# Patient Record
Sex: Female | Born: 1970
Health system: Southern US, Community
[De-identification: ages and names within clinical notes are randomized; demographics above are authoritative.]

## PROBLEM LIST (undated history)

## (undated) DIAGNOSIS — Z9889 Other specified postprocedural states: Secondary | ICD-10-CM

## (undated) DIAGNOSIS — R112 Nausea with vomiting, unspecified: Secondary | ICD-10-CM

## (undated) DIAGNOSIS — T4145XA Adverse effect of unspecified anesthetic, initial encounter: Secondary | ICD-10-CM

## (undated) DIAGNOSIS — M1611 Unilateral primary osteoarthritis, right hip: Secondary | ICD-10-CM

## (undated) DIAGNOSIS — F32A Depression, unspecified: Secondary | ICD-10-CM

## (undated) DIAGNOSIS — K219 Gastro-esophageal reflux disease without esophagitis: Secondary | ICD-10-CM

## (undated) DIAGNOSIS — E559 Vitamin D deficiency, unspecified: Secondary | ICD-10-CM

## (undated) DIAGNOSIS — G43909 Migraine, unspecified, not intractable, without status migrainosus: Secondary | ICD-10-CM

## (undated) DIAGNOSIS — J45909 Unspecified asthma, uncomplicated: Secondary | ICD-10-CM

## (undated) DIAGNOSIS — F329 Major depressive disorder, single episode, unspecified: Secondary | ICD-10-CM

## (undated) DIAGNOSIS — D509 Iron deficiency anemia, unspecified: Secondary | ICD-10-CM

## (undated) DIAGNOSIS — F419 Anxiety disorder, unspecified: Secondary | ICD-10-CM

## (undated) DIAGNOSIS — M1990 Unspecified osteoarthritis, unspecified site: Secondary | ICD-10-CM

## (undated) DIAGNOSIS — K635 Polyp of colon: Secondary | ICD-10-CM

## (undated) DIAGNOSIS — M51369 Other intervertebral disc degeneration, lumbar region without mention of lumbar back pain or lower extremity pain: Secondary | ICD-10-CM

## (undated) DIAGNOSIS — D649 Anemia, unspecified: Secondary | ICD-10-CM

## (undated) DIAGNOSIS — T8859XA Other complications of anesthesia, initial encounter: Secondary | ICD-10-CM

## (undated) HISTORY — DX: Depression, unspecified: F32.A

## (undated) HISTORY — PX: ABDOMINAL HYSTERECTOMY: SHX81

## (undated) HISTORY — DX: Major depressive disorder, single episode, unspecified: F32.9

## (undated) HISTORY — DX: Morbid (severe) obesity due to excess calories: E66.01

## (undated) HISTORY — DX: Unspecified asthma, uncomplicated: J45.909

## (undated) HISTORY — PX: BREAST CYST ASPIRATION: SHX578

## (undated) HISTORY — PX: LEEP: SHX91

## (undated) HISTORY — PX: ABLATION: SHX5711

## (undated) HISTORY — DX: Unspecified osteoarthritis, unspecified site: M19.90

## (undated) HISTORY — PX: TOTAL VAGINAL HYSTERECTOMY: SHX2548

---
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
## 2003-01-30 HISTORY — PX: LEEP: SHX91

## 2004-10-24 ENCOUNTER — Ambulatory Visit: Payer: Self-pay

## 2006-01-15 ENCOUNTER — Ambulatory Visit: Payer: Self-pay

## 2008-01-30 HISTORY — PX: SUPRACERVICAL ABDOMINAL HYSTERECTOMY: SHX5393

## 2008-09-01 ENCOUNTER — Ambulatory Visit: Payer: Self-pay

## 2008-09-09 ENCOUNTER — Ambulatory Visit: Payer: Self-pay

## 2008-09-12 ENCOUNTER — Emergency Department: Payer: Self-pay | Admitting: Internal Medicine

## 2011-08-07 ENCOUNTER — Emergency Department: Payer: Self-pay | Admitting: Internal Medicine

## 2011-08-07 LAB — CBC WITH DIFFERENTIAL/PLATELET
Basophil #: 0.1 10*3/uL (ref 0.0–0.1)
Basophil %: 0.6 %
Eosinophil #: 1 10*3/uL — ABNORMAL HIGH (ref 0.0–0.7)
Eosinophil %: 10.9 %
HCT: 37.4 % (ref 35.0–47.0)
HGB: 12.6 g/dL (ref 12.0–16.0)
Lymphocyte #: 2.6 10*3/uL (ref 1.0–3.6)
Lymphocyte %: 27.1 %
MCH: 28.9 pg (ref 26.0–34.0)
MCHC: 33.7 g/dL (ref 32.0–36.0)
MCV: 86 fL (ref 80–100)
Monocyte #: 0.6 x10 3/mm (ref 0.2–0.9)
Monocyte %: 6.1 %
Neutrophil #: 5.3 10*3/uL (ref 1.4–6.5)
Neutrophil %: 55.3 %
Platelet: 220 10*3/uL (ref 150–440)
RBC: 4.36 10*6/uL (ref 3.80–5.20)
RDW: 13.8 % (ref 11.5–14.5)
WBC: 9.6 10*3/uL (ref 3.6–11.0)

## 2011-08-07 LAB — BASIC METABOLIC PANEL
Anion Gap: 8 (ref 7–16)
BUN: 14 mg/dL (ref 7–18)
Calcium, Total: 9.4 mg/dL (ref 8.5–10.1)
Chloride: 106 mmol/L (ref 98–107)
Co2: 27 mmol/L (ref 21–32)
Creatinine: 0.63 mg/dL (ref 0.60–1.30)
EGFR (African American): >60
EGFR (Non-African Amer.): >60
Glucose: 88 mg/dL (ref 65–99)
Osmolality: 281 (ref 275–301)
Potassium: 3.7 mmol/L (ref 3.5–5.1)
Sodium: 141 mmol/L (ref 136–145)

## 2012-06-18 ENCOUNTER — Ambulatory Visit: Payer: Self-pay

## 2012-06-18 LAB — HM MAMMOGRAPHY: HM Mammogram: NORMAL

## 2013-11-17 ENCOUNTER — Ambulatory Visit (INDEPENDENT_AMBULATORY_CARE_PROVIDER_SITE_OTHER): Payer: BC Managed Care – PPO | Admitting: Podiatry

## 2013-11-17 ENCOUNTER — Encounter: Payer: Self-pay | Admitting: Podiatry

## 2013-11-17 ENCOUNTER — Ambulatory Visit (INDEPENDENT_AMBULATORY_CARE_PROVIDER_SITE_OTHER): Payer: BC Managed Care – PPO

## 2013-11-17 VITALS — BP 112/78 | HR 66 | Resp 16 | Ht 59.0 in | Wt 175.0 lb

## 2013-11-17 DIAGNOSIS — S93401A Sprain of unspecified ligament of right ankle, initial encounter: Secondary | ICD-10-CM

## 2013-11-17 DIAGNOSIS — R609 Edema, unspecified: Secondary | ICD-10-CM

## 2013-11-17 DIAGNOSIS — M779 Enthesopathy, unspecified: Secondary | ICD-10-CM

## 2013-11-17 MED ORDER — HYDROCODONE-ACETAMINOPHEN 10-325 MG PO TABS
1.0000 | ORAL_TABLET | Freq: Three times a day (TID) | ORAL | Status: DC | PRN
Start: 2013-11-17 — End: 2014-08-11

## 2013-11-17 MED ORDER — DICLOFENAC SODIUM 75 MG PO TBEC: 75.0000 mg | DELAYED_RELEASE_TABLET | Freq: Two times a day (BID) | ORAL | Status: DC

## 2013-11-17 NOTE — Progress Notes (Signed)
Subjective:     Patient ID: Shelby Henson, female   DOB: [birth year], 43 y.o.   MRN: [id number]  Foot Pain   patient presents stating I'm having a lot of pain in my right ankle after spraining my ankle 2 weeks ago. Stated that she turned it and that not only did her ankle her but she got bruising in her toes and those are still bothering her   Review of Systems  All other systems reviewed and are negative.      Objective:   Physical Exam  Nursing note and vitals reviewed. Constitutional: She is oriented to person, place, and time.  Cardiovascular: Intact distal pulses.   Musculoskeletal: Normal range of motion.  Neurological: She is oriented to person, place, and time.  Skin: Skin is warm.   neurovascular found to be intact with muscle strength adequate and range of motion of the subtalar and midtarsal joint within normal limits. Patient is not appear to have excessive inversion currently but has a lot of pain in the anterior talofibular ligament the calcaneofibular ligament and moderate discomfort in the forefoot right. Patient is not appear to have excessive edema at this time did have a negative Homans sign noted     Assessment:     Severe sprained ankle right with inflammatory changes and inability to bear proper weight with compensation noted    Plan:     H&P and x-rays reviewed. Today I applied a cam walker to completely immobilize and rest of the foot and ankle and advised on wearing this for the next 3 weeks and hopefully we can put her into a brace at that time. Also dispensed Ace wrap with instructions on usage and reappoint 3 weeks to reevaluate

## 2013-11-17 NOTE — Progress Notes (Signed)
   Subjective:    Patient ID: Shelby Henson, female    DOB: [birth year], 43 y.o.   MRN: [id number]  HPI Comments: [place] and twisted ankle two weeks ago and its no better   Foot Pain      Review of Systems  All other systems reviewed and are negative.      Objective:   Physical Exam        Assessment & Plan:

## 2013-12-11 ENCOUNTER — Ambulatory Visit (INDEPENDENT_AMBULATORY_CARE_PROVIDER_SITE_OTHER): Payer: BC Managed Care – PPO

## 2013-12-11 ENCOUNTER — Ambulatory Visit (INDEPENDENT_AMBULATORY_CARE_PROVIDER_SITE_OTHER): Payer: BC Managed Care – PPO | Admitting: Podiatry

## 2013-12-11 VITALS — BP 112/60 | HR 66 | Resp 16

## 2013-12-11 DIAGNOSIS — S93401D Sprain of unspecified ligament of right ankle, subsequent encounter: Secondary | ICD-10-CM

## 2013-12-11 MED ORDER — HYDROCODONE-ACETAMINOPHEN 10-325 MG PO TABS: 1.0000 | ORAL_TABLET | Freq: Three times a day (TID) | ORAL | Status: DC | PRN

## 2013-12-13 NOTE — Progress Notes (Signed)
Subjective:     Patient ID: Shelby Henson, female   DOB: August 27, 1970, 43 y.o.   MRN: [id number]  HPIpatient states my ankle is still burning some but it has improved and it's still feels moderately unstable. I'm much better in the boot   Review of Systems     Objective:   Physical Exam Neurovascular status intact muscle strength adequate with moderate inversion of the right ankle and continued discomfort in the anterior talofibular ligament and the calcaneofibular ligament when palpated    Assessment:     Ankle sprain right that is improving but is still quite tender especially with weightbearing    Plan:     I did do a final x-ray series the right ankle and reviewed with patient and today I did dispense a Tri-Lock ankle brace and instructed on utilizing the Tri-Lock at times and then the boot at other times

## 2014-01-08 ENCOUNTER — Ambulatory Visit (INDEPENDENT_AMBULATORY_CARE_PROVIDER_SITE_OTHER): Payer: BC Managed Care – PPO | Admitting: Podiatry

## 2014-01-08 ENCOUNTER — Encounter: Payer: Self-pay | Admitting: Podiatry

## 2014-01-08 DIAGNOSIS — S93401D Sprain of unspecified ligament of right ankle, subsequent encounter: Secondary | ICD-10-CM

## 2014-01-09 NOTE — Progress Notes (Signed)
Subjective:     Patient ID: Shelby Henson, female   DOB: March 23, 1970, 43 y.o.   MRN: [id number]  HPI patient states her ankle continues to improve with moderate discomfort when she's been on it for extended periods of time   Review of Systems     Objective:   Physical Exam Neurovascular status intact with improved range of motion of the right ankle and minimal edema noted and only mild discomfort upon palpation    Assessment:     Significant improvement from severe ankle sprain right    Plan:     Advised on continued brace usage and gave instructions on proprioception exercises to build up the strength around the ankle with gradual reduction in the brace over the next 2 months

## 2014-07-06 ENCOUNTER — Encounter: Payer: Self-pay | Admitting: Emergency Medicine

## 2014-07-06 ENCOUNTER — Emergency Department
Admission: EM | Admit: 2014-07-06 | Discharge: 2014-07-06 | Disposition: A | Discharge status: Home / Self Care | Payer: No Typology Code available for payment source | Attending: Emergency Medicine | Admitting: Emergency Medicine

## 2014-07-06 DIAGNOSIS — Z880 Allergy status to penicillin: Secondary | ICD-10-CM | POA: Diagnosis not present

## 2014-07-06 DIAGNOSIS — X58XXXA Exposure to other specified factors, initial encounter: Secondary | ICD-10-CM | POA: Diagnosis not present

## 2014-07-06 DIAGNOSIS — G8929 Other chronic pain: Secondary | ICD-10-CM | POA: Diagnosis not present

## 2014-07-06 DIAGNOSIS — S39012A Strain of muscle, fascia and tendon of lower back, initial encounter: Secondary | ICD-10-CM | POA: Diagnosis not present

## 2014-07-06 DIAGNOSIS — Z79899 Other long term (current) drug therapy: Secondary | ICD-10-CM | POA: Diagnosis not present

## 2014-07-06 DIAGNOSIS — Y998 Other external cause status: Secondary | ICD-10-CM | POA: Insufficient documentation

## 2014-07-06 DIAGNOSIS — Y9289 Other specified places as the place of occurrence of the external cause: Secondary | ICD-10-CM | POA: Insufficient documentation

## 2014-07-06 DIAGNOSIS — Y9389 Activity, other specified: Secondary | ICD-10-CM | POA: Insufficient documentation

## 2014-07-06 DIAGNOSIS — Z791 Long term (current) use of non-steroidal anti-inflammatories (NSAID): Secondary | ICD-10-CM | POA: Insufficient documentation

## 2014-07-06 DIAGNOSIS — M25551 Pain in right hip: Secondary | ICD-10-CM | POA: Insufficient documentation

## 2014-07-06 HISTORY — DX: Migraine, unspecified, not intractable, without status migrainosus: G43.909

## 2014-07-06 MED ORDER — KETOROLAC TROMETHAMINE 60 MG/2ML IM SOLN
INTRAMUSCULAR | Status: AC
  Administered 2014-07-06: 60 mg via INTRAMUSCULAR
  Filled 2014-07-06: qty 2

## 2014-07-06 MED ORDER — HYDROCODONE-ACETAMINOPHEN 5-325 MG PO TABS: 1.0000 | ORAL_TABLET | ORAL | Status: DC | PRN

## 2014-07-06 MED ORDER — PREDNISONE 10 MG PO TABS: 10.0000 mg | ORAL_TABLET | Freq: Two times a day (BID) | ORAL | Status: DC

## 2014-07-06 MED ORDER — DIAZEPAM 2 MG PO TABS: 2.0000 mg | ORAL_TABLET | Freq: Three times a day (TID) | ORAL | Status: DC | PRN

## 2014-07-06 MED ORDER — KETOROLAC TROMETHAMINE 60 MG/2ML IM SOLN
60.0000 mg | Freq: Once | INTRAMUSCULAR | Status: AC
  Administered 2014-07-06: 60 mg via INTRAMUSCULAR

## 2014-07-06 NOTE — ED Notes (Signed)
Pt presents to ED with right hip pain for approx 1 month and was seen by pcp and ortho. Pt states she was given steroid injection, pain medication, and told to return in 2 months. Pt states pain medication is making her nauseated and she is unable to sleep due to her pain. Pt states she is wanting something else done for her pain because she "cant wait" until her next appt to get relief.

## 2014-07-06 NOTE — ED Provider Notes (Signed)
[facility] Emergency Department Provider Note ____________________________________________  Time seen: 2159  I have reviewed the triage vital signs and the nursing notes.  HISTORY  Chief Complaint Hip Pain  HPI Shelby Henson is a 44 y.o. female who reports to the ED with complaints of chronic hip pain for months. She is been treated previously by her primary care provider with a right bursitis steroid injection. And then referred to Dr. Sharyne Richters for further evaluation. He diagnosed her with a pinched lumbar nerve and some hip joint arthropathy. He previously prescribed oxycodone for her and she has since completed that regimen. She claims she has had a difficult time getting rescheduled with his office here at [facility], noting that his primary practice location is in their [place] office. She denies any recent injury, fall, leg weakness, foot drop or give way. She describes increased pain to the right buttocks and thigh, that is not relieved with Tylenol and Motrin or her Flexeril.  Past Medical History  Diagnosis Date  . Migraine    There are no active problems to display for this patient.  Past Surgical History  Procedure Laterality Date  . Abdominal hysterectomy    . Cesarean section    . Cesarean section     Current Outpatient Rx  Name  Route  Sig  Dispense  Refill  . desvenlafaxine (PRISTIQ) 100 MG 24 hr tablet   Oral   Take 100 mg by mouth daily.         . diazepam (VALIUM) 2 MG tablet   Oral   Take 1 tablet (2 mg total) by mouth every 8 (eight) hours as needed for muscle spasms.   10 tablet   0   . diclofenac (VOLTAREN) 75 MG EC tablet   Oral   Take 1 tablet (75 mg total) by mouth 2 (two) times daily.   50 tablet   2   . HYDROcodone-acetaminophen (NORCO) 10-325 MG per tablet   Oral   Take 1 tablet by mouth every 8 (eight) hours as needed.   30 tablet   0   . HYDROcodone-acetaminophen (NORCO) 10-325 MG per tablet   Oral  Take 1 tablet by mouth every 8 (eight) hours as needed.   30 tablet   0   . HYDROcodone-acetaminophen (NORCO) 5-325 MG per tablet   Oral   Take 1 tablet by mouth every 4 (four) hours as needed for moderate pain.   6 tablet   0   . predniSONE (DELTASONE) 10 MG tablet   Oral   Take 1 tablet (10 mg total) by mouth 2 (two) times daily with a meal.   10 tablet   0    Allergies Codeine and Penicillins  No family history on file.  Social History History  Substance Use Topics  . Smoking status: Never Smoker   . Smokeless tobacco: Never Used  . Alcohol Use: No   Review of Systems  Constitutional: Negative for fever. Eyes: Negative for visual changes. ENT: Negative for sore throat. Cardiovascular: Negative for chest pain. Respiratory: Negative for shortness of breath. Gastrointestinal: Negative for abdominal pain, vomiting and diarrhea. Genitourinary: Negative for dysuria, incontinence Musculoskeletal: Positive for right low back pain and hip pain. Skin: Negative for rash. Neurological: Negative for headaches, focal weakness or numbness. ____________________________________________  PHYSICAL EXAM:  VITAL SIGNS: ED Triage Vitals  Enc Vitals Group     BP 07/06/14 2039 129/78 mmHg     Pulse Rate 07/06/14 2039 83  Resp 07/06/14 2039 18     Temp 07/06/14 2039 97.8 F (36.6 C)     Temp Source 07/06/14 2039 Oral     SpO2 07/06/14 2039 97 %     Weight 07/06/14 2039 165 lb (74.844 kg)     Height 07/06/14 2039 4\' 11"  (1.499 m)     Head Cir --      Peak Flow --      Pain Score 07/06/14 2039 8     Pain Loc --      Pain Edu? --      Excl. in [place]? --    Constitutional: Alert and oriented. Well appearing and in no distress. Eyes: Conjunctivae are normal. PERRL. Normal extraocular movements. ENT   Head: Normocephalic and atraumatic.   Nose: No congestion/rhinnorhea.   Mouth/Throat: Mucous membranes are moist.   Neck: No stridor. Cardiovascular: Normal rate,  regular rhythm.  Respiratory: Normal respiratory effort. No wheezes/rales/rhonchi. Gastrointestinal: Soft and nontender. No distention. Musculoskeletal: Nontender with normal range of motion in all extremities. No lower extremity tenderness nor edema. Full right hip ROM without catch, click,or lock Neurologic:  Normal speech and language. No gross focal neurologic deficits are appreciated. Normal DTRs LE bilaterally. Negative SLR and toe dorsiflexion bilaterally. Skin:  Skin is warm, dry and intact. No rash noted. Psychiatric: Mood and affect are normal. Patient exhibits appropriate insight and judgment. ____________________________________________  PROCEDURES Toradol 60 mg IM ____________________________________________  INITIAL IMPRESSION / ASSESSMENT AND PLAN / ED COURSE Chronic LBP with RLE referral. Prescription steroid, valium, and Norco for pain relief. Patient must follow-up with primary provider if unable to secure return visit with ortho specialist. ____________________________________________  FINAL CLINICAL IMPRESSION(S) / ED DIAGNOSES  Final diagnoses:  Hip pain, chronic, right  Strain, lumbosacral, initial encounter     Melvenia Needles, PA-C 07/06/14 2344  Delman Kitten, MD 07/06/14 2357

## 2014-07-06 NOTE — Discharge Instructions (Signed)
Chronic Back Pain  When back pain lasts longer than 3 months, it is called chronic back pain.People with chronic back pain often go through certain periods that are more intense (flare-ups).  CAUSES Chronic back pain can be caused by wear and tear (degeneration) on different structures in your back. These structures include:  The bones of your spine (vertebrae) and the joints surrounding your spinal cord and nerve roots (facets).  The strong, fibrous tissues that connect your vertebrae (ligaments). Degeneration of these structures may result in pressure on your nerves. This can lead to constant pain. HOME CARE INSTRUCTIONS  Avoid bending, heavy lifting, prolonged sitting, and activities which make the problem worse.  Take brief periods of rest throughout the day to reduce your pain. Lying down or standing usually is better than sitting while you are resting.  Take over-the-counter or prescription medicines only as directed by your caregiver. SEEK IMMEDIATE MEDICAL CARE IF:   You have weakness or numbness in one of your legs or feet.  You have trouble controlling your bladder or bowels.  You have nausea, vomiting, abdominal pain, shortness of breath, or fainting. Document Released: 02/23/2004 Document Revised: 04/09/2011 Document Reviewed: 12/30/2010 [facility] Patient Information 2015 [place], [place]. This information is not intended to replace advice given to you by your health care provider. Make sure you discuss any questions you have with your health care provider.  Hip Pain Your hip is the joint between your upper legs and your lower pelvis. The bones, cartilage, tendons, and muscles of your hip joint perform a lot of work each day supporting your body weight and allowing you to move around. Hip pain can range from a minor ache to severe pain in one or both of your hips. Pain may be felt on the inside of the hip joint near the groin, or the outside near the buttocks and upper thigh.  You may have swelling or stiffness as well.  HOME CARE INSTRUCTIONS   Take medicines only as directed by your health care provider.  Apply ice to the injured area:  Put ice in a plastic bag.  Place a towel between your skin and the bag.  Leave the ice on for 15-20 minutes at a time, 3-4 times a day.  Keep your leg raised (elevated) when possible to lessen swelling.  Avoid activities that cause pain.  Follow specific exercises as directed by your health care provider.  Sleep with a pillow between your legs on your most comfortable side.  Record how often you have hip pain, the location of the pain, and what it feels like. SEEK MEDICAL CARE IF:   You are unable to put weight on your leg.  Your hip is red or swollen or very tender to touch.  Your pain or swelling continues or worsens after 1 week.  You have increasing difficulty walking.  You have a fever. SEEK IMMEDIATE MEDICAL CARE IF:   You have fallen.  You have a sudden increase in pain and swelling in your hip. MAKE SURE YOU:   Understand these instructions.  Will watch your condition.  Will get help right away if you are not doing well or get worse. Document Released: 07/05/2009 Document Revised: 06/01/2013 Document Reviewed: 09/11/2012 [facility] Patient Information 2015 [place], [place]. This information is not intended to replace advice given to you by your health care provider. Make sure you discuss any questions you have with your health care provider.  Take the prescription meds as directed.  Follow-up with Dr.  Dimmig or Dr. Ancil Boozer for ongoing care.

## 2014-07-07 ENCOUNTER — Encounter: Payer: Self-pay | Admitting: Family Medicine

## 2014-07-09 ENCOUNTER — Other Ambulatory Visit: Payer: Self-pay | Admitting: Family Medicine

## 2014-07-09 NOTE — Telephone Encounter (Signed)
Patient needs refill

## 2014-08-08 ENCOUNTER — Encounter: Payer: Self-pay | Admitting: Family Medicine

## 2014-08-08 DIAGNOSIS — Q760 Spina bifida occulta: Secondary | ICD-10-CM | POA: Insufficient documentation

## 2014-08-08 DIAGNOSIS — F5104 Psychophysiologic insomnia: Secondary | ICD-10-CM | POA: Insufficient documentation

## 2014-08-08 DIAGNOSIS — Z90710 Acquired absence of both cervix and uterus: Secondary | ICD-10-CM | POA: Insufficient documentation

## 2014-08-08 DIAGNOSIS — M549 Dorsalgia, unspecified: Secondary | ICD-10-CM | POA: Insufficient documentation

## 2014-08-08 DIAGNOSIS — M1611 Unilateral primary osteoarthritis, right hip: Secondary | ICD-10-CM | POA: Insufficient documentation

## 2014-08-08 DIAGNOSIS — F39 Unspecified mood [affective] disorder: Secondary | ICD-10-CM | POA: Insufficient documentation

## 2014-08-08 DIAGNOSIS — K219 Gastro-esophageal reflux disease without esophagitis: Secondary | ICD-10-CM | POA: Insufficient documentation

## 2014-08-08 DIAGNOSIS — J4520 Mild intermittent asthma, uncomplicated: Secondary | ICD-10-CM | POA: Insufficient documentation

## 2014-08-08 DIAGNOSIS — K580 Irritable bowel syndrome with diarrhea: Secondary | ICD-10-CM | POA: Insufficient documentation

## 2014-08-08 DIAGNOSIS — M5136 Other intervertebral disc degeneration, lumbar region: Secondary | ICD-10-CM | POA: Insufficient documentation

## 2014-08-08 DIAGNOSIS — J4521 Mild intermittent asthma with (acute) exacerbation: Secondary | ICD-10-CM | POA: Insufficient documentation

## 2014-08-08 DIAGNOSIS — IMO0002 Reserved for concepts with insufficient information to code with codable children: Secondary | ICD-10-CM | POA: Insufficient documentation

## 2014-08-11 ENCOUNTER — Ambulatory Visit (INDEPENDENT_AMBULATORY_CARE_PROVIDER_SITE_OTHER): Payer: No Typology Code available for payment source | Admitting: Family Medicine

## 2014-08-11 ENCOUNTER — Encounter: Payer: Self-pay | Admitting: Family Medicine

## 2014-08-11 ENCOUNTER — Encounter (INDEPENDENT_AMBULATORY_CARE_PROVIDER_SITE_OTHER): Payer: Self-pay

## 2014-08-11 VITALS — BP 108/64 | HR 79 | Temp 98.8°F | Resp 16 | Ht 59.0 in | Wt 178.1 lb

## 2014-08-11 DIAGNOSIS — M1610 Unilateral primary osteoarthritis, unspecified hip: Secondary | ICD-10-CM | POA: Diagnosis not present

## 2014-08-11 DIAGNOSIS — J4520 Mild intermittent asthma, uncomplicated: Secondary | ICD-10-CM

## 2014-08-11 DIAGNOSIS — F329 Major depressive disorder, single episode, unspecified: Secondary | ICD-10-CM | POA: Diagnosis not present

## 2014-08-11 DIAGNOSIS — G4700 Insomnia, unspecified: Secondary | ICD-10-CM

## 2014-08-11 DIAGNOSIS — F32A Depression, unspecified: Secondary | ICD-10-CM

## 2014-08-11 DIAGNOSIS — K219 Gastro-esophageal reflux disease without esophagitis: Secondary | ICD-10-CM | POA: Diagnosis not present

## 2014-08-11 DIAGNOSIS — F5104 Psychophysiologic insomnia: Secondary | ICD-10-CM

## 2014-08-11 MED ORDER — VENLAFAXINE HCL ER 150 MG PO CP24: 150.0000 mg | ORAL_CAPSULE | Freq: Every day | ORAL | Status: DC

## 2014-08-11 NOTE — Progress Notes (Signed)
Name: Shelby Henson   MRN: [id number]    DOB: [birth year]   Date:08/11/2014       Progress Note  Subjective  Chief Complaint  Chief Complaint  Patient presents with  . Depression  . Insomnia    avg sleep 2-3hrs  . Hip Pain    right seeing Ortho    HPI  Depression: she has been taking Pritiq but was getting for free through [employer], but now she has insurance and needs to get a generic medication. She states she is in remission.  Denies mood swings, crying spells, or anhedonia  Insomnia: she is taking Trazodone prn only , only when she off from work, because she works 2nd shift and cares for grandson during the day.  Right Hip OA: she is seeing Dr. Joselyn Arrow at [facility], she is having daily pain, that affects her sleep.  Hydrocodone helps with the pain slightly.  Pain level at this time is 6/10.  She was supposed to have a steroid injection but insurance is denying coverage.    GERD: she is only taking medication prn, about 3 times weekly. Symptoms are usually epigastric pain, no nausea, symptoms triggered by poor food choices.  Asthma Mild Intermittent: she has not used rescue inhaler in about one year. Denies SOB , wheezing or cough   Patient Active Problem List   Diagnosis Date Noted  . Asthma, mild intermittent 08/08/2014  . Chronic insomnia 08/08/2014  . Back ache 08/08/2014  . DDD (degenerative disc disease), lumbar 08/08/2014  . Clinical depression 08/08/2014  . Gastro-esophageal reflux disease without esophagitis 08/08/2014  . H/O total hysterectomy 08/08/2014  . Irritable bowel syndrome with diarrhea 08/08/2014  . Degenerative arthritis of hip 08/08/2014  . Adult BMI 30+ 08/08/2014  . SBO (spina bifida occulta) 08/08/2014    Past Surgical History  Procedure Laterality Date  . Abdominal hysterectomy    . Cesarean section    . Cesarean section      Family History  Problem Relation Age of Onset  . Bipolar disorder Brother     History   Social History  .  Marital Status: Divorced    Spouse Name: N/A  . Number of Children: N/A  . Years of Education: N/A   Occupational History  . Not on file.   Social History Main Topics  . Smoking status: Never Smoker   . Smokeless tobacco: Never Used  . Alcohol Use: 0.0 oz/week    0 Standard drinks or equivalent per week     Comment: occasionally  . Drug Use: Not on file  . Sexual Activity: Not Currently   Other Topics Concern  . Not on file   Social History Narrative     Current outpatient prescriptions:  .  albuterol (PROVENTIL) (2.5 MG/3ML) 0.083% nebulizer solution, Inhale into the lungs., Disp: , Rfl:  .  HYDROcodone-acetaminophen (NORCO) 5-325 MG per tablet, Take 1 tablet by mouth every 4 (four) hours as needed for moderate pain., Disp: 6 tablet, Rfl: 0 .  ranitidine (ZANTAC) 150 MG tablet, Take 1 tablet by mouth daily., Disp: , Rfl:  .  traZODone (DESYREL) 50 MG tablet, Take 1 tablet by mouth at bedtime., Disp: , Rfl:  .  venlafaxine XR (EFFEXOR XR) 150 MG 24 hr capsule, Take 1 capsule (150 mg total) by mouth daily with breakfast., Disp: 30 capsule, Rfl: 2  Allergies  Allergen Reactions  . Prednisone     intolerance, makes her angry. Makes her angry  . Zolpidem   .  Codeine Rash  . Penicillins Rash     ROS  Constitutional: Negative for fever or weight change.  Respiratory: Negative for cough and shortness of breath.   Cardiovascular: Negative for chest pain or palpitations.  Gastrointestinal: Negative for abdominal pain, no bowel changes.  Musculoskeletal: Positive  for gait problem and  joint pain Skin: Negative for rash.  Neurological: Negative for dizziness or headache.  No other specific complaints in a complete review of systems (except as listed in HPI above).  Objective  Filed Vitals:   08/11/14 0834  BP: 108/64  Pulse: 79  Temp: 98.8 F (37.1 C)  TempSrc: Oral  Resp: 16  Height: 4\' 11"  (1.499 m)  Weight: 178 lb 1.6 oz (80.786 kg)  SpO2: 96%    Body mass  index is 35.95 kg/(m^2).  Physical Exam  Constitutional: Patient appears well-developed and well-nourished. No distress. Obesity   Eyes:  No scleral icterus. PERL Neck: Normal range of motion. Neck supple. Cardiovascular: Normal rate, regular rhythm and normal heart sounds.  No murmur heard. No BLE edema. Pulmonary/Chest: Effort normal and breath sounds normal. No respiratory distress. Abdominal: Soft.  There is no tenderness. Psychiatric: Patient has a normal mood and affect. behavior is normal. Judgment and thought content normal. Muscular Skeletal: pain during internal rotation of hip - on her groin.  Pain also during palpation of right outer hip    PHQ2/9: Depression screen [facility] 2/9 08/11/2014  Decreased Interest 0  Down, Depressed, Hopeless 0  PHQ - 2 Score 0     Fall Risk: Fall Risk  08/11/2014  Falls in the past year? Yes  Number falls in past yr: 1  Injury with Fall? Yes      Assessment & Plan  1. Chronic insomnia Continue prn medication   2. Gastro-esophageal reflux disease without esophagitis Stable with dietary modification and prn medication  3. Clinical depression We will stop Pritiq because of cost and we will try Effexor - venlafaxine XR (EFFEXOR XR) 150 MG 24 hr capsule; Take 1 capsule (150 mg total) by mouth daily with breakfast.  Dispense: 30 capsule; Refill: 2  5. Asthma, mild intermittent, uncomplicated Doing well at this time  6. Primary osteoarthritis of one hip Continue follow up with Ortho, advised strength training - quad exercises

## 2014-08-11 NOTE — Patient Instructions (Signed)
Hip Exercises RANGE OF MOTION (ROM) AND STRETCHING EXERCISES  These exercises may help you when beginning to rehabilitate your injury. Doing them too aggressively can worsen your condition. Complete them slowly and gently. Your symptoms may resolve with or without further involvement from your physician, physical therapist or athletic trainer. While completing these exercises, remember:   Restoring tissue flexibility helps normal motion to return to the joints. This allows healthier, less painful movement and activity.  An effective stretch should be held for at least 30 seconds.  A stretch should never be painful. You should only feel a gentle lengthening or release in the stretched tissue. If these stretches worsen your symptoms even when done gently, consult your physician, physical therapist or athletic trainer. STRETCH - Hamstrings, Supine   Lie on your back. Loop a belt or towel over the ball of your right / left foot.  Straighten your right / left knee and slowly pull on the belt to raise your leg. Do not allow the right / left knee to bend. Keep your opposite leg flat on the floor.  Raise the leg until you feel a gentle stretch behind your right / left knee or thigh. Hold this position for __________ seconds. Repeat __________ times. Complete this stretch __________ times per day.  STRETCH - Hip Rotators   Lie on your back on a firm surface. Grasp your right / left knee with your right / left hand and your ankle with your opposite hand.  Keeping your hips and shoulders firmly planted, gently pull your right / left knee and rotate your lower leg toward your opposite shoulder until you feel a stretch in your buttocks.  Hold this stretch for __________ seconds. Repeat this stretch __________ times. Complete this stretch __________ times per day. STRETCH - Hamstrings/Adductors, V-Sit   Sit on the floor with your legs extended in a large "V," keeping your knees straight.  With your  head and chest upright, bend at your waist reaching for your right foot to stretch your left adductors.  You should feel a stretch in your left inner thigh. Hold for __________ seconds.  Return to the upright position to relax your leg muscles.  Continuing to keep your chest upright, bend straight forward at your waist to stretch your hamstrings.  You should feel a stretch behind both of your thighs and/or knees. Hold for __________ seconds.  Return to the upright position to relax your leg muscles.  Repeat steps 2 through 4 for opposite leg. Repeat __________ times. Complete this exercise __________ times per day.  STRETCHING - Hip Flexors, Lunge  Half kneel with your right / left knee on the floor and your opposite knee bent and directly over your ankle.  Keep good posture with your head over your shoulders. Tighten your buttocks to point your tailbone downward; this will prevent your back from arching too much.  You should feel a gentle stretch in the front of your thigh and/or hip. If you do not feel any resistance, slightly slide your opposite foot forward and then slowly lunge forward so your knee once again lines up over your ankle. Be sure your tailbone remains pointed downward.  Hold this stretch for __________ seconds. Repeat __________ times. Complete this stretch __________ times per day. STRENGTHENING EXERCISES These exercises may help you when beginning to rehabilitate your injury. They may resolve your symptoms with or without further involvement from your physician, physical therapist or athletic trainer. While completing these exercises, remember:   Muscles  can gain both the endurance and the strength needed for everyday activities through controlled exercises.  Complete these exercises as instructed by your physician, physical therapist or athletic trainer. Progress the resistance and repetitions only as guided.  You may experience muscle soreness or fatigue, but the  pain or discomfort you are trying to eliminate should never worsen during these exercises. If this pain does worsen, stop and make certain you are following the directions exactly. If the pain is still present after adjustments, discontinue the exercise until you can discuss the trouble with your clinician. STRENGTH - Hip Extensors, Bridge   Lie on your back on a firm surface. Bend your knees and place your feet flat on the floor.  Tighten your buttocks muscles and lift your bottom off the floor until your trunk is level with your thighs. You should feel the muscles in your buttocks and back of your thighs working. If you do not feel these muscles, slide your feet 1-2 inches further away from your buttocks.  Hold this position for __________ seconds.  Slowly lower your hips to the starting position and allow your buttock muscles relax completely before beginning the next repetition.  If this exercise is too easy, you may cross your arms over your chest. Repeat __________ times. Complete this exercise __________ times per day.  STRENGTH - Hip Abductors, Straight Leg Raises  Be aware of your form throughout the entire exercise so that you exercise the correct muscles. Sloppy form means that you are not strengthening the correct muscles.  Lie on your side so that your head, shoulders, knee and hip line up. You may bend your lower knee to help maintain your balance. Your right / left leg should be on top.  Roll your hips slightly forward, so that your hips are stacked directly over each other and your right / left knee is facing forward.  Lift your top leg up 4-6 inches, leading with your heel. Be sure that your foot does not drift forward or that your knee does not roll toward the ceiling.  Hold this position for __________ seconds. You should feel the muscles in your outer hip lifting (you may not notice this until your leg begins to tire).  Slowly lower your leg to the starting position. Allow  the muscles to fully relax before beginning the next repetition. Repeat __________ times. Complete this exercise __________ times per day.  STRENGTH - Hip Adductors, Straight Leg Raises   Lie on your side so that your head, shoulders, knee and hip line up. You may place your upper foot in front to help maintain your balance. Your right / left leg should be on the bottom.  Roll your hips slightly forward, so that your hips are stacked directly over each other and your right / left knee is facing forward.  Tense the muscles in your inner thigh and lift your bottom leg 4-6 inches. Hold this position for __________ seconds.  Slowly lower your leg to the starting position. Allow the muscles to fully relax before beginning the next repetition. Repeat __________ times. Complete this exercise __________ times per day.  STRENGTH - Quadriceps, Straight Leg Raises  Quality counts! Watch for signs that the quadriceps muscle is working to insure you are strengthening the correct muscles and not "cheating" by substituting with healthier muscles.  Lay on your back with your right / left leg extended and your opposite knee bent.  Tense the muscles in the front of your right / left  thigh. You should see either your knee cap slide up or increased dimpling just above the knee. Your thigh may even quiver.  Tighten these muscles even more and raise your leg 4 to 6 inches off the floor. Hold for right / left seconds.  Keeping these muscles tense, lower your leg.  Relax the muscles slowly and completely in between each repetition. Repeat __________ times. Complete this exercise __________ times per day.  STRENGTH - Hip Abductors, Standing  Tie one end of a rubber exercise band/tubing to a secure surface (table, pole) and tie a loop at the other end.  Place the loop around your right / left ankle. Keeping your ankle with the band directly opposite of the secured end, step away until there is tension in the  tube/band.  Hold onto a chair as needed for balance.  Keeping your back upright, your shoulders over your hips, and your toes pointing forward, lift your right / left leg out to your side. Be sure to lift your leg with your hip muscles. Do not "throw" your leg or tip your body to lift your leg.  Slowly and with control, return to the starting position. Repeat exercise __________ times. Complete this exercise __________ times per day.  STRENGTH - Quadriceps, Squats  Stand in a door frame so that your feet and knees are in line with the frame.  Use your hands for balance, not support, on the frame.  Slowly lower your weight, bending at the hips and knees. Keep your lower legs upright so that they are parallel with the door frame. Squat only within the range that does not increase your knee pain. Never let your hips drop below your knees.  Slowly return upright, pushing with your legs, not pulling with your hands. Document Released: 02/02/2005 Document Revised: 04/09/2011 Document Reviewed: 04/29/2008 [facility] Patient Information 2015 [place], [place]. This information is not intended to replace advice given to you by your health care provider. Make sure you discuss any questions you have with your health care provider.

## 2014-09-13 ENCOUNTER — Encounter: Payer: Self-pay | Admitting: Family Medicine

## 2015-01-03 ENCOUNTER — Encounter: Payer: No Typology Code available for payment source | Admitting: Family Medicine

## 2015-04-26 LAB — HM MAMMOGRAPHY

## 2015-05-16 ENCOUNTER — Encounter: Payer: Self-pay | Admitting: Family Medicine

## 2015-05-17 ENCOUNTER — Encounter: Payer: Self-pay | Admitting: Family Medicine

## 2015-05-17 ENCOUNTER — Telehealth: Payer: Self-pay

## 2015-05-17 ENCOUNTER — Ambulatory Visit (INDEPENDENT_AMBULATORY_CARE_PROVIDER_SITE_OTHER): Payer: BLUE CROSS/BLUE SHIELD | Admitting: Family Medicine

## 2015-05-17 VITALS — BP 112/64 | HR 103 | Temp 98.1°F | Resp 18 | Ht 59.0 in | Wt 177.5 lb

## 2015-05-17 DIAGNOSIS — J302 Other seasonal allergic rhinitis: Secondary | ICD-10-CM | POA: Diagnosis not present

## 2015-05-17 DIAGNOSIS — Z131 Encounter for screening for diabetes mellitus: Secondary | ICD-10-CM | POA: Diagnosis not present

## 2015-05-17 DIAGNOSIS — Z0000 Encounter for general adult medical examination without abnormal findings: Secondary | ICD-10-CM | POA: Diagnosis not present

## 2015-05-17 DIAGNOSIS — J4521 Mild intermittent asthma with (acute) exacerbation: Secondary | ICD-10-CM

## 2015-05-17 DIAGNOSIS — N951 Menopausal and female climacteric states: Secondary | ICD-10-CM | POA: Diagnosis not present

## 2015-05-17 DIAGNOSIS — Z01419 Encounter for gynecological examination (general) (routine) without abnormal findings: Secondary | ICD-10-CM

## 2015-05-17 DIAGNOSIS — R5383 Other fatigue: Secondary | ICD-10-CM

## 2015-05-17 DIAGNOSIS — Z13220 Encounter for screening for lipoid disorders: Secondary | ICD-10-CM | POA: Diagnosis not present

## 2015-05-17 DIAGNOSIS — R232 Flushing: Secondary | ICD-10-CM

## 2015-05-17 MED ORDER — ALBUTEROL SULFATE (2.5 MG/3ML) 0.083% IN NEBU: 2.5000 mg | INHALATION_SOLUTION | Freq: Four times a day (QID) | RESPIRATORY_TRACT | Status: DC | PRN

## 2015-05-17 MED ORDER — FLUTICASONE PROPIONATE 50 MCG/ACT NA SUSP: 2.0000 | Freq: Every day | NASAL | Status: DC

## 2015-05-17 MED ORDER — LEVOCETIRIZINE DIHYDROCHLORIDE 5 MG PO TABS: 5.0000 mg | ORAL_TABLET | Freq: Every evening | ORAL | Status: DC

## 2015-05-17 NOTE — Telephone Encounter (Signed)
She asked for labs, so I will need to have [place] [facility] [place] before sending medication

## 2015-05-17 NOTE — Telephone Encounter (Signed)
Patient states Dr. Ancil Boozer and the patient discussed starting Estradiol and did not have it at her pharmacy. Could you please call this in for the patient? Thanks

## 2015-05-17 NOTE — Progress Notes (Signed)
Name: Shelby Henson   MRN: [id number]    DOB: [birth year]   Date:05/17/2015       Progress Note  Subjective  Chief Complaint  Chief Complaint  Patient presents with  . Annual Exam    Patient states Dr. Laurey Morale performed patient Pap Smear and Mammogram at [facility] 04/26/2015 and records were faxed over.     HPI  Well Woman : up to date with mammogram and pap smear - s/p hysterectomy - done by gyn since she still has a cervix. We will get a copy.  Fatigue: she continues to feel tired all the time, she also states not sleeping well since pain clinic advised her to stop trazodone when she started taking oxycodone.   Insomnia: she has difficulty staying asleep, she wakes up with pain on her right hip. Able to fall asleep without problems.   Chronic hip pain: currently seeing pain management [facility]. Taking Oxycodoone, denies sedation, she states no change in bowel movements, still has some constipation. Pain is described  As aching , dull pain, constant. Average pain is 7/10.   Hot Flashes: she had a hysterectomy in 09/13/2008 - she has noticed hot flashes, mood swings, breast tenderness. She would like to know if she is reaching menopause, she is s/p hysterectomy  Asthma Mild Intermittent: she states over the past few weeks , when AR got worse she has also noticed wheezing and occasional dry cough, not having nocturnal symptoms. She is out of albuterol and would like a refill. Singulair or HFA's did not work for her in the past.   AR: having a lot of symptoms, sneezing, rhinorrhea, nasal congestion, dry cough. She tried Sudafed and also Allegra without help. We will try a nasal spray.    Patient Active Problem List   Diagnosis Date Noted  . Allergic rhinitis, seasonal 05/17/2015  . Asthma, mild intermittent 08/08/2014  . Chronic insomnia 08/08/2014  . DDD (degenerative disc disease), lumbar 08/08/2014  . Gastro-esophageal reflux disease without esophagitis 08/08/2014  . H/O total  hysterectomy 08/08/2014  . Irritable bowel syndrome with diarrhea 08/08/2014  . Degenerative arthritis of hip 08/08/2014  . Adult BMI 30+ 08/08/2014  . SBO (spina bifida occulta) 08/08/2014    Past Surgical History  Procedure Laterality Date  . Abdominal hysterectomy    . Cesarean section    . Cesarean section      Family History  Problem Relation Age of Onset  . Bipolar disorder Brother   . Depression Mother     Social History   Social History  . Marital Status: Divorced    Spouse Name: N/A  . Number of Children: N/A  . Years of Education: N/A   Occupational History  . Not on file.   Social History Main Topics  . Smoking status: Never Smoker   . Smokeless tobacco: Never Used  . Alcohol Use: 0.0 oz/week    0 Standard drinks or equivalent per week     Comment: occasionally  . Drug Use: No  . Sexual Activity: Not Currently   Other Topics Concern  . Not on file   Social History Narrative     Current outpatient prescriptions:  .  albuterol (PROVENTIL) (2.5 MG/3ML) 0.083% nebulizer solution, Take 3 mLs (2.5 mg total) by nebulization every 6 (six) hours as needed for wheezing or shortness of breath., Disp: 75 mL, Rfl: 2 .  nabumetone (RELAFEN) 750 MG tablet, Take 750 mg by mouth 2 (two) times daily. , Disp: , Rfl:  .  oxyCODONE (OXY IR/ROXICODONE) 5 MG immediate release tablet, Take 5 mg by mouth 2 (two) times daily. , Disp: , Rfl:  .  ranitidine (ZANTAC) 150 MG tablet, Take 1 tablet by mouth daily., Disp: , Rfl:  .  fluticasone (FLONASE) 50 MCG/ACT nasal spray, Place 2 sprays into both nostrils daily., Disp: 16 g, Rfl: 2  Allergies  Allergen Reactions  . Prednisone     intolerance, makes her angry. Makes her angry  . Zolpidem   . Codeine Rash  . Penicillins Rash     ROS  Constitutional: Negative for fever or weight change.  Respiratory: Positive  for cough but no  shortness of breath.   Cardiovascular: Positive  for chest pain ( some tightness since  asthma flare but no palpitations.  Gastrointestinal: Negative for abdominal pain, no bowel changes.  Musculoskeletal: Positive for gait problem no joint swelling.  Skin: Negative for rash.  Neurological: Negative for dizziness or headache.  No other specific complaints in a complete review of systems (except as listed in HPI above).  Objective  Filed Vitals:   05/17/15 1348  BP: 112/64  Pulse: 103  Temp: 98.1 F (36.7 C)  TempSrc: Oral  Resp: 18  Height: 4\' 11"  (1.499 m)  Weight: 177 lb 8 oz (80.513 kg)  SpO2: 96%    Body mass index is 35.83 kg/(m^2).  Physical Exam  Constitutional: Patient appears well-developed and well-nourished. No distress.  HENT: Head: Normocephalic and atraumatic. Ears: B TMs ok, no erythema or effusion; Nose: Nose normal. Mouth/Throat: Oropharynx is clear and moist. No oropharyngeal exudate.  Eyes: Conjunctivae and EOM are normal. Pupils are equal, round, and reactive to light. No scleral icterus.  Neck: Normal range of motion. Neck supple. No JVD present. No thyromegaly present.  Cardiovascular: Normal rate, regular rhythm and normal heart sounds.  No murmur heard. No BLE edema. Pulmonary/Chest: Effort normal and breath sounds normal. No respiratory distress. Abdominal: Soft. Bowel sounds are normal, no distension. There is no tenderness. no masses Breast: no lumps or masses, no nipple discharge or rashes FEMALE GENITALIA:  Not done - recently done by gyn RECTAL: not done Musculoskeletal: Normal range of motion, no joint effusions. No gross deformities Neurological: he is alert and oriented to person, place, and time. No cranial nerve deficit. Coordination, balance, strength, speech and gait are normal.  Skin: Skin is warm and dry. No rash noted. No erythema.  Psychiatric: Patient has a normal mood and affect. behavior is normal. Judgment and thought content normal.  Recent Results (from the past 2160 hour(s))  HM MAMMOGRAPHY     Status: None    Collection Time: 04/26/15  4:30 PM  Result Value Ref Range   HM Mammogram 0-4 Bi-Rad 0-4 Bi-Rad, Self Reported Normal    Comment: category 2- performed at [facility]    PHQ2/9: Depression screen [facility] 2/9 05/17/2015 08/11/2014  Decreased Interest 0 0  Down, Depressed, Hopeless 0 0  PHQ - 2 Score 0 0     Fall Risk: Fall Risk  05/17/2015 08/11/2014  Falls in the past year? No Yes  Number falls in past yr: - 1  Injury with Fall? - Yes     Functional Status Survey: Is the patient deaf or have difficulty hearing?: No Does the patient have difficulty seeing, even when wearing glasses/contacts?: No Does the patient have difficulty concentrating, remembering, or making decisions?: No Does the patient have difficulty walking or climbing stairs?: No Does the patient have difficulty dressing or bathing?: No  Does the patient have difficulty doing errands alone such as visiting a doctor's office or shopping?: No    Assessment & Plan  1. Well woman exam  Discussed importance of 150 minutes of physical activity weekly, eat two servings of fish weekly, eat one serving of tree nuts ( cashews, pistachios, pecans, almonds.Marland Kitchen) every other day, eat 6 servings of fruit/vegetables daily and drink plenty of water and avoid sweet beverages.   2. Hot flashes  Discussed resuming Effexor, but she would like to hold off , she asked me about Estradiol, but we will check labs first  - [facility] - [place]  3. Asthma, mild intermittent, with acute exacerbation  - albuterol (PROVENTIL) (2.5 MG/3ML) 0.083% nebulizer solution; Take 3 mLs (2.5 mg total) by nebulization every 6 (six) hours as needed for wheezing or shortness of breath.  Dispense: 75 mL; Refill: 2  4. Lipid screening  - Lipid panel  5. Other fatigue  - Comprehensive metabolic panel - CBC with Differential/Platelet - TSH - Vitamin B12 - VITAMIN D 25 Hydroxy (Vit-D Deficiency, Fractures)  6. Diabetes mellitus screening  - Hemoglobin  A1c  7. Allergic rhinitis, seasonal  - fluticasone (FLONASE) 50 MCG/ACT nasal spray; Place 2 sprays into both nostrils daily.  Dispense: 16 g; Refill: 2 - levocetirizine (XYZAL) 5 MG tablet; Take 1 tablet (5 mg total) by mouth every evening.  Dispense: 30 tablet; Refill: 5

## 2015-05-21 LAB — LIPID PANEL
Chol/HDL Ratio: 5.8 ratio units — ABNORMAL HIGH (ref 0.0–4.4)
Cholesterol, Total: 202 mg/dL — ABNORMAL HIGH (ref 100–199)
HDL: 35 mg/dL — AB (ref >39)
LDL Calculated: 132 mg/dL — ABNORMAL HIGH (ref 0–99)
TRIGLYCERIDES: 177 mg/dL — AB (ref 0–149)
VLDL CHOLESTEROL CAL: 35 mg/dL (ref 5–40)

## 2015-05-21 LAB — CBC WITH DIFFERENTIAL/PLATELET
BASOS ABS: 0 10*3/uL (ref 0.0–0.2)
Basos: 0 %
EOS (ABSOLUTE): 1.1 10*3/uL — AB (ref 0.0–0.4)
Eos: 15 %
HEMOGLOBIN: 11.8 g/dL (ref 11.1–15.9)
Hematocrit: 35 % (ref 34.0–46.6)
IMMATURE GRANS (ABS): 0 10*3/uL (ref 0.0–0.1)
IMMATURE GRANULOCYTES: 0 %
Lymphocytes Absolute: 2.1 10*3/uL (ref 0.7–3.1)
Lymphs: 31 %
MCH: 27.9 pg (ref 26.6–33.0)
MCHC: 33.7 g/dL (ref 31.5–35.7)
MCV: 83 fL (ref 79–97)
Monocytes Absolute: 0.4 10*3/uL (ref 0.1–0.9)
Monocytes: 6 %
Neutrophils Absolute: 3.3 10*3/uL (ref 1.4–7.0)
Neutrophils: 48 %
PLATELETS: 241 10*3/uL (ref 150–379)
RBC: 4.23 x10E6/uL (ref 3.77–5.28)
RDW: 15.4 % (ref 12.3–15.4)
WBC: 6.9 10*3/uL (ref 3.4–10.8)

## 2015-05-21 LAB — COMPREHENSIVE METABOLIC PANEL
A/G RATIO: 1.4 (ref 1.2–2.2)
ALK PHOS: 89 IU/L (ref 39–117)
ALT: 22 IU/L (ref 0–32)
AST: 19 IU/L (ref 0–40)
Albumin: 4.3 g/dL (ref 3.5–5.5)
BUN/Creatinine Ratio: 28 — ABNORMAL HIGH (ref 9–23)
BUN: 16 mg/dL (ref 6–24)
Bilirubin Total: 0.4 mg/dL (ref 0.0–1.2)
CHLORIDE: 104 mmol/L (ref 96–106)
CO2: 21 mmol/L (ref 18–29)
Calcium: 9.2 mg/dL (ref 8.7–10.2)
Creatinine, Ser: 0.58 mg/dL (ref 0.57–1.00)
GFR calc non Af Amer: 111 mL/min/{1.73_m2} (ref >59)
GFR, EST AFRICAN AMERICAN: 128 mL/min/{1.73_m2} (ref >59)
GLOBULIN, TOTAL: 3.1 g/dL (ref 1.5–4.5)
Glucose: 91 mg/dL (ref 65–99)
Potassium: 4.9 mmol/L (ref 3.5–5.2)
SODIUM: 144 mmol/L (ref 134–144)
Total Protein: 7.4 g/dL (ref 6.0–8.5)

## 2015-05-21 LAB — HEMOGLOBIN A1C
Est. average glucose Bld gHb Est-mCnc: 120 mg/dL
HEMOGLOBIN A1C: 5.8 % — AB (ref 4.8–5.6)

## 2015-05-21 LAB — TSH: TSH: 1.1 u[IU]/mL (ref 0.450–4.500)

## 2015-05-21 LAB — FOLLICLE STIMULATING HORMONE: FSH: 7.5 m[IU]/mL

## 2015-05-21 LAB — VITAMIN B12: VITAMIN B 12: 314 pg/mL (ref 211–946)

## 2015-05-21 LAB — LUTEINIZING HORMONE: LH: 4.2 m[IU]/mL

## 2015-05-21 LAB — VITAMIN D 25 HYDROXY (VIT D DEFICIENCY, FRACTURES): VIT D 25 HYDROXY: 23 ng/mL — AB (ref 30.0–100.0)

## 2015-05-23 ENCOUNTER — Other Ambulatory Visit: Payer: Self-pay

## 2015-05-23 ENCOUNTER — Telehealth: Payer: Self-pay

## 2015-05-23 DIAGNOSIS — R7989 Other specified abnormal findings of blood chemistry: Secondary | ICD-10-CM

## 2015-05-23 MED ORDER — VITAMIN D (ERGOCALCIFEROL) 1.25 MG (50000 UNIT) PO CAPS: 50000.0000 [IU] | ORAL_CAPSULE | ORAL | Status: DC

## 2015-05-23 NOTE — Telephone Encounter (Signed)
Left message for patient to return my call for lab results. 

## 2015-05-23 NOTE — Telephone Encounter (Signed)
-----   Message from Steele Sizer, MD sent at 05/22/2015  8:52 PM EDT ----- Based on the results of lipid panel his cardiovascular risk factor   in the next 10 years is : 1.4% Lipid panel shows low HDL : to improve HDL patient  needs to eat tree nuts ( pecans/pistachios/almonds ) four times weekly, eat fish two times weekly  and exercise  at least 150 minutes per week Sugar, kidney and transaminases are within normal limits Normal CBC HgbA1C shows pre-diabetes and needs to follow a diabetic diet Normal TSH B12 is low, needs to either take otc B12 1000 mg SL daily or come in for B12 shots monthly Vitamin D is low, I will send prescription vitamin D to  pharmacy and once finished she/he needs to take otc vitamin D 2000 units daily She is not menopausal yet, only peri-menopausal therefore not a candidate for estrogen replacement therapy, safer to take SSRI as discussed during her visit.

## 2015-06-20 ENCOUNTER — Encounter: Payer: Self-pay | Admitting: Family Medicine

## 2015-10-24 ENCOUNTER — Encounter: Payer: Self-pay | Admitting: Family Medicine

## 2015-10-24 ENCOUNTER — Ambulatory Visit (INDEPENDENT_AMBULATORY_CARE_PROVIDER_SITE_OTHER): Payer: BLUE CROSS/BLUE SHIELD | Admitting: Family Medicine

## 2015-10-24 VITALS — BP 116/70 | HR 73 | Temp 98.0°F | Resp 18 | Ht 59.0 in | Wt 163.0 lb

## 2015-10-24 DIAGNOSIS — M1611 Unilateral primary osteoarthritis, right hip: Secondary | ICD-10-CM

## 2015-10-24 DIAGNOSIS — K219 Gastro-esophageal reflux disease without esophagitis: Secondary | ICD-10-CM | POA: Diagnosis not present

## 2015-10-24 DIAGNOSIS — E8881 Metabolic syndrome: Secondary | ICD-10-CM | POA: Diagnosis not present

## 2015-10-24 DIAGNOSIS — J4521 Mild intermittent asthma with (acute) exacerbation: Secondary | ICD-10-CM | POA: Diagnosis not present

## 2015-10-24 DIAGNOSIS — Z23 Encounter for immunization: Secondary | ICD-10-CM | POA: Diagnosis not present

## 2015-10-24 DIAGNOSIS — G4700 Insomnia, unspecified: Secondary | ICD-10-CM | POA: Diagnosis not present

## 2015-10-24 DIAGNOSIS — F39 Unspecified mood [affective] disorder: Secondary | ICD-10-CM

## 2015-10-24 DIAGNOSIS — G8929 Other chronic pain: Secondary | ICD-10-CM

## 2015-10-24 DIAGNOSIS — Z79899 Other long term (current) drug therapy: Secondary | ICD-10-CM

## 2015-10-24 DIAGNOSIS — G43009 Migraine without aura, not intractable, without status migrainosus: Secondary | ICD-10-CM | POA: Diagnosis not present

## 2015-10-24 DIAGNOSIS — R4586 Emotional lability: Secondary | ICD-10-CM | POA: Insufficient documentation

## 2015-10-24 DIAGNOSIS — K5903 Drug induced constipation: Secondary | ICD-10-CM

## 2015-10-24 DIAGNOSIS — E785 Hyperlipidemia, unspecified: Secondary | ICD-10-CM

## 2015-10-24 DIAGNOSIS — R232 Flushing: Secondary | ICD-10-CM | POA: Insufficient documentation

## 2015-10-24 DIAGNOSIS — N951 Menopausal and female climacteric states: Secondary | ICD-10-CM | POA: Diagnosis not present

## 2015-10-24 MED ORDER — LUBIPROSTONE 8 MCG PO CAPS: 8.0000 ug | ORAL_CAPSULE | Freq: Two times a day (BID) | ORAL | 2 refills | Status: DC

## 2015-10-24 MED ORDER — RANITIDINE HCL 150 MG PO TABS: 150.0000 mg | ORAL_TABLET | Freq: Two times a day (BID) | ORAL | 1 refills | Status: DC

## 2015-10-24 MED ORDER — DESVENLAFAXINE SUCCINATE ER 50 MG PO TB24: 50.0000 mg | ORAL_TABLET | Freq: Every day | ORAL | 2 refills | Status: DC

## 2015-10-24 MED ORDER — GABAPENTIN 100 MG PO CAPS: 100.0000 mg | ORAL_CAPSULE | Freq: Every day | ORAL | 0 refills | Status: DC

## 2015-10-24 NOTE — Progress Notes (Signed)
Name: Shelby Henson   MRN: [id number]    DOB: January 20, 1971   Date:10/24/2015       Progress Note  Subjective  Chief Complaint  Chief Complaint  Patient presents with  . Follow-up  . Asthma    Patient states her Asthma is well controlled and only has a flair up around December- April.   . Insomnia    Patient is unable to take Trazodone due to taking pain medication and states she has been taking Melatonin 15 mg otc but with no relief. Patient states her mind will not shut off and is only gettins 2 to 3 hours of sleep a night.     HPI  Insomnia: she has difficulty staying asleep, she wakes up with pain on her right hip. Able to fall asleep without problems. She was doing well on Trazodone, but since she has been on Oxycontin , pain doctor advised to stop taking Trazodone, so she has noticed worsening of symptoms  Chronic hip pain: currently seeing pain management [facility]. Taking Oxycodone, denies sedation, she states she takes stools softeners to help with constipation Pain is described  As aching , dull pain, constant. Average pain is 4/10 with pain medication . Discussed Amitiza and she would like to try it  Hot Flashes: she had a hysterectomy in 09/13/2008 - she has noticed hot flashes, mood swings, breast tenderness. She would like to know if she is reaching menopause, she is s/p hysterectomy, she states symptoms are still present and would like medication to decrease symptoms. We will try Gabapentin and also resume anti-depressant medication   Asthma Mild Intermittent: currently no symptoms. Symptoms present during winter months  AR:she is doing well at this time, symptoms only present in the winter months  Migraine: she states symptoms has been worse lately, waking up with migraines since she stopped taking Trazodone and is not sleeping well, headache is described as throbbing, frontal and 3-4 days weekly. Sometimes has photophobia, phonophobia and intermittent nausea.  Mood  Disorder: she used to have MDD, that currently is not a problem, however she feels mean, not at work, but at home, short temper, impatient and angry, she thinks related to perimenopause and lack of sleep but would like to resume Pritiq  Metabolic Syndrome: diagnosed in March 2017, hgbA1C was 5.8% and lipid panel showed high triglycerides and low HDL, she has changed her diet, lost 15 lbs in 5 months and is doing great. Denies polyphagia, polyuria or polydipsia   Patient Active Problem List   Diagnosis Date Noted  . Allergic rhinitis, seasonal 05/17/2015  . Asthma, mild intermittent 08/08/2014  . Chronic insomnia 08/08/2014  . DDD (degenerative disc disease), lumbar 08/08/2014  . Gastro-esophageal reflux disease without esophagitis 08/08/2014  . H/O total hysterectomy 08/08/2014  . Irritable bowel syndrome with diarrhea 08/08/2014  . Osteoarthritis of right hip 08/08/2014  . Adult BMI 30+ 08/08/2014  . SBO (spina bifida occulta) 08/08/2014    Past Surgical History:  Procedure Laterality Date  . ABDOMINAL HYSTERECTOMY    . CESAREAN SECTION    . CESAREAN SECTION      Family History  Problem Relation Age of Onset  . Bipolar disorder Brother   . Depression Mother     Social History   Social History  . Marital status: Divorced    Spouse name: N/A  . Number of children: N/A  . Years of education: N/A   Occupational History  . Not on file.   Social History Main  Topics  . Smoking status: Never Smoker  . Smokeless tobacco: Never Used  . Alcohol use 0.0 oz/week     Comment: occasionally  . Drug use: No  . Sexual activity: Not Currently   Other Topics Concern  . Not on file   Social History Narrative  . No narrative on file     Current Outpatient Prescriptions:  .  albuterol (PROVENTIL) (2.5 MG/3ML) 0.083% nebulizer solution, Take 3 mLs (2.5 mg total) by nebulization every 6 (six) hours as needed for wheezing or shortness of breath., Disp: 75 mL, Rfl: 2 .   methocarbamol (ROBAXIN) 750 MG tablet, Take 750 mg by mouth 2 (two) times daily. [facility], Disp: , Rfl:  .  OXYCONTIN 10 MG 12 hr tablet, Take 10 mg by mouth every 12 (twelve) hours. [facility], Disp: , Rfl:  .  ranitidine (ZANTAC) 150 MG tablet, Take 1 tablet (150 mg total) by mouth 2 (two) times daily., Disp: 180 tablet, Rfl: 1 .  desvenlafaxine (PRISTIQ) 50 MG 24 hr tablet, Take 1 tablet (50 mg total) by mouth daily., Disp: 30 tablet, Rfl: 2  Allergies  Allergen Reactions  . Prednisone     intolerance, makes her angry. Makes her angry  . Zolpidem   . Codeine Rash  . Penicillins Rash     ROS  Constitutional: Negative for fever, positive for weight change.  Respiratory: Negative for cough and shortness of breath.   Cardiovascular: Negative for chest pain or palpitations.  Gastrointestinal: Negative for abdominal pain, no bowel changes.  Musculoskeletal: Negative for gait problem or joint swelling.  Skin: Negative for rash.  Neurological: Negative for dizziness , positive for  headache.  No other specific complaints in a complete review of systems (except as listed in HPI above).  Objective  Vitals:   10/24/15 0955  BP: 116/70  Pulse: 73  Resp: 18  Temp: 98 F (36.7 C)  TempSrc: Oral  SpO2: 96%  Weight: 163 lb (73.9 kg)  Height: 4\' 11"  (1.499 m)    Body mass index is 32.92 kg/m.  Physical Exam  Constitutional: Patient appears well-developed and well-nourished. Obese  No distress.  HEENT: head atraumatic, normocephalic, pupils equal and reactive to light,neck supple, throat within normal limits Cardiovascular: Normal rate, regular rhythm and normal heart sounds.  No murmur heard. No BLE edema. Pulmonary/Chest: Effort normal and breath sounds normal. No respiratory distress. Abdominal: Soft.  There is no tenderness. Psychiatric: Patient has a normal mood and affect. behavior is normal. Judgment and thought content normal.  PHQ2/9: Depression screen [facility] 2/9  10/24/2015 05/17/2015 08/11/2014  Decreased Interest 0 0 0  Down, Depressed, Hopeless 0 0 0  PHQ - 2 Score 0 0 0     Fall Risk: Fall Risk  10/24/2015 05/17/2015 08/11/2014  Falls in the past year? No No Yes  Number falls in past yr: - - 1  Injury with Fall? - - Yes      Functional Status Survey: Is the patient deaf or have difficulty hearing?: No Does the patient have difficulty seeing, even when wearing glasses/contacts?: No Does the patient have difficulty concentrating, remembering, or making decisions?: No Does the patient have difficulty walking or climbing stairs?: No Does the patient have difficulty dressing or bathing?: No Does the patient have difficulty doing errands alone such as visiting a doctor's office or shopping?: No    Assessment & Plan  1. Asthma, mild intermittent, with acute exacerbation  Doing well at this time  2. Primary  osteoarthritis of right hip  Continue follow up with [facility]  3. Gastro-esophageal reflux disease without esophagitis  - ranitidine (ZANTAC) 150 MG tablet; Take 1 tablet (150 mg total) by mouth 2 (two) times daily.  Dispense: 180 tablet; Refill: 1  4. Chronic pain  Continue [facility]   5. Hot flashes  - desvenlafaxine (PRISTIQ) 50 MG 24 hr tablet; Take 1 tablet (50 mg total) by mouth daily.  Dispense: 30 tablet; Refill: 2  6. Mood changes (HCC)  - desvenlafaxine (PRISTIQ) 50 MG 24 hr tablet; Take 1 tablet (50 mg total) by mouth daily.  Dispense: 30 tablet; Refill: 2  7. Needs flu shot  - Flu Vaccine QUAD 36+ mos IM  8. Migraine without aura and without status migrainosus, not intractable  - gabapentin (NEURONTIN) 100 MG capsule; Take 1-3 capsules (100-300 mg total) by mouth at bedtime.  Dispense: 90 capsule; Refill: 0  9. Insomnia  - gabapentin (NEURONTIN) 100 MG capsule; Take 1-3 capsules (100-300 mg total) by mouth at bedtime.  Dispense: 90 capsule; Refill: 0  10. Metabolic syndrome  - Hemoglobin  A1c  11. Long-term use of high-risk medication  - COMPLETE METABOLIC PANEL WITH GFR  12. Dyslipidemia  - Lipid panel  13. Constipation due to pain medication  - lubiprostone (AMITIZA) 8 MCG capsule; Take 1 capsule (8 mcg total) by mouth 2 (two) times daily with a meal.  Dispense: 60 capsule; Refill: 2

## 2015-11-09 ENCOUNTER — Other Ambulatory Visit: Payer: Self-pay | Admitting: Family Medicine

## 2015-11-09 LAB — COMPLETE METABOLIC PANEL WITH GFR
ALT: 56 U/L — AB (ref 6–29)
AST: 35 U/L (ref 10–35)
Albumin: 4 g/dL (ref 3.6–5.1)
Alkaline Phosphatase: 78 U/L (ref 33–115)
BILIRUBIN TOTAL: 0.5 mg/dL (ref 0.2–1.2)
BUN: 15 mg/dL (ref 7–25)
CHLORIDE: 104 mmol/L (ref 98–110)
CO2: 26 mmol/L (ref 20–31)
CREATININE: 0.58 mg/dL (ref 0.50–1.10)
Calcium: 9.2 mg/dL (ref 8.6–10.2)
Glucose, Bld: 86 mg/dL (ref 65–99)
Potassium: 4.4 mmol/L (ref 3.5–5.3)
Sodium: 139 mmol/L (ref 135–146)
TOTAL PROTEIN: 6.7 g/dL (ref 6.1–8.1)

## 2015-11-09 LAB — LIPID PANEL
CHOLESTEROL: 164 mg/dL (ref 125–200)
HDL: 41 mg/dL — ABNORMAL LOW (ref >=46)
LDL CALC: 97 mg/dL (ref <130)
Total CHOL/HDL Ratio: 4 Ratio (ref <=5.0)
Triglycerides: 131 mg/dL (ref <150)
VLDL: 26 mg/dL (ref <30)

## 2015-11-10 LAB — HEMOGLOBIN A1C
HEMOGLOBIN A1C: 5.1 % (ref <5.7)
Mean Plasma Glucose: 100 mg/dL

## 2015-11-19 ENCOUNTER — Other Ambulatory Visit: Payer: Self-pay | Admitting: Family Medicine

## 2015-11-19 DIAGNOSIS — G43009 Migraine without aura, not intractable, without status migrainosus: Secondary | ICD-10-CM

## 2015-11-19 DIAGNOSIS — G4700 Insomnia, unspecified: Secondary | ICD-10-CM

## 2015-11-21 ENCOUNTER — Other Ambulatory Visit: Payer: Self-pay | Admitting: Family Medicine

## 2015-11-21 ENCOUNTER — Telehealth: Payer: Self-pay | Admitting: Family Medicine

## 2015-11-21 NOTE — Telephone Encounter (Signed)
It was sent 09/20 with 2 refills

## 2015-11-21 NOTE — Telephone Encounter (Signed)
Patient requesting refill on Pristiq 50mg . Asking that you send to [facility]. She have one pill left and have upcoming appointment for December.

## 2015-11-22 NOTE — Telephone Encounter (Signed)
Patient just found the bottle and requested pharmacy to fill the refills.

## 2015-12-27 ENCOUNTER — Other Ambulatory Visit: Payer: Self-pay | Admitting: Family Medicine

## 2015-12-27 DIAGNOSIS — G4700 Insomnia, unspecified: Secondary | ICD-10-CM

## 2015-12-27 DIAGNOSIS — G43009 Migraine without aura, not intractable, without status migrainosus: Secondary | ICD-10-CM

## 2016-01-09 ENCOUNTER — Ambulatory Visit: Payer: BLUE CROSS/BLUE SHIELD | Admitting: Family Medicine

## 2016-01-16 ENCOUNTER — Other Ambulatory Visit: Payer: Self-pay | Admitting: Family Medicine

## 2016-01-16 DIAGNOSIS — R4586 Emotional lability: Secondary | ICD-10-CM

## 2016-01-16 DIAGNOSIS — R232 Flushing: Secondary | ICD-10-CM

## 2016-01-16 NOTE — Telephone Encounter (Signed)
Patient requesting refill of [facility]iq to [facility]. 

## 2016-01-28 ENCOUNTER — Other Ambulatory Visit: Payer: Self-pay | Admitting: Family Medicine

## 2016-01-28 DIAGNOSIS — G43009 Migraine without aura, not intractable, without status migrainosus: Secondary | ICD-10-CM

## 2016-01-28 DIAGNOSIS — G4700 Insomnia, unspecified: Secondary | ICD-10-CM

## 2016-01-30 HISTORY — PX: LAPAROSCOPY: SHX197

## 2016-02-15 ENCOUNTER — Ambulatory Visit: Payer: BLUE CROSS/BLUE SHIELD | Admitting: Family Medicine

## 2016-02-23 ENCOUNTER — Ambulatory Visit: Payer: BLUE CROSS/BLUE SHIELD | Admitting: Family Medicine

## 2016-02-29 ENCOUNTER — Other Ambulatory Visit: Payer: Self-pay | Admitting: Family Medicine

## 2016-02-29 DIAGNOSIS — G4700 Insomnia, unspecified: Secondary | ICD-10-CM

## 2016-02-29 DIAGNOSIS — G43009 Migraine without aura, not intractable, without status migrainosus: Secondary | ICD-10-CM

## 2016-03-01 NOTE — Telephone Encounter (Signed)
Pt is on the cancellation list waiting for an appt

## 2016-04-20 ENCOUNTER — Other Ambulatory Visit: Payer: Self-pay | Admitting: Family Medicine

## 2016-04-20 ENCOUNTER — Ambulatory Visit: Payer: Self-pay | Admitting: Family Medicine

## 2016-04-20 DIAGNOSIS — R4586 Emotional lability: Secondary | ICD-10-CM

## 2016-04-20 DIAGNOSIS — R232 Flushing: Secondary | ICD-10-CM

## 2016-04-20 NOTE — Telephone Encounter (Signed)
Patient requesting refill of [facility]iq to [facility].

## 2016-05-14 ENCOUNTER — Encounter: Payer: Self-pay | Admitting: Family Medicine

## 2016-05-14 ENCOUNTER — Ambulatory Visit (INDEPENDENT_AMBULATORY_CARE_PROVIDER_SITE_OTHER): Payer: BLUE CROSS/BLUE SHIELD | Admitting: Family Medicine

## 2016-05-14 VITALS — BP 108/72 | HR 83 | Temp 97.7°F | Resp 16 | Ht 59.0 in | Wt 159.5 lb

## 2016-05-14 DIAGNOSIS — E785 Hyperlipidemia, unspecified: Secondary | ICD-10-CM | POA: Diagnosis not present

## 2016-05-14 DIAGNOSIS — G4709 Other insomnia: Secondary | ICD-10-CM | POA: Diagnosis not present

## 2016-05-14 DIAGNOSIS — G8929 Other chronic pain: Secondary | ICD-10-CM

## 2016-05-14 DIAGNOSIS — T402X5A Adverse effect of other opioids, initial encounter: Secondary | ICD-10-CM

## 2016-05-14 DIAGNOSIS — E8881 Metabolic syndrome: Secondary | ICD-10-CM | POA: Diagnosis not present

## 2016-05-14 DIAGNOSIS — M1611 Unilateral primary osteoarthritis, right hip: Secondary | ICD-10-CM | POA: Diagnosis not present

## 2016-05-14 DIAGNOSIS — F39 Unspecified mood [affective] disorder: Secondary | ICD-10-CM | POA: Diagnosis not present

## 2016-05-14 DIAGNOSIS — K219 Gastro-esophageal reflux disease without esophagitis: Secondary | ICD-10-CM | POA: Diagnosis not present

## 2016-05-14 DIAGNOSIS — R232 Flushing: Secondary | ICD-10-CM

## 2016-05-14 DIAGNOSIS — G43009 Migraine without aura, not intractable, without status migrainosus: Secondary | ICD-10-CM | POA: Diagnosis not present

## 2016-05-14 DIAGNOSIS — K5903 Drug induced constipation: Secondary | ICD-10-CM

## 2016-05-14 DIAGNOSIS — R4586 Emotional lability: Secondary | ICD-10-CM

## 2016-05-14 MED ORDER — GABAPENTIN 300 MG PO CAPS: 300.0000 mg | ORAL_CAPSULE | Freq: Every day | ORAL | 1 refills | Status: DC

## 2016-05-14 MED ORDER — DESVENLAFAXINE SUCCINATE ER 50 MG PO TB24: 50.0000 mg | ORAL_TABLET | Freq: Every day | ORAL | 5 refills | Status: DC

## 2016-05-14 MED ORDER — LUBIPROSTONE 24 MCG PO CAPS
24.0000 ug | ORAL_CAPSULE | Freq: Two times a day (BID) | ORAL | 5 refills | Status: DC
Start: 2016-05-14 — End: 2016-09-24

## 2016-05-14 NOTE — Progress Notes (Signed)
Name: Shelby Henson   MRN: [id number]    DOB: Sep 22, 1970   Date:05/14/2016       Progress Note  Subjective  Chief Complaint  Chief Complaint  Patient presents with  . Medication Refill    6 month F/U  . Asthma  . Allergic Rhinitis   . Migraine  . Insomnia  . Mood Disorder    HPI   Insomnia: she has difficulty staying asleep, she wakes up with pain on her right hip. Able to fall asleep without problems. She was doing well on Trazodone, but since she has been on Oxycontin , pain doctor advised to stop taking Trazodone. She is frustrated about having pain and not being able to sleep. We will try increase dose of Gabapentin  Chronic hip pain: currently seeing pain management [facility]. Taking Oxycodone, denies sedation, she states she takes stools softeners to help with constipation, but bowel movements still about once a week.  Pain is described as aching , dull pain, constant. Average pain is 4-5/10 with pain medication , sometimes radiates down to right ankle.   Hot Flashes: she had a hysterectomy in 09/13/2008 - she has noticed hot flashes, mood swings, breast tenderness. She would like to know if she is reaching menopause, she is s/p hysterectomy, she states symptoms are still present and would like medication to decrease symptoms. She states Gabapentin and Pristiq don't seem to work a lot  Asthma Mild Intermittent: currently no symptoms. 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  Migraine: unchanged , got worse since she stopped  Trazodone and is not sleeping well, headache is described as throbbing, frontal and 3-4 days weekly. Sometimes has photophobia, phonophobia and intermittent nausea.  Mood Disorder: she is back on Pristiq and even though she is depressed because of pain, she is no longer as snappy or moody with family.   Metabolic Syndrome: diagnosed in March 2017, hgbA1C was 5.8% and lipid panel showed high triglycerides and low HDL, she has changed her  diet, lost 15 lbs in the first  5 months and is doing great, lost another 4 lbs since last visit . Denies polyphagia, polyuria or polydipsia   Patient Active Problem List   Diagnosis Date Noted  . Chronic pain 10/24/2015  . Hot flashes 10/24/2015  . Mood changes ([place]) 10/24/2015  . Migraine without aura and without status migrainosus, not intractable 10/24/2015  . Metabolic syndrome 37/16/9678  . Dyslipidemia 10/24/2015  . Allergic rhinitis, seasonal 05/17/2015  . Asthma, mild intermittent 08/08/2014  . Chronic insomnia 08/08/2014  . DDD (degenerative disc disease), lumbar 08/08/2014  . Gastro-esophageal reflux disease without esophagitis 08/08/2014  . H/O total hysterectomy 08/08/2014  . Irritable bowel syndrome with diarrhea 08/08/2014  . Osteoarthritis of right hip 08/08/2014  . Adult BMI 30+ 08/08/2014  . SBO (spina bifida occulta) 08/08/2014    Past Surgical History:  Procedure Laterality Date  . ABDOMINAL HYSTERECTOMY    . CESAREAN SECTION    . CESAREAN SECTION      Family History  Problem Relation Age of Onset  . Bipolar disorder Brother   . Depression Mother     Social History   Social History  . Marital status: Divorced    Spouse name: N/A  . Number of children: N/A  . Years of education: N/A   Occupational History  . Not on file.   Social History Main Topics  . Smoking status: Never Smoker  . Smokeless tobacco: Never Used  .  Alcohol use 0.0 oz/week     Comment: occasionally  . Drug use: No  . Sexual activity: Not Currently     Comment: boyfriend unable   Other Topics Concern  . Not on file   Social History Narrative  . No narrative on file     Current Outpatient Prescriptions:  .  albuterol (PROVENTIL) (2.5 MG/3ML) 0.083% nebulizer solution, Take 3 mLs (2.5 mg total) by nebulization every 6 (six) hours as needed for wheezing or shortness of breath., Disp: 75 mL, Rfl: 2 .  desvenlafaxine (PRISTIQ) 50 MG 24 hr tablet, Take 1 tablet (50 mg  total) by mouth daily., Disp: 30 tablet, Rfl: 5 .  gabapentin (NEURONTIN) 300 MG capsule, Take 1 capsule (300 mg total) by mouth at bedtime., Disp: 90 capsule, Rfl: 1 .  Meloxicam 10 MG CAPS, Take by mouth., Disp: , Rfl:  .  methocarbamol (ROBAXIN) 750 MG tablet, Take 750 mg by mouth 2 (two) times daily. [facility], Disp: , Rfl:  .  OXYCONTIN 10 MG 12 hr tablet, Take 10 mg by mouth every 12 (twelve) hours. [facility], Disp: , Rfl:  .  ranitidine (ZANTAC) 150 MG tablet, Take 1 tablet (150 mg total) by mouth 2 (two) times daily., Disp: 180 tablet, Rfl: 1 .  lubiprostone (AMITIZA) 24 MCG capsule, Take 1 capsule (24 mcg total) by mouth 2 (two) times daily with a meal., Disp: 60 capsule, Rfl: 5  Allergies  Allergen Reactions  . Prednisone     intolerance, makes her angry. Makes her angry  . Zolpidem   . Codeine Rash  . Penicillins Rash     ROS  Constitutional: Negative for fever or weight change.  Respiratory: Negative for cough and shortness of breath.   Cardiovascular: Negative for chest pain or palpitations.  Gastrointestinal: Negative for abdominal pain, no bowel changes.  Musculoskeletal: Positive  for gait problem but no  joint swelling.  Skin: Negative for rash.  Neurological: Negative for dizziness or headache.  No other specific complaints in a complete review of systems (except as listed in HPI above).  Objective  Vitals:   05/14/16 0947  BP: 108/72  Pulse: 83  Resp: 16  Temp: 97.7 F (36.5 C)  SpO2: 96%  Weight: 159 lb 8 oz (72.3 kg)  Height: 4\' 11"  (1.499 m)    Body mass index is 32.22 kg/m.  Physical Exam  Constitutional: Patient appears well-developed and well-nourished. Obese  No distress.  HEENT: head atraumatic, normocephalic, pupils equal and reactive to light,neck supple, throat within normal limits Cardiovascular: Normal rate, regular rhythm and normal heart sounds.  No murmur heard. No BLE edema. Pulmonary/Chest: Effort normal and breath sounds  normal. No respiratory distress. Abdominal: Soft.  There is no tenderness. Psychiatric: Patient has a normal mood and affect. behavior is normal. Judgment and thought content normal. Muscular Skeletal: pain with internal rotation of both hips, but right worse than left    PHQ2/9: Depression screen [facility] 2/9 05/14/2016 10/24/2015 10/24/2015 05/17/2015 08/11/2014  Decreased Interest 0 0 0 0 0  Down, Depressed, Hopeless 0 0 0 0 0  PHQ - 2 Score 0 0 0 0 0     Fall Risk: Fall Risk  05/14/2016 10/24/2015 05/17/2015 08/11/2014  Falls in the past year? No No No Yes  Number falls in past yr: - - - 1  Injury with Fall? - - - Yes      Assessment & Plan  1. Mood changes (HCC)  - desvenlafaxine (PRISTIQ) 50  MG 24 hr tablet; Take 1 tablet (50 mg total) by mouth daily.  Dispense: 30 tablet; Refill: 5  2. Metabolic syndrome  Last VFMB3U is at goal   3. Gastro-esophageal reflux disease without esophagitis  Controlled with medication   4. Migraine without aura and without status migrainosus, not intractable  - gabapentin (NEURONTIN) 300 MG capsule; Take 1 capsule (300 mg total) by mouth at bedtime.  Dispense: 90 capsule; Refill: 1  5. Other insomnia  Advised to increase dose of Gabapentin to help her sleep   6. Dyslipidemia  On life style modification   7. Other chronic pain  Continue follow up with pain clinic  8. Primary osteoarthritis of right hip  Causing pain that affects her sleep   9. Constipation due to opioid therapy  - lubiprostone (AMITIZA) 24 MCG capsule; Take 1 capsule (24 mcg total) by mouth 2 (two) times daily with a meal.  Dispense: 60 capsule; Refill: 5  10. Hot flashes  - desvenlafaxine (PRISTIQ) 50 MG 24 hr tablet; Take 1 tablet (50 mg total) by mouth daily.  Dispense: 30 tablet; Refill: 5

## 2016-06-11 ENCOUNTER — Other Ambulatory Visit: Payer: Self-pay

## 2016-06-11 DIAGNOSIS — R4586 Emotional lability: Secondary | ICD-10-CM

## 2016-06-11 DIAGNOSIS — R232 Flushing: Secondary | ICD-10-CM

## 2016-06-11 NOTE — Telephone Encounter (Signed)
Patient requesting refill of [facility]iq to [facility].

## 2016-06-12 MED ORDER — DESVENLAFAXINE SUCCINATE ER 50 MG PO TB24: 50.0000 mg | ORAL_TABLET | Freq: Every day | ORAL | 1 refills | Status: DC

## 2016-07-25 ENCOUNTER — Ambulatory Visit (INDEPENDENT_AMBULATORY_CARE_PROVIDER_SITE_OTHER): Payer: BLUE CROSS/BLUE SHIELD | Admitting: Family Medicine

## 2016-07-25 ENCOUNTER — Encounter: Payer: Self-pay | Admitting: Family Medicine

## 2016-07-25 VITALS — BP 124/68 | HR 102 | Temp 99.4°F | Resp 18 | Ht 59.0 in | Wt 149.9 lb

## 2016-07-25 DIAGNOSIS — J4521 Mild intermittent asthma with (acute) exacerbation: Secondary | ICD-10-CM

## 2016-07-25 DIAGNOSIS — J069 Acute upper respiratory infection, unspecified: Secondary | ICD-10-CM

## 2016-07-25 MED ORDER — AZELASTINE-FLUTICASONE 137-50 MCG/ACT NA SUSP: 2.0000 | Freq: Every day | NASAL | 1 refills | Status: DC

## 2016-07-25 MED ORDER — BUDESONIDE-FORMOTEROL FUMARATE 80-4.5 MCG/ACT IN AERO: 2.0000 | INHALATION_SPRAY | Freq: Two times a day (BID) | RESPIRATORY_TRACT | 3 refills | Status: DC

## 2016-07-25 NOTE — Progress Notes (Addendum)
Name: Shelby Henson   MRN: [id number]    DOB: [birth year]   Date:07/25/2016       Progress Note  Subjective  Chief Complaint  Chief Complaint  Patient presents with  . URI    cough, congested, whezzing for 4 days    HPI  Pt presents with c/o URI x7 days, and is having an asthma exacerbation. Started using her albuterol nebulizer every 4 hours 4 days ago with minimal relief of symptoms.  Does not have albuterol inhaler and does not take any other meds for her asthma - she has been very well controlled for about 2 years.  Endorses congested ears and nose, occasional cough, some intermittent wheezing, and some mild shortness of breath, chest pain when coughing only, chills, fatigue.  No abdominal pain, NVD, fevers, body aches  Patient Active Problem List   Diagnosis Date Noted  . Chronic pain 10/24/2015  . Hot flashes 10/24/2015  . Mood changes ([place]) 10/24/2015  . Migraine without aura and without status migrainosus, not intractable 10/24/2015  . Metabolic syndrome 47/42/5956  . Dyslipidemia 10/24/2015  . Allergic rhinitis, seasonal 05/17/2015  . Asthma, mild intermittent 08/08/2014  . Chronic insomnia 08/08/2014  . DDD (degenerative disc disease), lumbar 08/08/2014  . Gastro-esophageal reflux disease without esophagitis 08/08/2014  . H/O total hysterectomy 08/08/2014  . Irritable bowel syndrome with diarrhea 08/08/2014  . Osteoarthritis of right hip 08/08/2014  . Adult BMI 30+ 08/08/2014  . SBO (spina bifida occulta) 08/08/2014    Social History  Substance Use Topics  . Smoking status: Never Smoker  . Smokeless tobacco: Never Used  . Alcohol use 0.0 oz/week     Comment: occasionally     Current Outpatient Prescriptions:  .  albuterol (PROVENTIL) (2.5 MG/3ML) 0.083% nebulizer solution, Take 3 mLs (2.5 mg total) by nebulization every 6 (six) hours as needed for wheezing or shortness of breath., Disp: 75 mL, Rfl: 2 .  desvenlafaxine (PRISTIQ) 50 MG 24 hr tablet, Take 1 tablet  (50 mg total) by mouth daily., Disp: 90 tablet, Rfl: 1 .  gabapentin (NEURONTIN) 300 MG capsule, Take 1 capsule (300 mg total) by mouth at bedtime., Disp: 90 capsule, Rfl: 1 .  lubiprostone (AMITIZA) 24 MCG capsule, Take 1 capsule (24 mcg total) by mouth 2 (two) times daily with a meal., Disp: 60 capsule, Rfl: 5 .  methocarbamol (ROBAXIN) 750 MG tablet, Take 750 mg by mouth 2 (two) times daily. [facility], Disp: , Rfl:  .  OXYCONTIN 10 MG 12 hr tablet, Take 10 mg by mouth every 12 (twelve) hours. [facility], Disp: , Rfl:  .  ranitidine (ZANTAC) 150 MG tablet, Take 1 tablet (150 mg total) by mouth 2 (two) times daily., Disp: 180 tablet, Rfl: 1 .  Meloxicam 10 MG CAPS, Take by mouth., Disp: , Rfl:   Allergies  Allergen Reactions  . Prednisone     intolerance, makes her angry. Makes her angry  . Zolpidem   . Codeine Rash  . Penicillins Rash    ROS  Ten systems reviewed and is negative except as mentioned in HPI   Objective  Vitals:   07/25/16 1411  BP: 124/68  Pulse: (!) 102  Resp: 18  Temp: 99.4 F (37.4 C)  TempSrc: Oral  SpO2: 98%  Weight: 149 lb 14.4 oz (68 kg)  Height: 4\' 11"  (1.499 m)   Body mass index is 30.28 kg/m.  Nursing Note and Vital Signs reviewed.  Heart rate slightly elevated, likely secondary to  illness and albuterol use today.  Physical Exam  Constitutional: Patient appears well-developed and well-nourished. No distress.  HEENT: head atraumatic, normocephalic, pupils equal and reactive to light, EOM's intact, TM's without erythema or bulging, no maxillary or frontal sinus pain on palpation, neck supple without lymphadenopathy, oropharynx mildly erythematous and moist without exudate Cardiovascular: Normal rate, regular rhythm, S1/S2 present.  No murmur or rub heard. No BLE edema. Pulmonary/Chest: Effort normal and breath sounds clear with no wheezing or crackles. No respiratory distress or retractions. Abdominal: Soft and non-tender, bowel sounds  present x4 quadrants. Psychiatric: Patient has a normal mood and affect. behavior is normal. Judgment and thought content normal.  No results found for this or any previous visit (from the past 2160 hour(s)).  Assessment & Plan  1. Mild intermittent asthma with acute exacerbation - budesonide-formoterol (SYMBICORT) 80-4.5 MCG/ACT inhaler; Inhale 2 puffs into the lungs 2 (two) times daily.  Dispense: 1 Inhaler; Refill: 3  2. Viral upper respiratory tract infection - Azelastine-Fluticasone 137-50 MCG/ACT SUSP; Place 2 sprays into the nose daily.  Dispense: 23 g; Refill: 1 - Pt declines Tessalon, is taking Delsym OTC, will also take Tylenol PRN. - Pt has allergy to Prednisone (hives), has done well on Pulmicort in the past, we will try Symbicort today. Discussed allergic reaction signs and symptoms and when to seek EC.  - Work note for today and tomorrow provided.  -Red flags and when to present for emergency care or RTC including fever >101.46F, chest pain, shortness of breath unrelieved with medications provided, new/worsening/un-resolving symptoms, reviewed with patient at time of visit. Follow up and care instructions discussed and provided in AVS.  I have reviewed this encounter including the documentation in this note and/or discussed this patient with the Johney Maine, FNP, NP-C. I am certifying that I agree with the content of this note as supervising physician.  Steele Sizer, MD [facility] 07/29/2016, 5:35 PM

## 2016-07-25 NOTE — Patient Instructions (Addendum)
Stay hydrated Cool Mist Humidifier Continue Delsum Continue Albuterol Neb Treatments as needed. Take either Zyrtex, Allegra, or Xyzal OTC to help decongest. Viral Respiratory Infection A viral respiratory infection is an illness that affects parts of the body used for breathing, like the lungs, nose, and throat. It is caused by a germ called a virus. Some examples of this kind of infection are:  A cold.  The flu (influenza).  A respiratory syncytial virus (RSV) infection.  How do I know if I have this infection? Most of the time this infection causes:  A stuffy or runny nose.  Yellow or green fluid in the nose.  A cough.  Sneezing.  Tiredness (fatigue).  Achy muscles.  A sore throat.  Sweating or chills.  A fever.  A headache.  How is this infection treated? If the flu is diagnosed early, it may be treated with an antiviral medicine. This medicine shortens the length of time a person has symptoms. Symptoms may be treated with over-the-counter and prescription medicines, such as:  Expectorants. These make it easier to cough up mucus.  Decongestant nasal sprays.  Doctors do not prescribe antibiotic medicines for viral infections. They do not work with this kind of infection. How do I know if I should stay home? To keep others from getting sick, stay home if you have:  A fever.  A lasting cough.  A sore throat.  A runny nose.  Sneezing.  Muscles aches.  Headaches.  Tiredness.  Weakness.  Chills.  Sweating.  An upset stomach (nausea).  Follow these instructions at home:  Rest as much as possible.  Take over-the-counter and prescription medicines only as told by your doctor.  Drink enough fluid to keep your pee (urine) clear or pale yellow.  Gargle with salt water. Do this 3-4 times per day or as needed. To make a salt-water mixture, dissolve -1 tsp of salt in 1 cup of warm water. Make sure the salt dissolves all the way.  Use nose drops  made from salt water. This helps with stuffiness (congestion). It also helps soften the skin around your nose.  Do not drink alcohol.  Do not use tobacco products, including cigarettes, chewing tobacco, and e-cigarettes. If you need help quitting, ask your doctor. Get help if:  Your symptoms last for 10 days or longer.  Your symptoms get worse over time.  You have a fever.  You have very bad pain in your face or forehead.  Parts of your jaw or neck become very swollen. Get help right away if:  You feel pain or pressure in your chest.  You have shortness of breath.  You faint or feel like you will faint.  You keep throwing up (vomiting).  You feel confused. This information is not intended to replace advice given to you by your health care provider. Make sure you discuss any questions you have with your health care provider. Document Released: 12/29/2007 Document Revised: 06/23/2015 Document Reviewed: 06/23/2014 Elsevier Interactive Patient Education  2018 [employer].

## 2016-08-03 ENCOUNTER — Ambulatory Visit
Admission: RE | Admit: 2016-08-03 | Discharge: 2016-08-03 | Disposition: A | Discharge status: Home / Self Care | Payer: BLUE CROSS/BLUE SHIELD | Source: Ambulatory Visit | Attending: Family Medicine | Admitting: Family Medicine

## 2016-08-03 ENCOUNTER — Encounter: Payer: Self-pay | Admitting: Family Medicine

## 2016-08-03 ENCOUNTER — Ambulatory Visit (INDEPENDENT_AMBULATORY_CARE_PROVIDER_SITE_OTHER): Payer: BLUE CROSS/BLUE SHIELD | Admitting: Family Medicine

## 2016-08-03 VITALS — BP 118/68 | HR 100 | Temp 97.9°F | Resp 18 | Ht 59.0 in | Wt 152.0 lb

## 2016-08-03 DIAGNOSIS — R05 Cough: Secondary | ICD-10-CM | POA: Insufficient documentation

## 2016-08-03 DIAGNOSIS — J4521 Mild intermittent asthma with (acute) exacerbation: Secondary | ICD-10-CM | POA: Diagnosis not present

## 2016-08-03 DIAGNOSIS — J40 Bronchitis, not specified as acute or chronic: Secondary | ICD-10-CM

## 2016-08-03 DIAGNOSIS — R059 Cough, unspecified: Secondary | ICD-10-CM

## 2016-08-03 MED ORDER — AZITHROMYCIN 250 MG PO TABS: ORAL_TABLET | ORAL | 0 refills | Status: DC

## 2016-08-03 MED ORDER — BENZONATATE 100 MG PO CAPS
100.0000 mg | ORAL_CAPSULE | Freq: Two times a day (BID) | ORAL | 0 refills | Status: DC | PRN
Start: 2016-08-03 — End: 2017-01-01

## 2016-08-03 MED ORDER — HYDROCOD POLST-CPM POLST ER 10-8 MG/5ML PO SUER: 5.0000 mL | Freq: Every evening | ORAL | 0 refills | Status: DC | PRN

## 2016-08-03 NOTE — Progress Notes (Signed)
Name: Shelby Henson   MRN: [id number]    DOB: 24-Mar-1970   Date:08/03/2016       Progress Note  Subjective  Chief Complaint  Chief Complaint  Patient presents with  . Asthma    pt was seen by Raquel Sarna on 07/25/16 and states that she still does not feel well              HPI  Cough: symptoms started a couple of weeks ago, initially with head congestion, rhinorrhea, fever at home, but symptoms went into her chest, she has constant chest tightness and chest pain when she coughs. She states she has been compliant with Symbicort and albuterol helps the chest tightness but only for about one hour. She has hives with oral prednisone. Tired of coughing, can't sleep at night. Tired of coughing. She states appetite is normal, no nausea or vomiting, no change in bowel movements. She has tried Delsym and Mucinex DM   Patient Active Problem List   Diagnosis Date Noted  . Chronic pain 10/24/2015  . Hot flashes 10/24/2015  . Mood changes ([place]) 10/24/2015  . Migraine without aura and without status migrainosus, not intractable 10/24/2015  . Metabolic syndrome 76/81/1572  . Dyslipidemia 10/24/2015  . Allergic rhinitis, seasonal 05/17/2015  . Asthma, mild intermittent 08/08/2014  . Chronic insomnia 08/08/2014  . DDD (degenerative disc disease), lumbar 08/08/2014  . Gastro-esophageal reflux disease without esophagitis 08/08/2014  . H/O total hysterectomy 08/08/2014  . Irritable bowel syndrome with diarrhea 08/08/2014  . Osteoarthritis of right hip 08/08/2014  . Adult BMI 30+ 08/08/2014  . SBO (spina bifida occulta) 08/08/2014    Past Surgical History:  Procedure Laterality Date  . ABDOMINAL HYSTERECTOMY    . CESAREAN SECTION    . CESAREAN SECTION      Family History  Problem Relation Age of Onset  . Bipolar disorder Brother   . Depression Mother     Social History   Social History  . Marital status: Divorced    Spouse name: N/A  . Number of children: N/A  . Years of education: N/A    Occupational History  . Not on file.   Social History Main Topics  . Smoking status: Never Smoker  . Smokeless tobacco: Never Used  . Alcohol use 0.0 oz/week     Comment: occasionally  . Drug use: No  . Sexual activity: Not Currently     Comment: boyfriend unable   Other Topics Concern  . Not on file   Social History Narrative  . No narrative on file     Current Outpatient Prescriptions:  .  albuterol (PROVENTIL) (2.5 MG/3ML) 0.083% nebulizer solution, Take 3 mLs (2.5 mg total) by nebulization every 6 (six) hours as needed for wheezing or shortness of breath., Disp: 75 mL, Rfl: 2 .  Azelastine-Fluticasone 137-50 MCG/ACT SUSP, Place 2 sprays into the nose daily., Disp: 23 g, Rfl: 1 .  azithromycin (ZITHROMAX) 250 MG tablet, Take as directed, Disp: 6 tablet, Rfl: 0 .  benzonatate (TESSALON) 100 MG capsule, Take 1-2 capsules (100-200 mg total) by mouth 2 (two) times daily as needed., Disp: 40 capsule, Rfl: 0 .  budesonide-formoterol (SYMBICORT) 80-4.5 MCG/ACT inhaler, Inhale 2 puffs into the lungs 2 (two) times daily., Disp: 1 Inhaler, Rfl: 3 .  chlorpheniramine-HYDROcodone (TUSSIONEX PENNKINETIC ER) 10-8 MG/5ML SUER, Take 5 mLs by mouth at bedtime as needed., Disp: 115 mL, Rfl: 0 .  desvenlafaxine (PRISTIQ) 50 MG 24 hr tablet, Take 1 tablet (50 mg total)  by mouth daily., Disp: 90 tablet, Rfl: 1 .  gabapentin (NEURONTIN) 300 MG capsule, Take 1 capsule (300 mg total) by mouth at bedtime., Disp: 90 capsule, Rfl: 1 .  lubiprostone (AMITIZA) 24 MCG capsule, Take 1 capsule (24 mcg total) by mouth 2 (two) times daily with a meal., Disp: 60 capsule, Rfl: 5 .  Meloxicam 10 MG CAPS, Take by mouth., Disp: , Rfl:  .  methocarbamol (ROBAXIN) 750 MG tablet, Take 750 mg by mouth 2 (two) times daily. [facility], Disp: , Rfl:  .  OXYCONTIN 10 MG 12 hr tablet, Take 10 mg by mouth every 12 (twelve) hours. [facility], Disp: , Rfl:  .  ranitidine (ZANTAC) 150 MG tablet, Take 1 tablet (150 mg total)  by mouth 2 (two) times daily., Disp: 180 tablet, Rfl: 1  Allergies  Allergen Reactions  . Prednisone     intolerance, makes her angry. Makes her angry  . Zolpidem   . Codeine Rash  . Penicillins Rash     ROS  Ten systems reviewed and is negative except as mentioned in HPI   Objective  Vitals:   08/03/16 1352  BP: 118/68  Pulse: 100  Resp: 18  Temp: 97.9 F (36.6 C)  SpO2: 98%  Weight: 152 lb (68.9 kg)  Height: 4\' 11"  (1.499 m)    Body mass index is 30.7 kg/m.  Physical Exam  Constitutional: Patient appears well-developed and well-nourished. Obese No distress.  HEENT: head atraumatic, normocephalic, pupils equal and reactive to light, ears normal TM,  neck supple, throat within normal limits Cardiovascular: Normal rate, regular rhythm and normal heart sounds.  No murmur heard. No BLE edema. Pulmonary/Chest: Effort normal and breath sounds normal. No respiratory distress. Pleuritic chest pain.  Abdominal: Soft.  There is no tenderness. Psychiatric: Patient has a normal mood and affect. behavior is normal. Judgment and thought content normal.  PHQ2/9: Depression screen [facility] 2/9 05/14/2016 10/24/2015 10/24/2015 05/17/2015 08/11/2014  Decreased Interest 0 0 0 0 0  Down, Depressed, Hopeless 0 0 0 0 0  PHQ - 2 Score 0 0 0 0 0    Fall Risk: Fall Risk  05/14/2016 10/24/2015 05/17/2015 08/11/2014  Falls in the past year? No No No Yes  Number falls in past yr: - - - 1  Injury with Fall? - - - Yes     Assessment & Plan  1. Mild intermittent asthma with acute exacerbation  Can't tolerate oral prednisone, we will treat for possible co-infection since not better  2. Bronchitis  She can hold off on Azithromycin until CXR or Monday to see if symptoms are significantly better. She has a pain contract with [facility] and we will make sure it is okay for her to take Tussionex - benzonatate (TESSALON) 100 MG capsule; Take 1-2 capsules (100-200 mg total) by mouth 2 (two) times  daily as needed.  Dispense: 40 capsule; Refill: 0 - chlorpheniramine-HYDROcodone (TUSSIONEX PENNKINETIC ER) 10-8 MG/5ML SUER; Take 5 mLs by mouth at bedtime as needed.  Dispense: 115 mL; Refill: 0 Contacted [facility] and they would not allow pt to take medication with hydrocodone in it. So rx was destroyed  - azithromycin (ZITHROMAX) 250 MG tablet; Take as directed  Dispense: 6 tablet; Refill: 0  3. Cough  - DG Chest 2 View; Future

## 2016-09-24 ENCOUNTER — Other Ambulatory Visit: Payer: Self-pay | Admitting: Family Medicine

## 2016-09-24 DIAGNOSIS — K5903 Drug induced constipation: Secondary | ICD-10-CM

## 2016-09-24 DIAGNOSIS — T402X5A Adverse effect of other opioids, initial encounter: Principal | ICD-10-CM

## 2016-10-21 ENCOUNTER — Other Ambulatory Visit: Payer: Self-pay | Admitting: Family Medicine

## 2016-10-21 DIAGNOSIS — G43009 Migraine without aura, not intractable, without status migrainosus: Secondary | ICD-10-CM

## 2016-11-05 ENCOUNTER — Other Ambulatory Visit: Payer: Self-pay

## 2016-11-05 DIAGNOSIS — R232 Flushing: Secondary | ICD-10-CM

## 2016-11-05 DIAGNOSIS — R4586 Emotional lability: Secondary | ICD-10-CM

## 2016-11-05 MED ORDER — DESVENLAFAXINE SUCCINATE ER 50 MG PO TB24: 50.0000 mg | ORAL_TABLET | Freq: Every day | ORAL | 0 refills | Status: DC

## 2016-11-05 NOTE — Telephone Encounter (Signed)
I will send a refill, but she needs to be seen, last visit 07/2016

## 2016-11-05 NOTE — Telephone Encounter (Signed)
Patient requesting refill of Pristiq to [facility] with a 90 day supply.

## 2016-11-06 NOTE — Telephone Encounter (Signed)
LMOM to inform pt to call and schedule an appt °

## 2016-11-25 ENCOUNTER — Telehealth: Payer: Self-pay | Admitting: Family Medicine

## 2016-11-25 DIAGNOSIS — K219 Gastro-esophageal reflux disease without esophagitis: Secondary | ICD-10-CM

## 2016-11-26 NOTE — Telephone Encounter (Signed)
She needs follow up

## 2016-11-26 NOTE — Telephone Encounter (Signed)
Left voicemail informing patient to schedule appointment. I did create a CRM for this as well.

## 2016-12-05 ENCOUNTER — Other Ambulatory Visit: Payer: Self-pay | Admitting: Family Medicine

## 2016-12-05 DIAGNOSIS — T402X5A Adverse effect of other opioids, initial encounter: Principal | ICD-10-CM

## 2016-12-05 DIAGNOSIS — K5903 Drug induced constipation: Secondary | ICD-10-CM

## 2016-12-05 NOTE — Telephone Encounter (Signed)
Refill request for general medication: Amitza  Last office visit:  08/03/2016  Last physical exam: 05/17/2015  Follow up scheduled: None indicated

## 2017-01-01 ENCOUNTER — Other Ambulatory Visit: Payer: Self-pay | Admitting: Family Medicine

## 2017-01-01 ENCOUNTER — Ambulatory Visit: Payer: BLUE CROSS/BLUE SHIELD | Admitting: Family Medicine

## 2017-01-01 ENCOUNTER — Encounter: Payer: Self-pay | Admitting: Family Medicine

## 2017-01-01 VITALS — BP 114/80 | HR 92 | Resp 14 | Ht 59.0 in | Wt 158.3 lb

## 2017-01-01 DIAGNOSIS — G43009 Migraine without aura, not intractable, without status migrainosus: Secondary | ICD-10-CM

## 2017-01-01 DIAGNOSIS — K219 Gastro-esophageal reflux disease without esophagitis: Secondary | ICD-10-CM

## 2017-01-01 DIAGNOSIS — J069 Acute upper respiratory infection, unspecified: Secondary | ICD-10-CM

## 2017-01-01 DIAGNOSIS — E559 Vitamin D deficiency, unspecified: Secondary | ICD-10-CM

## 2017-01-01 DIAGNOSIS — G4709 Other insomnia: Secondary | ICD-10-CM | POA: Diagnosis not present

## 2017-01-01 DIAGNOSIS — Z23 Encounter for immunization: Secondary | ICD-10-CM | POA: Diagnosis not present

## 2017-01-01 DIAGNOSIS — Z79899 Other long term (current) drug therapy: Secondary | ICD-10-CM

## 2017-01-01 DIAGNOSIS — E8881 Metabolic syndrome: Secondary | ICD-10-CM

## 2017-01-01 DIAGNOSIS — M1611 Unilateral primary osteoarthritis, right hip: Secondary | ICD-10-CM

## 2017-01-01 DIAGNOSIS — E785 Hyperlipidemia, unspecified: Secondary | ICD-10-CM

## 2017-01-01 DIAGNOSIS — J302 Other seasonal allergic rhinitis: Secondary | ICD-10-CM | POA: Diagnosis not present

## 2017-01-01 DIAGNOSIS — R232 Flushing: Secondary | ICD-10-CM

## 2017-01-01 DIAGNOSIS — J4521 Mild intermittent asthma with (acute) exacerbation: Secondary | ICD-10-CM

## 2017-01-01 DIAGNOSIS — T402X5A Adverse effect of other opioids, initial encounter: Secondary | ICD-10-CM

## 2017-01-01 DIAGNOSIS — R4586 Emotional lability: Secondary | ICD-10-CM

## 2017-01-01 DIAGNOSIS — K5903 Drug induced constipation: Secondary | ICD-10-CM

## 2017-01-01 MED ORDER — AZELASTINE-FLUTICASONE 137-50 MCG/ACT NA SUSP: 2.0000 | Freq: Every day | NASAL | 1 refills | Status: DC

## 2017-01-01 MED ORDER — DESVENLAFAXINE SUCCINATE ER 50 MG PO TB24: 50.0000 mg | ORAL_TABLET | Freq: Every day | ORAL | 1 refills | Status: DC

## 2017-01-01 MED ORDER — TOPIRAMATE 50 MG PO TABS: 25.0000 mg | ORAL_TABLET | Freq: Every evening | ORAL | 0 refills | Status: DC

## 2017-01-01 MED ORDER — LUBIPROSTONE 24 MCG PO CAPS: 24.0000 ug | ORAL_CAPSULE | Freq: Two times a day (BID) | ORAL | 1 refills | Status: DC

## 2017-01-01 NOTE — Progress Notes (Signed)
Name: Shelby Henson   MRN: [id number]    DOB: 1970-12-21   Date:01/01/2017       Progress Note  Subjective  Chief Complaint  Chief Complaint  Patient presents with  . Medication Refill    HPI  Insomnia: she has been struggling with sleep for a long time, she was sleeping well on Trazodone but had to stop because of pain medication given by pain clinic. She had right hip surgery Nov 2018 and has been sitting at home during the day and unable to fall and stay asleep.   Chronic hip pain: currently seeing pain management [facility]. Taking Oxycodone, denies sedation, she states she takes stools softeners to help with constipation, but bowel movements still about once a week. Pain is described as aching , dull pain, constant. Average pain since arthroscopic surgery 4/10, walking, but not doing rehab yet. Seeing pain clinic   Hot Flashes: she had a hysterectomy in 09/13/2008 - she has noticed hot flashes, mood swings, breast tenderness. She is doing well on Pristiq, stopped Neurontin because did not think it was working anymore   Asthma Mild Intermittent: currently no symptoms.Denies cough, wheezing or SOB.  Migraine: getting more frequent over the past month, episodes about 5 days a week, usually in am's. She denies caffeine intake. She states pain is throbbing, frontal, usually 8/10, but can be 10/10. Not responding to Excedrin migraine, taking multiple nsaid's and oxycodone by pain clinic for hip pain. Discussed rebound headaches also. Sometimes has photophobia, phonophobia, dizziness and intermittent nausea.  Mood Disorder: she is back on Pristiq and even though she is depressed because of pain, she is no longer as snappy or moody with family.   Metabolic Syndrome: diagnosed in March 2017, hgbA1C was 5.8% and lipid panel showed high triglycerides and low HDL, she has changed her diet, had lost weight, but gained a little weight since last visit    Patient Active Problem List   Diagnosis Date Noted  . Chronic pain 10/24/2015  . Hot flashes 10/24/2015  . Mood changes 10/24/2015  . Migraine without aura and without status migrainosus, not intractable 10/24/2015  . Metabolic syndrome 32/20/2542  . Dyslipidemia 10/24/2015  . Allergic rhinitis, seasonal 05/17/2015  . Asthma, mild intermittent 08/08/2014  . Chronic insomnia 08/08/2014  . DDD (degenerative disc disease), lumbar 08/08/2014  . Gastro-esophageal reflux disease without esophagitis 08/08/2014  . H/O total hysterectomy 08/08/2014  . Irritable bowel syndrome with diarrhea 08/08/2014  . Osteoarthritis of right hip 08/08/2014  . Adult BMI 30+ 08/08/2014  . SBO (spina bifida occulta) 08/08/2014    Past Surgical History:  Procedure Laterality Date  . ABDOMINAL HYSTERECTOMY    . CESAREAN SECTION    . CESAREAN SECTION      Family History  Problem Relation Age of Onset  . Bipolar disorder Brother   . Depression Mother     Social History   Socioeconomic History  . Marital status: Divorced    Spouse name: Not on file  . Number of children: Not on file  . Years of education: Not on file  . Highest education level: Not on file  Social Needs  . Financial resource strain: Not on file  . Food insecurity - worry: Not on file  . Food insecurity - inability: Not on file  . Transportation needs - medical: Not on file  . Transportation needs - non-medical: Not on file  Occupational History  . Not on file  Tobacco Use  . Smoking status:  Never Smoker  . Smokeless tobacco: Never Used  Substance and Sexual Activity  . Alcohol use: Yes    Alcohol/week: 0.0 oz    Comment: occasionally  . Drug use: No  . Sexual activity: Not Currently    Comment: boyfriend unable  Other Topics Concern  . Not on file  Social History Narrative  . Not on file     Current Outpatient Medications:  .  desvenlafaxine (PRISTIQ) 50 MG 24 hr tablet, Take 1 tablet (50 mg total) by mouth daily., Disp: 90 tablet, Rfl: 1 .   albuterol (PROVENTIL) (2.5 MG/3ML) 0.083% nebulizer solution, Take 3 mLs (2.5 mg total) by nebulization every 6 (six) hours as needed for wheezing or shortness of breath., Disp: 75 mL, Rfl: 2 .  aspirin 325 MG EC tablet, Take 1 tablet by mouth daily., Disp: , Rfl: 0 .  Azelastine-Fluticasone 137-50 MCG/ACT SUSP, Place 2 sprays into the nose daily., Disp: 23 g, Rfl: 1 .  lubiprostone (AMITIZA) 24 MCG capsule, Take 1 capsule (24 mcg total) by mouth 2 (two) times daily with a meal., Disp: 180 capsule, Rfl: 1 .  methocarbamol (ROBAXIN) 750 MG tablet, Take 750 mg by mouth 2 (two) times daily. [facility], Disp: , Rfl:  .  oxyCODONE (OXY IR/ROXICODONE) 5 MG immediate release tablet, Take 10 mg by mouth 2 (two) times daily. for pain, Disp: , Rfl: 0 .  ranitidine (ZANTAC) 150 MG tablet, Take 1 tablet (150 mg total) by mouth 2 (two) times daily., Disp: 180 tablet, Rfl: 1 .  topiramate (TOPAMAX) 50 MG tablet, Take 0.5-2 tablets (25-100 mg total) by mouth every evening. Increase by 25 mg every 3rd night to a max of 2 pills qhs, Disp: 60 tablet, Rfl: 0  Allergies  Allergen Reactions  . Prednisone     intolerance, makes her angry. Makes her angry  . Zolpidem   . Codeine Rash  . Penicillins Rash     ROS  Constitutional: Negative for fever or weight change.  Respiratory: Negative for cough and shortness of breath.   Cardiovascular: Negative for chest pain or palpitations.  Gastrointestinal: Negative for abdominal pain, no bowel changes.  Musculoskeletal: Positive  for gait problem but no  joint swelling.  Skin: Negative for rash.  Neurological: positive for  Dizziness occasionally during migraine episode, positive for recurrent  headache.  No other specific complaints in a complete review of systems (except as listed in HPI above).  Objective  Vitals:   01/01/17 1036  BP: 114/80  Pulse: 92  Resp: 14  SpO2: 98%  Weight: 158 lb 4.8 oz (71.8 kg)  Height: 4\' 11"  (1.499 m)    Body mass index  is 31.97 kg/m.  Physical Exam  Constitutional: Patient appears well-developed and well-nourished. Obese  No distress.  HEENT: head atraumatic, normocephalic, pupils equal and reactive to light, neck supple, throat within normal limits Cardiovascular: Normal rate, regular rhythm and normal heart sounds.  No murmur heard. No BLE edema. Pulmonary/Chest: Effort normal and breath sounds normal. No respiratory distress. Abdominal: Soft.  There is no tenderness. Psychiatric: Patient has a normal mood and affect. behavior is normal. Judgment and thought content normal. Neurological: no focal findings.   PHQ2/9: Depression screen [facility] 2/9 05/14/2016 10/24/2015 10/24/2015 05/17/2015 08/11/2014  Decreased Interest 0 0 0 0 0  Down, Depressed, Hopeless 0 0 0 0 0  PHQ - 2 Score 0 0 0 0 0    Fall Risk: Fall Risk  01/01/2017 05/14/2016 10/24/2015 05/17/2015 08/11/2014  Falls in the past year? No No No No Yes  Number falls in past yr: - - - - 1  Injury with Fall? - - - - Yes  Comment - - - - sprained right ankle     Functional Status Survey: Is the patient deaf or have difficulty hearing?: No Does the patient have difficulty seeing, even when wearing glasses/contacts?: No Does the patient have difficulty concentrating, remembering, or making decisions?: No Does the patient have difficulty walking or climbing stairs?: No Does the patient have difficulty dressing or bathing?: No Does the patient have difficulty doing errands alone such as visiting a doctor's office or shopping?: No    Assessment & Plan  1. Gastro-esophageal reflux disease without esophagitis  Continue ranitidine, doing well   2. Migraine without aura and without status migrainosus, not intractable  Not controlled, off gabapentin, but willing to try Topamax, discussed importance of titrating up and down slowly, possible side effects.  - topiramate (TOPAMAX) 50 MG tablet; Take 0.5-2 tablets (25-100 mg total) by mouth every evening.  Increase by 25 mg every 3rd night to a max of 2 pills qhs  Dispense: 60 tablet; Refill: 0  3. Metabolic syndrome  - Hemoglobin A1c - Insulin, random  4. Mild intermittent asthma with acute exacerbation  Spirometry next visit  5. Dyslipidemia  - Lipid panel  6. Other insomnia  We will try Topamax and see if it will help with sleep   7. Primary osteoarthritis of right hip  Recent hip surgery and is seeing pain clinic   8. Mood changes  - desvenlafaxine (PRISTIQ) 50 MG 24 hr tablet; Take 1 tablet (50 mg total) by mouth daily.  Dispense: 90 tablet; Refill: 1  9. Hot flashes  - desvenlafaxine (PRISTIQ) 50 MG 24 hr tablet; Take 1 tablet (50 mg total) by mouth daily.  Dispense: 90 tablet; Refill: 1  10. Constipation due to opioid therapy  - lubiprostone (AMITIZA) 24 MCG capsule; Take 1 capsule (24 mcg total) by mouth 2 (two) times daily with a meal.  Dispense: 180 capsule; Refill: 1  11. Seasonal allergic rhinitis, unspecified trigger  - Azelastine-Fluticasone 137-50 MCG/ACT SUSP; Place 2 sprays into the nose daily.  Dispense: 23 g; Refill: 1  12. Vitamin D deficiency  - VITAMIN D 25 Hydroxy (Vit-D Deficiency, Fractures)  13. Long-term use of high-risk medication  - CBC with Differential/Platelet - COMPLETE METABOLIC PANEL WITH GFR  14. Need for 23-polyvalent pneumococcal polysaccharide vaccine  - Pneumococcal polysaccharide vaccine 23-valent greater than or equal to 2yo subcutaneous/IM

## 2017-01-01 NOTE — Telephone Encounter (Signed)
Refill request for general medication: Ranitidine 150 mg  Last office visit: 01/01/2017  Last physical exam: 05/17/2015  Follow up visit: 02/04/2017

## 2017-01-04 ENCOUNTER — Other Ambulatory Visit: Payer: Self-pay | Admitting: Family Medicine

## 2017-01-04 DIAGNOSIS — K219 Gastro-esophageal reflux disease without esophagitis: Secondary | ICD-10-CM

## 2017-01-08 ENCOUNTER — Encounter: Payer: Self-pay | Admitting: Family Medicine

## 2017-01-08 ENCOUNTER — Other Ambulatory Visit: Payer: Self-pay | Admitting: Family Medicine

## 2017-01-08 DIAGNOSIS — D649 Anemia, unspecified: Secondary | ICD-10-CM | POA: Insufficient documentation

## 2017-01-09 LAB — LIPID PANEL
Cholesterol: 195 mg/dL (ref <200)
HDL: 50 mg/dL — AB (ref >50)
LDL CHOLESTEROL (CALC): 124 mg/dL — AB
Non-HDL Cholesterol (Calc): 145 mg/dL (calc) — ABNORMAL HIGH (ref <130)
TRIGLYCERIDES: 104 mg/dL (ref <150)
Total CHOL/HDL Ratio: 3.9 (calc) (ref <5.0)

## 2017-01-09 LAB — INSULIN, RANDOM: Insulin: 5.3 u[IU]/mL (ref 2.0–19.6)

## 2017-01-09 LAB — CBC WITH DIFFERENTIAL/PLATELET
BASOS PCT: 1.1 %
Basophils Absolute: 72 cells/uL (ref 0–200)
EOS PCT: 13 %
Eosinophils Absolute: 845 cells/uL — ABNORMAL HIGH (ref 15–500)
HCT: 34.4 % — ABNORMAL LOW (ref 35.0–45.0)
Hemoglobin: 11.6 g/dL — ABNORMAL LOW (ref 11.7–15.5)
Lymphs Abs: 1736 cells/uL (ref 850–3900)
MCH: 28.4 pg (ref 27.0–33.0)
MCHC: 33.7 g/dL (ref 32.0–36.0)
MCV: 84.3 fL (ref 80.0–100.0)
MONOS PCT: 6.4 %
MPV: 12.2 fL (ref 7.5–12.5)
Neutro Abs: 3432 cells/uL (ref 1500–7800)
Neutrophils Relative %: 52.8 %
PLATELETS: 225 10*3/uL (ref 140–400)
RBC: 4.08 10*6/uL (ref 3.80–5.10)
RDW: 13.2 % (ref 11.0–15.0)
TOTAL LYMPHOCYTE: 26.7 %
WBC mixed population: 416 cells/uL (ref 200–950)
WBC: 6.5 10*3/uL (ref 3.8–10.8)

## 2017-01-09 LAB — VITAMIN D 25 HYDROXY (VIT D DEFICIENCY, FRACTURES): Vit D, 25-Hydroxy: 25 ng/mL — ABNORMAL LOW (ref 30–100)

## 2017-01-09 LAB — TEST AUTHORIZATION

## 2017-01-09 LAB — IRON,TIBC AND FERRITIN PANEL
%SAT: 18 % (calc) (ref 11–50)
FERRITIN: 69 ng/mL (ref 10–232)
IRON: 58 ug/dL (ref 40–190)
TIBC: 319 ug/dL (ref 250–450)

## 2017-01-09 LAB — COMPLETE METABOLIC PANEL WITH GFR
AG Ratio: 1.4 (calc) (ref 1.0–2.5)
ALT: 16 U/L (ref 6–29)
AST: 16 U/L (ref 10–35)
Albumin: 4.1 g/dL (ref 3.6–5.1)
Alkaline phosphatase (APISO): 92 U/L (ref 33–115)
BUN: 17 mg/dL (ref 7–25)
CALCIUM: 9.2 mg/dL (ref 8.6–10.2)
CHLORIDE: 106 mmol/L (ref 98–110)
CO2: 29 mmol/L (ref 20–32)
Creat: 0.55 mg/dL (ref 0.50–1.10)
GFR, EST NON AFRICAN AMERICAN: 112 mL/min/{1.73_m2} (ref >=60)
GFR, Est African American: 129 mL/min/{1.73_m2} (ref >=60)
GLUCOSE: 85 mg/dL (ref 65–99)
Globulin: 3 g/dL (calc) (ref 1.9–3.7)
Potassium: 4.2 mmol/L (ref 3.5–5.3)
Sodium: 141 mmol/L (ref 135–146)
TOTAL PROTEIN: 7.1 g/dL (ref 6.1–8.1)
Total Bilirubin: 0.4 mg/dL (ref 0.2–1.2)

## 2017-01-09 LAB — HEMOGLOBIN A1C
Hgb A1c MFr Bld: 5.3 % of total Hgb (ref <5.7)
Mean Plasma Glucose: 105 (calc)
eAG (mmol/L): 5.8 (calc)

## 2017-02-04 ENCOUNTER — Ambulatory Visit: Payer: BLUE CROSS/BLUE SHIELD | Admitting: Family Medicine

## 2017-02-04 ENCOUNTER — Encounter: Payer: Self-pay | Admitting: Family Medicine

## 2017-02-04 VITALS — BP 112/62 | HR 79 | Temp 98.2°F | Resp 18 | Ht 59.0 in | Wt 158.8 lb

## 2017-02-04 DIAGNOSIS — G43009 Migraine without aura, not intractable, without status migrainosus: Secondary | ICD-10-CM

## 2017-02-04 DIAGNOSIS — R4586 Emotional lability: Secondary | ICD-10-CM | POA: Diagnosis not present

## 2017-02-04 DIAGNOSIS — J4520 Mild intermittent asthma, uncomplicated: Secondary | ICD-10-CM | POA: Diagnosis not present

## 2017-02-04 MED ORDER — DIVALPROEX SODIUM 250 MG PO DR TAB: 250.0000 mg | DELAYED_RELEASE_TABLET | Freq: Every day | ORAL | 0 refills | Status: DC

## 2017-02-04 MED ORDER — ALBUTEROL SULFATE (2.5 MG/3ML) 0.083% IN NEBU: 2.5000 mg | INHALATION_SOLUTION | Freq: Four times a day (QID) | RESPIRATORY_TRACT | 2 refills | Status: DC | PRN

## 2017-02-04 NOTE — Progress Notes (Signed)
Name: Shelby Henson   MRN: [id number]    DOB: 1970-08-05   Date:02/04/2017       Progress Note  Subjective  Chief Complaint  Chief Complaint  Patient presents with  . Follow-up    1 month F/U  . Migraine    Had to stop Topamax due to the medication making her excessive moody- stop 3 day afterwards. Could tell a difference after taking but could not tolerate medication. Patient states she has had 1 migraine in the past 2 weeks. Stress makes them works-cold compress and laying down helps them.     HPI   Migraine: she was seen Dec 2018 and stated it was getting more frequent over the previous  month, episodes about 5 days a week, usually in am's. She denies caffeine intake. She states pain is throbbing, frontal, usually 8/10, but can be 10/10. Not responding to Excedrin migraine, taking multiple nsaid's and oxycodone by pain clinic for hip pain. Discussed rebound headaches also. Sometimes has photophobia, phonophobia, dizziness and intermittent nausea.She tried Topamax however caused severe mood swings and she stopped taking it within the first three days. She states episodes not as often now and states did not tolerate gabapentin in the past. Discussed migraine clinic, but she wants to hold off for now.   Asthma Mild Intermittent: currently no symptoms.Denies cough, wheezing or SOB.  Major Depression:she has been on Pritiq and there is family history of bipolar, she also has migraines and we will try adding Depakote  Patient Active Problem List   Diagnosis Date Noted  . Anemia, unspecified 01/08/2017  . Chronic pain 10/24/2015  . Hot flashes 10/24/2015  . Mood changes 10/24/2015  . Migraine without aura and without status migrainosus, not intractable 10/24/2015  . Metabolic syndrome 11/14/5100  . Dyslipidemia 10/24/2015  . Allergic rhinitis, seasonal 05/17/2015  . Asthma, mild intermittent 08/08/2014  . Chronic insomnia 08/08/2014  . DDD (degenerative disc disease), lumbar 08/08/2014   . Gastro-esophageal reflux disease without esophagitis 08/08/2014  . H/O total hysterectomy 08/08/2014  . Irritable bowel syndrome with diarrhea 08/08/2014  . Osteoarthritis of right hip 08/08/2014  . Adult BMI 30+ 08/08/2014  . SBO (spina bifida occulta) 08/08/2014    Past Surgical History:  Procedure Laterality Date  . ABDOMINAL HYSTERECTOMY    . CESAREAN SECTION    . CESAREAN SECTION      Family History  Problem Relation Age of Onset  . Bipolar disorder Brother   . Depression Mother     Social History   Socioeconomic History  . Marital status: Divorced    Spouse name: Not on file  . Number of children: Not on file  . Years of education: Not on file  . Highest education level: Not on file  Social Needs  . Financial resource strain: Not on file  . Food insecurity - worry: Not on file  . Food insecurity - inability: Not on file  . Transportation needs - medical: Not on file  . Transportation needs - non-medical: Not on file  Occupational History  . Not on file  Tobacco Use  . Smoking status: Never Smoker  . Smokeless tobacco: Never Used  Substance and Sexual Activity  . Alcohol use: Yes    Alcohol/week: 0.0 oz    Comment: occasionally  . Drug use: No  . Sexual activity: Not Currently    Comment: boyfriend unable  Other Topics Concern  . Not on file  Social History Narrative  . Not on file  Current Outpatient Medications:  .  albuterol (PROVENTIL) (2.5 MG/3ML) 0.083% nebulizer solution, Take 3 mLs (2.5 mg total) by nebulization every 6 (six) hours as needed for wheezing or shortness of breath., Disp: 75 mL, Rfl: 2 .  desvenlafaxine (PRISTIQ) 50 MG 24 hr tablet, Take 1 tablet (50 mg total) by mouth daily., Disp: 90 tablet, Rfl: 1 .  Ibuprofen-Famotidine (DUEXIS) 800-26.6 MG TABS, Take 3 tablets by mouth daily., Disp: , Rfl:  .  lubiprostone (AMITIZA) 24 MCG capsule, Take 1 capsule (24 mcg total) by mouth 2 (two) times daily with a meal., Disp: 180 capsule,  Rfl: 1 .  oxyCODONE (OXY IR/ROXICODONE) 5 MG immediate release tablet, Take 10 mg by mouth 2 (two) times daily. for pain, Disp: , Rfl: 0 .  ranitidine (ZANTAC) 150 MG tablet, TAKE 1 TABLET (150 MG TOTAL) BY MOUTH 2 (TWO) TIMES DAILY., Disp: 180 tablet, Rfl: 1 .  Azelastine-Fluticasone 137-50 MCG/ACT SUSP, Place 2 sprays into the nose daily. (Patient not taking: Reported on 02/04/2017), Disp: 23 g, Rfl: 1  Allergies  Allergen Reactions  . Prednisone     intolerance, makes her angry. Makes her angry  . Zolpidem   . Codeine Rash  . Penicillins Rash     ROS  Ten systems reviewed and is negative except as mentioned in HPI   Objective  Vitals:   02/04/17 1038  BP: 112/62  Pulse: 79  Resp: 18  Temp: 98.2 F (36.8 C)  TempSrc: Oral  SpO2: 99%  Weight: 158 lb 12.8 oz (72 kg)  Height: 4\' 11"  (1.499 m)    Body mass index is 32.07 kg/m.  Physical Exam  Constitutional: Patient appears well-developed and well-nourished. Obese No distress.  HEENT: head atraumatic, normocephalic, pupils equal and reactive to light, neck supple, throat within normal limits Cardiovascular: Normal rate, regular rhythm and normal heart sounds.  No murmur heard. No BLE edema. Pulmonary/Chest: Effort normal and breath sounds normal. No respiratory distress. Abdominal: Soft.  There is no tenderness. Psychiatric: Patient has a normal mood and affect. behavior is normal. Judgment and thought content normal. Muscular Skeletal: mild antalgic gait, good rom of both hips   Recent Results (from the past 2160 hour(s))  CBC with Differential/Platelet     Status: Abnormal   Collection Time: 01/03/17 11:25 AM  Result Value Ref Range   WBC 6.5 3.8 - 10.8 Thousand/uL   RBC 4.08 3.80 - 5.10 Million/uL   Hemoglobin 11.6 (L) 11.7 - 15.5 g/dL   HCT 34.4 (L) 35.0 - 45.0 %   MCV 84.3 80.0 - 100.0 fL   MCH 28.4 27.0 - 33.0 pg   MCHC 33.7 32.0 - 36.0 g/dL   RDW 13.2 11.0 - 15.0 %   Platelets 225 140 - 400 Thousand/uL    MPV 12.2 7.5 - 12.5 fL   Neutro Abs 3,432 1,500 - 7,800 cells/uL   Lymphs Abs 1,736 850 - 3,900 cells/uL   WBC mixed population 416 200 - 950 cells/uL   Eosinophils Absolute 845 (H) 15 - 500 cells/uL   Basophils Absolute 72 0 - 200 cells/uL   Neutrophils Relative % 52.8 %   Total Lymphocyte 26.7 %   Monocytes Relative 6.4 %   Eosinophils Relative 13.0 %   Basophils Relative 1.1 %  COMPLETE METABOLIC PANEL WITH GFR     Status: None   Collection Time: 01/03/17 11:25 AM  Result Value Ref Range   Glucose, Bld 85 65 - 99 mg/dL    Comment: .  Fasting reference interval .    BUN 17 7 - 25 mg/dL   Creat 0.55 0.50 - 1.10 mg/dL   GFR, Est Non African American 112 > OR = 60 mL/min/1.69m2   GFR, Est African American 129 > OR = 60 mL/min/1.68m2   BUN/Creatinine Ratio NOT APPLICABLE 6 - 22 (calc)   Sodium 141 135 - 146 mmol/L   Potassium 4.2 3.5 - 5.3 mmol/L   Chloride 106 98 - 110 mmol/L   CO2 29 20 - 32 mmol/L   Calcium 9.2 8.6 - 10.2 mg/dL   Total Protein 7.1 6.1 - 8.1 g/dL   Albumin 4.1 3.6 - 5.1 g/dL   Globulin 3.0 1.9 - 3.7 g/dL (calc)   AG Ratio 1.4 1.0 - 2.5 (calc)   Total Bilirubin 0.4 0.2 - 1.2 mg/dL   Alkaline phosphatase (APISO) 92 33 - 115 U/L   AST 16 10 - 35 U/L   ALT 16 6 - 29 U/L  Hemoglobin A1c     Status: None   Collection Time: 01/03/17 11:25 AM  Result Value Ref Range   Hgb A1c MFr Bld 5.3 <5.7 % of total Hgb    Comment: For the purpose of screening for the presence of diabetes: . <5.7%       Consistent with the absence of diabetes 5.7-6.4%    Consistent with increased risk for diabetes             (prediabetes) > or =6.5%  Consistent with diabetes . This assay result is consistent with a decreased risk of diabetes. . Currently, no consensus exists regarding use of hemoglobin A1c for diagnosis of diabetes in children. . According to American Diabetes Association (ADA) guidelines, hemoglobin A1c <7.0% represents optimal control in  non-pregnant diabetic patients. Different metrics may apply to specific patient populations.  Standards of Medical Care in Diabetes(ADA). .    Mean Plasma Glucose 105 (calc)   eAG (mmol/L) 5.8 (calc)  Lipid panel     Status: Abnormal   Collection Time: 01/03/17 11:25 AM  Result Value Ref Range   Cholesterol 195 <200 mg/dL   HDL 50 (L) >50 mg/dL   Triglycerides 104 <150 mg/dL   LDL Cholesterol (Calc) 124 (H) mg/dL (calc)    Comment: Reference range: <100 . Desirable range <100 mg/dL for primary prevention;   <70 mg/dL for patients with CHD or diabetic patients  with > or = 2 CHD risk factors. Marland Kitchen LDL-C is now calculated using the Martin-Hopkins  calculation, which is a validated novel method providing  better accuracy than the Friedewald equation in the  estimation of LDL-C.  Cresenciano Genre et al. Annamaria Helling. 3300;762(26): 2061-2068  (http://education.[facility].com/faq/FAQ164)    Total CHOL/HDL Ratio 3.9 <5.0 (calc)   Non-HDL Cholesterol (Calc) 145 (H) <130 mg/dL (calc)    Comment: For patients with diabetes plus 1 major ASCVD risk  factor, treating to a non-HDL-C goal of <100 mg/dL  (LDL-C of <70 mg/dL) is considered a therapeutic  option.   Insulin, random     Status: None   Collection Time: 01/03/17 11:25 AM  Result Value Ref Range   Insulin 5.3 2.0 - 19.6 uIU/mL    Comment: This insulin assay shows strong cross-reactivity for some insulin analogs (lispro, aspart, and glargine) and much lower cross-reactivity with others (detemir, glulisine).   VITAMIN D 25 Hydroxy (Vit-D Deficiency, Fractures)     Status: Abnormal   Collection Time: 01/03/17 11:25 AM  Result Value Ref Range   Vit D, 25-Hydroxy 25 (  L) 30 - 100 ng/mL    Comment: Vitamin D Status         25-OH Vitamin D: . Deficiency:                    <20 ng/mL Insufficiency:             20 - 29 ng/mL Optimal:                 > or = 30 ng/mL . For 25-OH Vitamin D testing on patients on  D2-supplementation and  patients for whom quantitation  of D2 and D3 fractions is required, the QuestAssureD(TM) 25-OH VIT D, (D2,D3), LC/MS/MS is recommended: order  code [phone] (patients >28yrs). . For more information on this test, go to: http://education.[facility].com/faq/FAQ163 (This link is being provided for  informational/educational purposes only.)   Iron, TIBC and Ferritin Panel     Status: None   Collection Time: 01/03/17 11:25 AM  Result Value Ref Range   Iron 58 40 - 190 mcg/dL   TIBC 319 250 - 450 mcg/dL (calc)   %SAT 18 11 - 50 % (calc)   Ferritin 69 10 - 232 ng/mL  TEST AUTHORIZATION     Status: None   Collection Time: 01/03/17 11:25 AM  Result Value Ref Range   TEST NAME: IRON, TIBC AND FERRITIN PANEL    TEST CODE: [id number]    CLIENT CONTACT: Willadeen Colantuono    REPORT ALWAYS MESSAGE SIGNATURE      Comment: . The laboratory testing on this patient was verbally requested or confirmed by the ordering physician or his or her authorized representative after contact with an employee of [employer]. Federal regulations require that we maintain on file written authorization for all laboratory testing.  Accordingly we are asking that the ordering physician or his or her authorized representative sign a copy of this report and promptly return it to the client service representative. . . Signature:____________________________________________________ . Please fax this signed page to [phone] or return it via your [employer] courier.      PHQ2/9: Depression screen [facility] 2/9 02/04/2017 05/14/2016 10/24/2015 10/24/2015 05/17/2015  Decreased Interest 3 0 0 0 0  Down, Depressed, Hopeless 2 0 0 0 0  PHQ - 2 Score 5 0 0 0 0  Altered sleeping 3 - - - -  Tired, decreased energy 3 - - - -  Change in appetite 0 - - - -  Feeling bad or failure about yourself  0 - - - -  Trouble concentrating 1 - - - -  Moving slowly or fidgety/restless 0 - - - -  Suicidal thoughts 0 - - - -   PHQ-9 Score 12 - - - -  Difficult doing work/chores Very difficult - - - -     Fall Risk: Fall Risk  02/04/2017 01/01/2017 05/14/2016 10/24/2015 05/17/2015  Falls in the past year? No No No No No  Number falls in past yr: - - - - -  Injury with Fall? - - - - -  [place]  1. Migraine without aura and without status migrainosus, not intractable  She is willing to try Depakote at this time  2. Mild intermittent asthma in adult without complication  - albuterol (PROVENTIL) (2.5 MG/3ML) 0.083% nebulizer solution; Take 3 mLs (2.5 mg total) by nebulization every 6 (six) hours as needed for wheezing or shortness of breath.  Dispense: 75  mL; Refill: 2  3. Mood changes  Family history of bipolar, she states she wants to continue Pristiq for now, states very upset about finances right now, cannot go back to work yet, and does not have short term disability - for her recent hip surgery about 21months ago.   - divalproex (DEPAKOTE) 250 MG DR tablet; Take 1 tablet (250 mg total) by mouth at bedtime.  Dispense: 30 tablet; Refill: 0 Because of cost, she will call back or send a chart message stating if it works and if she would like a refill

## 2017-03-01 ENCOUNTER — Other Ambulatory Visit: Payer: Self-pay

## 2017-03-01 DIAGNOSIS — G43009 Migraine without aura, not intractable, without status migrainosus: Secondary | ICD-10-CM

## 2017-03-01 DIAGNOSIS — R4586 Emotional lability: Secondary | ICD-10-CM

## 2017-03-01 MED ORDER — DIVALPROEX SODIUM 250 MG PO DR TAB: 250.0000 mg | DELAYED_RELEASE_TABLET | Freq: Every day | ORAL | 2 refills | Status: DC

## 2017-03-01 NOTE — Telephone Encounter (Signed)
Refill request for general medication: Divalproex sodium Dr 250 mg  Last office visit: 02/04/2017  Last physical exam: 05/17/2015  Follow-up on file. 05/06/2017

## 2017-05-06 ENCOUNTER — Ambulatory Visit: Payer: BLUE CROSS/BLUE SHIELD | Admitting: Family Medicine

## 2017-05-06 ENCOUNTER — Encounter: Payer: Self-pay | Admitting: Obstetrics & Gynecology

## 2017-05-07 ENCOUNTER — Ambulatory Visit: Payer: Self-pay | Admitting: Obstetrics & Gynecology

## 2017-07-23 ENCOUNTER — Other Ambulatory Visit: Payer: Self-pay | Admitting: Family Medicine

## 2017-07-23 DIAGNOSIS — R4586 Emotional lability: Secondary | ICD-10-CM

## 2017-07-23 DIAGNOSIS — R232 Flushing: Secondary | ICD-10-CM

## 2017-07-23 NOTE — Telephone Encounter (Signed)
Needs follow up, last refill.

## 2017-07-23 NOTE — Telephone Encounter (Signed)
[id number]  Called [phone] @ 9:12 and left voice message informing pt that prescription has been sent to pharmacy however she is needing to schedule appt with sowles or elizabeth within next month

## 2017-07-30 ENCOUNTER — Telehealth: Payer: Self-pay | Admitting: Family Medicine

## 2017-07-30 NOTE — Telephone Encounter (Signed)
Copied from [place] [id number]. Topic: Quick Communication - Rx Refill/Question >> Jul 30, 2017  1:33 PM Boyd Kerbs wrote: Medication:  Pt is asking if dr. Lilian Coma change  her prescription from desvenlafaxine (PRISTIQ) 50 MG 24 hr tablet to Effexor  Pt does not have ins. At this time and Effexor is cheaper  Has the patient contacted their pharmacy? No. (Agent: If no, request that the patient contact the pharmacy for the refill.) (Agent: If yes, when and what did the pharmacy advise?)  Preferred Pharmacy (with phone number or street name):   [id number] - [place], [address] [place] [address] Phone: [phone] Fax: [phone]    Agent: Please be advised that RX refills may take up to 3 business days. We ask that you follow-up with your pharmacy.

## 2017-07-30 NOTE — Telephone Encounter (Signed)
Please advise 

## 2017-08-02 NOTE — Telephone Encounter (Signed)
Called [phone] and left voice message informing to schedule appt with Dr Ancil Boozer

## 2017-08-02 NOTE — Telephone Encounter (Signed)
She needs an appointment. We have not seen her since January. I don't like switching medication without seeing patient.

## 2017-08-02 NOTE — Telephone Encounter (Signed)
Please schedule patient.

## 2017-08-06 ENCOUNTER — Encounter: Payer: Self-pay | Admitting: Family Medicine

## 2017-09-03 ENCOUNTER — Ambulatory Visit (INDEPENDENT_AMBULATORY_CARE_PROVIDER_SITE_OTHER): Payer: Self-pay | Admitting: Family Medicine

## 2017-09-03 ENCOUNTER — Encounter: Payer: Self-pay | Admitting: Family Medicine

## 2017-09-03 VITALS — BP 124/72 | HR 100 | Temp 97.8°F | Resp 18 | Ht 59.0 in | Wt 171.3 lb

## 2017-09-03 DIAGNOSIS — D649 Anemia, unspecified: Secondary | ICD-10-CM

## 2017-09-03 DIAGNOSIS — G8929 Other chronic pain: Secondary | ICD-10-CM

## 2017-09-03 DIAGNOSIS — Z598 Other problems related to housing and economic circumstances: Secondary | ICD-10-CM

## 2017-09-03 DIAGNOSIS — R232 Flushing: Secondary | ICD-10-CM

## 2017-09-03 DIAGNOSIS — M1611 Unilateral primary osteoarthritis, right hip: Secondary | ICD-10-CM

## 2017-09-03 DIAGNOSIS — J4520 Mild intermittent asthma, uncomplicated: Secondary | ICD-10-CM

## 2017-09-03 DIAGNOSIS — E559 Vitamin D deficiency, unspecified: Secondary | ICD-10-CM

## 2017-09-03 DIAGNOSIS — Z599 Problem related to housing and economic circumstances, unspecified: Secondary | ICD-10-CM

## 2017-09-03 DIAGNOSIS — F39 Unspecified mood [affective] disorder: Secondary | ICD-10-CM

## 2017-09-03 DIAGNOSIS — G4709 Other insomnia: Secondary | ICD-10-CM

## 2017-09-03 DIAGNOSIS — G43009 Migraine without aura, not intractable, without status migrainosus: Secondary | ICD-10-CM

## 2017-09-03 MED ORDER — AMITRIPTYLINE HCL 25 MG PO TABS: 25.0000 mg | ORAL_TABLET | Freq: Every day | ORAL | 5 refills | Status: DC

## 2017-09-03 MED ORDER — DESVENLAFAXINE SUCCINATE ER 50 MG PO TB24: 50.0000 mg | ORAL_TABLET | Freq: Every day | ORAL | 5 refills | Status: DC

## 2017-09-03 NOTE — Progress Notes (Signed)
Name: Shelby Henson   MRN: [id number]    DOB: 1970-03-06   Date:09/03/2017       Progress Note  Subjective  Chief Complaint  Chief Complaint  Patient presents with  . Medication Refill  . Migraine    Stress has been making it worst-more frequent and intensity is the same-nausea  . Asthma  . Depression    HPI   Migraine: she states having more frequent episodes over the past month. Almost daily for the past week.  She states seems to be worse in the evenings.. She denies caffeine intake. She states pain is throbbing, frontal, usually 8/10, but can be 10/10. She states Excedrin helps but it aggravates her stomach. Taking Tylenol now.  Discussed rebound headaches because of oxycodone use for chronic right hip pain..Sometimes has photophobia, seldom has  phonophobia, dizzinessand intermittent nausea. She tried Topamax however caused severe mood swings and she stopped taking it within the first three days. She states episodes not as often now and states did not tolerate gabapentin in the past. She cannot afford going to headache center at this time  Asthma Mild Intermittent: currently no symptoms.Denies cough, wheezing or SOB. Doing well at this time  Major Depression:she has been on Pritiq for many years, she has a family history of bipolar, we tried Depakote but she is not sure why she stopped. She has been unemployed since Nov 2018, struggling financially but she thinks mood is worse because of daily pain on right hip and inability to function. She denies suicidal thoughts or ideation  Chronic hip pain: going to [facility] pain clinic, but trying to go to [facility] because of indigent care program to have right hip replacement. Taking oxydone daily. Pain is constant at a 5-6/10.    Patient Active Problem List   Diagnosis Date Noted  . Anemia, unspecified 01/08/2017  . Chronic pain 10/24/2015  . Hot flashes 10/24/2015  . Mood changes 10/24/2015  . Migraine without aura and without status  migrainosus, not intractable 10/24/2015  . Metabolic syndrome 14/43/1540  . Dyslipidemia 10/24/2015  . Allergic rhinitis, seasonal 05/17/2015  . Asthma, mild intermittent 08/08/2014  . Chronic insomnia 08/08/2014  . DDD (degenerative disc disease), lumbar 08/08/2014  . Mood disorder ([place]) 08/08/2014  . Gastro-esophageal reflux disease without esophagitis 08/08/2014  . H/O total hysterectomy 08/08/2014  . Irritable bowel syndrome with diarrhea 08/08/2014  . Osteoarthritis of right hip 08/08/2014  . Adult BMI 30+ 08/08/2014  . SBO (spina bifida occulta) 08/08/2014    Past Surgical History:  Procedure Laterality Date  . ABDOMINAL HYSTERECTOMY    . ABLATION    . CESAREAN SECTION    . CESAREAN SECTION    . LEEP    . TOTAL VAGINAL HYSTERECTOMY      Family History  Problem Relation Age of Onset  . Bipolar disorder Brother   . Depression Mother     Social History   Socioeconomic History  . Marital status: Divorced    Spouse name: Not on file  . Number of children: Not on file  . Years of education: Not on file  . Highest education level: Not on file  Occupational History  . Not on file  Social Needs  . Financial resource strain: Not on file  . Food insecurity:    Worry: Not on file    Inability: Not on file  . Transportation needs:    Medical: Not on file    Non-medical: Not on file  Tobacco Use  .  Smoking status: Never Smoker  . Smokeless tobacco: Never Used  Substance and Sexual Activity  . Alcohol use: Yes    Alcohol/week: 0.0 oz    Comment: occasionally  . Drug use: No  . Sexual activity: Not Currently    Comment: boyfriend unable  Lifestyle  . Physical activity:    Days per week: Not on file    Minutes per session: Not on file  . Stress: Not on file  Relationships  . Social connections:    Talks on phone: Not on file    Gets together: Not on file    Attends religious service: Not on file    Active member of club or organization: Not on file     Attends meetings of clubs or organizations: Not on file    Relationship status: Not on file  . Intimate partner violence:    Fear of current or ex partner: Not on file    Emotionally abused: Not on file    Physically abused: Not on file    Forced sexual activity: Not on file  Other Topics Concern  . Not on file  Social History Narrative  . Not on file     Current Outpatient Medications:  .  albuterol (PROVENTIL) (2.5 MG/3ML) 0.083% nebulizer solution, Take 3 mLs (2.5 mg total) by nebulization every 6 (six) hours as needed for wheezing or shortness of breath., Disp: 75 mL, Rfl: 2 .  desvenlafaxine (PRISTIQ) 50 MG 24 hr tablet, TAKE 1 TABLET BY MOUTH EVERY DAY, Disp: 30 tablet, Rfl: 0 .  Oxycodone HCl 10 MG TABS, TAKE 1 TABLET BY MOUTH TWICE A DAY**FILL 07/31, Disp: , Rfl: 0 .  ranitidine (ZANTAC) 150 MG tablet, TAKE 1 TABLET (150 MG TOTAL) BY MOUTH 2 (TWO) TIMES DAILY., Disp: 180 tablet, Rfl: 1 .  Azelastine-Fluticasone 137-50 MCG/ACT SUSP, Place 2 sprays into the nose daily. (Patient not taking: Reported on 09/03/2017), Disp: 23 g, Rfl: 1 .  divalproex (DEPAKOTE) 250 MG DR tablet, Take 1 tablet (250 mg total) by mouth at bedtime. (Patient not taking: Reported on 09/03/2017), Disp: 30 tablet, Rfl: 2 .  Ibuprofen-Famotidine (DUEXIS) 800-26.6 MG TABS, Take 3 tablets by mouth daily., Disp: , Rfl:  .  lubiprostone (AMITIZA) 24 MCG capsule, Take 1 capsule (24 mcg total) by mouth 2 (two) times daily with a meal. (Patient not taking: Reported on 09/03/2017), Disp: 180 capsule, Rfl: 1  Allergies  Allergen Reactions  . Celecoxib Hives  . Prednisone     intolerance, makes her angry. Makes her angry  . Zolpidem   . Codeine Rash  . Penicillins Rash     ROS  Constitutional: Negative for fever , positive for weight change - gained .  Respiratory: Negative for cough and shortness of breath.   Cardiovascular: Negative for chest pain or palpitations.  Gastrointestinal: Negative for abdominal pain,  no bowel changes.  Musculoskeletal: Positive  for gait problem but no  joint swelling.  Skin: Negative for rash.  Neurological: Negative for dizziness, positive for headache.  No other specific complaints in a complete review of systems (except as listed in HPI above).  Objective  Vitals:   09/03/17 0957  BP: 124/72  Pulse: 100  Resp: 18  Temp: 97.8 F (36.6 C)  TempSrc: Oral  SpO2: 96%  Weight: 171 lb 4.8 oz (77.7 kg)  Height: 4\' 11"  (1.499 m)    Body mass index is 34.6 kg/m.  Physical Exam  Constitutional: Patient appears well-developed and well-nourished. Obese  No distress.  HEENT: head atraumatic, normocephalic, pupils equal and reactive to light, eneck supple, throat within normal limits Cardiovascular: Normal rate, regular rhythm and normal heart sounds.  No murmur heard. No BLE edema. Pulmonary/Chest: Effort normal and breath sounds normal. No respiratory distress. Abdominal: Soft.  There is no tenderness. Psychiatric: Patient has a normal mood and affect. behavior is normal. Judgment and thought content normal. Muscular Skeletal: pain with internal and external rotation of right hip   PHQ2/9: Depression screen [facility] 2/9 09/03/2017 02/04/2017 05/14/2016 10/24/2015 10/24/2015  Decreased Interest 3 3 0 0 0  Down, Depressed, Hopeless 1 2 0 0 0  PHQ - 2 Score 4 5 0 0 0  Altered sleeping 3 3 - - -  Tired, decreased energy 3 3 - - -  Change in appetite 0 0 - - -  Feeling bad or failure about yourself  0 0 - - -  Trouble concentrating 2 1 - - -  Moving slowly or fidgety/restless 0 0 - - -  Suicidal thoughts 0 0 - - -  PHQ-9 Score 12 12 - - -  Difficult doing work/chores Very difficult Very difficult - - -    Fall Risk: Fall Risk  09/03/2017 02/04/2017 01/01/2017 05/14/2016 10/24/2015  Falls in the past year? No No No No No  Number falls in past yr: - - - - -  Injury with Fall? - - - - -  Comment - - - - -     Functional Status Survey: Is the patient deaf or have  difficulty hearing?: No Does the patient have difficulty seeing, even when wearing glasses/contacts?: No Does the patient have difficulty concentrating, remembering, or making decisions?: No Does the patient have difficulty walking or climbing stairs?: Yes Does the patient have difficulty dressing or bathing?: No Does the patient have difficulty doing errands alone such as visiting a doctor's office or shopping?: Yes   Assessment & Plan  1. Mood disorder ([place])  - Ambulatory referral to [facility]  2. Other insomnia  - amitriptyline (ELAVIL) 25 MG tablet; Take 1 tablet (25 mg total) by mouth at bedtime.  Dispense: 30 tablet; Refill: 5  3. Migraine without aura and without status migrainosus, not intractable  - amitriptyline (ELAVIL) 25 MG tablet; Take 1 tablet (25 mg total) by mouth at bedtime.  Dispense: 30 tablet; Refill: 5  4. Hot flashes  - desvenlafaxine (PRISTIQ) 50 MG 24 hr tablet; Take 1 tablet (50 mg total) by mouth daily.  Dispense: 30 tablet; Refill: 5  5. Financial difficulties  - Ambulatory referral to [facility]  6. Mild intermittent asthma in adult without complication   7. Anemia, unspecified type  Advised MVI with iron   8. Primary osteoarthritis of right hip  Seeing pain management at [facility] and is trying to go to [facility] because of cost and had right hip replacement done   9. Vitamin D deficiency  Resume Vitamin D otc   10. Other chronic pain

## 2017-12-31 ENCOUNTER — Encounter: Payer: Self-pay | Admitting: Family Medicine

## 2018-02-25 ENCOUNTER — Encounter: Payer: Self-pay | Admitting: Family Medicine

## 2018-02-25 ENCOUNTER — Ambulatory Visit (INDEPENDENT_AMBULATORY_CARE_PROVIDER_SITE_OTHER): Payer: No Typology Code available for payment source | Admitting: Family Medicine

## 2018-02-25 VITALS — BP 110/80 | HR 80 | Temp 98.4°F | Resp 16 | Ht 59.0 in | Wt 178.2 lb

## 2018-02-25 DIAGNOSIS — E559 Vitamin D deficiency, unspecified: Secondary | ICD-10-CM | POA: Diagnosis not present

## 2018-02-25 DIAGNOSIS — G43009 Migraine without aura, not intractable, without status migrainosus: Secondary | ICD-10-CM | POA: Diagnosis not present

## 2018-02-25 DIAGNOSIS — M1611 Unilateral primary osteoarthritis, right hip: Secondary | ICD-10-CM

## 2018-02-25 DIAGNOSIS — E669 Obesity, unspecified: Secondary | ICD-10-CM

## 2018-02-25 DIAGNOSIS — G8929 Other chronic pain: Secondary | ICD-10-CM

## 2018-02-25 DIAGNOSIS — M25551 Pain in right hip: Secondary | ICD-10-CM

## 2018-02-25 DIAGNOSIS — E8881 Metabolic syndrome: Secondary | ICD-10-CM

## 2018-02-25 DIAGNOSIS — J4521 Mild intermittent asthma with (acute) exacerbation: Secondary | ICD-10-CM

## 2018-02-25 DIAGNOSIS — F39 Unspecified mood [affective] disorder: Secondary | ICD-10-CM

## 2018-02-25 DIAGNOSIS — J069 Acute upper respiratory infection, unspecified: Secondary | ICD-10-CM

## 2018-02-25 DIAGNOSIS — E785 Hyperlipidemia, unspecified: Secondary | ICD-10-CM

## 2018-02-25 DIAGNOSIS — R232 Flushing: Secondary | ICD-10-CM

## 2018-02-25 MED ORDER — CIMETIDINE 300 MG PO TABS: 300.0000 mg | ORAL_TABLET | Freq: Two times a day (BID) | ORAL | 0 refills | Status: DC

## 2018-02-25 MED ORDER — DESVENLAFAXINE SUCCINATE ER 50 MG PO TB24: 50.0000 mg | ORAL_TABLET | Freq: Every day | ORAL | 5 refills | Status: DC

## 2018-02-25 MED ORDER — BUDESONIDE 0.5 MG/2ML IN SUSP: 0.5000 mg | Freq: Two times a day (BID) | RESPIRATORY_TRACT | 0 refills | Status: DC

## 2018-02-25 NOTE — Progress Notes (Signed)
Name: Shelby Henson   MRN: [id number]    DOB: Jul 26, 1970   Date:02/25/2018       Progress Note  Subjective  Chief Complaint  Chief Complaint  Patient presents with  . Medication Refill  . Labs Only    She states that she was taking Zantac and she wants to have labs done because of recall?  Marland Kitchen URI    symptoms is stuffy head and nose, itchy throat, x 4 days. Treating symptoms with DayQuil and Tylenol.    HPI  Migraine:she changed jobs and stress improved, migraines down to about 3 times a month.  She states pain is throbbing, frontal, usually 8/10. She states Excedrin helps but it aggravates her stomach. Taking Tylenol now.  Discussed rebound headaches because of oxycodone use for chronic right hip pain..Sometimes has photophobia, seldom has  phonophobia, dizzinessand intermittent nausea. She tried Topamax however caused severe mood swings and she stopped taking it within the first three days. She states episodes not as often now and states did not tolerate gabapentin in the past. She is doing well, continue prn medication   Asthma Mild Intermittent: she has an URI and has noticed some chest tightness, no SOB or wheezing, she does not want to change medication or take prednisone at this time, but she is willing to resume Pulmicort while sick  Mood swings: :she has been on Pritiq for many years, she has a family history of bipolar, we tried Depakote but she is not sure why she stopped. She is doing well with new job, likes working at [employer]   Chronic hip pain: going to [facility] pain clinic, but trying to go to [facility] because of indigent care program to have right hip replacement. Taking oxydone daily. Pain is constant at a 7/10 right now but she did not take medication   Obesity: she has lost weight, last visit weight was down to 171 lbs, she states still following a keto diet, but now works third shift, she is sleeping at most 4 hours per day, she states she avoids junk food. She cannot  tolerate medication for sleep, makes her more sleepy   Dyslipidemia: recheck next visit  Metabolic syndrome: improved with weight loss, we will recheck labs during her CPE, no polyphagia, polydipsia or polyuria.    Patient Active Problem List   Diagnosis Date Noted  . Vitamin D deficiency 02/25/2018  . Anemia, unspecified 01/08/2017  . Chronic pain 10/24/2015  . Hot flashes 10/24/2015  . Mood changes 10/24/2015  . Migraine without aura and without status migrainosus, not intractable 10/24/2015  . Metabolic syndrome 93/71/6967  . Dyslipidemia 10/24/2015  . Allergic rhinitis, seasonal 05/17/2015  . Asthma, mild intermittent 08/08/2014  . Chronic insomnia 08/08/2014  . DDD (degenerative disc disease), lumbar 08/08/2014  . Mood disorder ([place]) 08/08/2014  . Gastro-esophageal reflux disease without esophagitis 08/08/2014  . H/O total hysterectomy 08/08/2014  . Irritable bowel syndrome with diarrhea 08/08/2014  . Osteoarthritis of right hip 08/08/2014  . Adult BMI 30+ 08/08/2014  . SBO (spina bifida occulta) 08/08/2014    Past Surgical History:  Procedure Laterality Date  . ABDOMINAL HYSTERECTOMY    . ABLATION    . CESAREAN SECTION    . CESAREAN SECTION    . LEEP    . TOTAL VAGINAL HYSTERECTOMY      Family History  Problem Relation Age of Onset  . Bipolar disorder Brother   . Depression Mother     Social History   Socioeconomic  History  . Marital status: Divorced    Spouse name: Not on file  . Number of children: 3  . Years of education: Not on file  . Highest education level: Not on file  Occupational History  . Not on file  Social Needs  . Financial resource strain: Very hard  . Food insecurity:    Worry: Never true    Inability: Never true  . Transportation needs:    Medical: No    Non-medical: No  Tobacco Use  . Smoking status: Never Smoker  . Smokeless tobacco: Never Used  Substance and Sexual Activity  . Alcohol use: Yes    Alcohol/week: 0.0  standard drinks    Comment: occasionally  . Drug use: No  . Sexual activity: Not Currently    Comment: boyfriend unable  Lifestyle  . Physical activity:    Days per week: 0 days    Minutes per session: 0 min  . Stress: Very much  Relationships  . Social connections:    Talks on phone: More than three times a week    Gets together: More than three times a week    Attends religious service: Never    Active member of club or organization: No    Attends meetings of clubs or organizations: Never    Relationship status: Divorced  . Intimate partner violence:    Fear of current or ex partner: No    Emotionally abused: No    Physically abused: No    Forced sexual activity: No  Other Topics Concern  . Not on file  Social History Narrative  . Not on file     Current Outpatient Medications:  .  albuterol (PROVENTIL) (2.5 MG/3ML) 0.083% nebulizer solution, Take 3 mLs (2.5 mg total) by nebulization every 6 (six) hours as needed for wheezing or shortness of breath., Disp: 75 mL, Rfl: 2 .  desvenlafaxine (PRISTIQ) 50 MG 24 hr tablet, Take 1 tablet (50 mg total) by mouth daily., Disp: 30 tablet, Rfl: 5 .  Oxycodone HCl 10 MG TABS, TAKE 1 TABLET BY MOUTH TWICE A DAY**FILL 07/31, Disp: , Rfl: 0 .  cimetidine (TAGAMET) 300 MG tablet, Take 1 tablet (300 mg total) by mouth 2 (two) times daily., Disp: 180 tablet, Rfl: 0  Allergies  Allergen Reactions  . Celecoxib Hives  . Prednisone     intolerance, makes her angry. Makes her angry  . Topamax [Topiramate]     Mood changes  . Zolpidem   . Codeine Rash  . Penicillins Rash    I personally reviewed active problem list, medication list, allergies, family history, social history with the patient/caregiver today.   ROS  Constitutional: Negative for fever , positive for weight change.  Respiratory: Negative for cough and shortness of breath.   Cardiovascular: Negative for chest pain or palpitations.  Gastrointestinal: Negative for  abdominal pain, no bowel changes.  Musculoskeletal: Negative for gait problem or joint swelling.  Skin: Negative for rash.  Neurological: Negative for dizziness or headache.  No other specific complaints in a complete review of systems (except as listed in HPI above).  Objective  Vitals:   02/25/18 1028  BP: 110/80  Pulse: 80  Resp: 16  Temp: 98.4 F (36.9 C)  TempSrc: Oral  SpO2: 97%  Weight: 178 lb 3.2 oz (80.8 kg)  Height: 4\' 11"  (1.499 m)    Body mass index is 35.99 kg/m.  Physical Exam  Constitutional: Patient appears well-developed and well-nourished. Obese  No distress.  HEENT: head atraumatic, normocephalic, pupils equal and reactive to light,  neck supple, throat within normal limits Cardiovascular: Normal rate, regular rhythm and normal heart sounds.  No murmur heard. No BLE edema. Pulmonary/Chest: Effort normal and breath sounds normal. No respiratory distress. Abdominal: Soft.  There is no tenderness. Psychiatric: Patient has a normal mood and affect. behavior is normal. Judgment and thought content normal.  PHQ2/9: Depression screen [facility] 2/9 02/25/2018 09/03/2017 02/04/2017 05/14/2016 10/24/2015  Decreased Interest 0 3 3 0 0  Down, Depressed, Hopeless 0 1 2 0 0  PHQ - 2 Score 0 4 5 0 0  Altered sleeping 3 3 3  - -  Tired, decreased energy 0 3 3 - -  Change in appetite 0 0 0 - -  Feeling bad or failure about yourself  0 0 0 - -  Trouble concentrating 3 2 1  - -  Moving slowly or fidgety/restless 0 0 0 - -  Suicidal thoughts 0 0 0 - -  PHQ-9 Score 6 12 12  - -  Difficult doing work/chores Somewhat difficult Very difficult Very difficult - -     Fall Risk: Fall Risk  02/25/2018 09/03/2017 02/04/2017 01/01/2017 05/14/2016  Falls in the past year? 0 No No No No  Number falls in past yr: 0 - - - -  Injury with Fall? 0 - - - -  Comment - - - - -     Assessment & Plan  1. Mood disorder (HCC)  - desvenlafaxine (PRISTIQ) 50 MG 24 hr tablet; Take 1 tablet (50 mg total)  by mouth daily.  Dispense: 30 tablet; Refill: 5  2. Mild intermittent asthma in adult with acute exacerbation   Resume pulmicort while sick   3. Migraine without aura and without status migrainosus, not intractable  Doing better now  4. Vitamin D deficiency  Take otc medication   5. Metabolic syndrome   6. Dyslipidemia  Recheck labs on her CPE   7. Viral URI   8. Obesity, Class II, BMI 35-39.9  Discussed with the patient the risk posed by an increased BMI. Discussed importance of portion control, calorie counting and at least 150 minutes of physical activity weekly. Avoid sweet beverages and drink more water. Eat at least 6 servings of fruit and vegetables daily   9. Hot flashes  - desvenlafaxine (PRISTIQ) 50 MG 24 hr tablet; Take 1 tablet (50 mg total) by mouth daily.  Dispense: 30 tablet; Refill: 5   10. Chronic right hip pain   11. Primary osteoarthritis of right hip  [facility]

## 2018-02-25 NOTE — Addendum Note (Signed)
Addended by: Chilton Greathouse on: 02/25/2018 11:26 AM   Modules accepted: Orders

## 2018-03-03 ENCOUNTER — Encounter: Payer: Self-pay | Admitting: Family Medicine

## 2018-03-03 ENCOUNTER — Other Ambulatory Visit: Payer: Self-pay | Admitting: Family Medicine

## 2018-03-03 MED ORDER — BENZONATATE 100 MG PO CAPS: 100.0000 mg | ORAL_CAPSULE | Freq: Two times a day (BID) | ORAL | 0 refills | Status: DC | PRN

## 2018-04-15 ENCOUNTER — Ambulatory Visit: Payer: Medicaid Other | Admitting: Obstetrics & Gynecology

## 2018-04-30 ENCOUNTER — Ambulatory Visit: Payer: No Typology Code available for payment source | Admitting: Obstetrics & Gynecology

## 2018-06-30 ENCOUNTER — Ambulatory Visit: Payer: No Typology Code available for payment source | Admitting: Obstetrics & Gynecology

## 2018-08-04 ENCOUNTER — Ambulatory Visit (INDEPENDENT_AMBULATORY_CARE_PROVIDER_SITE_OTHER): Payer: No Typology Code available for payment source | Admitting: Obstetrics & Gynecology

## 2018-08-04 ENCOUNTER — Other Ambulatory Visit: Payer: Self-pay

## 2018-08-04 ENCOUNTER — Other Ambulatory Visit (HOSPITAL_COMMUNITY)
Admission: RE | Admit: 2018-08-04 | Discharge: 2018-08-04 | Disposition: A | Discharge status: Home / Self Care | Payer: No Typology Code available for payment source | Source: Ambulatory Visit | Attending: Obstetrics & Gynecology | Admitting: Obstetrics & Gynecology

## 2018-08-04 ENCOUNTER — Encounter: Payer: Self-pay | Admitting: Obstetrics & Gynecology

## 2018-08-04 VITALS — BP 120/80 | Ht 59.0 in | Wt 183.0 lb

## 2018-08-04 DIAGNOSIS — Z01419 Encounter for gynecological examination (general) (routine) without abnormal findings: Secondary | ICD-10-CM

## 2018-08-04 DIAGNOSIS — Z124 Encounter for screening for malignant neoplasm of cervix: Secondary | ICD-10-CM

## 2018-08-04 DIAGNOSIS — Z0000 Encounter for general adult medical examination without abnormal findings: Secondary | ICD-10-CM

## 2018-08-04 DIAGNOSIS — Z1239 Encounter for other screening for malignant neoplasm of breast: Secondary | ICD-10-CM

## 2018-08-04 DIAGNOSIS — R5382 Chronic fatigue, unspecified: Secondary | ICD-10-CM

## 2018-08-04 NOTE — Patient Instructions (Signed)
PAP every three years Mammogram every year    Call [phone] to schedule at [facility] today

## 2018-08-04 NOTE — Progress Notes (Signed)
HPI:      [name] is a 48 y.o. G3P3003  No LMP recorded. Patient has had a hysterectomy [facility] 2010), she presents today for her annual examination. The patient has no complaints today other than fatigue, has h/o anemia.  Also mild GSI at times.  Occas hot flash.  The patient is sexually active. Her last pap: approximate date 2017 and was normal and last mammogram: approximate date 2018 and was normal. The patient does perform self breast exams.  There is no notable family history of breast or ovarian cancer in her family.  The patient has regular exercise: yes.  The patient denies current symptoms of depression.    GYN History: Contraception: status post hysterectomy  PMHx: Past Medical History:  Diagnosis Date  . Asthma   . Depression   . Migraine   . Osteoarthritis    Past Surgical History:  Procedure Laterality Date  . ABDOMINAL HYSTERECTOMY    . ABLATION    . CESAREAN SECTION    . CESAREAN SECTION    . LAPAROSCOPY  2018  . LEEP    . TOTAL VAGINAL HYSTERECTOMY     Family History  Problem Relation Age of Onset  . Bipolar disorder Brother   . Depression Mother    Social History   Tobacco Use  . Smoking status: Never Smoker  . Smokeless tobacco: Never Used  Substance Use Topics  . Alcohol use: Yes    Alcohol/week: 0.0 standard drinks    Comment: occasionally  . Drug use: No    Current Outpatient Medications:  .  albuterol (PROVENTIL) (2.5 MG/3ML) 0.083% nebulizer solution, Take 3 mLs (2.5 mg total) by nebulization every 6 (six) hours as needed for wheezing or shortness of breath., Disp: 75 mL, Rfl: 2 .  budesonide (PULMICORT) 0.5 MG/2ML nebulizer solution, Take 2 mLs (0.5 mg total) by nebulization 2 (two) times daily., Disp: 60 mL, Rfl: 0 .  desvenlafaxine (PRISTIQ) 50 MG 24 hr tablet, Take 1 tablet (50 mg total) by mouth daily., Disp: 30 tablet, Rfl: 5 .  Oxycodone HCl 10 MG TABS, TAKE 1 TABLET BY MOUTH TWICE A DAY**FILL 07/31, Disp: , Rfl: 0 .  benzonatate  (TESSALON) 100 MG capsule, Take 1-2 capsules (100-200 mg total) by mouth 2 (two) times daily as needed., Disp: 40 capsule, Rfl: 0 .  cimetidine (TAGAMET) 300 MG tablet, Take 1 tablet (300 mg total) by mouth 2 (two) times daily. (Patient not taking: Reported on 08/04/2018), Disp: 180 tablet, Rfl: 0 Allergies: Celecoxib, Prednisone, Topamax [topiramate], Zolpidem, Codeine, and Penicillins  Review of Systems  Constitutional: Positive for malaise/fatigue. Negative for chills and fever.  HENT: Negative for congestion, sinus pain and sore throat.   Eyes: Negative for blurred vision and pain.  Respiratory: Negative for cough and wheezing.   Cardiovascular: Negative for chest pain and leg swelling.  Gastrointestinal: Negative for abdominal pain, constipation, diarrhea, heartburn, nausea and vomiting.  Genitourinary: Negative for dysuria, frequency, hematuria and urgency.  Musculoskeletal: Negative for back pain, joint pain, myalgias and neck pain.  Skin: Negative for itching and rash.  Neurological: Negative for dizziness, tremors and weakness.  Endo/Heme/Allergies: Does not bruise/bleed easily.  Psychiatric/Behavioral: Negative for depression. The patient is not nervous/anxious and does not have insomnia.     Objective: BP 120/80   Ht 4\' 11"  (1.499 m)   Wt 183 lb (83 kg)   BMI 36.96 kg/m   Filed Weights   08/04/18 1512  Weight: 183 lb (83 kg)  Body mass index is 36.96 kg/m. Physical Exam Constitutional:      General: She is not in acute distress.    Appearance: She is well-developed.  Genitourinary:     Pelvic exam was performed with patient supine.     Vagina, cervix and rectum normal.     No lesions in the vagina.     No vaginal bleeding.     No cervical polyp.     Uterus is absent.     No right or left adnexal mass present.     Right adnexa not tender.     Left adnexa not tender.  HENT:     Head: Normocephalic and atraumatic. No laceration.     Right Ear: Hearing normal.      Left Ear: Hearing normal.     Mouth/Throat:     Pharynx: Uvula midline.  Eyes:     Pupils: Pupils are equal, round, and reactive to light.  Neck:     Musculoskeletal: Normal range of motion and neck supple.     Thyroid: No thyromegaly.  Cardiovascular:     Rate and Rhythm: Normal rate and regular rhythm.     Heart sounds: No murmur. No friction rub. No gallop.   Pulmonary:     Effort: Pulmonary effort is normal. No respiratory distress.     Breath sounds: Normal breath sounds. No wheezing.  Chest:     Breasts:        Right: No mass, skin change or tenderness.        Left: No mass, skin change or tenderness.  Abdominal:     General: Bowel sounds are normal. There is no distension.     Palpations: Abdomen is soft.     Tenderness: There is no abdominal tenderness. There is no rebound.  Musculoskeletal: Normal range of motion.  Neurological:     Mental Status: She is alert and oriented to person, place, and time.     Cranial Nerves: No cranial nerve deficit.  Skin:    General: Skin is warm and dry.  Psychiatric:        Judgment: Judgment normal.  Vitals signs reviewed.     Assessment:  ANNUAL EXAM 1. Annual physical exam   2. Screening for cervical cancer   3. Screening for breast cancer   4. Chronic fatigue      Screening Plan:            1.  Cervical Screening-  Pap smear done today  2. Breast screening- Exam annually and mammogram>40 planned   3. Colonoscopy every 10 years, Hemoccult testing - after age 27  4. Labs Ordered today   5. Chronic fatigue - CBC - TSH      F/U  Return in about 1 year (around 08/04/2019) for Annual.  Barnett Applebaum, MD, [facility], [facility] 08/04/2018  3:51 PM

## 2018-08-05 LAB — CBC
Hematocrit: 36.3 % (ref 34.0–46.6)
Hemoglobin: 12.2 g/dL (ref 11.1–15.9)
MCH: 27.5 pg (ref 26.6–33.0)
MCHC: 33.6 g/dL (ref 31.5–35.7)
MCV: 82 fL (ref 79–97)
Platelets: 208 10*3/uL (ref 150–450)
RBC: 4.43 x10E6/uL (ref 3.77–5.28)
RDW: 13.6 % (ref 11.7–15.4)
WBC: 6.7 10*3/uL (ref 3.4–10.8)

## 2018-08-05 LAB — TSH: TSH: 0.76 u[IU]/mL (ref 0.450–4.500)

## 2018-08-08 LAB — CYTOLOGY - PAP
Adequacy: ABSENT
Diagnosis: NEGATIVE
HPV: NOT DETECTED

## 2018-08-27 ENCOUNTER — Encounter: Payer: Self-pay | Admitting: Family Medicine

## 2018-08-28 ENCOUNTER — Other Ambulatory Visit: Payer: Self-pay

## 2018-08-28 ENCOUNTER — Ambulatory Visit
Admission: RE | Admit: 2018-08-28 | Discharge: 2018-08-28 | Disposition: A | Discharge status: Home / Self Care | Payer: No Typology Code available for payment source | Source: Ambulatory Visit | Attending: Obstetrics & Gynecology | Admitting: Obstetrics & Gynecology

## 2018-08-28 DIAGNOSIS — Z1239 Encounter for other screening for malignant neoplasm of breast: Secondary | ICD-10-CM

## 2018-08-28 DIAGNOSIS — Z1231 Encounter for screening mammogram for malignant neoplasm of breast: Secondary | ICD-10-CM | POA: Insufficient documentation

## 2018-09-02 ENCOUNTER — Encounter: Payer: Self-pay | Admitting: Family Medicine

## 2018-09-04 ENCOUNTER — Ambulatory Visit (INDEPENDENT_AMBULATORY_CARE_PROVIDER_SITE_OTHER): Payer: No Typology Code available for payment source | Admitting: Family Medicine

## 2018-09-04 ENCOUNTER — Other Ambulatory Visit: Payer: Self-pay

## 2018-09-04 ENCOUNTER — Encounter: Payer: Self-pay | Admitting: Family Medicine

## 2018-09-04 DIAGNOSIS — R232 Flushing: Secondary | ICD-10-CM

## 2018-09-04 DIAGNOSIS — E785 Hyperlipidemia, unspecified: Secondary | ICD-10-CM

## 2018-09-04 DIAGNOSIS — F5101 Primary insomnia: Secondary | ICD-10-CM

## 2018-09-04 DIAGNOSIS — J4521 Mild intermittent asthma with (acute) exacerbation: Secondary | ICD-10-CM

## 2018-09-04 DIAGNOSIS — J4520 Mild intermittent asthma, uncomplicated: Secondary | ICD-10-CM

## 2018-09-04 DIAGNOSIS — F39 Unspecified mood [affective] disorder: Secondary | ICD-10-CM

## 2018-09-04 DIAGNOSIS — E559 Vitamin D deficiency, unspecified: Secondary | ICD-10-CM

## 2018-09-04 DIAGNOSIS — E8881 Metabolic syndrome: Secondary | ICD-10-CM | POA: Diagnosis not present

## 2018-09-04 DIAGNOSIS — F419 Anxiety disorder, unspecified: Secondary | ICD-10-CM

## 2018-09-04 DIAGNOSIS — Z113 Encounter for screening for infections with a predominantly sexual mode of transmission: Secondary | ICD-10-CM

## 2018-09-04 MED ORDER — DESVENLAFAXINE SUCCINATE ER 50 MG PO TB24: 50.0000 mg | ORAL_TABLET | Freq: Every day | ORAL | 5 refills | Status: DC

## 2018-09-04 MED ORDER — BUDESONIDE 0.5 MG/2ML IN SUSP: 0.5000 mg | Freq: Two times a day (BID) | RESPIRATORY_TRACT | 5 refills | Status: DC

## 2018-09-04 MED ORDER — ALBUTEROL SULFATE (2.5 MG/3ML) 0.083% IN NEBU: 2.5000 mg | INHALATION_SOLUTION | Freq: Four times a day (QID) | RESPIRATORY_TRACT | 2 refills | Status: DC | PRN

## 2018-09-04 MED ORDER — QUETIAPINE FUMARATE 25 MG PO TABS: 25.0000 mg | ORAL_TABLET | Freq: Every day | ORAL | 0 refills | Status: DC

## 2018-09-04 MED ORDER — HYDROXYZINE HCL 10 MG PO TABS: 10.0000 mg | ORAL_TABLET | Freq: Three times a day (TID) | ORAL | 0 refills | Status: DC | PRN

## 2018-09-04 NOTE — Progress Notes (Signed)
Name: Shelby Henson   MRN: [id number]    DOB: April 13, 1970   Date:09/04/2018       Progress Note  Subjective  Chief Complaint  Chief Complaint  Patient presents with  . Medication Refill  . Mood disorder  . Migraine    Improving some-having them once a week but not as bad as they used to be  . Asthma    Has been bad-went and worked at [employer] and had to wear PPE for 12 hours and trouble with her asthma afterwards but needs a waiver to not go back there  . Chronic hip pain    Horrible and having to schedule hip replacement surgery and radiating to her left hip   . Obesity  . Dyslipidemia    I connected with  Violeta Gelinas  on 09/04/18 at 10:40 AM EDT by a video enabled telemedicine application and verified that I am speaking with the correct person using two identifiers.  I discussed the limitations of evaluation and management by telemedicine and the availability of in person appointments. The patient expressed understanding and agreed to proceed. Staff also discussed with the patient that there may be a patient responsible charge related to this service. 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 and had to wear a N95 for 12 hours and had chest  tightness, difficulty breathing, she had to do a treatment when she got back home, it happened this past weekend and is still having difficulty breathing and has been using pulmicort once a day since.    Mood swings :she has been on Pritiqfor many years, she has afamily history of bipolar,we tried Depakote but she is not sure why she stopped. She is doing well with new job, likes working at [employer]  Her boyfriend is 78 years old and just had a stroke , he is now at rehab facility Seh is worried  about him now, upset that she cannot see him Feeling more anxious lately we will add hydroxizine   Chronic hip pain: going to [facility] pain clinic, but trying to go to [facility] because of indigent care program to have right hip replacement. Taking oxydone daily. Pain is constant at a 6/10 right now, she will have hip surgery soon   Obesity: she is skipping meals, trying a low carb diet but does not think she is losing weight. Not checking it at home   Dyslipidemia: recheck labs prior to next visit   Metabolic syndrome: improved with weight loss, no polyphagia, polydipsia or polyuria. Eating a low carb diet   Patient Active Problem List   Diagnosis Date Noted  . Vitamin D deficiency 02/25/2018  . Anemia, unspecified 01/08/2017  . Chronic pain 10/24/2015  . Hot flashes 10/24/2015  . Mood changes 10/24/2015  . Migraine without aura and without status migrainosus, not intractable 10/24/2015  . Metabolic syndrome 78/29/5621  . Dyslipidemia 10/24/2015  . Allergic rhinitis, seasonal 05/17/2015  . Asthma, mild intermittent 08/08/2014  . Chronic insomnia 08/08/2014  . DDD (degenerative disc disease), lumbar 08/08/2014  . Mood disorder ([place]) 08/08/2014  . Gastro-esophageal reflux disease without esophagitis 08/08/2014  . H/O total hysterectomy 08/08/2014  . Irritable bowel syndrome with diarrhea 08/08/2014  . Osteoarthritis of  right hip 08/08/2014  . Adult BMI 30+ 08/08/2014  . SBO (spina bifida occulta) 08/08/2014    Past Surgical History:  Procedure Laterality Date  . ABDOMINAL HYSTERECTOMY    . ABLATION    . BREAST CYST ASPIRATION Left   . CESAREAN SECTION    . CESAREAN SECTION    . LAPAROSCOPY  2018  . LEEP    . TOTAL VAGINAL HYSTERECTOMY      Family History  Problem Relation Age of Onset  . Bipolar disorder Brother   . Depression Mother   . Breast cancer Neg Hx     Social History   Socioeconomic History  . Marital status: Divorced    Spouse name: Not on file  .  Number of children: 3  . Years of education: Not on file  . Highest education level: Not on file  Occupational History  . Not on file  Social Needs  . Financial resource strain: Very hard  . Food insecurity    Worry: Never true    Inability: Never true  . Transportation needs    Medical: No    Non-medical: No  Tobacco Use  . Smoking status: Never Smoker  . Smokeless tobacco: Never Used  Substance and Sexual Activity  . Alcohol use: Yes    Alcohol/week: 0.0 standard drinks    Comment: occasionally  . Drug use: No  . Sexual activity: Not Currently    Comment: boyfriend unable  Lifestyle  . Physical activity    Days per week: 0 days    Minutes per session: 0 min  . Stress: Very much  Relationships  . Social connections    Talks on phone: More than three times a week    Gets together: More than three times a week    Attends religious service: Never    Active member of club or organization: No    Attends meetings of clubs or organizations: Never    Relationship status: Divorced  . Intimate partner violence    Fear of current or ex partner: No    Emotionally abused: No    Physically abused: No    Forced sexual activity: No  Other Topics Concern  . Not on file  Social History Narrative  . Not on file     Current Outpatient Medications:  .  albuterol (PROVENTIL) (2.5 MG/3ML) 0.083% nebulizer solution, Take 3 mLs (2.5 mg total) by nebulization every 6 (six) hours as needed for wheezing or shortness of breath., Disp: 75 mL, Rfl: 2 .  budesonide (PULMICORT) 0.5 MG/2ML nebulizer solution, Take 2 mLs (0.5 mg total) by nebulization 2 (two) times daily., Disp: 60 mL, Rfl: 0 .  desvenlafaxine (PRISTIQ) 50 MG 24 hr tablet, Take 1 tablet (50 mg total) by mouth daily., Disp: 30 tablet, Rfl: 5 .  Oxycodone HCl 10 MG TABS, TAKE 1 TABLET BY MOUTH TWICE A DAY**FILL 07/31, Disp: , Rfl: 0 .  cimetidine (TAGAMET) 300 MG tablet, Take 1 tablet (300 mg total) by mouth 2 (two) times daily.  (Patient not taking: Reported on 08/04/2018), Disp: 180 tablet, Rfl: 0  Allergies  Allergen Reactions  . Celecoxib Hives  . Prednisone     intolerance, makes her angry. Makes her angry  . Topamax [Topiramate]     Mood changes  . Zolpidem   . Codeine Rash  . Penicillins Rash    I personally reviewed active problem list, medication list, allergies, family history, social history with the patient/caregiver today.   ROS  Ten systems reviewed and is negative except as mentioned in HPI   Objective  Virtual encounter, vitals not obtained.  There is no height or weight on file to calculate BMI.  Physical Exam  Awake, alert and oriented  PHQ2/9: Depression screen [facility] 2/9 09/04/2018 02/25/2018 09/03/2017 02/04/2017 05/14/2016  Decreased Interest 1 0 3 3 0  Down, Depressed, Hopeless 1 0 1 2 0  PHQ - 2 Score 2 0 4 5 0  Altered sleeping 3 3 3 3  -  Tired, decreased energy 3 0 3 3 -  Change in appetite 1 0 0 0 -  Feeling bad or failure about yourself  0 0 0 0 -  Trouble concentrating 0 3 2 1  -  Moving slowly or fidgety/restless 1 0 0 0 -  Suicidal thoughts 0 0 0 0 -  PHQ-9 Score 10 6 12 12  -  Difficult doing work/chores Somewhat difficult Somewhat difficult Very difficult Very difficult -   PHQ-2/9 Result is positive.    Fall Risk: Fall Risk  09/04/2018 02/25/2018 09/03/2017 02/04/2017 01/01/2017  Falls in the past year? 0 0 No No No  Number falls in past yr: 0 0 - - -  Injury with Fall? 0 0 - - -  Comment - - - - -     Assessment & Plan  1. Vitamin D deficiency  - VITAMIN D 25 Hydroxy (Vit-D Deficiency, Fractures); Future  2. Mild intermittent asthma with acute exacerbation  Continue pulmicort  3. Metabolic syndrome  - Hemoglobin A1c; Future  4. Dyslipidemia  - Lipid panel; Future  5. Mood disorder ([place])  - Comprehensive metabolic panel; Future - desvenlafaxine (PRISTIQ) 50 MG 24 hr tablet; Take 1 tablet (50 mg total) by mouth daily.  Dispense: 30 tablet; Refill: 5 -  QUEtiapine (SEROQUEL) 25 MG tablet; Take 1 tablet (25 mg total) by mouth at bedtime.  Dispense: 30 tablet; Refill: 0  6. Routine screening for STI (sexually transmitted infection)  - HIV Antibody (routine testing w rflx); Future - Hepatitis panel, acute; Future - RPR; Future  7. Mild intermittent asthma with acute exacerbation in adult  - budesonide (PULMICORT) 0.5 MG/2ML nebulizer solution; Take 2 mLs (0.5 mg total) by nebulization 2 (two) times daily.  Dispense: 60 mL; Refill: 5  8. Mild intermittent asthma in adult without complication  - albuterol (PROVENTIL) (2.5 MG/3ML) 0.083% nebulizer solution; Take 3 mLs (2.5 mg total) by nebulization every 6 (six) hours as needed for wheezing or shortness of breath.  Dispense: 75 mL; Refill: 2  9. Hot flashes  - desvenlafaxine (PRISTIQ) 50 MG 24 hr tablet; Take 1 tablet (50 mg total) by mouth daily.  Dispense: 30 tablet; Refill: 5  10. Mood disorder ([place])  - Comprehensive metabolic panel; Future - desvenlafaxine (PRISTIQ) 50 MG 24 hr tablet; Take 1 tablet (50 mg total) by mouth daily.  Dispense: 30 tablet; Refill: 5 - QUEtiapine (SEROQUEL) 25 MG tablet; Take 1 tablet (25 mg total) by mouth at bedtime.  Dispense: 30 tablet; Refill: 0  11. Primary insomnia  - QUEtiapine (SEROQUEL) 25 MG tablet; Take 1 tablet (25 mg total) by mouth at bedtime.  Dispense: 30 tablet; Refill: 0  12. Anxiety  - hydrOXYzine (ATARAX/VISTARIL) 10 MG tablet; Take 1 tablet (10 mg total) by mouth 3 (three) times daily as needed. For anxiety  Dispense: 30 tablet; Refill: 0  I discussed the assessment and treatment plan with the patient. The patient was provided an opportunity to ask questions and all were  answered. The patient agreed with the plan and demonstrated an understanding of the instructions.  The patient was advised to call back or seek an in-person evaluation if the symptoms worsen or if the condition fails to improve as anticipated.  I provided 25  minutes of non-face-to-face time during this encounter.

## 2018-09-17 ENCOUNTER — Other Ambulatory Visit: Payer: Self-pay

## 2018-09-17 ENCOUNTER — Other Ambulatory Visit
Admission: RE | Admit: 2018-09-17 | Discharge: 2018-09-17 | Disposition: A | Discharge status: Home / Self Care | Payer: No Typology Code available for payment source | Source: Ambulatory Visit | Attending: Family Medicine | Admitting: Family Medicine

## 2018-09-17 DIAGNOSIS — E559 Vitamin D deficiency, unspecified: Secondary | ICD-10-CM | POA: Insufficient documentation

## 2018-09-17 DIAGNOSIS — Z113 Encounter for screening for infections with a predominantly sexual mode of transmission: Secondary | ICD-10-CM | POA: Insufficient documentation

## 2018-09-17 DIAGNOSIS — F39 Unspecified mood [affective] disorder: Secondary | ICD-10-CM | POA: Insufficient documentation

## 2018-09-17 DIAGNOSIS — E8881 Metabolic syndrome: Secondary | ICD-10-CM | POA: Diagnosis present

## 2018-09-17 DIAGNOSIS — E785 Hyperlipidemia, unspecified: Secondary | ICD-10-CM | POA: Insufficient documentation

## 2018-09-17 LAB — HEMOGLOBIN A1C
Hgb A1c MFr Bld: 5.5 % (ref 4.8–5.6)
Mean Plasma Glucose: 111.15 mg/dL

## 2018-09-17 LAB — COMPREHENSIVE METABOLIC PANEL
ALT: 22 U/L (ref 0–44)
AST: 21 U/L (ref 15–41)
Albumin: 4.1 g/dL (ref 3.5–5.0)
Alkaline Phosphatase: 94 U/L (ref 38–126)
Anion gap: 10 (ref 5–15)
BUN: 26 mg/dL — ABNORMAL HIGH (ref 6–20)
CO2: 23 mmol/L (ref 22–32)
Calcium: 9.6 mg/dL (ref 8.9–10.3)
Chloride: 104 mmol/L (ref 98–111)
Creatinine, Ser: 0.52 mg/dL (ref 0.44–1.00)
GFR calc Af Amer: >60 mL/min (ref >60)
GFR calc non Af Amer: >60 mL/min (ref >60)
Glucose, Bld: 105 mg/dL — ABNORMAL HIGH (ref 70–99)
Potassium: 3.4 mmol/L — ABNORMAL LOW (ref 3.5–5.1)
Sodium: 137 mmol/L (ref 135–145)
Total Bilirubin: 0.4 mg/dL (ref 0.3–1.2)
Total Protein: 8 g/dL (ref 6.5–8.1)

## 2018-09-17 LAB — LIPID PANEL
Cholesterol: 212 mg/dL — ABNORMAL HIGH (ref 0–200)
HDL: 47 mg/dL (ref >40)
LDL Cholesterol: 134 mg/dL — ABNORMAL HIGH (ref 0–99)
Total CHOL/HDL Ratio: 4.5 RATIO
Triglycerides: 156 mg/dL — ABNORMAL HIGH (ref <150)
VLDL: 31 mg/dL (ref 0–40)

## 2018-09-18 LAB — HIV ANTIBODY (ROUTINE TESTING W REFLEX): HIV Screen 4th Generation wRfx: NONREACTIVE

## 2018-09-18 LAB — VITAMIN D 25 HYDROXY (VIT D DEFICIENCY, FRACTURES): Vit D, 25-Hydroxy: 23.2 ng/mL — ABNORMAL LOW (ref 30.0–100.0)

## 2018-09-18 LAB — HEPATITIS PANEL, ACUTE
HCV Ab: 0.1 s/co ratio (ref 0.0–0.9)
Hep A IgM: NEGATIVE
Hep B C IgM: NEGATIVE
Hepatitis B Surface Ag: NEGATIVE

## 2018-09-18 LAB — RPR: RPR Ser Ql: NONREACTIVE

## 2018-09-25 ENCOUNTER — Other Ambulatory Visit: Payer: Self-pay | Admitting: Orthopedic Surgery

## 2018-10-30 HISTORY — PX: HIP SURGERY: SHX245

## 2018-11-05 ENCOUNTER — Other Ambulatory Visit: Payer: Self-pay

## 2018-11-05 ENCOUNTER — Encounter
Admission: RE | Admit: 2018-11-05 | Discharge: 2018-11-05 | Disposition: A | Discharge status: Home / Self Care | Payer: No Typology Code available for payment source | Source: Ambulatory Visit | Attending: Orthopedic Surgery | Admitting: Orthopedic Surgery

## 2018-11-05 DIAGNOSIS — Z01812 Encounter for preprocedural laboratory examination: Secondary | ICD-10-CM | POA: Diagnosis not present

## 2018-11-05 HISTORY — DX: Anemia, unspecified: D64.9

## 2018-11-05 HISTORY — DX: Other specified postprocedural states: Z98.890

## 2018-11-05 HISTORY — DX: Nausea with vomiting, unspecified: R11.2

## 2018-11-05 HISTORY — DX: Other complications of anesthesia, initial encounter: T88.59XA

## 2018-11-05 HISTORY — DX: Gastro-esophageal reflux disease without esophagitis: K21.9

## 2018-11-05 LAB — PROTIME-INR
INR: 0.9 (ref 0.8–1.2)
Prothrombin Time: 12 seconds (ref 11.4–15.2)

## 2018-11-05 LAB — BASIC METABOLIC PANEL
Anion gap: 11 (ref 5–15)
BUN: 19 mg/dL (ref 6–20)
CO2: 25 mmol/L (ref 22–32)
Calcium: 9.2 mg/dL (ref 8.9–10.3)
Chloride: 104 mmol/L (ref 98–111)
Creatinine, Ser: 0.54 mg/dL (ref 0.44–1.00)
GFR calc Af Amer: >60 mL/min (ref >60)
GFR calc non Af Amer: >60 mL/min (ref >60)
Glucose, Bld: 97 mg/dL (ref 70–99)
Potassium: 3.6 mmol/L (ref 3.5–5.1)
Sodium: 140 mmol/L (ref 135–145)

## 2018-11-05 LAB — URINALYSIS, ROUTINE W REFLEX MICROSCOPIC
Bilirubin Urine: NEGATIVE
Glucose, UA: NEGATIVE mg/dL
Hgb urine dipstick: NEGATIVE
Ketones, ur: NEGATIVE mg/dL
Leukocytes,Ua: NEGATIVE
Nitrite: NEGATIVE
Protein, ur: NEGATIVE mg/dL
Specific Gravity, Urine: 1.019 (ref 1.005–1.030)
pH: 5 (ref 5.0–8.0)

## 2018-11-05 LAB — CBC
HCT: 39.5 % (ref 36.0–46.0)
Hemoglobin: 12.6 g/dL (ref 12.0–15.0)
MCH: 27 pg (ref 26.0–34.0)
MCHC: 31.9 g/dL (ref 30.0–36.0)
MCV: 84.8 fL (ref 80.0–100.0)
Platelets: 260 10*3/uL (ref 150–400)
RBC: 4.66 MIL/uL (ref 3.87–5.11)
RDW: 14.1 % (ref 11.5–15.5)
WBC: 8.2 10*3/uL (ref 4.0–10.5)
nRBC: 0 % (ref 0.0–0.2)

## 2018-11-05 LAB — SURGICAL PCR SCREEN
MRSA, PCR: NEGATIVE
Staphylococcus aureus: NEGATIVE

## 2018-11-05 LAB — TYPE AND SCREEN
ABO/RH(D): O POS
Antibody Screen: NEGATIVE

## 2018-11-05 LAB — APTT: aPTT: 29 seconds (ref 24–36)

## 2018-11-05 NOTE — Patient Instructions (Signed)
Your procedure is scheduled on: 11-12-18 [facility] Report to Same Day Surgery 2nd floor medical mall [facility] Entrance-take elevator on left to 2nd floor.  Check in with surgery information desk.) To find out your arrival time please call [phone] between 1PM - 3PM on 11-11-18 TUESDAY  Remember: Instructions that are not followed completely may result in serious medical risk, up to and including death, or upon the discretion of your surgeon and anesthesiologist your surgery may need to be rescheduled.    _x___ 1. Do not eat food after midnight the night before your procedure. NO GUM OR CANDY AFTER MIDNIGHT. You may drink clear liquids up to 2 hours before you are scheduled to arrive at the hospital for your procedure.  Do not drink clear liquids within 2 hours of your scheduled arrival to the hospital.  Clear liquids include  --Water or Apple juice without pulp  --Gatorade  --Black Coffee or Clear Tea (No milk, no creamers, do not add anything to the coffee or Tea   ____Ensure clear carbohydrate drink on the way to the hospital for bariatric patients  _X___Ensure clear carbohydrate drink 3 hours before surgery.    __x__ 2. No Alcohol for 24 hours before or after surgery.   __x__3. No Smoking or e-cigarettes for 24 prior to surgery.  Do not use any chewable tobacco products for at least 6 hour prior to surgery   ____  4. Bring all medications with you on the day of surgery if instructed.    __x__ 5. Notify your doctor if there is any change in your medical condition     (cold, fever, infections).    x___6. On the morning of surgery brush your teeth with toothpaste and water.  You may rinse your mouth with mouth wash if you wish.  Do not swallow any toothpaste or mouthwash.   Do not wear jewelry, make-up, hairpins, clips or nail polish.  Do not wear lotions, powders, or perfumes.  Do not shave 48 hours prior to surgery. Men may shave face and neck.  Do not bring valuables  to the hospital.    [facility] is not responsible for any belongings or valuables.               Contacts, dentures or bridgework may not be worn into surgery.  Leave your suitcase in the car. After surgery it may be brought to your room.  For patients admitted to the hospital, discharge time is determined by your treatment team.  _  Patients discharged the day of surgery will not be allowed to drive home.  You will need someone to drive you home and stay with you the night of your procedure.    Please read over the following fact sheets that you were given:   [facility] Preparing for Surgery and or MRSA Information   _x___ TAKE THE FOLLOWING MEDICATION THE MORNING OF SURGERY WITH A SMALL SIP OF WATER.  These include:  1. PRISTIQ  2.  3.  4.  5.  6.  ____Fleets enema or Magnesium Citrate as directed.   _x___ Use CHG Soap or sage wipes as directed on instruction sheet   _X___ Use inhalers on the day of surgery and bring to hospital day of surgery-USE YOUR PULMICORT NEBULIZER THE MORNING OF SURGERY  ____ Stop Metformin and Janumet 2 days prior to surgery.    ____ Take 1/2 of usual insulin dose the night before surgery and none on the morning surgery.  ____ Follow recommendations from Cardiologist, Pulmonologist or PCP regarding stopping Aspirin, Coumadin, Plavix ,Eliquis, Effient, or Pradaxa, and Pletal.  X____Stop Anti-inflammatories such as Advil, Aleve, Ibuprofen, Motrin, Naproxen, Naprosyn, Goodies powders or aspirin products NOW-OK to take Tylenol    _x___ Stop supplements until after surgery-STOP APPLE CIDER VINEGAR NOW-MAY RESUME AFTER SURGERY   ____ Bring C-Pap to the hospital.

## 2018-11-07 ENCOUNTER — Other Ambulatory Visit
Admission: RE | Admit: 2018-11-07 | Discharge: 2018-11-07 | Disposition: A | Discharge status: Home / Self Care | Payer: No Typology Code available for payment source | Source: Ambulatory Visit | Attending: Orthopedic Surgery | Admitting: Orthopedic Surgery

## 2018-11-07 ENCOUNTER — Other Ambulatory Visit: Payer: Self-pay

## 2018-11-07 DIAGNOSIS — Z20828 Contact with and (suspected) exposure to other viral communicable diseases: Secondary | ICD-10-CM | POA: Insufficient documentation

## 2018-11-07 DIAGNOSIS — Z01812 Encounter for preprocedural laboratory examination: Secondary | ICD-10-CM | POA: Insufficient documentation

## 2018-11-07 LAB — SARS CORONAVIRUS 2 (TAT 6-24 HRS): SARS Coronavirus 2: NEGATIVE

## 2018-11-10 ENCOUNTER — Other Ambulatory Visit: Payer: No Typology Code available for payment source

## 2018-11-11 MED ORDER — TRANEXAMIC ACID-NACL 1000-0.7 MG/100ML-% IV SOLN
1000.0000 mg | INTRAVENOUS | Status: AC
  Administered 2018-11-12: 1000 mg via INTRAVENOUS
  Filled 2018-11-11: qty 100

## 2018-11-11 MED ORDER — CLINDAMYCIN PHOSPHATE 900 MG/50ML IV SOLN
900.0000 mg | INTRAVENOUS | Status: AC
  Administered 2018-11-12: 900 mg via INTRAVENOUS

## 2018-11-12 ENCOUNTER — Encounter: Payer: Self-pay | Admitting: *Deleted

## 2018-11-12 ENCOUNTER — Inpatient Hospital Stay: Payer: No Typology Code available for payment source | Admitting: Certified Registered Nurse Anesthetist

## 2018-11-12 ENCOUNTER — Inpatient Hospital Stay
Admission: RE | Admit: 2018-11-12 | Discharge: 2018-11-14 | DRG: 470 | Disposition: A | Discharge status: Home / Self Care | Payer: No Typology Code available for payment source | Attending: Orthopedic Surgery | Admitting: Orthopedic Surgery

## 2018-11-12 ENCOUNTER — Inpatient Hospital Stay: Payer: No Typology Code available for payment source

## 2018-11-12 ENCOUNTER — Other Ambulatory Visit: Payer: Self-pay

## 2018-11-12 ENCOUNTER — Encounter
Admission: RE | Disposition: A | Discharge status: Home / Self Care | Payer: Self-pay | Source: Home / Self Care | Attending: Orthopedic Surgery

## 2018-11-12 DIAGNOSIS — Z20828 Contact with and (suspected) exposure to other viral communicable diseases: Secondary | ICD-10-CM | POA: Diagnosis present

## 2018-11-12 DIAGNOSIS — M879 Osteonecrosis, unspecified: Secondary | ICD-10-CM | POA: Diagnosis present

## 2018-11-12 DIAGNOSIS — J45909 Unspecified asthma, uncomplicated: Secondary | ICD-10-CM | POA: Diagnosis present

## 2018-11-12 DIAGNOSIS — Z7982 Long term (current) use of aspirin: Secondary | ICD-10-CM | POA: Diagnosis not present

## 2018-11-12 DIAGNOSIS — M1611 Unilateral primary osteoarthritis, right hip: Principal | ICD-10-CM | POA: Diagnosis present

## 2018-11-12 DIAGNOSIS — Z79899 Other long term (current) drug therapy: Secondary | ICD-10-CM

## 2018-11-12 DIAGNOSIS — Z419 Encounter for procedure for purposes other than remedying health state, unspecified: Secondary | ICD-10-CM

## 2018-11-12 HISTORY — PX: TOTAL HIP ARTHROPLASTY: SHX124

## 2018-11-12 LAB — ABO/RH: ABO/RH(D): O POS

## 2018-11-12 SURGERY — ARTHROPLASTY, HIP, TOTAL, ANTERIOR APPROACH
Anesthesia: Spinal | Site: Hip | Laterality: Right

## 2018-11-12 MED ORDER — METHOCARBAMOL 1000 MG/10ML IJ SOLN
500.0000 mg | Freq: Four times a day (QID) | INTRAVENOUS | Status: DC | PRN
  Filled 2018-11-12: qty 5

## 2018-11-12 MED ORDER — MIDAZOLAM HCL 5 MG/5ML IJ SOLN
INTRAMUSCULAR | Status: DC | PRN
  Administered 2018-11-12 (×2): 2 mg via INTRAVENOUS

## 2018-11-12 MED ORDER — OXYCODONE HCL 5 MG PO TABS
10.0000 mg | ORAL_TABLET | ORAL | Status: DC | PRN
  Administered 2018-11-12: 15 mg via ORAL
  Administered 2018-11-13 (×3): 10 mg via ORAL
  Administered 2018-11-13: 15 mg via ORAL
  Administered 2018-11-14: 10 mg via ORAL
  Filled 2018-11-12 (×2): qty 3
  Filled 2018-11-12: qty 2

## 2018-11-12 MED ORDER — BACITRACIN 50000 UNITS IM SOLR
INTRAMUSCULAR | Status: AC
  Filled 2018-11-12: qty 1

## 2018-11-12 MED ORDER — OXYCODONE HCL ER 10 MG PO T12A
10.0000 mg | EXTENDED_RELEASE_TABLET | Freq: Two times a day (BID) | ORAL | Status: DC
  Administered 2018-11-12 – 2018-11-14 (×4): 10 mg via ORAL
  Filled 2018-11-12 (×4): qty 1

## 2018-11-12 MED ORDER — CLINDAMYCIN PHOSPHATE 600 MG/50ML IV SOLN
600.0000 mg | Freq: Four times a day (QID) | INTRAVENOUS | Status: AC
  Administered 2018-11-12 (×2): 600 mg via INTRAVENOUS
  Filled 2018-11-12 (×2): qty 50

## 2018-11-12 MED ORDER — SUCCINYLCHOLINE CHLORIDE 20 MG/ML IJ SOLN
INTRAMUSCULAR | Status: AC
  Filled 2018-11-12: qty 1

## 2018-11-12 MED ORDER — SODIUM CHLORIDE 0.9 % IR SOLN
Status: DC | PRN
  Administered 2018-11-12: 08:00:00

## 2018-11-12 MED ORDER — MAGNESIUM HYDROXIDE 400 MG/5ML PO SUSP
30.0000 mL | Freq: Every day | ORAL | Status: DC | PRN
  Administered 2018-11-13: 30 mL via ORAL
  Filled 2018-11-12: qty 30

## 2018-11-12 MED ORDER — PHENYLEPHRINE HCL (PRESSORS) 10 MG/ML IV SOLN
INTRAVENOUS | Status: AC
  Filled 2018-11-12: qty 1

## 2018-11-12 MED ORDER — PROPOFOL 500 MG/50ML IV EMUL
INTRAVENOUS | Status: AC
  Filled 2018-11-12: qty 50

## 2018-11-12 MED ORDER — RIVAROXABAN 10 MG PO TABS
10.0000 mg | ORAL_TABLET | Freq: Every day | ORAL | Status: DC
  Administered 2018-11-13 – 2018-11-14 (×2): 10 mg via ORAL
  Filled 2018-11-12 (×2): qty 1

## 2018-11-12 MED ORDER — ALUM & MAG HYDROXIDE-SIMETH 200-200-20 MG/5ML PO SUSP: 30.0000 mL | ORAL | Status: DC | PRN

## 2018-11-12 MED ORDER — CHLORHEXIDINE GLUCONATE 4 % EX LIQD
60.0000 mL | Freq: Once | CUTANEOUS | Status: AC
  Administered 2018-11-12: 4 via TOPICAL

## 2018-11-12 MED ORDER — MAGNESIUM CITRATE PO SOLN
1.0000 | Freq: Once | ORAL | Status: DC | PRN
  Filled 2018-11-12: qty 296

## 2018-11-12 MED ORDER — ACETAMINOPHEN 500 MG PO TABS
1000.0000 mg | ORAL_TABLET | Freq: Four times a day (QID) | ORAL | Status: AC
  Administered 2018-11-12 – 2018-11-13 (×4): 1000 mg via ORAL
  Filled 2018-11-12 (×4): qty 2

## 2018-11-12 MED ORDER — OXYCODONE HCL 5 MG PO TABS
5.0000 mg | ORAL_TABLET | ORAL | Status: DC | PRN
  Administered 2018-11-12: 5 mg via ORAL
  Administered 2018-11-14 (×2): 10 mg via ORAL
  Filled 2018-11-12 (×7): qty 2

## 2018-11-12 MED ORDER — TRAMADOL HCL 50 MG PO TABS
50.0000 mg | ORAL_TABLET | Freq: Four times a day (QID) | ORAL | Status: DC
  Administered 2018-11-12 – 2018-11-14 (×9): 50 mg via ORAL
  Filled 2018-11-12 (×9): qty 1

## 2018-11-12 MED ORDER — VENLAFAXINE HCL ER 75 MG PO CP24
75.0000 mg | ORAL_CAPSULE | Freq: Every day | ORAL | Status: DC
  Administered 2018-11-12 – 2018-11-14 (×3): 75 mg via ORAL
  Filled 2018-11-12 (×3): qty 1

## 2018-11-12 MED ORDER — ONDANSETRON HCL 4 MG/2ML IJ SOLN: 4.0000 mg | Freq: Four times a day (QID) | INTRAMUSCULAR | Status: DC | PRN

## 2018-11-12 MED ORDER — GLYCOPYRROLATE 0.2 MG/ML IJ SOLN
INTRAMUSCULAR | Status: AC
  Filled 2018-11-12: qty 1

## 2018-11-12 MED ORDER — EPINEPHRINE PF 1 MG/ML IJ SOLN
INTRAMUSCULAR | Status: AC
  Filled 2018-11-12: qty 1

## 2018-11-12 MED ORDER — METOCLOPRAMIDE HCL 10 MG PO TABS: 5.0000 mg | ORAL_TABLET | Freq: Three times a day (TID) | ORAL | Status: DC | PRN

## 2018-11-12 MED ORDER — BUPIVACAINE-EPINEPHRINE (PF) 0.25% -1:200000 IJ SOLN
INTRAMUSCULAR | Status: DC | PRN
  Administered 2018-11-12: 30 mL via PERINEURAL

## 2018-11-12 MED ORDER — EPHEDRINE SULFATE 50 MG/ML IJ SOLN
INTRAMUSCULAR | Status: AC
  Filled 2018-11-12: qty 2

## 2018-11-12 MED ORDER — BUPIVACAINE HCL (PF) 0.5 % IJ SOLN
INTRAMUSCULAR | Status: DC | PRN
  Administered 2018-11-12: 2.5 mL

## 2018-11-12 MED ORDER — METHOCARBAMOL 500 MG PO TABS
500.0000 mg | ORAL_TABLET | Freq: Four times a day (QID) | ORAL | Status: DC | PRN
  Administered 2018-11-13: 500 mg via ORAL
  Filled 2018-11-12: qty 1

## 2018-11-12 MED ORDER — ALBUTEROL SULFATE (2.5 MG/3ML) 0.083% IN NEBU: 2.5000 mg | INHALATION_SOLUTION | Freq: Four times a day (QID) | RESPIRATORY_TRACT | Status: DC | PRN

## 2018-11-12 MED ORDER — ACETAMINOPHEN 325 MG PO TABS
325.0000 mg | ORAL_TABLET | Freq: Four times a day (QID) | ORAL | Status: DC | PRN
  Administered 2018-11-13 – 2018-11-14 (×3): 650 mg via ORAL
  Filled 2018-11-12 (×3): qty 2

## 2018-11-12 MED ORDER — PROPOFOL 500 MG/50ML IV EMUL
INTRAVENOUS | Status: DC | PRN
  Administered 2018-11-12: 50 ug/kg/min via INTRAVENOUS

## 2018-11-12 MED ORDER — QUETIAPINE FUMARATE 25 MG PO TABS
25.0000 mg | ORAL_TABLET | Freq: Every day | ORAL | Status: DC
  Administered 2018-11-12 – 2018-11-13 (×2): 25 mg via ORAL
  Filled 2018-11-12 (×2): qty 1

## 2018-11-12 MED ORDER — LACTATED RINGERS IV SOLN
INTRAVENOUS | Status: DC
  Administered 2018-11-12 (×2): via INTRAVENOUS

## 2018-11-12 MED ORDER — CLINDAMYCIN PHOSPHATE 900 MG/50ML IV SOLN
INTRAVENOUS | Status: AC
  Filled 2018-11-12: qty 50

## 2018-11-12 MED ORDER — FENTANYL CITRATE (PF) 100 MCG/2ML IJ SOLN
INTRAMUSCULAR | Status: DC | PRN
  Administered 2018-11-12 (×2): 50 ug via INTRAVENOUS

## 2018-11-12 MED ORDER — FENTANYL CITRATE (PF) 100 MCG/2ML IJ SOLN
INTRAMUSCULAR | Status: AC
  Filled 2018-11-12: qty 2

## 2018-11-12 MED ORDER — DOCUSATE SODIUM 100 MG PO CAPS
100.0000 mg | ORAL_CAPSULE | Freq: Two times a day (BID) | ORAL | Status: DC
  Administered 2018-11-12 – 2018-11-14 (×4): 100 mg via ORAL
  Filled 2018-11-12 (×4): qty 1

## 2018-11-12 MED ORDER — SODIUM CHLORIDE 0.9 % IV SOLN
INTRAVENOUS | Status: DC | PRN
  Administered 2018-11-12: 25 ug/min via INTRAVENOUS

## 2018-11-12 MED ORDER — FAMOTIDINE 20 MG PO TABS
20.0000 mg | ORAL_TABLET | Freq: Once | ORAL | Status: AC
  Administered 2018-11-12: 06:00:00 20 mg via ORAL

## 2018-11-12 MED ORDER — LACTATED RINGERS IV SOLN
INTRAVENOUS | Status: DC
  Administered 2018-11-12 (×2): via INTRAVENOUS

## 2018-11-12 MED ORDER — HYDROXYZINE HCL 10 MG PO TABS
10.0000 mg | ORAL_TABLET | Freq: Three times a day (TID) | ORAL | Status: DC | PRN
  Filled 2018-11-12: qty 1

## 2018-11-12 MED ORDER — FAMOTIDINE 20 MG PO TABS
ORAL_TABLET | ORAL | Status: AC
  Filled 2018-11-12: qty 1

## 2018-11-12 MED ORDER — ONDANSETRON HCL 4 MG/2ML IJ SOLN: 4.0000 mg | Freq: Once | INTRAMUSCULAR | Status: DC | PRN

## 2018-11-12 MED ORDER — BUPIVACAINE HCL (PF) 0.25 % IJ SOLN
INTRAMUSCULAR | Status: AC
  Filled 2018-11-12: qty 30

## 2018-11-12 MED ORDER — MIDAZOLAM HCL 2 MG/2ML IJ SOLN
INTRAMUSCULAR | Status: AC
  Filled 2018-11-12: qty 2

## 2018-11-12 MED ORDER — BUDESONIDE 0.5 MG/2ML IN SUSP: 0.5000 mg | Freq: Two times a day (BID) | RESPIRATORY_TRACT | Status: DC | PRN

## 2018-11-12 MED ORDER — BUPIVACAINE HCL (PF) 0.5 % IJ SOLN
INTRAMUSCULAR | Status: AC
  Filled 2018-11-12: qty 10

## 2018-11-12 MED ORDER — BUDESONIDE 0.5 MG/2ML IN SUSP: 0.5000 mg | Freq: Two times a day (BID) | RESPIRATORY_TRACT | Status: DC

## 2018-11-12 MED ORDER — HYDROMORPHONE HCL 1 MG/ML IJ SOLN
0.5000 mg | INTRAMUSCULAR | Status: DC | PRN
  Administered 2018-11-12: 1 mg via INTRAVENOUS
  Filled 2018-11-12: qty 1

## 2018-11-12 MED ORDER — METOCLOPRAMIDE HCL 5 MG/ML IJ SOLN: 5.0000 mg | Freq: Three times a day (TID) | INTRAMUSCULAR | Status: DC | PRN

## 2018-11-12 MED ORDER — POVIDONE-IODINE 10 % EX SWAB: 2.0000 "application " | Freq: Once | CUTANEOUS | Status: DC

## 2018-11-12 MED ORDER — PHENOL 1.4 % MT LIQD
1.0000 | OROMUCOSAL | Status: DC | PRN
  Filled 2018-11-12: qty 177

## 2018-11-12 MED ORDER — PROPOFOL 10 MG/ML IV BOLUS
INTRAVENOUS | Status: AC
  Filled 2018-11-12: qty 20

## 2018-11-12 MED ORDER — ONDANSETRON HCL 4 MG PO TABS: 4.0000 mg | ORAL_TABLET | Freq: Four times a day (QID) | ORAL | Status: DC | PRN

## 2018-11-12 MED ORDER — MENTHOL 3 MG MT LOZG
1.0000 | LOZENGE | OROMUCOSAL | Status: DC | PRN
  Filled 2018-11-12: qty 9

## 2018-11-12 MED ORDER — SODIUM CHLORIDE FLUSH 0.9 % IV SOLN
INTRAVENOUS | Status: AC
  Filled 2018-11-12: qty 10

## 2018-11-12 MED ORDER — BISACODYL 10 MG RE SUPP: 10.0000 mg | Freq: Every day | RECTAL | Status: DC | PRN

## 2018-11-12 MED ORDER — FENTANYL CITRATE (PF) 100 MCG/2ML IJ SOLN: 25.0000 ug | INTRAMUSCULAR | Status: DC | PRN

## 2018-11-12 MED ORDER — LACTATED RINGERS IV SOLN: INTRAVENOUS | Status: DC

## 2018-11-12 SURGICAL SUPPLY — 48 items
BLADE SAGITTAL WIDE XTHICK NO (BLADE) ×2 IMPLANT
BRUSH SCRUB EZ  4% CHG (MISCELLANEOUS) ×2
BRUSH SCRUB EZ 4% CHG (MISCELLANEOUS) ×2 IMPLANT
CHLORAPREP W/TINT 26 (MISCELLANEOUS) ×2 IMPLANT
COVER HOLE (Hips) ×2 IMPLANT
COVER WAND RF STERILE (DRAPES) ×2 IMPLANT
DRAPE 3/4 80X56 (DRAPES) ×2 IMPLANT
DRAPE C-ARM 42X72 X-RAY (DRAPES) ×2 IMPLANT
DRAPE STERI IOBAN 125X83 (DRAPES) IMPLANT
DRSG AQUACEL AG ADV 3.5X10 (GAUZE/BANDAGES/DRESSINGS) IMPLANT
DRSG AQUACEL AG ADV 3.5X14 (GAUZE/BANDAGES/DRESSINGS) IMPLANT
ELECT BLADE 6.5 EXT (BLADE) ×2 IMPLANT
ELECT REM PT RETURN 9FT ADLT (ELECTROSURGICAL) ×2
ELECTRODE REM PT RTRN 9FT ADLT (ELECTROSURGICAL) ×1 IMPLANT
GAUZE XEROFORM 1X8 LF (GAUZE/BANDAGES/DRESSINGS) IMPLANT
GLOVE INDICATOR 8.0 STRL GRN (GLOVE) ×2 IMPLANT
GLOVE SURG ORTHO 8.0 STRL STRW (GLOVE) ×4 IMPLANT
GOWN STRL REUS W/ TWL LRG LVL3 (GOWN DISPOSABLE) ×1 IMPLANT
GOWN STRL REUS W/ TWL XL LVL3 (GOWN DISPOSABLE) ×1 IMPLANT
GOWN STRL REUS W/TWL LRG LVL3 (GOWN DISPOSABLE) ×1
GOWN STRL REUS W/TWL XL LVL3 (GOWN DISPOSABLE) ×1
HEAD FEM 28X-3 TAPER (Head) ×2 IMPLANT
HOOD PEEL AWAY FLYTE STAYCOOL (MISCELLANEOUS) ×6 IMPLANT
IV NS 1000ML (IV SOLUTION) ×1
IV NS 1000ML BAXH (IV SOLUTION) ×1 IMPLANT
KIT PATIENT CARE HANA TABLE (KITS) ×2 IMPLANT
KIT TURNOVER CYSTO (KITS) ×2 IMPLANT
LINER ACT 28ID 46OD (Liner) ×2 IMPLANT
MAT ABSORB  FLUID 56X50 GRAY (MISCELLANEOUS) ×1
MAT ABSORB FLUID 56X50 GRAY (MISCELLANEOUS) ×1 IMPLANT
NDL SAFETY ECLIPSE 18X1.5 (NEEDLE) ×2 IMPLANT
NEEDLE HYPO 18GX1.5 SHARP (NEEDLE) ×2
NEEDLE HYPO 22GX1.5 SAFETY (NEEDLE) ×2 IMPLANT
NEEDLE SPNL 20GX3.5 QUINCKE YW (NEEDLE) ×2 IMPLANT
PACK HIP PROSTHESIS (MISCELLANEOUS) ×2 IMPLANT
PADDING CAST BLEND 4X4 NS (MISCELLANEOUS) ×4 IMPLANT
PILLOW ABDUCTION MEDIUM (MISCELLANEOUS) ×2 IMPLANT
PULSAVAC PLUS IRRIG FAN TIP (DISPOSABLE) ×2
SCREW CAN HEAD 6.5MM HIP (Screw) ×2 IMPLANT
SHELL USE WITH LINER OC 46 (Shell) ×2 IMPLANT
STAPLER SKIN PROX 35W (STAPLE) ×2 IMPLANT
STEM STD COLLAR SZ1 POLARSTEM (Stem) ×2 IMPLANT
SUT BONE WAX W31G (SUTURE) ×2 IMPLANT
SUT DVC 2 QUILL PDO  T11 36X36 (SUTURE) ×1
SUT DVC 2 QUILL PDO T11 36X36 (SUTURE) ×1 IMPLANT
SUT VIC AB 2-0 CT1 18 (SUTURE) ×2 IMPLANT
SYR 20ML LL LF (SYRINGE) ×2 IMPLANT
TIP FAN IRRIG PULSAVAC PLUS (DISPOSABLE) ×1 IMPLANT

## 2018-11-12 NOTE — Anesthesia Procedure Notes (Signed)
Performed by: Metta Koranda, CRNA Pre-anesthesia Checklist: Patient identified, Emergency Drugs available, Suction available, Patient being monitored and Timeout performed Patient Re-evaluated:Patient Re-evaluated prior to induction Oxygen Delivery Method: Simple face mask Induction Type: IV induction       

## 2018-11-12 NOTE — H&P (Signed)
The patient has been re-examined, and the chart reviewed, and there have been no interval changes to the documented history and physical.  Plan a right total hip today.  Anesthesia is not consulted regarding a peripheral nerve block for post-operative pain.  The risks, benefits, and alternatives have been discussed at length, and the patient is willing to proceed.    

## 2018-11-12 NOTE — Op Note (Signed)
11/12/2018  9:33 AM  PATIENT:  Shelby Henson   MRN: [id number]  PRE-OPERATIVE DIAGNOSIS:  Osteonecrosis right hip   POST-OPERATIVE DIAGNOSIS: Same  Procedure: Right Total Hip Replacement  Surgeon: Elyn Aquas. Harlow Mares, MD   Assist: Carlynn Spry, PA-C  Anesthesia: Spinal   EBL: 10 mL   Specimens: None   Drains: None   Components used: A size 1 Polarstem [employer]hew, R3 size 46 mm by 28 mm shell, and a 28 mm by -3 mm head    Description of the procedure in detail: After informed consent was obtained and the appropriate extremity marked in the pre-operative holding area, the patient was taken to the operating room and placed in the supine position on the fracture table. All pressure points were well padded and bilateral lower extremities were place in traction spars. The hip was prepped and draped in standard sterile fashion. A spinal anesthetic had been delivered by the anesthesia team. The skin and subcutaneous tissues were injected with a mixture of Marcaine with epinephrine for post-operative pain. A longitudinal incision approximately 10 cm in length was carried out from the anterior superior iliac spine to the greater trochanter. The tensor fascia was divided and blunt dissection was taken down to the level of the joint capsule. The lateral circumflex vessels were cauterized. Deep retractors were placed and a portion of the anterior capsule was excised. Using fluoroscopy the neck cut was planned and carried out with a sagittal saw. The head was passed from the field with use of a corkscrew and hip skid. Deep retractors were placed along the acetabulum and the degenerative labrum and large osteophytes were removed with a Rongeur. The cup was sequentially reamed to a size 46 mm. The wound was irrigated and using fluoroscopy the size 46 mm cup was impacted in to anatomic position. A single screw was placed followed by a threaded hole cover. The final liner was impacted in to position.  Attention was then turned to the proximal femur. The leg was placed in extension and external rotation. The canal was opened and sequentially broached to a size 1. The trial components were placed and the hip relocated. The components were found to be in good position using fluoroscopy. The hip was dislocated and the trial components removed. The final components were impacted in to position and the hip relocated. The final components were again check with fluoroscopy and found to be in good position. Hemostasis was achieved with electrocautery. The deep capsule was injected with Marcaine and epinephrine. The wound was irrigated with bacitracin laced normal saline and the tensor fascia closed with #2 Quill suture. The subcutaneous tissues were closed with 2-0 vicryl and staples for the skin. A sterile dressing was applied and an abduction pillow. Patient tolerated the procedure well and there were no apparent complication. Patient was taken to the recovery room in good condition.   Kurtis Bushman, MD

## 2018-11-12 NOTE — Anesthesia Post-op Follow-up Note (Signed)
Anesthesia QCDR form completed.        

## 2018-11-12 NOTE — Anesthesia Procedure Notes (Signed)
Spinal  Patient location during procedure: OR Start time: 11/12/2018 7:44 AM End time: 11/12/2018 7:49 AM Staffing Anesthesiologist: Molli Barrows, MD Resident/CRNA: Demetrius Charity, CRNA Performed: resident/CRNA  Preanesthetic Checklist Completed: patient identified, site marked, surgical consent, pre-op evaluation, timeout performed, IV checked, risks and benefits discussed and monitors and equipment checked Spinal Block Patient position: sitting Prep: ChloraPrep Patient monitoring: heart rate, continuous pulse ox, blood pressure and cardiac monitor Approach: midline Location: L3-4 Injection technique: single-shot Needle Needle type: Whitacre and Introducer  Needle gauge: 25 G Needle length: 9 cm Assessment Sensory level: T10 Additional Notes Negative paresthesia. Negative blood return. Positive free-flowing CSF. Expiration date of kit checked and confirmed. Patient tolerated procedure well, without complications.

## 2018-11-12 NOTE — Anesthesia Preprocedure Evaluation (Signed)
Anesthesia Evaluation  Patient identified by MRN, date of birth, ID band Patient awake    Reviewed: Allergy & Precautions, H&P , NPO status , Patient's Chart, lab work & pertinent test results, reviewed documented beta blocker date and time   History of Anesthesia Complications (+) PONV and history of anesthetic complications  Airway Mallampati: II   Neck ROM: full    Dental  (+) Poor Dentition   Pulmonary neg shortness of breath, asthma , Patient abstained from smoking.,    Pulmonary exam normal        Cardiovascular Exercise Tolerance: Good negative cardio ROS Normal cardiovascular exam Rhythm:regular Rate:Normal     Neuro/Psych  Headaches, PSYCHIATRIC DISORDERS Depression    GI/Hepatic Neg liver ROS, GERD  Medicated,  Endo/Other  negative endocrine ROS  Renal/GU negative Renal ROS  negative genitourinary   Musculoskeletal   Abdominal   Peds  Hematology  (+) Blood dyscrasia, anemia ,   Anesthesia Other Findings Past Medical History: No date: Anemia No date: Asthma     Comment:  well controlled No date: Complication of anesthesia No date: Depression No date: GERD (gastroesophageal reflux disease)     Comment:  occ No date: Migraine No date: Osteoarthritis No date: PONV (postoperative nausea and vomiting)     Comment:  with c-section x 1 Past Surgical History: No date: ABDOMINAL HYSTERECTOMY No date: ABLATION No date: BREAST CYST ASPIRATION; Left No date: CESAREAN SECTION No date: CESAREAN SECTION No date: CESAREAN SECTION 2018: LAPAROSCOPY No date: LEEP No date: TOTAL VAGINAL HYSTERECTOMY BMI    Body Mass Index: 36.51 kg/m     Reproductive/Obstetrics negative OB ROS                             Anesthesia Physical Anesthesia Plan  ASA: III  Anesthesia Plan: Spinal   Post-op Pain Management:    Induction:   PONV Risk Score and Plan: 4 or greater  Airway  Management Planned:   Additional Equipment:   Intra-op Plan:   Post-operative Plan:   Informed Consent: I have reviewed the patients History and Physical, chart, labs and discussed the procedure including the risks, benefits and alternatives for the proposed anesthesia with the patient or authorized representative who has indicated his/her understanding and acceptance.     Dental Advisory Given  Plan Discussed with: CRNA  Anesthesia Plan Comments:         Anesthesia Quick Evaluation

## 2018-11-12 NOTE — Evaluation (Signed)
Physical Therapy Evaluation Patient Details Name: Shelby Henson MRN: [id number] DOB: [birth year] Today's Date: 11/12/2018   History of Present Illness  Pt is a 48 y.o. female s/p R THR 11/12/18.  PMH includes anemia, asthma, migraine, and PONV.  Clinical Impression  Prior to hospital admission, pt was independent and working.  Pt plans to stay with her mom in 1st floor apt for 2 weeks upon hospital discharge.  Currently pt is min assist semi-supine to sit; CGA with transfers; and CGA ambulating short distances in room with RW.  Pt on bed pan upon PT arrival and reporting need to use toilet instead; upon walking into bathroom and sitting on BSC over toilet, pt reporting nausea and with emesis (nausea symptoms improved after emesis: nurse notified).  Pt would benefit from skilled PT to address noted impairments and functional limitations (see below for any additional details).  Upon hospital discharge, recommend pt discharge with OP PT.    Follow Up Recommendations Outpatient PT    Equipment Recommendations  Rolling walker with 5" wheels;3in1 (PT)(youth sized)    Recommendations for Other Services       Precautions / Restrictions Precautions Precautions: Fall;Anterior Hip Precaution Booklet Issued: Yes (comment) Restrictions Weight Bearing Restrictions: Yes RLE Weight Bearing: Weight bearing as tolerated      Mobility  Bed Mobility Overal bed mobility: Needs Assistance Bed Mobility: Supine to Sit     Supine to sit: Min assist;HOB elevated     General bed mobility comments: assist for R LE; mild increased effort to perform  Transfers Overall transfer level: Needs assistance Equipment used: Rolling walker (2 wheeled) Transfers: Sit to/from [facility] Sit to Stand: Min guard Stand pivot transfers: Min guard(to BSC over toilet)       General transfer comment: initial vc's for UE placement and walker use with transfers; fairly strong stand  noted  Ambulation/Gait Ambulation/Gait assistance: Min guard Gait Distance (Feet): (10 feet bed to [facility] over toilet; 25 feet bathroom to recliner) Assistive device: Rolling walker (2 wheeled)   Gait velocity: mildly decreased   General Gait Details: decreased stance time R LE; partial step through gait pattern; steady with RW use  Stairs            Wheelchair Mobility    Modified Rankin (Stroke Patients Only)       Balance Overall balance assessment: Needs assistance Sitting-balance support: No upper extremity supported;Feet supported Sitting balance-Leahy Scale: Normal Sitting balance - Comments: steady sitting reaching outside BOS   Standing balance support: No upper extremity supported Standing balance-Leahy Scale: Good Standing balance comment: steady standing reaching within BOS                             Pertinent Vitals/Pain Pain Assessment: 0-10 Pain Score: 6  Pain Location: R hip Pain Descriptors / Indicators: Burning;Sore;Tender Pain Intervention(s): Limited activity within patient's tolerance;Monitored during session;Premedicated before session;Repositioned;Ice applied  Vitals (HR and O2 on room air) stable and WFL throughout treatment session.    Home Living Family/patient expects to be discharged to:: Private residence Living Arrangements: Children(Lives together with son but plans to stay with her mother for 2 weeks) Available Help at Discharge: Family Type of Home: Apartment(staying with pt's mom) Home Access: Level entry     Home Layout: One level Home Equipment: None      Prior Function Level of Independence: Independent         Comments: Night float nurse  at [employer]        Extremity/Trunk Assessment   Upper Extremity Assessment Upper Extremity Assessment: Overall WFL for tasks assessed    Lower Extremity Assessment Lower Extremity Assessment: RLE deficits/detail;LLE deficits/detail RLE Deficits  / Details: good quad set; at least 3/5 AROM ankle DF/PF; hip flexion at least 3-/5 (limited d/t hip pain); knee flexion/extension at least 3/5 AROM LLE Deficits / Details: strength and ROM WFL    Cervical / Trunk Assessment Cervical / Trunk Assessment: Normal  Communication   Communication: No difficulties  Cognition Arousal/Alertness: Awake/alert Behavior During Therapy: WFL for tasks assessed/performed Overall Cognitive Status: Within Functional Limits for tasks assessed                                        General Comments General comments (skin integrity, edema, etc.): No drainage noted R hip dressing beginning/end of session.  Nursing cleared pt for participation in physical therapy.  Pt agreeable to PT session.  Pt's daughter present during session.    Exercises  Transfer and gait training   Assessment/Plan    PT Assessment Patient needs continued PT services  PT Problem List Decreased strength;Decreased range of motion;Decreased activity tolerance;Decreased balance;Decreased mobility;Decreased knowledge of use of DME;Decreased knowledge of precautions;Pain;Decreased skin integrity       PT Treatment Interventions DME instruction;Gait training;Stair training;Functional mobility training;Therapeutic activities;Therapeutic exercise;Balance training;Patient/family education    PT Goals (Current goals can be found in the Care Plan section)  Acute Rehab PT Goals Patient Stated Goal: to improve walking PT Goal Formulation: With patient Time For Goal Achievement: 11/26/18 Potential to Achieve Goals: Good    Frequency BID   Barriers to discharge        Co-evaluation               AM-PAC PT "6 Clicks" Mobility  Outcome Measure Help needed turning from your back to your side while in a flat bed without using bedrails?: A Little Help needed moving from lying on your back to sitting on the side of a flat bed without using bedrails?: A Little Help  needed moving to and from a bed to a chair (including a wheelchair)?: A Little Help needed standing up from a chair using your arms (e.g., wheelchair or bedside chair)?: A Little Help needed to walk in hospital room?: A Little Help needed climbing 3-5 steps with a railing? : A Little 6 Click Score: 18    End of Session Equipment Utilized During Treatment: Gait belt Activity Tolerance: Other (comment)(pt with emesis after walking to bathroom but then symptoms improved) Patient left: in chair;with call bell/phone within reach;with chair alarm set;with family/visitor present;with SCD's reapplied;Other (comment)(B heels floating; Ice packe to R hip) Nurse Communication: Mobility status;Precautions;Weight bearing status;Other (comment)(Pt's pain status) PT Visit Diagnosis: Other abnormalities of gait and mobility (R26.89);Muscle weakness (generalized) (M62.81);Difficulty in walking, not elsewhere classified (R26.2);Pain Pain - Right/Left: Right Pain - part of body: Hip    Time: 1455-1539 PT Time Calculation (min) (ACUTE ONLY): 44 min   Charges:   PT Evaluation $PT Eval Low Complexity: 1 Low PT Treatments $Therapeutic Activity: 23-37 mins       Leitha Bleak, PT 11/12/18, 3:57 PM [phone]

## 2018-11-12 NOTE — Anesthesia Postprocedure Evaluation (Signed)
Anesthesia Post Note  Patient: Shelby Henson  Procedure(s) Performed: TOTAL HIP ARTHROPLASTY ANTERIOR APPROACH (Right Hip)  Patient location during evaluation: PACU Anesthesia Type: Spinal Level of consciousness: oriented and awake and alert Pain management: pain level controlled Vital Signs Assessment: post-procedure vital signs reviewed and stable Respiratory status: spontaneous breathing, respiratory function stable and patient connected to nasal cannula oxygen Cardiovascular status: blood pressure returned to baseline and stable Postop Assessment: no headache, no backache and no apparent nausea or vomiting Anesthetic complications: no     Last Vitals:  Vitals:   11/12/18 1335 11/12/18 1434  BP: 129/78 123/78  Pulse: 72 77  Resp:    Temp: 36.7 C   SpO2: 100% 96%    Last Pain:  Vitals:   11/12/18 1400  TempSrc:   PainSc: 2                  Molli Barrows

## 2018-11-12 NOTE — Transfer of Care (Signed)
Immediate Anesthesia Transfer of Care Note  Patient: Shelby Henson  Procedure(s) Performed: TOTAL HIP ARTHROPLASTY ANTERIOR APPROACH (Right Hip)  Patient Location: PACU  Anesthesia Type:Spinal  Level of Consciousness: awake, alert  and oriented  Airway & Oxygen Therapy: Patient Spontanous Breathing and Patient connected to face mask oxygen  Post-op Assessment: Report given to RN and Post -op Vital signs reviewed and stable  Post vital signs: Reviewed and stable  Last Vitals:  Vitals Value Taken Time  BP 109/57 11/12/18 0951  Temp 98.1   Pulse 60 11/12/18 0953  Resp 17 11/12/18 0953  SpO2 99 % 11/12/18 0953  Vitals shown include unvalidated device data.  Last Pain:  Vitals:   11/12/18 0612  TempSrc: Tympanic  PainSc: 5          Complications: No apparent anesthesia complications

## 2018-11-13 ENCOUNTER — Encounter: Payer: Self-pay | Admitting: Orthopedic Surgery

## 2018-11-13 LAB — BASIC METABOLIC PANEL
Anion gap: 8 (ref 5–15)
BUN: 11 mg/dL (ref 6–20)
CO2: 26 mmol/L (ref 22–32)
Calcium: 8.9 mg/dL (ref 8.9–10.3)
Chloride: 104 mmol/L (ref 98–111)
Creatinine, Ser: 0.55 mg/dL (ref 0.44–1.00)
GFR calc Af Amer: >60 mL/min (ref >60)
GFR calc non Af Amer: >60 mL/min (ref >60)
Glucose, Bld: 126 mg/dL — ABNORMAL HIGH (ref 70–99)
Potassium: 3.8 mmol/L (ref 3.5–5.1)
Sodium: 138 mmol/L (ref 135–145)

## 2018-11-13 LAB — CBC
HCT: 32.1 % — ABNORMAL LOW (ref 36.0–46.0)
Hemoglobin: 10.2 g/dL — ABNORMAL LOW (ref 12.0–15.0)
MCH: 27.3 pg (ref 26.0–34.0)
MCHC: 31.8 g/dL (ref 30.0–36.0)
MCV: 86.1 fL (ref 80.0–100.0)
Platelets: 189 10*3/uL (ref 150–400)
RBC: 3.73 MIL/uL — ABNORMAL LOW (ref 3.87–5.11)
RDW: 14.1 % (ref 11.5–15.5)
WBC: 10.3 10*3/uL (ref 4.0–10.5)
nRBC: 0 % (ref 0.0–0.2)

## 2018-11-13 LAB — SURGICAL PATHOLOGY

## 2018-11-13 NOTE — TOC Initial Note (Signed)
Transition of Care [facility]) - Initial/Assessment Note    Patient Details  Name: Shelby Henson MRN: [id number] Date of Birth: 1970-07-06  [facility]) CM/SW Contact:    Su Hilt, RN Phone Number: 11/13/2018, 1:24 PM  Clinical Narrative:                 Met with the patient to discuss DC plan and needs She lives at home with her children and has a lot of family to help her.  She needs a RW, I notified Jeneen Rinks with [employer] She has an appointment with Outpatient PT for Monday and does not wish to have [facility] PT She has transportation with his children and her family She is up to date with her PCP  She can afford her medications  NO additional needs  Expected Discharge Plan: OP Rehab Barriers to Discharge: Continued Medical Work up   Patient Goals and CMS Choice Patient states their goals for this hospitalization and ongoing recovery are:: go home      Expected Discharge Plan and Services Expected Discharge Plan: OP Rehab   Discharge Planning Services: CM Consult   Living arrangements for the past 2 months: Single Family Home                 DME Arranged: Walker rolling DME Agency: [employer] Date DME Agency Contacted: 11/13/18 Time DME Agency Contacted: 74 Representative spoke with at DME Agency: [place] Arranged: NA          Prior Living Arrangements/Services Living arrangements for the past 2 months: [place] with:: Relatives, Adult Children, Minor Children Patient language and need for interpreter reviewed:: Yes Do you feel safe going back to the place where you live?: Yes      Need for Family Participation in Patient Care: No (Comment) Care giver support system in place?: Yes (comment)   Criminal Activity/Legal Involvement Pertinent to Current Situation/Hospitalization: No - Comment as needed  Activities of Daily Living Home Assistive Devices/Equipment: None ADL Screening (condition at time of admission) Patient's cognitive  ability adequate to safely complete daily activities?: Yes Is the patient deaf or have difficulty hearing?: No Does the patient have difficulty seeing, even when wearing glasses/contacts?: No Does the patient have difficulty concentrating, remembering, or making decisions?: No Patient able to express need for assistance with ADLs?: Yes Does the patient have difficulty dressing or bathing?: No Independently performs ADLs?: Yes (appropriate for developmental age) Does the patient have difficulty walking or climbing stairs?: No Weakness of Legs: None Weakness of Arms/Hands: None  Permission Sought/Granted   Permission granted to share information with : Yes, Verbal Permission Granted              Emotional Assessment Appearance:: Appears stated age Attitude/Demeanor/Rapport: Engaged Affect (typically observed): Appropriate Orientation: : Oriented to Self, Oriented to Place, Oriented to  Time, Oriented to Situation Alcohol / Substance Use: Not Applicable Psych Involvement: No (comment)  Admission diagnosis:  M16.11 unilateral primary osteoarthritis right hip Patient Active Problem List   Diagnosis Date Noted  . Osteonecrosis of right hip ([place]) 11/12/2018  . Vitamin D deficiency 02/25/2018  . Anemia, unspecified 01/08/2017  . Chronic pain 10/24/2015  . Hot flashes 10/24/2015  . Mood changes 10/24/2015  . Migraine without aura and without status migrainosus, not intractable 10/24/2015  . Metabolic syndrome 92/33/0076  . Dyslipidemia 10/24/2015  . Allergic rhinitis, seasonal 05/17/2015  . Asthma, mild intermittent 08/08/2014  . Chronic insomnia 08/08/2014  . DDD (  degenerative disc disease), lumbar 08/08/2014  . Mood disorder ([employer]) 08/08/2014  . Gastro-esophageal reflux disease without esophagitis 08/08/2014  . H/O total hysterectomy 08/08/2014  . Irritable bowel syndrome with diarrhea 08/08/2014  . Osteoarthritis of right hip 08/08/2014  . Adult BMI 30+ 08/08/2014  . SBO  (spina bifida occulta) 08/08/2014   PCP:  Steele Sizer, MD Pharmacy:   [facility] [address] [place] [address] Phone: [phone] Fax: [phone]     Social Determinants of Health (SDOH) Interventions    Readmission Risk Interventions No flowsheet data found.

## 2018-11-13 NOTE — Progress Notes (Signed)
Physical Therapy Treatment Patient Details Name: TYNESIA HARTLIEB MRN: [id number] DOB: 09-Feb-1970 Today's Date: 11/13/2018    History of Present Illness Pt is a 48 y.o. female s/p R THR 11/12/18.  PMH includes anemia, asthma, migraine, and PONV.    PT Comments    Pt reporting 5/10 R hip pain beginning/end of session (therapist attempted session earlier this afternoon but pt reporting needing pain meds so therapist returned later after pt received pain medication).  Pt able to progress to ambulating to 120 feet with youth sized RW CGA; overall pt steady with ambulation using walker.  Tolerated sitting ex's fairly well (pt reporting they felt good).  Will continue to progress pt with strengthening and functional mobility during hospitalization.    Follow Up Recommendations  Outpatient PT     Equipment Recommendations  Rolling walker with 5" wheels;3in1 (PT)(youth sized)    Recommendations for Other Services       Precautions / Restrictions Precautions Precautions: Fall;Anterior Hip Precaution Booklet Issued: Yes (comment) Restrictions Weight Bearing Restrictions: Yes RLE Weight Bearing: Weight bearing as tolerated    Mobility  Bed Mobility Overal bed mobility: Needs Assistance Bed Mobility: Supine to Sit;Sit to Supine     Supine to sit: Supervision;HOB elevated Sit to supine: Supervision;HOB elevated   General bed mobility comments: increased time and effort to perform (to move R LE)  Transfers Overall transfer level: Needs assistance Equipment used: Rolling walker (2 wheeled)(youth sized) Transfers: Sit to/from Stand Sit to Stand: Supervision       General transfer comment: fairly strong transfers noted; no vc's for UE positioning required; x2 trials from bed  Ambulation/Gait Ambulation/Gait assistance: Min guard Gait Distance (Feet): 120 Feet Assistive device: Rolling walker (2 wheeled)(youth sized)   Gait velocity: mildly decreased   General Gait Details:  decreased stance time R LE; partial step through gait pattern; good R heel strike; steady with RW use; increased R knee flexion beginning/end of R LE stance phase (less than AM session though)   Stairs             Wheelchair Mobility    Modified Rankin (Stroke Patients Only)       Balance Overall balance assessment: Needs assistance Sitting-balance support: No upper extremity supported;Feet supported Sitting balance-Leahy Scale: Normal Sitting balance - Comments: steady sitting reaching outside BOS   Standing balance support: No upper extremity supported Standing balance-Leahy Scale: Good Standing balance comment: steady standing reaching within BOS                            Cognition Arousal/Alertness: Awake/alert Behavior During Therapy: WFL for tasks assessed/performed Overall Cognitive Status: Within Functional Limits for tasks assessed                                        Exercises General Exercises - Lower Extremity Long Arc Quad: AROM;Strengthening;Both;10 reps;Seated Hip Flexion/Marching: AROM;Strengthening;Both;10 reps;Seated(increased effort for R LE movements)    General Comments General comments (skin integrity, edema, etc.): Minimal drainage noted R hip dressing end of session.  Pt agreeable to PT session.  Hospital youth sized walker adjusted to pt for appropriate fit and use.      Pertinent Vitals/Pain Pain Assessment: 0-10 Pain Score: 5  Pain Location: R hip Pain Descriptors / Indicators: Burning;Sore;Tender Pain Intervention(s): Limited activity within patient's tolerance;Monitored during session;Premedicated before session;Repositioned;Ice applied  Home Living                      Prior Function            PT Goals (current goals can now be found in the care plan section) Acute Rehab PT Goals Patient Stated Goal: to improve walking PT Goal Formulation: With patient Time For Goal Achievement:  11/26/18 Potential to Achieve Goals: Good Progress towards PT goals: Progressing toward goals    Frequency    BID      PT Plan Current plan remains appropriate    Co-evaluation              AM-PAC PT "6 Clicks" Mobility   Outcome Measure  Help needed turning from your back to your side while in a flat bed without using bedrails?: A Little Help needed moving from lying on your back to sitting on the side of a flat bed without using bedrails?: A Little Help needed moving to and from a bed to a chair (including a wheelchair)?: A Little Help needed standing up from a chair using your arms (e.g., wheelchair or bedside chair)?: A Little Help needed to walk in hospital room?: A Little Help needed climbing 3-5 steps with a railing? : A Little 6 Click Score: 18    End of Session Equipment Utilized During Treatment: Gait belt Activity Tolerance: Patient limited by pain Patient left: in bed;with call bell/phone within reach;with bed alarm set;with SCD's reapplied;Other (comment)(B heels floating via pillow; ice packs to R hip) Nurse Communication: Mobility status;Precautions PT Visit Diagnosis: Other abnormalities of gait and mobility (R26.89);Muscle weakness (generalized) (M62.81);Difficulty in walking, not elsewhere classified (R26.2);Pain Pain - Right/Left: Right Pain - part of body: Hip     Time: [id number] PT Time Calculation (min) (ACUTE ONLY): 26 min  Charges:  $Gait Training: 8-22 mins $Therapeutic Exercise: 8-22 mins                    Leitha Bleak, PT 11/13/18, 5:13 PM [phone]

## 2018-11-13 NOTE — Plan of Care (Signed)
Pt moving well, ambulates with standby assist. Pt reports pain of 8 even after all pain measures have been given.

## 2018-11-13 NOTE — Progress Notes (Signed)
  Subjective:  Patient reports pain as moderate.    Objective:   VITALS:   Vitals:   11/13/18 0410 11/13/18 0736 11/13/18 1208 11/13/18 1458  BP: 120/67 103/65 125/73 112/65  Pulse: 83 85 90 91  Resp: 20 17 18 18   Temp: 98.2 F (36.8 C) 98.6 F (37 C) 99.1 F (37.3 C) 98.3 F (36.8 C)  TempSrc: Oral Oral Oral Oral  SpO2: 94% 93% 97% 94%  Weight:      Height:        PHYSICAL EXAM:  ABD soft Sensation intact distally Dorsiflexion/Plantar flexion intact Incision: dressing C/D/I No cellulitis present Compartment soft  LABS  Results for orders placed or performed during the hospital encounter of 11/12/18 (from the past 24 hour(s))  CBC     Status: Abnormal   Collection Time: 11/13/18  4:48 AM  Result Value Ref Range   WBC 10.3 4.0 - 10.5 K/uL   RBC 3.73 (L) 3.87 - 5.11 MIL/uL   Hemoglobin 10.2 (L) 12.0 - 15.0 g/dL   HCT 32.1 (L) 36.0 - 46.0 %   MCV 86.1 80.0 - 100.0 fL   MCH 27.3 26.0 - 34.0 pg   MCHC 31.8 30.0 - 36.0 g/dL   RDW 14.1 11.5 - 15.5 %   Platelets 189 150 - 400 K/uL   nRBC 0.0 0.0 - 0.2 %  Basic metabolic panel     Status: Abnormal   Collection Time: 11/13/18  4:48 AM  Result Value Ref Range   Sodium 138 135 - 145 mmol/L   Potassium 3.8 3.5 - 5.1 mmol/L   Chloride 104 98 - 111 mmol/L   CO2 26 22 - 32 mmol/L   Glucose, Bld 126 (H) 70 - 99 mg/dL   BUN 11 6 - 20 mg/dL   Creatinine, Ser 0.55 0.44 - 1.00 mg/dL   Calcium 8.9 8.9 - 10.3 mg/dL   GFR calc non Af Amer >60 >60 mL/min   GFR calc Af Amer >60 >60 mL/min   Anion gap 8 5 - 15    Dg Hip Operative Unilat W Or W/o Pelvis Right  Result Date: 11/12/2018 CLINICAL DATA:  Right hip arthroplasty. EXAM: OPERATIVE RIGHT HIP (WITH PELVIS IF PERFORMED) 2 VIEWS TECHNIQUE: Fluoroscopic spot image(s) were submitted for interpretation post-operatively. COMPARISON:  None. FINDINGS: Intraoperative imaging with a C-arm demonstrates placement of a total hip arthroplasty on the right with normal alignment in the  frontal projection. IMPRESSION: Normal alignment of right hip arthroplasty. Electronically Signed   By: Aletta Edouard M.D.   On: 11/12/2018 12:09    Assessment/Plan: 1 Day Post-Op   Active Problems:   Osteonecrosis of right hip ([place])   Up with therapy D/C IV fluids Plan for discharge tomorrow   Lovell Sheehan , MD 11/13/2018, 3:34 PM

## 2018-11-13 NOTE — Progress Notes (Signed)
Physical Therapy Treatment Patient Details Name: Shelby Henson MRN: [id number] DOB: 1971/01/28 Today's Date: 11/13/2018    History of Present Illness Pt is a 48 y.o. female s/p R THR 11/12/18.  PMH includes anemia, asthma, migraine, and PONV.    PT Comments    Attempted to see pt mid-morning but pt was in bathroom washing up; upon later attempt pt laying in bed and tearful d/t significant R hip pain (nurse notified); pt agreeable to PT session on 3rd attempt (pain 6/10 R hip).  Pt tolerated LE bed level ex's fairly well (pt reporting ex's felt good).  Able to progress to ambulating 90 feet with RW CGA (limited d/t R hip pain).  Pain 5-6/10 R hip end of session at rest.  Will continue to focus on strengthening and progressive functional mobility per pt tolerance.    Follow Up Recommendations  Outpatient PT     Equipment Recommendations  Rolling walker with 5" wheels;3in1 (PT)    Recommendations for Other Services       Precautions / Restrictions Precautions Precautions: Fall;Anterior Hip Precaution Booklet Issued: Yes (comment) Restrictions Weight Bearing Restrictions: Yes RLE Weight Bearing: Weight bearing as tolerated    Mobility  Bed Mobility Overal bed mobility: Needs Assistance Bed Mobility: Supine to Sit;Sit to Supine     Supine to sit: Min assist;HOB elevated Sit to supine: Min assist;HOB elevated   General bed mobility comments: assist for R LE; mild increased effort to perform  Transfers Overall transfer level: Needs assistance Equipment used: Rolling walker (2 wheeled) Transfers: Sit to/from [facility] Sit to Stand: Min guard Stand pivot transfers: Min guard(stand step turn to [facility] over toilet)       General transfer comment: fairly strong transfers noted; no vc's for UE positioning required  Ambulation/Gait Ambulation/Gait assistance: Min guard Gait Distance (Feet): (90 feet; 20 feet (bathroom to bed)) Assistive device: Rolling walker  (2 wheeled)   Gait velocity: mildly decreased   General Gait Details: decreased stance time R LE; partial step through gait pattern; steady with RW use; increased R knee flexion beginning/end of R LE stance phase (pt reports for comfort d/t R hip pain)   Stairs             Wheelchair Mobility    Modified Rankin (Stroke Patients Only)       Balance Overall balance assessment: Needs assistance Sitting-balance support: No upper extremity supported;Feet supported Sitting balance-Leahy Scale: Normal Sitting balance - Comments: steady sitting reaching outside BOS   Standing balance support: No upper extremity supported Standing balance-Leahy Scale: Good Standing balance comment: steady standing washing hands at sink                            Cognition Arousal/Alertness: Awake/alert Behavior During Therapy: WFL for tasks assessed/performed Overall Cognitive Status: Within Functional Limits for tasks assessed                                        Exercises Total Joint Exercises Ankle Circles/Pumps: AROM;Strengthening;Both;10 reps;Supine Quad Sets: AROM;Strengthening;Both;10 reps;Supine Gluteal Sets: AROM;Strengthening;Both;10 reps;Supine Towel Squeeze: AROM;Strengthening;Both;10 reps;Supine(pillow between pt's knees) Short Arc Quad: AROM;Strengthening;Right;10 reps;Supine Heel Slides: AAROM;Strengthening;Right;10 reps;Supine Hip ABduction/ADduction: AAROM;Strengthening;Right;10 reps;Supine    General Comments General comments (skin integrity, edema, etc.): Minimal drainage noted R hip dressing end of session  Pt agreeable to PT session.  Pt's  daughter present during session.      Pertinent Vitals/Pain Pain Assessment: 0-10 Pain Score: 6  Pain Location: R hip Pain Descriptors / Indicators: Burning;Sore;Tender Pain Intervention(s): Limited activity within patient's tolerance;Monitored during session;Premedicated before  session;Repositioned;Ice applied  Vitals (HR and O2 on room air) stable and WFL throughout treatment session.    Home Living                      Prior Function            PT Goals (current goals can now be found in the care plan section) Acute Rehab PT Goals Patient Stated Goal: to improve walking PT Goal Formulation: With patient Time For Goal Achievement: 11/26/18 Potential to Achieve Goals: Good Progress towards PT goals: Progressing toward goals    Frequency    BID      PT Plan Current plan remains appropriate    Co-evaluation              AM-PAC PT "6 Clicks" Mobility   Outcome Measure  Help needed turning from your back to your side while in a flat bed without using bedrails?: A Little Help needed moving from lying on your back to sitting on the side of a flat bed without using bedrails?: A Little Help needed moving to and from a bed to a chair (including a wheelchair)?: A Little Help needed standing up from a chair using your arms (e.g., wheelchair or bedside chair)?: A Little Help needed to walk in hospital room?: A Little Help needed climbing 3-5 steps with a railing? : A Little 6 Click Score: 18    End of Session Equipment Utilized During Treatment: Gait belt Activity Tolerance: Patient limited by pain Patient left: in bed;with call bell/phone within reach;with bed alarm set;with family/visitor present;with SCD's reapplied;Other (comment)(B heels floating via pillows; 2 ice packs to R hip) Nurse Communication: Mobility status;Precautions PT Visit Diagnosis: Other abnormalities of gait and mobility (R26.89);Muscle weakness (generalized) (M62.81);Difficulty in walking, not elsewhere classified (R26.2);Pain Pain - Right/Left: Right Pain - part of body: Hip     Time: [id number] PT Time Calculation (min) (ACUTE ONLY): 41 min  Charges:  $Gait Training: 8-22 mins $Therapeutic Exercise: 8-22 mins $Therapeutic Activity: 8-22 mins                     Leitha Bleak, PT 11/13/18, 2:07 PM [phone]

## 2018-11-14 LAB — CBC
HCT: 31.3 % — ABNORMAL LOW (ref 36.0–46.0)
Hemoglobin: 9.7 g/dL — ABNORMAL LOW (ref 12.0–15.0)
MCH: 26.8 pg (ref 26.0–34.0)
MCHC: 31 g/dL (ref 30.0–36.0)
MCV: 86.5 fL (ref 80.0–100.0)
Platelets: 187 10*3/uL (ref 150–400)
RBC: 3.62 MIL/uL — ABNORMAL LOW (ref 3.87–5.11)
RDW: 14.3 % (ref 11.5–15.5)
WBC: 11.6 10*3/uL — ABNORMAL HIGH (ref 4.0–10.5)
nRBC: 0 % (ref 0.0–0.2)

## 2018-11-14 MED ORDER — OXYCODONE HCL 10 MG PO TABS: 5.0000 mg | ORAL_TABLET | ORAL | 0 refills | Status: AC | PRN

## 2018-11-14 MED ORDER — DOCUSATE SODIUM 100 MG PO CAPS: 100.0000 mg | ORAL_CAPSULE | Freq: Two times a day (BID) | ORAL | 0 refills | Status: DC

## 2018-11-14 MED ORDER — METHOCARBAMOL 500 MG PO TABS: 500.0000 mg | ORAL_TABLET | Freq: Four times a day (QID) | ORAL | 1 refills | Status: DC | PRN

## 2018-11-14 MED ORDER — RIVAROXABAN 10 MG PO TABS: 10.0000 mg | ORAL_TABLET | Freq: Every day | ORAL | 0 refills | Status: DC

## 2018-11-14 NOTE — Progress Notes (Signed)
Physical Therapy Treatment Patient Details Name: Shelby Henson MRN: [id number] DOB: 1970/03/21 Today's Date: 11/14/2018    History of Present Illness Pt is a 48 y.o. female s/p R THR 11/12/18.  PMH includes anemia, asthma, migraine, and PONV.    PT Comments    Pt sitting in recliner finishing breakfast upon PT arrival and agreeable to PT session. 5/10 R hip pain beginning of session at rest and 4/10 end of session resting in bed.  Able to perform transfers modified independently and ambulate around nursing station with youth sized RW modified independently; also able to navigate 4 steps with B railings CGA.  Youth sized walker delivered to pt's room prior to PT session and therapist adjusted walker to appropriate fit for pt use.  Reviewed car transfer technique and pt verbalizing appropriate understanding.  Pt reports OP PT appt set for Monday.  Pt appears safe to discharge home when medically appropriate.   Follow Up Recommendations  Outpatient PT     Equipment Recommendations  Rolling walker with 5" wheels;3in1 (PT)(youth sized)    Recommendations for Other Services       Precautions / Restrictions Precautions Precautions: Fall;Anterior Hip Precaution Booklet Issued: Yes (comment) Restrictions Weight Bearing Restrictions: Yes RLE Weight Bearing: Weight bearing as tolerated    Mobility  Bed Mobility Overal bed mobility: Needs Assistance Bed Mobility: Sit to Supine       Sit to supine: Supervision   General bed mobility comments: mild increased time and effort to perform (to move R LE on own with UE assist)  Transfers Overall transfer level: Modified independent Equipment used: Rolling walker (2 wheeled)(youth sized) Transfers: Sit to/from [facility] Sit to Stand: Modified independent (Device/Increase time) Stand pivot transfers: Modified independent (Device/Increase time)       General transfer comment: strong transfers noted; x1 trial from  recliner; x2 trials from mat table; x1 trial from bed; x1 trial from [facility] over toilet  Ambulation/Gait Ambulation/Gait assistance: Modified independent (Device/Increase time) Gait Distance (Feet): (90 feet; 180 feet; 25 feet x2 (to bathroom and back)) Assistive device: Rolling walker (2 wheeled)(youth sized)   Gait velocity: mildly decreased   General Gait Details: decreased stance time R LE; partial step through gait pattern; good R heel strike; steady with RW use; increased R knee flexion end of R LE stance phase (with initial L heelstrike) but improved with vc's for decreasing L LE step length (anticipate increased R knee flexion at this time to assist with increasing R hip flexion to decrease R hip pain); mild R LE internal rotation during R LE swing phase (appears d/t R hip pain and to assist with clearing R LE during R LE advancement); vc's to improve gait technique   Stairs Stairs: Yes Stairs assistance: Min guard Stair Management: Two rails;Step to pattern;Forwards Number of Stairs: 4 General stair comments: initial vc's and demo for technique (including R LE quad set when advancing L LE to ascend step and to step down with L LE from step); steady   Wheelchair Mobility    Modified Rankin (Stroke Patients Only)       Balance Overall balance assessment: Needs assistance Sitting-balance support: No upper extremity supported;Feet supported Sitting balance-Leahy Scale: Normal Sitting balance - Comments: steady sitting reaching outside BOS   Standing balance support: No upper extremity supported Standing balance-Leahy Scale: Good Standing balance comment: steady standing washing hands at sink  Cognition Arousal/Alertness: Awake/alert Behavior During Therapy: WFL for tasks assessed/performed Overall Cognitive Status: Within Functional Limits for tasks assessed                                        Exercises General  Exercises - Lower Extremity Long Arc Quad: AROM;Strengthening;Right;10 reps;Seated Hip Flexion/Marching: AAROM;Strengthening;Right;10 reps;Seated(pt using UE to assist with exercise)    General Comments  Pt agreeable to PT session.      Pertinent Vitals/Pain Pain Assessment: 0-10 Pain Score: 4  Pain Location: R hip Pain Descriptors / Indicators: Burning;Sore;Tender Pain Intervention(s): Limited activity within patient's tolerance;Monitored during session;Premedicated before session;Repositioned;Ice applied    Home Living                      Prior Function            PT Goals (current goals can now be found in the care plan section) Acute Rehab PT Goals Patient Stated Goal: to improve walking PT Goal Formulation: With patient Time For Goal Achievement: 11/26/18 Potential to Achieve Goals: Good Progress towards PT goals: Progressing toward goals    Frequency    BID      PT Plan Current plan remains appropriate    Co-evaluation              AM-PAC PT "6 Clicks" Mobility   Outcome Measure  Help needed turning from your back to your side while in a flat bed without using bedrails?: None Help needed moving from lying on your back to sitting on the side of a flat bed without using bedrails?: None Help needed moving to and from a bed to a chair (including a wheelchair)?: None Help needed standing up from a chair using your arms (e.g., wheelchair or bedside chair)?: None Help needed to walk in hospital room?: None Help needed climbing 3-5 steps with a railing? : A Little 6 Click Score: 23    End of Session Equipment Utilized During Treatment: Gait belt Activity Tolerance: Patient tolerated treatment well Patient left: in bed;with call bell/phone within reach;with bed alarm set;with nursing/sitter in room;with SCD's reapplied;Other (comment)(B heels floating via pillows; ice packs to L hip) Nurse Communication: Mobility status;Precautions;Other  (comment)(Nurse and NT notified of pt's request for more ice for ice packs) PT Visit Diagnosis: Other abnormalities of gait and mobility (R26.89);Muscle weakness (generalized) (M62.81);Difficulty in walking, not elsewhere classified (R26.2);Pain Pain - Right/Left: Right Pain - part of body: Hip     Time: [id number] PT Time Calculation (min) (ACUTE ONLY): 40 min  Charges:  $Gait Training: 8-22 mins $Therapeutic Exercise: 8-22 mins $Therapeutic Activity: 8-22 mins                     Leitha Bleak, PT 11/14/18, 10:41 AM [phone]

## 2018-11-14 NOTE — Progress Notes (Signed)
  Subjective:  Patient reports pain as mild to moderate.    Objective:   VITALS:   Vitals:   11/13/18 1208 11/13/18 1458 11/13/18 2307 11/14/18 0030  BP: 125/73 112/65 120/73   Pulse: 90 91 97   Resp: 18 18 19    Temp: 99.1 F (37.3 C) 98.3 F (36.8 C) 100.2 F (37.9 C) 99.1 F (37.3 C)  TempSrc: Oral Oral Oral Oral  SpO2: 97% 94% 95%   Weight:      Height:        PHYSICAL EXAM:  Neurologically intact ABD soft Neurovascular intact Sensation intact distally Intact pulses distally Dorsiflexion/Plantar flexion intact Incision: dressing C/D/I No cellulitis present Compartment soft  LABS  Results for orders placed or performed during the hospital encounter of 11/12/18 (from the past 24 hour(s))  CBC     Status: Abnormal   Collection Time: 11/14/18  4:12 AM  Result Value Ref Range   WBC 11.6 (H) 4.0 - 10.5 K/uL   RBC 3.62 (L) 3.87 - 5.11 MIL/uL   Hemoglobin 9.7 (L) 12.0 - 15.0 g/dL   HCT 31.3 (L) 36.0 - 46.0 %   MCV 86.5 80.0 - 100.0 fL   MCH 26.8 26.0 - 34.0 pg   MCHC 31.0 30.0 - 36.0 g/dL   RDW 14.3 11.5 - 15.5 %   Platelets 187 150 - 400 K/uL   nRBC 0.0 0.0 - 0.2 %    Dg Hip Operative Unilat W Or W/o Pelvis Right  Result Date: 11/12/2018 CLINICAL DATA:  Right hip arthroplasty. EXAM: OPERATIVE RIGHT HIP (WITH PELVIS IF PERFORMED) 2 VIEWS TECHNIQUE: Fluoroscopic spot image(s) were submitted for interpretation post-operatively. COMPARISON:  None. FINDINGS: Intraoperative imaging with a C-arm demonstrates placement of a total hip arthroplasty on the right with normal alignment in the frontal projection. IMPRESSION: Normal alignment of right hip arthroplasty. Electronically Signed   By: Aletta Edouard M.D.   On: 11/12/2018 12:09    Assessment/Plan: 2 Days Post-Op   Active Problems:   Osteonecrosis of right hip ([place])   Advance diet Up with therapy  Discharge home today after PT   Carlynn Spry , PA-C 11/14/2018, 6:53 AM

## 2018-11-14 NOTE — Discharge Instructions (Signed)

## 2018-11-14 NOTE — Discharge Summary (Signed)
Physician Discharge Summary  Patient ID: Shelby Henson MRN: [id number] DOB/AGE: Sep 17, 1970 48 y.o.  Admit date: 11/12/2018 Discharge date: 11/14/2018  Admission Diagnoses:  M16.11 unilateral primary osteoarthritis right hip <principal problem not specified>  Discharge Diagnoses:  M16.11 unilateral primary osteoarthritis right hip Active Problems:   Osteonecrosis of right hip [facility])   Past Medical History:  Diagnosis Date  . Anemia   . Asthma    well controlled  . Complication of anesthesia   . Depression   . GERD (gastroesophageal reflux disease)    occ  . Migraine   . Osteoarthritis   . PONV (postoperative nausea and vomiting)    with c-section x 1    Surgeries: Procedure(s): TOTAL HIP ARTHROPLASTY ANTERIOR APPROACH on 11/12/2018   Consultants (if any):   Discharged Condition: Improved  Hospital Course: Shelby Henson is an 48 y.o. female who was admitted 11/12/2018 with a diagnosis of  M16.11 unilateral primary osteoarthritis right hip <principal problem not specified> and went to the operating room on 11/12/2018 and underwent the above named procedures.    She was given perioperative antibiotics:  Anti-infectives (From admission, onward)   Start     Dose/Rate Route Frequency Ordered Stop   11/12/18 1100  clindamycin (CLEOCIN) IVPB 600 mg     600 mg 100 mL/hr over 30 Minutes Intravenous Every 6 hours 11/12/18 1058 11/12/18 1812   11/12/18 0815  50,000 units bacitracin in 0.9% normal saline 250 mL irrigation  Status:  Discontinued       As needed 11/12/18 0826 11/12/18 0948   11/12/18 0604  clindamycin (CLEOCIN) 900 MG/50ML IVPB    Note to Pharmacy: Leonia Reader   : cabinet override      11/12/18 0604 11/12/18 0748   11/12/18 0600  clindamycin (CLEOCIN) IVPB 900 mg     900 mg 100 mL/hr over 30 Minutes Intravenous On call to O.R. 11/11/18 2251 11/12/18 0803    .  She was given sequential compression devices, early ambulation, and Xarelto for DVT  prophylaxis.  She benefited maximally from the hospital stay and there were no complications.    Recent vital signs:  Vitals:   11/13/18 2307 11/14/18 0030  BP: 120/73   Pulse: 97   Resp: 19   Temp: 100.2 F (37.9 C) 99.1 F (37.3 C)  SpO2: 95%     Recent laboratory studies:  Lab Results  Component Value Date   HGB 9.7 (L) 11/14/2018   HGB 10.2 (L) 11/13/2018   HGB 12.6 11/05/2018   Lab Results  Component Value Date   WBC 11.6 (H) 11/14/2018   PLT 187 11/14/2018   Lab Results  Component Value Date   INR 0.9 11/05/2018   Lab Results  Component Value Date   NA 138 11/13/2018   K 3.8 11/13/2018   CL 104 11/13/2018   CO2 26 11/13/2018   BUN 11 11/13/2018   CREATININE 0.55 11/13/2018   GLUCOSE 126 (H) 11/13/2018    Discharge Medications:   Allergies as of 11/14/2018      Reactions   Celecoxib Hives   Prednisone    intolerance, makes her angry. Makes her angry   Topamax [topiramate]    Mood changes   Zolpidem    Codeine Rash   Penicillins Rash   Did it involve swelling of the face/tongue/throat, SOB, or low BP? No Did it involve sudden or severe rash/hives, skin peeling, or any reaction on the inside of your mouth or nose? Yes Did you  need to seek medical attention at a hospital or doctor's office? Yes When did it last happen? More than 20 years ago If all above answers are "NO", may proceed with cephalosporin use.      Medication List    STOP taking these medications   aspirin-acetaminophen-caffeine 250-250-65 MG tablet Commonly known as: EXCEDRIN MIGRAINE     TAKE these medications   albuterol (2.5 MG/3ML) 0.083% nebulizer solution Commonly known as: PROVENTIL Take 3 mLs (2.5 mg total) by nebulization every 6 (six) hours as needed for wheezing or shortness of breath.   APPLE CIDER VINEGAR PO Take 2 tablets by mouth daily. [place] Apple Cider Vinegar Gummy Vitamins   budesonide 0.5 MG/2ML nebulizer solution Commonly known as: Pulmicort Take  2 mLs (0.5 mg total) by nebulization 2 (two) times daily. What changed:   when to take this  reasons to take this   desvenlafaxine 50 MG 24 hr tablet Commonly known as: PRISTIQ Take 1 tablet (50 mg total) by mouth daily. What changed: when to take this   docusate sodium 100 MG capsule Commonly known as: COLACE Take 1 capsule (100 mg total) by mouth 2 (two) times daily.   hydrOXYzine 10 MG tablet Commonly known as: ATARAX/VISTARIL Take 1 tablet (10 mg total) by mouth 3 (three) times daily as needed. For anxiety   methocarbamol 500 MG tablet Commonly known as: ROBAXIN Take 1 tablet (500 mg total) by mouth every 6 (six) hours as needed for muscle spasms.   Oxycodone HCl 10 MG Tabs Take 0.5-1 tablets (5-10 mg total) by mouth every 4 (four) hours as needed for up to 5 days for severe pain (pain score 7-10). What changed:   how much to take  when to take this  reasons to take this   QUEtiapine 25 MG tablet Commonly known as: SEROquel Take 1 tablet (25 mg total) by mouth at bedtime.   rivaroxaban 10 MG Tabs tablet Commonly known as: XARELTO Take 1 tablet (10 mg total) by mouth daily with breakfast.            Durable Medical Equipment  (From admission, onward)         Start     Ordered   11/14/18 0703  For home use only DME 3 n 1  Once     11/14/18 0703   11/14/18 0703  For home use only DME Walker rolling  Once    Question:  Patient needs a walker to treat with the following condition  Answer:  Osteoarthritis of right hip   11/14/18 0703   11/12/18 1059  DME Walker rolling  Once    Question:  Patient needs a walker to treat with the following condition  Answer:  Status post total hip replacement, right   11/12/18 1058   11/12/18 1059  DME 3 n 1  Once     11/12/18 1058   11/12/18 1059  DME Bedside commode  Once    Question:  Patient needs a bedside commode to treat with the following condition  Answer:  Status post total hip replacement, right   11/12/18 1058           Diagnostic Studies: Dg Hip Operative Unilat W Or W/o Pelvis Right  Result Date: 11/12/2018 CLINICAL DATA:  Right hip arthroplasty. EXAM: OPERATIVE RIGHT HIP (WITH PELVIS IF PERFORMED) 2 VIEWS TECHNIQUE: Fluoroscopic spot image(s) were submitted for interpretation post-operatively. COMPARISON:  None. FINDINGS: Intraoperative imaging with a C-arm demonstrates placement of a total hip arthroplasty  on the right with normal alignment in the frontal projection. IMPRESSION: Normal alignment of right hip arthroplasty. Electronically Signed   By: Aletta Edouard M.D.   On: 11/12/2018 12:09    Disposition: Discharge disposition: 01-Home or Self Care            Signed: Carlynn Spry ,PA-C 11/14/2018, 7:03 AM

## 2018-11-14 NOTE — TOC Progression Note (Signed)
Transition of Care [facility]) - Progression Note    Patient Details  Name: KAMOREE LOFTUS MRN: [id number] Date of Birth: 10-Feb-1970  Transition of Care [facility]) CM/SW [place], RN Phone Number: 11/14/2018, 8:32 AM  Clinical Narrative:    Merrilee Seashore with [employer] and notified him that the patient's walker is too tall for her and she will need a youth walker delivered.  He stated that he will switch them out.  I notified him that the patient has a DC order.   Expected Discharge Plan: OP Rehab Barriers to Discharge: Continued Medical Work up  Expected Discharge Plan and Services Expected Discharge Plan: OP Rehab   Discharge Planning Services: CM Consult   Living arrangements for the past 2 months: Single Family Home Expected Discharge Date: 11/14/18               DME Arranged: Gilford Rile rolling DME Agency: [employer] Date DME Agency Contacted: 11/13/18 Time DME Agency Contacted: [phone] Representative spoke with at DME Agency: Gracy Racer Arranged: NA           Social Determinants of Health ([place]) Interventions    Readmission Risk Interventions No flowsheet data found.

## 2018-11-14 NOTE — Plan of Care (Signed)
Patient meeting all goals for discharge home with family

## 2018-11-17 ENCOUNTER — Other Ambulatory Visit: Payer: Self-pay | Admitting: *Deleted

## 2018-11-17 NOTE — Patient Outreach (Signed)
[facility]) Care Management  11/17/2018  Shelby Henson 1971/01/06 [id number]   Transition of care call/case closure   Referral received: 11/13/18 Initial outreach: 11/17/18 Insurance: [place] Focus Plan   Subjective: Initial successful telephone call to patient's preferred number in order to complete transition of care assessment; 2 HIPAA identifiers verified. Explained purpose of call and completed transition of care assessment.  Shelby Henson states she is not doing well as her surgical pain is not well managed with the prescribed medications. She says the Robaxin caused muscle spasms instead of treating them so she stopped taking it. She also says she was on oxycodone for 3 years prior to this hip surgery and the current dose of narcotic analgesic is not relieving  Her pain adequately. She says her surgical incision is unremarkable, she is tolerating her diet, denies bowel or bladder problems.  Her daughter is assisting with her recovery.  She says she attended her first outpatient rehabilitation session today and premedicated before the session but her pain is not well managed.  She denies any ongoing health issues and says she does not need a referral to one of the [place] chronic disease management programs.  She says she doesn't know if she purchased the hospital indemnity insurance. She says she uses a [facility] outpatient pharmacy.  She denies educational needs related to staying safe during the OVID 19 pandemic.    Objective:  Shelby Henson was hospitalized at [facility] from 10/14-/1016/2020 for right total hip arthroplasty to treat ostearthritis and osteonecrosis of the right hip. Comorbidities include: migraines, asthma, GERD, IBS, spina bifida occulta, degenerative disc disease, osteoarthritis, anemia, chronic insomnia, chronic pain, depression, hyperlipidemia not requiring medication at present, Vitamin D deficiency She was discharged to home on  11/14/18 with a rolling walker and 3 in 1 per the discharge summary and referral to outpatient rehabilitation.   Assessment:  Patient voices good understanding of all discharge instructions.  See transition of care flowsheet for assessment details.   Plan:  Discussed post operative pain management strategies including notifying her surgeon that the Robaxin and the current dose of oxycodone is not effective. Also encouraged [name] to apply ice to her hip after her outpatient physical therapy sessions and after she performs her home exercise routine.  Reviewed hospital discharge diagnosis of right hip arthroplasty and discharge treatment plan using hospital discharge instructions, assessing medication adherence, reviewing problems requiring provider notification, and discussing the importance of follow up with surgeon as directed. Reviewed [name]'s announcements that all [name] members will receive the Healthy Lifestyle Premium rate in 2021.  Reminded [name] that this is open  enrollment for 2021 [facility] benefits. With her permission , using her personal e-mail address, emailed a link to complete 2021 benefits enrollment and to determine if she has the hospital indemnity insurance and a link to attend a virtual  benefits fair specific to the Focus Plan since she currently has the Focus Plan and is planning on keeping it for 2021.  No ongoing care management needs identified so will close case to [facility] services and route successful outreach letter with [place] Management pamphlet and 24 Hour Nurse Line Magnet to [place] Management clinical pool to be mailed to patient's home address.  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  Barrington Ellison RN,CCM,CDE [place] Management Coordinator Office Phone [phone] Office Fax [phone]

## 2018-11-18 ENCOUNTER — Encounter: Payer: Self-pay | Admitting: *Deleted

## 2018-11-26 ENCOUNTER — Ambulatory Visit: Payer: No Typology Code available for payment source | Attending: Orthopedic Surgery

## 2018-11-26 ENCOUNTER — Other Ambulatory Visit: Payer: Self-pay

## 2018-11-26 DIAGNOSIS — M6281 Muscle weakness (generalized): Secondary | ICD-10-CM | POA: Diagnosis present

## 2018-11-26 DIAGNOSIS — M25551 Pain in right hip: Secondary | ICD-10-CM | POA: Diagnosis not present

## 2018-11-26 NOTE — Therapy (Signed)
[place] MAIN [facility] SERVICES [address] [place], [place], [address] Phone: [phone]   Fax:  [phone]  Physical Therapy Evaluation  Patient Details  Name: CHRISTINE PANZARELLA MRN: [id number] Date of Birth: [birth year] Referring Provider (PT): Dr. Harlow Mares   Encounter Date: 11/26/2018  PT End of Session - 11/26/18 1033    Visit Number  1    Number of Visits  25    PT Start Time  1033    PT Stop Time  1115    PT Time Calculation (min)  42 min    Equipment Utilized During Treatment  Gait belt    Activity Tolerance  Patient tolerated treatment well;Patient limited by pain    Behavior During Therapy  [facility] for tasks assessed/performed       Past Medical History:  Diagnosis Date  . Anemia   . Asthma    well controlled  . Complication of anesthesia   . Depression   . GERD (gastroesophageal reflux disease)    occ  . Migraine   . Osteoarthritis   . PONV (postoperative nausea and vomiting)    with c-section x 1    Past Surgical History:  Procedure Laterality Date  . ABDOMINAL HYSTERECTOMY    . ABLATION    . BREAST CYST ASPIRATION Left   . CESAREAN SECTION    . CESAREAN SECTION    . CESAREAN SECTION    . LAPAROSCOPY  2018  . LEEP    . TOTAL HIP ARTHROPLASTY Right 11/12/2018   Procedure: TOTAL HIP ARTHROPLASTY ANTERIOR APPROACH;  Surgeon: Lovell Sheehan, MD;  Location: [facility] ORS;  Service: Orthopedics;  Laterality: Right;  . TOTAL VAGINAL HYSTERECTOMY      There were no vitals filed for this visit.   Subjective Assessment - 11/26/18 1038    Subjective  evaluation s/p R anterior approach total hip    Patient is accompained by:  Family member    Pertinent History  Pt presents s/p right anterior total hip arthroplasty on 11/12/18.  She has completed 2 sessions of PT at another facility, however, her treatment sessions were not one-on-one, and she was not getting the feedback she was looking for on her exercises.    Limitations   Sitting;Standing;Walking;Lifting;House hold activities    How long can you sit comfortably?  45 min    How long can you stand comfortably?  30 min    How long can you walk comfortably?  15 min    Patient Stated Goals  would like to be able to go back to the gym and have no pain; would like to be able to cook and clean again    Currently in Pain?  Yes    Pain Score  5     Pain Location  Hip    Pain Orientation  Right    Pain Descriptors / Indicators  Aching;Burning    Pain Type  Surgical pain    Pain Onset  1 to 4 weeks ago    Pain Frequency  Constant    Pain Relieving Factors  ice         TREATMENT   SUBJECTIVE Chief complaint:  Pt presents s/p r anterior total hip arthroplasty on 11/12/18.  She has completed 2 sessions of PT at another facility, however, her treatment sessions were not one-on-one, and she was not getting the feedback she was looking for on her exercises.   Onset: 11/12/18 Referring Dx: R anterior total hip arthroplasty  MD: Dr. Harlow Mares Pain: 5/10 Present, 4/10 Best, 9/10 Worst Aggravating factors:  Easing factors: icing 24 hour pain behavior: worse at night    OBJECTIVE  Mental Status Patient is oriented to person, place and time.  Recent memory is intact.  Remote memory is intact.  Attention span and concentration are intact.  Expressive speech is intact.  Patient's fund of knowledge is within normal limits for educational level.  SENSATION: Grossly intact to light touch bilateral LEs as determined by testing dermatomes L2-S2 Proprioception and hot/cold testing deferred on this date   MUSCULOSKELETAL: Tremor: None Bulk: Normal Tone: Normal  Gait Antalgic gait pattern with RLE, utilizing a RW. Decreased speed.     Strength (out of 5) Right 3+/5 Hip flexion 2+/5 Hip ER (limited by pain and motion) 3+/5 Hip IR 3+/5 Hip abduction 2+/5 Hip extension 5/5 Knee extension 4/5 Knee flexion   AROM (degrees) R/L (all movements include  overpressure unless otherwise stated)  Hip IR (0-45): R: 28 Hip ER (0-45): R: 12 Hip Flexion (0-125): 108 Hip Abduction (0-40): R: 20 Hip extension (0-15): R: -2 (lacking 2 degrees from neutral)         [facility] PT Assessment - 11/26/18 1043      Assessment   Medical Diagnosis  anterior total hip arthroplasty    Referring Provider (PT)  Dr. Harlow Mares    Onset Date/Surgical Date  11/12/18    Hand Dominance  Right    Next MD Visit  12/23/18    Prior Therapy  OP PT at [facility]      Precautions   Precautions  Fall      Restrictions   Weight Bearing Restrictions  No      Balance Screen   Has the patient fallen in the past 6 months  No      [place] residence    Living Arrangements  Children   son   Available Help at Discharge  Available 24 hours/day    Type of Home  Mobile home    Home Access  Stairs to enter    Entrance Stairs-Number of Steps  6    Entrance Stairs-Rails  Right;Left;Can reach both    Home Layout  One level    [place] - 2 wheels;Cane - single point      Prior Function   Level of Independence  Independent    Vocation  Full time employment   nurse tech     Cognition   Overall Cognitive Status  Within Functional Limits for tasks assessed      Observation/Other Assessments   Other Surveys   Lower Extremity Functional Scale    Lower Extremity Functional Scale   9/80                Objective measurements completed on examination: See above findings.         Treatment:  There-ex: Nustep 5 min L2; during discussion of past HEP and exercises she had been working on in her previous PT sessions  R SLRs with assistance provided under heel  x10  4 way hip without resistance x10; BUE on RW throughout  Step up/downs onto 1st step with BUE support on handraills reports pain in inner thigh x10          PT Short Term Goals - 11/27/18 1028      PT SHORT TERM GOAL #1   Title  Pt  will be independent with HEP  in order to improve strength and decrease back pain in order to improve pain-free function at home and work.    Time  6    Period  Weeks    Status  New    Target Date  01/07/19        PT Long Term Goals - 11/27/18 1029      PT LONG TERM GOAL #1   Title  Pt will increase her LEFS to greater than 60/80 in order to demonstrate a clinically significant change in order to improve her ability to perform ADLs and IADLs with less difficulty and to improve her quality of life.    Baseline  11/26/18: 9/80    Time  12    Period  Weeks    Status  New    Target Date  02/19/19      PT LONG TERM GOAL #2   Title  Pt will improve her R hip ROM to within 10 degrees of her L hip ROM  in order to improve her ability to perform ADLs and IADLs with less difficulty and to improve her quality of life.    Baseline  11/26/18: 28 degrees of R hip IR, 12 degrees of R hip ER, 108 degrees of hip flexion, 20 deg of hip abduction, and -2 deg of hip extension    Time  12    Period  Weeks    Status  New    Target Date  02/19/19      PT LONG TERM GOAL #3   Title  Pt will improve her R hip strength to a 4+/5 in order to improve her ability to perform ADLs and IADLs with less difficulty and to improve her quality of life.    Baseline  11/26/18: 3+/5 hip flexion, 2+/5 hip ER (limited by pain and ROM), 3+/5 on hip IR, 3+/5 on hip abduction, 2+/5 on hip extension, 5/5 on knee extension, and 4/5 on knee flexion.    Time  12    Period  Weeks    Status  New    Target Date  02/19/19      PT LONG TERM GOAL #4   Title  Pt will decrease worst hip pain as reported on NPRS by at least 3 points in order to demonstrate clinically significant reduction in hip pain.    Baseline  11/23/18: 9/10    Time  12    Period  Weeks    Status  New    Target Date  02/19/19             Plan - 11/26/18 1734    Clinical Impression Statement  Pt is a pleasant 48 year-old female referred s/p an anterior  approach total hip arthroplasty on 11/12/18.  PT examination reveals deficits with strength, pain and ROM.  Today, she endorses her worst pain as a 9/10 and her best pain is endorsed as a 4/10.  She presents with the following strength deficits based on MMT: 3+/5 hip flexion, 2+/5 hip ER (limited by pain and ROM), 3+/5 on hip IR, 3+ on hip abduction, 2+ on hip extension, 5/5 on knee extension, and 4/5 on knee flexion.  She presents with 28 degrees of R hip IR, 12 degrees of R hip ER, 108 degrees of hip flexion, 20 deg of hip abduction, and lacking 2 deg of hip extension from neutral.  Pt presents with deficits in strength, mobility, range of motion, and pain. Pt will benefit from skilled PT services  to address deficits and return to pain-free function at home and work.    Personal Factors and Comorbidities  Comorbidity 3+    Comorbidities  DDD, migraine, IBS,    Examination-Activity Limitations  Bend;Lift;Squat;Caring for Others;Locomotion Level;Stairs;Carry;Stand    Examination-Participation Restrictions  Interpersonal Relationship;Yard Work;Cleaning;Laundry;Community Activity;Shop;Driving;Meal Prep    Stability/Clinical Decision Making  Stable/Uncomplicated    Clinical Decision Making  Low    Rehab Potential  Good    PT Frequency  2x / week    PT Duration  12 weeks    PT Treatment/Interventions  Cryotherapy;Electrical Stimulation;Iontophoresis 4mg /ml Dexamethasone;Moist Heat;Ultrasound;DME Instruction;Gait training;Stair training;Functional mobility training;Therapeutic activities;Therapeutic exercise;Balance training;Neuromuscular re-education;Patient/family education;Manual techniques;Scar mobilization;Passive range of motion;Dry needling;Taping;Joint Manipulations;ADLs/Self Care Home Management;Aquatic Therapy;Canalith Repostioning;Vestibular;Spinal Manipulations    PT Next Visit Plan  progress strength and ROM per protocol    PT Home Exercise Plan  develop HEP    Consulted and Agree with Plan of  Care  Patient;Family member/caregiver       Patient will benefit from skilled therapeutic intervention in order to improve the following deficits and impairments:  Decreased balance, Decreased endurance, Decreased range of motion, Pain, Decreased strength, Abnormal gait  Visit Diagnosis: Pain in right hip  Muscle weakness (generalized)     Problem List Patient Active Problem List   Diagnosis Date Noted  . Osteonecrosis of right hip ([place]) 11/12/2018  . Vitamin D deficiency 02/25/2018  . Anemia, unspecified 01/08/2017  . Chronic pain 10/24/2015  . Hot flashes 10/24/2015  . Mood changes 10/24/2015  . Migraine without aura and without status migrainosus, not intractable 10/24/2015  . Metabolic syndrome [id number]  . Dyslipidemia 10/24/2015  . Allergic rhinitis, seasonal 05/17/2015  . Asthma, mild intermittent 08/08/2014  . Chronic insomnia 08/08/2014  . DDD (degenerative disc disease), lumbar 08/08/2014  . Mood disorder ([place]) 08/08/2014  . Gastro-esophageal reflux disease without esophagitis 08/08/2014  . H/O total hysterectomy 08/08/2014  . Irritable bowel syndrome with diarrhea 08/08/2014  . Osteoarthritis of right hip 08/08/2014  . Adult BMI 30+ 08/08/2014  . SBO (spina bifida occulta) 08/08/2014    This entire session was performed under direct supervision and direction of a licensed therapist/therapist assistant . I have personally read, edited and approve of the note as written.   Lutricia Horsfall, SPT [facility] PT, DPT, GCS  Huprich,Jason 11/27/2018, 5:14 PM  [place] MAIN [facility] SERVICES [address] [place], [place], [address] Phone: [phone]   Fax:  [phone]  Name: RAMATA GENAO MRN: [id number] Date of Birth: 07/27/1970

## 2018-12-02 ENCOUNTER — Encounter: Payer: Self-pay | Admitting: Family Medicine

## 2018-12-03 ENCOUNTER — Other Ambulatory Visit: Payer: Self-pay

## 2018-12-03 ENCOUNTER — Ambulatory Visit: Payer: No Typology Code available for payment source | Attending: Orthopedic Surgery

## 2018-12-03 DIAGNOSIS — M25551 Pain in right hip: Secondary | ICD-10-CM | POA: Diagnosis not present

## 2018-12-03 DIAGNOSIS — M6281 Muscle weakness (generalized): Secondary | ICD-10-CM | POA: Diagnosis present

## 2018-12-03 NOTE — Therapy (Signed)
[place] MAIN [facility] SERVICES [address] [place], [place], [address] Phone: [phone]   Fax:  [phone]  Physical Therapy Treatment  Patient Details  Name: Shelby Henson MRN: [id number] Date of Birth: 25-Oct-1970 Referring Provider (PT): Dr. Harlow Mares   Encounter Date: 12/03/2018  PT End of Session - 12/03/18 1254    Visit Number  2    Number of Visits  25    Date for PT Re-Evaluation  02/18/19    PT Start Time  1020    PT Stop Time  1105    PT Time Calculation (min)  45 min    Equipment Utilized During Treatment  Gait belt    Activity Tolerance  Patient tolerated treatment well;Patient limited by pain    Behavior During Therapy  [facility] for tasks assessed/performed       Past Medical History:  Diagnosis Date  . Anemia   . Asthma    well controlled  . Complication of anesthesia   . Depression   . GERD (gastroesophageal reflux disease)    occ  . Migraine   . Osteoarthritis   . PONV (postoperative nausea and vomiting)    with c-section x 1    Past Surgical History:  Procedure Laterality Date  . ABDOMINAL HYSTERECTOMY    . ABLATION    . BREAST CYST ASPIRATION Left   . CESAREAN SECTION    . CESAREAN SECTION    . CESAREAN SECTION    . LAPAROSCOPY  2018  . LEEP    . TOTAL HIP ARTHROPLASTY Right 11/12/2018   Procedure: TOTAL HIP ARTHROPLASTY ANTERIOR APPROACH;  Surgeon: Lovell Sheehan, MD;  Location: [facility] ORS;  Service: Orthopedics;  Laterality: Right;  . TOTAL VAGINAL HYSTERECTOMY      There were no vitals filed for this visit.  Subjective Assessment - 12/03/18 1025    Subjective  Pt reports more of a burning sensation in her R hip today, but only a 3-4/10 pain; she reports that her HEP is going well.  No new questions or concerns at this time.    Patient is accompained by:  Family member    Pertinent History  Pt presents s/p right anterior total hip arthroplasty on 11/12/18.  She has completed 2 sessions of PT at another  facility, however, her treatment sessions were not one-on-one, and she was not getting the feedback she was looking for on her exercises.    Limitations  Sitting;Standing;Walking;Lifting;House hold activities    How long can you sit comfortably?  45 min    How long can you stand comfortably?  30 min    How long can you walk comfortably?  15 min    Patient Stated Goals  would like to be able to go back to the gym and have no pain; would like to be able to cook and clean again    Currently in Pain?  Yes    Pain Score  4     Pain Location  Hip    Pain Orientation  Right    Pain Descriptors / Indicators  Aching;Burning    Pain Type  Surgical pain    Pain Onset  1 to 4 weeks ago    Pain Frequency  Constant        TREATMENT   Ther-ex: Nustep 5 min L2 for warm up during history   L hip strength: Strength (out of 5) Left 4+/5Hip flexion 5/5 Hip ER in sitting 5/5Hip IR in sitting  4/5 Hip abduction (reports pain on R hip from sidelying position) 4/5 Hip extension 5/5 Knee extension 4/5 Knee flexion  L hip ROM:  AROM (degrees) R/L (all movements include overpressure unless otherwise stated)  Hip IR (0-45): L: 45 Hip ER (0-45): L: 40 Hip Flexion (0-125): 124 Hip Abduction (0-40): L: 40 Hip extension PROM (0-15): L: 25; R 8  R prone quad stretch 3x30s  R prone hip ext stretch  R leg press 45# 2x10  R calf raises 45# x10; 60# x20  STSs 2x10; 2nd set her LLE on airex pad   Seated knee extension x10 with 3# CW   Step up/downs onto 1st step with BUE support on handraills reports pain in inner thigh x10  SLRs 2x5 without weight  Supine bridging 2x10 with a 3s hold with LLE further forward and 4# CW on stomach      Pt educated throughout session about proper posture and technique with exercises. Improved exercise technique, movement at target joints, use of target muscles after min to mod verbal, visual, tactile cues.    Pt demonstrates excellent  motivation throughout today's session.  She demonstrates improvement with equal weight shift during STSs today, but still slightly favors her LLE.  She does display the ability to perform SLRs today without assistance, demonstrating good muscular control and technique.  Her LLE strength and ROM were assessed today, displaying the following: 4+/5hip flexion, 5/5 hip ER in sitting, 5/5hip IR in sitting, 4/5 hip abduction (reports pain on R hip from sidelying position), 4/5 hip extension, 5/5 knee extension, 4/5 knee flexion, hip IR 45 deg, hip ER 40 deg, hip flexion 124 deg, hip abduction 40 deg, and hip extension PROM L: 25 deg and R 8 deg.  Pt will benefit from skilled PT services to address deficits and return to pain-free function at home and work.                           PT Short Term Goals - 11/27/18 1028      PT SHORT TERM GOAL #1   Title  Pt will be independent with HEP in order to improve strength and decrease back pain in order to improve pain-free function at home and work.    Time  6    Period  Weeks    Status  New    Target Date  01/07/19        PT Long Term Goals - 12/03/18 1232      PT LONG TERM GOAL #1   Title  Pt will increase her LEFS to greater than 60/80 in order to demonstrate a clinically significant change in order to improve her ability to perform ADLs and IADLs with less difficulty and to improve her quality of life.    Baseline  11/26/18: 9/80    Time  12    Period  Weeks    Status  New    Target Date  02/18/19      PT LONG TERM GOAL #2   Title  Pt will improve her R hip ROM to within 10 degrees of her L hip ROM  in order to improve her ability to perform ADLs and IADLs with less difficulty and to improve her quality of life.    Baseline  11/26/18: 28 degrees of R hip IR, 12 degrees of R hip ER, 108 degrees of hip flexion, 20 deg of hip abduction, and -2 deg of hip extension;  12/03/18: L hip assessed today for comparison: L hip IR 45 deg, L  hip ER 40 deg, L hip flexion 124 deg, L hip abduction 40 deg, PROM form hip extension L 25 deg, R 8 deg    Time  12    Period  Weeks    Status  New    Target Date  02/18/19      PT LONG TERM GOAL #3   Title  Pt will improve her R hip strength to a 4/5 in order to improve her ability to perform ADLs and IADLs with less difficulty and to improve her quality of life.    Baseline  11/26/18: 3+/5 hip flexion, 2+/5 hip ER (limited by pain and ROM), 3+/5 on hip IR, 3+/5 on hip abduction, 2+/5 on hip extension, 5/5 on knee extension, and 4/5 on knee flexion; 12/03/18: L hip strength assesessed today for comparison: 4+/5 Hip flexion, 5/5 Hip ER in sitting, 5/5 Hip IR in sitting, 4/5 Hip abduction (reports pain on R hip from sidelying position), 4/5 Hip extension, 5/5 Knee extension, 4/5 Knee flexion    Time  12    Period  Weeks    Status  Revised    Target Date  02/18/19      PT LONG TERM GOAL #4   Title  Pt will decrease worst hip pain as reported on NPRS by at least 3 points in order to demonstrate clinically significant reduction in hip pain.    Baseline  11/23/18: 9/10    Time  12    Period  Weeks    Status  New    Target Date  02/18/19            Plan - 12/03/18 1116    Clinical Impression Statement  Pt demonstrates excellent motivation throughout today's session.  She demonstrates improvement with equal weight shift during STSs today, but still slightly favors her LLE.  She does display the ability to perform SLRs today without assistance, demonstrating good muscular control and technique.  Her LLE strength and ROM were assessed today, displaying the following: 4+/5 hip flexion, 5/5 hip ER in sitting, 5/5 hip IR in sitting, 4/5 hip abduction (reports pain on R hip from sidelying position), 4/5 hip extension, 5/5 knee extension, 4/5 knee flexion, hip IR 45 deg, hip ER 40 deg, hip flexion 124 deg, hip abduction 40 deg, and hip extension PROM L: 25 deg and R 8 deg.  Pt will benefit from  skilled PT services to address deficits and return to pain-free function at home and work.    Personal Factors and Comorbidities  Comorbidity 3+    Comorbidities  DDD, migraine, IBS,    Examination-Activity Limitations  Bend;Lift;Squat;Caring for Others;Locomotion Level;Stairs;Carry;Stand    Examination-Participation Restrictions  Interpersonal Relationship;Yard Work;Cleaning;Laundry;Community Activity;Shop;Driving;Meal Prep    Stability/Clinical Decision Making  Stable/Uncomplicated    Rehab Potential  Good    PT Frequency  2x / week    PT Duration  12 weeks    PT Treatment/Interventions  Cryotherapy;Electrical Stimulation;Iontophoresis 4mg /ml Dexamethasone;Moist Heat;Ultrasound;DME Instruction;Gait training;Stair training;Functional mobility training;Therapeutic activities;Therapeutic exercise;Balance training;Neuromuscular re-education;Patient/family education;Manual techniques;Scar mobilization;Passive range of motion;Dry needling;Taping;Joint Manipulations;ADLs/Self Care Home Management;Aquatic Therapy;Canalith Repostioning;Vestibular;Spinal Manipulations    PT Next Visit Plan  progress strength and ROM per protocol    PT Home Exercise Plan  develop HEP; add standing hip flexor stretch    Consulted and Agree with Plan of Care  Patient;Family member/caregiver       Patient will benefit from skilled therapeutic intervention  in order to improve the following deficits and impairments:  Decreased balance, Decreased endurance, Decreased range of motion, Pain, Decreased strength, Abnormal gait  Visit Diagnosis: Pain in right hip  Muscle weakness (generalized)     Problem List Patient Active Problem List   Diagnosis Date Noted  . Osteonecrosis of right hip ([place]) 11/12/2018  . Vitamin D deficiency 02/25/2018  . Anemia, unspecified 01/08/2017  . Chronic pain 10/24/2015  . Hot flashes 10/24/2015  . Mood changes 10/24/2015  . Migraine without aura and without status migrainosus, not  intractable 10/24/2015  . Metabolic syndrome 123XX123  . Dyslipidemia 10/24/2015  . Allergic rhinitis, seasonal 05/17/2015  . Asthma, mild intermittent 08/08/2014  . Chronic insomnia 08/08/2014  . DDD (degenerative disc disease), lumbar 08/08/2014  . Mood disorder ([place]) 08/08/2014  . Gastro-esophageal reflux disease without esophagitis 08/08/2014  . H/O total hysterectomy 08/08/2014  . Irritable bowel syndrome with diarrhea 08/08/2014  . Osteoarthritis of right hip 08/08/2014  . Adult BMI 30+ 08/08/2014  . SBO (spina bifida occulta) 08/08/2014    This entire session was performed under direct supervision and direction of a licensed therapist/therapist assistant . I have personally read, edited and approve of the note as written.   Lutricia Horsfall, SPT [place] PT, DPT, GCS  Huprich,Jason 12/03/2018, 5:21 PM  [place] MAIN [facility] SERVICES [address] [place], [place], [address] Phone: [phone]   Fax:  [phone]  Name: Shelby Henson MRN: [id number] Date of Birth: 11-24-1970

## 2018-12-08 ENCOUNTER — Other Ambulatory Visit: Payer: Self-pay

## 2018-12-08 ENCOUNTER — Ambulatory Visit: Payer: No Typology Code available for payment source

## 2018-12-08 DIAGNOSIS — M6281 Muscle weakness (generalized): Secondary | ICD-10-CM

## 2018-12-08 DIAGNOSIS — M25551 Pain in right hip: Secondary | ICD-10-CM | POA: Diagnosis not present

## 2018-12-08 NOTE — Patient Instructions (Signed)
Access Code: [email or web address]  URL: [email or web address]  Date: 12/08/2018  Prepared by: Roxana Hires   Exercises Clamshell with Resistance - 10 reps - 2 sets - 1x daily - 7x weekly Supine Straight Leg Raises - 10 reps - 2 sets - 1x daily - 7x weekly                  Sit to Stand with Arms Crossed - 10 reps - 2 sets - 1x daily - 7x weekly Supine Bridge - 10 reps - 2 sets - 1x daily - 7x weekly Standing Hip Flexor Stretch - 3 sets - 30 hold - 2x daily - 7x weekly ITB Stretch at Wall - 3 sets - 30 hold - 2x daily - 7x weekly Sidelying Hip Abduction - 10 reps - 2 sets - 1x daily - 7x weekly Single Leg Heel Raise with Chair Support - 20 reps - 2 sets - 1x daily - 7x weekly Seated Knee Extension with Resistance - 10 reps - 2 sets - 1x daily - 7x weekly

## 2018-12-08 NOTE — Therapy (Signed)
[place] MAIN [facility] SERVICES [address] [place], [place], [address] Phone: [phone]   Fax:  [phone]  Physical Therapy Treatment  Patient Details  Name: Shelby Henson MRN: [id number] Date of Birth: 1970/07/18 Referring Provider (PT): Dr. Harlow Mares   Encounter Date: 12/08/2018  PT End of Session - 12/08/18 1115    Visit Number  3    Number of Visits  25    Date for PT Re-Evaluation  02/18/19    PT Start Time  [id number]    PT Stop Time  1100    PT Time Calculation (min)  45 min    Activity Tolerance  Patient tolerated treatment well    Behavior During Therapy  [facility] for tasks assessed/performed       Past Medical History:  Diagnosis Date  . Anemia   . Asthma    well controlled  . Complication of anesthesia   . Depression   . GERD (gastroesophageal reflux disease)    occ  . Migraine   . Osteoarthritis   . PONV (postoperative nausea and vomiting)    with c-section x 1    Past Surgical History:  Procedure Laterality Date  . ABDOMINAL HYSTERECTOMY    . ABLATION    . BREAST CYST ASPIRATION Left   . CESAREAN SECTION    . CESAREAN SECTION    . CESAREAN SECTION    . LAPAROSCOPY  2018  . LEEP    . TOTAL HIP ARTHROPLASTY Right 11/12/2018   Procedure: TOTAL HIP ARTHROPLASTY ANTERIOR APPROACH;  Surgeon: Lovell Sheehan, MD;  Location: [facility] ORS;  Service: Orthopedics;  Laterality: Right;  . TOTAL VAGINAL HYSTERECTOMY      There were no vitals filed for this visit.  Subjective Assessment - 12/08/18 1019    Subjective  Pt reports that her hip feels stiff today, and is at a 4/10 pain today; she reports that her HEP is going well.  She has a f/u appt with her provider on the 24th.  No new questions or concerns at this time.    Patient is accompained by:  Family member    Pertinent History  Pt presents s/p right anterior total hip arthroplasty on 11/12/18.  She has completed 2 sessions of PT at another facility, however, her treatment  sessions were not one-on-one, and she was not getting the feedback she was looking for on her exercises.    Limitations  Sitting;Standing;Walking;Lifting;House hold activities    How long can you sit comfortably?  45 min    How long can you stand comfortably?  30 min    How long can you walk comfortably?  15 min    Patient Stated Goals  would like to be able to go back to the gym and have no pain; would like to be able to cook and clean again    Pain Score  4     Pain Location  Hip    Pain Orientation  Right    Pain Descriptors / Indicators  Aching;Tightness;Sore    Pain Onset  1 to 4 weeks ago       Treatment:  NuSTEP 35min at L1  R prone quad/hipflexor stretch 3x30s  R standing hip flexor stretch 3x30s  R standing IT band stretch 3x30s  R leg press 45# 2x15  R calf raises 60# 2x20  STSs 2x10 with LLE in front on 1/2 spiky ball  Seated knee extension 2x10 with 3# CW   SLRs 2x10  without weight  Sidelying hip abduction 2x10 without weight  Clam shells with red band 2x10  Supine bridging 2x10 with a 3s hold with LLE further forward and 4# CW on stomach      Pt educated throughout session about proper posture and technique with exercises. Improved exercise technique, movement at target joints, use of target muscles after min to mod verbal, visual, tactile cues.    Pt demonstrates excellent motivation throughout today's session.  She continues to progress with her strength and endurance each session, while maintaining proper form.  Her HEP was updated today, and exercises were reviewed with good understanding.  She reports that she felt less stiff at the end of the session, stating that she really liked the standing IT band and standing hip flexor stretches.   Pt will benefit from skilled PT services to address deficits and return to pain-free function at home and work.                             PT Short Term Goals - 11/27/18  1028      PT SHORT TERM GOAL #1   Title  Pt will be independent with HEP in order to improve strength and decrease back pain in order to improve pain-free function at home and work.    Time  6    Period  Weeks    Status  New    Target Date  01/07/19        PT Long Term Goals - 12/03/18 1232      PT LONG TERM GOAL #1   Title  Pt will increase her LEFS to greater than 60/80 in order to demonstrate a clinically significant change in order to improve her ability to perform ADLs and IADLs with less difficulty and to improve her quality of life.    Baseline  11/26/18: 9/80    Time  12    Period  Weeks    Status  New    Target Date  02/18/19      PT LONG TERM GOAL #2   Title  Pt will improve her R hip ROM to within 10 degrees of her L hip ROM  in order to improve her ability to perform ADLs and IADLs with less difficulty and to improve her quality of life.    Baseline  11/26/18: 28 degrees of R hip IR, 12 degrees of R hip ER, 108 degrees of hip flexion, 20 deg of hip abduction, and -2 deg of hip extension; 12/03/18: L hip assessed today for comparison: L hip IR 45 deg, L hip ER 40 deg, L hip flexion 124 deg, L hip abduction 40 deg, PROM form hip extension L 25 deg, R 8 deg    Time  12    Period  Weeks    Status  New    Target Date  02/18/19      PT LONG TERM GOAL #3   Title  Pt will improve her R hip strength to a 4/5 in order to improve her ability to perform ADLs and IADLs with less difficulty and to improve her quality of life.    Baseline  11/26/18: 3+/5 hip flexion, 2+/5 hip ER (limited by pain and ROM), 3+/5 on hip IR, 3+/5 on hip abduction, 2+/5 on hip extension, 5/5 on knee extension, and 4/5 on knee flexion; 12/03/18: L hip strength assesessed today for comparison: 4+/5 Hip flexion, 5/5 Hip ER in  sitting, 5/5 Hip IR in sitting, 4/5 Hip abduction (reports pain on R hip from sidelying position), 4/5 Hip extension, 5/5 Knee extension, 4/5 Knee flexion    Time  12    Period  Weeks     Status  Revised    Target Date  02/18/19      PT LONG TERM GOAL #4   Title  Pt will decrease worst hip pain as reported on NPRS by at least 3 points in order to demonstrate clinically significant reduction in hip pain.    Baseline  11/23/18: 9/10    Time  12    Period  Weeks    Status  New    Target Date  02/18/19            Plan - 12/08/18 1114    Clinical Impression Statement  Pt demonstrates excellent motivation throughout today's session.  She continues to progress with her strength and endurance each session, while maintaining proper form.  Her HEP was updated today, and exercises were reviewed with good understanding.  She reports that she felt less stiff at the end of the session, stating that she really liked the standing IT band and standing hip flexor stretches.   Pt will benefit from skilled PT services to address deficits and return to pain-free function at home and work.    Personal Factors and Comorbidities  Comorbidity 3+    Comorbidities  DDD, migraine, IBS,    Examination-Activity Limitations  Bend;Lift;Squat;Caring for Others;Locomotion Level;Stairs;Carry;Stand    Examination-Participation Restrictions  Interpersonal Relationship;Yard Work;Cleaning;Laundry;Community Activity;Shop;Driving;Meal Prep    Stability/Clinical Decision Making  Stable/Uncomplicated    Rehab Potential  Good    PT Frequency  2x / week    PT Duration  12 weeks    PT Treatment/Interventions  Cryotherapy;Electrical Stimulation;Iontophoresis 4mg /ml Dexamethasone;Moist Heat;Ultrasound;DME Instruction;Gait training;Stair training;Functional mobility training;Therapeutic activities;Therapeutic exercise;Balance training;Neuromuscular re-education;Patient/family education;Manual techniques;Scar mobilization;Passive range of motion;Dry needling;Taping;Joint Manipulations;ADLs/Self Care Home Management;Aquatic Therapy;Canalith Repostioning;Vestibular;Spinal Manipulations    PT Next Visit Plan  progress  strength and ROM per protocol    PT Home Exercise Plan  [email or web address]    Consulted and Agree with Plan of Care  Patient       Patient will benefit from skilled therapeutic intervention in order to improve the following deficits and impairments:  Decreased balance, Decreased endurance, Decreased range of motion, Pain, Decreased strength, Abnormal gait  Visit Diagnosis: Pain in right hip  Muscle weakness (generalized)     Problem List Patient Active Problem List   Diagnosis Date Noted  . Osteonecrosis of right hip ([place]) 11/12/2018  . Vitamin D deficiency 02/25/2018  . Anemia, unspecified 01/08/2017  . Chronic pain 10/24/2015  . Hot flashes 10/24/2015  . Mood changes 10/24/2015  . Migraine without aura and without status migrainosus, not intractable 10/24/2015  . Metabolic syndrome 123XX123  . Dyslipidemia 10/24/2015  . Allergic rhinitis, seasonal 05/17/2015  . Asthma, mild intermittent 08/08/2014  . Chronic insomnia 08/08/2014  . DDD (degenerative disc disease), lumbar 08/08/2014  . Mood disorder ([place]) 08/08/2014  . Gastro-esophageal reflux disease without esophagitis 08/08/2014  . H/O total hysterectomy 08/08/2014  . Irritable bowel syndrome with diarrhea 08/08/2014  . Osteoarthritis of right hip 08/08/2014  . Adult BMI 30+ 08/08/2014  . SBO (spina bifida occulta) 08/08/2014    This entire session was performed under direct supervision and direction of a licensed therapist/therapist assistant . I have personally read, edited and approve of the note as written.   Lutricia Horsfall, SPT [place]  PT, DPT, GCS  Huprich,Jason 12/08/2018, 2:07 PM  [place] MAIN [facility] SERVICES [address] [place], [place], [address] Phone: [phone]   Fax:  [phone]  Name: Shelby Henson MRN: [id number] Date of Birth: 1970-06-13

## 2018-12-10 ENCOUNTER — Ambulatory Visit: Payer: No Typology Code available for payment source

## 2018-12-10 ENCOUNTER — Other Ambulatory Visit: Payer: Self-pay

## 2018-12-10 DIAGNOSIS — M6281 Muscle weakness (generalized): Secondary | ICD-10-CM

## 2018-12-10 DIAGNOSIS — M25551 Pain in right hip: Secondary | ICD-10-CM

## 2018-12-10 NOTE — Therapy (Signed)
[place] MAIN [facility] SERVICES [address] [place], [place], [address] Phone: [phone]   Fax:  [phone]  Physical Therapy Treatment  Patient Details  Name: Shelby Henson MRN: [id number] Date of Birth: September 24, 1970 Referring Provider (PT): Dr. Harlow Mares   Encounter Date: 12/10/2018  PT End of Session - 12/10/18 0844    Visit Number  4    Number of Visits  25    Date for PT Re-Evaluation  02/18/19    PT Start Time  0845    PT Stop Time  0950    PT Time Calculation (min)  65 min    Activity Tolerance  Patient tolerated treatment well    Behavior During Therapy  [facility] for tasks assessed/performed       Past Medical History:  Diagnosis Date  . Anemia   . Asthma    well controlled  . Complication of anesthesia   . Depression   . GERD (gastroesophageal reflux disease)    occ  . Migraine   . Osteoarthritis   . PONV (postoperative nausea and vomiting)    with c-section x 1    Past Surgical History:  Procedure Laterality Date  . ABDOMINAL HYSTERECTOMY    . ABLATION    . BREAST CYST ASPIRATION Left   . CESAREAN SECTION    . CESAREAN SECTION    . CESAREAN SECTION    . LAPAROSCOPY  2018  . LEEP    . TOTAL HIP ARTHROPLASTY Right 11/12/2018   Procedure: TOTAL HIP ARTHROPLASTY ANTERIOR APPROACH;  Surgeon: Lovell Sheehan, MD;  Location: [facility] ORS;  Service: Orthopedics;  Laterality: Right;  . TOTAL VAGINAL HYSTERECTOMY      There were no vitals filed for this visit.  Subjective Assessment - 12/10/18 0852    Subjective  Pt reports an anteromedial hip pain this morning of a 5/10.  She reports that she wasn't sore after last session, but that she was sore and painful today, potentially due to the weather.  No new questions or concerns    Patient is accompained by:  Family member    Pertinent History  Pt presents s/p right anterior total hip arthroplasty on 11/12/18.  She has completed 2 sessions of PT at another facility, however, her  treatment sessions were not one-on-one, and she was not getting the feedback she was looking for on her exercises.    Limitations  Sitting;Standing;Walking;Lifting;House hold activities    How long can you sit comfortably?  45 min    How long can you stand comfortably?  30 min    How long can you walk comfortably?  15 min    Patient Stated Goals  would like to be able to go back to the gym and have no pain; would like to be able to cook and clean again    Currently in Pain?  Yes    Pain Score  5     Pain Location  Hip    Pain Orientation  Right    Pain Descriptors / Indicators  Aching;Tightness;Sore    Pain Type  Surgical pain    Pain Onset  1 to 4 weeks ago       Treatment:  Therapeutic Exercise R prone quad/hipflexor stretch with slight abduction 4x30s  Sidelying hip abduction 2x10 without weight  Clam shells with red band 2x15  Seated knee extension 2x15 with 3# CW  SLRs 2x12 without weight  Supine bridging 2x10 with a 3s hold with LLE  further forward   STSs 2x12 with LLE in front on 1/2 spiky ball  R standing hip flexor stretch 3x30s  R leg press 45# x15; 60# x10  R calf raises 60# 2x20    Manual Therapy Supine hookylying inferior grade I-II hip mobs with 3 bouts of 45s   E-stim, unbilled E-stim (unattended) with 2 channels of premod setting on the [employer].  Frequency = 80/150 Hz at 53mA over the anteromedial R proximal thigh and 32mA over the lateral R proximal leg for 20 min with MHP.  She reports a 2/10 upon departure.     Pt educated throughout session about proper posture and technique with exercises. Improved exercise technique, movement at target joints, use of target muscles after min to mod verbal, visual, tactile cues.    Pt demonstrates excellent motivation throughout today's session. She continues to demonstrate good technique and control during all exercises.  She displays better weight shift during STSs today with her  LLE on top of a 1/2 ball to promote increased weight shift to the RLE.  She responded well to the initiation of grade I-II inferior hip joint mobs today, reporting a decrease in muscle tension and pain.  She reports a 2/10 pain at the end of her session, following e-stim. Pt will benefit from skilled PT services to address deficits and return to pain-free function at home and work.                          PT Short Term Goals - 11/27/18 1028      PT SHORT TERM GOAL #1   Title  Pt will be independent with HEP in order to improve strength and decrease back pain in order to improve pain-free function at home and work.    Time  6    Period  Weeks    Status  New    Target Date  01/07/19        PT Long Term Goals - 12/03/18 1232      PT LONG TERM GOAL #1   Title  Pt will increase her LEFS to greater than 60/80 in order to demonstrate a clinically significant change in order to improve her ability to perform ADLs and IADLs with less difficulty and to improve her quality of life.    Baseline  11/26/18: 9/80    Time  12    Period  Weeks    Status  New    Target Date  02/18/19      PT LONG TERM GOAL #2   Title  Pt will improve her R hip ROM to within 10 degrees of her L hip ROM  in order to improve her ability to perform ADLs and IADLs with less difficulty and to improve her quality of life.    Baseline  11/26/18: 28 degrees of R hip IR, 12 degrees of R hip ER, 108 degrees of hip flexion, 20 deg of hip abduction, and -2 deg of hip extension; 12/03/18: L hip assessed today for comparison: L hip IR 45 deg, L hip ER 40 deg, L hip flexion 124 deg, L hip abduction 40 deg, PROM form hip extension L 25 deg, R 8 deg    Time  12    Period  Weeks    Status  New    Target Date  02/18/19      PT LONG TERM GOAL #3   Title  Pt will improve her R  hip strength to a 4/5 in order to improve her ability to perform ADLs and IADLs with less difficulty and to improve her quality of life.     Baseline  11/26/18: 3+/5 hip flexion, 2+/5 hip ER (limited by pain and ROM), 3+/5 on hip IR, 3+/5 on hip abduction, 2+/5 on hip extension, 5/5 on knee extension, and 4/5 on knee flexion; 12/03/18: L hip strength assesessed today for comparison: 4+/5 Hip flexion, 5/5 Hip ER in sitting, 5/5 Hip IR in sitting, 4/5 Hip abduction (reports pain on R hip from sidelying position), 4/5 Hip extension, 5/5 Knee extension, 4/5 Knee flexion    Time  12    Period  Weeks    Status  Revised    Target Date  02/18/19      PT LONG TERM GOAL #4   Title  Pt will decrease worst hip pain as reported on NPRS by at least 3 points in order to demonstrate clinically significant reduction in hip pain.    Baseline  11/23/18: 9/10    Time  12    Period  Weeks    Status  New    Target Date  02/18/19            Plan - 12/10/18 1157    Clinical Impression Statement  Pt demonstrates excellent motivation throughout today's session.  She continues to demonstrate good technique and control during all exercises.  She displays better weight shift during STSs today with her LLE on top of a 1/2 ball to promote increased weight shift to the RLE.  She responded well to the initiation of grade I-II inferior hip joint mobs today, reporting a decrease in muscle tension and pain.  She reports a 2/10 pain at the end of her session, following e-stim.  Pt will benefit from skilled PT services to address deficits and return to pain-free function at home and work.    Personal Factors and Comorbidities  Comorbidity 3+    Comorbidities  DDD, migraine, IBS,    Examination-Activity Limitations  Bend;Lift;Squat;Caring for Others;Locomotion Level;Stairs;Carry;Stand    Examination-Participation Restrictions  Interpersonal Relationship;Yard Work;Cleaning;Laundry;Community Activity;Shop;Driving;Meal Prep    Stability/Clinical Decision Making  Stable/Uncomplicated    Rehab Potential  Good    PT Frequency  2x / week    PT Duration  12 weeks     PT Treatment/Interventions  Cryotherapy;Electrical Stimulation;Iontophoresis 4mg /ml Dexamethasone;Moist Heat;Ultrasound;DME Instruction;Gait training;Stair training;Functional mobility training;Therapeutic activities;Therapeutic exercise;Balance training;Neuromuscular re-education;Patient/family education;Manual techniques;Scar mobilization;Passive range of motion;Dry needling;Taping;Joint Manipulations;ADLs/Self Care Home Management;Aquatic Therapy;Canalith Repostioning;Vestibular;Spinal Manipulations    PT Next Visit Plan  progress strength and ROM per protocol    PT Home Exercise Plan  [email or web address]    Consulted and Agree with Plan of Care  Patient       Patient will benefit from skilled therapeutic intervention in order to improve the following deficits and impairments:  Decreased balance, Decreased endurance, Decreased range of motion, Pain, Decreased strength, Abnormal gait  Visit Diagnosis: Pain in right hip  Muscle weakness (generalized)     Problem List Patient Active Problem List   Diagnosis Date Noted  . Osteonecrosis of right hip ([place]) 11/12/2018  . Vitamin D deficiency 02/25/2018  . Anemia, unspecified 01/08/2017  . Chronic pain 10/24/2015  . Hot flashes 10/24/2015  . Mood changes 10/24/2015  . Migraine without aura and without status migrainosus, not intractable 10/24/2015  . Metabolic syndrome 123XX123  . Dyslipidemia 10/24/2015  . Allergic rhinitis, seasonal 05/17/2015  . Asthma, mild intermittent 08/08/2014  . Chronic insomnia  08/08/2014  . DDD (degenerative disc disease), lumbar 08/08/2014  . Mood disorder ([place]) 08/08/2014  . Gastro-esophageal reflux disease without esophagitis 08/08/2014  . H/O total hysterectomy 08/08/2014  . Irritable bowel syndrome with diarrhea 08/08/2014  . Osteoarthritis of right hip 08/08/2014  . Adult BMI 30+ 08/08/2014  . SBO (spina bifida occulta) 08/08/2014    This entire session was performed under direct supervision and direction  of a licensed therapist/therapist assistant . I have personally read, edited and approve of the note as written.   Lutricia Horsfall, SPT [facility] PT, DPT, GCS  Huprich,Jason 12/10/2018, 1:26 PM  [place] MAIN [facility] SERVICES [address] [place], [place], [address] Phone: [phone]   Fax:  [phone]  Name: Shelby Henson MRN: [id number] Date of Birth: 1970/11/09

## 2018-12-15 ENCOUNTER — Other Ambulatory Visit: Payer: Self-pay

## 2018-12-15 ENCOUNTER — Ambulatory Visit: Payer: No Typology Code available for payment source

## 2018-12-15 DIAGNOSIS — M6281 Muscle weakness (generalized): Secondary | ICD-10-CM

## 2018-12-15 DIAGNOSIS — M25551 Pain in right hip: Secondary | ICD-10-CM

## 2018-12-15 NOTE — Therapy (Signed)
[place] MAIN [facility] SERVICES [address] [place], [place], [address] Phone: [phone]   Fax:  [phone]  Physical Therapy Treatment  Patient Details  Name: RAIGYN JAROSINSKI MRN: [id number] Date of Birth: 1970-10-08 Referring Provider (PT): Dr. Harlow Mares   Encounter Date: 12/15/2018  PT End of Session - 12/15/18 1041    Visit Number  5    Number of Visits  25    Date for PT Re-Evaluation  02/18/19    PT Start Time  [id number]    PT Stop Time  1100    PT Time Calculation (min)  45 min    Activity Tolerance  Patient tolerated treatment well    Behavior During Therapy  [facility] for tasks assessed/performed       Past Medical History:  Diagnosis Date  . Anemia   . Asthma    well controlled  . Complication of anesthesia   . Depression   . GERD (gastroesophageal reflux disease)    occ  . Migraine   . Osteoarthritis   . PONV (postoperative nausea and vomiting)    with c-section x 1    Past Surgical History:  Procedure Laterality Date  . ABDOMINAL HYSTERECTOMY    . ABLATION    . BREAST CYST ASPIRATION Left   . CESAREAN SECTION    . CESAREAN SECTION    . CESAREAN SECTION    . LAPAROSCOPY  2018  . LEEP    . TOTAL HIP ARTHROPLASTY Right 11/12/2018   Procedure: TOTAL HIP ARTHROPLASTY ANTERIOR APPROACH;  Surgeon: Lovell Sheehan, MD;  Location: [facility] ORS;  Service: Orthopedics;  Laterality: Right;  . TOTAL VAGINAL HYSTERECTOMY      There were no vitals filed for this visit.  Subjective Assessment - 12/15/18 1020    Subjective  Pt reports an anteromedial hip pain this morning of a 5/10.  She reports that she wore sandals on Thursday and slipped, but caught herself before falling, but states that she feels like she jarred her anterormedial hip slightly, increasing her pain.  She has a f/u appt on Nov 24th.  No new questions or concerns    Patient is accompained by:  Family member    Pertinent History  Pt presents s/p right anterior total hip  arthroplasty on 11/12/18.  She has completed 2 sessions of PT at another facility, however, her treatment sessions were not one-on-one, and she was not getting the feedback she was looking for on her exercises.    Limitations  Sitting;Standing;Walking;Lifting;House hold activities    How long can you sit comfortably?  45 min    How long can you stand comfortably?  30 min    How long can you walk comfortably?  15 min    Patient Stated Goals  would like to be able to go back to the gym and have no pain; would like to be able to cook and clean again    Currently in Pain?  Yes    Pain Score  5     Pain Location  Hip    Pain Orientation  Right    Pain Descriptors / Indicators  Aching;Tightness;Sore    Pain Type  Surgical pain    Pain Onset  More than a month ago    Pain Frequency  Constant         Treatment:  Therapeutic Exercise NuStep 62min L0  R standing hip flexor stretch with slight toe ER 3x30  Sidelying hip abduction 2x15  without weight  Clam shells with green band 2x15  Supine bridging 2x10 with a 3s hold with LLE further forward   Seated knee extension2x15 with 4# CW  SLRs x10without weight; painful today   STSs 2x10 withLLEin front on 1/2 spiky ball  R leg press  60# 2x12  R calf raises 60#2x25    Manual Therapy Supine hookylying inferior grade I-II hip mobs with 3 bouts of 45s  R prone quad/hipflexorstretch with slight abduction 4x30s   Pt educated throughout session about proper posture and technique with exercises. Improved exercise technique, movement at target joints, use of target muscles after min to mod verbal, visual, tactile cues.    Pt demonstrates excellent motivation throughout today's session.  She reports increased irritation in her anteromedial R thigh today, stating that she jarred it when she stepped wrong on Thursday.  She reports a relief in muscle tension and pain following inferior hip glides along with hip  flexor and quad stretching.  She demonstrates equal weight shift bilaterally with STSs today.  She continues to demonstrate good technique and control during all exercises, responding well to the progression of strength exercises.Pt will benefit from skilled PT services to address deficits and return to pain-free function at home and work.                          PT Short Term Goals - 11/27/18 1028      PT SHORT TERM GOAL #1   Title  Pt will be independent with HEP in order to improve strength and decrease back pain in order to improve pain-free function at home and work.    Time  6    Period  Weeks    Status  New    Target Date  01/07/19        PT Long Term Goals - 12/03/18 1232      PT LONG TERM GOAL #1   Title  Pt will increase her LEFS to greater than 60/80 in order to demonstrate a clinically significant change in order to improve her ability to perform ADLs and IADLs with less difficulty and to improve her quality of life.    Baseline  11/26/18: 9/80    Time  12    Period  Weeks    Status  New    Target Date  02/18/19      PT LONG TERM GOAL #2   Title  Pt will improve her R hip ROM to within 10 degrees of her L hip ROM  in order to improve her ability to perform ADLs and IADLs with less difficulty and to improve her quality of life.    Baseline  11/26/18: 28 degrees of R hip IR, 12 degrees of R hip ER, 108 degrees of hip flexion, 20 deg of hip abduction, and -2 deg of hip extension; 12/03/18: L hip assessed today for comparison: L hip IR 45 deg, L hip ER 40 deg, L hip flexion 124 deg, L hip abduction 40 deg, PROM form hip extension L 25 deg, R 8 deg    Time  12    Period  Weeks    Status  New    Target Date  02/18/19      PT LONG TERM GOAL #3   Title  Pt will improve her R hip strength to a 4/5 in order to improve her ability to perform ADLs and IADLs with less difficulty and to improve her  quality of life.    Baseline  11/26/18: 3+/5 hip flexion,  2+/5 hip ER (limited by pain and ROM), 3+/5 on hip IR, 3+/5 on hip abduction, 2+/5 on hip extension, 5/5 on knee extension, and 4/5 on knee flexion; 12/03/18: L hip strength assesessed today for comparison: 4+/5 Hip flexion, 5/5 Hip ER in sitting, 5/5 Hip IR in sitting, 4/5 Hip abduction (reports pain on R hip from sidelying position), 4/5 Hip extension, 5/5 Knee extension, 4/5 Knee flexion    Time  12    Period  Weeks    Status  Revised    Target Date  02/18/19      PT LONG TERM GOAL #4   Title  Pt will decrease worst hip pain as reported on NPRS by at least 3 points in order to demonstrate clinically significant reduction in hip pain.    Baseline  11/23/18: 9/10    Time  12    Period  Weeks    Status  New    Target Date  02/18/19            Plan - 12/15/18 1245    Clinical Impression Statement  Pt demonstrates excellent motivation throughout today's session.  She reports increased irritation in her anteromedial R thigh today, stating that she jarred it when she stepped wrong on Thursday.  She reports a relief in muscle tension and pain following inferior hip glides along with hip flexor and quad stretching.  She demonstrates equal weight shift bilaterally with STSs today.  She continues to demonstrate good technique and control during all exercises, responding well to the progression of strength exercises. Pt will benefit from skilled PT services to address deficits and return to pain-free function at home and work.    Personal Factors and Comorbidities  Comorbidity 3+    Comorbidities  DDD, migraine, IBS,    Examination-Activity Limitations  Bend;Lift;Squat;Caring for Others;Locomotion Level;Stairs;Carry;Stand    Examination-Participation Restrictions  Interpersonal Relationship;Yard Work;Cleaning;Laundry;Community Activity;Shop;Driving;Meal Prep    Stability/Clinical Decision Making  Stable/Uncomplicated    Rehab Potential  Good    PT Frequency  2x / week    PT Duration  12 weeks     PT Treatment/Interventions  Cryotherapy;Electrical Stimulation;Iontophoresis 4mg /ml Dexamethasone;Moist Heat;Ultrasound;DME Instruction;Gait training;Stair training;Functional mobility training;Therapeutic activities;Therapeutic exercise;Balance training;Neuromuscular re-education;Patient/family education;Manual techniques;Scar mobilization;Passive range of motion;Dry needling;Taping;Joint Manipulations;ADLs/Self Care Home Management;Aquatic Therapy;Canalith Repostioning;Vestibular;Spinal Manipulations    PT Next Visit Plan  progress strength and ROM per protocol    PT Home Exercise Plan  [email or web address]    Consulted and Agree with Plan of Care  Patient       Patient will benefit from skilled therapeutic intervention in order to improve the following deficits and impairments:  Decreased balance, Decreased endurance, Decreased range of motion, Pain, Decreased strength, Abnormal gait  Visit Diagnosis: Pain in right hip  Muscle weakness (generalized)     Problem List Patient Active Problem List   Diagnosis Date Noted  . Osteonecrosis of right hip ([place]) 11/12/2018  . Vitamin D deficiency 02/25/2018  . Anemia, unspecified 01/08/2017  . Chronic pain 10/24/2015  . Hot flashes 10/24/2015  . Mood changes 10/24/2015  . Migraine without aura and without status migrainosus, not intractable 10/24/2015  . Metabolic syndrome 123XX123  . Dyslipidemia 10/24/2015  . Allergic rhinitis, seasonal 05/17/2015  . Asthma, mild intermittent 08/08/2014  . Chronic insomnia 08/08/2014  . DDD (degenerative disc disease), lumbar 08/08/2014  . Mood disorder ([place]) 08/08/2014  . Gastro-esophageal reflux disease without esophagitis 08/08/2014  . H/O  total hysterectomy 08/08/2014  . Irritable bowel syndrome with diarrhea 08/08/2014  . Osteoarthritis of right hip 08/08/2014  . Adult BMI 30+ 08/08/2014  . SBO (spina bifida occulta) 08/08/2014    This entire session was performed under direct supervision and direction  of a licensed therapist/therapist assistant . I have personally read, edited and approve of the note as written.   Lutricia Horsfall, SPT [facility] PT, DPT, GCS  Huprich,Jason 12/16/2018, 10:58 AM  [place] MAIN [facility] SERVICES [address] [place], [place], [address] Phone: [phone]   Fax:  [phone]  Name: BREKLYNN WOLLAM MRN: [id number] Date of Birth: 05-05-1970

## 2018-12-17 ENCOUNTER — Ambulatory Visit: Payer: No Typology Code available for payment source

## 2018-12-17 ENCOUNTER — Other Ambulatory Visit: Payer: Self-pay

## 2018-12-17 DIAGNOSIS — M6281 Muscle weakness (generalized): Secondary | ICD-10-CM

## 2018-12-17 DIAGNOSIS — M25551 Pain in right hip: Secondary | ICD-10-CM

## 2018-12-17 NOTE — Therapy (Signed)
[place] MAIN [facility] SERVICES [address] [place], [place], [address] Phone: [phone]   Fax:  [phone]  Physical Therapy Treatment  Patient Details  Name: Shelby Henson MRN: [id number] Date of Birth: [birth year] Referring Provider (PT): Dr. Harlow Mares   Encounter Date: 12/17/2018  PT End of Session - 12/17/18 1058    Visit Number  6    Number of Visits  25    Date for PT Re-Evaluation  02/18/19    PT Start Time  1030    PT Stop Time  1115    PT Time Calculation (min)  45 min    Activity Tolerance  Patient tolerated treatment well    Behavior During Therapy  [facility] for tasks assessed/performed       Past Medical History:  Diagnosis Date  . Anemia   . Asthma    well controlled  . Complication of anesthesia   . Depression   . GERD (gastroesophageal reflux disease)    occ  . Migraine   . Osteoarthritis   . PONV (postoperative nausea and vomiting)    with c-section x 1    Past Surgical History:  Procedure Laterality Date  . ABDOMINAL HYSTERECTOMY    . ABLATION    . BREAST CYST ASPIRATION Left   . CESAREAN SECTION    . CESAREAN SECTION    . CESAREAN SECTION    . LAPAROSCOPY  2018  . LEEP    . TOTAL HIP ARTHROPLASTY Right 11/12/2018   Procedure: TOTAL HIP ARTHROPLASTY ANTERIOR APPROACH;  Surgeon: Lovell Sheehan, MD;  Location: [facility] ORS;  Service: Orthopedics;  Laterality: Right;  . TOTAL VAGINAL HYSTERECTOMY      There were no vitals filed for this visit.  Subjective Assessment - 12/17/18 1035    Subjective  Pt reports an anteromedial hip pain this morning of a 4/10.  She reports that her HEP is going well.  She has a f/u appt on Nov 24th.  No new questions or concerns    Patient is accompained by:  Family member    Pertinent History  Pt presents s/p right anterior total hip arthroplasty on 11/12/18.  She has completed 2 sessions of PT at another facility, however, her treatment sessions were not one-on-one, and she was not  getting the feedback she was looking for on her exercises.    Limitations  Sitting;Standing;Walking;Lifting;House hold activities    How long can you sit comfortably?  45 min    How long can you stand comfortably?  30 min    How long can you walk comfortably?  15 min    Patient Stated Goals  would like to be able to go back to the gym and have no pain; would like to be able to cook and clean again    Currently in Pain?  Yes    Pain Score  4     Pain Location  Hip    Pain Orientation  Right    Pain Descriptors / Indicators  Aching;Tightness;Sore    Pain Type  Surgical pain    Pain Onset  More than a month ago    Pain Frequency  Constant        reatment:  Therapeutic Exercise NuStep 25min L2  Sidelying hip abduction 2x10 with 1# CW  Clam shells with green band 2x15  Supine bridging 2x15 with 3 sec hold with LLE further forward   SLRs 2x10without weight; continues to feel pain in her anteromedial  thigh  Hooklying hip adductor ball squeezes 2x15 with a 3 sec hold  Seated knee extension2x10with 5# CW  STSs 2x10withLLEin front on dynadisc  R leg press  65# 2x10  R calf raises 65#2x25    Manual Therapy Supine hookylying inferior grade I-II hip mobs with 3 bouts of 45s  R prone quad/hipflexorstretchwith slight abduction 4x30s   E-stim, unbilled E-stim (unattended) with 2 channels of premod setting on the [employer].  Frequency = 80/150 Hz at 79mA over the anteromedial R proximal thigh and 6mA over the lateral R proximal leg for 15 min with ice pack.  She reports a 4/10 upon departure.    Pt educated throughout session about proper posture and technique with exercises. Improved exercise technique, movement at target joints, use of target muscles after min to mod verbal, visual, tactile cues.    Pt demonstrates excellent motivation throughout today's session.  She continues to respond well to an increase in resistance and weight on  strength exercises, responding well to increased challenges incorporating increased weight shift onto the RLE.  She reports a relief in muscle tension and pain following inferior hip glides along with hip flexor and quad stretching, and reports a 4/10 pain at the end of the session following e-stim and ice.Pt will benefit from skilled PT services to address deficits and return to pain-free function at home and work.                         PT Short Term Goals - 11/27/18 1028      PT SHORT TERM GOAL #1   Title  Pt will be independent with HEP in order to improve strength and decrease back pain in order to improve pain-free function at home and work.    Time  6    Period  Weeks    Status  New    Target Date  01/07/19        PT Long Term Goals - 12/03/18 1232      PT LONG TERM GOAL #1   Title  Pt will increase her LEFS to greater than 60/80 in order to demonstrate a clinically significant change in order to improve her ability to perform ADLs and IADLs with less difficulty and to improve her quality of life.    Baseline  11/26/18: 9/80    Time  12    Period  Weeks    Status  New    Target Date  02/18/19      PT LONG TERM GOAL #2   Title  Pt will improve her R hip ROM to within 10 degrees of her L hip ROM  in order to improve her ability to perform ADLs and IADLs with less difficulty and to improve her quality of life.    Baseline  11/26/18: 28 degrees of R hip IR, 12 degrees of R hip ER, 108 degrees of hip flexion, 20 deg of hip abduction, and -2 deg of hip extension; 12/03/18: L hip assessed today for comparison: L hip IR 45 deg, L hip ER 40 deg, L hip flexion 124 deg, L hip abduction 40 deg, PROM form hip extension L 25 deg, R 8 deg    Time  12    Period  Weeks    Status  New    Target Date  02/18/19      PT LONG TERM GOAL #3   Title  Pt will improve her R hip strength to  a 4/5 in order to improve her ability to perform ADLs and IADLs with less difficulty and  to improve her quality of life.    Baseline  11/26/18: 3+/5 hip flexion, 2+/5 hip ER (limited by pain and ROM), 3+/5 on hip IR, 3+/5 on hip abduction, 2+/5 on hip extension, 5/5 on knee extension, and 4/5 on knee flexion; 12/03/18: L hip strength assesessed today for comparison: 4+/5 Hip flexion, 5/5 Hip ER in sitting, 5/5 Hip IR in sitting, 4/5 Hip abduction (reports pain on R hip from sidelying position), 4/5 Hip extension, 5/5 Knee extension, 4/5 Knee flexion    Time  12    Period  Weeks    Status  Revised    Target Date  02/18/19      PT LONG TERM GOAL #4   Title  Pt will decrease worst hip pain as reported on NPRS by at least 3 points in order to demonstrate clinically significant reduction in hip pain.    Baseline  11/23/18: 9/10    Time  12    Period  Weeks    Status  New    Target Date  02/18/19            Plan - 12/17/18 1139    Clinical Impression Statement  Pt demonstrates excellent motivation throughout today's session.  She continues to respond well to an increase in resistance and weight on strength exercises, responding well to increased challenges incorporating increased weight shift onto the RLE.  She reports a relief in muscle tension and pain following inferior hip glides along with hip flexor and quad stretching, and reports a 4/10 pain at the end of the session following e-stim and ice. Pt will benefit from skilled PT services to address deficits and return to pain-free function at home and work.    Personal Factors and Comorbidities  Comorbidity 3+    Comorbidities  DDD, migraine, IBS,    Examination-Activity Limitations  Bend;Lift;Squat;Caring for Others;Locomotion Level;Stairs;Carry;Stand    Examination-Participation Restrictions  Interpersonal Relationship;Yard Work;Cleaning;Laundry;Community Activity;Shop;Driving;Meal Prep    Stability/Clinical Decision Making  Stable/Uncomplicated    Rehab Potential  Good    PT Frequency  2x / week    PT Duration  12 weeks     PT Treatment/Interventions  Cryotherapy;Electrical Stimulation;Iontophoresis 4mg /ml Dexamethasone;Moist Heat;Ultrasound;DME Instruction;Gait training;Stair training;Functional mobility training;Therapeutic activities;Therapeutic exercise;Balance training;Neuromuscular re-education;Patient/family education;Manual techniques;Scar mobilization;Passive range of motion;Dry needling;Taping;Joint Manipulations;ADLs/Self Care Home Management;Aquatic Therapy;Canalith Repostioning;Vestibular;Spinal Manipulations    PT Next Visit Plan  progress strength and ROM per protocol    PT Home Exercise Plan  [email or web address]    Consulted and Agree with Plan of Care  Patient       Patient will benefit from skilled therapeutic intervention in order to improve the following deficits and impairments:  Decreased balance, Decreased endurance, Decreased range of motion, Pain, Decreased strength, Abnormal gait  Visit Diagnosis: Pain in right hip  Muscle weakness (generalized)     Problem List Patient Active Problem List   Diagnosis Date Noted  . Osteonecrosis of right hip ([place]) 11/12/2018  . Vitamin D deficiency 02/25/2018  . Anemia, unspecified 01/08/2017  . Chronic pain 10/24/2015  . Hot flashes 10/24/2015  . Mood changes 10/24/2015  . Migraine without aura and without status migrainosus, not intractable 10/24/2015  . Metabolic syndrome 123XX123  . Dyslipidemia 10/24/2015  . Allergic rhinitis, seasonal 05/17/2015  . Asthma, mild intermittent 08/08/2014  . Chronic insomnia 08/08/2014  . DDD (degenerative disc disease), lumbar 08/08/2014  . Mood disorder ([place]) 08/08/2014  .  Gastro-esophageal reflux disease without esophagitis 08/08/2014  . H/O total hysterectomy 08/08/2014  . Irritable bowel syndrome with diarrhea 08/08/2014  . Osteoarthritis of right hip 08/08/2014  . Adult BMI 30+ 08/08/2014  . SBO (spina bifida occulta) 08/08/2014    This entire session was performed under direct supervision and direction  of a licensed therapist/therapist assistant . I have personally read, edited and approve of the note as written.   Lutricia Horsfall, SPT Phillips Grout PT, DPT, GCS  Huprich,Jason 12/18/2018, 1:27 PM  [place] MAIN [facility] SERVICES [address] [place], [place], [address] Phone: [phone]   Fax:  [phone]  Name: Shelby Henson MRN: [id number] Date of Birth: Oct 14, 1970

## 2018-12-22 ENCOUNTER — Encounter: Payer: Self-pay | Admitting: Physical Therapy

## 2018-12-22 ENCOUNTER — Ambulatory Visit: Payer: No Typology Code available for payment source | Admitting: Physical Therapy

## 2018-12-22 ENCOUNTER — Other Ambulatory Visit: Payer: Self-pay

## 2018-12-22 DIAGNOSIS — M25551 Pain in right hip: Secondary | ICD-10-CM

## 2018-12-22 DIAGNOSIS — M6281 Muscle weakness (generalized): Secondary | ICD-10-CM

## 2018-12-22 NOTE — Patient Instructions (Signed)
Access Code: [email or web address]  URL: [email or web address]  Date: 12/22/2018  Prepared by: Blanche East   Exercises  Prone Knee Flexion - 15 reps - 2 sets - 1x daily - 7x weekly  Prone Hip Extension - 10 reps - 2 sets - 1x daily - 7x weekly  Prone Quadriceps Set with Towel Roll - 10 reps - 2 sets - 1x daily - 7x weekly  Sidelying Hip Circles - 10 reps - 2 sets - 1x daily - 7x weekly

## 2018-12-22 NOTE — Therapy (Signed)
[place] MAIN [facility] SERVICES [address] [place], [place], [address] Phone: [phone]   Fax:  [phone]  Physical Therapy Treatment  Patient Details  Name: Shelby Henson MRN: [id number] Date of Birth: [birth year] Referring Provider (PT): Dr. Harlow Mares   Encounter Date: 12/22/2018  PT End of Session - 12/22/18 1057    Visit Number  7    Number of Visits  25    Date for PT Re-Evaluation  02/18/19    PT Start Time  1016    PT Stop Time  1100    PT Time Calculation (min)  44 min    Activity Tolerance  Patient tolerated treatment well;Patient limited by pain    Behavior During Therapy  [facility] for tasks assessed/performed       Past Medical History:  Diagnosis Date  . Anemia   . Asthma    well controlled  . Complication of anesthesia   . Depression   . GERD (gastroesophageal reflux disease)    occ  . Migraine   . Osteoarthritis   . PONV (postoperative nausea and vomiting)    with c-section x 1    Past Surgical History:  Procedure Laterality Date  . ABDOMINAL HYSTERECTOMY    . ABLATION    . BREAST CYST ASPIRATION Left   . CESAREAN SECTION    . CESAREAN SECTION    . CESAREAN SECTION    . LAPAROSCOPY  2018  . LEEP    . TOTAL HIP ARTHROPLASTY Right 11/12/2018   Procedure: TOTAL HIP ARTHROPLASTY ANTERIOR APPROACH;  Surgeon: Lovell Sheehan, MD;  Location: [facility] ORS;  Service: Orthopedics;  Laterality: Right;  . TOTAL VAGINAL HYSTERECTOMY      There were no vitals filed for this visit.  Subjective Assessment - 12/22/18 1018    Subjective  Patient reports continued right hip pain; She reports it is worse in the morning and evening;Pt presents to therapy with SPC, she reports using it when outside of home, but is not using it inside the home.    Patient is accompained by:  Family member    Pertinent History  Pt presents s/p right anterior total hip arthroplasty on 11/12/18.  She has completed 2 sessions of PT at another facility,  however, her treatment sessions were not one-on-one, and she was not getting the feedback she was looking for on her exercises.    Limitations  Sitting;Standing;Walking;Lifting;House hold activities    How long can you sit comfortably?  45 min    How long can you stand comfortably?  30 min    How long can you walk comfortably?  15 min    Patient Stated Goals  would like to be able to go back to the gym and have no pain; would like to be able to cook and clean again    Currently in Pain?  Yes    Pain Score  4     Pain Location  Hip    Pain Orientation  Right    Pain Descriptors / Indicators  Aching;Sore    Pain Type  Surgical pain    Pain Radiating Towards  worse in groin    Pain Onset  More than a month ago    Pain Frequency  Constant    Aggravating Factors   unsure    Pain Relieving Factors  ice    Effect of Pain on Daily Activities  pushes through    Multiple Pain Sites  No  Therapeutic Exercise  Patient prone:  PT performed R prone quad/hipflexorstretchwith slight abduction 4x30s, put small towel under right knee to facilitate better stretch;   Alternate knee flexion x15 bilaterally with towel roll under RLE to facilitate better hip flexor stretch on RLE;  Hip extension x10 AROM with min Vcs to avoid trunk rotation for better hip strengthening; Patient denies any pain and reports less discomfort following exercise;   Quad set on RLE x10 reps AROM to facilitate increased hip stretch;   Sidelying RLE hip abduction circles clockwise/counterclockwise 2x10 with cues to avoid trunk rotation and improve hip positioning for optimal strengthening;   PT performed passive quad stretch 20 sec hold x2 reps;    Hooklying; Lumbar trunk rotation x1 min each direction with cues to avoid painful ROM;  Single knee to chest stretch 20 sec hold x2 reps on RLE;  Bridges with arms by side x10 reps for ROM Progressed to RLE single leg bridges 2x15 with arms across  chest and min VCs to increase push through RLE and tighten core muscles for better hip strengthening and muscle activation; Patient unable to achieve full ROM due to weakness but reports increase muscle pull in RLE;     Pt educated throughout session about proper posture and technique with exercises. Improved exercise technique, movement at target joints, use of target muscles after min to mod verbal, visual, tactile cues.    Pt demonstrates excellent motivation throughout today's session.Reinforced HEP; Advanced HEP, see patient instructions. Also recommended patient do standing exercises on each side to facilitate increased weight bearing and stabilization in RLE;   Finished with cryotherapy to right groin in sitting x5 min (unbilled)  Patient reports 4/10 pain at end of session;                 PT Short Term Goals - 11/27/18 1028      PT SHORT TERM GOAL #1   Title  Pt will be independent with HEP in order to improve strength and decrease back pain in order to improve pain-free function at home and work.    Time  6    Period  Weeks    Status  New    Target Date  01/07/19        PT Long Term Goals - 12/03/18 1232      PT LONG TERM GOAL #1   Title  Pt will increase her LEFS to greater than 60/80 in order to demonstrate a clinically significant change in order to improve her ability to perform ADLs and IADLs with less difficulty and to improve her quality of life.    Baseline  11/26/18: 9/80    Time  12    Period  Weeks    Status  New    Target Date  02/18/19      PT LONG TERM GOAL #2   Title  Pt will improve her R hip ROM to within 10 degrees of her L hip ROM  in order to improve her ability to perform ADLs and IADLs with less difficulty and to improve her quality of life.    Baseline  11/26/18: 28 degrees of R hip IR, 12 degrees of R hip ER, 108 degrees of hip flexion, 20 deg of hip abduction, and -2 deg of hip extension; 12/03/18: L hip assessed today  for comparison: L hip IR 45 deg, L hip ER 40 deg, L hip flexion 124 deg, L hip abduction 40 deg, PROM form hip extension L 25  deg, R 8 deg    Time  12    Period  Weeks    Status  New    Target Date  02/18/19      PT LONG TERM GOAL #3   Title  Pt will improve her R hip strength to a 4/5 in order to improve her ability to perform ADLs and IADLs with less difficulty and to improve her quality of life.    Baseline  11/26/18: 3+/5 hip flexion, 2+/5 hip ER (limited by pain and ROM), 3+/5 on hip IR, 3+/5 on hip abduction, 2+/5 on hip extension, 5/5 on knee extension, and 4/5 on knee flexion; 12/03/18: L hip strength assesessed today for comparison: 4+/5 Hip flexion, 5/5 Hip ER in sitting, 5/5 Hip IR in sitting, 4/5 Hip abduction (reports pain on R hip from sidelying position), 4/5 Hip extension, 5/5 Knee extension, 4/5 Knee flexion    Time  12    Period  Weeks    Status  Revised    Target Date  02/18/19      PT LONG TERM GOAL #4   Title  Pt will decrease worst hip pain as reported on NPRS by at least 3 points in order to demonstrate clinically significant reduction in hip pain.    Baseline  11/23/18: 9/10    Time  12    Period  Weeks    Status  New    Target Date  02/18/19            Plan - 12/22/18 1100    Clinical Impression Statement  Patient motivated and participated well within session. Advanced LE strengthening exercise. Patient reports less pain with passive stretches and with hip extension ROM. She denies any restrictions per MD. She does require min VCs for correct exercise technique and proper motor control. Finished session with cryotherapy to right hip to help reduce delayed onset muscle soreness.    Personal Factors and Comorbidities  Comorbidity 3+    Comorbidities  DDD, migraine, IBS,    Examination-Activity Limitations  Bend;Lift;Squat;Caring for Others;Locomotion Level;Stairs;Carry;Stand    Examination-Participation Restrictions  Interpersonal Relationship;Yard  Work;Cleaning;Laundry;Community Activity;Shop;Driving;Meal Prep    Stability/Clinical Decision Making  Stable/Uncomplicated    Rehab Potential  Good    PT Frequency  2x / week    PT Duration  12 weeks    PT Treatment/Interventions  Cryotherapy;Electrical Stimulation;Iontophoresis 4mg /ml Dexamethasone;Moist Heat;Ultrasound;DME Instruction;Gait training;Stair training;Functional mobility training;Therapeutic activities;Therapeutic exercise;Balance training;Neuromuscular re-education;Patient/family education;Manual techniques;Scar mobilization;Passive range of motion;Dry needling;Taping;Joint Manipulations;ADLs/Self Care Home Management;Aquatic Therapy;Canalith Repostioning;Vestibular;Spinal Manipulations    PT Next Visit Plan  progress strength and ROM per protocol    PT Home Exercise Plan  [email or web address]    Consulted and Agree with Plan of Care  Patient       Patient will benefit from skilled therapeutic intervention in order to improve the following deficits and impairments:  Decreased balance, Decreased endurance, Decreased range of motion, Pain, Decreased strength, Abnormal gait  Visit Diagnosis: Pain in right hip  Muscle weakness (generalized)     Problem List Patient Active Problem List   Diagnosis Date Noted  . Osteonecrosis of right hip ([place]) 11/12/2018  . Vitamin D deficiency 02/25/2018  . Anemia, unspecified 01/08/2017  . Chronic pain 10/24/2015  . Hot flashes 10/24/2015  . Mood changes 10/24/2015  . Migraine without aura and without status migrainosus, not intractable 10/24/2015  . Metabolic syndrome 123XX123  . Dyslipidemia 10/24/2015  . Allergic rhinitis, seasonal 05/17/2015  . Asthma, mild intermittent 08/08/2014  . Chronic  insomnia 08/08/2014  . DDD (degenerative disc disease), lumbar 08/08/2014  . Mood disorder ([place]) 08/08/2014  . Gastro-esophageal reflux disease without esophagitis 08/08/2014  . H/O total hysterectomy 08/08/2014  . Irritable bowel syndrome with  diarrhea 08/08/2014  . Osteoarthritis of right hip 08/08/2014  . Adult BMI 30+ 08/08/2014  . SBO (spina bifida occulta) 08/08/2014    Shelby Henson PT, DPT 12/22/2018, 11:03 AM  [place] MAIN [facility] SERVICES [address] [place], [place], [address] Phone: [phone]   Fax:  [phone]  Name: Shelby Henson MRN: [id number] Date of Birth: Jun 07, 1970

## 2018-12-24 ENCOUNTER — Encounter: Payer: Self-pay | Admitting: Physical Therapy

## 2018-12-24 ENCOUNTER — Other Ambulatory Visit: Payer: Self-pay

## 2018-12-24 ENCOUNTER — Ambulatory Visit: Payer: No Typology Code available for payment source | Admitting: Physical Therapy

## 2018-12-24 DIAGNOSIS — M6281 Muscle weakness (generalized): Secondary | ICD-10-CM

## 2018-12-24 DIAGNOSIS — M25551 Pain in right hip: Secondary | ICD-10-CM

## 2018-12-24 NOTE — Patient Instructions (Signed)
Access Code: [email or web address]  URL: [email or web address]  Date: 12/24/2018  Prepared by: Blanche East   Exercises  Standing Hip Extension with Resistance at Ankles and Counter Support - 15 reps - 2 sets - 1x daily - 7x weekly  Standing Hip Abduction with Resistance at Ankles and Counter Support - 15 reps - 2 sets - 1x daily - 7x weekly  Standing Hip Flexion with Resistance at Ankles and Counter Support - 15 reps - 2 sets - 1x daily - 7x weekly  Side Stepping with Resistance at Ankles and Counter Support - 5-10 reps - 2 sets - 1x daily - 7x weekly

## 2018-12-24 NOTE — Therapy (Signed)
[place] MAIN [facility] SERVICES [address] [place], [place], [address] Phone: [phone]   Fax:  [phone]  Physical Therapy Treatment  Patient Details  Name: Shelby Henson MRN: [id number] Date of Birth: 05-14-1970 Referring Provider (PT): Dr. Harlow Mares   Encounter Date: 12/24/2018  PT End of Session - 12/24/18 1035    Visit Number  8    Number of Visits  25    Date for PT Re-Evaluation  02/18/19    PT Start Time  1032    PT Stop Time  1105    PT Time Calculation (min)  33 min    Activity Tolerance  Patient tolerated treatment well;Patient limited by pain    Behavior During Therapy  [facility] for tasks assessed/performed       Past Medical History:  Diagnosis Date  . Anemia   . Asthma    well controlled  . Complication of anesthesia   . Depression   . GERD (gastroesophageal reflux disease)    occ  . Migraine   . Osteoarthritis   . PONV (postoperative nausea and vomiting)    with c-section x 1    Past Surgical History:  Procedure Laterality Date  . ABDOMINAL HYSTERECTOMY    . ABLATION    . BREAST CYST ASPIRATION Left   . CESAREAN SECTION    . CESAREAN SECTION    . CESAREAN SECTION    . LAPAROSCOPY  2018  . LEEP    . TOTAL HIP ARTHROPLASTY Right 11/12/2018   Procedure: TOTAL HIP ARTHROPLASTY ANTERIOR APPROACH;  Surgeon: Lovell Sheehan, MD;  Location: [facility] ORS;  Service: Orthopedics;  Laterality: Right;  . TOTAL VAGINAL HYSTERECTOMY      There were no vitals filed for this visit.  Subjective Assessment - 12/24/18 1032    Subjective  Patient reports some stiffnessin right hip; She reports having a good appointment with orthopedic with good X-rays and everything looking stable and aligned;    Patient is accompained by:  Family member    Pertinent History  Pt presents s/p right anterior total hip arthroplasty on 11/12/18.  She has completed 2 sessions of PT at another facility, however, her treatment sessions were not  one-on-one, and she was not getting the feedback she was looking for on her exercises.    Limitations  Sitting;Standing;Walking;Lifting;House hold activities    How long can you sit comfortably?  45 min    How long can you stand comfortably?  30 min    How long can you walk comfortably?  15 min    Patient Stated Goals  would like to be able to go back to the gym and have no pain; would like to be able to cook and clean again    Currently in Pain?  Yes    Pain Score  4     Pain Location  Hip    Pain Orientation  Right    Pain Descriptors / Indicators  Aching;Sore    Pain Type  Surgical pain    Pain Onset  More than a month ago    Pain Frequency  Constant    Aggravating Factors   unsure    Pain Relieving Factors  ice    Effect of Pain on Daily Activities  pushes through              Therapeutic Exercise  Patient prone:  PT performed R prone quad/hipflexorstretchwith slight abduction 4x30s, put small towel under right knee to  facilitate better stretch;   Alternate knee flexion x15 bilaterally with towel roll under RLE to facilitate better hip flexor stretch on RLE;  Hip extension x10 AROM with min Vcs to avoid trunk rotation for better hip strengthening; Patient denies any pain and reports less discomfort following exercise;   Quad set on RLE x10 reps AROM to facilitate increased hip stretch;   Instructed patient in standing exercise: Standing with red tband around BLE: -hip abduction x15 bilaterally; -hip extension x15 bilaterally; -hip flexion x15 bilaterally; -side stepping x10 feet x2 laps each direction with intermittent rail assist;   Forward lunges on level surface x10 reps each LE with B rail assist for safety and cues for proper positioning to improve motor control;   Standing on firm surface:  Alternate toe taps to 6 inch step unsupported x15 reps bilaterally with good weight shift noted;   Side step up and over 6 inch step x10 reps each direction with  HHA for balance and control; Patient reports mild right hip pain during side step up but states this is minimal.   Finished with patient supine: PT performed long axis distraction 30 sec hold, 10 sec rest x2-3 min; Patient reports less catch and less groin pain following long axis distraction;    Pt educated throughout session about proper posture and technique with exercises. Improved exercise technique, movement at target joints, use of target muscles after min to mod verbal, visual, tactile cues.    Pt demonstrates excellent motivation throughout today's session.Reinforced HEP; Advanced HEP, see patient instructions.  Finished with cryotherapy to right groin in supine x5 min (unbilled)  Patient reports 2-3/10 pain at end of session;                    PT Education - 12/24/18 1035    Education Details  LE strength, ROM/HEP reinforced;    Person(s) Educated  Patient    Methods  Explanation;Verbal cues    Comprehension  Verbalized understanding;Returned demonstration;Verbal cues required;Need further instruction       PT Short Term Goals - 11/27/18 1028      PT SHORT TERM GOAL #1   Title  Pt will be independent with HEP in order to improve strength and decrease back pain in order to improve pain-free function at home and work.    Time  6    Period  Weeks    Status  New    Target Date  01/07/19        PT Long Term Goals - 12/03/18 1232      PT LONG TERM GOAL #1   Title  Pt will increase her LEFS to greater than 60/80 in order to demonstrate a clinically significant change in order to improve her ability to perform ADLs and IADLs with less difficulty and to improve her quality of life.    Baseline  11/26/18: 9/80    Time  12    Period  Weeks    Status  New    Target Date  02/18/19      PT LONG TERM GOAL #2   Title  Pt will improve her R hip ROM to within 10 degrees of her L hip ROM  in order to improve her ability to perform ADLs and IADLs  with less difficulty and to improve her quality of life.    Baseline  11/26/18: 28 degrees of R hip IR, 12 degrees of R hip ER, 108 degrees of hip flexion, 20 deg of  hip abduction, and -2 deg of hip extension; 12/03/18: L hip assessed today for comparison: L hip IR 45 deg, L hip ER 40 deg, L hip flexion 124 deg, L hip abduction 40 deg, PROM form hip extension L 25 deg, R 8 deg    Time  12    Period  Weeks    Status  New    Target Date  02/18/19      PT LONG TERM GOAL #3   Title  Pt will improve her R hip strength to a 4/5 in order to improve her ability to perform ADLs and IADLs with less difficulty and to improve her quality of life.    Baseline  11/26/18: 3+/5 hip flexion, 2+/5 hip ER (limited by pain and ROM), 3+/5 on hip IR, 3+/5 on hip abduction, 2+/5 on hip extension, 5/5 on knee extension, and 4/5 on knee flexion; 12/03/18: L hip strength assesessed today for comparison: 4+/5 Hip flexion, 5/5 Hip ER in sitting, 5/5 Hip IR in sitting, 4/5 Hip abduction (reports pain on R hip from sidelying position), 4/5 Hip extension, 5/5 Knee extension, 4/5 Knee flexion    Time  12    Period  Weeks    Status  Revised    Target Date  02/18/19      PT LONG TERM GOAL #4   Title  Pt will decrease worst hip pain as reported on NPRS by at least 3 points in order to demonstrate clinically significant reduction in hip pain.    Baseline  11/23/18: 9/10    Time  12    Period  Weeks    Status  New    Target Date  02/18/19            Plan - 12/24/18 1113    Clinical Impression Statement  Patient motivated and participated well within session. Advanced LE strengthening exercise, instructing patient in standing exercise. She reports mild increase in RLE hip pain but states this is minimal. Patient requires min VCs for correct exercise technique; Finished with cryotherapy to right hip at end of session; Patient tolerated well reporting less pain at end of session; She would benefit from additional skilled PT  Intervention to improve strength, balance and mobility;    Personal Factors and Comorbidities  Comorbidity 3+    Comorbidities  DDD, migraine, IBS,    Examination-Activity Limitations  Bend;Lift;Squat;Caring for Others;Locomotion Level;Stairs;Carry;Stand    Examination-Participation Restrictions  Interpersonal Relationship;Yard Work;Cleaning;Laundry;Community Activity;Shop;Driving;Meal Prep    Stability/Clinical Decision Making  Stable/Uncomplicated    Rehab Potential  Good    PT Frequency  2x / week    PT Duration  12 weeks    PT Treatment/Interventions  Cryotherapy;Electrical Stimulation;Iontophoresis 4mg /ml Dexamethasone;Moist Heat;Ultrasound;DME Instruction;Gait training;Stair training;Functional mobility training;Therapeutic activities;Therapeutic exercise;Balance training;Neuromuscular re-education;Patient/family education;Manual techniques;Scar mobilization;Passive range of motion;Dry needling;Taping;Joint Manipulations;ADLs/Self Care Home Management;Aquatic Therapy;Canalith Repostioning;Vestibular;Spinal Manipulations    PT Next Visit Plan  progress strength and ROM per protocol    PT Home Exercise Plan  [id number]    Consulted and Agree with Plan of Care  Patient       Patient will benefit from skilled therapeutic intervention in order to improve the following deficits and impairments:  Decreased balance, Decreased endurance, Decreased range of motion, Pain, Decreased strength, Abnormal gait  Visit Diagnosis: Pain in right hip  Muscle weakness (generalized)     Problem List Patient Active Problem List   Diagnosis Date Noted  . Osteonecrosis of right hip ([place]) 11/12/2018  . Vitamin D deficiency 02/25/2018  .  Anemia, unspecified 01/08/2017  . Chronic pain 10/24/2015  . Hot flashes 10/24/2015  . Mood changes 10/24/2015  . Migraine without aura and without status migrainosus, not intractable 10/24/2015  . Metabolic syndrome 123XX123  . Dyslipidemia 10/24/2015  . Allergic  rhinitis, seasonal 05/17/2015  . Asthma, mild intermittent 08/08/2014  . Chronic insomnia 08/08/2014  . DDD (degenerative disc disease), lumbar 08/08/2014  . Mood disorder ([place]) 08/08/2014  . Gastro-esophageal reflux disease without esophagitis 08/08/2014  . H/O total hysterectomy 08/08/2014  . Irritable bowel syndrome with diarrhea 08/08/2014  . Osteoarthritis of right hip 08/08/2014  . Adult BMI 30+ 08/08/2014  . SBO (spina bifida occulta) 08/08/2014    Yi Falletta,MargaretPT, DPT 12/24/2018, 12:36 PM  [place] MAIN [facility] SERVICES [address] [place], [place], [address] Phone: [phone]   Fax:  [phone]  Name: Shelby Henson MRN: [id number] Date of Birth: [birth year]

## 2018-12-29 ENCOUNTER — Ambulatory Visit: Payer: No Typology Code available for payment source

## 2018-12-29 ENCOUNTER — Encounter: Payer: Self-pay | Admitting: Obstetrics & Gynecology

## 2018-12-29 ENCOUNTER — Other Ambulatory Visit: Payer: Self-pay

## 2018-12-29 ENCOUNTER — Encounter: Payer: Self-pay | Admitting: Physical Therapy

## 2018-12-29 DIAGNOSIS — M25551 Pain in right hip: Secondary | ICD-10-CM | POA: Diagnosis not present

## 2018-12-29 DIAGNOSIS — M6281 Muscle weakness (generalized): Secondary | ICD-10-CM

## 2018-12-29 NOTE — Therapy (Deleted)
[place] MAIN [facility] SERVICES [address] [place], [place], [address] Phone: [phone]   Fax:  [phone]  Physical Therapy Treatment  Patient Details  Name: Shelby Henson MRN: [id number] Date of Birth: 1970/01/30 Referring Provider (PT): Dr. Harlow Mares   Encounter Date: 12/29/2018    Past Medical History:  Diagnosis Date  . Anemia   . Asthma    well controlled  . Complication of anesthesia   . Depression   . GERD (gastroesophageal reflux disease)    occ  . Migraine   . Osteoarthritis   . PONV (postoperative nausea and vomiting)    with c-section x 1    Past Surgical History:  Procedure Laterality Date  . ABDOMINAL HYSTERECTOMY    . ABLATION    . BREAST CYST ASPIRATION Left   . CESAREAN SECTION    . CESAREAN SECTION    . CESAREAN SECTION    . LAPAROSCOPY  2018  . LEEP    . TOTAL HIP ARTHROPLASTY Right 11/12/2018   Procedure: TOTAL HIP ARTHROPLASTY ANTERIOR APPROACH;  Surgeon: Lovell Sheehan, MD;  Location: [facility] ORS;  Service: Orthopedics;  Laterality: Right;  . TOTAL VAGINAL HYSTERECTOMY      There were no vitals filed for this visit.               Therapeutic Exercise   Patient prone:  PT performedR prone quad/hipflexorstretchwith slight abduction 4x30s, put small towel under right knee to facilitate better stretch;   Alternate knee flexion x15 bilaterally with towel roll under RLE to facilitate better hip flexor stretch on RLE;  Hip extension x10 AROM with min Vcs to avoid trunk rotation for better hip strengthening; Patient denies any pain and reports less discomfort following exercise;   Quad set on RLE x10 reps AROM to facilitate increased hip stretch;   Instructed patient in standing exercise: Standing with red tband around BLE: -hip abduction x15 bilaterally; -hip extension x15 bilaterally; -hip flexion x15 bilaterally; -side stepping x10 feet x2 laps each direction with intermittent  rail assist;   Forward lunges on level surface x10 reps each LE with B rail assist for safety and cues for proper positioning to improve motor control;   Standing on firm surface:  Alternate toe taps to 6 inch step unsupported x15 reps bilaterally with good weight shift noted;   Side step up and over 6 inch step x10 reps each direction with HHA for balance and control; Patient reports mild right hip pain during side step up but states this is minimal.   Finished with patient supine: PT performed long axis distraction 30 sec hold, 10 sec rest x2-3 min; Patient reports less catch and less groin pain following long axis distraction;   Pt educated throughout session about proper posture and technique with exercises. Improved exercise technique, movement at target joints, use of target muscles after min to mod verbal, visual, tactile cues.    Pt demonstrates excellent motivation throughout today's session.Reinforced HEP; Advanced HEP, see patient instructions.  Finished with cryotherapy to right groin in supine x5 min (unbilled)  Patient reports 2-3/10 pain at end of session;                   PT Short Term Goals - 11/27/18 1028      PT SHORT TERM GOAL #1   Title  Pt will be independent with HEP in order to improve strength and decrease back pain in order to improve pain-free function at  home and work.    Time  6    Period  Weeks    Status  New    Target Date  01/07/19        PT Long Term Goals - 12/03/18 1232      PT LONG TERM GOAL #1   Title  Pt will increase her LEFS to greater than 60/80 in order to demonstrate a clinically significant change in order to improve her ability to perform ADLs and IADLs with less difficulty and to improve her quality of life.    Baseline  11/26/18: 9/80    Time  12    Period  Weeks    Status  New    Target Date  02/18/19      PT LONG TERM GOAL #2   Title  Pt will improve her R hip ROM to within 10 degrees  of her L hip ROM  in order to improve her ability to perform ADLs and IADLs with less difficulty and to improve her quality of life.    Baseline  11/26/18: 28 degrees of R hip IR, 12 degrees of R hip ER, 108 degrees of hip flexion, 20 deg of hip abduction, and -2 deg of hip extension; 12/03/18: L hip assessed today for comparison: L hip IR 45 deg, L hip ER 40 deg, L hip flexion 124 deg, L hip abduction 40 deg, PROM form hip extension L 25 deg, R 8 deg    Time  12    Period  Weeks    Status  New    Target Date  02/18/19      PT LONG TERM GOAL #3   Title  Pt will improve her R hip strength to a 4/5 in order to improve her ability to perform ADLs and IADLs with less difficulty and to improve her quality of life.    Baseline  11/26/18: 3+/5 hip flexion, 2+/5 hip ER (limited by pain and ROM), 3+/5 on hip IR, 3+/5 on hip abduction, 2+/5 on hip extension, 5/5 on knee extension, and 4/5 on knee flexion; 12/03/18: L hip strength assesessed today for comparison: 4+/5 Hip flexion, 5/5 Hip ER in sitting, 5/5 Hip IR in sitting, 4/5 Hip abduction (reports pain on R hip from sidelying position), 4/5 Hip extension, 5/5 Knee extension, 4/5 Knee flexion    Time  12    Period  Weeks    Status  Revised    Target Date  02/18/19      PT LONG TERM GOAL #4   Title  Pt will decrease worst hip pain as reported on NPRS by at least 3 points in order to demonstrate clinically significant reduction in hip pain.    Baseline  11/23/18: 9/10    Time  12    Period  Weeks    Status  New    Target Date  02/18/19              Patient will benefit from skilled therapeutic intervention in order to improve the following deficits and impairments:     Visit Diagnosis: No diagnosis found.     Problem List Patient Active Problem List   Diagnosis Date Noted  . Osteonecrosis of right hip ([place]) 11/12/2018  . Vitamin D deficiency 02/25/2018  . Anemia, unspecified 01/08/2017  . Chronic pain 10/24/2015  . Hot flashes  10/24/2015  . Mood changes 10/24/2015  . Migraine without aura and without status migrainosus, not intractable 10/24/2015  . Metabolic syndrome 123XX123  .  Dyslipidemia 10/24/2015  . Allergic rhinitis, seasonal 05/17/2015  . Asthma, mild intermittent 08/08/2014  . Chronic insomnia 08/08/2014  . DDD (degenerative disc disease), lumbar 08/08/2014  . Mood disorder ([place]) 08/08/2014  . Gastro-esophageal reflux disease without esophagitis 08/08/2014  . H/O total hysterectomy 08/08/2014  . Irritable bowel syndrome with diarrhea 08/08/2014  . Osteoarthritis of right hip 08/08/2014  . Adult BMI 30+ 08/08/2014  . SBO (spina bifida occulta) 08/08/2014    , 12/29/2018, 10:17 AM  [place] MAIN [facility] SERVICES [address] [place], [place], [address] Phone: [phone]   Fax:  [phone]  Name: ESTELLENE SANMIGUEL MRN: [id number] Date of Birth: 05-21-1970

## 2018-12-29 NOTE — Therapy (Signed)
[place] MAIN [facility] SERVICES [address] [place], [place], [address] Phone: [phone]   Fax:  [phone]  Physical Therapy Treatment  Patient Details  Name: Shelby Henson MRN: [id number] Date of Birth: November 15, 1970 Referring Provider (PT): Dr. Harlow Mares   Encounter Date: 12/29/2018  PT End of Session - 12/29/18 1112    Visit Number  9    Number of Visits  25    Date for PT Re-Evaluation  02/18/19    PT Start Time  [id number]    PT Stop Time  1101    PT Time Calculation (min)  46 min    Equipment Utilized During Treatment  Gait belt    Activity Tolerance  Patient tolerated treatment well    Behavior During Therapy  [facility] for tasks assessed/performed       Past Medical History:  Diagnosis Date  . Anemia   . Asthma    well controlled  . Complication of anesthesia   . Depression   . GERD (gastroesophageal reflux disease)    occ  . Migraine   . Osteoarthritis   . PONV (postoperative nausea and vomiting)    with c-section x 1    Past Surgical History:  Procedure Laterality Date  . ABDOMINAL HYSTERECTOMY    . ABLATION    . BREAST CYST ASPIRATION Left   . CESAREAN SECTION    . CESAREAN SECTION    . CESAREAN SECTION    . LAPAROSCOPY  2018  . LEEP    . TOTAL HIP ARTHROPLASTY Right 11/12/2018   Procedure: TOTAL HIP ARTHROPLASTY ANTERIOR APPROACH;  Surgeon: Lovell Sheehan, MD;  Location: [facility] ORS;  Service: Orthopedics;  Laterality: Right;  . TOTAL VAGINAL HYSTERECTOMY      There were no vitals filed for this visit.  Subjective Assessment - 12/29/18 1027    Subjective  Patient drove to physical therapy this morning. was able to vacuum for the first time yesterday. Woke up with 2/10 pain.    Patient is accompained by:  Family member    Pertinent History  Pt presents s/p right anterior total hip arthroplasty on 11/12/18.  She has completed 2 sessions of PT at another facility, however, her treatment sessions were not one-on-one, and she  was not getting the feedback she was looking for on her exercises.    Limitations  Sitting;Standing;Walking;Lifting;House hold activities    How long can you sit comfortably?  45 min    How long can you stand comfortably?  30 min    How long can you walk comfortably?  15 min    Patient Stated Goals  would like to be able to go back to the gym and have no pain; would like to be able to cook and clean again    Currently in Pain?  Yes    Pain Score  4     Pain Location  Hip    Pain Orientation  Right    Pain Descriptors / Indicators  Aching;Sore    Pain Type  Surgical pain    Pain Onset  More than a month ago    Pain Frequency  Constant         Drove here today,    octane fitness Lvl 2, 3 minutes for warm up, education on positioning      Therapeutic Exercise  Patient prone:  PT performedR prone quad/hipflexorstretchwith slight abduction 4x30s, put small towel under right knee to facilitate better stretch;   Alternate  knee flexion x15 bilaterally with towel roll under RLE to facilitate better hip flexor stretch on RLE;  Hamstring curl 2.5 lb 20x RLE   Hip extension x10 AROM with min Vcs to avoid trunk rotation for better hip strengthening; Patient denies any pain and reports less discomfort following exercise;    Sit to stand from plinth table with RTB around knees 15x cueing for knee abduction for equal weight shift/acceptance  Standing hip flexor lunge stretch; cueing for neutral pelvic alignment for optimal stretch 2x 30 second holds  Standing with red tband around BLE ankles: -hip abduction side stepping 2x length of // bars  -hip extension x15 bilaterally cueing for upright posture and neutral pelvic alignment.   airex pad: 6" step modified tandem stance with one foot on each surface hold 30 seconds x 2 trials each LE,   LLE on soccer ball for weight acceptance onto RLE 60 seconds.  Patient educated on pelvic shift L and right "booty bump" side to side  to "loosen" LE prior to ambulation, patient reports it feels good to perform    swiss ball forward rollout, lateral rollout 5x each direction for low back reduction of "tight" symptoms and improved muscle tissue length.    Pt educated throughout session about proper posture and technique with exercises. Improved exercise technique, movement at target joints, use of target muscles after min to mod verbal, visual, tactile cues.              PT Education - 12/29/18 1112    Education Details  weight shift, weight acceptance, body mechanics    Person(s) Educated  Patient    Methods  Explanation;Tactile cues;Verbal cues;Demonstration    Comprehension  Verbalized understanding;Returned demonstration;Verbal cues required;Tactile cues required       PT Short Term Goals - 11/27/18 1028      PT SHORT TERM GOAL #1   Title  Pt will be independent with HEP in order to improve strength and decrease back pain in order to improve pain-free function at home and work.    Time  6    Period  Weeks    Status  New    Target Date  01/07/19        PT Long Term Goals - 12/03/18 1232      PT LONG TERM GOAL #1   Title  Pt will increase her LEFS to greater than 60/80 in order to demonstrate a clinically significant change in order to improve her ability to perform ADLs and IADLs with less difficulty and to improve her quality of life.    Baseline  11/26/18: 9/80    Time  12    Period  Weeks    Status  New    Target Date  02/18/19      PT LONG TERM GOAL #2   Title  Pt will improve her R hip ROM to within 10 degrees of her L hip ROM  in order to improve her ability to perform ADLs and IADLs with less difficulty and to improve her quality of life.    Baseline  11/26/18: 28 degrees of R hip IR, 12 degrees of R hip ER, 108 degrees of hip flexion, 20 deg of hip abduction, and -2 deg of hip extension; 12/03/18: L hip assessed today for comparison: L hip IR 45 deg, L hip ER 40 deg, L hip  flexion 124 deg, L hip abduction 40 deg, PROM form hip extension L 25 deg, R 8 deg  Time  12    Period  Weeks    Status  New    Target Date  02/18/19      PT LONG TERM GOAL #3   Title  Pt will improve her R hip strength to a 4/5 in order to improve her ability to perform ADLs and IADLs with less difficulty and to improve her quality of life.    Baseline  11/26/18: 3+/5 hip flexion, 2+/5 hip ER (limited by pain and ROM), 3+/5 on hip IR, 3+/5 on hip abduction, 2+/5 on hip extension, 5/5 on knee extension, and 4/5 on knee flexion; 12/03/18: L hip strength assesessed today for comparison: 4+/5 Hip flexion, 5/5 Hip ER in sitting, 5/5 Hip IR in sitting, 4/5 Hip abduction (reports pain on R hip from sidelying position), 4/5 Hip extension, 5/5 Knee extension, 4/5 Knee flexion    Time  12    Period  Weeks    Status  Revised    Target Date  02/18/19      PT LONG TERM GOAL #4   Title  Pt will decrease worst hip pain as reported on NPRS by at least 3 points in order to demonstrate clinically significant reduction in hip pain.    Baseline  11/23/18: 9/10    Time  12    Period  Weeks    Status  New    Target Date  02/18/19            Plan - 12/29/18 1113    Clinical Impression Statement  Patient presents with excellent motivation throughout physical therapy session. Continues to progress with functional mobility and posture with improved ability to weight shift and stabilize with RLE. She would benefit from additional skilled PT Intervention to improve strength, balance and mobility;    Personal Factors and Comorbidities  Comorbidity 3+    Comorbidities  DDD, migraine, IBS,    Examination-Activity Limitations  Bend;Lift;Squat;Caring for Others;Locomotion Level;Stairs;Carry;Stand    Examination-Participation Restrictions  Interpersonal Relationship;Yard Work;Cleaning;Laundry;Community Activity;Shop;Driving;Meal Prep    Stability/Clinical Decision Making  Stable/Uncomplicated    Rehab Potential   Good    PT Frequency  2x / week    PT Duration  12 weeks    PT Treatment/Interventions  Cryotherapy;Electrical Stimulation;Iontophoresis 4mg /ml Dexamethasone;Moist Heat;Ultrasound;DME Instruction;Gait training;Stair training;Functional mobility training;Therapeutic activities;Therapeutic exercise;Balance training;Neuromuscular re-education;Patient/family education;Manual techniques;Scar mobilization;Passive range of motion;Dry needling;Taping;Joint Manipulations;ADLs/Self Care Home Management;Aquatic Therapy;Canalith Repostioning;Vestibular;Spinal Manipulations    PT Next Visit Plan  progress strength and ROM per protocol    PT Home Exercise Plan  [email or web address]    Consulted and Agree with Plan of Care  Patient       Patient will benefit from skilled therapeutic intervention in order to improve the following deficits and impairments:  Decreased balance, Decreased endurance, Decreased range of motion, Pain, Decreased strength, Abnormal gait  Visit Diagnosis: Pain in right hip  Muscle weakness (generalized)     Problem List Patient Active Problem List   Diagnosis Date Noted  . Osteonecrosis of right hip ([place]) 11/12/2018  . Vitamin D deficiency 02/25/2018  . Anemia, unspecified 01/08/2017  . Chronic pain 10/24/2015  . Hot flashes 10/24/2015  . Mood changes 10/24/2015  . Migraine without aura and without status migrainosus, not intractable 10/24/2015  . Metabolic syndrome [id number]  . Dyslipidemia 10/24/2015  . Allergic rhinitis, seasonal 05/17/2015  . Asthma, mild intermittent 08/08/2014  . Chronic insomnia 08/08/2014  . DDD (degenerative disc disease), lumbar 08/08/2014  . Mood disorder ([place]) 08/08/2014  . Gastro-esophageal reflux disease without  esophagitis 08/08/2014  . H/O total hysterectomy 08/08/2014  . Irritable bowel syndrome with diarrhea 08/08/2014  . Osteoarthritis of right hip 08/08/2014  . Adult BMI 30+ 08/08/2014  . SBO (spina bifida occulta) 08/08/2014   Janna Arch, PT, DPT    12/29/2018, 11:13 AM  [place] MAIN [facility] SERVICES [address] [place], [place], [address] Phone: [phone]   Fax:  [phone]  Name: MAJOR HAEGELE MRN: [id number] Date of Birth: Jun 26, 1970

## 2018-12-31 ENCOUNTER — Ambulatory Visit: Payer: No Typology Code available for payment source | Attending: Orthopedic Surgery

## 2018-12-31 ENCOUNTER — Other Ambulatory Visit: Payer: Self-pay

## 2018-12-31 DIAGNOSIS — M6281 Muscle weakness (generalized): Secondary | ICD-10-CM | POA: Insufficient documentation

## 2018-12-31 DIAGNOSIS — M25551 Pain in right hip: Secondary | ICD-10-CM | POA: Insufficient documentation

## 2018-12-31 NOTE — Therapy (Signed)
[place] MAIN [facility] SERVICES [address] [place], [place], [address] Phone: [phone]   Fax:  [phone]  Physical Therapy Treatment Physical Therapy Progress Note   Dates of reporting period  11/26/18   to   12/31/18  Patient Details  Name: Shelby Henson MRN: [id number] Date of Birth: 06/01/1970 Referring Provider (PT): Dr. Harlow Mares   Encounter Date: 12/31/2018  PT End of Session - 12/31/18 1023    Visit Number  10    Number of Visits  25    Date for PT Re-Evaluation  02/18/19    Authorization Type  10/10 next session 1/10 PN 12/31/18    PT Start Time  1028    PT Stop Time  1113    PT Time Calculation (min)  45 min    Equipment Utilized During Treatment  Gait belt    Activity Tolerance  Patient tolerated treatment well    Behavior During Therapy  WFL for tasks assessed/performed       Past Medical History:  Diagnosis Date  . Anemia   . Asthma    well controlled  . Complication of anesthesia   . Depression   . GERD (gastroesophageal reflux disease)    occ  . Migraine   . Osteoarthritis   . PONV (postoperative nausea and vomiting)    with c-section x 1    Past Surgical History:  Procedure Laterality Date  . ABDOMINAL HYSTERECTOMY    . ABLATION    . BREAST CYST ASPIRATION Left   . CESAREAN SECTION    . CESAREAN SECTION    . CESAREAN SECTION    . LAPAROSCOPY  2018  . LEEP    . TOTAL HIP ARTHROPLASTY Right 11/12/2018   Procedure: TOTAL HIP ARTHROPLASTY ANTERIOR APPROACH;  Surgeon: Lovell Sheehan, MD;  Location: [facility] ORS;  Service: Orthopedics;  Laterality: Right;  . TOTAL VAGINAL HYSTERECTOMY      There were no vitals filed for this visit.  Subjective Assessment - 12/31/18 1033    Subjective  Patient reports being stiff first thing in the morning. Has been doing her HEP, no falls since last session.    Patient is accompained by:  Family member    Pertinent History  Pt presents s/p right anterior total hip  arthroplasty on 11/12/18.  She has completed 2 sessions of PT at another facility, however, her treatment sessions were not one-on-one, and she was not getting the feedback she was looking for on her exercises.    Limitations  Sitting;Standing;Walking;Lifting;House hold activities    How long can you sit comfortably?  45 min    How long can you stand comfortably?  30 min    How long can you walk comfortably?  15 min    Patient Stated Goals  would like to be able to go back to the gym and have no pain; would like to be able to cook and clean again    Currently in Pain?  Yes    Pain Score  4     Pain Location  Hip    Pain Orientation  Right    Pain Descriptors / Indicators  Aching    Pain Type  Surgical pain    Pain Onset  More than a month ago    Pain Frequency  Constant        Goals:: 25/80  LEFS R hip ROM Flexion: L 122 R 114  Abduction R: 31 degrees  Extension: R 11 degrees  R hip strength VAS: 5/10     Therapeutic Exercise   Patient prone:  PT performed R prone quad/hipflexor stretch with slight abduction 4x30s, put small towel under right knee to facilitate better stretch;    Alternate knee flexion x15 bilaterally with towel roll under RLE to facilitate better hip flexor stretch on RLE;  Prone hamstring curl with 3lb ankle weight, 20x     Instructed patient in standing exercise: Standing with red tband around BLE: -hip abduction x15 bilaterally; -hip extension x15 bilaterally; -hip flexion x15 bilaterally; -side stepping x10 feet x2 laps each direction with intermittent rail assist;    Forward lunges on level surface x10 reps each LE with B rail assist for safety and cues for proper positioning to improve motor control;    Standing on firm surface:  Alternate toe taps to 6 inch step unsupported x15 reps bilaterally with good weight shift noted;    bosu ball lateral modified lunges 10x each LE; focus on slow muscle activation and controlled eccentric motion with  BUE support.   bosu ball lateral lunges modified 10x each LE focus on slow muscle activation and controlled eccentric motion with BUE support.    Pt educated throughout session about proper posture and technique with exercises. Improved exercise technique, movement at target joints, use of target muscles after min to mod verbal, visual, tactile cues.           Patient's condition has the potential to improve in response to therapy. Maximum improvement is yet to be obtained. The anticipated improvement is attainable and reasonable in a generally predictable time.  Patient reports improved functional range of motion and strength. She continues to be limited by stairs at this time.                        PT Education - 12/31/18 1023    Education Details  goals, exercise technique, body mechanics    Person(s) Educated  Patient    Methods  Explanation;Demonstration;Tactile cues;Verbal cues    Comprehension  Verbalized understanding;Returned demonstration;Verbal cues required;Tactile cues required       PT Short Term Goals - 12/31/18 1035      PT SHORT TERM GOAL #1   Title  Pt will be independent with HEP in order to improve strength and decrease back pain in order to improve pain-free function at home and work.    Baseline  12/2: HEP compliant    Time  6    Period  Weeks    Status  Partially Met    Target Date  01/07/19        PT Long Term Goals - 12/31/18 1052      PT LONG TERM GOAL #1   Title  Pt will increase her LEFS to greater than 60/80 in order to demonstrate a clinically significant change in order to improve her ability to perform ADLs and IADLs with less difficulty and to improve her quality of life.    Baseline  11/26/18: 9/80 12/2: 25/80    Time  12    Period  Weeks    Status  Partially Met    Target Date  02/18/19      PT LONG TERM GOAL #2   Title  Pt will improve her R hip ROM to within 10 degrees of her L hip ROM  in order to improve her  ability to perform ADLs and IADLs with less difficulty and to improve her quality of  life.    Baseline  11/26/18: 28 degrees of R hip IR, 12 degrees of R hip ER, 108 degrees of hip flexion, 20 deg of hip abduction, and -2 deg of hip extension; 12/03/18: L hip assessed today for comparison: L hip IR 45 deg, L hip ER 40 deg, L hip flexion 124 deg, L hip abduction 40 deg, PROM form hip extension L 25 deg, R 8 deg 12/2: R hip : flexion 114, abductoin 31 degrees, extension 11 degrees    Time  12    Period  Weeks    Status  Partially Met    Target Date  02/18/19      PT LONG TERM GOAL #3   Title  Pt will improve her R hip strength to a 4/5 in order to improve her ability to perform ADLs and IADLs with less difficulty and to improve her quality of life.    Baseline  11/26/18: 3+/5 hip flexion, 2+/5 hip ER (limited by pain and ROM), 3+/5 on hip IR, 3+/5 on hip abduction, 2+/5 on hip extension, 5/5 on knee extension, and 4/5 on knee flexion; 12/03/18: L hip strength assesessed today for comparison: 4+/5 Hip flexion, 5/5 Hip ER in sitting, 5/5 Hip IR in sitting, 4/5 Hip abduction (reports pain on R hip from sidelying position), 4/5 Hip extension, 5/5 Knee extension, 4/5 Knee flexion 12/2: R hip flexion, abduction, and adduction 4-/5 hip extension 3-/5    Time  12    Period  Weeks    Status  Partially Met    Target Date  02/18/19      PT LONG TERM GOAL #4   Title  Pt will decrease worst hip pain as reported on NPRS by at least 3 points in order to demonstrate clinically significant reduction in hip pain.    Baseline  11/23/18: 9/10 12/2: 5/10    Time  12    Period  Weeks    Status  Partially Met    Target Date  02/18/19            Plan - 12/31/18 1238    Clinical Impression Statement  Patient demonstrates improved ROM, strength, and decreased pain progressing towards functional goals at this time. She is challenged with eccentric control of RLE at this time.  Patient's condition has the potential  to improve in response to therapy. Maximum improvement is yet to be obtained. The anticipated improvement is attainable and reasonable in a generally predictable time.  She would benefit from additional skilled PT Intervention to improve strength, balance and mobility;    Personal Factors and Comorbidities  Comorbidity 3+    Comorbidities  DDD, migraine, IBS,    Examination-Activity Limitations  Bend;Lift;Squat;Caring for Others;Locomotion Level;Stairs;Carry;Stand    Examination-Participation Restrictions  Interpersonal Relationship;Yard Work;Cleaning;Laundry;Community Activity;Shop;Driving;Meal Prep    Stability/Clinical Decision Making  Stable/Uncomplicated    Rehab Potential  Good    PT Frequency  2x / week    PT Duration  12 weeks    PT Treatment/Interventions  Cryotherapy;Electrical Stimulation;Iontophoresis 28m/ml Dexamethasone;Moist Heat;Ultrasound;DME Instruction;Gait training;Stair training;Functional mobility training;Therapeutic activities;Therapeutic exercise;Balance training;Neuromuscular re-education;Patient/family education;Manual techniques;Scar mobilization;Passive range of motion;Dry needling;Taping;Joint Manipulations;ADLs/Self Care Home Management;Aquatic Therapy;Canalith Repostioning;Vestibular;Spinal Manipulations    PT Next Visit Plan  progress strength and ROM per protocol    PT Home Exercise Plan  [email or web address]    Consulted and Agree with Plan of Care  Patient       Patient will benefit from skilled therapeutic intervention in order to improve the following deficits  and impairments:  Decreased balance, Decreased endurance, Decreased range of motion, Pain, Decreased strength, Abnormal gait  Visit Diagnosis: Pain in right hip  Muscle weakness (generalized)     Problem List Patient Active Problem List   Diagnosis Date Noted  . Osteonecrosis of right hip ([place]) 11/12/2018  . Vitamin D deficiency 02/25/2018  . Anemia, unspecified 01/08/2017  . Chronic pain 10/24/2015  .  Hot flashes 10/24/2015  . Mood changes 10/24/2015  . Migraine without aura and without status migrainosus, not intractable 10/24/2015  . Metabolic syndrome 18/09/7042  . Dyslipidemia 10/24/2015  . Allergic rhinitis, seasonal 05/17/2015  . Asthma, mild intermittent 08/08/2014  . Chronic insomnia 08/08/2014  . DDD (degenerative disc disease), lumbar 08/08/2014  . Mood disorder ([place]) 08/08/2014  . Gastro-esophageal reflux disease without esophagitis 08/08/2014  . H/O total hysterectomy 08/08/2014  . Irritable bowel syndrome with diarrhea 08/08/2014  . Osteoarthritis of right hip 08/08/2014  . Adult BMI 30+ 08/08/2014  . SBO (spina bifida occulta) 08/08/2014   Janna Arch, PT, DPT   12/31/2018, 12:39 PM  [place] MAIN [facility] SERVICES [address] [place], [place], [address] Phone: [phone]   Fax:  [phone]  Name: Shelby Henson MRN: [id number] Date of Birth: 12-05-1970

## 2019-01-05 ENCOUNTER — Ambulatory Visit: Payer: No Typology Code available for payment source

## 2019-01-05 ENCOUNTER — Encounter: Payer: Self-pay | Admitting: Physical Therapy

## 2019-01-05 ENCOUNTER — Other Ambulatory Visit: Payer: Self-pay

## 2019-01-05 DIAGNOSIS — M25551 Pain in right hip: Secondary | ICD-10-CM | POA: Diagnosis not present

## 2019-01-05 DIAGNOSIS — M6281 Muscle weakness (generalized): Secondary | ICD-10-CM

## 2019-01-05 NOTE — Therapy (Signed)
[place] MAIN [facility] SERVICES [address] [place], [place], [address] Phone: [phone]   Fax:  [phone]  Physical Therapy Treatment  Patient Details  Name: Shelby Henson MRN: [id number] Date of Birth: 12/25/1970 Referring Provider (PT): Dr. Harlow Mares   Encounter Date: 01/05/2019  PT End of Session - 01/05/19 0937    Visit Number  11    Number of Visits  25    Date for PT Re-Evaluation  02/18/19    Authorization Type  1/10 PN 12/31/18    PT Start Time  0930    PT Stop Time  1015    PT Time Calculation (min)  45 min    Equipment Utilized During Treatment  Gait belt    Activity Tolerance  Patient tolerated treatment well    Behavior During Therapy  [facility] for tasks assessed/performed       Past Medical History:  Diagnosis Date  . Anemia   . Asthma    well controlled  . Complication of anesthesia   . Depression   . GERD (gastroesophageal reflux disease)    occ  . Migraine   . Osteoarthritis   . PONV (postoperative nausea and vomiting)    with c-section x 1    Past Surgical History:  Procedure Laterality Date  . ABDOMINAL HYSTERECTOMY    . ABLATION    . BREAST CYST ASPIRATION Left   . CESAREAN SECTION    . CESAREAN SECTION    . CESAREAN SECTION    . LAPAROSCOPY  2018  . LEEP    . TOTAL HIP ARTHROPLASTY Right 11/12/2018   Procedure: TOTAL HIP ARTHROPLASTY ANTERIOR APPROACH;  Surgeon: Lovell Sheehan, MD;  Location: [facility] ORS;  Service: Orthopedics;  Laterality: Right;  . TOTAL VAGINAL HYSTERECTOMY      There were no vitals filed for this visit.  Subjective Assessment - 01/05/19 0934    Subjective  Patient reported that she had a painful/sore weekend. Attempted her exercises and stretches without relief from pain.    Pertinent History  Pt presents s/p right anterior total hip arthroplasty on 11/12/18.  She has completed 2 sessions of PT at another facility, however, her treatment sessions were not one-on-one, and she was not  getting the feedback she was looking for on her exercises.    Limitations  Sitting;Standing;Walking;Lifting;House hold activities    How long can you sit comfortably?  45 min    How long can you stand comfortably?  30 min    How long can you walk comfortably?  15 min    Patient Stated Goals  would like to be able to go back to the gym and have no pain; would like to be able to cook and clean again    Currently in Pain?  Yes    Pain Score  5     Pain Location  Hip    Pain Orientation  Right    Pain Descriptors / Indicators  Aching    Pain Onset  More than a month ago       Therapeutic Exercise   octane fitness Lvl 2, 4 minutes for warm up, education on positioning Patient prone:  PT performed R prone quad/hip flexor stretch with slight abduction 4x30s, put small towel under right knee to facilitate better stretch;    Alternate knee flexion x20 bilaterally with towel roll under RLE to facilitate better hip flexor stretch on RLE;   Prone hamstring curl with 3lb ankle weight, 20x  Butterfly stretch 3x30seconds with both knees abducted in supine, cued to avoid painful motion, pt stated it "felt good"  Standing on foam surface: Step taps to 4" step with decreasing UE support to no support 2x10  Step taps laterally 2x10 bilaterally, cues for core activation pelvic alignment, 1 rail support    Single leg stance 3x20secs   bosu ball  Forward  lunges 10x each LE; focus on slow muscle activation and controlled eccentric motion with BUE support.    Off step hip dips and return to neutral for control/strengthening: (good to try next visit as well) x10-15 times ea side   Pt educated throughout session about proper posture and technique with exercises. Improved exercise technique, movement at target joints, use of target muscles after min to mod verbal, visual, tactile cues.     Pt response/clinical impression: The patient was most challenged on maintaining pelvic and hip alignment during  single stance activities. Exhibited R hip weakness, and complained of R hip stiffness but not increased pain. The patient would benefit from further skilled PT intervention to continue to progress towards goals.      PT Education - 01/05/19 0936    Education Details  exercise technique/body mechanics    Person(s) Educated  Patient    Methods  Explanation;Demonstration;Tactile cues;Verbal cues    Comprehension  Verbalized understanding;Returned demonstration;Verbal cues required;Tactile cues required       PT Short Term Goals - 12/31/18 1035      PT SHORT TERM GOAL #1   Title  Pt will be independent with HEP in order to improve strength and decrease back pain in order to improve pain-free function at home and work.    Baseline  12/2: HEP compliant    Time  6    Period  Weeks    Status  Partially Met    Target Date  01/07/19        PT Long Term Goals - 12/31/18 1052      PT LONG TERM GOAL #1   Title  Pt will increase her LEFS to greater than 60/80 in order to demonstrate a clinically significant change in order to improve her ability to perform ADLs and IADLs with less difficulty and to improve her quality of life.    Baseline  11/26/18: 9/80 12/2: 25/80    Time  12    Period  Weeks    Status  Partially Met    Target Date  02/18/19      PT LONG TERM GOAL #2   Title  Pt will improve her R hip ROM to within 10 degrees of her L hip ROM  in order to improve her ability to perform ADLs and IADLs with less difficulty and to improve her quality of life.    Baseline  11/26/18: 28 degrees of R hip IR, 12 degrees of R hip ER, 108 degrees of hip flexion, 20 deg of hip abduction, and -2 deg of hip extension; 12/03/18: L hip assessed today for comparison: L hip IR 45 deg, L hip ER 40 deg, L hip flexion 124 deg, L hip abduction 40 deg, PROM form hip extension L 25 deg, R 8 deg 12/2: R hip : flexion 114, abductoin 31 degrees, extension 11 degrees    Time  12    Period  Weeks    Status   Partially Met    Target Date  02/18/19      PT LONG TERM GOAL #3   Title  Pt  will improve her R hip strength to a 4/5 in order to improve her ability to perform ADLs and IADLs with less difficulty and to improve her quality of life.    Baseline  11/26/18: 3+/5 hip flexion, 2+/5 hip ER (limited by pain and ROM), 3+/5 on hip IR, 3+/5 on hip abduction, 2+/5 on hip extension, 5/5 on knee extension, and 4/5 on knee flexion; 12/03/18: L hip strength assesessed today for comparison: 4+/5 Hip flexion, 5/5 Hip ER in sitting, 5/5 Hip IR in sitting, 4/5 Hip abduction (reports pain on R hip from sidelying position), 4/5 Hip extension, 5/5 Knee extension, 4/5 Knee flexion 12/2: R hip flexion, abduction, and adduction 4-/5 hip extension 3-/5    Time  12    Period  Weeks    Status  Partially Met    Target Date  02/18/19      PT LONG TERM GOAL #4   Title  Pt will decrease worst hip pain as reported on NPRS by at least 3 points in order to demonstrate clinically significant reduction in hip pain.    Baseline  11/23/18: 9/10 12/2: 5/10    Time  12    Period  Weeks    Status  Partially Met    Target Date  02/18/19            Plan - 01/05/19 1239    Clinical Impression Statement  The patient was most challenged on maintaining pelvic and hip alignment during single stance activities. Exhibited R hip weakness, and ocmplained of R hip stiffness but not increased pain. The patient would benefit from further skilled PT intervention to continue to progress towards goals.    Personal Factors and Comorbidities  Comorbidity 3+    Comorbidities  DDD, migraine, IBS,    Examination-Activity Limitations  Bend;Lift;Squat;Caring for Others;Locomotion Level;Stairs;Carry;Stand    Examination-Participation Restrictions  Interpersonal Relationship;Yard Work;Cleaning;Laundry;Community Activity;Shop;Driving;Meal Prep    Rehab Potential  Good    PT Frequency  2x / week    PT Duration  12 weeks    PT Treatment/Interventions   Cryotherapy;Electrical Stimulation;Iontophoresis 59m/ml Dexamethasone;Moist Heat;Ultrasound;DME Instruction;Gait training;Stair training;Functional mobility training;Therapeutic activities;Therapeutic exercise;Balance training;Neuromuscular re-education;Patient/family education;Manual techniques;Scar mobilization;Passive range of motion;Dry needling;Taping;Joint Manipulations;ADLs/Self Care Home Management;Aquatic Therapy;Canalith Repostioning;Vestibular;Spinal Manipulations    PT Next Visit Plan  progress strength and ROM per protocol    PT Home Exercise Plan  [email or web address]    Consulted and Agree with Plan of Care  Patient       Patient will benefit from skilled therapeutic intervention in order to improve the following deficits and impairments:  Decreased balance, Decreased endurance, Decreased range of motion, Pain, Decreased strength, Abnormal gait  Visit Diagnosis: Pain in right hip  Muscle weakness (generalized)     Problem List Patient Active Problem List   Diagnosis Date Noted  . Osteonecrosis of right hip ([place] 11/12/2018  . Vitamin D deficiency 02/25/2018  . Anemia, unspecified 01/08/2017  . Chronic pain 10/24/2015  . Hot flashes 10/24/2015  . Mood changes 10/24/2015  . Migraine without aura and without status migrainosus, not intractable 10/24/2015  . Metabolic syndrome 046/27/0350 . Dyslipidemia 10/24/2015  . Allergic rhinitis, seasonal 05/17/2015  . Asthma, mild intermittent 08/08/2014  . Chronic insomnia 08/08/2014  . DDD (degenerative disc disease), lumbar 08/08/2014  . Mood disorder ([place] 08/08/2014  . Gastro-esophageal reflux disease without esophagitis 08/08/2014  . H/O total hysterectomy 08/08/2014  . Irritable bowel syndrome with diarrhea 08/08/2014  . Osteoarthritis of right hip 08/08/2014  . Adult BMI  30+ 08/08/2014  . SBO (spina bifida occulta) 08/08/2014    Lieutenant Diego PT, DPT 12:47 PM,01/05/19 [place] MAIN [facility] SERVICES [address] [place], [place], [address] Phone: [phone]   Fax:  [phone]  Name: Shelby Henson MRN: [id number] Date of Birth: 04/01/1970

## 2019-01-07 ENCOUNTER — Ambulatory Visit: Payer: No Typology Code available for payment source | Admitting: Physical Therapy

## 2019-01-07 ENCOUNTER — Other Ambulatory Visit: Payer: Self-pay

## 2019-01-07 ENCOUNTER — Encounter: Payer: Self-pay | Admitting: Physical Therapy

## 2019-01-07 DIAGNOSIS — M25551 Pain in right hip: Secondary | ICD-10-CM | POA: Diagnosis not present

## 2019-01-07 DIAGNOSIS — M6281 Muscle weakness (generalized): Secondary | ICD-10-CM

## 2019-01-07 NOTE — Therapy (Signed)
[place] MAIN [facility] SERVICES [address] [place], [place], [address] Phone: [phone]   Fax:  [phone]  Physical Therapy Treatment  Patient Details  Name: Shelby Henson MRN: [id number] Date of Birth: Aug 06, 1970 Referring Provider (PT): Dr. Harlow Mares   Encounter Date: 01/07/2019  PT End of Session - 01/07/19 0929    Visit Number  12    Number of Visits  25    Date for PT Re-Evaluation  02/18/19    Authorization Type  1/10 PN 12/31/18    PT Start Time  0930    PT Stop Time  1015    PT Time Calculation (min)  45 min    Equipment Utilized During Treatment  Gait belt    Activity Tolerance  Patient tolerated treatment well    Behavior During Therapy  [facility] for tasks assessed/performed       Past Medical History:  Diagnosis Date  . Anemia   . Asthma    well controlled  . Complication of anesthesia   . Depression   . GERD (gastroesophageal reflux disease)    occ  . Migraine   . Osteoarthritis   . PONV (postoperative nausea and vomiting)    with c-section x 1    Past Surgical History:  Procedure Laterality Date  . ABDOMINAL HYSTERECTOMY    . ABLATION    . BREAST CYST ASPIRATION Left   . CESAREAN SECTION    . CESAREAN SECTION    . CESAREAN SECTION    . LAPAROSCOPY  2018  . LEEP    . TOTAL HIP ARTHROPLASTY Right 11/12/2018   Procedure: TOTAL HIP ARTHROPLASTY ANTERIOR APPROACH;  Surgeon: Lovell Sheehan, MD;  Location: [facility] ORS;  Service: Orthopedics;  Laterality: Right;  . TOTAL VAGINAL HYSTERECTOMY      There were no vitals filed for this visit.  Subjective Assessment - 01/07/19 0937    Subjective  Patient reports doing well. She reports continued stiffness in the morning especially with colder weather. She reports this morning being able to negotiate stairs reciprocally with minimal difficulty which is an improvement.    Pertinent History  Pt presents s/p right anterior total hip arthroplasty on 11/12/18.  She has completed 2  sessions of PT at another facility, however, her treatment sessions were not one-on-one, and she was not getting the feedback she was looking for on her exercises.    Limitations  Sitting;Standing;Walking;Lifting;House hold activities    How long can you sit comfortably?  45 min    How long can you stand comfortably?  30 min    How long can you walk comfortably?  15 min    Patient Stated Goals  would like to be able to go back to the gym and have no pain; would like to be able to cook and clean again    Currently in Pain?  Yes    Pain Score  2     Pain Location  Hip    Pain Orientation  Right    Pain Descriptors / Indicators  Aching;Sore    Pain Type  Surgical pain    Pain Onset  More than a month ago    Pain Frequency  Constant    Aggravating Factors   unsure    Pain Relieving Factors  ice    Effect of Pain on Daily Activities  pushes through the pain;    Multiple Pain Sites  No  Therapeutic Exercise  Octane fitness Lvl 2, 4 minutes for warm up, education on positioning  Patient prone:  With towel roll under right knee for better hip extension:  -Prone alternate knee flexion x10 reps with cues to slow down for better stretch;  PT performedR prone quad/hip flexorstretchwith slight abduction 4x30s, put small towel under right knee to facilitate better stretch;   Prone with 3# ankle weight -alternate hamstring curl x10 bilaterally; -hip extension x15 bilaterally; -gluteal max hip extension x15 bilaterally; Patient required min-moderate verbal/tactile cues for correct exercise technique including cues to avoid trunk rotation for better hip strengthening;   Pt qped: -hip abduction with knee flexed (fire hydrant) x10 reps each LE with cues for core stabilization to avoid trunk rotation;   Tall kneeling squat to tall kneeling x15 reps with cues for posterior pelvic tilt for better core stabilization and trunk control;   1/2 kneel quad/hip flexor stretch 30 sec  hold x2 reps with RLE;   Standing at matrix: -hip extension SLR 7.5# x10 each LE -gluteal max hip extension donkey kicks 7.5# x10 each LE:  -hip abduction SLR 5# x15 each LE Required HHA to chair and min VCs for proper positioning for optimal strengthening;    Off step hip dips and return to neutral for gluteal control/strengthening: x10-15 times ea side  Instructed patient in stretches at end of session to reduce delayed onset muscle soreness: Patient supine: Butterfly stretch 3x30seconds with both knees abducted in supine, cued to avoid painful motion, pt stated it "felt good"  PT performed long axis distraction 20 sec pull, 10 sec rest with passive hip abduction/adduction x3-4 min;    Pt educated throughout session about proper posture and technique with exercises. Improved exercise technique, movement at target joints, use of target muscles after min to mod verbal, visual, tactile cues.  Pt response/clinical impression: The patient was most challenged on maintaining pelvic and hip alignment during single stance activities. Exhibited R hip weakness, and complained of R hip stiffness but not increased pain. She reports feeling good at end of session;  The patient would benefit from further skilled PT intervention to continue to progress towards goals.                     PT Education - 01/07/19 0929    Education Details  strength/exercise; HEP, body mechanics;    Person(s) Educated  Patient    Methods  Explanation;Verbal cues    Comprehension  Verbalized understanding;Returned demonstration;Verbal cues required;Need further instruction       PT Short Term Goals - 12/31/18 1035      PT SHORT TERM GOAL #1   Title  Pt will be independent with HEP in order to improve strength and decrease back pain in order to improve pain-free function at home and work.    Baseline  12/2: HEP compliant    Time  6    Period  Weeks    Status  Partially Met    Target Date   01/07/19        PT Long Term Goals - 12/31/18 1052      PT LONG TERM GOAL #1   Title  Pt will increase her LEFS to greater than 60/80 in order to demonstrate a clinically significant change in order to improve her ability to perform ADLs and IADLs with less difficulty and to improve her quality of life.    Baseline  11/26/18: 9/80 12/2: 25/80    Time  12  Period  Weeks    Status  Partially Met    Target Date  02/18/19      PT LONG TERM GOAL #2   Title  Pt will improve her R hip ROM to within 10 degrees of her L hip ROM  in order to improve her ability to perform ADLs and IADLs with less difficulty and to improve her quality of life.    Baseline  11/26/18: 28 degrees of R hip IR, 12 degrees of R hip ER, 108 degrees of hip flexion, 20 deg of hip abduction, and -2 deg of hip extension; 12/03/18: L hip assessed today for comparison: L hip IR 45 deg, L hip ER 40 deg, L hip flexion 124 deg, L hip abduction 40 deg, PROM form hip extension L 25 deg, R 8 deg 12/2: R hip : flexion 114, abductoin 31 degrees, extension 11 degrees    Time  12    Period  Weeks    Status  Partially Met    Target Date  02/18/19      PT LONG TERM GOAL #3   Title  Pt will improve her R hip strength to a 4/5 in order to improve her ability to perform ADLs and IADLs with less difficulty and to improve her quality of life.    Baseline  11/26/18: 3+/5 hip flexion, 2+/5 hip ER (limited by pain and ROM), 3+/5 on hip IR, 3+/5 on hip abduction, 2+/5 on hip extension, 5/5 on knee extension, and 4/5 on knee flexion; 12/03/18: L hip strength assesessed today for comparison: 4+/5 Hip flexion, 5/5 Hip ER in sitting, 5/5 Hip IR in sitting, 4/5 Hip abduction (reports pain on R hip from sidelying position), 4/5 Hip extension, 5/5 Knee extension, 4/5 Knee flexion 12/2: R hip flexion, abduction, and adduction 4-/5 hip extension 3-/5    Time  12    Period  Weeks    Status  Partially Met    Target Date  02/18/19      PT LONG TERM GOAL #4    Title  Pt will decrease worst hip pain as reported on NPRS by at least 3 points in order to demonstrate clinically significant reduction in hip pain.    Baseline  11/23/18: 9/10 12/2: 5/10    Time  12    Period  Weeks    Status  Partially Met    Target Date  02/18/19            Plan - 01/07/19 0948    Clinical Impression Statement  Patient motivated and participated well within session. Instructed patient in advanced LE strengthening exercise to challenge hip control and stabilization. She does require min VCs for proper positioning and exercise technique for optimal muscle strengthening and motor control; Patient would benefit from additional skilled PT Intervention to improve strength, mobility and gait safety;    Personal Factors and Comorbidities  Comorbidity 3+    Comorbidities  DDD, migraine, IBS,    Examination-Activity Limitations  Bend;Lift;Squat;Caring for Others;Locomotion Level;Stairs;Carry;Stand    Examination-Participation Restrictions  Interpersonal Relationship;Yard Work;Cleaning;Laundry;Community Activity;Shop;Driving;Meal Prep    Rehab Potential  Good    PT Frequency  2x / week    PT Duration  12 weeks    PT Treatment/Interventions  Cryotherapy;Electrical Stimulation;Iontophoresis 60m/ml Dexamethasone;Moist Heat;Ultrasound;DME Instruction;Gait training;Stair training;Functional mobility training;Therapeutic activities;Therapeutic exercise;Balance training;Neuromuscular re-education;Patient/family education;Manual techniques;Scar mobilization;Passive range of motion;Dry needling;Taping;Joint Manipulations;ADLs/Self Care Home Management;Aquatic Therapy;Canalith Repostioning;Vestibular;Spinal Manipulations    PT Next Visit Plan  progress strength and ROM per protocol  PT Home Exercise Plan  [email or web address]    Consulted and Agree with Plan of Care  Patient       Patient will benefit from skilled therapeutic intervention in order to improve the following deficits and  impairments:  Decreased balance, Decreased endurance, Decreased range of motion, Pain, Decreased strength, Abnormal gait  Visit Diagnosis: Pain in right hip  Muscle weakness (generalized)     Problem List Patient Active Problem List   Diagnosis Date Noted  . Osteonecrosis of right hip ([place]) 11/12/2018  . Vitamin D deficiency 02/25/2018  . Anemia, unspecified 01/08/2017  . Chronic pain 10/24/2015  . Hot flashes 10/24/2015  . Mood changes 10/24/2015  . Migraine without aura and without status migrainosus, not intractable 10/24/2015  . Metabolic syndrome 83/81/8403  . Dyslipidemia 10/24/2015  . Allergic rhinitis, seasonal 05/17/2015  . Asthma, mild intermittent 08/08/2014  . Chronic insomnia 08/08/2014  . DDD (degenerative disc disease), lumbar 08/08/2014  . Mood disorder ([place]) 08/08/2014  . Gastro-esophageal reflux disease without esophagitis 08/08/2014  . H/O total hysterectomy 08/08/2014  . Irritable bowel syndrome with diarrhea 08/08/2014  . Osteoarthritis of right hip 08/08/2014  . Adult BMI 30+ 08/08/2014  . SBO (spina bifida occulta) 08/08/2014    , PT, DPT 01/07/2019, 10:15 AM  [place] MAIN [facility] SERVICES [address] [place], [place], [address] Phone: [phone]   Fax:  [phone]  Name: Shelby Henson MRN: [id number] Date of Birth: 1970/03/15

## 2019-01-12 ENCOUNTER — Ambulatory Visit: Payer: No Typology Code available for payment source

## 2019-01-12 ENCOUNTER — Other Ambulatory Visit: Payer: Self-pay

## 2019-01-12 ENCOUNTER — Encounter: Payer: Self-pay | Admitting: Physical Therapy

## 2019-01-12 DIAGNOSIS — M25551 Pain in right hip: Secondary | ICD-10-CM

## 2019-01-12 DIAGNOSIS — M6281 Muscle weakness (generalized): Secondary | ICD-10-CM

## 2019-01-12 NOTE — Therapy (Signed)
[place] MAIN [facility] SERVICES [address] [place], [place], [address] Phone: [phone]   Fax:  [phone]  Physical Therapy Treatment  Patient Details  Name: Shelby Henson MRN: [id number] Date of Birth: [birth year] Referring Provider (PT): Dr. Harlow Mares   Encounter Date: 01/12/2019  PT End of Session - 01/12/19 0938    Visit Number  13    Number of Visits  25    PT Start Time  0930    PT Stop Time  1015    PT Time Calculation (min)  45 min    Equipment Utilized During Treatment  Gait belt    Activity Tolerance  Patient tolerated treatment well    Behavior During Therapy  [facility] for tasks assessed/performed       Past Medical History:  Diagnosis Date  . Anemia   . Asthma    well controlled  . Complication of anesthesia   . Depression   . GERD (gastroesophageal reflux disease)    occ  . Migraine   . Osteoarthritis   . PONV (postoperative nausea and vomiting)    with c-section x 1    Past Surgical History:  Procedure Laterality Date  . ABDOMINAL HYSTERECTOMY    . ABLATION    . BREAST CYST ASPIRATION Left   . CESAREAN SECTION    . CESAREAN SECTION    . CESAREAN SECTION    . LAPAROSCOPY  2018  . LEEP    . TOTAL HIP ARTHROPLASTY Right 11/12/2018   Procedure: TOTAL HIP ARTHROPLASTY ANTERIOR APPROACH;  Surgeon: Lovell Sheehan, MD;  Location: [facility] ORS;  Service: Orthopedics;  Laterality: Right;  . TOTAL VAGINAL HYSTERECTOMY      There were no vitals filed for this visit.  Subjective Assessment - 01/12/19 0930    Subjective  Pt reports typical stiffness in R anterior hip this morning    Limitations  Sitting;Standing;Walking;Lifting;House hold activities    Currently in Pain?  Yes    Pain Score  4     Pain Location  Hip       Octane fitness Lvl 2, 4 minutes for warm up, education on positioning   Patient prone:  With towel roll under right knee for better hip extension:  -Prone alternate knee flexion x10 reps with cues to  slow down for better stretch;   PT performed R prone quad/hip flexor stretch with slight abduction 4x30s, put small towel under right knee to facilitate better stretch;    Prone with 4# ankle weight -alternate hamstring curl 2 x15 bilaterally; -hip extension 4# 2 x10 bilaterally; -gluteal max hip extension x15 bilaterally; Patient required min-moderate verbal/tactile cues for correct exercise technique including cues to avoid trunk rotation for better hip strengthening;    Pt qped: -hip abduction with knee flexed (fire hydrant) 2 x10 reps each LE with cues for core stabilization to avoid trunk rotation;    Tall kneeling squat to tall kneeling x15 reps with cues for posterior pelvic tilt for better core stabilization and trunk control;    1/2 kneel quad/hip flexor stretch 30 sec hold x2 reps with RLE  Chair squat to SLS on R for 3-5 sec (for strength/balance): 2x8 Pt ed to review HEP  PT Education - 01/12/19 1213    Education Details  HEP, body mechanics    Person(s) Educated  Patient    Methods  Explanation;Demonstration    Comprehension  Verbalized understanding;Returned demonstration      PT Short Term Goals -  12/31/18 1035      PT SHORT TERM GOAL #1   Title  Pt will be independent with HEP in order to improve strength and decrease back pain in order to improve pain-free function at home and work.    Baseline  12/2: HEP compliant    Time  6    Period  Weeks    Status  Partially Met    Target Date  01/07/19        PT Long Term Goals - 12/31/18 1052      PT LONG TERM GOAL #1   Title  Pt will increase her LEFS to greater than 60/80 in order to demonstrate a clinically significant change in order to improve her ability to perform ADLs and IADLs with less difficulty and to improve her quality of life.    Baseline  11/26/18: 9/80 12/2: 25/80    Time  12    Period  Weeks    Status  Partially Met    Target Date  02/18/19      PT LONG TERM GOAL #2   Title  Pt will improve  her R hip ROM to within 10 degrees of her L hip ROM  in order to improve her ability to perform ADLs and IADLs with less difficulty and to improve her quality of life.    Baseline  11/26/18: 28 degrees of R hip IR, 12 degrees of R hip ER, 108 degrees of hip flexion, 20 deg of hip abduction, and -2 deg of hip extension; 12/03/18: L hip assessed today for comparison: L hip IR 45 deg, L hip ER 40 deg, L hip flexion 124 deg, L hip abduction 40 deg, PROM form hip extension L 25 deg, R 8 deg 12/2: R hip : flexion 114, abductoin 31 degrees, extension 11 degrees    Time  12    Period  Weeks    Status  Partially Met    Target Date  02/18/19      PT LONG TERM GOAL #3   Title  Pt will improve her R hip strength to a 4/5 in order to improve her ability to perform ADLs and IADLs with less difficulty and to improve her quality of life.    Baseline  11/26/18: 3+/5 hip flexion, 2+/5 hip ER (limited by pain and ROM), 3+/5 on hip IR, 3+/5 on hip abduction, 2+/5 on hip extension, 5/5 on knee extension, and 4/5 on knee flexion; 12/03/18: L hip strength assesessed today for comparison: 4+/5 Hip flexion, 5/5 Hip ER in sitting, 5/5 Hip IR in sitting, 4/5 Hip abduction (reports pain on R hip from sidelying position), 4/5 Hip extension, 5/5 Knee extension, 4/5 Knee flexion 12/2: R hip flexion, abduction, and adduction 4-/5 hip extension 3-/5    Time  12    Period  Weeks    Status  Partially Met    Target Date  02/18/19      PT LONG TERM GOAL #4   Title  Pt will decrease worst hip pain as reported on NPRS by at least 3 points in order to demonstrate clinically significant reduction in hip pain.    Baseline  11/23/18: 9/10 12/2: 5/10    Time  12    Period  Weeks    Status  Partially Met    Target Date  02/18/19            Plan - 01/12/19 1215    Clinical Impression Statement  Pt tolerated session well; she  is motivated to participate in therapy.  Continues to require VCs for positioning and proper exercise  technique during hip strengthening.    Personal Factors and Comorbidities  Comorbidity 3+    Comorbidities  DDD, migraine, IBS,    Examination-Activity Limitations  Bend;Lift;Squat;Caring for Others;Locomotion Level;Stairs;Carry;Stand    Examination-Participation Restrictions  Interpersonal Relationship;Yard Work;Cleaning;Laundry;Community Activity;Shop;Driving;Meal Prep    Rehab Potential  Good    PT Frequency  2x / week    PT Duration  12 weeks    PT Treatment/Interventions  Cryotherapy;Electrical Stimulation;Iontophoresis 63m/ml Dexamethasone;Moist Heat;Ultrasound;DME Instruction;Gait training;Stair training;Functional mobility training;Therapeutic activities;Therapeutic exercise;Balance training;Neuromuscular re-education;Patient/family education;Manual techniques;Scar mobilization;Passive range of motion;Dry needling;Taping;Joint Manipulations;ADLs/Self Care Home Management;Aquatic Therapy;Canalith Repostioning;Vestibular;Spinal Manipulations    PT Next Visit Plan  progress strength and ROM per protocol    PT Home Exercise Plan  [email or web address]    Consulted and Agree with Plan of Care  Patient       Patient will benefit from skilled therapeutic intervention in order to improve the following deficits and impairments:  Decreased balance, Decreased endurance, Decreased range of motion, Pain, Decreased strength, Abnormal gait  Visit Diagnosis: Pain in right hip  Muscle weakness (generalized)     Problem List Patient Active Problem List   Diagnosis Date Noted  . Pain in right hip 01/12/2019  . Osteonecrosis of right hip ([place] 11/12/2018  . Vitamin D deficiency 02/25/2018  . Anemia, unspecified 01/08/2017  . Chronic pain 10/24/2015  . Hot flashes 10/24/2015  . Mood changes 10/24/2015  . Migraine without aura and without status migrainosus, not intractable 10/24/2015  . Metabolic syndrome 010/25/8527 . Dyslipidemia 10/24/2015  . Allergic rhinitis, seasonal 05/17/2015  . Asthma, mild  intermittent 08/08/2014  . Chronic insomnia 08/08/2014  . DDD (degenerative disc disease), lumbar 08/08/2014  . Mood disorder ([place] 08/08/2014  . Gastro-esophageal reflux disease without esophagitis 08/08/2014  . H/O total hysterectomy 08/08/2014  . Irritable bowel syndrome with diarrhea 08/08/2014  . Osteoarthritis of right hip 08/08/2014  . Adult BMI 30+ 08/08/2014  . SBO (spina bifida occulta) 08/08/2014   RMerdis Delay PT, DPT Physical Therapist - [phone]   RPincus Badder12/14/2020, 12:16 PM  [place]MAIN RDigestive Diagnostic Center IncSERVICES [address] [address]Phone: [phone]  Fax:  [phone] Name: Shelby MANGESMRN: [id number] of Birth: 303/06/1970

## 2019-01-14 ENCOUNTER — Encounter: Payer: Self-pay | Admitting: Physical Therapy

## 2019-01-14 ENCOUNTER — Other Ambulatory Visit: Payer: Self-pay

## 2019-01-14 ENCOUNTER — Ambulatory Visit: Payer: No Typology Code available for payment source

## 2019-01-14 DIAGNOSIS — M6281 Muscle weakness (generalized): Secondary | ICD-10-CM

## 2019-01-14 DIAGNOSIS — M25551 Pain in right hip: Secondary | ICD-10-CM

## 2019-01-14 NOTE — Therapy (Addendum)
[place] MAIN [facility] SERVICES [address] [place], [place], [address] Phone: [phone]   Fax:  [phone]  Physical Therapy Treatment  Patient Details  Name: Shelby Henson MRN: [id number] Date of Birth: 1970/06/09 Referring Provider (PT): Dr. Harlow Mares   Encounter Date: 01/14/2019  PT End of Session - 01/14/19 0921    Visit Number  14    Number of Visits  25    PT Start Time  0925    PT Stop Time  1010    PT Time Calculation (min)  45 min    Equipment Utilized During Treatment  Gait belt    Activity Tolerance  Patient tolerated treatment well    Behavior During Therapy  [facility] for tasks assessed/performed       Past Medical History:  Diagnosis Date  . Anemia   . Asthma    well controlled  . Complication of anesthesia   . Depression   . GERD (gastroesophageal reflux disease)    occ  . Migraine   . Osteoarthritis   . PONV (postoperative nausea and vomiting)    with c-section x 1    Past Surgical History:  Procedure Laterality Date  . ABDOMINAL HYSTERECTOMY    . ABLATION    . BREAST CYST ASPIRATION Left   . CESAREAN SECTION    . CESAREAN SECTION    . CESAREAN SECTION    . LAPAROSCOPY  2018  . LEEP    . TOTAL HIP ARTHROPLASTY Right 11/12/2018   Procedure: TOTAL HIP ARTHROPLASTY ANTERIOR APPROACH;  Surgeon: Lovell Sheehan, MD;  Location: [facility] ORS;  Service: Orthopedics;  Laterality: Right;  . TOTAL VAGINAL HYSTERECTOMY      There were no vitals filed for this visit.  Subjective Assessment - 01/14/19 0920    Subjective  Pt reports she is feeling stiff and sore in the groin this morning; yesterday she felt like she was sore too and she did not drive because of it.  She didn't use cold pack or hot pack at home for pain control.  She is able to do stair at home to get in her house with reciprocal gait pattern occasionally- it "depends on the day".  She states overall she is fearful and lacks confidence with her R LE when she is  moving around at home.    Limitations  Sitting;Standing;Walking;Lifting;House hold activities    Currently in Pain?  Yes    Pain Score  5     Pain Location  Hip        Today's Treatment:  Warm up: (history taken while warming up) Octane L2 x 5 min  Exercises: Chair squat 2x8 Chair squat holding medicine ball in front(yellow): x15 Chair squat with med ball and yellow band around distal thigh: x15  Standing abd walk (yellow band) 6 laps (no hands) in parallel bars- focused on upright trunk posture and pelvis neutral (not anteriorly tilted) during this exercise  SLS RLE 3x30 sec- focused on upright trunk posture and pelvis neutral  Standing hip extension 2x15 ea LE  R Lat step down on stairs with b/l UE hold on rails 2x10  Leg press: R LE 65# 3x12  Bridges: x15 with PT man resistance for hip abd/ER throughout movement (to promote deep hip stabilizer activation) and bridge with 5 sec hold x3 at end  Ambulating on flat surface emphasizing glute max on extension phase  Manual Hip flexor/quad stretches in prone at end of session: 4x30 sec with  PT   PT Education - 01/14/19 0921    Education Details  strengthening, body mechanics,    Person(s) Educated  Patient    Methods  Explanation;Verbal cues    Comprehension  Verbalized understanding;Returned demonstration       PT Short Term Goals - 12/31/18 1035      PT SHORT TERM GOAL #1   Title  Pt will be independent with HEP in order to improve strength and decrease back pain in order to improve pain-free function at home and work.    Baseline  12/2: HEP compliant    Time  6    Period  Weeks    Status  Partially Met    Target Date  01/07/19        PT Long Term Goals - 12/31/18 1052      PT LONG TERM GOAL #1   Title  Pt will increase her LEFS to greater than 60/80 in order to demonstrate a clinically significant change in order to improve her ability to perform ADLs and IADLs with less difficulty and to improve her quality  of life.    Baseline  11/26/18: 9/80 12/2: 25/80    Time  12    Period  Weeks    Status  Partially Met    Target Date  02/18/19      PT LONG TERM GOAL #2   Title  Pt will improve her R hip ROM to within 10 degrees of her L hip ROM  in order to improve her ability to perform ADLs and IADLs with less difficulty and to improve her quality of life.    Baseline  11/26/18: 28 degrees of R hip IR, 12 degrees of R hip ER, 108 degrees of hip flexion, 20 deg of hip abduction, and -2 deg of hip extension; 12/03/18: L hip assessed today for comparison: L hip IR 45 deg, L hip ER 40 deg, L hip flexion 124 deg, L hip abduction 40 deg, PROM form hip extension L 25 deg, R 8 deg 12/2: R hip : flexion 114, abductoin 31 degrees, extension 11 degrees    Time  12    Period  Weeks    Status  Partially Met    Target Date  02/18/19      PT LONG TERM GOAL #3   Title  Pt will improve her R hip strength to a 4/5 in order to improve her ability to perform ADLs and IADLs with less difficulty and to improve her quality of life.    Baseline  11/26/18: 3+/5 hip flexion, 2+/5 hip ER (limited by pain and ROM), 3+/5 on hip IR, 3+/5 on hip abduction, 2+/5 on hip extension, 5/5 on knee extension, and 4/5 on knee flexion; 12/03/18: L hip strength assesessed today for comparison: 4+/5 Hip flexion, 5/5 Hip ER in sitting, 5/5 Hip IR in sitting, 4/5 Hip abduction (reports pain on R hip from sidelying position), 4/5 Hip extension, 5/5 Knee extension, 4/5 Knee flexion 12/2: R hip flexion, abduction, and adduction 4-/5 hip extension 3-/5    Time  12    Period  Weeks    Status  Partially Met    Target Date  02/18/19      PT LONG TERM GOAL #4   Title  Pt will decrease worst hip pain as reported on NPRS by at least 3 points in order to demonstrate clinically significant reduction in hip pain.    Baseline  11/23/18: 9/10 12/2: 5/10  Time  12    Period  Weeks    Status  Partially Met    Target Date  02/18/19            Plan -  01/14/19 4599    Clinical Impression Statement  Pt tolerated session well today.  Use of band around thighs and PT manual cues were helpful to promote improved glute and deep hip rotator mm activation during session.  She arrived ambulating with antalgic gait on R and associated R hip flexion/ant pelvic tilt and trunk flexion.  By end of session she ambulated with improved upright trunk posture and improved pelvic/hip positioning.  She still does not consistently achieve normal R hip extension during terminal stance phase of gait.    Personal Factors and Comorbidities  Comorbidity 3+    Comorbidities  DDD, migraine, IBS,    Examination-Activity Limitations  Bend;Lift;Squat;Caring for Others;Locomotion Level;Stairs;Carry;Stand    Examination-Participation Restrictions  Interpersonal Relationship;Yard Work;Cleaning;Laundry;Community Activity;Shop;Driving;Meal Prep    Stability/Clinical Decision Making  Stable/Uncomplicated    Rehab Potential  Good    PT Frequency  2x / week    PT Duration  12 weeks    PT Treatment/Interventions  Cryotherapy;Electrical Stimulation;Iontophoresis 25m/ml Dexamethasone;Moist Heat;Ultrasound;DME Instruction;Gait training;Stair training;Functional mobility training;Therapeutic activities;Therapeutic exercise;Balance training;Neuromuscular re-education;Patient/family education;Manual techniques;Scar mobilization;Passive range of motion;Dry needling;Taping;Joint Manipulations;ADLs/Self Care Home Management;Aquatic Therapy;Canalith Repostioning;Vestibular;Spinal Manipulations    PT Next Visit Plan  progress strength and ROM per protocol    PT Home Exercise Plan  [email or web address]    Consulted and Agree with Plan of Care  Patient       Patient will benefit from skilled therapeutic intervention in order to improve the following deficits and impairments:  Decreased balance, Decreased endurance, Decreased range of motion, Pain, Decreased strength, Abnormal gait  Visit Diagnosis: Pain in  right hip  Muscle weakness (generalized)     Problem List Patient Active Problem List   Diagnosis Date Noted  . Pain in right hip 01/12/2019  . Osteonecrosis of right hip ([place] 11/12/2018  . Vitamin D deficiency 02/25/2018  . Anemia, unspecified 01/08/2017  . Chronic pain 10/24/2015  . Hot flashes 10/24/2015  . Mood changes 10/24/2015  . Migraine without aura and without status migrainosus, not intractable 10/24/2015  . Metabolic syndrome 077/41/4239 . Dyslipidemia 10/24/2015  . Allergic rhinitis, seasonal 05/17/2015  . Asthma, mild intermittent 08/08/2014  . Chronic insomnia 08/08/2014  . DDD (degenerative disc disease), lumbar 08/08/2014  . Mood disorder ([place] 08/08/2014  . Gastro-esophageal reflux disease without esophagitis 08/08/2014  . H/O total hysterectomy 08/08/2014  . Irritable bowel syndrome with diarrhea 08/08/2014  . Osteoarthritis of right hip 08/08/2014  . Adult BMI 30+ 08/08/2014  . SBO (spina bifida occulta) 08/08/2014    RPincus Badder12/16/2020, 10:33 AM  RMerdis Delay PT, DPT Physical Therapist - [facility]- [place]MAIN RFranklin General HospitalSERVICES [address] [address]Phone: [phone]  Fax:  [phone] Name: Shelby DINESMRN: [id number] of Birth: 321-Jan-1972

## 2019-01-19 ENCOUNTER — Encounter: Payer: Self-pay | Admitting: Physical Therapy

## 2019-01-19 ENCOUNTER — Ambulatory Visit: Payer: No Typology Code available for payment source

## 2019-01-19 ENCOUNTER — Other Ambulatory Visit: Payer: Self-pay

## 2019-01-19 DIAGNOSIS — M25551 Pain in right hip: Secondary | ICD-10-CM

## 2019-01-19 DIAGNOSIS — M6281 Muscle weakness (generalized): Secondary | ICD-10-CM

## 2019-01-19 NOTE — Therapy (Signed)
[place] MAIN [facility] SERVICES [address] [place], [place], [address] Phone: [phone]   Fax:  [phone]  Physical Therapy Treatment  Patient Details  Name: Shelby Henson MRN: [id number] Date of Birth: 1970/02/25 Referring Provider (PT): Dr. Harlow Mares   Encounter Date: 01/19/2019  PT End of Session - 01/19/19 0942    Visit Number  15    Number of Visits  25    Date for PT Re-Evaluation  02/18/19    Authorization Type  2/10 PN 12/31/18    PT Start Time  0932    PT Stop Time  1015    PT Time Calculation (min)  43 min    Equipment Utilized During Treatment  Gait belt    Activity Tolerance  Patient tolerated treatment well       Past Medical History:  Diagnosis Date  . Anemia   . Asthma    well controlled  . Complication of anesthesia   . Depression   . GERD (gastroesophageal reflux disease)    occ  . Migraine   . Osteoarthritis   . PONV (postoperative nausea and vomiting)    with c-section x 1    Past Surgical History:  Procedure Laterality Date  . ABDOMINAL HYSTERECTOMY    . ABLATION    . BREAST CYST ASPIRATION Left   . CESAREAN SECTION    . CESAREAN SECTION    . CESAREAN SECTION    . LAPAROSCOPY  2018  . LEEP    . TOTAL HIP ARTHROPLASTY Right 11/12/2018   Procedure: TOTAL HIP ARTHROPLASTY ANTERIOR APPROACH;  Surgeon: Lovell Sheehan, MD;  Location: [facility] ORS;  Service: Orthopedics;  Laterality: Right;  . TOTAL VAGINAL HYSTERECTOMY      There were no vitals filed for this visit.  Subjective Assessment - 01/19/19 0937    Subjective  Patients main complaint is soreness. Reported yesterday was a "pretty good day" with low pain/stiffness. Did not take pain medicine for 8 hrs.    Patient is accompained by:  Family member    Pertinent History  Pt presents s/p right anterior total hip arthroplasty on 11/12/18.  She has completed 2 sessions of PT at another facility, however, her treatment sessions were not one-on-one, and she  was not getting the feedback she was looking for on her exercises.    Limitations  Sitting;Standing;Walking;Lifting;House hold activities    How long can you sit comfortably?  45 min    How long can you stand comfortably?  30 min    How long can you walk comfortably?  15 min    Patient Stated Goals  would like to be able to go back to the gym and have no pain; would like to be able to cook and clean again    Currently in Pain?  Yes    Pain Score  4     Pain Location  Hip    Pain Orientation  Right    Pain Descriptors / Indicators  Aching;Sore   "stiff"   Pain Type  Surgical pain    Pain Radiating Towards  worse in groin    Pain Onset  More than a month ago       Today's Treatment: Warm up: (history taken while warming up) Octane L3 x 5 min   Exercises:  Squat with butt touch to plinth 18-19" height, yellow band around distal thighs 2x20   Standing hip abduction lateral walking in mini squat with YTB around ankles  x2 laps (green line to green line twice)   SLS RLE 3x30 sec- focused on upright trunk posture and pelvis neutral   Standing hip extension to fatigue bilaterally with tactile cues for pelvic alignment/gute activation and UE support    Standing hip dips from stairs bilaterally until patient able to achieve motion 70% of the time  Cone taps bilaterally (with two cones in a horizontal line) 3x5 with occasional single UE support  Bridge with LAQ at top of motion x10 ea leg   Manual Hip flexor/quad stretches in prone at end of session: 4x30 sec with PT  Patient response/clinical impression: The patient continued to demonstrate high motivation to participate in therapy. Most challenged by pelvic control/hip alignment, verbal/tactile cues necessary throughout to address form/technique. Patient reported muscle fatigue at end of session as well as decreased stiffness/pain 3/10.      PT Education - 01/19/19 0939    Education Details  therapeutic exercise form/technique     Person(s) Educated  Patient    Methods  Explanation;Verbal cues    Comprehension  Need further instruction;Verbalized understanding;Returned demonstration       PT Short Term Goals - 12/31/18 1035      PT SHORT TERM GOAL #1   Title  Pt will be independent with HEP in order to improve strength and decrease back pain in order to improve pain-free function at home and work.    Baseline  12/2: HEP compliant    Time  6    Period  Weeks    Status  Partially Met    Target Date  01/07/19        PT Long Term Goals - 12/31/18 1052      PT LONG TERM GOAL #1   Title  Pt will increase her LEFS to greater than 60/80 in order to demonstrate a clinically significant change in order to improve her ability to perform ADLs and IADLs with less difficulty and to improve her quality of life.    Baseline  11/26/18: 9/80 12/2: 25/80    Time  12    Period  Weeks    Status  Partially Met    Target Date  02/18/19      PT LONG TERM GOAL #2   Title  Pt will improve her R hip ROM to within 10 degrees of her L hip ROM  in order to improve her ability to perform ADLs and IADLs with less difficulty and to improve her quality of life.    Baseline  11/26/18: 28 degrees of R hip IR, 12 degrees of R hip ER, 108 degrees of hip flexion, 20 deg of hip abduction, and -2 deg of hip extension; 12/03/18: L hip assessed today for comparison: L hip IR 45 deg, L hip ER 40 deg, L hip flexion 124 deg, L hip abduction 40 deg, PROM form hip extension L 25 deg, R 8 deg 12/2: R hip : flexion 114, abductoin 31 degrees, extension 11 degrees    Time  12    Period  Weeks    Status  Partially Met    Target Date  02/18/19      PT LONG TERM GOAL #3   Title  Pt will improve her R hip strength to a 4/5 in order to improve her ability to perform ADLs and IADLs with less difficulty and to improve her quality of life.    Baseline  11/26/18: 3+/5 hip flexion, 2+/5 hip ER (limited by pain and ROM), 3+/5  on hip IR, 3+/5 on hip abduction, 2+/5 on  hip extension, 5/5 on knee extension, and 4/5 on knee flexion; 12/03/18: L hip strength assesessed today for comparison: 4+/5 Hip flexion, 5/5 Hip ER in sitting, 5/5 Hip IR in sitting, 4/5 Hip abduction (reports pain on R hip from sidelying position), 4/5 Hip extension, 5/5 Knee extension, 4/5 Knee flexion 12/2: R hip flexion, abduction, and adduction 4-/5 hip extension 3-/5    Time  12    Period  Weeks    Status  Partially Met    Target Date  02/18/19      PT LONG TERM GOAL #4   Title  Pt will decrease worst hip pain as reported on NPRS by at least 3 points in order to demonstrate clinically significant reduction in hip pain.    Baseline  11/23/18: 9/10 12/2: 5/10    Time  12    Period  Weeks    Status  Partially Met    Target Date  02/18/19            Plan - 01/19/19 1114    Clinical Impression Statement  The patient continued to demonstrate high motivation to participate in therapy. Most challenged by pelvic control/hip alignment, verbal/tactile cues necessary throughout to address form/technique. Patient reported muscle fatigue at end of session as well as decreased stiffness/pain 3/10.    Personal Factors and Comorbidities  Comorbidity 3+    Comorbidities  DDD, migraine, IBS,    Examination-Activity Limitations  Bend;Lift;Squat;Caring for Others;Locomotion Level;Stairs;Carry;Stand    Examination-Participation Restrictions  Interpersonal Relationship;Yard Work;Cleaning;Laundry;Community Activity;Shop;Driving;Meal Prep    Stability/Clinical Decision Making  Stable/Uncomplicated    Rehab Potential  Good    PT Frequency  2x / week    PT Duration  12 weeks    PT Treatment/Interventions  Cryotherapy;Electrical Stimulation;Iontophoresis 48m/ml Dexamethasone;Moist Heat;Ultrasound;DME Instruction;Gait training;Stair training;Functional mobility training;Therapeutic activities;Therapeutic exercise;Balance training;Neuromuscular re-education;Patient/family education;Manual techniques;Scar  mobilization;Passive range of motion;Dry needling;Taping;Joint Manipulations;ADLs/Self Care Home Management;Aquatic Therapy;Canalith Repostioning;Vestibular;Spinal Manipulations    PT Next Visit Plan  progress strength and ROM per protocol    PT Home Exercise Plan  [email or web address]    Consulted and Agree with Plan of Care  Patient       Patient will benefit from skilled therapeutic intervention in order to improve the following deficits and impairments:  Decreased balance, Decreased endurance, Decreased range of motion, Pain, Decreased strength, Abnormal gait  Visit Diagnosis: Pain in right hip  Muscle weakness (generalized)     Problem List Patient Active Problem List   Diagnosis Date Noted  . Pain in right hip 01/12/2019  . Osteonecrosis of right hip ([place] 11/12/2018  . Vitamin D deficiency 02/25/2018  . Anemia, unspecified 01/08/2017  . Chronic pain 10/24/2015  . Hot flashes 10/24/2015  . Mood changes 10/24/2015  . Migraine without aura and without status migrainosus, not intractable 10/24/2015  . Metabolic syndrome 003/49/1791 . Dyslipidemia 10/24/2015  . Allergic rhinitis, seasonal 05/17/2015  . Asthma, mild intermittent 08/08/2014  . Chronic insomnia 08/08/2014  . DDD (degenerative disc disease), lumbar 08/08/2014  . Mood disorder ([place] 08/08/2014  . Gastro-esophageal reflux disease without esophagitis 08/08/2014  . H/O total hysterectomy 08/08/2014  . Irritable bowel syndrome with diarrhea 08/08/2014  . Osteoarthritis of right hip 08/08/2014  . Adult BMI 30+ 08/08/2014  . SBO (spina bifida occulta) 08/08/2014    DLieutenant DiegoPT, DPT 11:20 AM,01/19/19   [facility] [address]RMab[address] [place] [address]Phone: [phone]  Fax:  [phone]  Name: Shelby Henson MRN: [id number] Date of Birth: [birth year]

## 2019-01-21 ENCOUNTER — Other Ambulatory Visit: Payer: Self-pay

## 2019-01-21 ENCOUNTER — Encounter: Payer: Self-pay | Admitting: Physical Therapy

## 2019-01-21 ENCOUNTER — Ambulatory Visit: Payer: No Typology Code available for payment source

## 2019-01-21 DIAGNOSIS — M25551 Pain in right hip: Secondary | ICD-10-CM

## 2019-01-21 DIAGNOSIS — M6281 Muscle weakness (generalized): Secondary | ICD-10-CM

## 2019-01-21 NOTE — Therapy (Signed)
[place] MAIN [facility] SERVICES [address] [place], [place], [address] Phone: [phone]   Fax:  [phone]  Physical Therapy Treatment  Patient Details  Name: Shelby Henson MRN: [id number] Date of Birth: Apr 11, 1970 Referring Provider (PT): Dr. Harlow Mares   Encounter Date: 01/21/2019  PT End of Session - 01/21/19 0939    Visit Number  16    Number of Visits  25    Date for PT Re-Evaluation  02/18/19    Authorization Type  3/10 PN 12/31/18    PT Start Time  0935    PT Stop Time  1015    PT Time Calculation (min)  40 min    Equipment Utilized During Treatment  Gait belt    Activity Tolerance  Patient tolerated treatment well    Behavior During Therapy  [facility] for tasks assessed/performed       Past Medical History:  Diagnosis Date  . Anemia   . Asthma    well controlled  . Complication of anesthesia   . Depression   . GERD (gastroesophageal reflux disease)    occ  . Migraine   . Osteoarthritis   . PONV (postoperative nausea and vomiting)    with c-section x 1    Past Surgical History:  Procedure Laterality Date  . ABDOMINAL HYSTERECTOMY    . ABLATION    . BREAST CYST ASPIRATION Left   . CESAREAN SECTION    . CESAREAN SECTION    . CESAREAN SECTION    . LAPAROSCOPY  2018  . LEEP    . TOTAL HIP ARTHROPLASTY Right 11/12/2018   Procedure: TOTAL HIP ARTHROPLASTY ANTERIOR APPROACH;  Surgeon: Lovell Sheehan, MD;  Location: [facility] ORS;  Service: Orthopedics;  Laterality: Right;  . TOTAL VAGINAL HYSTERECTOMY      There were no vitals filed for this visit.  Subjective Assessment - 01/21/19 0935    Subjective  Patient reported that she is sore today. Patient reported doing some exercises since last session. Has had pain medicine today about 1 hour prior to PT.    Patient is accompained by:  Family member    Pertinent History  Pt presents s/p right anterior total hip arthroplasty on 11/12/18.  She has completed 2 sessions of PT at  another facility, however, her treatment sessions were not one-on-one, and she was not getting the feedback she was looking for on her exercises.    How long can you sit comfortably?  45 min    How long can you stand comfortably?  30 min    How long can you walk comfortably?  15 min    Patient Stated Goals  would like to be able to go back to the gym and have no pain; would like to be able to cook and clean again    Currently in Pain?  Yes    Pain Score  5     Pain Location  Hip    Pain Orientation  Right    Pain Descriptors / Indicators  Aching;Sore    Pain Type  Surgical pain         Today's Treatment: Warm up: (history taken while warming up)  Octane L3 x 5 min   Exercises:   Squat with butt touch to plinth 18-19" height, yellow band around distal thighs 2x20   Standing hip abduction lateral walking in mini squat with YTB around ankles x2 laps (green line to green line twice)   SLS RLE 3x30  sec- focused on upright trunk posture and pelvis neutral    Butterfly stretch 3x30sec Bridge x10 with cues for maximum glute squeeze Bridge with LAQ at top of motion x5 ea leg cues for weight bearing through heel slides Bridge x10 with cues for max squeeze x10   Manual therapy: Manual Hip flexor/quad stretches in prone at start of session: 4x30 sec with PT STM/myofascial release to anterior/posterior hip x34mns     Patient response/clinical impression: The patient continued to demonstrate high motivation to participate in therapy. The patient exhibited moderate antalgic gait when she presented to session; after manual therapy and supine exercises exhibited mild antalgia and reported feeling "looser". The patient continued to need verbal cues to maximize glute activation with all exercises. The patient would benefit from further skilled PT intervention to progress towards goals.    PT Education - 01/21/19 [id number]   Education Details  therapeutic exercise form/technique    Person(s)  Educated  Patient    Methods  Explanation;Verbal cues    Comprehension  Verbalized understanding;Returned demonstration;Verbal cues required       PT Short Term Goals - 12/31/18 1035      PT SHORT TERM GOAL #1   Title  Pt will be independent with HEP in order to improve strength and decrease back pain in order to improve pain-free function at home and work.    Baseline  12/2: HEP compliant    Time  6    Period  Weeks    Status  Partially Met    Target Date  01/07/19        PT Long Term Goals - 12/31/18 1052      PT LONG TERM GOAL #1   Title  Pt will increase her LEFS to greater than 60/80 in order to demonstrate a clinically significant change in order to improve her ability to perform ADLs and IADLs with less difficulty and to improve her quality of life.    Baseline  11/26/18: 9/80 12/2: 25/80    Time  12    Period  Weeks    Status  Partially Met    Target Date  02/18/19      PT LONG TERM GOAL #2   Title  Pt will improve her R hip ROM to within 10 degrees of her L hip ROM  in order to improve her ability to perform ADLs and IADLs with less difficulty and to improve her quality of life.    Baseline  11/26/18: 28 degrees of R hip IR, 12 degrees of R hip ER, 108 degrees of hip flexion, 20 deg of hip abduction, and -2 deg of hip extension; 12/03/18: L hip assessed today for comparison: L hip IR 45 deg, L hip ER 40 deg, L hip flexion 124 deg, L hip abduction 40 deg, PROM form hip extension L 25 deg, R 8 deg 12/2: R hip : flexion 114, abductoin 31 degrees, extension 11 degrees    Time  12    Period  Weeks    Status  Partially Met    Target Date  02/18/19      PT LONG TERM GOAL #3   Title  Pt will improve her R hip strength to a 4/5 in order to improve her ability to perform ADLs and IADLs with less difficulty and to improve her quality of life.    Baseline  11/26/18: 3+/5 hip flexion, 2+/5 hip ER (limited by pain and ROM), 3+/5 on hip IR, 3+/5 on hip  abduction, 2+/5 on hip  extension, 5/5 on knee extension, and 4/5 on knee flexion; 12/03/18: L hip strength assesessed today for comparison: 4+/5 Hip flexion, 5/5 Hip ER in sitting, 5/5 Hip IR in sitting, 4/5 Hip abduction (reports pain on R hip from sidelying position), 4/5 Hip extension, 5/5 Knee extension, 4/5 Knee flexion 12/2: R hip flexion, abduction, and adduction 4-/5 hip extension 3-/5    Time  12    Period  Weeks    Status  Partially Met    Target Date  02/18/19      PT LONG TERM GOAL #4   Title  Pt will decrease worst hip pain as reported on NPRS by at least 3 points in order to demonstrate clinically significant reduction in hip pain.    Baseline  11/23/18: 9/10 12/2: 5/10    Time  12    Period  Weeks    Status  Partially Met    Target Date  02/18/19            Plan - 01/21/19 1610    Clinical Impression Statement  The patient continued to demonstrate high motivation to participate in therapy. The patient exhibited moderate antalgic gait when she presented to session; after manual therapy and supine exercises exhibited mild antalgia and reported feeling "looser". The patient continued to need verbal cues to maximize glute activation with all exercises. The patient would benefit from further skilled PT intervention to progress towards goals.    Personal Factors and Comorbidities  Comorbidity 3+    Comorbidities  DDD, migraine, IBS,    Examination-Activity Limitations  Bend;Lift;Squat;Caring for Others;Locomotion Level;Stairs;Carry;Stand    Examination-Participation Restrictions  Interpersonal Relationship;Yard Work;Cleaning;Laundry;Community Activity;Shop;Driving;Meal Prep    Rehab Potential  Good    PT Frequency  2x / week    PT Duration  12 weeks    PT Treatment/Interventions  Cryotherapy;Electrical Stimulation;Iontophoresis 57m/ml Dexamethasone;Moist Heat;Ultrasound;DME Instruction;Gait training;Stair training;Functional mobility training;Therapeutic activities;Therapeutic exercise;Balance  training;Neuromuscular re-education;Patient/family education;Manual techniques;Scar mobilization;Passive range of motion;Dry needling;Taping;Joint Manipulations;ADLs/Self Care Home Management;Aquatic Therapy;Canalith Repostioning;Vestibular;Spinal Manipulations    PT Next Visit Plan  progress strength and ROM per protocol    PT Home Exercise Plan  [email or web address]    Consulted and Agree with Plan of Care  Patient       Patient will benefit from skilled therapeutic intervention in order to improve the following deficits and impairments:  Decreased balance, Decreased endurance, Decreased range of motion, Pain, Decreased strength, Abnormal gait  Visit Diagnosis: Pain in right hip  Muscle weakness (generalized)     Problem List Patient Active Problem List   Diagnosis Date Noted  . Pain in right hip 01/12/2019  . Osteonecrosis of right hip ([place] 11/12/2018  . Vitamin D deficiency 02/25/2018  . Anemia, unspecified 01/08/2017  . Chronic pain 10/24/2015  . Hot flashes 10/24/2015  . Mood changes 10/24/2015  . Migraine without aura and without status migrainosus, not intractable 10/24/2015  . Metabolic syndrome 096/04/5407 . Dyslipidemia 10/24/2015  . Allergic rhinitis, seasonal 05/17/2015  . Asthma, mild intermittent 08/08/2014  . Chronic insomnia 08/08/2014  . DDD (degenerative disc disease), lumbar 08/08/2014  . Mood disorder ([place] 08/08/2014  . Gastro-esophageal reflux disease without esophagitis 08/08/2014  . H/O total hysterectomy 08/08/2014  . Irritable bowel syndrome with diarrhea 08/08/2014  . Osteoarthritis of right hip 08/08/2014  . Adult BMI 30+ 08/08/2014  . SBO (spina bifida occulta) 08/08/2014   DLieutenant DiegoPT, DPT 10:32 AM,01/21/19   [facility]  [place], [place], [address] Phone: [phone]   Fax:  [phone]  Name: HARUE PRIBBLE MRN: [id number] Date of Birth: 12-04-1970

## 2019-01-26 ENCOUNTER — Ambulatory Visit: Payer: No Typology Code available for payment source | Admitting: Physical Therapy

## 2019-01-26 ENCOUNTER — Other Ambulatory Visit: Payer: Self-pay

## 2019-01-26 ENCOUNTER — Encounter: Payer: Self-pay | Admitting: Physical Therapy

## 2019-01-26 DIAGNOSIS — M6281 Muscle weakness (generalized): Secondary | ICD-10-CM

## 2019-01-26 DIAGNOSIS — M25551 Pain in right hip: Secondary | ICD-10-CM | POA: Diagnosis not present

## 2019-01-26 NOTE — Therapy (Signed)
[place] MAIN [facility] SERVICES [address] [place], [place], [address] Phone: [phone]   Fax:  [phone]  Physical Therapy Treatment  Patient Details  Name: Shelby Henson MRN: [id number] Date of Birth: 1970-06-30 Referring Provider (PT): Dr. Harlow Mares   Encounter Date: 01/26/2019  PT End of Session - 01/26/19 0910    Visit Number  17    Number of Visits  25    Date for PT Re-Evaluation  02/18/19    Authorization Type  3/10 PN 12/31/18    PT Start Time  0915    PT Stop Time  1000    PT Time Calculation (min)  45 min    Equipment Utilized During Treatment  Gait belt    Activity Tolerance  Patient tolerated treatment well    Behavior During Therapy  [facility] for tasks assessed/performed       Past Medical History:  Diagnosis Date  . Anemia   . Asthma    well controlled  . Complication of anesthesia   . Depression   . GERD (gastroesophageal reflux disease)    occ  . Migraine   . Osteoarthritis   . PONV (postoperative nausea and vomiting)    with c-section x 1    Past Surgical History:  Procedure Laterality Date  . ABDOMINAL HYSTERECTOMY    . ABLATION    . BREAST CYST ASPIRATION Left   . CESAREAN SECTION    . CESAREAN SECTION    . CESAREAN SECTION    . LAPAROSCOPY  2018  . LEEP    . TOTAL HIP ARTHROPLASTY Right 11/12/2018   Procedure: TOTAL HIP ARTHROPLASTY ANTERIOR APPROACH;  Surgeon: Lovell Sheehan, MD;  Location: [facility] ORS;  Service: Orthopedics;  Laterality: Right;  . TOTAL VAGINAL HYSTERECTOMY      There were no vitals filed for this visit.  Subjective Assessment - 01/26/19 0922    Subjective  Patient reports doing well. She has been walking better and hasn't been using her cane. She still has some soreness but its not bad.    Patient is accompained by:  Family member    Pertinent History  Pt presents s/p right anterior total hip arthroplasty on 11/12/18.  She has completed 2 sessions of PT at another facility, however,  her treatment sessions were not one-on-one, and she was not getting the feedback she was looking for on her exercises.    How long can you sit comfortably?  45 min    How long can you stand comfortably?  30 min    How long can you walk comfortably?  15 min    Patient Stated Goals  would like to be able to go back to the gym and have no pain; would like to be able to cook and clean again    Currently in Pain?  Yes    Pain Score  3     Pain Location  Hip    Pain Orientation  Right    Pain Descriptors / Indicators  Aching;Sore    Pain Type  Surgical pain    Pain Onset  More than a month ago    Pain Frequency  Intermittent    Aggravating Factors   unsure    Pain Relieving Factors  ice    Effect of Pain on Daily Activities  pushes through the pain;    Multiple Pain Sites  No             TREATMENT: Octane L3  x 5 min  Exercises: Patient prone: PT applied towel roll under knee and instructed patient in RLE knee flexion x10 reps for better quad stretch;   PT performed passive quad stretch 30 sec hold x2 reps RLE only PT performed grade II-III PA mobs to right proximal femoral joint 20 sec bouts x2 sets;   Patient supine: Attempted: Bridge with LAQ at top of motionx10 ea leg but patient complained of increased catch in anterior right hip;  PT performed long axis distraction to RLE 20 sec bouts x2-3 repetition. No pain during pulling but reports continued catch upon release;  Standing: Yellow tband around BLE ankles: Hip abduction x15  Bilaterally  Diagonal stepping forward/ backward yellow tband around BLE knees with squat x15 feet x2 laps each direction with supervision for safety;   Squat with butt touch to plinth 18-19" height, yellow band around distal thighs x20  Standing at matrix: -hip extension SLR 7.5# x10 each LE -gluteal max hip extension donkey kicks 7.5# x10 each LE:  Required HHA to chair and min VCs for proper positioning for optimal strengthening;  Reports  increased catch in RLE hip with weight bearing, deferred LLE gluteal extension due to catching;  Patient prone: PT applied moist heat to patient's anterior hip with prone exercise 3# ankle weights: -alternate knee flexion x15 bilaterally; -hip extension x15 bilaterally;  Patient sidelying: PT palpated right hip, she reports tenderness to right hip flexor tendon and along ASIS; PT utilized rolling stick to right anterior thigh and along lateral hip x2 min with moderate tenderness reported  PT performed passive ROM to RLE hip circles clockwise/counterclockwise x5 reps each; PT performed passive piriformis stretch 30 sec hold x2 reps;  Educated patient on how to do seated piriformis stretch but this was too difficult due to anterior hip discomfort;   Patient response/clinical impression: The patient continued to demonstrate high motivation to participate in therapy. She reported increased right anterior hip discomfort/catch during standing exercise. PT instructed patient in stretches and applied moist heat, however patient continues to have discomfort at end of session. Patient admitted that she did a lot of walking on Saturday. Her soreness could be delayed onset muscle soreness from extra activity. Recommended patient do light walking and light activity today to keep hip loose to help reduce soreness.                    PT Education - 01/26/19 0909    Education Details  exercise, HEP    Person(s) Educated  Patient    Methods  Explanation;Verbal cues    Comprehension  Verbalized understanding;Returned demonstration;Verbal cues required;Need further instruction       PT Short Term Goals - 12/31/18 1035      PT SHORT TERM GOAL #1   Title  Pt will be independent with HEP in order to improve strength and decrease back pain in order to improve pain-free function at home and work.    Baseline  12/2: HEP compliant    Time  6    Period  Weeks    Status  Partially Met     Target Date  01/07/19        PT Long Term Goals - 12/31/18 1052      PT LONG TERM GOAL #1   Title  Pt will increase her LEFS to greater than 60/80 in order to demonstrate a clinically significant change in order to improve her ability to perform ADLs and IADLs with less difficulty and  to improve her quality of life.    Baseline  11/26/18: 9/80 12/2: 25/80    Time  12    Period  Weeks    Status  Partially Met    Target Date  02/18/19      PT LONG TERM GOAL #2   Title  Pt will improve her R hip ROM to within 10 degrees of her L hip ROM  in order to improve her ability to perform ADLs and IADLs with less difficulty and to improve her quality of life.    Baseline  11/26/18: 28 degrees of R hip IR, 12 degrees of R hip ER, 108 degrees of hip flexion, 20 deg of hip abduction, and -2 deg of hip extension; 12/03/18: L hip assessed today for comparison: L hip IR 45 deg, L hip ER 40 deg, L hip flexion 124 deg, L hip abduction 40 deg, PROM form hip extension L 25 deg, R 8 deg 12/2: R hip : flexion 114, abductoin 31 degrees, extension 11 degrees    Time  12    Period  Weeks    Status  Partially Met    Target Date  02/18/19      PT LONG TERM GOAL #3   Title  Pt will improve her R hip strength to a 4/5 in order to improve her ability to perform ADLs and IADLs with less difficulty and to improve her quality of life.    Baseline  11/26/18: 3+/5 hip flexion, 2+/5 hip ER (limited by pain and ROM), 3+/5 on hip IR, 3+/5 on hip abduction, 2+/5 on hip extension, 5/5 on knee extension, and 4/5 on knee flexion; 12/03/18: L hip strength assesessed today for comparison: 4+/5 Hip flexion, 5/5 Hip ER in sitting, 5/5 Hip IR in sitting, 4/5 Hip abduction (reports pain on R hip from sidelying position), 4/5 Hip extension, 5/5 Knee extension, 4/5 Knee flexion 12/2: R hip flexion, abduction, and adduction 4-/5 hip extension 3-/5    Time  12    Period  Weeks    Status  Partially Met    Target Date  02/18/19      PT LONG  TERM GOAL #4   Title  Pt will decrease worst hip pain as reported on NPRS by at least 3 points in order to demonstrate clinically significant reduction in hip pain.    Baseline  11/23/18: 9/10 12/2: 5/10    Time  12    Period  Weeks    Status  Partially Met    Target Date  02/18/19            Plan - 01/26/19 1012    Clinical Impression Statement  Patient motivated and participated fair within session. she reports increased anterior hip catching/discomfort during session. Modified exercise to reduce soreness. Patient required min VCs for correct exercise technique and positioning. She exhibited compensation with RLE stance activities due to discomfort requiring cues for erect posture and better gluteal activation/hip extension. Patient would benefit from additional skilled PT intervention to improve strength, balance and mobility;    Personal Factors and Comorbidities  Comorbidity 3+    Comorbidities  DDD, migraine, IBS,    Examination-Activity Limitations  Bend;Lift;Squat;Caring for Others;Locomotion Level;Stairs;Carry;Stand    Examination-Participation Restrictions  Interpersonal Relationship;Yard Work;Cleaning;Laundry;Community Activity;Shop;Driving;Meal Prep    Rehab Potential  Good    PT Frequency  2x / week    PT Duration  12 weeks    PT Treatment/Interventions  Cryotherapy;Electrical Stimulation;Iontophoresis 4mg/ml Dexamethasone;Moist Heat;Ultrasound;DME Instruction;Gait training;Stair   training;Functional mobility training;Therapeutic activities;Therapeutic exercise;Balance training;Neuromuscular re-education;Patient/family education;Manual techniques;Scar mobilization;Passive range of motion;Dry needling;Taping;Joint Manipulations;ADLs/Self Care Home Management;Aquatic Therapy;Canalith Repostioning;Vestibular;Spinal Manipulations    PT Next Visit Plan  progress strength and ROM per protocol    PT Home Exercise Plan  [email or web address]    Consulted and Agree with Plan of Care  Patient        Patient will benefit from skilled therapeutic intervention in order to improve the following deficits and impairments:  Decreased balance, Decreased endurance, Decreased range of motion, Pain, Decreased strength, Abnormal gait  Visit Diagnosis: Pain in right hip  Muscle weakness (generalized)     Problem List Patient Active Problem List   Diagnosis Date Noted  . Pain in right hip 01/12/2019  . Osteonecrosis of right hip (HCC) 11/12/2018  . Vitamin D deficiency 02/25/2018  . Anemia, unspecified 01/08/2017  . Chronic pain 10/24/2015  . Hot flashes 10/24/2015  . Mood changes 10/24/2015  . Migraine without aura and without status migrainosus, not intractable 10/24/2015  . Metabolic syndrome 10/24/2015  . Dyslipidemia 10/24/2015  . Allergic rhinitis, seasonal 05/17/2015  . Asthma, mild intermittent 08/08/2014  . Chronic insomnia 08/08/2014  . DDD (degenerative disc disease), lumbar 08/08/2014  . Mood disorder (HCC) 08/08/2014  . Gastro-esophageal reflux disease without esophagitis 08/08/2014  . H/O total hysterectomy 08/08/2014  . Irritable bowel syndrome with diarrhea 08/08/2014  . Osteoarthritis of right hip 08/08/2014  . Adult BMI 30+ 08/08/2014  . SBO (spina bifida occulta) 08/08/2014    , PT, DPT 01/26/2019, 10:14 AM  [facility] [address] [place], [place], [address] Phone: [phone]   Fax:  [phone]  Name: Shelby Henson MRN: [id number] Date of Birth: 12/26/1970   

## 2019-01-28 ENCOUNTER — Encounter: Payer: Self-pay | Admitting: Physical Therapy

## 2019-01-28 ENCOUNTER — Ambulatory Visit: Payer: No Typology Code available for payment source | Admitting: Physical Therapy

## 2019-01-28 ENCOUNTER — Other Ambulatory Visit: Payer: Self-pay

## 2019-01-28 DIAGNOSIS — M25551 Pain in right hip: Secondary | ICD-10-CM

## 2019-01-28 DIAGNOSIS — M6281 Muscle weakness (generalized): Secondary | ICD-10-CM

## 2019-01-28 NOTE — Therapy (Signed)
[place] MAIN [facility] SERVICES [address] [place], [place], [address] Phone: [phone]   Fax:  [phone]  Physical Therapy Treatment  Patient Details  Name: Shelby Henson MRN: [id number] Date of Birth: 05-25-1970 Referring Provider (PT): Dr. Harlow Mares   Encounter Date: 01/28/2019  PT End of Session - 01/28/19 0928    Visit Number  18    Number of Visits  25    Date for PT Re-Evaluation  02/18/19    Authorization Type  last progress note 12/31/18    PT Start Time  0932    PT Stop Time  1015    PT Time Calculation (min)  43 min    Equipment Utilized During Treatment  Gait belt    Activity Tolerance  Patient tolerated treatment well    Behavior During Therapy  [facility] for tasks assessed/performed       Past Medical History:  Diagnosis Date  . Anemia   . Asthma    well controlled  . Complication of anesthesia   . Depression   . GERD (gastroesophageal reflux disease)    occ  . Migraine   . Osteoarthritis   . PONV (postoperative nausea and vomiting)    with c-section x 1    Past Surgical History:  Procedure Laterality Date  . ABDOMINAL HYSTERECTOMY    . ABLATION    . BREAST CYST ASPIRATION Left   . CESAREAN SECTION    . CESAREAN SECTION    . CESAREAN SECTION    . LAPAROSCOPY  2018  . LEEP    . TOTAL HIP ARTHROPLASTY Right 11/12/2018   Procedure: TOTAL HIP ARTHROPLASTY ANTERIOR APPROACH;  Surgeon: Lovell Sheehan, MD;  Location: [facility] ORS;  Service: Orthopedics;  Laterality: Right;  . TOTAL VAGINAL HYSTERECTOMY      There were no vitals filed for this visit.  Subjective Assessment - 01/28/19 0937    Subjective  Patient reports continued soreness in right anterior hip. She reports overall doing okay. No new changes since last visit.    Patient is accompained by:  Family member    Pertinent History  Pt presents s/p right anterior total hip arthroplasty on 11/12/18.  She has completed 2 sessions of PT at another facility,  however, her treatment sessions were not one-on-one, and she was not getting the feedback she was looking for on her exercises.    How long can you sit comfortably?  45 min    How long can you stand comfortably?  30 min    How long can you walk comfortably?  15 min    Patient Stated Goals  would like to be able to go back to the gym and have no pain; would like to be able to cook and clean again    Currently in Pain?  Yes    Pain Score  3     Pain Location  Hip    Pain Orientation  Right;Anterior    Pain Descriptors / Indicators  Aching;Sore    Pain Type  Surgical pain    Pain Onset  More than a month ago    Pain Frequency  Intermittent    Aggravating Factors   unsure    Pain Relieving Factors  ice/stretch    Effect of Pain on Daily Activities  pushes through the pain;    Multiple Pain Sites  No              TREATMENT: Warm up on Treadmill 1.5  mph with 2 HHA x 37mn with cues to increase step length for better gait mechanics. Tolerated well without increase in hip pain;   Exercises: Standing: In parallel bars: - forward lunge to BOSU x10 reps each LE with B rail assist;  -Standing hip flexion march against green tband 2x12 bilaterally with B rail assist and cues to keep erect posture for better stance control;  -forward step up on BOSU with contralateral LE hip flexion x10 reps each LE with B rail assist for safety;   Side stepping with squat with yellow tband around thighs x10 meter x2 laps each direction;   Standing with yellow tband around BLE knees: -squat with hip abduction in forward, side, backward diagonals x5 reps each LE unsupported with min VCs for proper positioning;   Squat with butt touch to plinth 18-19" height, yellow band around distal thighs 2 x20   Patient sidelying: RLE hip abduction circles clockwise/counterclockwise x10 with 2# ankle weight each LE; required min VCs for correct positioning including to avoid trunk rotation for better hip control.    Pt Qped: Hip extension 2# x10 bilaterally with mod VCs to increase core stabilization and avoid leaning to one side for better hip stabilization; Gluteal max hip extension 2# x10 with min VCs for proper positioning to activate gluteal max for improved strength;  1/2 kneel hip flexor stretch 30 sec hold x2 reps;  Patient supine: -Single knee to chest stretch 20 sec hold x2 reps bilaterally;  PT performed grade II-III lateral proximal femoral joint mobs to RLE 20 sec bouts, 10 sec rest x3 min; Patient reports less pain during initial pull but then reports return of symptoms during rest. No increase in pain at rest but return to 3-4/10    Patient response/clinical impression: The patient continued to demonstrate high motivation to participate in therapy. Advanced LE strengthening focusing on proximal hip strengthening in all directions. Patient requires min VCS for proper positioning and exercise technique for optimal muscle activation. She reports continued soreness in anterior right hip but states that its not worse. Reinforced HEP                  PT Education - 01/28/19 0927    Education Details  exercise, HEP    Person(s) Educated  Patient    Methods  Explanation;Verbal cues    Comprehension  Verbalized understanding;Returned demonstration;Verbal cues required;Need further instruction       PT Short Term Goals - 12/31/18 1035      PT SHORT TERM GOAL #1   Title  Pt will be independent with HEP in order to improve strength and decrease back pain in order to improve pain-free function at home and work.    Baseline  12/2: HEP compliant    Time  6    Period  Weeks    Status  Partially Met    Target Date  01/07/19        PT Long Term Goals - 12/31/18 1052      PT LONG TERM GOAL #1   Title  Pt will increase her LEFS to greater than 60/80 in order to demonstrate a clinically significant change in order to improve her ability to perform ADLs and IADLs with less  difficulty and to improve her quality of life.    Baseline  11/26/18: 9/80 12/2: 25/80    Time  12    Period  Weeks    Status  Partially Met    Target Date  02/18/19  PT LONG TERM GOAL #2   Title  Pt will improve her R hip ROM to within 10 degrees of her L hip ROM  in order to improve her ability to perform ADLs and IADLs with less difficulty and to improve her quality of life.    Baseline  11/26/18: 28 degrees of R hip IR, 12 degrees of R hip ER, 108 degrees of hip flexion, 20 deg of hip abduction, and -2 deg of hip extension; 12/03/18: L hip assessed today for comparison: L hip IR 45 deg, L hip ER 40 deg, L hip flexion 124 deg, L hip abduction 40 deg, PROM form hip extension L 25 deg, R 8 deg 12/2: R hip : flexion 114, abductoin 31 degrees, extension 11 degrees    Time  12    Period  Weeks    Status  Partially Met    Target Date  02/18/19      PT LONG TERM GOAL #3   Title  Pt will improve her R hip strength to a 4/5 in order to improve her ability to perform ADLs and IADLs with less difficulty and to improve her quality of life.    Baseline  11/26/18: 3+/5 hip flexion, 2+/5 hip ER (limited by pain and ROM), 3+/5 on hip IR, 3+/5 on hip abduction, 2+/5 on hip extension, 5/5 on knee extension, and 4/5 on knee flexion; 12/03/18: L hip strength assesessed today for comparison: 4+/5 Hip flexion, 5/5 Hip ER in sitting, 5/5 Hip IR in sitting, 4/5 Hip abduction (reports pain on R hip from sidelying position), 4/5 Hip extension, 5/5 Knee extension, 4/5 Knee flexion 12/2: R hip flexion, abduction, and adduction 4-/5 hip extension 3-/5    Time  12    Period  Weeks    Status  Partially Met    Target Date  02/18/19      PT LONG TERM GOAL #4   Title  Pt will decrease worst hip pain as reported on NPRS by at least 3 points in order to demonstrate clinically significant reduction in hip pain.    Baseline  11/23/18: 9/10 12/2: 5/10    Time  12    Period  Weeks    Status  Partially Met    Target Date   02/18/19            Plan - 01/28/19 1029    Clinical Impression Statement  Patient motivated and participated well within session. She continues to have anterior hip discomfort in groin especially with prolonged weight bearing and/or hip flexion. Instructed patient in advanced LE exercise. patient requires min Vcs for correct exercise technique. She responds well to cues with better positioning and body mechanics. Patient denies any increase in pain at end of session. She would benefit from additional skilled PT intervention to improve strength and mobility while reducing hip pain;    Personal Factors and Comorbidities  Comorbidity 3+    Comorbidities  DDD, migraine, IBS,    Examination-Activity Limitations  Bend;Lift;Squat;Caring for Others;Locomotion Level;Stairs;Carry;Stand    Examination-Participation Restrictions  Interpersonal Relationship;Yard Work;Cleaning;Laundry;Community Activity;Shop;Driving;Meal Prep    Rehab Potential  Good    PT Frequency  2x / week    PT Duration  12 weeks    PT Treatment/Interventions  Cryotherapy;Electrical Stimulation;Iontophoresis 10m/ml Dexamethasone;Moist Heat;Ultrasound;DME Instruction;Gait training;Stair training;Functional mobility training;Therapeutic activities;Therapeutic exercise;Balance training;Neuromuscular re-education;Patient/family education;Manual techniques;Scar mobilization;Passive range of motion;Dry needling;Taping;Joint Manipulations;ADLs/Self Care Home Management;Aquatic Therapy;Canalith Repostioning;Vestibular;Spinal Manipulations    PT Next Visit Plan  progress strength and ROM per  protocol    PT Home Exercise Plan  [email or web address]    Consulted and Agree with Plan of Care  Patient       Patient will benefit from skilled therapeutic intervention in order to improve the following deficits and impairments:  Decreased balance, Decreased endurance, Decreased range of motion, Pain, Decreased strength, Abnormal gait  Visit Diagnosis: Pain in  right hip  Muscle weakness (generalized)     Problem List Patient Active Problem List   Diagnosis Date Noted  . Pain in right hip 01/12/2019  . Osteonecrosis of right hip ([place]) 11/12/2018  . Vitamin D deficiency 02/25/2018  . Anemia, unspecified 01/08/2017  . Chronic pain 10/24/2015  . Hot flashes 10/24/2015  . Mood changes 10/24/2015  . Migraine without aura and without status migrainosus, not intractable 10/24/2015  . Metabolic syndrome 03/55/9741  . Dyslipidemia 10/24/2015  . Allergic rhinitis, seasonal 05/17/2015  . Asthma, mild intermittent 08/08/2014  . Chronic insomnia 08/08/2014  . DDD (degenerative disc disease), lumbar 08/08/2014  . Mood disorder ([place]) 08/08/2014  . Gastro-esophageal reflux disease without esophagitis 08/08/2014  . H/O total hysterectomy 08/08/2014  . Irritable bowel syndrome with diarrhea 08/08/2014  . Osteoarthritis of right hip 08/08/2014  . Adult BMI 30+ 08/08/2014  . SBO (spina bifida occulta) 08/08/2014    Abbygael Curtiss PT, DPT 01/28/2019, 3:11 PM  [place] MAIN [facility] SERVICES [address] [place], [place], [address] Phone: [phone]   Fax:  [phone]  Name: NOELE ICENHOUR MRN: [id number] Date of Birth: 08/02/1970

## 2019-02-02 ENCOUNTER — Ambulatory Visit: Payer: No Typology Code available for payment source | Attending: Orthopedic Surgery

## 2019-02-02 ENCOUNTER — Other Ambulatory Visit: Payer: Self-pay

## 2019-02-02 DIAGNOSIS — M6281 Muscle weakness (generalized): Secondary | ICD-10-CM | POA: Insufficient documentation

## 2019-02-02 DIAGNOSIS — M25551 Pain in right hip: Secondary | ICD-10-CM | POA: Diagnosis present

## 2019-02-02 NOTE — Therapy (Signed)
[place] MAIN [facility] SERVICES [address] [place], [place], [address] Phone: [phone]   Fax:  [phone]  Physical Therapy Treatment  Patient Details  Name: Shelby Henson MRN: [id number] Date of Birth: 15-Dec-1970 Referring Provider (PT): Dr. Harlow Mares   Encounter Date: 02/02/2019  PT End of Session - 02/02/19 0817    Visit Number  19    Number of Visits  25    Date for PT Re-Evaluation  02/18/19    Authorization Type  last progress note 12/31/18    PT Start Time  0803    PT Stop Time  0843    PT Time Calculation (min)  40 min    Equipment Utilized During Treatment  Gait belt    Activity Tolerance  Patient tolerated treatment well;No increased pain    Behavior During Therapy  WFL for tasks assessed/performed       Past Medical History:  Diagnosis Date  . Anemia   . Asthma    well controlled  . Complication of anesthesia   . Depression   . GERD (gastroesophageal reflux disease)    occ  . Migraine   . Osteoarthritis   . PONV (postoperative nausea and vomiting)    with c-section x 1    Past Surgical History:  Procedure Laterality Date  . ABDOMINAL HYSTERECTOMY    . ABLATION    . BREAST CYST ASPIRATION Left   . CESAREAN SECTION    . CESAREAN SECTION    . CESAREAN SECTION    . LAPAROSCOPY  2018  . LEEP    . TOTAL HIP ARTHROPLASTY Right 11/12/2018   Procedure: TOTAL HIP ARTHROPLASTY ANTERIOR APPROACH;  Surgeon: Lovell Sheehan, MD;  Location: [facility] ORS;  Service: Orthopedics;  Laterality: Right;  . TOTAL VAGINAL HYSTERECTOMY      There were no vitals filed for this visit.  Subjective Assessment - 02/02/19 0813    Subjective  Pt has no updates today. She reports a good weekend and general success with HEP at home. She forget to take pain meds prior to therapy today    Pertinent History  Pt presents s/p right anterior total hip arthroplasty on 11/12/18.  She has completed 2 sessions of PT at another facility, however, her  treatment sessions were not one-on-one, and she was not getting the feedback she was looking for on her exercises.    Currently in Pain?  Yes    Pain Score  4        INTERVENTION THIS DATE -overground AMB walking, gait training no assistive device 0.85ms antalgic with altered trunk motor patterns -STS from chair, hands free 1x20  -Hooklying butterfly pectineus stretch 3x60 bilat (cued to use timer at home for consistency) -supine bilat hip long adductor stretch, hip joint neutral in sagittal plane 2x60sec -Hooklying bridge 2x15 -Reverse curlup (hooklying bilat hip flexion to >90 degrees) 2x10x5secH, cued for wide knees to avoid pectineus dominance at right hip)  -hooklying band clam 2x10 c BlueTB, ball between feet -Quadruped hip extension RLE 1x5, not tolerated due to groin spasm  -side stepping with GreenTBat knees 1x498fbilat (can be upgraded to blueTB next visit)     PT Short Term Goals - 12/31/18 1035      PT SHORT TERM GOAL #1   Title  Pt will be independent with HEP in order to improve strength and decrease back pain in order to improve pain-free function at home and work.    Baseline  12/2:  HEP compliant    Time  6    Period  Weeks    Status  Partially Met    Target Date  01/07/19        PT Long Term Goals - 12/31/18 1052      PT LONG TERM GOAL #1   Title  Pt will increase her LEFS to greater than 60/80 in order to demonstrate a clinically significant change in order to improve her ability to perform ADLs and IADLs with less difficulty and to improve her quality of life.    Baseline  11/26/18: 9/80 12/2: 25/80    Time  12    Period  Weeks    Status  Partially Met    Target Date  02/18/19      PT LONG TERM GOAL #2   Title  Pt will improve her R hip ROM to within 10 degrees of her L hip ROM  in order to improve her ability to perform ADLs and IADLs with less difficulty and to improve her quality of life.    Baseline  11/26/18: 28 degrees of R hip IR, 12 degrees of  R hip ER, 108 degrees of hip flexion, 20 deg of hip abduction, and -2 deg of hip extension; 12/03/18: L hip assessed today for comparison: L hip IR 45 deg, L hip ER 40 deg, L hip flexion 124 deg, L hip abduction 40 deg, PROM form hip extension L 25 deg, R 8 deg 12/2: R hip : flexion 114, abductoin 31 degrees, extension 11 degrees    Time  12    Period  Weeks    Status  Partially Met    Target Date  02/18/19      PT LONG TERM GOAL #3   Title  Pt will improve her R hip strength to a 4/5 in order to improve her ability to perform ADLs and IADLs with less difficulty and to improve her quality of life.    Baseline  11/26/18: 3+/5 hip flexion, 2+/5 hip ER (limited by pain and ROM), 3+/5 on hip IR, 3+/5 on hip abduction, 2+/5 on hip extension, 5/5 on knee extension, and 4/5 on knee flexion; 12/03/18: L hip strength assesessed today for comparison: 4+/5 Hip flexion, 5/5 Hip ER in sitting, 5/5 Hip IR in sitting, 4/5 Hip abduction (reports pain on R hip from sidelying position), 4/5 Hip extension, 5/5 Knee extension, 4/5 Knee flexion 12/2: R hip flexion, abduction, and adduction 4-/5 hip extension 3-/5    Time  12    Period  Weeks    Status  Partially Met    Target Date  02/18/19      PT LONG TERM GOAL #4   Title  Pt will decrease worst hip pain as reported on NPRS by at least 3 points in order to demonstrate clinically significant reduction in hip pain.    Baseline  11/23/18: 9/10 12/2: 5/10    Time  12    Period  Weeks    Status  Partially Met    Target Date  02/18/19            Plan - 02/02/19 0817    Clinical Impression Statement  Continued with curren tplan of care, general strengthening and ROM of the Rt hip. Pt continues to have fluctuating limitations from pain, but able to complete entire session as planned. Pt has continued pain in Right groin in session, little avail from stretches, however, noted relief during banded clam. Pt continues to  generally make progress in many areas, but pain  maintains excessive antalgic motor patterning. Pt unable to tolerate quadruped hip extension due to groin spasm during eccentric phase.    Rehab Potential  Poor    PT Frequency  2x / week    PT Duration  12 weeks    PT Treatment/Interventions  Cryotherapy;Electrical Stimulation;Iontophoresis 27m/ml Dexamethasone;Moist Heat;Ultrasound;DME Instruction;Gait training;Stair training;Functional mobility training;Therapeutic activities;Therapeutic exercise;Balance training;Neuromuscular re-education;Patient/family education;Manual techniques;Scar mobilization;Passive range of motion;Dry needling;Taping;Joint Manipulations;ADLs/Self Care Home Management;Aquatic Therapy;Canalith Repostioning;Vestibular;Spinal Manipulations    PT Next Visit Plan  progress strength and ROM per protocol    PT Home Exercise Plan  [email or web address]    Consulted and Agree with Plan of Care  Patient       Patient will benefit from skilled therapeutic intervention in order to improve the following deficits and impairments:  Decreased balance, Decreased endurance, Decreased range of motion, Pain, Decreased strength, Abnormal gait  Visit Diagnosis: Pain in right hip  Muscle weakness (generalized)     Problem List Patient Active Problem List   Diagnosis Date Noted  . Pain in right hip 01/12/2019  . Osteonecrosis of right hip ([place] 11/12/2018  . Vitamin D deficiency 02/25/2018  . Anemia, unspecified 01/08/2017  . Chronic pain 10/24/2015  . Hot flashes 10/24/2015  . Mood changes 10/24/2015  . Migraine without aura and without status migrainosus, not intractable 10/24/2015  . Metabolic syndrome 030/16/0109 . Dyslipidemia 10/24/2015  . Allergic rhinitis, seasonal 05/17/2015  . Asthma, mild intermittent 08/08/2014  . Chronic insomnia 08/08/2014  . DDD (degenerative disc disease), lumbar 08/08/2014  . Mood disorder ([place] 08/08/2014  . Gastro-esophageal reflux disease without esophagitis 08/08/2014  . H/O total hysterectomy  08/08/2014  . Irritable bowel syndrome with diarrhea 08/08/2014  . Osteoarthritis of right hip 08/08/2014  . Adult BMI 30+ 08/08/2014  . SBO (spina bifida occulta) 08/08/2014   8:42 AM, 02/02/19 AEtta Grandchild PT, DPT Physical Therapist - [facility]- [phone]    BEtta Grandchild1/04/2019, 8:20 AM  [place] [facility]Ber[facility] CenterSERV[facility] [address] [address]Phone: [phone]  Fax:  [phone] Name: Shelby KIRKENDOLLMRN: [id number] of Birth: 305-23-1972

## 2019-02-04 ENCOUNTER — Ambulatory Visit: Payer: No Typology Code available for payment source | Admitting: Physical Therapy

## 2019-02-09 ENCOUNTER — Encounter: Payer: Self-pay | Admitting: Physical Therapy

## 2019-02-09 ENCOUNTER — Other Ambulatory Visit: Payer: Self-pay

## 2019-02-09 ENCOUNTER — Ambulatory Visit: Payer: No Typology Code available for payment source | Admitting: Physical Therapy

## 2019-02-09 DIAGNOSIS — M25551 Pain in right hip: Secondary | ICD-10-CM | POA: Diagnosis not present

## 2019-02-09 DIAGNOSIS — M6281 Muscle weakness (generalized): Secondary | ICD-10-CM

## 2019-02-09 NOTE — Therapy (Signed)
[place] MAIN [facility] SERVICES [address] [place], [place], [address] Phone: [phone]   Fax:  [phone]  Physical Therapy Treatment Physical Therapy Progress Note   Dates of reporting period  12/31/18  to   02/09/19   Patient Details  Name: Shelby Henson MRN: [id number] Date of Birth: 1970/04/21 Referring Provider (PT): Dr. Harlow Mares   Encounter Date: 02/09/2019  PT End of Session - 02/09/19 0812    Visit Number  20    Number of Visits  25    Date for PT Re-Evaluation  02/18/19    Authorization Type  last progress note 12/31/18    PT Start Time  0805    PT Stop Time  0850    PT Time Calculation (min)  45 min    Equipment Utilized During Treatment  Gait belt    Activity Tolerance  Patient tolerated treatment well;No increased pain    Behavior During Therapy  WFL for tasks assessed/performed       Past Medical History:  Diagnosis Date  . Anemia   . Asthma    well controlled  . Complication of anesthesia   . Depression   . GERD (gastroesophageal reflux disease)    occ  . Migraine   . Osteoarthritis   . PONV (postoperative nausea and vomiting)    with c-section x 1    Past Surgical History:  Procedure Laterality Date  . ABDOMINAL HYSTERECTOMY    . ABLATION    . BREAST CYST ASPIRATION Left   . CESAREAN SECTION    . CESAREAN SECTION    . CESAREAN SECTION    . LAPAROSCOPY  2018  . LEEP    . TOTAL HIP ARTHROPLASTY Right 11/12/2018   Procedure: TOTAL HIP ARTHROPLASTY ANTERIOR APPROACH;  Surgeon: Lovell Sheehan, MD;  Location: [facility] ORS;  Service: Orthopedics;  Laterality: Right;  . TOTAL VAGINAL HYSTERECTOMY      There were no vitals filed for this visit.  Subjective Assessment - 02/09/19 0811    Subjective  Patient reports increased soreness this morning after doing a lot of walking and standing yesterday. She reports shopping yesterday and having to stand in line for a while which could have made it more sore. She  reports adherence with HEP;    Pertinent History  Pt presents s/p right anterior total hip arthroplasty on 11/12/18.  She has completed 2 sessions of PT at another facility, however, her treatment sessions were not one-on-one, and she was not getting the feedback she was looking for on her exercises.    Currently in Pain?  Yes    Pain Score  5     Pain Location  Hip    Pain Orientation  Right;Anterior    Pain Descriptors / Indicators  Aching;Sore    Pain Type  Surgical pain    Pain Onset  More than a month ago    Pain Frequency  Intermittent    Aggravating Factors   prolonged standing/walking    Pain Relieving Factors  ice/stretch    Effect of Pain on Daily Activities  pushes through the pain;    Multiple Pain Sites  No         [facility] PT Assessment - 02/09/19 0001      Observation/Other Assessments   Lower Extremity Functional Scale   22/80 (the lower the score the greater the disability, no significant change from 12/31/18 which was 25/80      AROM   Right  Hip Extension  18    Right Hip Flexion  115    Right Hip ABduction  40    Left Hip Extension  18    Left Hip Flexion  121    Left Hip ABduction  52      Strength   Right Hip Flexion  4-/5    Right Hip Extension  4/5    Right Hip ABduction  4-/5    Right Hip ADduction  4/5    Left Hip Flexion  5/5    Left Hip Extension  4-/5    Left Hip ABduction  4+/5    Left Hip ADduction  4/5       TREATMENT: Warm up on crosstrainer level 2 x4 min (Unbilled);  Exercises: Standing: In parallel bars: - forward lunge to BOSU x15 reps each LE with B rail assist;  -Standing hip flexion march against green tband x15 bilaterally with B rail assist and cues to keep erect posture for better stance control;  -forward step up on BOSU with contralateral LE hip flexion x10 reps each LE with B rail assist for safety;   Side stepping with squat with red tband around thighs x10 meter x1 laps each direction;   Standing with red tband around  BLE knees: -squat with hip abduction in forward, side, backward diagonals x5 reps each LE unsupported with min VCs for proper positioning;    Single leg heel raises x15 with and without rail assist x1 set each LE with decreased control noted on RLE; Single leg on airex: -contralateral ball circles x15 reps each LE with CGA for safety; -SLS with ball toss and catch x10 reps with min A for safety;  Patient exhibits increased instability during SLS reporting slight discomfort in RLE with SLS. She required CGA to min A for safety for balance control. Required min VCs to improve hip positioning for better muscle activation and stabilization;   PT assessed LE ROM, strength to address goals, see above;   Patient response/clinical impression: The patient continued to demonstrate high motivation to participate in therapy.Advanced LE strengthening focusing on proximal hip strengthening in all directions. Patient requires min VCS for proper positioning and exercise technique for optimal muscle activation. She reports continued soreness in anterior right hip but states that its not worse. Reinforced HEP   Patient's condition has the potential to improve in response to therapy. Maximum improvement is yet to be obtained. The anticipated improvement is attainable and reasonable in a generally predictable time.  Patient reports adherence with HEP. She is still having a catching pain in anterior right hip which she reports is worse with prolonged standing/walking. She reports having to take a lot of breaks with housework after 10 min of work due to increased hip pain. She is also still having difficulty with sleeping having trouble getting into a comfortable position;                      PT Education - 02/09/19 0812    Education Details  exercise/HEP    Person(s) Educated  Patient    Methods  Explanation;Verbal cues    Comprehension  Verbalized understanding;Returned  demonstration;Verbal cues required;Need further instruction       PT Short Term Goals - 02/09/19 0813      PT SHORT TERM GOAL #1   Title  Pt will be independent with HEP in order to improve strength and decrease back pain in order to improve pain-free function at home  and work.    Baseline  12/2: HEP compliant    Time  6    Period  Weeks    Status  Achieved    Target Date  01/07/19        PT Long Term Goals - 02/09/19 0813      PT LONG TERM GOAL #1   Title  Pt will increase her LEFS to greater than 60/80 in order to demonstrate a clinically significant change in order to improve her ability to perform ADLs and IADLs with less difficulty and to improve her quality of life.    Baseline  11/26/18: 9/80 12/2: 25/80, 02/09/19: 22/80    Time  12    Period  Weeks    Status  Not Met    Target Date  02/18/19      PT LONG TERM GOAL #2   Title  Pt will improve her R hip ROM to within 10 degrees of her L hip ROM  in order to improve her ability to perform ADLs and IADLs with less difficulty and to improve her quality of life.    Baseline  11/26/18: 28 degrees of R hip IR, 12 degrees of R hip ER, 108 degrees of hip flexion, 20 deg of hip abduction, and -2 deg of hip extension; 12/03/18: L hip assessed today for comparison: L hip IR 45 deg, L hip ER 40 deg, L hip flexion 124 deg, L hip abduction 40 deg, PROM form hip extension L 25 deg, R 8 deg 12/2: R hip : flexion 114, abductoin 31 degrees, extension 11 degrees, 02/09/19: see flowsheet    Time  12    Period  Weeks    Status  Achieved    Target Date  02/18/19      PT LONG TERM GOAL #3   Title  Pt will improve her R hip strength to a 4/5 in order to improve her ability to perform ADLs and IADLs with less difficulty and to improve her quality of life.    Baseline  11/26/18: 3+/5 hip flexion, 2+/5 hip ER (limited by pain and ROM), 3+/5 on hip IR, 3+/5 on hip abduction, 2+/5 on hip extension, 5/5 on knee extension, and 4/5 on knee flexion; 12/03/18:  L hip strength assesessed today for comparison: 4+/5 Hip flexion, 5/5 Hip ER in sitting, 5/5 Hip IR in sitting, 4/5 Hip abduction (reports pain on R hip from sidelying position), 4/5 Hip extension, 5/5 Knee extension, 4/5 Knee flexion 12/2: R hip flexion, abduction, and adduction 4-/5 hip extension 3-/5, 02/09/19: see flowsheet    Time  12    Period  Weeks    Status  Partially Met    Target Date  02/18/19      PT LONG TERM GOAL #4   Title  Pt will decrease worst hip pain as reported on NPRS by at least 3 points in order to demonstrate clinically significant reduction in hip pain.    Baseline  11/23/18: 9/10 12/2: 5/10, 02/09/19: 6/10    Time  12    Period  Weeks    Status  Partially Met    Target Date  02/18/19            Plan - 02/09/19 0900    Clinical Impression Statement  Patient motivated and participated well within session. Advanced LE strengthening with increased resistance to challenge proximal hip strength. Also instructed patient in SLS to challenge stance control and improve stability with closed kinetic chain activity. Patient  reports slight increased discomfort in anterior hip which is worse with weight bearing activities. Patient is progressing in strength and ROM although is still limited on right side. She would benefit from additional skilled PT intervention to improve strength, balance and mobility;    Personal Factors and Comorbidities  Comorbidity 3+    Comorbidities  DDD, migraine, IBS,    Examination-Activity Limitations  Bend;Lift;Squat;Caring for Others;Locomotion Level;Stairs;Carry;Stand    Examination-Participation Restrictions  Interpersonal Relationship;Yard Work;Cleaning;Laundry;Community Activity;Shop;Driving;Meal Prep    Rehab Potential  Good    PT Frequency  2x / week    PT Duration  12 weeks    PT Treatment/Interventions  Cryotherapy;Electrical Stimulation;Iontophoresis 54m/ml Dexamethasone;Moist Heat;Ultrasound;DME Instruction;Gait training;Stair  training;Functional mobility training;Therapeutic activities;Therapeutic exercise;Balance training;Neuromuscular re-education;Patient/family education;Manual techniques;Scar mobilization;Passive range of motion;Dry needling;Taping;Joint Manipulations;ADLs/Self Care Home Management;Aquatic Therapy;Canalith Repostioning;Vestibular;Spinal Manipulations    PT Next Visit Plan  progress strength and ROM per protocol    PT Home Exercise Plan  [email or web address]    Consulted and Agree with Plan of Care  Patient       Patient will benefit from skilled therapeutic intervention in order to improve the following deficits and impairments:  Decreased balance, Decreased endurance, Decreased range of motion, Pain, Decreased strength, Abnormal gait  Visit Diagnosis: Pain in right hip  Muscle weakness (generalized)     Problem List Patient Active Problem List   Diagnosis Date Noted  . Pain in right hip 01/12/2019  . Osteonecrosis of right hip ([place] 11/12/2018  . Vitamin D deficiency 02/25/2018  . Anemia, unspecified 01/08/2017  . Chronic pain 10/24/2015  . Hot flashes 10/24/2015  . Mood changes 10/24/2015  . Migraine without aura and without status migrainosus, not intractable 10/24/2015  . Metabolic syndrome 058/83/2549 . Dyslipidemia 10/24/2015  . Allergic rhinitis, seasonal 05/17/2015  . Asthma, mild intermittent 08/08/2014  . Chronic insomnia 08/08/2014  . DDD (degenerative disc disease), lumbar 08/08/2014  . Mood disorder ([place] 08/08/2014  . Gastro-esophageal reflux disease without esophagitis 08/08/2014  . H/O total hysterectomy 08/08/2014  . Irritable bowel syndrome with diarrhea 08/08/2014  . Osteoarthritis of right hip 08/08/2014  . Adult BMI 30+ 08/08/2014  . SBO (spina bifida occulta) 08/08/2014    Ramil Edgington PT, DPT 02/09/2019, 9:04 AM  CLiebenthalMAIN RGarfield [facility] HospitalSERVICES [address]RNad[address] [place] [address]Phone: [phone]  Fax:   [phone] Name: GBETRICE WANATMRN: [id number] of Birth: [birth year]

## 2019-02-11 ENCOUNTER — Other Ambulatory Visit: Payer: Self-pay

## 2019-02-11 ENCOUNTER — Encounter: Payer: Self-pay | Admitting: Physical Therapy

## 2019-02-11 ENCOUNTER — Ambulatory Visit: Payer: No Typology Code available for payment source | Admitting: Physical Therapy

## 2019-02-11 DIAGNOSIS — M25551 Pain in right hip: Secondary | ICD-10-CM

## 2019-02-11 DIAGNOSIS — M6281 Muscle weakness (generalized): Secondary | ICD-10-CM

## 2019-02-11 NOTE — Therapy (Signed)
[place] MAIN [facility] SERVICES [address] [place], [place], [address] Phone: [phone]   Fax:  [phone]  Physical Therapy Treatment  Patient Details  Name: Shelby Henson MRN: [id number] Date of Birth: [birth year] Referring Provider (PT): Dr. Harlow Mares   Encounter Date: 02/11/2019  PT End of Session - 02/11/19 0803    Visit Number  21    Number of Visits  25    Date for PT Re-Evaluation  02/18/19    Authorization Type  last progress note 12/31/18    PT Start Time  0802    PT Stop Time  0845    PT Time Calculation (min)  43 min    Equipment Utilized During Treatment  Gait belt    Activity Tolerance  Patient tolerated treatment well;No increased pain    Behavior During Therapy  WFL for tasks assessed/performed       Past Medical History:  Diagnosis Date  . Anemia   . Asthma    well controlled  . Complication of anesthesia   . Depression   . GERD (gastroesophageal reflux disease)    occ  . Migraine   . Osteoarthritis   . PONV (postoperative nausea and vomiting)    with c-section x 1    Past Surgical History:  Procedure Laterality Date  . ABDOMINAL HYSTERECTOMY    . ABLATION    . BREAST CYST ASPIRATION Left   . CESAREAN SECTION    . CESAREAN SECTION    . CESAREAN SECTION    . LAPAROSCOPY  2018  . LEEP    . TOTAL HIP ARTHROPLASTY Right 11/12/2018   Procedure: TOTAL HIP ARTHROPLASTY ANTERIOR APPROACH;  Surgeon: Lovell Sheehan, MD;  Location: [facility] ORS;  Service: Orthopedics;  Laterality: Right;  . TOTAL VAGINAL HYSTERECTOMY      There were no vitals filed for this visit.  Subjective Assessment - 02/11/19 0802    Subjective  Patient reports continued pain which was worse last night and is better this morning. She reports still having trouble sleeping at night due to increased pain;    Pertinent History  Pt presents s/p right anterior total hip arthroplasty on 11/12/18.  She has completed 2 sessions of PT at another facility,  however, her treatment sessions were not one-on-one, and she was not getting the feedback she was looking for on her exercises.    Currently in Pain?  Yes    Pain Score  4     Pain Location  Hip    Pain Orientation  Right;Anterior    Pain Descriptors / Indicators  Aching;Sore    Pain Type  Surgical pain    Pain Onset  More than a month ago    Pain Frequency  Intermittent    Aggravating Factors   prolonged standing/walking    Pain Relieving Factors  ice/stretch    Effect of Pain on Daily Activities  pushes through pain;    Multiple Pain Sites  No            TREATMENT: Warm up on crosstrainer level 2 x4 min (Unbilled);  Exercises: Standing: Beside stairs: - forward/backward lunge x5 reps each, each LE with 1 rail assist and CGA for safety and min VCs for proper foot placement and positioning; Patient had increased difficulty with RLE due to weakness and instability;   -forward step up on 6 inch step with contralateral LE hip flexion against green tband x15 reps each LE with intermittent rail assist for  safety; She had increased difficulty balancing on RLE requiring increased rail assist on that side.   Side stepping with squat with red tband around thighs x10 meter x2 laps each direction; Required min VCs to sit back in hips for better squat control;   Standing with red tband around BLE ankles: Star SLS kick outs x5 reps x2 sets each LE in semi-circle, required min A for safety and cues for correct exercise technique;   Sit<>Stand from regular chair with yellow weighted ball overhead press x10 reps with good positioning and motor control;   Side stepping from one cone to another (approximately 6 feet apart) and then side lunge to touch cone x10 reps each direction with cues to increase speed for increased agility and dynamic control; Patient had increased difficulty bending down to RLE due to catch in anterior hip; Required supervision for safety;   Patient prone: PT performed  passive quad stretch 30 sec hold x2 reps each LE with towel roll under knee for increased hip flexor stretch; Gluteal max hip extension x10 reps each LE with cues for proper positioning to increase motor control and improve gluteal activation;     NMR: Single leg heel raises x15 with and without rail assist x1 set each LE with decreased control noted on RLE; Single leg on airex: -contralateral ball circles x10 reps clockwise/counterclockwise each LE with CGA for safety; -SLS with ball pass side/side x10 reps with min A for safety;  Patient exhibits increased instability during SLS reporting slight discomfort in RLE with SLS. She required CGA to min A for safety for balance control. Required min VCs to improve hip positioning for better muscle activation and stabilization;      Patient response/clinical impression: The patient continued to demonstrate high motivation to participate in therapy.Advanced LE strengthening focusing on proximal hip strengthening in all directions. Patient requires min VCS for proper positioning and exercise technique for optimal muscle activation. She reports continued soreness in anterior right hip but states that its not worse. Reinforced HEP                     PT Education - 02/11/19 0803    Education Details  exercise/HEP    Person(s) Educated  Patient    Methods  Explanation;Verbal cues    Comprehension  Verbalized understanding;Returned demonstration;Verbal cues required;Need further instruction       PT Short Term Goals - 02/09/19 0813      PT SHORT TERM GOAL #1   Title  Pt will be independent with HEP in order to improve strength and decrease back pain in order to improve pain-free function at home and work.    Baseline  12/2: HEP compliant    Time  6    Period  Weeks    Status  Achieved    Target Date  01/07/19        PT Long Term Goals - 02/09/19 0813      PT LONG TERM GOAL #1   Title  Pt will increase her LEFS  to greater than 60/80 in order to demonstrate a clinically significant change in order to improve her ability to perform ADLs and IADLs with less difficulty and to improve her quality of life.    Baseline  11/26/18: 9/80 12/2: 25/80, 02/09/19: 22/80    Time  12    Period  Weeks    Status  Not Met    Target Date  02/18/19      PT  LONG TERM GOAL #2   Title  Pt will improve her R hip ROM to within 10 degrees of her L hip ROM  in order to improve her ability to perform ADLs and IADLs with less difficulty and to improve her quality of life.    Baseline  11/26/18: 28 degrees of R hip IR, 12 degrees of R hip ER, 108 degrees of hip flexion, 20 deg of hip abduction, and -2 deg of hip extension; 12/03/18: L hip assessed today for comparison: L hip IR 45 deg, L hip ER 40 deg, L hip flexion 124 deg, L hip abduction 40 deg, PROM form hip extension L 25 deg, R 8 deg 12/2: R hip : flexion 114, abductoin 31 degrees, extension 11 degrees, 02/09/19: see flowsheet    Time  12    Period  Weeks    Status  Achieved    Target Date  02/18/19      PT LONG TERM GOAL #3   Title  Pt will improve her R hip strength to a 4/5 in order to improve her ability to perform ADLs and IADLs with less difficulty and to improve her quality of life.    Baseline  11/26/18: 3+/5 hip flexion, 2+/5 hip ER (limited by pain and ROM), 3+/5 on hip IR, 3+/5 on hip abduction, 2+/5 on hip extension, 5/5 on knee extension, and 4/5 on knee flexion; 12/03/18: L hip strength assesessed today for comparison: 4+/5 Hip flexion, 5/5 Hip ER in sitting, 5/5 Hip IR in sitting, 4/5 Hip abduction (reports pain on R hip from sidelying position), 4/5 Hip extension, 5/5 Knee extension, 4/5 Knee flexion 12/2: R hip flexion, abduction, and adduction 4-/5 hip extension 3-/5, 02/09/19: see flowsheet    Time  12    Period  Weeks    Status  Partially Met    Target Date  02/18/19      PT LONG TERM GOAL #4   Title  Pt will decrease worst hip pain as reported on NPRS by at  least 3 points in order to demonstrate clinically significant reduction in hip pain.    Baseline  11/23/18: 9/10 12/2: 5/10, 02/09/19: 6/10    Time  12    Period  Weeks    Status  Partially Met    Target Date  02/18/19            Plan - 02/11/19 0841    Clinical Impression Statement  Patient motivated and participated well within session. Instructed patient in advanced LE strengthening to challenge hip flexor,extensor and abductor strength. Patient does require min VCs for correct positioning and exercise technique. She had increased difficulty with RLE SLS tasks due to instability and weakness. She required CGA to min A with advanced SLS tasks due to instability; Patient would benefit from additional skilled PT intervention to improve strength, balance and gait safety;    Personal Factors and Comorbidities  Comorbidity 3+    Comorbidities  DDD, migraine, IBS,    Examination-Activity Limitations  Bend;Lift;Squat;Caring for Others;Locomotion Level;Stairs;Carry;Stand    Examination-Participation Restrictions  Interpersonal Relationship;Yard Work;Cleaning;Laundry;Community Activity;Shop;Driving;Meal Prep    Rehab Potential  Good    PT Frequency  2x / week    PT Duration  12 weeks    PT Treatment/Interventions  Cryotherapy;Electrical Stimulation;Iontophoresis 37m/ml Dexamethasone;Moist Heat;Ultrasound;DME Instruction;Gait training;Stair training;Functional mobility training;Therapeutic activities;Therapeutic exercise;Balance training;Neuromuscular re-education;Patient/family education;Manual techniques;Scar mobilization;Passive range of motion;Dry needling;Taping;Joint Manipulations;ADLs/Self Care Home Management;Aquatic Therapy;Canalith Repostioning;Vestibular;Spinal Manipulations    PT Next Visit Plan  progress strength and  ROM per protocol    PT Home Exercise Plan  [email or web address]    Consulted and Agree with Plan of Care  Patient       Patient will benefit from skilled therapeutic intervention  in order to improve the following deficits and impairments:  Decreased balance, Decreased endurance, Decreased range of motion, Pain, Decreased strength, Abnormal gait  Visit Diagnosis: Pain in right hip  Muscle weakness (generalized)     Problem List Patient Active Problem List   Diagnosis Date Noted  . Pain in right hip 01/12/2019  . Osteonecrosis of right hip ([place]) 11/12/2018  . Vitamin D deficiency 02/25/2018  . Anemia, unspecified 01/08/2017  . Chronic pain 10/24/2015  . Hot flashes 10/24/2015  . Mood changes 10/24/2015  . Migraine without aura and without status migrainosus, not intractable 10/24/2015  . Metabolic syndrome 18/48/5927  . Dyslipidemia 10/24/2015  . Allergic rhinitis, seasonal 05/17/2015  . Asthma, mild intermittent 08/08/2014  . Chronic insomnia 08/08/2014  . DDD (degenerative disc disease), lumbar 08/08/2014  . Mood disorder ([place]) 08/08/2014  . Gastro-esophageal reflux disease without esophagitis 08/08/2014  . H/O total hysterectomy 08/08/2014  . Irritable bowel syndrome with diarrhea 08/08/2014  . Osteoarthritis of right hip 08/08/2014  . Adult BMI 30+ 08/08/2014  . SBO (spina bifida occulta) 08/08/2014    Corde Antonini PT, DPT 02/11/2019, 8:48 AM  [place] MAIN [facility] SERVICES [address] [place], [place], [address] Phone: [phone]   Fax:  [phone]  Name: ROJEAN IGE MRN: [id number] Date of Birth: Feb 11, 1970

## 2019-02-16 ENCOUNTER — Ambulatory Visit: Payer: No Typology Code available for payment source | Admitting: Physical Therapy

## 2019-02-18 ENCOUNTER — Encounter: Payer: No Typology Code available for payment source | Admitting: Physical Therapy

## 2019-02-23 ENCOUNTER — Encounter: Payer: No Typology Code available for payment source | Admitting: Physical Therapy

## 2019-02-25 ENCOUNTER — Encounter: Payer: No Typology Code available for payment source | Admitting: Physical Therapy

## 2019-03-02 ENCOUNTER — Ambulatory Visit: Payer: No Typology Code available for payment source | Attending: Orthopedic Surgery | Admitting: Physical Therapy

## 2019-03-02 ENCOUNTER — Other Ambulatory Visit: Payer: Self-pay

## 2019-03-02 DIAGNOSIS — M6281 Muscle weakness (generalized): Secondary | ICD-10-CM | POA: Diagnosis present

## 2019-03-02 DIAGNOSIS — M25551 Pain in right hip: Secondary | ICD-10-CM | POA: Insufficient documentation

## 2019-03-02 NOTE — Therapy (Signed)
[place] MAIN [facility] SERVICES [address] [place], [place], [address] Phone: [phone]   Fax:  [phone]  Physical Therapy Treatment  Patient Details  Name: Shelby Henson MRN: [id number] Date of Birth: 01/28/1971 Referring Provider (PT): Dr. Harlow Mares   Encounter Date: 03/02/2019  PT End of Session - 03/02/19 0854    Visit Number  22    Number of Visits  33    Date for PT Re-Evaluation  03/30/19    Authorization Type  last progress note 12/31/18    PT Start Time  0844    PT Stop Time  0930    PT Time Calculation (min)  46 min    Equipment Utilized During Treatment  Gait belt    Activity Tolerance  Patient tolerated treatment well;No increased pain    Behavior During Therapy  WFL for tasks assessed/performed       Past Medical History:  Diagnosis Date  . Anemia   . Asthma    well controlled  . Complication of anesthesia   . Depression   . GERD (gastroesophageal reflux disease)    occ  . Migraine   . Osteoarthritis   . PONV (postoperative nausea and vomiting)    with c-section x 1    Past Surgical History:  Procedure Laterality Date  . ABDOMINAL HYSTERECTOMY    . ABLATION    . BREAST CYST ASPIRATION Left   . CESAREAN SECTION    . CESAREAN SECTION    . CESAREAN SECTION    . LAPAROSCOPY  2018  . LEEP    . TOTAL HIP ARTHROPLASTY Right 11/12/2018   Procedure: TOTAL HIP ARTHROPLASTY ANTERIOR APPROACH;  Surgeon: Lovell Sheehan, MD;  Location: [facility] ORS;  Service: Orthopedics;  Laterality: Right;  . TOTAL VAGINAL HYSTERECTOMY      There were no vitals filed for this visit.  Subjective Assessment - 03/02/19 0852    Subjective  Patient reports increased soreness this weekend from the cold/wet weather. Patient reports overall doing a little bette. She has been doing her exercises at home. She had a telemedicine visit with her orthopedic who has cleared her for light duty but would like her to continue with therapy 2x a week for  now.    Pertinent History  Pt presents s/p right anterior total hip arthroplasty on 11/12/18.  She has completed 2 sessions of PT at another facility, however, her treatment sessions were not one-on-one, and she was not getting the feedback she was looking for on her exercises.    Currently in Pain?  Yes    Pain Score  3     Pain Location  Hip    Pain Orientation  Right;Anterior    Pain Descriptors / Indicators  Aching;Sore    Pain Type  Surgical pain    Pain Onset  More than a month ago    Pain Frequency  Intermittent    Aggravating Factors   prolonged standing/walking    Pain Relieving Factors  ice/stretch    Effect of Pain on Daily Activities  pushes through pain    Multiple Pain Sites  No            TREATMENT: Warm up ontreadmill 2.0 mph with 2 HHA x5 min (unbilled);  Exercises: Standing: Beside treadmill: - forward/backward lunge x5 reps each, each LE with 1 rail assist and CGA for safety and min VCs for proper foot placement and positioning; Patient had increased difficulty with RLE due to  weakness and instability;   Progressed to side lunges with yellow weighted ball chest press x10 reps each LE with min VCs for proper positioning and exercise technique for optimal strengthening and safety;    Side stepping with squat with green tband around thighs x10 meter x2laps each direction; Required min VCs to sit back in hips for better squat control;   Standing withgreentband around BLE ankles: Star SLS kick outs x5 reps x2 sets each LE in semi-circle, required min A for safety and cues for correct exercise technique;   Side stepping from one cone to another (approximately 6 feet apart) and then side lunge to touch cone x10 reps each direction with cues to increase speed for increased agility and dynamic control; Patient had increased difficulty bending down to RLE due to catch in anterior hip; Required supervision for safety;   Standing at matrix: -hip flexion march  7.5# 2x15 with min Vcs for proper positioning to improve hip strengthening;   Patient supine: RLE hip flexor stretch with leg off table 30 sec hold x2 reps; PT instructed patient in lumbar trunk rotation x5 reps each direction PT instructed in passive modified piriformis stretch 30 sec hold x2 reps; Patient required min A to achieve position and reports moderate stretch;   NMR: Single leg heel raises x15 with and without rail assist x1 set each LE with decreased control noted on RLE; Single leg on airex: -unsupported standing 15 sec hold x2 reps each LE -SLS with BUE arm raises x10 reps, CGA for safety; Patient exhibits increased stability during SLS being able to hold position with less unsteadiness and better ankle control;   Side step over line with contralateral leg lift (SLS) 3 sec hold x5 reps each direction Progressing to slight hop over line with contralateral leg lift (SLS) x10 reps each direction Advanced HEP with single leg hop for increased agility and stability into LE;      Patient response/clinical impression: The patient continued to demonstrate high motivation to participate in therapy.Advanced LE strengthening focusing on proximal hip strengthening in all directions. Patient requires min VCS for proper positioning and exercise technique for optimal muscle activation. She reports continued soreness in anterior right hip but states that its not worse. Progressed balance exercise with single leg hop and SLS activities. She does exhibit improved ankle control with SLS tasks.                          PT Education - 03/02/19 0854    Education Details  exercise/HEP    Person(s) Educated  Patient    Methods  Explanation;Verbal cues    Comprehension  Verbalized understanding;Returned demonstration;Verbal cues required;Need further instruction       PT Short Term Goals - 02/09/19 0813      PT SHORT TERM GOAL #1   Title  Pt will be independent  with HEP in order to improve strength and decrease back pain in order to improve pain-free function at home and work.    Baseline  12/2: HEP compliant    Time  6    Period  Weeks    Status  Achieved    Target Date  01/07/19        PT Long Term Goals - 02/09/19 0813      PT LONG TERM GOAL #1   Title  Pt will increase her LEFS to greater than 60/80 in order to demonstrate a clinically significant change in order to  improve her ability to perform ADLs and IADLs with less difficulty and to improve her quality of life.    Baseline  11/26/18: 9/80 12/2: 25/80, 02/09/19: 22/80    Time  12    Period  Weeks    Status  Not Met    Target Date  02/18/19      PT LONG TERM GOAL #2   Title  Pt will improve her R hip ROM to within 10 degrees of her L hip ROM  in order to improve her ability to perform ADLs and IADLs with less difficulty and to improve her quality of life.    Baseline  11/26/18: 28 degrees of R hip IR, 12 degrees of R hip ER, 108 degrees of hip flexion, 20 deg of hip abduction, and -2 deg of hip extension; 12/03/18: L hip assessed today for comparison: L hip IR 45 deg, L hip ER 40 deg, L hip flexion 124 deg, L hip abduction 40 deg, PROM form hip extension L 25 deg, R 8 deg 12/2: R hip : flexion 114, abductoin 31 degrees, extension 11 degrees, 02/09/19: see flowsheet    Time  12    Period  Weeks    Status  Achieved    Target Date  02/18/19      PT LONG TERM GOAL #3   Title  Pt will improve her R hip strength to a 4/5 in order to improve her ability to perform ADLs and IADLs with less difficulty and to improve her quality of life.    Baseline  11/26/18: 3+/5 hip flexion, 2+/5 hip ER (limited by pain and ROM), 3+/5 on hip IR, 3+/5 on hip abduction, 2+/5 on hip extension, 5/5 on knee extension, and 4/5 on knee flexion; 12/03/18: L hip strength assesessed today for comparison: 4+/5 Hip flexion, 5/5 Hip ER in sitting, 5/5 Hip IR in sitting, 4/5 Hip abduction (reports pain on R hip from sidelying  position), 4/5 Hip extension, 5/5 Knee extension, 4/5 Knee flexion 12/2: R hip flexion, abduction, and adduction 4-/5 hip extension 3-/5, 02/09/19: see flowsheet    Time  12    Period  Weeks    Status  Partially Met    Target Date  02/18/19      PT LONG TERM GOAL #4   Title  Pt will decrease worst hip pain as reported on NPRS by at least 3 points in order to demonstrate clinically significant reduction in hip pain.    Baseline  11/23/18: 9/10 12/2: 5/10, 02/09/19: 6/10    Time  12    Period  Weeks    Status  Partially Met    Target Date  02/18/19            Plan - 03/02/19 0940    Clinical Impression Statement  Patient motivated and participated well within session. She tolerated session well being able to do advanced strengthening without increase in hip discomfort. Patient requires min VCs for correct exercise technique and positioning. Progressed balance exercise with side hop with better SLS ability. Patient would benefit from additional skilled PT intervention to improve strength, balance and gait safety;    Personal Factors and Comorbidities  Comorbidity 3+    Comorbidities  DDD, migraine, IBS,    Examination-Activity Limitations  Bend;Lift;Squat;Caring for Others;Locomotion Level;Stairs;Carry;Stand    Examination-Participation Restrictions  Interpersonal Relationship;Yard Work;Cleaning;Laundry;Community Activity;Shop;Driving;Meal Prep    Rehab Potential  Good    PT Frequency  2x / week    PT Duration  12  weeks    PT Treatment/Interventions  Cryotherapy;Electrical Stimulation;Iontophoresis 2m/ml Dexamethasone;Moist Heat;Ultrasound;DME Instruction;Gait training;Stair training;Functional mobility training;Therapeutic activities;Therapeutic exercise;Balance training;Neuromuscular re-education;Patient/family education;Manual techniques;Scar mobilization;Passive range of motion;Dry needling;Taping;Joint Manipulations;ADLs/Self Care Home Management;Aquatic Therapy;Canalith  Repostioning;Vestibular;Spinal Manipulations    PT Next Visit Plan  progress strength and ROM per protocol    PT Home Exercise Plan  [email or web address]    Consulted and Agree with Plan of Care  Patient       Patient will benefit from skilled therapeutic intervention in order to improve the following deficits and impairments:  Decreased balance, Decreased endurance, Decreased range of motion, Pain, Decreased strength, Abnormal gait  Visit Diagnosis: Pain in right hip  Muscle weakness (generalized)     Problem List Patient Active Problem List   Diagnosis Date Noted  . Pain in right hip 01/12/2019  . [place] 11/12[place]  . Vitamin D deficiency 02/25/2018  . Anemia, unspecified 01/08/2017  . Chronic pain 10/24/2015  . Hot flashes 10/24/2015  . Mood changes 10/24/2015  . Migraine without aura and without status migrainosus, not intractable 10/24/2015  . Metabolic syndrome 021/71/1654 . Dyslipidemia 10/24/2015  . Allergic rhinitis, seasonal 05/17/2015  . Asthma, mild intermittent 08/08/2014  . Chronic insomnia 08/08/2014  . DDD (degenerative disc disease), lumbar 08/08/2014  . Mood disorder ([place] 08/08/2014  . Gastro-esophageal reflux disease without esophagitis 08/08/2014  . H/O total hysterectomy 08/08/2014  . Irritable bowel syndrome with diarrhea 08/08/2014  . Osteoarthritis of right hip 08/08/2014  . Adult BMI 30+ 08/08/2014  . SBO (spina bifida occulta) 08/08/2014    Shelby Henson PT, DPT 03/02/2019, 10:08 AM  [place] [facility] [address] [place] [address]Phone: [phone]  Fax:  [phone] Name: Shelby KAMMMRN: [id number] of Birth: [birth year]

## 2019-03-02 NOTE — Patient Instructions (Signed)
Bound: Lateral   Start beside incline. Jump, pushing up and out to side, gaining distance and height. Land on uphill foot only. Push off immediately and repeat downhill, landing on downhill foot only. Repeat _5-10__ times. Repeat on other side for set. Rest _2__ seconds after set. Do _1__ sets per session.  [email or web address]

## 2019-03-04 ENCOUNTER — Ambulatory Visit: Payer: No Typology Code available for payment source | Admitting: Physical Therapy

## 2019-03-04 ENCOUNTER — Other Ambulatory Visit: Payer: Self-pay

## 2019-03-04 ENCOUNTER — Encounter: Payer: Self-pay | Admitting: Physical Therapy

## 2019-03-04 DIAGNOSIS — M25551 Pain in right hip: Secondary | ICD-10-CM | POA: Diagnosis not present

## 2019-03-04 DIAGNOSIS — M6281 Muscle weakness (generalized): Secondary | ICD-10-CM

## 2019-03-04 NOTE — Therapy (Signed)
[place] MAIN [facility] SERVICES [address] [place], [place], [address] Phone: [phone]   Fax:  [phone]  Physical Therapy Treatment  Patient Details  Name: Shelby Henson MRN: [id number] Date of Birth: 1970/02/23 Referring Provider (PT): Dr. Harlow Mares   Encounter Date: 03/04/2019  PT End of Session - 03/04/19 0851    Visit Number  23    Number of Visits  33    Date for PT Re-Evaluation  03/30/19    Authorization Type  last progress note 12/31/18    PT Start Time  0846    PT Stop Time  0930    PT Time Calculation (min)  44 min    Equipment Utilized During Treatment  Gait belt    Activity Tolerance  Patient tolerated treatment well;No increased pain    Behavior During Therapy  WFL for tasks assessed/performed       Past Medical History:  Diagnosis Date  . Anemia   . Asthma    well controlled  . Complication of anesthesia   . Depression   . GERD (gastroesophageal reflux disease)    occ  . Migraine   . Osteoarthritis   . PONV (postoperative nausea and vomiting)    with c-section x 1    Past Surgical History:  Procedure Laterality Date  . ABDOMINAL HYSTERECTOMY    . ABLATION    . BREAST CYST ASPIRATION Left   . CESAREAN SECTION    . CESAREAN SECTION    . CESAREAN SECTION    . LAPAROSCOPY  2018  . LEEP    . TOTAL HIP ARTHROPLASTY Right 11/12/2018   Procedure: TOTAL HIP ARTHROPLASTY ANTERIOR APPROACH;  Surgeon: Lovell Sheehan, MD;  Location: [facility] ORS;  Service: Orthopedics;  Laterality: Right;  . TOTAL VAGINAL HYSTERECTOMY      There were no vitals filed for this visit.  Subjective Assessment - 03/04/19 0850    Subjective  Patient report increased fatigue. She reports having increased pain at night and just couldn't get comfortable last night for sleeping. She reports increased pain in RLE which is better this morning.    Pertinent History  Pt presents s/p right anterior total hip arthroplasty on 11/12/18.  She has  completed 2 sessions of PT at another facility, however, her treatment sessions were not one-on-one, and she was not getting the feedback she was looking for on her exercises.    Currently in Pain?  Yes    Pain Score  4     Pain Location  Leg    Pain Orientation  Right;Anterior;Lateral    Pain Descriptors / Indicators  Aching;Sore    Pain Type  Surgical pain    Pain Onset  More than a month ago    Pain Frequency  Intermittent    Aggravating Factors   worse at night    Pain Relieving Factors  stretch/movement    Effect of Pain on Daily Activities  pushes through pain;    Multiple Pain Sites  No           TREATMENT: Warm up on treadmill 2.0 mph with 2-0 HHA x4 min (unbilled);   Exercises: Standing: Beside steps: - forward lunge while holding ball, side/side rotation, x5 reps each, each LE without rail assist and CGA for safety and min VCs for proper foot placement and positioning; Patient had increased difficulty with RLE due to weakness and instability;    Progressed to side lunges with  ball chest press x10  reps each LE with min VCs for proper positioning and exercise technique for optimal strengthening and safety;    Forward step ups unsupported x5 reps on 4 inch step  Progressed to LE eccentric control forward, 2x5 from 4 inch step with finger tip hold for safety; required min VCS for proper positioning for better quad control; Does have increased difficulty on RLE;   Standing with green tband around BLE ankles: Star SLS kick outs x5 reps x2 sets each LE in semi-circle, required supervision for safety and cues for correct exercise technique;      Standing at matrix: -hip flexion march 7.5# 2x15 with min Vcs for proper positioning to improve hip strengthening;      NMR:  Rockerboard: Feet staggered, forward/backward teeter x2 min with 2-1-0 rail assist x1 set each foot in front;  Side/side teeter x2 min with finger tip hold, patient reports increased discomfort when  shifting to RLE Patient standing with feet apart, heel raises with finger tip hold x15 reps;  Finished with rolling stick to RLE thigh to help reduce delayed onset muscle soreness.        Patient response/clinical impression: The patient continued to demonstrate high motivation to participate in therapy. Advanced LE strengthening focusing on proximal hip strengthening in all directions. Patient requires min VCS for proper positioning and exercise technique for optimal muscle activation. She reports continued soreness in anterior right hip but states that its not worse. Progressed balance exercise with weight shifting on rockerboard to improve LE control. She does require intermittent rail assist especially with increased weight bearing in RLE.                    PT Education - 03/04/19 0851    Education Details  exercise, HEP    Person(s) Educated  Patient    Methods  Explanation;Verbal cues    Comprehension  Verbalized understanding;Returned demonstration;Verbal cues required;Need further instruction       PT Short Term Goals - 03/02/19 1009      PT SHORT TERM GOAL #1   Title  Pt will be independent with HEP in order to improve strength and decrease back pain in order to improve pain-free function at home and work.    Baseline  12/2: HEP compliant    Time  4    Period  Weeks    Status  Achieved    Target Date  03/30/19        PT Long Term Goals - 03/02/19 1008      PT LONG TERM GOAL #1   Title  Pt will increase her LEFS to greater than 60/80 in order to demonstrate a clinically significant change in order to improve her ability to perform ADLs and IADLs with less difficulty and to improve her quality of life.    Baseline  11/26/18: 9/80 12/2: 25/80, 02/09/19: 22/80    Time  4    Period  Weeks    Status  Not Met    Target Date  03/30/19      PT LONG TERM GOAL #2   Title  Pt will improve her R hip ROM to within 10 degrees of her L hip ROM  in order to improve  her ability to perform ADLs and IADLs with less difficulty and to improve her quality of life.    Baseline  11/26/18: 28 degrees of R hip IR, 12 degrees of R hip ER, 108 degrees of hip flexion, 20 deg of hip abduction,  and -2 deg of hip extension; 12/03/18: L hip assessed today for comparison: L hip IR 45 deg, L hip ER 40 deg, L hip flexion 124 deg, L hip abduction 40 deg, PROM form hip extension L 25 deg, R 8 deg 12/2: R hip : flexion 114, abductoin 31 degrees, extension 11 degrees, 02/09/19: see flowsheet    Time  4    Period  Weeks    Status  Achieved    Target Date  03/30/19      PT LONG TERM GOAL #3   Title  Pt will improve her R hip strength to a 4/5 in order to improve her ability to perform ADLs and IADLs with less difficulty and to improve her quality of life.    Baseline  11/26/18: 3+/5 hip flexion, 2+/5 hip ER (limited by pain and ROM), 3+/5 on hip IR, 3+/5 on hip abduction, 2+/5 on hip extension, 5/5 on knee extension, and 4/5 on knee flexion; 12/03/18: L hip strength assesessed today for comparison: 4+/5 Hip flexion, 5/5 Hip ER in sitting, 5/5 Hip IR in sitting, 4/5 Hip abduction (reports pain on R hip from sidelying position), 4/5 Hip extension, 5/5 Knee extension, 4/5 Knee flexion 12/2: R hip flexion, abduction, and adduction 4-/5 hip extension 3-/5, 02/09/19: see flowsheet    Time  4    Period  Weeks    Status  Partially Met    Target Date  03/30/19      PT LONG TERM GOAL #4   Title  Pt will decrease worst hip pain as reported on NPRS by at least 3 points in order to demonstrate clinically significant reduction in hip pain.    Baseline  11/23/18: 9/10 12/2: 5/10, 02/09/19: 6/10    Time  4    Period  Weeks    Status  Partially Met    Target Date  03/30/19            Plan - 03/04/19 0855    Clinical Impression Statement  Patient motivated and participated well within session. She was instructed in advanced LE strengthening in BLE. Patient does require min VCs for correct  exercise technique and positioning for optimal muscle activation. She does report slight soreness in RLE but reports its minimal. Patient also instructed in advanced balance tasks, progressing SLS tasks to improve stance control and stability. She did have difficulty with weight shifting activities that challenged quad control such as on rockerboard. She would benefit from additional skilled PT intervention to improve strength, balance and gait safety;    Personal Factors and Comorbidities  Comorbidity 3+    Comorbidities  DDD, migraine, IBS,    Examination-Activity Limitations  Bend;Lift;Squat;Caring for Others;Locomotion Level;Stairs;Carry;Stand    Examination-Participation Restrictions  Interpersonal Relationship;Yard Work;Cleaning;Laundry;Community Activity;Shop;Driving;Meal Prep    Rehab Potential  Good    PT Frequency  2x / week    PT Duration  4 weeks    PT Treatment/Interventions  Cryotherapy;Electrical Stimulation;Iontophoresis 52m/ml Dexamethasone;Moist Heat;Ultrasound;DME Instruction;Gait training;Stair training;Functional mobility training;Therapeutic activities;Therapeutic exercise;Balance training;Neuromuscular re-education;Patient/family education;Manual techniques;Scar mobilization;Passive range of motion;Dry needling;Taping;Joint Manipulations;ADLs/Self Care Home Management;Aquatic Therapy;Canalith Repostioning;Vestibular;Spinal Manipulations    PT Next Visit Plan  progress strength and ROM per protocol    PT Home Exercise Plan  [email or web address]    Consulted and Agree with Plan of Care  Patient       Patient will benefit from skilled therapeutic intervention in order to improve the following deficits and impairments:  Decreased balance, Decreased endurance, Decreased range of motion, Pain, Decreased  strength, Abnormal gait  Visit Diagnosis: Pain in right hip  Muscle weakness (generalized)     Problem List Patient Active Problem List   Diagnosis Date Noted  . Pain in right hip  01/12/2019  . Osteonecrosis of right hip ([place]) 11/12/2018  . Vitamin D deficiency 02/25/2018  . Anemia, unspecified 01/08/2017  . Chronic pain 10/24/2015  . Hot flashes 10/24/2015  . Mood changes 10/24/2015  . Migraine without aura and without status migrainosus, not intractable 10/24/2015  . Metabolic syndrome 37/03/3015  . Dyslipidemia 10/24/2015  . Allergic rhinitis, seasonal 05/17/2015  . Asthma, mild intermittent 08/08/2014  . Chronic insomnia 08/08/2014  . DDD (degenerative disc disease), lumbar 08/08/2014  . Mood disorder ([place]) 08/08/2014  . Gastro-esophageal reflux disease without esophagitis 08/08/2014  . H/O total hysterectomy 08/08/2014  . Irritable bowel syndrome with diarrhea 08/08/2014  . Osteoarthritis of right hip 08/08/2014  . Adult BMI 30+ 08/08/2014  . SBO (spina bifida occulta) 08/08/2014    Brieonna Crutcher PT, DPT 03/04/2019, 9:33 AM  [place] MAIN [facility] SERVICES [address] [place], [place], [address] Phone: [phone]   Fax:  [phone]  Name: HALA NARULA MRN: [id number] Date of Birth: 03-10-1970

## 2019-03-09 ENCOUNTER — Ambulatory Visit: Payer: No Typology Code available for payment source | Admitting: Physical Therapy

## 2019-03-11 ENCOUNTER — Encounter: Payer: Self-pay | Admitting: Physical Therapy

## 2019-03-11 ENCOUNTER — Other Ambulatory Visit: Payer: Self-pay

## 2019-03-11 ENCOUNTER — Ambulatory Visit: Payer: No Typology Code available for payment source | Admitting: Physical Therapy

## 2019-03-11 DIAGNOSIS — M6281 Muscle weakness (generalized): Secondary | ICD-10-CM

## 2019-03-11 DIAGNOSIS — M25551 Pain in right hip: Secondary | ICD-10-CM | POA: Diagnosis not present

## 2019-03-11 NOTE — Therapy (Signed)
[place] MAIN [facility] SERVICES [address] [place], [place], [address] Phone: [phone]   Fax:  [phone]  Physical Therapy Treatment  Patient Details  Name: Shelby Henson MRN: [id number] Date of Birth: Jan 09, 1971 Referring Provider (PT): Dr. Harlow Mares   Encounter Date: 03/11/2019  PT End of Session - 03/11/19 0853    Visit Number  24    Number of Visits  33    Date for PT Re-Evaluation  03/30/19    Authorization Type  last progress note 12/31/18    PT Start Time  0846    PT Stop Time  0930    PT Time Calculation (min)  44 min    Equipment Utilized During Treatment  Gait belt    Activity Tolerance  Patient tolerated treatment well;No increased pain    Behavior During Therapy  WFL for tasks assessed/performed       Past Medical History:  Diagnosis Date  . Anemia   . Asthma    well controlled  . Complication of anesthesia   . Depression   . GERD (gastroesophageal reflux disease)    occ  . Migraine   . Osteoarthritis   . PONV (postoperative nausea and vomiting)    with c-section x 1    Past Surgical History:  Procedure Laterality Date  . ABDOMINAL HYSTERECTOMY    . ABLATION    . BREAST CYST ASPIRATION Left   . CESAREAN SECTION    . CESAREAN SECTION    . CESAREAN SECTION    . LAPAROSCOPY  2018  . LEEP    . TOTAL HIP ARTHROPLASTY Right 11/12/2018   Procedure: TOTAL HIP ARTHROPLASTY ANTERIOR APPROACH;  Surgeon: Lovell Sheehan, MD;  Location: [facility] ORS;  Service: Orthopedics;  Laterality: Right;  . TOTAL VAGINAL HYSTERECTOMY      There were no vitals filed for this visit.  Subjective Assessment - 03/11/19 0851    Subjective  Patient reports still having trouble with sleeping at night. She is having increased pain and is more fatigued during the day. She also is having to take more rest breaks with activity due to pain; Reports minimal pain this morning;    Pertinent History  Pt presents s/p right anterior total hip  arthroplasty on 11/12/18.  She has completed 2 sessions of PT at another facility, however, her treatment sessions were not one-on-one, and she was not getting the feedback she was looking for on her exercises.    Currently in Pain?  Yes    Pain Score  3     Pain Location  Hip    Pain Orientation  Right;Anterior;Lateral    Pain Descriptors / Indicators  Aching;Sore    Pain Type  Surgical pain    Pain Onset  More than a month ago    Pain Frequency  Intermittent    Aggravating Factors   worse at night    Pain Relieving Factors  stretch/movement    Effect of Pain on Daily Activities  pushes through the pain;    Multiple Pain Sites  No               TREATMENT: Warm up on treadmill 2.0 mph with 2-0 HHA x4 min (unbilled);   Exercises: Leg press: BLE 85# x15 with min Vcs for proper positioning; RLE only 40# x20 with min VCs for proper positioning including to avoid knee valgus position;    Standing at steps,  LE eccentric control forward, x10 from 4 inch  step with finger tip hold for safety; required min VCS for proper positioning for better quad control; Does have increased difficulty on RLE;   Standing with green tband around BLE ankles: Star SLS kick outs x5 reps x2 sets each LE in semi-circle, required supervision for safety and cues for correct exercise technique;      Standing at matrix: -hip flexion march 7.5# 2x15 with min Vcs for proper positioning to improve hip strengthening;   -Forward lunges against resistance, 12.5# x3 laps with regular backwards walking eccentric return, required CGA for safety with slight unsteadiness due to slow lunges; -side stepping gait crossovers forward/backward x2 laps each direction, 12.5# with CGA for safety;    PT identified increased tightness along RLE IT band; Instructed patient in IT band stretches to facilitate increased flexibility; See patient instructions;    Finished with rolling stick to RLE thigh to help reduce delayed onset  muscle soreness.        Patient response/clinical impression: The patient continued to demonstrate high motivation to participate in therapy. Advanced LE strengthening focusing on proximal hip strengthening in all directions. Patient requires min VCS for proper positioning and exercise technique for optimal muscle activation. She reports continued soreness in anterior right hip but states that its not worse.                  PT Education - 03/11/19 [id number]    Education Details  exercise, HEP    Person(s) Educated  Patient    Methods  Explanation;Verbal cues    Comprehension  Verbalized understanding;Returned demonstration;Verbal cues required;Need further instruction       PT Short Term Goals - 03/02/19 1009      PT SHORT TERM GOAL #1   Title  Pt will be independent with HEP in order to improve strength and decrease back pain in order to improve pain-free function at home and work.    Baseline  12/2: HEP compliant    Time  4    Period  Weeks    Status  Achieved    Target Date  03/30/19        PT Long Term Goals - 03/02/19 1008      PT LONG TERM GOAL #1   Title  Pt will increase her LEFS to greater than 60/80 in order to demonstrate a clinically significant change in order to improve her ability to perform ADLs and IADLs with less difficulty and to improve her quality of life.    Baseline  11/26/18: 9/80 12/2: 25/80, 02/09/19: 22/80    Time  4    Period  Weeks    Status  Not Met    Target Date  03/30/19      PT LONG TERM GOAL #2   Title  Pt will improve her R hip ROM to within 10 degrees of her L hip ROM  in order to improve her ability to perform ADLs and IADLs with less difficulty and to improve her quality of life.    Baseline  11/26/18: 28 degrees of R hip IR, 12 degrees of R hip ER, 108 degrees of hip flexion, 20 deg of hip abduction, and -2 deg of hip extension; 12/03/18: L hip assessed today for comparison: L hip IR 45 deg, L hip ER 40 deg, L hip flexion 124  deg, L hip abduction 40 deg, PROM form hip extension L 25 deg, R 8 deg 12/2: R hip : flexion 114, abductoin 31 degrees, extension 11 degrees, 02/09/19:  see flowsheet    Time  4    Period  Weeks    Status  Achieved    Target Date  03/30/19      PT LONG TERM GOAL #3   Title  Pt will improve her R hip strength to a 4/5 in order to improve her ability to perform ADLs and IADLs with less difficulty and to improve her quality of life.    Baseline  11/26/18: 3+/5 hip flexion, 2+/5 hip ER (limited by pain and ROM), 3+/5 on hip IR, 3+/5 on hip abduction, 2+/5 on hip extension, 5/5 on knee extension, and 4/5 on knee flexion; 12/03/18: L hip strength assesessed today for comparison: 4+/5 Hip flexion, 5/5 Hip ER in sitting, 5/5 Hip IR in sitting, 4/5 Hip abduction (reports pain on R hip from sidelying position), 4/5 Hip extension, 5/5 Knee extension, 4/5 Knee flexion 12/2: R hip flexion, abduction, and adduction 4-/5 hip extension 3-/5, 02/09/19: see flowsheet    Time  4    Period  Weeks    Status  Partially Met    Target Date  03/30/19      PT LONG TERM GOAL #4   Title  Pt will decrease worst hip pain as reported on NPRS by at least 3 points in order to demonstrate clinically significant reduction in hip pain.    Baseline  11/23/18: 9/10 12/2: 5/10, 02/09/19: 6/10    Time  4    Period  Weeks    Status  Partially Met    Target Date  03/30/19            Plan - 03/11/19 0946    Clinical Impression Statement  Patient motivated and participated well within session. Progressed LE strengthening with increased resistance/repetition. She did require CGA and min VCs for correct positioning with forward lunges on matrix machine. She continues to require HHA with forward step downs due to poor motor control and weakness with eccentric step downs. She did exhibit increased tightness along RLE IT band, educated patient in LE stretches and using tennis ball for myofascial release. She would benefit from additional  skilled PT intervention to improve strength, balance and mobility;    Personal Factors and Comorbidities  Comorbidity 3+    Comorbidities  DDD, migraine, IBS,    Examination-Activity Limitations  Bend;Lift;Squat;Caring for Others;Locomotion Level;Stairs;Carry;Stand    Examination-Participation Restrictions  Interpersonal Relationship;Yard Work;Cleaning;Laundry;Community Activity;Shop;Driving;Meal Prep    Rehab Potential  Good    PT Frequency  2x / week    PT Duration  4 weeks    PT Treatment/Interventions  Cryotherapy;Electrical Stimulation;Iontophoresis 13m/ml Dexamethasone;Moist Heat;Ultrasound;DME Instruction;Gait training;Stair training;Functional mobility training;Therapeutic activities;Therapeutic exercise;Balance training;Neuromuscular re-education;Patient/family education;Manual techniques;Scar mobilization;Passive range of motion;Dry needling;Taping;Joint Manipulations;ADLs/Self Care Home Management;Aquatic Therapy;Canalith Repostioning;Vestibular;Spinal Manipulations    PT Next Visit Plan  progress strength and ROM per protocol    PT Home Exercise Plan  [email or web address]    Consulted and Agree with Plan of Care  Patient       Patient will benefit from skilled therapeutic intervention in order to improve the following deficits and impairments:  Decreased balance, Decreased endurance, Decreased range of motion, Pain, Decreased strength, Abnormal gait  Visit Diagnosis: Pain in right hip  Muscle weakness (generalized)     Problem List Patient Active Problem List   Diagnosis Date Noted  . Pain in right hip 01/12/2019  . [place] of right [place] ([place] 11/12/2018  . Vitamin D deficiency 02/25/2018  . Anemia, unspecified 01/08/2017  . Chronic pain 10/24/2015  .  Hot flashes 10/24/2015  . Mood changes 10/24/2015  . Migraine without aura and without status migrainosus, not intractable 10/24/2015  . Metabolic syndrome 07/40/9796  . Dyslipidemia 10/24/2015  . Allergic rhinitis, seasonal  05/17/2015  . Asthma, mild intermittent 08/08/2014  . Chronic insomnia 08/08/2014  . DDD (degenerative disc disease), lumbar 08/08/2014  . Mood disorder ([place]) 08/08/2014  . Gastro-esophageal reflux disease without esophagitis 08/08/2014  . H/O total hysterectomy 08/08/2014  . Irritable bowel syndrome with diarrhea 08/08/2014  . Osteoarthritis of right hip 08/08/2014  . Adult BMI 30+ 08/08/2014  . SBO (spina bifida occulta) 08/08/2014    Olegario Emberson PT, DPT 03/11/2019, 9:48 AM  [place] MAIN [facility] SERVICES [address] [place], [place], [address] Phone: [phone]   Fax:  [phone]  Name: Shelby Henson MRN: [id number] Date of Birth: 1970-04-17

## 2019-03-11 NOTE — Patient Instructions (Signed)
Access Code: [email or web address]  URL: [email or web address]  Date: 03/11/2019  Prepared by: Blanche East   Exercises Standing ITB Stretch - 3 reps - 2 sets - 30 hold - 1x daily - 7x weekly Supine ITB Stretch - 3 reps - 2 sets - 30 hold - 1x daily - 7x weekly

## 2019-03-13 ENCOUNTER — Encounter: Payer: Self-pay | Admitting: Family Medicine

## 2019-03-16 ENCOUNTER — Ambulatory Visit: Payer: No Typology Code available for payment source | Admitting: Physical Therapy

## 2019-03-16 ENCOUNTER — Other Ambulatory Visit: Payer: Self-pay

## 2019-03-16 ENCOUNTER — Encounter: Payer: Self-pay | Admitting: Physical Therapy

## 2019-03-16 DIAGNOSIS — M25551 Pain in right hip: Secondary | ICD-10-CM | POA: Diagnosis not present

## 2019-03-16 DIAGNOSIS — M6281 Muscle weakness (generalized): Secondary | ICD-10-CM

## 2019-03-16 NOTE — Therapy (Signed)
[place] MAIN [facility] SERVICES [address] [place], [place], [address] Phone: [phone]   Fax:  [phone]  Physical Therapy Treatment  Patient Details  Name: Shelby Henson MRN: [id number] Date of Birth: 05-29-1970 Referring Provider (PT): Dr. Harlow Mares   Encounter Date: 03/16/2019  PT End of Session - 03/16/19 0929    Visit Number  25    Number of Visits  33    Date for PT Re-Evaluation  03/30/19    Authorization Type  last progress note 12/31/18    PT Start Time  0930    PT Stop Time  1015    PT Time Calculation (min)  45 min    Equipment Utilized During Treatment  Gait belt    Activity Tolerance  Patient tolerated treatment well;No increased pain    Behavior During Therapy  WFL for tasks assessed/performed       Past Medical History:  Diagnosis Date  . Anemia   . Asthma    well controlled  . Complication of anesthesia   . Depression   . GERD (gastroesophageal reflux disease)    occ  . Migraine   . Osteoarthritis   . PONV (postoperative nausea and vomiting)    with c-section x 1    Past Surgical History:  Procedure Laterality Date  . ABDOMINAL HYSTERECTOMY    . ABLATION    . BREAST CYST ASPIRATION Left   . CESAREAN SECTION    . CESAREAN SECTION    . CESAREAN SECTION    . LAPAROSCOPY  2018  . LEEP    . TOTAL HIP ARTHROPLASTY Right 11/12/2018   Procedure: TOTAL HIP ARTHROPLASTY ANTERIOR APPROACH;  Surgeon: Lovell Sheehan, MD;  Location: [facility] ORS;  Service: Orthopedics;  Laterality: Right;  . TOTAL VAGINAL HYSTERECTOMY      There were no vitals filed for this visit.  Subjective Assessment - 03/16/19 0933    Subjective  Patient reports returning back to work over the weekend. She reports feeling like her right leg is a little stronger. She is still having pain at night intermittently; Reports stretches are going well and helping;    Pertinent History  Pt presents s/p right anterior total hip arthroplasty on 11/12/18.   She has completed 2 sessions of PT at another facility, however, her treatment sessions were not one-on-one, and she was not getting the feedback she was looking for on her exercises.    Currently in Pain?  Yes    Pain Score  3     Pain Location  Hip    Pain Orientation  Right;Anterior    Pain Descriptors / Indicators  Aching;Sore    Pain Type  Surgical pain    Pain Onset  More than a month ago    Pain Frequency  Intermittent    Aggravating Factors   worse at night    Pain Relieving Factors  stretch/movement    Effect of Pain on Daily Activities  pushes through the pain;    Multiple Pain Sites  No             TREATMENT: Warm up ontreadmill 2.0 mph with 2-0HHA x38mn (unbilled);  Exercises: Leg press: BLE 85# x15 with min Vcs for proper positioning; RLE only 40# x20 with min VCs for proper positioning including to avoid knee valgus position;    Standing at steps,  LE eccentric control forward, x10 from 4 inch step with finger tip hold for safety; required min VCS for  proper positioning for better quad control; Does have increased difficulty on RLE;  Forward step ups with contralateral LE hip flexion march against green tband x15 reps each LE with min VCS for proper positioning and exercise technique;  Standing at matrix: -hip flexion march 12.5# 2x12 with min Vcs for proper positioning to improve hip strengthening; -Forward lunges against resistance, 12.5# x3 laps with regular backwards walking eccentric return, required CGA for safety with slight unsteadiness due to slow lunges; -side stepping gait crossovers forward/backward x2 laps each direction, 12.5# with CGA for safety;   Patient prone with 3# ankle weights: -gluteal max hip extension with knee flexed x15 reps bilaterally; Required min VCS to avoid trunk rotation to isolate gluteal activation;   Qped 3# ankle weights on:  Hip extension x10 reps bilaterally; Hip abduction/fire hydrant x10 reps  bilaterally; Patient required min VCs to keep hips in neutral and avoid trunk rotation for better hip stabilization;  Sidelying: -hip abduction SLR, 3# x15 reps bilaterally;   PT identified increased tightness along RLE IT band; Instructed patient in IT band stretches to facilitate increased flexibility; See patient instructions;   Finished with rolling stick to RLE thigh to help reduce delayed onset muscle soreness. Also educated pt in using ice/cryotherapy to help with pain control;    Patient response/clinical impression: The patient continued to demonstrate high motivation to participate in therapy.Advanced LE strengthening focusing on proximal hip strengthening in all directions. Patient requires min VCS for proper positioning and exercise technique for optimal muscle activation. She reports continued soreness in anterior right hip but states that its not worse.                     PT Education - 03/16/19 0929    Education Details  exercise, HEP    Person(s) Educated  Patient    Methods  Explanation;Verbal cues    Comprehension  Verbalized understanding;Returned demonstration;Verbal cues required;Need further instruction       PT Short Term Goals - 03/02/19 1009      PT SHORT TERM GOAL #1   Title  Pt will be independent with HEP in order to improve strength and decrease back pain in order to improve pain-free function at home and work.    Baseline  12/2: HEP compliant    Time  4    Period  Weeks    Status  Achieved    Target Date  03/30/19        PT Long Term Goals - 03/02/19 1008      PT LONG TERM GOAL #1   Title  Pt will increase her LEFS to greater than 60/80 in order to demonstrate a clinically significant change in order to improve her ability to perform ADLs and IADLs with less difficulty and to improve her quality of life.    Baseline  11/26/18: 9/80 12/2: 25/80, 02/09/19: 22/80    Time  4    Period  Weeks    Status  Not Met    Target  Date  03/30/19      PT LONG TERM GOAL #2   Title  Pt will improve her R hip ROM to within 10 degrees of her L hip ROM  in order to improve her ability to perform ADLs and IADLs with less difficulty and to improve her quality of life.    Baseline  11/26/18: 28 degrees of R hip IR, 12 degrees of R hip ER, 108 degrees of hip flexion, 20 deg  of hip abduction, and -2 deg of hip extension; 12/03/18: L hip assessed today for comparison: L hip IR 45 deg, L hip ER 40 deg, L hip flexion 124 deg, L hip abduction 40 deg, PROM form hip extension L 25 deg, R 8 deg 12/2: R hip : flexion 114, abductoin 31 degrees, extension 11 degrees, 02/09/19: see flowsheet    Time  4    Period  Weeks    Status  Achieved    Target Date  03/30/19      PT LONG TERM GOAL #3   Title  Pt will improve her R hip strength to a 4/5 in order to improve her ability to perform ADLs and IADLs with less difficulty and to improve her quality of life.    Baseline  11/26/18: 3+/5 hip flexion, 2+/5 hip ER (limited by pain and ROM), 3+/5 on hip IR, 3+/5 on hip abduction, 2+/5 on hip extension, 5/5 on knee extension, and 4/5 on knee flexion; 12/03/18: L hip strength assesessed today for comparison: 4+/5 Hip flexion, 5/5 Hip ER in sitting, 5/5 Hip IR in sitting, 4/5 Hip abduction (reports pain on R hip from sidelying position), 4/5 Hip extension, 5/5 Knee extension, 4/5 Knee flexion 12/2: R hip flexion, abduction, and adduction 4-/5 hip extension 3-/5, 02/09/19: see flowsheet    Time  4    Period  Weeks    Status  Partially Met    Target Date  03/30/19      PT LONG TERM GOAL #4   Title  Pt will decrease worst hip pain as reported on NPRS by at least 3 points in order to demonstrate clinically significant reduction in hip pain.    Baseline  11/23/18: 9/10 12/2: 5/10, 02/09/19: 6/10    Time  4    Period  Weeks    Status  Partially Met    Target Date  03/30/19            Plan - 03/16/19 0959    Clinical Impression Statement  Patient  motivated and participated well within session. Instructed patient in advanced LE strengthening. She was able to tolerate increased resistance and repetition. Patient continues to have intermittent catching in right hip requiring occasional repositioning. Denies any increase in pain at end of session. She would benefit from additional skilled PT intervention to improve strength, balance and gait safety;    Personal Factors and Comorbidities  Comorbidity 3+    Comorbidities  DDD, migraine, IBS,    Examination-Activity Limitations  Bend;Lift;Squat;Caring for Others;Locomotion Level;Stairs;Carry;Stand    Examination-Participation Restrictions  Interpersonal Relationship;Yard Work;Cleaning;Laundry;Community Activity;Shop;Driving;Meal Prep    Rehab Potential  Good    PT Frequency  2x / week    PT Duration  4 weeks    PT Treatment/Interventions  Cryotherapy;Electrical Stimulation;Iontophoresis 76m/ml Dexamethasone;Moist Heat;Ultrasound;DME Instruction;Gait training;Stair training;Functional mobility training;Therapeutic activities;Therapeutic exercise;Balance training;Neuromuscular re-education;Patient/family education;Manual techniques;Scar mobilization;Passive range of motion;Dry needling;Taping;Joint Manipulations;ADLs/Self Care Home Management;Aquatic Therapy;Canalith Repostioning;Vestibular;Spinal Manipulations    PT Next Visit Plan  progress strength and ROM per protocol    PT Home Exercise Plan  [email or web address]    Consulted and Agree with Plan of Care  Patient       Patient will benefit from skilled therapeutic intervention in order to improve the following deficits and impairments:  Decreased balance, Decreased endurance, Decreased range of motion, Pain, Decreased strength, Abnormal gait  Visit Diagnosis: Pain in right hip  Muscle weakness (generalized)     Problem List Patient Active Problem List   Diagnosis Date  Noted  . Pain in right hip 01/12/2019  . Osteonecrosis of right hip ([place])  11/12/2018  . Vitamin D deficiency 02/25/2018  . Anemia, unspecified 01/08/2017  . Chronic pain 10/24/2015  . Hot flashes 10/24/2015  . Mood changes 10/24/2015  . Migraine without aura and without status migrainosus, not intractable 10/24/2015  . Metabolic syndrome 92/42/6834  . Dyslipidemia 10/24/2015  . Allergic rhinitis, seasonal 05/17/2015  . Asthma, mild intermittent 08/08/2014  . Chronic insomnia 08/08/2014  . DDD (degenerative disc disease), lumbar 08/08/2014  . Mood disorder ([place]) 08/08/2014  . Gastro-esophageal reflux disease without esophagitis 08/08/2014  . H/O total hysterectomy 08/08/2014  . Irritable bowel syndrome with diarrhea 08/08/2014  . Osteoarthritis of right hip 08/08/2014  . Adult BMI 30+ 08/08/2014  . SBO (spina bifida occulta) 08/08/2014    Arabell Neria PT, DPT 03/16/2019, 12:02 PM  [place] MAIN [facility] [address] [place], [place], [address] Phone: [phone]   Fax:  [phone]  Name: Shelby Henson MRN: [id number] Date of Birth: 07/26/1970

## 2019-03-18 ENCOUNTER — Other Ambulatory Visit: Payer: Self-pay

## 2019-03-18 ENCOUNTER — Encounter: Payer: Self-pay | Admitting: Physical Therapy

## 2019-03-18 ENCOUNTER — Ambulatory Visit: Payer: No Typology Code available for payment source | Admitting: Physical Therapy

## 2019-03-18 DIAGNOSIS — M25551 Pain in right hip: Secondary | ICD-10-CM | POA: Diagnosis not present

## 2019-03-18 DIAGNOSIS — M6281 Muscle weakness (generalized): Secondary | ICD-10-CM

## 2019-03-18 NOTE — Therapy (Signed)
[place] MAIN [facility] SERVICES [address] [place], [place], [address] Phone: [phone]   Fax:  [phone]  Physical Therapy Treatment  Patient Details  Name: Shelby Henson MRN: [id number] Date of Birth: [birth year] Referring Provider (PT): Dr. Harlow Mares   Encounter Date: 03/18/2019  PT End of Session - 03/18/19 0846    Visit Number  26    Number of Visits  33    Date for PT Re-Evaluation  03/30/19    Authorization Type  last progress note 12/31/18    PT Start Time  0846    PT Stop Time  0930    PT Time Calculation (min)  44 min    Equipment Utilized During Treatment  Gait belt    Activity Tolerance  Patient tolerated treatment well;No increased pain    Behavior During Therapy  WFL for tasks assessed/performed       Past Medical History:  Diagnosis Date  . Anemia   . Asthma    well controlled  . Complication of anesthesia   . Depression   . GERD (gastroesophageal reflux disease)    occ  . Migraine   . Osteoarthritis   . PONV (postoperative nausea and vomiting)    with c-section x 1    Past Surgical History:  Procedure Laterality Date  . ABDOMINAL HYSTERECTOMY    . ABLATION    . BREAST CYST ASPIRATION Left   . CESAREAN SECTION    . CESAREAN SECTION    . CESAREAN SECTION    . LAPAROSCOPY  2018  . LEEP    . TOTAL HIP ARTHROPLASTY Right 11/12/2018   Procedure: TOTAL HIP ARTHROPLASTY ANTERIOR APPROACH;  Surgeon: Lovell Sheehan, MD;  Location: [facility] ORS;  Service: Orthopedics;  Laterality: Right;  . TOTAL VAGINAL HYSTERECTOMY      There were no vitals filed for this visit.  Subjective Assessment - 03/18/19 0846    Subjective  Patient reports doing pretty well. She reports continued lateral hip pain despite massage;    Pertinent History  Pt presents s/p right anterior total hip arthroplasty on 11/12/18.  She has completed 2 sessions of PT at another facility, however, her treatment sessions were not one-on-one, and she was not  getting the feedback she was looking for on her exercises.    Currently in Pain?  Yes    Pain Score  4     Pain Location  Hip    Pain Orientation  Right;Lateral    Pain Descriptors / Indicators  Aching;Sore    Pain Type  Surgical pain    Pain Onset  More than a month ago    Pain Frequency  Intermittent    Aggravating Factors   worse at night    Pain Relieving Factors  stretch/movement    Effect of Pain on Daily Activities  pushes through the pain;    Multiple Pain Sites  No              TREATMENT: Warm up on treadmill 2.0 mph with 2-0 HHA x4 min (unbilled);   Exercises: Leg press: BLE 85# x15 with min Vcs for proper positioning; RLE only 40# x20 with min VCs for proper positioning including to avoid knee valgus position;    Standing at steps,  LE eccentric control forward, x10 from 4 inch step with finger tip hold for safety; required min VCS for proper positioning for better quad control; Does have increased difficulty on RLE;   Standing at matrix: -  hip flexion march 12.5# 2x15 with min Vcs for proper positioning to improve hip strengthening;  -Forward lunges against resistance, 12.5# x3 laps with regular backwards walking eccentric return, required CGA for safety with slight unsteadiness due to slow lunges; -side stepping gait crossovers forward/backward x2 laps each direction, 12.5# with CGA for safety;   Side stepping from one cone to another (approximately 6 feet apart) and then side lunge to touch cone x10 reps each direction with cues to increase speed for increased agility and dynamic control; Patient had increased difficulty bending down to RLE due to catch in anterior hip; Required supervision for safety;    Patient prone with 5# ankle weights: -gluteal max hip extension with knee flexed 2x5 reps bilaterally; Required min VCS to avoid trunk rotation to isolate gluteal activation;      PT identified increased tightness along RLE IT band; Instructed patient in IT  band stretches to facilitate increased flexibility, hooklying 30 sec hold x2 reps; RLE hanging off table hip flexor stretch 30 sec hold x1 reps;  PT finished with ice massage to right proximal lateral hip musculature including IT band and greater trochanter area x5 min;    Patient response/clinical impression: The patient continued to demonstrate high motivation to participate in therapy. Advanced LE strengthening focusing on proximal hip strengthening in all directions. Patient requires min VCS for proper positioning and exercise technique for optimal muscle activation. She reports occasional catching in right hip temporarily; Reports less pain at end of session reporting ice massage helped a lot.                   PT Education - 03/18/19 0845    Education Details  exercise, HEP    Person(s) Educated  Patient    Methods  Explanation;Verbal cues    Comprehension  Verbalized understanding;Returned demonstration;Verbal cues required;Need further instruction       PT Short Term Goals - 03/02/19 1009      PT SHORT TERM GOAL #1   Title  Pt will be independent with HEP in order to improve strength and decrease back pain in order to improve pain-free function at home and work.    Baseline  12/2: HEP compliant    Time  4    Period  Weeks    Status  Achieved    Target Date  03/30/19        PT Long Term Goals - 03/02/19 1008      PT LONG TERM GOAL #1   Title  Pt will increase her LEFS to greater than 60/80 in order to demonstrate a clinically significant change in order to improve her ability to perform ADLs and IADLs with less difficulty and to improve her quality of life.    Baseline  11/26/18: 9/80 12/2: 25/80, 02/09/19: 22/80    Time  4    Period  Weeks    Status  Not Met    Target Date  03/30/19      PT LONG TERM GOAL #2   Title  Pt will improve her R hip ROM to within 10 degrees of her L hip ROM  in order to improve her ability to perform ADLs and IADLs with less  difficulty and to improve her quality of life.    Baseline  11/26/18: 28 degrees of R hip IR, 12 degrees of R hip ER, 108 degrees of hip flexion, 20 deg of hip abduction, and -2 deg of hip extension; 12/03/18: L hip assessed today  for comparison: L hip IR 45 deg, L hip ER 40 deg, L hip flexion 124 deg, L hip abduction 40 deg, PROM form hip extension L 25 deg, R 8 deg 12/2: R hip : flexion 114, abductoin 31 degrees, extension 11 degrees, 02/09/19: see flowsheet    Time  4    Period  Weeks    Status  Achieved    Target Date  03/30/19      PT LONG TERM GOAL #3   Title  Pt will improve her R hip strength to a 4/5 in order to improve her ability to perform ADLs and IADLs with less difficulty and to improve her quality of life.    Baseline  11/26/18: 3+/5 hip flexion, 2+/5 hip ER (limited by pain and ROM), 3+/5 on hip IR, 3+/5 on hip abduction, 2+/5 on hip extension, 5/5 on knee extension, and 4/5 on knee flexion; 12/03/18: L hip strength assesessed today for comparison: 4+/5 Hip flexion, 5/5 Hip ER in sitting, 5/5 Hip IR in sitting, 4/5 Hip abduction (reports pain on R hip from sidelying position), 4/5 Hip extension, 5/5 Knee extension, 4/5 Knee flexion 12/2: R hip flexion, abduction, and adduction 4-/5 hip extension 3-/5, 02/09/19: see flowsheet    Time  4    Period  Weeks    Status  Partially Met    Target Date  03/30/19      PT LONG TERM GOAL #4   Title  Pt will decrease worst hip pain as reported on NPRS by at least 3 points in order to demonstrate clinically significant reduction in hip pain.    Baseline  11/23/18: 9/10 12/2: 5/10, 02/09/19: 6/10    Time  4    Period  Weeks    Status  Partially Met    Target Date  03/30/19            Plan - 03/18/19 0931    Clinical Impression Statement  Patient motivated and participated well within session. Progressed LE strengthening with increased repetition as tolerated. Patient requires min Vcs for correct exercise technique. She tolerated ice massage  well reporting significant reduction in LE pain at end of session. She would benefit from additional skilled PT intervention to improve strength, balance and gait safety;    Personal Factors and Comorbidities  Comorbidity 3+    Comorbidities  DDD, migraine, IBS,    Examination-Activity Limitations  Bend;Lift;Squat;Caring for Others;Locomotion Level;Stairs;Carry;Stand    Examination-Participation Restrictions  Interpersonal Relationship;Yard Work;Cleaning;Laundry;Community Activity;Shop;Driving;Meal Prep    Rehab Potential  Good    PT Frequency  2x / week    PT Duration  4 weeks    PT Treatment/Interventions  Cryotherapy;Electrical Stimulation;Iontophoresis 15m/ml Dexamethasone;Moist Heat;Ultrasound;DME Instruction;Gait training;Stair training;Functional mobility training;Therapeutic activities;Therapeutic exercise;Balance training;Neuromuscular re-education;Patient/family education;Manual techniques;Scar mobilization;Passive range of motion;Dry needling;Taping;Joint Manipulations;ADLs/Self Care Home Management;Aquatic Therapy;Canalith Repostioning;Vestibular;Spinal Manipulations    PT Next Visit Plan  progress strength and ROM per protocol    PT Home Exercise Plan  [email or web address]    Consulted and Agree with Plan of Care  Patient       Patient will benefit from skilled therapeutic intervention in order to improve the following deficits and impairments:  Decreased balance, Decreased endurance, Decreased range of motion, Pain, Decreased strength, Abnormal gait  Visit Diagnosis: Pain in right hip  Muscle weakness (generalized)     Problem List Patient Active Problem List   Diagnosis Date Noted  . Pain in right hip 01/12/2019  . Osteonecrosis of right hip ([place] 11/12/2018  . Vitamin  D deficiency 02/25/2018  . Anemia, unspecified 01/08/2017  . Chronic pain 10/24/2015  . Hot flashes 10/24/2015  . Mood changes 10/24/2015  . Migraine without aura and without status migrainosus, not intractable  10/24/2015  . Metabolic syndrome 83/41/9622  . Dyslipidemia 10/24/2015  . Allergic rhinitis, seasonal 05/17/2015  . Asthma, mild intermittent 08/08/2014  . Chronic insomnia 08/08/2014  . DDD (degenerative disc disease), lumbar 08/08/2014  . Mood disorder ([place]) 08/08/2014  . Gastro-esophageal reflux disease without esophagitis 08/08/2014  . H/O total hysterectomy 08/08/2014  . Irritable bowel syndrome with diarrhea 08/08/2014  . Osteoarthritis of right hip 08/08/2014  . Adult BMI 30+ 08/08/2014  . SBO (spina bifida occulta) 08/08/2014    Riley Hallum PT, DPT 03/18/2019, 9:32 AM  [place] MAIN [facility] SERVICES [address] [place], [place], [address] Phone: [phone]   Fax:  [phone]  Name: Shelby Henson MRN: [id number] Date of Birth: Jun 06, 1970

## 2019-03-23 ENCOUNTER — Encounter: Payer: Self-pay | Admitting: Physical Therapy

## 2019-03-23 ENCOUNTER — Ambulatory Visit: Payer: No Typology Code available for payment source | Admitting: Physical Therapy

## 2019-03-23 ENCOUNTER — Other Ambulatory Visit: Payer: Self-pay

## 2019-03-23 DIAGNOSIS — M25551 Pain in right hip: Secondary | ICD-10-CM | POA: Diagnosis not present

## 2019-03-23 DIAGNOSIS — M6281 Muscle weakness (generalized): Secondary | ICD-10-CM

## 2019-03-23 NOTE — Therapy (Signed)
[place] MAIN [facility] SERVICES [address] [place], [place], [address] Phone: [phone]   Fax:  [phone]  Physical Therapy Treatment  Patient Details  Name: Shelby Henson MRN: [id number] Date of Birth: Aug 04, 1970 Referring Provider (PT): Dr. Harlow Mares   Encounter Date: 03/23/2019  PT End of Session - 03/23/19 0949    Visit Number  27    Number of Visits  33    Date for PT Re-Evaluation  03/30/19    Authorization Type  last progress note 12/31/18    PT Start Time  0932    PT Stop Time  1015    PT Time Calculation (min)  43 min    Equipment Utilized During Treatment  Gait belt    Activity Tolerance  Patient tolerated treatment well;No increased pain    Behavior During Therapy  WFL for tasks assessed/performed       Past Medical History:  Diagnosis Date  . Anemia   . Asthma    well controlled  . Complication of anesthesia   . Depression   . GERD (gastroesophageal reflux disease)    occ  . Migraine   . Osteoarthritis   . PONV (postoperative nausea and vomiting)    with c-section x 1    Past Surgical History:  Procedure Laterality Date  . ABDOMINAL HYSTERECTOMY    . ABLATION    . BREAST CYST ASPIRATION Left   . CESAREAN SECTION    . CESAREAN SECTION    . CESAREAN SECTION    . LAPAROSCOPY  2018  . LEEP    . TOTAL HIP ARTHROPLASTY Right 11/12/2018   Procedure: TOTAL HIP ARTHROPLASTY ANTERIOR APPROACH;  Surgeon: Lovell Sheehan, MD;  Location: [facility] ORS;  Service: Orthopedics;  Laterality: Right;  . TOTAL VAGINAL HYSTERECTOMY      There were no vitals filed for this visit.  Subjective Assessment - 03/23/19 0939    Subjective  Pt reports increased right hip pain over the weekend especially at night. Reduced pain with movement;    Pertinent History  Pt presents s/p right anterior total hip arthroplasty on 11/12/18.  She has completed 2 sessions of PT at another facility, however, her treatment sessions were not one-on-one, and  she was not getting the feedback she was looking for on her exercises.    Currently in Pain?  Yes    Pain Score  4     Pain Location  Hip    Pain Orientation  Right;Anterior    Pain Descriptors / Indicators  Aching;Sore    Pain Type  Surgical pain    Pain Onset  More than a month ago    Pain Frequency  Intermittent    Aggravating Factors   worse at night    Pain Relieving Factors  stretch/movement    Effect of Pain on Daily Activities  pushes through the pain;    Multiple Pain Sites  No               TREATMENT: Warm up on treadmill 2.0 mph with 2-0 HHA x4 min (unbilled);   Exercises: Leg press: RLE only 55# 2x12 with min VCs for proper positioning including to avoid knee valgus position;      Standing at matrix:  -Forward lunges against resistance, 12.5# x3 laps with regular backwards walking eccentric return, required CGA for safety with slight unsteadiness due to slow lunges; -side stepping with squat x10 feet x 1 laps each direction with 12.5# resistance;   -  single leg donkey kicks 12.5# x15 each LE with min VCs for positioning;    Side stepping from one cone to another (approximately 6 feet apart) and then side lunge to touch cone x10 reps each direction with cues to increase speed for increased agility and dynamic control; Patient had increased difficulty bending down to RLE due to catch in anterior hip; Required supervision for safety;    SLS on firm surface:  -heel raises x15 reps each LE  -mini squat with finger tip hold x10 reps each LE Required min VCs for correct positioning and to improve LE control for better muscle strengthening;     Standing on BOSU: -forward step ups with contralateral LE lift x10 reps each LE progressing with finger tip hold to unsupported, CGA -Mini squat with feet in neutral unsupported head turns side/side x5 reps; Progressing to mini squat with ball pass side/side x10 reps with CGA for safety;  PT identified increased tightness  along RLE IT band; Instructed patient in IT band stretches to facilitate increased flexibility, hooklying 30 sec hold x2 reps; Sidelying RLE passive hip flexor stretch 30 sec hold x1 rep;    PT finished with ice massage to right proximal anterior hip musculature along incision to help reduce discomfort and improve scar tissue mobility x5 min;    Patient response/clinical impression: The patient continued to demonstrate high motivation to participate in therapy. Advanced LE strengthening focusing on proximal hip strengthening in all directions. Patient requires min VCS for proper positioning and exercise technique for optimal muscle activation. She reports occasional catching in right hip temporarily; Reports less pain at end of session reporting ice massage helped.                  PT Education - 03/23/19 0949    Education Details  exercise/HEP    Person(s) Educated  Patient    Methods  Explanation;Verbal cues    Comprehension  Verbalized understanding;Returned demonstration;Verbal cues required       PT Short Term Goals - 03/02/19 1009      PT SHORT TERM GOAL #1   Title  Pt will be independent with HEP in order to improve strength and decrease back pain in order to improve pain-free function at home and work.    Baseline  12/2: HEP compliant    Time  4    Period  Weeks    Status  Achieved    Target Date  03/30/19        PT Long Term Goals - 03/02/19 1008      PT LONG TERM GOAL #1   Title  Pt will increase her LEFS to greater than 60/80 in order to demonstrate a clinically significant change in order to improve her ability to perform ADLs and IADLs with less difficulty and to improve her quality of life.    Baseline  11/26/18: 9/80 12/2: 25/80, 02/09/19: 22/80    Time  4    Period  Weeks    Status  Not Met    Target Date  03/30/19      PT LONG TERM GOAL #2   Title  Pt will improve her R hip ROM to within 10 degrees of her L hip ROM  in order to improve her ability  to perform ADLs and IADLs with less difficulty and to improve her quality of life.    Baseline  11/26/18: 28 degrees of R hip IR, 12 degrees of R hip ER, 108 degrees of hip  flexion, 20 deg of hip abduction, and -2 deg of hip extension; 12/03/18: L hip assessed today for comparison: L hip IR 45 deg, L hip ER 40 deg, L hip flexion 124 deg, L hip abduction 40 deg, PROM form hip extension L 25 deg, R 8 deg 12/2: R hip : flexion 114, abductoin 31 degrees, extension 11 degrees, 02/09/19: see flowsheet    Time  4    Period  Weeks    Status  Achieved    Target Date  03/30/19      PT LONG TERM GOAL #3   Title  Pt will improve her R hip strength to a 4/5 in order to improve her ability to perform ADLs and IADLs with less difficulty and to improve her quality of life.    Baseline  11/26/18: 3+/5 hip flexion, 2+/5 hip ER (limited by pain and ROM), 3+/5 on hip IR, 3+/5 on hip abduction, 2+/5 on hip extension, 5/5 on knee extension, and 4/5 on knee flexion; 12/03/18: L hip strength assesessed today for comparison: 4+/5 Hip flexion, 5/5 Hip ER in sitting, 5/5 Hip IR in sitting, 4/5 Hip abduction (reports pain on R hip from sidelying position), 4/5 Hip extension, 5/5 Knee extension, 4/5 Knee flexion 12/2: R hip flexion, abduction, and adduction 4-/5 hip extension 3-/5, 02/09/19: see flowsheet    Time  4    Period  Weeks    Status  Partially Met    Target Date  03/30/19      PT LONG TERM GOAL #4   Title  Pt will decrease worst hip pain as reported on NPRS by at least 3 points in order to demonstrate clinically significant reduction in hip pain.    Baseline  11/23/18: 9/10 12/2: 5/10, 02/09/19: 6/10    Time  4    Period  Weeks    Status  Partially Met    Target Date  03/30/19            Plan - 03/23/19 1017    Clinical Impression Statement  Patient motivated and participated well within session. She was instructed in advanced LE strengthening exercise, progressing resistance/repetition. Patient able to  progress well with SLS tasks and BOSU activities to improve motor control and strength. She would benefit from additional skilled PT intervention to improve strength, balance and gait safety;    Personal Factors and Comorbidities  Comorbidity 3+    Comorbidities  DDD, migraine, IBS,    Examination-Activity Limitations  Bend;Lift;Squat;Caring for Others;Locomotion Level;Stairs;Carry;Stand    Examination-Participation Restrictions  Interpersonal Relationship;Yard Work;Cleaning;Laundry;Community Activity;Shop;Driving;Meal Prep    Rehab Potential  Good    PT Frequency  2x / week    PT Duration  4 weeks    PT Treatment/Interventions  Cryotherapy;Electrical Stimulation;Iontophoresis 50m/ml Dexamethasone;Moist Heat;Ultrasound;DME Instruction;Gait training;Stair training;Functional mobility training;Therapeutic activities;Therapeutic exercise;Balance training;Neuromuscular re-education;Patient/family education;Manual techniques;Scar mobilization;Passive range of motion;Dry needling;Taping;Joint Manipulations;ADLs/Self Care Home Management;Aquatic Therapy;Canalith Repostioning;Vestibular;Spinal Manipulations    PT Next Visit Plan  progress strength and ROM per protocol    PT Home Exercise Plan  [email or web address]    Consulted and Agree with Plan of Care  Patient       Patient will benefit from skilled therapeutic intervention in order to improve the following deficits and impairments:  Decreased balance, Decreased endurance, Decreased range of motion, Pain, Decreased strength, Abnormal gait  Visit Diagnosis: Pain in right hip  Muscle weakness (generalized)     Problem List Patient Active Problem List   Diagnosis Date Noted  . Pain in right  hip 01/12/2019  . Osteonecrosis of right hip ([place]) 11/12/2018  . Vitamin D deficiency 02/25/2018  . Anemia, unspecified 01/08/2017  . Chronic pain 10/24/2015  . Hot flashes 10/24/2015  . Mood changes 10/24/2015  . Migraine without aura and without status  migrainosus, not intractable 10/24/2015  . Metabolic syndrome 56/12/5481  . Dyslipidemia 10/24/2015  . Allergic rhinitis, seasonal 05/17/2015  . Asthma, mild intermittent 08/08/2014  . Chronic insomnia 08/08/2014  . DDD (degenerative disc disease), lumbar 08/08/2014  . Mood disorder ([place]) 08/08/2014  . Gastro-esophageal reflux disease without esophagitis 08/08/2014  . H/O total hysterectomy 08/08/2014  . Irritable bowel syndrome with diarrhea 08/08/2014  . Osteoarthritis of right hip 08/08/2014  . Adult BMI 30+ 08/08/2014  . SBO (spina bifida occulta) 08/08/2014    Gyneth Hubka PT, DPT 03/24/2019, 9:28 AM  [place] [address] [place], [place], [address] Phone: [phone]   Fax:  [phone]  Name: Shelby Henson MRN: [id number] Date of Birth: 09-05-1970

## 2019-03-25 ENCOUNTER — Encounter: Payer: No Typology Code available for payment source | Admitting: Physical Therapy

## 2019-03-26 ENCOUNTER — Ambulatory Visit: Payer: No Typology Code available for payment source | Admitting: Physical Therapy

## 2019-03-26 ENCOUNTER — Other Ambulatory Visit: Payer: Self-pay

## 2019-03-26 ENCOUNTER — Encounter: Payer: Self-pay | Admitting: Physical Therapy

## 2019-03-26 DIAGNOSIS — M6281 Muscle weakness (generalized): Secondary | ICD-10-CM

## 2019-03-26 DIAGNOSIS — M25551 Pain in right hip: Secondary | ICD-10-CM | POA: Diagnosis not present

## 2019-03-26 NOTE — Therapy (Signed)
[place] MAIN [facility] SERVICES [address] [place], [place], [address] Phone: [phone]   Fax:  [phone]  Physical Therapy Treatment  Patient Details  Name: Shelby Henson MRN: [id number] Date of Birth: 1970-04-18 Referring Provider (PT): Dr. Harlow Mares   Encounter Date: 03/26/2019  PT End of Session - 03/26/19 0758    Visit Number  28    Number of Visits  33    Date for PT Re-Evaluation  03/30/19    Authorization Type  last progress note 12/31/18    PT Start Time  0802    PT Stop Time  0845    PT Time Calculation (min)  43 min    Equipment Utilized During Treatment  Gait belt    Activity Tolerance  Patient tolerated treatment well;No increased pain    Behavior During Therapy  WFL for tasks assessed/performed       Past Medical History:  Diagnosis Date  . Anemia   . Asthma    well controlled  . Complication of anesthesia   . Depression   . GERD (gastroesophageal reflux disease)    occ  . Migraine   . Osteoarthritis   . PONV (postoperative nausea and vomiting)    with c-section x 1    Past Surgical History:  Procedure Laterality Date  . ABDOMINAL HYSTERECTOMY    . ABLATION    . BREAST CYST ASPIRATION Left   . CESAREAN SECTION    . CESAREAN SECTION    . CESAREAN SECTION    . LAPAROSCOPY  2018  . LEEP    . TOTAL HIP ARTHROPLASTY Right 11/12/2018   Procedure: TOTAL HIP ARTHROPLASTY ANTERIOR APPROACH;  Surgeon: Lovell Sheehan, MD;  Location: [facility] ORS;  Service: Orthopedics;  Laterality: Right;  . TOTAL VAGINAL HYSTERECTOMY      There were no vitals filed for this visit.  Subjective Assessment - 03/26/19 0803    Subjective  Pt reports increased right hip pain over the weekend especially at night. Reduced pain with movement; She reports trying ice and states that it is very tender to touch    Pertinent History  Pt presents s/p right anterior total hip arthroplasty on 11/12/18.  She has completed 2 sessions of PT at another  facility, however, her treatment sessions were not one-on-one, and she was not getting the feedback she was looking for on her exercises.    Currently in Pain?  Yes    Pain Score  5     Pain Location  Hip    Pain Orientation  Right;Anterior    Pain Descriptors / Indicators  Aching;Sore;Tender    Pain Type  Surgical pain    Pain Onset  More than a month ago    Pain Frequency  Intermittent    Aggravating Factors   worse at night    Pain Relieving Factors  stretch/movement    Effect of Pain on Daily Activities  pushes through the pain    Multiple Pain Sites  No               TREATMENT: Warm up oncrosstrainer, level 0 x5 min (unbilled)  Patient reports increased right hip pain; PT inspected hip, identified significant tightness and tenderness along right quadriceps, particularly lateral muscle group proximal to hip;   Patient prone: PT performed passive quad stretch with towel roll under knee for increased stretch 30 sec hold x2 reps RLE only; PT performed grade II-III PA mobs to right proximal femoral joint  with hip extension to facilitate increased joint mobility 15 sec bouts x3 sets;   Patient then transitioned to sidelying: PT performed passive RLE quad stretch 30 sec hold x3 reps; Progressed passive RLE quad stretch with hip adduction for IT band stretch 30 sec hold x2 reps;  Manual therapy: PT performed soft tissue massage to right quadriceps utilizing rolling stick along RLE thigh x10 min; Progressed to deep tissue massage with tennis ball, doing small circular motions x5 min; Patient reports moderate tenderness along right anterior/lateral thigh muscle. PT identified multiple trigger points, performed ischemic trigger point release x30 sec hold x1 rep for each trigger point;  PT utilized edge tool for IASTM to right proximal anterior hip soft tissue musculature x5 min performed cross friction massage to help reduce tightness; Patient reports moderate  tenderness  Following manual therapy she was able to exhibit improved tissue extensibility with less trigger points and less tightness noted, although she continued to have moderate tenderness  Finished session with cryotherapy to right hip x5 min in sidelying;  Patient tolerated session well. She reports continued soreness but states that her hip was not hurting as bad. Patient educated to reduce lunges/squats at this time to reduce muscle soreness in quadriceps. Also reinforced importance of RLE hip stretches. Patient verbalized understanding;                     PT Education - 03/26/19 0758    Education Details  exercise, HEP    Person(s) Educated  Patient    Methods  Explanation;Verbal cues    Comprehension  Verbalized understanding;Returned demonstration;Verbal cues required;Need further instruction       PT Short Term Goals - 03/02/19 1009      PT SHORT TERM GOAL #1   Title  Pt will be independent with HEP in order to improve strength and decrease back pain in order to improve pain-free function at home and work.    Baseline  12/2: HEP compliant    Time  4    Period  Weeks    Status  Achieved    Target Date  03/30/19        PT Long Term Goals - 03/02/19 1008      PT LONG TERM GOAL #1   Title  Pt will increase her LEFS to greater than 60/80 in order to demonstrate a clinically significant change in order to improve her ability to perform ADLs and IADLs with less difficulty and to improve her quality of life.    Baseline  11/26/18: 9/80 12/2: 25/80, 02/09/19: 22/80    Time  4    Period  Weeks    Status  Not Met    Target Date  03/30/19      PT LONG TERM GOAL #2   Title  Pt will improve her R hip ROM to within 10 degrees of her L hip ROM  in order to improve her ability to perform ADLs and IADLs with less difficulty and to improve her quality of life.    Baseline  11/26/18: 28 degrees of R hip IR, 12 degrees of R hip ER, 108 degrees of hip flexion, 20 deg  of hip abduction, and -2 deg of hip extension; 12/03/18: L hip assessed today for comparison: L hip IR 45 deg, L hip ER 40 deg, L hip flexion 124 deg, L hip abduction 40 deg, PROM form hip extension L 25 deg, R 8 deg 12/2: R hip : flexion 114, abductoin 31  degrees, extension 11 degrees, 02/09/19: see flowsheet    Time  4    Period  Weeks    Status  Achieved    Target Date  03/30/19      PT LONG TERM GOAL #3   Title  Pt will improve her R hip strength to a 4/5 in order to improve her ability to perform ADLs and IADLs with less difficulty and to improve her quality of life.    Baseline  11/26/18: 3+/5 hip flexion, 2+/5 hip ER (limited by pain and ROM), 3+/5 on hip IR, 3+/5 on hip abduction, 2+/5 on hip extension, 5/5 on knee extension, and 4/5 on knee flexion; 12/03/18: L hip strength assesessed today for comparison: 4+/5 Hip flexion, 5/5 Hip ER in sitting, 5/5 Hip IR in sitting, 4/5 Hip abduction (reports pain on R hip from sidelying position), 4/5 Hip extension, 5/5 Knee extension, 4/5 Knee flexion 12/2: R hip flexion, abduction, and adduction 4-/5 hip extension 3-/5, 02/09/19: see flowsheet    Time  4    Period  Weeks    Status  Partially Met    Target Date  03/30/19      PT LONG TERM GOAL #4   Title  Pt will decrease worst hip pain as reported on NPRS by at least 3 points in order to demonstrate clinically significant reduction in hip pain.    Baseline  11/23/18: 9/10 12/2: 5/10, 02/09/19: 6/10    Time  4    Period  Weeks    Status  Partially Met    Target Date  03/30/19            Plan - 03/26/19 0947    Clinical Impression Statement  Patient motivated and participated well within session. She exhibits significant tightness along right quadriceps muscle particularly along lateral fibers towards proximal hip. PT performed extensive passive stretches to help reduce tightness. Also performed extensive manual therapy to help reduce tightness including trigger point relief. Finished with ice.  Patient reports soreness at end of session but states that its less painful. Reinforced HEP with instruction to continue with ice/stretches to help reduce tightness.    Personal Factors and Comorbidities  Comorbidity 3+    Comorbidities  DDD, migraine, IBS,    Examination-Activity Limitations  Bend;Lift;Squat;Caring for Others;Locomotion Level;Stairs;Carry;Stand    Examination-Participation Restrictions  Interpersonal Relationship;Yard Work;Cleaning;Laundry;Community Activity;Shop;Driving;Meal Prep    Rehab Potential  Good    PT Frequency  2x / week    PT Duration  4 weeks    PT Treatment/Interventions  Cryotherapy;Electrical Stimulation;Iontophoresis 42m/ml Dexamethasone;Moist Heat;Ultrasound;DME Instruction;Gait training;Stair training;Functional mobility training;Therapeutic activities;Therapeutic exercise;Balance training;Neuromuscular re-education;Patient/family education;Manual techniques;Scar mobilization;Passive range of motion;Dry needling;Taping;Joint Manipulations;ADLs/Self Care Home Management;Aquatic Therapy;Canalith Repostioning;Vestibular;Spinal Manipulations    PT Next Visit Plan  progress strength and ROM per protocol    PT Home Exercise Plan  [email or web address]    Consulted and Agree with Plan of Care  Patient       Patient will benefit from skilled therapeutic intervention in order to improve the following deficits and impairments:  Decreased balance, Decreased endurance, Decreased range of motion, Pain, Decreased strength, Abnormal gait  Visit Diagnosis: Pain in right hip  Muscle weakness (generalized)     Problem List Patient Active Problem List   Diagnosis Date Noted  . Pain in right hip 01/12/2019  . [place] of right [place] 11/12/2018  . Vitamin D deficiency 02/25/2018  . Anemia, unspecified 01/08/2017  . Chronic pain 10/24/2015  . Hot flashes 10/24/2015  . Mood  changes 10/24/2015  . Migraine without aura and without status migrainosus, not intractable  10/24/2015  . Metabolic syndrome 85/92/9244  . Dyslipidemia 10/24/2015  . Allergic rhinitis, seasonal 05/17/2015  . Asthma, mild intermittent 08/08/2014  . Chronic insomnia 08/08/2014  . DDD (degenerative disc disease), lumbar 08/08/2014  . Mood disorder ([place]) 08/08/2014  . Gastro-esophageal reflux disease without esophagitis 08/08/2014  . H/O total hysterectomy 08/08/2014  . Irritable bowel syndrome with diarrhea 08/08/2014  . Osteoarthritis of right hip 08/08/2014  . Adult BMI 30+ 08/08/2014  . SBO (spina bifida occulta) 08/08/2014    Sylvio Weatherall PT, DPT 03/26/2019, 10:33 AM  [place] MAIN [facility] SERVICES [place], [place], [address] Phone: [phone]   Fax:  [phone]  Name: KATEE WENTLAND MRN: [id number] Date of Birth: 09-Feb-1970

## 2019-03-30 ENCOUNTER — Encounter: Payer: Self-pay | Admitting: Physical Therapy

## 2019-03-30 ENCOUNTER — Other Ambulatory Visit: Payer: Self-pay

## 2019-03-30 ENCOUNTER — Ambulatory Visit: Payer: No Typology Code available for payment source | Attending: Orthopedic Surgery | Admitting: Physical Therapy

## 2019-03-30 DIAGNOSIS — M25551 Pain in right hip: Secondary | ICD-10-CM | POA: Insufficient documentation

## 2019-03-30 DIAGNOSIS — M6281 Muscle weakness (generalized): Secondary | ICD-10-CM | POA: Diagnosis present

## 2019-03-30 NOTE — Therapy (Signed)
[place] MAIN [facility] SERVICES [address] [place], [place], [address] Phone: [phone]   Fax:  [phone]  Physical Therapy Treatment  Patient Details  Name: Shelby Henson MRN: [id number] Date of Birth: Aug 19, 1970 Referring Provider (PT): Dr. Harlow Mares   Encounter Date: 03/30/2019  PT End of Session - 03/30/19 1028    Visit Number  29    Number of Visits  41    Date for PT Re-Evaluation  04/27/19    Authorization Type  Goals last assessed 03/30/19    PT Start Time  1018    PT Stop Time  1100    PT Time Calculation (min)  42 min    Equipment Utilized During Treatment  Gait belt    Activity Tolerance  Patient tolerated treatment well;No increased pain    Behavior During Therapy  WFL for tasks assessed/performed       Past Medical History:  Diagnosis Date  . Anemia   . Asthma    well controlled  . Complication of anesthesia   . Depression   . GERD (gastroesophageal reflux disease)    occ  . Migraine   . Osteoarthritis   . PONV (postoperative nausea and vomiting)    with c-section x 1    Past Surgical History:  Procedure Laterality Date  . ABDOMINAL HYSTERECTOMY    . ABLATION    . BREAST CYST ASPIRATION Left   . CESAREAN SECTION    . CESAREAN SECTION    . CESAREAN SECTION    . LAPAROSCOPY  2018  . LEEP    . TOTAL HIP ARTHROPLASTY Right 11/12/2018   Procedure: TOTAL HIP ARTHROPLASTY ANTERIOR APPROACH;  Surgeon: Lovell Sheehan, MD;  Location: [facility] ORS;  Service: Orthopedics;  Laterality: Right;  . TOTAL VAGINAL HYSTERECTOMY      There were no vitals filed for this visit.  Subjective Assessment - 03/30/19 1027    Subjective  Patient reports continued RLE hip pain which is very tender to touch; Reports trying some of the stretches but has a hard time getting in proper position;    Pertinent History  Pt presents s/p right anterior total hip arthroplasty on 11/12/18.  She has completed 2 sessions of PT at another facility,  however, her treatment sessions were not one-on-one, and she was not getting the feedback she was looking for on her exercises.    Currently in Pain?  Yes    Pain Score  4     Pain Location  Hip    Pain Orientation  Right;Anterior    Pain Descriptors / Indicators  Aching;Sore;Tender    Pain Type  Surgical pain    Pain Radiating Towards  worse along right anterior/lateral hip    Pain Onset  More than a month ago    Pain Frequency  Intermittent    Aggravating Factors   worse at night    Pain Relieving Factors  stretch/movement    Effect of Pain on Daily Activities  pushes through the pain;    Multiple Pain Sites  No          TREATMENT: Warm up on treadmill 2.0 mph with 2 HHA x4 min (unbilled);   Standing left foot on 8 inch step forward shift with right anterior hip stretch 30 sec hold x2 reps;    Patient reports increased right hip pain; PT inspected hip, identified significant tightness and tenderness along right quadriceps, particularly lateral muscle group proximal to hip as well as  along right IT band;   Patient prone: 2# ankle weight:  Alternate knee bend x10 reps; Hip extension SLR x15 reps each LE Gluteal max hip extension (knee flexed) x10 reps x2 sets each LE; Patient required min-moderate verbal/tactile cues for correct exercise technique.  PT performed passive quad stretch with towel roll under knee for increased stretch 30 sec hold x2 reps RLE only; PT performed grade II-III PA mobs to right proximal femoral joint with hip extension to facilitate increased joint mobility 15 sec bouts x2 sets;    Patient then transitioned to sidelying: PT performed passive RLE quad stretch 30 sec hold x3 reps; Progressed passive RLE quad stretch with hip adduction for IT band stretch 30 sec hold x2 reps;   Manual therapy: PT performed ice massage along RLE lateral/anterior hip x8 min; Patient reports moderate tenderness along right anterior/lateral thigh muscle. Utilized ice for soft  tissue massage while also providing cryotherapy to help reduce pain/inflammation. Patient only able to tolerate light pressure due to pain/tendernes.    Following manual therapy pt reports that hips is numb but is still tender and sore (5-6/10) pain scale.    Patient tolerated session fair.  She reports continued soreness in RLE.  Patient educated to reduce lunges/squats at this time to reduce muscle soreness in quadriceps. Also reinforced importance of RLE hip stretches. Patient verbalized understanding;  [facility] PT Assessment - 03/30/19 0001      Observation/Other Assessments   Lower Extremity Functional Scale   23/80                        PT Education - 03/30/19 1028    Education Details  exercise/HEP, positioning;    Person(s) Educated  Patient    Methods  Explanation;Verbal cues    Comprehension  Verbalized understanding;Returned demonstration;Verbal cues required;Need further instruction       PT Short Term Goals - 03/30/19 1029      PT SHORT TERM GOAL #1   Title  Pt will be independent with HEP in order to improve strength and decrease back pain in order to improve pain-free function at home and work.    Baseline  12/2: HEP compliant    Time  4    Period  Weeks    Status  Achieved    Target Date  03/30/19        PT Long Term Goals - 03/30/19 1029      PT LONG TERM GOAL #1   Title  Pt will increase her LEFS to greater than 60/80 in order to demonstrate a clinically significant change in order to improve her ability to perform ADLs and IADLs with less difficulty and to improve her quality of life.    Baseline  11/26/18: 9/80 12/2: 25/80, 02/09/19: 22/80, 03/30/19: 23/80    Time  4    Period  Weeks    Status  Not Met    Target Date  04/27/19      PT LONG TERM GOAL #2   Title  Pt will improve her R hip ROM to within 10 degrees of her L hip ROM  in order to improve her ability to perform ADLs and IADLs with less difficulty and to improve her quality of life.     Baseline  11/26/18: 28 degrees of R hip IR, 12 degrees of R hip ER, 108 degrees of hip flexion, 20 deg of hip abduction, and -2 deg of hip extension; 12/03/18: L hip assessed  today for comparison: L hip IR 45 deg, L hip ER 40 deg, L hip flexion 124 deg, L hip abduction 40 deg, PROM form hip extension L 25 deg, R 8 deg 12/2: R hip : flexion 114, abductoin 31 degrees, extension 11 degrees, 02/09/19: see flowsheet    Time  4    Period  Weeks    Status  Achieved    Target Date  04/27/19      PT LONG TERM GOAL #3   Title  Pt will improve her R hip strength to a 4/5 in order to improve her ability to perform ADLs and IADLs with less difficulty and to improve her quality of life.    Baseline  11/26/18: 3+/5 hip flexion, 2+/5 hip ER (limited by pain and ROM), 3+/5 on hip IR, 3+/5 on hip abduction, 2+/5 on hip extension, 5/5 on knee extension, and 4/5 on knee flexion; 12/03/18: L hip strength assesessed today for comparison: 4+/5 Hip flexion, 5/5 Hip ER in sitting, 5/5 Hip IR in sitting, 4/5 Hip abduction (reports pain on R hip from sidelying position), 4/5 Hip extension, 5/5 Knee extension, 4/5 Knee flexion 12/2: R hip flexion, abduction, and adduction 4-/5 hip extension 3-/5, 02/09/19: see flowsheet    Time  4    Period  Weeks    Status  Partially Met    Target Date  04/27/19      PT LONG TERM GOAL #4   Title  Pt will decrease worst hip pain as reported on NPRS by at least 3 points in order to demonstrate clinically significant reduction in hip pain.    Baseline  11/23/18: 9/10 12/2: 5/10, 02/09/19: 6/10, 3/1: 5-6/10    Time  4    Period  Weeks    Status  Partially Met    Target Date  04/27/19            Plan - 03/30/19 1206    Clinical Impression Statement  Patient motivated and participated well within session. She continues to have increased RLE discomfort especially along lateral and anterior hip. She exhibits increased tenderness consistent with possible inflammation/muscle tightness.  Educated patient in advanced LE stretches to improve tissue extensibility. She does require min Vcs for proper positioning. Patient reports less pain following stretches and is gradually improving in ROM and mobility. She also reports being able to do more at home with less rest breaks. She continues to have weakness in RLE which limits overall mobility. She would benefit from additional skilled PT intervention to improve strength, balance and mobility while reducing hip pain;    Personal Factors and Comorbidities  Comorbidity 3+    Comorbidities  DDD, migraine, IBS,    Examination-Activity Limitations  Bend;Lift;Squat;Caring for Others;Locomotion Level;Stairs;Carry;Stand    Examination-Participation Restrictions  Interpersonal Relationship;Yard Work;Cleaning;Laundry;Community Activity;Shop;Driving;Meal Prep    Rehab Potential  Good    PT Frequency  2x / week    PT Duration  4 weeks    PT Treatment/Interventions  Cryotherapy;Electrical Stimulation;Iontophoresis 60m/ml Dexamethasone;Moist Heat;Ultrasound;DME Instruction;Gait training;Stair training;Functional mobility training;Therapeutic activities;Therapeutic exercise;Balance training;Neuromuscular re-education;Patient/family education;Manual techniques;Scar mobilization;Passive range of motion;Dry needling;Taping;Joint Manipulations;ADLs/Self Care Home Management;Aquatic Therapy;Canalith Repostioning;Vestibular;Spinal Manipulations    PT Next Visit Plan  progress strength and ROM per protocol    PT Home Exercise Plan  [email or web address]    Consulted and Agree with Plan of Care  Patient       Patient will benefit from skilled therapeutic intervention in order to improve the following deficits and impairments:  Decreased balance, Decreased  endurance, Decreased range of motion, Pain, Decreased strength, Abnormal gait  Visit Diagnosis: Pain in right hip  Muscle weakness (generalized)     Problem List Patient Active Problem List   Diagnosis Date Noted   . Pain in right hip 01/12/2019  . Osteonecrosis of right hip ([place]) 11/12/2018  . Vitamin D deficiency 02/25/2018  . Anemia, unspecified 01/08/2017  . Chronic pain 10/24/2015  . Hot flashes 10/24/2015  . Mood changes 10/24/2015  . Migraine without aura and without status migrainosus, not intractable 10/24/2015  . Metabolic syndrome 92/76/3943  . Dyslipidemia 10/24/2015  . Allergic rhinitis, seasonal 05/17/2015  . Asthma, mild intermittent 08/08/2014  . Chronic insomnia 08/08/2014  . DDD (degenerative disc disease), lumbar 08/08/2014  . Mood disorder ([place]) 08/08/2014  . Gastro-esophageal reflux disease without esophagitis 08/08/2014  . H/O total hysterectomy 08/08/2014  . Irritable bowel syndrome with diarrhea 08/08/2014  . Osteoarthritis of right hip 08/08/2014  . Adult BMI 30+ 08/08/2014  . SBO (spina bifida occulta) 08/08/2014    Navon Kotowski PT, DPT 03/30/2019, 12:15 PM  [place] MAIN [facility] SERVICES [address] [place], [place], [address] Phone: [phone]   Fax:  [phone]  Name: KINESHA AUTEN MRN: [id number] Date of Birth: 1970-03-26

## 2019-04-01 ENCOUNTER — Ambulatory Visit: Payer: No Typology Code available for payment source

## 2019-04-01 ENCOUNTER — Other Ambulatory Visit: Payer: Self-pay

## 2019-04-01 DIAGNOSIS — M25551 Pain in right hip: Secondary | ICD-10-CM | POA: Diagnosis not present

## 2019-04-01 DIAGNOSIS — M6281 Muscle weakness (generalized): Secondary | ICD-10-CM

## 2019-04-01 NOTE — Therapy (Signed)
[place] MAIN [facility] SERVICES [address] [place], [place], [address] Phone: [phone]   Fax:  [phone]  Physical Therapy Treatment  Patient Details  Name: Shelby Henson MRN: [id number] Date of Birth: 1970/08/07 Referring Provider (PT): Dr. Harlow Mares   Encounter Date: 04/01/2019  PT End of Session - 04/02/19 1247    Visit Number  30    Number of Visits  41    Date for PT Re-Evaluation  04/27/19    Authorization Type  Goals last assessed 03/30/19    PT Start Time  0935    PT Stop Time  1015    PT Time Calculation (min)  40 min    Equipment Utilized During Treatment  Gait belt    Activity Tolerance  Patient tolerated treatment well    Behavior During Therapy  [facility] for tasks assessed/performed       Past Medical History:  Diagnosis Date  . Anemia   . Asthma    well controlled  . Complication of anesthesia   . Depression   . GERD (gastroesophageal reflux disease)    occ  . Migraine   . Osteoarthritis   . PONV (postoperative nausea and vomiting)    with c-section x 1    Past Surgical History:  Procedure Laterality Date  . ABDOMINAL HYSTERECTOMY    . ABLATION    . BREAST CYST ASPIRATION Left   . CESAREAN SECTION    . CESAREAN SECTION    . CESAREAN SECTION    . LAPAROSCOPY  2018  . LEEP    . TOTAL HIP ARTHROPLASTY Right 11/12/2018   Procedure: TOTAL HIP ARTHROPLASTY ANTERIOR APPROACH;  Surgeon: Lovell Sheehan, MD;  Location: [facility] ORS;  Service: Orthopedics;  Laterality: Right;  . TOTAL VAGINAL HYSTERECTOMY      There were no vitals filed for this visit.  Subjective Assessment - 04/02/19 1246    Subjective  Patient reports continued RLE hip pain post-operatively. She is discouraged and frustrated that she continues to have pain.    Pertinent History  Pt presents s/p right anterior total hip arthroplasty on 11/12/18.  She has completed 2 sessions of PT at another facility, however, her treatment sessions were not one-on-one,  and she was not getting the feedback she was looking for on her exercises.    Currently in Pain?  Yes    Pain Score  4     Pain Location  Hip    Pain Orientation  Right;Lateral    Pain Descriptors / Indicators  Aching;Sore    Pain Type  Chronic pain    Pain Onset  More than a month ago         Ther-ex   Follow-up appt with MD: May 12, 2019 Pain: 0/10 Present, 0/10 Best, 6/10 Worst: Aggravating factors: laying on either side, wakes her up at night, Easing factors: ice, massage 24 hour pain behavior: worse in evening Pain quality: pain quality: aching, throbby, sore, burn, no sharp Radiating symptoms: Yes From R hip down to knee but recently it has been more localized in the hip Numbness/Tingling: No  MUSCULOSKELETAL: Bulk: Normal No trophic changes noted to lower extremities and no signs of gross swelling or bruising  Lumbar AROM: WFL, mild reproduction of R hip pain with L lumbar rotation but otherwise no pain in all directions.. Overpressure performed in all planes  Palpation Pt is exceedingly painful to palpation along proximal R thigh at anterior, lateral, and posterolateral hip. She is  tender to palpation especially on her TFL and down her IT band. No palpable trigger points in posterior hip however pt will not tolerate a lot of pressure. Of not pt is fairly tender to palpation to L anterior, lateral, and posterolateral L hip and thigh as well although to a lesser degree than R side.   Strength R/L 5/5 Hip flexion *4/4+ Hip external rotation *4/4+ Hip internal rotation 4/4 Hip extension  4/4 Hip abduction 4/4 Hip adduction 5/5 Knee extension 4+/4+ Knee flexion 5/5 Ankle Dorsiflexion *indicates pain  Pt is particularly painful with resisted R hip flexion, ER, and abduction; Pain noted with composite resisted R hip flexion with abduction in supine when testing TFL strength   PROM  Knee: WFL and painless  Hip: WFL throughout R/L Flexion:  painful/painless Abduction: painless/painless Adduction: painless/painless Internal Rotation: painful/painful External Rotation: painless/painless  Muscle Length Hamstring length: WFL, no reproduction of pain Quad length ([place]): Grossly WNL but testing limited due to soft tissue Hip Flexor length Marcello Moores): Negative shortening on the R side; IT band length Nicoletta Dress): Negative on the R side;  Proprioception and hot/cold testing deferred on this date  Reflexes Deferred  Motor Control: Step down/up assessment: step down positive for significant trendelenberg bilaterally but significant trunk shift and weakness noted when in single leg stance on the R; Single Leg Trendelenburg Test: Positive bilaterally for excessive lateral trunk shift bilaterally with some contralateral pelvic drop;   Pt issued isometric clams, long duration holds and advised to stop all concentric clams and sidelying straight leg abduction                        PT Short Term Goals - 04/02/19 1247      PT SHORT TERM GOAL #1   Title  Pt will be independent with HEP in order to improve strength and decrease back pain in order to improve pain-free function at home and work.    Baseline  12/2: HEP compliant    Time  4    Period  Weeks    Status  Achieved    Target Date  03/30/19        PT Long Term Goals - 04/02/19 1247      PT LONG TERM GOAL #1   Title  Pt will increase her LEFS to greater than 60/80 in order to demonstrate a clinically significant change in order to improve her ability to perform ADLs and IADLs with less difficulty and to improve her quality of life.    Baseline  11/26/18: 9/80 12/2: 25/80, 02/09/19: 22/80, 03/30/19: 23/80    Time  4    Period  Weeks    Status  Not Met    Target Date  04/27/19      PT LONG TERM GOAL #2   Title  Pt will improve her R hip ROM to within 10 degrees of her L hip ROM  in order to improve her ability to perform ADLs and IADLs with less difficulty  and to improve her quality of life.    Baseline  11/26/18: 28 degrees of R hip IR, 12 degrees of R hip ER, 108 degrees of hip flexion, 20 deg of hip abduction, and -2 deg of hip extension; 12/03/18: L hip assessed today for comparison: L hip IR 45 deg, L hip ER 40 deg, L hip flexion 124 deg, L hip abduction 40 deg, PROM form hip extension L 25 deg, R 8 deg 12/2: R hip :  flexion 114, abductoin 31 degrees, extension 11 degrees, 02/09/19: see flowsheet    Time  4    Period  Weeks    Status  Achieved    Target Date  04/27/19      PT LONG TERM GOAL #3   Title  Pt will improve her R hip strength to a 4/5 in order to improve her ability to perform ADLs and IADLs with less difficulty and to improve her quality of life.    Baseline  11/26/18: 3+/5 hip flexion, 2+/5 hip ER (limited by pain and ROM), 3+/5 on hip IR, 3+/5 on hip abduction, 2+/5 on hip extension, 5/5 on knee extension, and 4/5 on knee flexion; 12/03/18: L hip strength assesessed today for comparison: 4+/5 Hip flexion, 5/5 Hip ER in sitting, 5/5 Hip IR in sitting, 4/5 Hip abduction (reports pain on R hip from sidelying position), 4/5 Hip extension, 5/5 Knee extension, 4/5 Knee flexion 12/2: R hip flexion, abduction, and adduction 4-/5 hip extension 3-/5, 02/09/19: see flowsheet    Time  4    Period  Weeks    Status  Partially Met    Target Date  04/27/19      PT LONG TERM GOAL #4   Title  Pt will decrease worst hip pain as reported on NPRS by at least 3 points in order to demonstrate clinically significant reduction in hip pain.    Baseline  11/23/18: 9/10 12/2: 5/10, 02/09/19: 6/10, 3/1: 5-6/10    Time  4    Period  Weeks    Status  Partially Met    Target Date  04/27/19            Plan - 04/02/19 1247    Clinical Impression Statement  Reassessed R hip pain. Pt is exceedingly painful to palpation along proximal R thigh at anterior, lateral, and posterolateral hip. She is tender to palpation especially on her TFL and down her IT band. No  palpable trigger points in posterior hip however pt will not tolerate a lot of pressure. Of not pt is fairly tender to palpation to L anterior, lateral, and posterolateral L hip and thigh as well although to a lesser degree than R side. During strength testing she is particularly painful with resisted R hip flexion, ER, and abduction individually but especially with composite resisted R hip flexion with abduction in supine when testing TFL strength. R hip passive internal rotation is painful to lateral hip. She appears to have a lot of muscle irritation to her hip abductors (glut med), external rotations (glut max), and compensatory flexors such as TFL.  Pt issued long duration hold isometric clams and advised to stop all concentric clams and sidelying straight leg abduction. Pt encouraged to continue aggressive icing as well as sleeping in sidelying on the L side with a bulky pillow between her knees to prevent excessive IR/adduction of R hip.    Personal Factors and Comorbidities  Comorbidity 3+    Comorbidities  DDD, migraine, IBS,    Examination-Activity Limitations  Bend;Lift;Squat;Caring for Others;Locomotion Level;Stairs;Carry;Stand    Examination-Participation Restrictions  Interpersonal Relationship;Yard Work;Cleaning;Laundry;Community Activity;Shop;Driving;Meal Prep    Rehab Potential  Good    PT Frequency  2x / week    PT Duration  4 weeks    PT Treatment/Interventions  Cryotherapy;Electrical Stimulation;Iontophoresis 104m/ml Dexamethasone;Moist Heat;Ultrasound;DME Instruction;Gait training;Stair training;Functional mobility training;Therapeutic activities;Therapeutic exercise;Balance training;Neuromuscular re-education;Patient/family education;Manual techniques;Scar mobilization;Passive range of motion;Dry needling;Taping;Joint Manipulations;ADLs/Self Care Home Management;Aquatic Therapy;Canalith Repostioning;Vestibular;Spinal Manipulations    PT Next Visit Plan  progress strength and ROM per  protocol    PT Home Exercise Plan  [email or web address]    Consulted and Agree with Plan of Care  Patient       Patient will benefit from skilled therapeutic intervention in order to improve the following deficits and impairments:  Decreased balance, Decreased endurance, Decreased range of motion, Pain, Decreased strength, Abnormal gait  Visit Diagnosis: Pain in right hip  Muscle weakness (generalized)     Problem List Patient Active Problem List   Diagnosis Date Noted  . Pain in right hip 01/12/2019  . Osteonecrosis of right hip ([place]) 11/12/2018  . Vitamin D deficiency 02/25/2018  . Anemia, unspecified 01/08/2017  . Chronic pain 10/24/2015  . Hot flashes 10/24/2015  . Mood changes 10/24/2015  . Migraine without aura and without status migrainosus, not intractable 10/24/2015  . Metabolic syndrome 02/77/4128  . Dyslipidemia 10/24/2015  . Allergic rhinitis, seasonal 05/17/2015  . Asthma, mild intermittent 08/08/2014  . Chronic insomnia 08/08/2014  . DDD (degenerative disc disease), lumbar 08/08/2014  . Mood disorder ([place]) 08/08/2014  . Gastro-esophageal reflux disease without esophagitis 08/08/2014  . H/O total hysterectomy 08/08/2014  . Irritable bowel syndrome with diarrhea 08/08/2014  . Osteoarthritis of right hip 08/08/2014  . Adult BMI 30+ 08/08/2014  . SBO (spina bifida occulta) 08/08/2014   Phillips Grout PT, DPT, GCS  Yoshiaki Kreuser 04/02/2019, 12:58 PM  [place] MAIN [facility] SERVICES [address] [place], [place], [address] Phone: [phone]   Fax:  [phone]  Name: Shelby Henson MRN: [id number] Date of Birth: 1970-06-15

## 2019-04-06 ENCOUNTER — Encounter: Payer: Self-pay | Admitting: Physical Therapy

## 2019-04-06 ENCOUNTER — Ambulatory Visit: Payer: No Typology Code available for payment source | Admitting: Physical Therapy

## 2019-04-06 ENCOUNTER — Other Ambulatory Visit: Payer: Self-pay

## 2019-04-06 DIAGNOSIS — M25551 Pain in right hip: Secondary | ICD-10-CM

## 2019-04-06 DIAGNOSIS — M6281 Muscle weakness (generalized): Secondary | ICD-10-CM

## 2019-04-06 NOTE — Therapy (Signed)
[place] MAIN [facility] SERVICES [address] [place], [place], [address] Phone: [phone]   Fax:  [phone]  Physical Therapy Treatment  Patient Details  Name: Shelby Henson MRN: [id number] Date of Birth: Feb 14, 1970 Referring Provider (PT): Dr. Harlow Mares   Encounter Date: 04/06/2019  PT End of Session - 04/06/19 0852    Visit Number  31    Number of Visits  41    Date for PT Re-Evaluation  04/27/19    Authorization Type  Goals last assessed 03/30/19    PT Start Time  0847    PT Stop Time  0930    PT Time Calculation (min)  43 min    Equipment Utilized During Treatment  Gait belt    Activity Tolerance  Patient tolerated treatment well    Behavior During Therapy  [facility] for tasks assessed/performed       Past Medical History:  Diagnosis Date  . Anemia   . Asthma    well controlled  . Complication of anesthesia   . Depression   . GERD (gastroesophageal reflux disease)    occ  . Migraine   . Osteoarthritis   . PONV (postoperative nausea and vomiting)    with c-section x 1    Past Surgical History:  Procedure Laterality Date  . ABDOMINAL HYSTERECTOMY    . ABLATION    . BREAST CYST ASPIRATION Left   . CESAREAN SECTION    . CESAREAN SECTION    . CESAREAN SECTION    . LAPAROSCOPY  2018  . LEEP    . TOTAL HIP ARTHROPLASTY Right 11/12/2018   Procedure: TOTAL HIP ARTHROPLASTY ANTERIOR APPROACH;  Surgeon: Lovell Sheehan, MD;  Location: [facility] ORS;  Service: Orthopedics;  Laterality: Right;  . TOTAL VAGINAL HYSTERECTOMY      There were no vitals filed for this visit.  Subjective Assessment - 04/06/19 0850    Subjective  Patient reports continued RLE hip pain. She reports trying the thicker towel and says that it is helping some. She reports still waking up with pain but states that she is sleeping better.    Pertinent History  Pt presents s/p right anterior total hip arthroplasty on 11/12/18.  She has completed 2 sessions of PT at another  facility, however, her treatment sessions were not one-on-one, and she was not getting the feedback she was looking for on her exercises.    Currently in Pain?  Yes    Pain Score  5     Pain Location  Hip    Pain Orientation  Right;Lateral;Anterior    Pain Descriptors / Indicators  Aching;Sore;Tender    Pain Type  Chronic pain    Pain Onset  More than a month ago    Pain Frequency  Intermittent    Aggravating Factors   worse at night    Pain Relieving Factors  stretch/movement    Effect of Pain on Daily Activities  pushes through the pain;    Multiple Pain Sites  No           TREATMENT: Warm up on Crosstrainer, level 1 x3 min (unbilled):  Standing at steps: Hip flexor stretch with foot on 2nd step 30 sec hold x2 reps each LE;    Prone: PT performed passive quad stretch to RLE 30 sec hold x2 reps; PT instructed patient in isometric gluteal max hip extension 5 sec hold x10 reps;   Patient sidelying: PT instructed patient in isometric clamshells 5 sec  hold x15 reps with manual resistance; PT instructed patient in eccentric lower of RLE hip abduction x10 reps with min A for concentric lift;  Hooklying: RLE IT band stretch 30 sec hold x2 reps;  RLE piriformis stretch 30 sec hold x2 reps; RLE passive hip circles clockwise/counterclockwise x10 reps each direction Instructed patient in RLE hip flexor isometric 5 sec hold x15 reps;  RLE hip abduction/ER and adduction to midline x15 reps AROM; PT performed passive hamstring stretch with ankle DF/PF flossing x15 reps; Passive single knee to chest stretch RLE 20 sec hold x2 reps;   Finished with cryotherapy to right hip in supine x5 min (unbilled);   Response to treatment: Tolerated well. Patient reports significant reduction in pain with isometric strengthening and LE stretches. She did require min VCs for proper positioning and exercise technique for optimal muscle activation. She reports no pain at end of session;                   PT Education - 04/06/19 0852    Education Details  exercise/HEP    Person(s) Educated  Patient    Methods  Explanation;Verbal cues    Comprehension  Verbalized understanding;Returned demonstration;Verbal cues required;Need further instruction       PT Short Term Goals - 04/02/19 1247      PT SHORT TERM GOAL #1   Title  Pt will be independent with HEP in order to improve strength and decrease back pain in order to improve pain-free function at home and work.    Baseline  12/2: HEP compliant    Time  4    Period  Weeks    Status  Achieved    Target Date  03/30/19        PT Long Term Goals - 04/02/19 1247      PT LONG TERM GOAL #1   Title  Pt will increase her LEFS to greater than 60/80 in order to demonstrate a clinically significant change in order to improve her ability to perform ADLs and IADLs with less difficulty and to improve her quality of life.    Baseline  11/26/18: 9/80 12/2: 25/80, 02/09/19: 22/80, 03/30/19: 23/80    Time  4    Period  Weeks    Status  Not Met    Target Date  04/27/19      PT LONG TERM GOAL #2   Title  Pt will improve her R hip ROM to within 10 degrees of her L hip ROM  in order to improve her ability to perform ADLs and IADLs with less difficulty and to improve her quality of life.    Baseline  11/26/18: 28 degrees of R hip IR, 12 degrees of R hip ER, 108 degrees of hip flexion, 20 deg of hip abduction, and -2 deg of hip extension; 12/03/18: L hip assessed today for comparison: L hip IR 45 deg, L hip ER 40 deg, L hip flexion 124 deg, L hip abduction 40 deg, PROM form hip extension L 25 deg, R 8 deg 12/2: R hip : flexion 114, abductoin 31 degrees, extension 11 degrees, 02/09/19: see flowsheet    Time  4    Period  Weeks    Status  Achieved    Target Date  04/27/19      PT LONG TERM GOAL #3   Title  Pt will improve her R hip strength to a 4/5 in order to improve her ability to perform ADLs and IADLs with less  difficulty  and to improve her quality of life.    Baseline  11/26/18: 3+/5 hip flexion, 2+/5 hip ER (limited by pain and ROM), 3+/5 on hip IR, 3+/5 on hip abduction, 2+/5 on hip extension, 5/5 on knee extension, and 4/5 on knee flexion; 12/03/18: L hip strength assesessed today for comparison: 4+/5 Hip flexion, 5/5 Hip ER in sitting, 5/5 Hip IR in sitting, 4/5 Hip abduction (reports pain on R hip from sidelying position), 4/5 Hip extension, 5/5 Knee extension, 4/5 Knee flexion 12/2: R hip flexion, abduction, and adduction 4-/5 hip extension 3-/5, 02/09/19: see flowsheet    Time  4    Period  Weeks    Status  Partially Met    Target Date  04/27/19      PT LONG TERM GOAL #4   Title  Pt will decrease worst hip pain as reported on NPRS by at least 3 points in order to demonstrate clinically significant reduction in hip pain.    Baseline  11/23/18: 9/10 12/2: 5/10, 02/09/19: 6/10, 3/1: 5-6/10    Time  4    Period  Weeks    Status  Partially Met    Target Date  04/27/19            Plan - 04/06/19 0925    Clinical Impression Statement  Patient motivated and participated well within session. Instructed patient in isometric hip strengthening and eccentric strengthening exercise to help improve motor control without increasing inflammation/hip discomfort. Patient tolerated well. She does require min VCs for correct exercise technique/positioning for optimal muscle activation. Patient reports less discomfort at end of session. She would benefit from additional skilled PT intervention to improve strength and reduce pain with ADLs.    Personal Factors and Comorbidities  Comorbidity 3+    Comorbidities  DDD, migraine, IBS,    Examination-Activity Limitations  Bend;Lift;Squat;Caring for Others;Locomotion Level;Stairs;Carry;Stand    Examination-Participation Restrictions  Interpersonal Relationship;Yard Work;Cleaning;Laundry;Community Activity;Shop;Driving;Meal Prep    Rehab Potential  Good    PT Frequency  2x / week     PT Duration  4 weeks    PT Treatment/Interventions  Cryotherapy;Electrical Stimulation;Iontophoresis 87m/ml Dexamethasone;Moist Heat;Ultrasound;DME Instruction;Gait training;Stair training;Functional mobility training;Therapeutic activities;Therapeutic exercise;Balance training;Neuromuscular re-education;Patient/family education;Manual techniques;Scar mobilization;Passive range of motion;Dry needling;Taping;Joint Manipulations;ADLs/Self Care Home Management;Aquatic Therapy;Canalith Repostioning;Vestibular;Spinal Manipulations    PT Next Visit Plan  progress strength and ROM per protocol    PT Home Exercise Plan  [email or web address]    Consulted and Agree with Plan of Care  Patient       Patient will benefit from skilled therapeutic intervention in order to improve the following deficits and impairments:  Decreased balance, Decreased endurance, Decreased range of motion, Pain, Decreased strength, Abnormal gait  Visit Diagnosis: Pain in right hip  Muscle weakness (generalized)     Problem List Patient Active Problem List   Diagnosis Date Noted  . Pain in right hip 01/12/2019  . Oste[place]crosis of right [place] ([place] 11/12/2018  . Vitamin D deficiency 02/25/2018  . Anemia, unspecified 01/08/2017  . Chronic pain 10/24/2015  . Hot flashes 10/24/2015  . Mood changes 10/24/2015  . Migraine without aura and without status migrainosus, not intractable 10/24/2015  . Metabolic syndrome 013/24/4010 . Dyslipidemia 10/24/2015  . Allergic rhinitis, seasonal 05/17/2015  . Asthma, mild intermittent 08/08/2014  . Chronic insomnia 08/08/2014  . DDD (degenerative disc disease), lumbar 08/08/2014  . Mood disorder ([place] 08/08/2014  . Gastro-esophageal reflux disease without esophagitis 08/08/2014  . H/O total hysterectomy 08/08/2014  . Irritable bowel  syndrome with diarrhea 08/08/2014  . Osteoarthritis of right hip 08/08/2014  . Adult BMI 30+ 08/08/2014  . SBO (spina bifida occulta) 08/08/2014     Damek Ende PT, DPT 04/06/2019, 9:26 AM  [place] MAIN [facility] SERVICES [address] [place], [place], [address] Phone: [phone]   Fax:  [phone]  Name: Shelby Henson MRN: [id number] Date of Birth: [birth year]

## 2019-04-08 ENCOUNTER — Other Ambulatory Visit: Payer: Self-pay

## 2019-04-08 ENCOUNTER — Encounter: Payer: Self-pay | Admitting: Physical Therapy

## 2019-04-08 ENCOUNTER — Ambulatory Visit: Payer: No Typology Code available for payment source | Admitting: Physical Therapy

## 2019-04-08 DIAGNOSIS — M6281 Muscle weakness (generalized): Secondary | ICD-10-CM

## 2019-04-08 DIAGNOSIS — M25551 Pain in right hip: Secondary | ICD-10-CM

## 2019-04-08 NOTE — Therapy (Signed)
[place] MAIN [facility] SERVICES [address] [place], [place], [address] Phone: [phone]   Fax:  [phone]  Physical Therapy Treatment  Patient Details  Name: Shelby Henson MRN: [id number] Date of Birth: [birth year] Referring Provider (PT): Dr. Harlow Mares   Encounter Date: 04/08/2019  PT End of Session - 04/08/19 0851    Visit Number  32    Number of Visits  41    Date for PT Re-Evaluation  04/27/19    Authorization Type  Goals last assessed 03/30/19    PT Start Time  0845    PT Stop Time  0928    PT Time Calculation (min)  43 min    Equipment Utilized During Treatment  Gait belt    Activity Tolerance  Patient tolerated treatment well    Behavior During Therapy  [facility] for tasks assessed/performed       Past Medical History:  Diagnosis Date  . Anemia   . Asthma    well controlled  . Complication of anesthesia   . Depression   . GERD (gastroesophageal reflux disease)    occ  . Migraine   . Osteoarthritis   . PONV (postoperative nausea and vomiting)    with c-section x 1    Past Surgical History:  Procedure Laterality Date  . ABDOMINAL HYSTERECTOMY    . ABLATION    . BREAST CYST ASPIRATION Left   . CESAREAN SECTION    . CESAREAN SECTION    . CESAREAN SECTION    . LAPAROSCOPY  2018  . LEEP    . TOTAL HIP ARTHROPLASTY Right 11/12/2018   Procedure: TOTAL HIP ARTHROPLASTY ANTERIOR APPROACH;  Surgeon: Lovell Sheehan, MD;  Location: [facility] ORS;  Service: Orthopedics;  Laterality: Right;  . TOTAL VAGINAL HYSTERECTOMY      There were no vitals filed for this visit.  Subjective Assessment - 04/08/19 0848    Subjective  Patient reports continued RLE pain but states that its not as agressive.    Pertinent History  Pt presents s/p right anterior total hip arthroplasty on 11/12/18.  She has completed 2 sessions of PT at another facility, however, her treatment sessions were not one-on-one, and she was not getting the feedback she was  looking for on her exercises.    Currently in Pain?  Yes    Pain Score  5     Pain Location  Hip    Pain Orientation  Right;Lateral;Anterior    Pain Descriptors / Indicators  Aching;Tender    Pain Type  Chronic pain    Pain Onset  More than a month ago    Pain Frequency  Intermittent    Aggravating Factors   worse at night    Pain Relieving Factors  stretch/movement    Effect of Pain on Daily Activities  pushes through the pain;    Multiple Pain Sites  No            TREATMENT: Warm up on Crosstrainer, level 1 x3 min (unbilled):   Standing at steps: Hip flexor stretch with foot on 2nd step 30 sec hold x2 reps each LE;   Prone: PT performed passive quad stretch to RLE 30 sec hold x2 reps; PT performed grade II-III PA mobs to right femoral joint 20 sec bout x3 sets;    Hooklying: RLE IT band stretch 30 sec hold x2 reps;  RLE piriformis stretch 30 sec hold x2 reps; RLE passive hip circles clockwise/counterclockwise x10 reps each direction,  has pain and clicking with internal rotation (clockwise) Instructed patient in RLE hip flexor isometric 5 sec hold x5 reps with increased catching reported;   RLE hip abduction/ER and adduction to midline x10 reps AROM; PT performed passive hamstring stretch with ankle DF/PF flossing x15 reps; Passive single knee to chest stretch RLE 20 sec hold x2 reps;    PT performed long axis distraction RLE 20 sec hold, 10 sec rest x5 min;  PT performed lateral distraction to RLE femoral joint 20 sec hold, 10 sec rest x5 min; Patient reports feeling good stretch during distraction but then has continued catch/pain with movement following distraction;  Finished with cryotherapy to right hip in supine x5 min (unbilled);    Response to treatment: Tolerated fair. Patient reports increased catching in RLE hip which limited ROM/exercise throughout session. PT performed increased manual therapy to improve joint mobility; Patient reports feeling good stretch  during manual therapy but then has continued catch afterwards. Reinforced HEP with LE stretch/ROM;                   PT Education - 04/08/19 0851    Education Details  exercise/HEP    Person(s) Educated  Patient    Methods  Explanation;Verbal cues    Comprehension  Verbalized understanding;Returned demonstration;Verbal cues required;Need further instruction       PT Short Term Goals - 04/02/19 1247      PT SHORT TERM GOAL #1   Title  Pt will be independent with HEP in order to improve strength and decrease back pain in order to improve pain-free function at home and work.    Baseline  12/2: HEP compliant    Time  4    Period  Weeks    Status  Achieved    Target Date  03/30/19        PT Long Term Goals - 04/02/19 1247      PT LONG TERM GOAL #1   Title  Pt will increase her LEFS to greater than 60/80 in order to demonstrate a clinically significant change in order to improve her ability to perform ADLs and IADLs with less difficulty and to improve her quality of life.    Baseline  11/26/18: 9/80 12/2: 25/80, 02/09/19: 22/80, 03/30/19: 23/80    Time  4    Period  Weeks    Status  Not Met    Target Date  04/27/19      PT LONG TERM GOAL #2   Title  Pt will improve her R hip ROM to within 10 degrees of her L hip ROM  in order to improve her ability to perform ADLs and IADLs with less difficulty and to improve her quality of life.    Baseline  11/26/18: 28 degrees of R hip IR, 12 degrees of R hip ER, 108 degrees of hip flexion, 20 deg of hip abduction, and -2 deg of hip extension; 12/03/18: L hip assessed today for comparison: L hip IR 45 deg, L hip ER 40 deg, L hip flexion 124 deg, L hip abduction 40 deg, PROM form hip extension L 25 deg, R 8 deg 12/2: R hip : flexion 114, abductoin 31 degrees, extension 11 degrees, 02/09/19: see flowsheet    Time  4    Period  Weeks    Status  Achieved    Target Date  04/27/19      PT LONG TERM GOAL #3   Title  Pt will improve her R  hip  strength to a 4/5 in order to improve her ability to perform ADLs and IADLs with less difficulty and to improve her quality of life.    Baseline  11/26/18: 3+/5 hip flexion, 2+/5 hip ER (limited by pain and ROM), 3+/5 on hip IR, 3+/5 on hip abduction, 2+/5 on hip extension, 5/5 on knee extension, and 4/5 on knee flexion; 12/03/18: L hip strength assesessed today for comparison: 4+/5 Hip flexion, 5/5 Hip ER in sitting, 5/5 Hip IR in sitting, 4/5 Hip abduction (reports pain on R hip from sidelying position), 4/5 Hip extension, 5/5 Knee extension, 4/5 Knee flexion 12/2: R hip flexion, abduction, and adduction 4-/5 hip extension 3-/5, 02/09/19: see flowsheet    Time  4    Period  Weeks    Status  Partially Met    Target Date  04/27/19      PT LONG TERM GOAL #4   Title  Pt will decrease worst hip pain as reported on NPRS by at least 3 points in order to demonstrate clinically significant reduction in hip pain.    Baseline  11/23/18: 9/10 12/2: 5/10, 02/09/19: 6/10, 3/1: 5-6/10    Time  4    Period  Weeks    Status  Partially Met    Target Date  04/27/19            Plan - 04/08/19 0921    Clinical Impression Statement  Patient motivated, however presents to therapy with increased pain. She reports increased catching throughout session. PT performed long axis distraction and lateral hip mobs with minimal relief. Reinforced LE stretches. She denies any pain with hip abduction/ER but does have discomfort with hip IR. She required min VCs for proper positioning/exercise technique. She would benefit from additional skilled PT intervention to improve strength and mobility while reducing pain;    Personal Factors and Comorbidities  Comorbidity 3+    Comorbidities  DDD, migraine, IBS,    Examination-Activity Limitations  Bend;Lift;Squat;Caring for Others;Locomotion Level;Stairs;Carry;Stand    Examination-Participation Restrictions  Interpersonal Relationship;Yard Work;Cleaning;Laundry;Community  Activity;Shop;Driving;Meal Prep    Rehab Potential  Good    PT Frequency  2x / week    PT Duration  4 weeks    PT Treatment/Interventions  Cryotherapy;Electrical Stimulation;Iontophoresis 88m/ml Dexamethasone;Moist Heat;Ultrasound;DME Instruction;Gait training;Stair training;Functional mobility training;Therapeutic activities;Therapeutic exercise;Balance training;Neuromuscular re-education;Patient/family education;Manual techniques;Scar mobilization;Passive range of motion;Dry needling;Taping;Joint Manipulations;ADLs/Self Care Home Management;Aquatic Therapy;Canalith Repostioning;Vestibular;Spinal Manipulations    PT Next Visit Plan  progress strength and ROM per protocol    PT Home Exercise Plan  [email or web address]    Consulted and Agree with Plan of Care  Patient       Patient will benefit from skilled therapeutic intervention in order to improve the following deficits and impairments:  Decreased balance, Decreased endurance, Decreased range of motion, Pain, Decreased strength, Abnormal gait  Visit Diagnosis: Pain in right hip  Muscle weakness (generalized)     Problem List Patient Active Problem List   Diagnosis Date Noted  . Pain in right hip 01/12/2019  . Oste[place]crosis of right hip ([place] 11/12/2018  . Vitamin D deficiency 02/25/2018  . Anemia, unspecified 01/08/2017  . Chronic pain 10/24/2015  . Hot flashes 10/24/2015  . Mood changes 10/24/2015  . Migraine without aura and without status migrainosus, not intractable 10/24/2015  . Metabolic syndrome 022/63/3354 . Dyslipidemia 10/24/2015  . Allergic rhinitis, seasonal 05/17/2015  . Asthma, mild intermittent 08/08/2014  . Chronic insomnia 08/08/2014  . DDD (degenerative disc disease), lumbar 08/08/2014  . Mood disorder ([place] 08/08/2014  .  Gastro-esophageal reflux disease without esophagitis 08/08/2014  . H/O total hysterectomy 08/08/2014  . Irritable bowel syndrome with diarrhea 08/08/2014  . Osteoarthritis of right hip 08/08/2014   . Adult BMI 30+ 08/08/2014  . SBO (spina bifida occulta) 08/08/2014    Shelby Henson PT, DPT 04/08/2019, 9:30 AM  [place] MAIN [facility] SERVICES [address] [place], [place], [address] Phone: [phone]   Fax:  [phone]  Name: Shelby Henson MRN: [id number] Date of Birth: 1970/04/22

## 2019-04-08 NOTE — Patient Instructions (Signed)
Access Code: [email or web address]  URL: [email or web address]  Date: 04/08/2019  Prepared by: Blanche East   Exercises  Seated Hamstring Stretch - 3 reps - 3 sets - 30 sec hold - 1x daily - 7x weekly

## 2019-04-13 ENCOUNTER — Ambulatory Visit: Payer: No Typology Code available for payment source

## 2019-04-13 ENCOUNTER — Other Ambulatory Visit: Payer: Self-pay

## 2019-04-13 DIAGNOSIS — M6281 Muscle weakness (generalized): Secondary | ICD-10-CM

## 2019-04-13 DIAGNOSIS — M25551 Pain in right hip: Secondary | ICD-10-CM

## 2019-04-13 NOTE — Therapy (Signed)
[place] MAIN [facility] SERVICES [address] [place], [place], [address] Phone: [phone]   Fax:  [phone]  Physical Therapy Treatment  Patient Details  Name: Shelby Henson MRN: [id number] Date of Birth: 1970/05/13 Referring Provider (PT): Dr. Harlow Mares   Encounter Date: 04/13/2019  PT End of Session - 04/13/19 1519    Visit Number  33    Number of Visits  41    Date for PT Re-Evaluation  04/27/19    Authorization Type  Goals last assessed 03/30/19    PT Start Time  1517    PT Stop Time  1600    PT Time Calculation (min)  43 min    Equipment Utilized During Treatment  Gait belt    Activity Tolerance  Patient tolerated treatment well    Behavior During Therapy  [facility] for tasks assessed/performed       Past Medical History:  Diagnosis Date  . Anemia   . Asthma    well controlled  . Complication of anesthesia   . Depression   . GERD (gastroesophageal reflux disease)    occ  . Migraine   . Osteoarthritis   . PONV (postoperative nausea and vomiting)    with c-section x 1    Past Surgical History:  Procedure Laterality Date  . ABDOMINAL HYSTERECTOMY    . ABLATION    . BREAST CYST ASPIRATION Left   . CESAREAN SECTION    . CESAREAN SECTION    . CESAREAN SECTION    . LAPAROSCOPY  2018  . LEEP    . TOTAL HIP ARTHROPLASTY Right 11/12/2018   Procedure: TOTAL HIP ARTHROPLASTY ANTERIOR APPROACH;  Surgeon: Lovell Sheehan, MD;  Location: [facility] ORS;  Service: Orthopedics;  Laterality: Right;  . TOTAL VAGINAL HYSTERECTOMY      There were no vitals filed for this visit.  Subjective Assessment - 04/13/19 1518    Subjective  Patient reports continued RLE pain, worse over the weekend and slightly improved today. She would like to try dry needling today.    Pertinent History  Pt presents s/p right anterior total hip arthroplasty on 11/12/18.  She has completed 2 sessions of PT at another facility, however, her treatment sessions were not  one-on-one, and she was not getting the feedback she was looking for on her exercises.    Currently in Pain?  Yes    Pain Score  3     Pain Location  Hip    Pain Orientation  Right;Lateral    Pain Descriptors / Indicators  Aching;Tender    Pain Type  Chronic pain    Pain Onset  More than a month ago          TREATMENT   Manual Therapy  R single knee to chest stretch 30s x 2; R FABER stretch 30s x 2; R pirifromis stretch, performed with hip below 90 due to groin pain when attempted above 90, 30s x 2; R hip adductor stretch (moving into abduction) x 30s; R HS stretch 30s x 2; L sidelying R hip flexor stretch 30s x 2; Prone R quad stretch attempted but discontinued due to R knee pain; Prone R hip extension stretch 30s x 3; Prone R hip grade I, A/P mobilizations with hip at neutral 20s/bout x 4 bouts, pt reports that this "feels good" for her hip" Very light IASTM to R lateral and anterolateral hip as well as lateral thigh (over IT band), pt reports no irritation of her  pain;   Trigger Point Dry Needling (TDN) Education performed with patient regarding potential benefit of TDN. Reviewed precautions and risks with patient. Extensive time spent with pt to ensure full understanding of TDN risks. Pt provided verbal consent to treatment. TDN performed to R TFL  With 1, 0.30 x 50 single needle placement with "burning" reported by patient. Also performed to R superior and anterior fibers of glut max with 2, 0.30 x 50 single needle placements with ache reported by patient. Gentle pistoning technique utilized as well as longer duration placement to improve patient tolerance.   Utilized dry needling with patient today without any increase in her R hip pain. Therapist also focused session on gentle R hip stretching/PROM per guidance of primary therapist. She reports relief of R hip pain with a variety of stretches but most notably RLE cross body (adduction/IR) stretch for piriformis/IT band. Pt  also reports significant relief from prone grade I, R hip AP mobilizations. Pt encouraged to continue with light stretching/strengthening as prescribed by primary treating PT. Pt will benefit from PT services to address deficits in strength and pain in order to improve function at home and work.                        PT Short Term Goals - 04/02/19 1247      PT SHORT TERM GOAL #1   Title  Pt will be independent with HEP in order to improve strength and decrease back pain in order to improve pain-free function at home and work.    Baseline  12/2: HEP compliant    Time  4    Period  Weeks    Status  Achieved    Target Date  03/30/19        PT Long Term Goals - 04/02/19 1247      PT LONG TERM GOAL #1   Title  Pt will increase her LEFS to greater than 60/80 in order to demonstrate a clinically significant change in order to improve her ability to perform ADLs and IADLs with less difficulty and to improve her quality of life.    Baseline  11/26/18: 9/80 12/2: 25/80, 02/09/19: 22/80, 03/30/19: 23/80    Time  4    Period  Weeks    Status  Not Met    Target Date  04/27/19      PT LONG TERM GOAL #2   Title  Pt will improve her R hip ROM to within 10 degrees of her L hip ROM  in order to improve her ability to perform ADLs and IADLs with less difficulty and to improve her quality of life.    Baseline  11/26/18: 28 degrees of R hip IR, 12 degrees of R hip ER, 108 degrees of hip flexion, 20 deg of hip abduction, and -2 deg of hip extension; 12/03/18: L hip assessed today for comparison: L hip IR 45 deg, L hip ER 40 deg, L hip flexion 124 deg, L hip abduction 40 deg, PROM form hip extension L 25 deg, R 8 deg 12/2: R hip : flexion 114, abductoin 31 degrees, extension 11 degrees, 02/09/19: see flowsheet    Time  4    Period  Weeks    Status  Achieved    Target Date  04/27/19      PT LONG TERM GOAL #3   Title  Pt will improve her R hip strength to a 4/5 in order to improve her  ability to perform ADLs and IADLs with less difficulty and to improve her quality of life.    Baseline  11/26/18: 3+/5 hip flexion, 2+/5 hip ER (limited by pain and ROM), 3+/5 on hip IR, 3+/5 on hip abduction, 2+/5 on hip extension, 5/5 on knee extension, and 4/5 on knee flexion; 12/03/18: L hip strength assesessed today for comparison: 4+/5 Hip flexion, 5/5 Hip ER in sitting, 5/5 Hip IR in sitting, 4/5 Hip abduction (reports pain on R hip from sidelying position), 4/5 Hip extension, 5/5 Knee extension, 4/5 Knee flexion 12/2: R hip flexion, abduction, and adduction 4-/5 hip extension 3-/5, 02/09/19: see flowsheet    Time  4    Period  Weeks    Status  Partially Met    Target Date  04/27/19      PT LONG TERM GOAL #4   Title  Pt will decrease worst hip pain as reported on NPRS by at least 3 points in order to demonstrate clinically significant reduction in hip pain.    Baseline  11/23/18: 9/10 12/2: 5/10, 02/09/19: 6/10, 3/1: 5-6/10    Time  4    Period  Weeks    Status  Partially Met    Target Date  04/27/19            Plan - 04/13/19 1721    Clinical Impression Statement  Utilized dry needling with patient today without any increase in her R hip pain. Therapist also focused session on gentle R hip stretching/PROM per guidance of primary therapist. She reports relief of R hip pain with a variety of stretches but most notably RLE cross body (adduction/IR) stretch for piriformis/IT band. Pt also reports significant relief from prone grade I, R hip AP mobilizations. Pt encouraged to continue with light stretching/strengthening as prescribed by primary treating PT. Pt will benefit from PT services to address deficits in strength and pain in order to improve function at home and work.    Personal Factors and Comorbidities  Comorbidity 3+    Comorbidities  DDD, migraine, IBS,    Examination-Activity Limitations  Bend;Lift;Squat;Caring for Others;Locomotion Level;Stairs;Carry;Stand     Examination-Participation Restrictions  Interpersonal Relationship;Yard Work;Cleaning;Laundry;Community Activity;Shop;Driving;Meal Prep    Rehab Potential  Good    PT Frequency  2x / week    PT Duration  4 weeks    PT Treatment/Interventions  Cryotherapy;Electrical Stimulation;Iontophoresis 8m/ml Dexamethasone;Moist Heat;Ultrasound;DME Instruction;Gait training;Stair training;Functional mobility training;Therapeutic activities;Therapeutic exercise;Balance training;Neuromuscular re-education;Patient/family education;Manual techniques;Scar mobilization;Passive range of motion;Dry needling;Taping;Joint Manipulations;ADLs/Self Care Home Management;Aquatic Therapy;Canalith Repostioning;Vestibular;Spinal Manipulations    PT Next Visit Plan  progress strength and ROM per protocol    PT Home Exercise Plan  [email or web address]    Consulted and Agree with Plan of Care  Patient       Patient will benefit from skilled therapeutic intervention in order to improve the following deficits and impairments:  Decreased balance, Decreased endurance, Decreased range of motion, Pain, Decreased strength, Abnormal gait  Visit Diagnosis: Pain in right hip  Muscle weakness (generalized)     Problem List Patient Active Problem List   Diagnosis Date Noted  . Pain in right hip 01/12/2019  . [place] 11/12/[place]  . Vitamin D deficiency 02/25/2018  . Anemia, unspecified 01/08/2017  . Chronic pain 10/24/2015  . Hot flashes 10/24/2015  . Mood changes 10/24/2015  . Migraine without aura and without status migrainosus, not intractable 10/24/2015  . Metabolic syndrome 032/67/1245 . Dyslipidemia 10/24/2015  . Allergic rhinitis, seasonal 05/17/2015  . Asthma,  mild intermittent 08/08/2014  . Chronic insomnia 08/08/2014  . DDD (degenerative disc disease), lumbar 08/08/2014  . Mood disorder ([place]) 08/08/2014  . Gastro-esophageal reflux disease without esophagitis 08/08/2014  . H/O total hysterectomy  08/08/2014  . Irritable bowel syndrome with diarrhea 08/08/2014  . Osteoarthritis of right hip 08/08/2014  . Adult BMI 30+ 08/08/2014  . SBO (spina bifida occulta) 08/08/2014   Phillips Grout PT, DPT, GCS  Shelby Henson 04/13/2019, 5:30 PM  [place] MAIN [facility] SERVICES [address] [place], [place], [address] Phone: [phone]   Fax:  [phone]  Name: Shelby Henson MRN: [id number] Date of Birth: 1970-08-24

## 2019-04-15 ENCOUNTER — Ambulatory Visit: Payer: No Typology Code available for payment source | Admitting: Physical Therapy

## 2019-04-15 ENCOUNTER — Other Ambulatory Visit: Payer: Self-pay

## 2019-04-15 ENCOUNTER — Encounter: Payer: Self-pay | Admitting: Physical Therapy

## 2019-04-15 DIAGNOSIS — M25551 Pain in right hip: Secondary | ICD-10-CM

## 2019-04-15 DIAGNOSIS — M6281 Muscle weakness (generalized): Secondary | ICD-10-CM

## 2019-04-15 NOTE — Therapy (Signed)
[place] MAIN [facility] SERVICES [address] [place], [place], [address] Phone: [phone]   Fax:  [phone]  Physical Therapy Treatment  Patient Details  Name: Shelby Henson MRN: [id number] Date of Birth: [birth year] Referring Provider (PT): Dr. Harlow Mares   Encounter Date: 04/15/2019  PT End of Session - 04/15/19 0846    Visit Number  34    Number of Visits  41    Date for PT Re-Evaluation  04/27/19    Authorization Type  Goals last assessed 03/30/19    PT Start Time  0847    PT Stop Time  0930    PT Time Calculation (min)  43 min    Equipment Utilized During Treatment  Gait belt    Activity Tolerance  Patient tolerated treatment well    Behavior During Therapy  [facility] for tasks assessed/performed       Past Medical History:  Diagnosis Date  . Anemia   . Asthma    well controlled  . Complication of anesthesia   . Depression   . GERD (gastroesophageal reflux disease)    occ  . Migraine   . Osteoarthritis   . PONV (postoperative nausea and vomiting)    with c-section x 1    Past Surgical History:  Procedure Laterality Date  . ABDOMINAL HYSTERECTOMY    . ABLATION    . BREAST CYST ASPIRATION Left   . CESAREAN SECTION    . CESAREAN SECTION    . CESAREAN SECTION    . LAPAROSCOPY  2018  . LEEP    . TOTAL HIP ARTHROPLASTY Right 11/12/2018   Procedure: TOTAL HIP ARTHROPLASTY ANTERIOR APPROACH;  Surgeon: Lovell Sheehan, MD;  Location: [facility] ORS;  Service: Orthopedics;  Laterality: Right;  . TOTAL VAGINAL HYSTERECTOMY      There were no vitals filed for this visit.  Subjective Assessment - 04/15/19 0849    Subjective  Patient reports continued RLE hip pain. She thinks some of the soreness is related to the weather. No change in pain in relation to dry needling.    Pertinent History  Pt presents s/p right anterior total hip arthroplasty on 11/12/18.  She has completed 2 sessions of PT at another facility, however, her treatment sessions  were not one-on-one, and she was not getting the feedback she was looking for on her exercises.    Currently in Pain?  Yes    Pain Score  4     Pain Location  Hip    Pain Orientation  Right;Lateral    Pain Descriptors / Indicators  Aching;Tender    Pain Type  Chronic pain    Pain Onset  More than a month ago    Pain Frequency  Intermittent    Aggravating Factors   worse at night    Pain Relieving Factors  stretch/movement    Effect of Pain on Daily Activities  pushes through the pain;    Multiple Pain Sites  No         TREATMENT: Warm up on Crosstrainer, level 2 x3 min (unbilled):   Standing at steps: Hip flexor stretch with foot on 2nd step 30 sec hold x2 reps each LE;  Hamstring stretch with ankle DF 30 sec hold x2 reps bilaterally, required min VCs for proper positioning;   Prone: PT performed passive quad stretch to RLE 30 sec hold x2 reps; PT instructed patient in isometric gluteal max hip extension 5 sec hold x10 reps;  Alternate hip  extension x15 reps bilaterally; PT performed grade I PA mobs to right proximal femoral joint 20 sec bouts x2 sets with tenderness reported in posterior hip;    Patient sidelying: PT instructed patient in isometric clamshells 5 sec hold x10 reps with manual resistance; PT performed passive quad stretch 20 sec hold x2 reps; PT performed passive IT band stretch 20 sec hold x2 reps;    Hooklying: RLE IT band stretch 30 sec hold x2 reps;  RLE passive hip circles clockwise/counterclockwise x10 reps each direction Instructed patient in RLE hip flexor isometric 5 sec hold x15 reps;  Passive single knee to chest stretch RLE 20 sec hold x2 reps;  Instructed patient in isometric hip flexor activation in hooklying position 5 sec hold x10 reps;  Finished with cryotherapy to right hip in supine x5 min (unbilled);    Response to treatment: Tolerated well. Patient reports significant reduction in pain with isometric strengthening and LE stretches. She  did require min VCs for proper positioning and exercise technique for optimal muscle activation. She reports no pain at end of session;                          PT Education - 04/15/19 0846    Education Details  exercise, positioning, HEP    Person(s) Educated  Patient    Methods  Explanation;Verbal cues    Comprehension  Verbalized understanding;Returned demonstration;Verbal cues required;Need further instruction       PT Short Term Goals - 04/02/19 1247      PT SHORT TERM GOAL #1   Title  Pt will be independent with HEP in order to improve strength and decrease back pain in order to improve pain-free function at home and work.    Baseline  12/2: HEP compliant    Time  4    Period  Weeks    Status  Achieved    Target Date  03/30/19        PT Long Term Goals - 04/02/19 1247      PT LONG TERM GOAL #1   Title  Pt will increase her LEFS to greater than 60/80 in order to demonstrate a clinically significant change in order to improve her ability to perform ADLs and IADLs with less difficulty and to improve her quality of life.    Baseline  11/26/18: 9/80 12/2: 25/80, 02/09/19: 22/80, 03/30/19: 23/80    Time  4    Period  Weeks    Status  Not Met    Target Date  04/27/19      PT LONG TERM GOAL #2   Title  Pt will improve her R hip ROM to within 10 degrees of her L hip ROM  in order to improve her ability to perform ADLs and IADLs with less difficulty and to improve her quality of life.    Baseline  11/26/18: 28 degrees of R hip IR, 12 degrees of R hip ER, 108 degrees of hip flexion, 20 deg of hip abduction, and -2 deg of hip extension; 12/03/18: L hip assessed today for comparison: L hip IR 45 deg, L hip ER 40 deg, L hip flexion 124 deg, L hip abduction 40 deg, PROM form hip extension L 25 deg, R 8 deg 12/2: R hip : flexion 114, abductoin 31 degrees, extension 11 degrees, 02/09/19: see flowsheet    Time  4    Period  Weeks    Status  Achieved  Target Date   04/27/19      PT LONG TERM GOAL #3   Title  Pt will improve her R hip strength to a 4/5 in order to improve her ability to perform ADLs and IADLs with less difficulty and to improve her quality of life.    Baseline  11/26/18: 3+/5 hip flexion, 2+/5 hip ER (limited by pain and ROM), 3+/5 on hip IR, 3+/5 on hip abduction, 2+/5 on hip extension, 5/5 on knee extension, and 4/5 on knee flexion; 12/03/18: L hip strength assesessed today for comparison: 4+/5 Hip flexion, 5/5 Hip ER in sitting, 5/5 Hip IR in sitting, 4/5 Hip abduction (reports pain on R hip from sidelying position), 4/5 Hip extension, 5/5 Knee extension, 4/5 Knee flexion 12/2: R hip flexion, abduction, and adduction 4-/5 hip extension 3-/5, 02/09/19: see flowsheet    Time  4    Period  Weeks    Status  Partially Met    Target Date  04/27/19      PT LONG TERM GOAL #4   Title  Pt will decrease worst hip pain as reported on NPRS by at least 3 points in order to demonstrate clinically significant reduction in hip pain.    Baseline  11/23/18: 9/10 12/2: 5/10, 02/09/19: 6/10, 3/1: 5-6/10    Time  4    Period  Weeks    Status  Partially Met    Target Date  04/27/19            Plan - 04/15/19 0927    Clinical Impression Statement  Patient motivated and participated well within session. She reports less catching this session tolerating ROM/isometric exercises better. Patient requires min VCs for proper positioning and exercise technique for optimal muscle activation. She would benefit from additional skilled PT intervention to improve strength and mobility while reducing pain;    Personal Factors and Comorbidities  Comorbidity 3+    Comorbidities  DDD, migraine, IBS,    Examination-Activity Limitations  Bend;Lift;Squat;Caring for Others;Locomotion Level;Stairs;Carry;Stand    Examination-Participation Restrictions  Interpersonal Relationship;Yard Work;Cleaning;Laundry;Community Activity;Shop;Driving;Meal Prep    Rehab Potential  Good     PT Frequency  2x / week    PT Duration  4 weeks    PT Treatment/Interventions  Cryotherapy;Electrical Stimulation;Iontophoresis 4m/ml Dexamethasone;Moist Heat;Ultrasound;DME Instruction;Gait training;Stair training;Functional mobility training;Therapeutic activities;Therapeutic exercise;Balance training;Neuromuscular re-education;Patient/family education;Manual techniques;Scar mobilization;Passive range of motion;Dry needling;Taping;Joint Manipulations;ADLs/Self Care Home Management;Aquatic Therapy;Canalith Repostioning;Vestibular;Spinal Manipulations    PT Next Visit Plan  progress strength and ROM per protocol    PT Home Exercise Plan  [email or web address]    Consulted and Agree with Plan of Care  Patient       Patient will benefit from skilled therapeutic intervention in order to improve the following deficits and impairments:  Decreased balance, Decreased endurance, Decreased range of motion, Pain, Decreased strength, Abnormal gait  Visit Diagnosis: Pain in right hip  Muscle weakness (generalized)     Problem List Patient Active Problem List   Diagnosis Date Noted  . Pain in right hip 01/12/2019  . [place] of right [place] ([place] 11/12/2018  . Vitamin D deficiency 02/25/2018  . Anemia, unspecified 01/08/2017  . Chronic pain 10/24/2015  . Hot flashes 10/24/2015  . Mood changes 10/24/2015  . Migraine without aura and without status migrainosus, not intractable 10/24/2015  . Metabolic syndrome 062/37/6283 . Dyslipidemia 10/24/2015  . Allergic rhinitis, seasonal 05/17/2015  . Asthma, mild intermittent 08/08/2014  . Chronic insomnia 08/08/2014  . DDD (degenerative disc disease), lumbar 08/08/2014  .  Mood disorder ([place]) 08/08/2014  . Gastro-esophageal reflux disease without esophagitis 08/08/2014  . H/O total hysterectomy 08/08/2014  . Irritable bowel syndrome with diarrhea 08/08/2014  . Osteoarthritis of right hip 08/08/2014  . Adult BMI 30+ 08/08/2014  . SBO (spina bifida occulta)  08/08/2014    Luken Shadowens PT, DPT 04/15/2019, 9:28 AM  [place] [address] [place], [place], [address] Phone: [phone]   Fax:  [phone]  Name: AVRIELLE FRY MRN: [id number] Date of Birth: 08-25-1970

## 2019-04-21 ENCOUNTER — Ambulatory Visit: Payer: No Typology Code available for payment source | Admitting: Physical Therapy

## 2019-04-21 ENCOUNTER — Other Ambulatory Visit: Payer: Self-pay

## 2019-04-21 DIAGNOSIS — M6281 Muscle weakness (generalized): Secondary | ICD-10-CM

## 2019-04-21 DIAGNOSIS — M25551 Pain in right hip: Secondary | ICD-10-CM

## 2019-04-21 NOTE — Therapy (Signed)
[place] MAIN [facility] SERVICES [address] [place], [place], [address] Phone: [phone]   Fax:  [phone]  Physical Therapy Treatment  Patient Details  Name: Shelby Henson MRN: [id number] Date of Birth: 10/16/1970 Referring Provider (PT): Dr. Harlow Mares   Encounter Date: 04/21/2019  PT End of Session - 04/22/19 0805    Visit Number  35    Number of Visits  41    Date for PT Re-Evaluation  04/27/19    Authorization Type  Goals last assessed 03/30/19    PT Start Time  1146    PT Stop Time  1232    PT Time Calculation (min)  46 min    Equipment Utilized During Treatment  Gait belt    Activity Tolerance  Patient tolerated treatment well    Behavior During Therapy  [facility] for tasks assessed/performed       Past Medical History:  Diagnosis Date  . Anemia   . Asthma    well controlled  . Complication of anesthesia   . Depression   . GERD (gastroesophageal reflux disease)    occ  . Migraine   . Osteoarthritis   . PONV (postoperative nausea and vomiting)    with c-section x 1    Past Surgical History:  Procedure Laterality Date  . ABDOMINAL HYSTERECTOMY    . ABLATION    . BREAST CYST ASPIRATION Left   . CESAREAN SECTION    . CESAREAN SECTION    . CESAREAN SECTION    . LAPAROSCOPY  2018  . LEEP    . TOTAL HIP ARTHROPLASTY Right 11/12/2018   Procedure: TOTAL HIP ARTHROPLASTY ANTERIOR APPROACH;  Surgeon: Lovell Sheehan, MD;  Location: [facility] ORS;  Service: Orthopedics;  Laterality: Right;  . TOTAL VAGINAL HYSTERECTOMY      There were no vitals filed for this visit.  Subjective Assessment - 04/21/19 1152    Subjective  Patient reports continued RLE hip pain. She reports sleeping is getting a little better, she only feels the pain when she turns over.    Pertinent History  Pt presents s/p right anterior total hip arthroplasty on 11/12/18.  She has completed 2 sessions of PT at another facility, however, her treatment sessions were not  one-on-one, and she was not getting the feedback she was looking for on her exercises.    Currently in Pain?  Yes    Pain Score  5     Pain Location  Hip    Pain Orientation  Right;Anterior    Pain Descriptors / Indicators  Aching;Tender    Pain Type  Chronic pain    Pain Onset  More than a month ago    Pain Frequency  Intermittent    Aggravating Factors   worse at night    Pain Relieving Factors  stretch/movement    Effect of Pain on Daily Activities  pushes through the pain    Multiple Pain Sites  No            TREATMENT: Warm up on Crosstrainer, level 2 x3 min (unbilled):  PT applied interferential TENs to RLE anterior thigh at tolerated intensity #28, modulated session concurrent with all exercise for pain control;  Prone: PT performed passive quad stretch to RLE 30 sec hold x2 reps; 2# on BLE: -alternate knee flexion x15 reps bilaterally; -alternate hip extension SLR x15 reps bilaterally; PT instructed patient in isometric gluteal max hip extension 5 sec hold x10 reps;   Patient sidelying:  PT instructed patient in isometric clamshells 5 sec hold x10 reps with manual resistance; PT performed passive quad stretch 20 sec hold x2 reps; PT performed passive IT band stretch 20 sec hold x2 reps;   Hooklying: RLE IT band stretch 30 sec hold x2 reps;  RLE passive hip circles clockwise/counterclockwise x10 reps each direction Passive single knee to chest stretch RLE 20 sec hold x2 reps; Instructed patient in heel slides with increased pain reported; Therefore patient performed PROM heel slides x10 reps with no pain;  Finished with cryotherapy to right hip in supine x5 min (unbilled);  Response to treatment: Tolerated fair. Patient reports significant reduction in pain with isometric strengthening, LE stretches and TENs. She did require min VCs for proper positioning and exercise technique for optimal muscle activation.She reports mild soreness at end of session.                      PT Education - 04/22/19 0805    Education Details  exercise technique/TENs    Person(s) Educated  Patient    Methods  Explanation;Verbal cues    Comprehension  Verbalized understanding;Returned demonstration;Verbal cues required;Need further instruction       PT Short Term Goals - 04/02/19 1247      PT SHORT TERM GOAL #1   Title  Pt will be independent with HEP in order to improve strength and decrease back pain in order to improve pain-free function at home and work.    Baseline  12/2: HEP compliant    Time  4    Period  Weeks    Status  Achieved    Target Date  03/30/19        PT Long Term Goals - 04/02/19 1247      PT LONG TERM GOAL #1   Title  Pt will increase her LEFS to greater than 60/80 in order to demonstrate a clinically significant change in order to improve her ability to perform ADLs and IADLs with less difficulty and to improve her quality of life.    Baseline  11/26/18: 9/80 12/2: 25/80, 02/09/19: 22/80, 03/30/19: 23/80    Time  4    Period  Weeks    Status  Not Met    Target Date  04/27/19      PT LONG TERM GOAL #2   Title  Pt will improve her R hip ROM to within 10 degrees of her L hip ROM  in order to improve her ability to perform ADLs and IADLs with less difficulty and to improve her quality of life.    Baseline  11/26/18: 28 degrees of R hip IR, 12 degrees of R hip ER, 108 degrees of hip flexion, 20 deg of hip abduction, and -2 deg of hip extension; 12/03/18: L hip assessed today for comparison: L hip IR 45 deg, L hip ER 40 deg, L hip flexion 124 deg, L hip abduction 40 deg, PROM form hip extension L 25 deg, R 8 deg 12/2: R hip : flexion 114, abductoin 31 degrees, extension 11 degrees, 02/09/19: see flowsheet    Time  4    Period  Weeks    Status  Achieved    Target Date  04/27/19      PT LONG TERM GOAL #3   Title  Pt will improve her R hip strength to a 4/5 in order to improve her ability to perform ADLs and IADLs with  less difficulty and to improve her quality of  life.    Baseline  11/26/18: 3+/5 hip flexion, 2+/5 hip ER (limited by pain and ROM), 3+/5 on hip IR, 3+/5 on hip abduction, 2+/5 on hip extension, 5/5 on knee extension, and 4/5 on knee flexion; 12/03/18: L hip strength assesessed today for comparison: 4+/5 Hip flexion, 5/5 Hip ER in sitting, 5/5 Hip IR in sitting, 4/5 Hip abduction (reports pain on R hip from sidelying position), 4/5 Hip extension, 5/5 Knee extension, 4/5 Knee flexion 12/2: R hip flexion, abduction, and adduction 4-/5 hip extension 3-/5, 02/09/19: see flowsheet    Time  4    Period  Weeks    Status  Partially Met    Target Date  04/27/19      PT LONG TERM GOAL #4   Title  Pt will decrease worst hip pain as reported on NPRS by at least 3 points in order to demonstrate clinically significant reduction in hip pain.    Baseline  11/23/18: 9/10 12/2: 5/10, 02/09/19: 6/10, 3/1: 5-6/10    Time  4    Period  Weeks    Status  Partially Met    Target Date  04/27/19            Plan - 04/21/19 1208    Clinical Impression Statement  Patient motivated and participated well within session. PT applied TENs to right thigh concurrent with all exercise for pain control; She reports less pain with TENs. Pt would benefit from home TENs unit for pain control. Patient instructed in advanced strengthening. Focused on isometric to help reduce strain and overuse. Patient educated in proper positioning and exercise technique for safety; Patient would benefit from additional skilled PT intervention to improve strength and mobility while reducing pain;    Personal Factors and Comorbidities  Comorbidity 3+    Comorbidities  DDD, migraine, IBS,    Examination-Activity Limitations  Bend;Lift;Squat;Caring for Others;Locomotion Level;Stairs;Carry;Stand    Examination-Participation Restrictions  Interpersonal Relationship;Yard Work;Cleaning;Laundry;Community Activity;Shop;Driving;Meal Prep    Rehab Potential   Good    PT Frequency  2x / week    PT Duration  4 weeks    PT Treatment/Interventions  Cryotherapy;Electrical Stimulation;Iontophoresis 66m/ml Dexamethasone;Moist Heat;Ultrasound;DME Instruction;Gait training;Stair training;Functional mobility training;Therapeutic activities;Therapeutic exercise;Balance training;Neuromuscular re-education;Patient/family education;Manual techniques;Scar mobilization;Passive range of motion;Dry needling;Taping;Joint Manipulations;ADLs/Self Care Home Management;Aquatic Therapy;Canalith Repostioning;Vestibular;Spinal Manipulations    PT Next Visit Plan  progress strength and ROM per protocol    PT Home Exercise Plan  [email or web address]    Consulted and Agree with Plan of Care  Patient       Patient will benefit from skilled therapeutic intervention in order to improve the following deficits and impairments:  Decreased balance, Decreased endurance, Decreased range of motion, Pain, Decreased strength, Abnormal gait  Visit Diagnosis: Pain in right hip  Muscle weakness (generalized)     Problem List Patient Active Problem List   Diagnosis Date Noted  . Pain in right hip 01/12/2019  . Oste[place] of right [place] ([place] 11/12/2018  . Vitamin D deficiency 02/25/2018  . Anemia, unspecified 01/08/2017  . Chronic pain 10/24/2015  . Hot flashes 10/24/2015  . Mood changes 10/24/2015  . Migraine without aura and without status migrainosus, not intractable 10/24/2015  . Metabolic syndrome 086/38/1771 . Dyslipidemia 10/24/2015  . Allergic rhinitis, seasonal 05/17/2015  . Asthma, mild intermittent 08/08/2014  . Chronic insomnia 08/08/2014  . DDD (degenerative disc disease), lumbar 08/08/2014  . Mood disorder ([place] 08/08/2014  . Gastro-esophageal reflux disease without esophagitis 08/08/2014  . H/O total hysterectomy 08/08/2014  .  Irritable bowel syndrome with diarrhea 08/08/2014  . Osteoarthritis of right hip 08/08/2014  . Adult BMI 30+ 08/08/2014  . SBO (spina bifida  occulta) 08/08/2014    Francee Setzer PT, DPT 04/22/2019, 8:14 AM  [place] MAIN [facility] SERVICES [address] [place], [place], [address] Phone: [phone]   Fax:  [phone]  Name: Shelby Henson MRN: [id number] Date of Birth: 1970/06/14

## 2019-04-22 ENCOUNTER — Encounter: Payer: Self-pay | Admitting: Physical Therapy

## 2019-04-23 ENCOUNTER — Encounter: Payer: Self-pay | Admitting: Physical Therapy

## 2019-04-23 ENCOUNTER — Ambulatory Visit: Payer: No Typology Code available for payment source | Admitting: Physical Therapy

## 2019-04-23 ENCOUNTER — Other Ambulatory Visit: Payer: Self-pay

## 2019-04-23 DIAGNOSIS — M25551 Pain in right hip: Secondary | ICD-10-CM | POA: Diagnosis not present

## 2019-04-23 DIAGNOSIS — M6281 Muscle weakness (generalized): Secondary | ICD-10-CM

## 2019-04-23 NOTE — Therapy (Signed)
[place] MAIN [facility] SERVICES [address] [place], [place], [address] Phone: [phone]   Fax:  [phone]  Physical Therapy Treatment  Patient Details  Name: Shelby Henson MRN: [id number] Date of Birth: 05/20/1970 Referring Provider (PT): Dr. Harlow Mares   Encounter Date: 04/23/2019  PT End of Session - 04/23/19 0809    Visit Number  36    Number of Visits  41    Date for PT Re-Evaluation  04/27/19    Authorization Type  Goals last assessed 03/30/19    PT Start Time  0802    PT Stop Time  0845    PT Time Calculation (min)  43 min    Equipment Utilized During Treatment  Gait belt    Activity Tolerance  Patient tolerated treatment well    Behavior During Therapy  [facility] for tasks assessed/performed       Past Medical History:  Diagnosis Date  . Anemia   . Asthma    well controlled  . Complication of anesthesia   . Depression   . GERD (gastroesophageal reflux disease)    occ  . Migraine   . Osteoarthritis   . PONV (postoperative nausea and vomiting)    with c-section x 1    Past Surgical History:  Procedure Laterality Date  . ABDOMINAL HYSTERECTOMY    . ABLATION    . BREAST CYST ASPIRATION Left   . CESAREAN SECTION    . CESAREAN SECTION    . CESAREAN SECTION    . LAPAROSCOPY  2018  . LEEP    . TOTAL HIP ARTHROPLASTY Right 11/12/2018   Procedure: TOTAL HIP ARTHROPLASTY ANTERIOR APPROACH;  Surgeon: Lovell Sheehan, MD;  Location: [facility] ORS;  Service: Orthopedics;  Laterality: Right;  . TOTAL VAGINAL HYSTERECTOMY      There were no vitals filed for this visit.  Subjective Assessment - 04/23/19 0807    Subjective  Patient reports increased fatigue and soreness. She is concerned its from her 2nd vaccine. Patient reports continued RLE hip pain;    Pertinent History  Pt presents s/p right anterior total hip arthroplasty on 11/12/18.  She has completed 2 sessions of PT at another facility, however, her treatment sessions were not  one-on-one, and she was not getting the feedback she was looking for on her exercises.    Currently in Pain?  Yes    Pain Score  4     Pain Location  Hip    Pain Orientation  Right;Anterior    Pain Descriptors / Indicators  Aching;Tender    Pain Type  Chronic pain    Pain Onset  More than a month ago    Pain Frequency  Intermittent    Aggravating Factors   worse at night    Pain Relieving Factors  stretch/movement    Effect of Pain on Daily Activities  pushes through the pain;    Multiple Pain Sites  No             TREATMENT: Warm up on Crosstrainer, level2x3 min (unbilled):  PT applied interferential TENs to RLE anterior thigh at tolerated intensity #28, modulated session concurrent with all exercise for pain control;  Standing at steps: Hip flexor stretch 30 sec hold x2 reps bilaterally; Hamstring stretch 30 sec hold x2 reps bilaterally;  Leg Press: BLE: 85# x15 RLE only 55#, 2x10 Patient required min-moderate verbal/tactile cues for correct exercise technique.   PT applied cryotherapy to RLE concurrent with all exercise;  Prone: PT performed passive quad stretch to RLE 30 sec hold x2 reps; PT instructed patient in isometric gluteal max hip extension 5 sec hold x15 reps;  Patient sidelying: PT instructed patient in isometric clamshells 5 sec hold x10reps with manual resistance; PTperformed passive quad stretch 20 sec hold x2 reps; PT performed passive IT band stretch 20 sec hold x2 reps;  Hooklying: RLE IT band stretch 30 sec hold x2 reps;  Passive single knee to chest stretch RLE 20 sec hold x2 reps; Isometric hip flexion 5 sec hold x10 reps;   Response to treatment: Tolerated fair. Patient reports significant reduction in pain with isometric strengthening, LE stretches and TENs. She did require min VCs for proper positioning and exercise technique for optimal muscle activation.She reports mild soreness at end of session.                        PT Education - 04/23/19 0809    Education Details  exercise/TENs;    Person(s) Educated  Patient    Methods  Explanation;Verbal cues    Comprehension  Verbalized understanding;Returned demonstration;Verbal cues required;Need further instruction       PT Short Term Goals - 04/02/19 1247      PT SHORT TERM GOAL #1   Title  Pt will be independent with HEP in order to improve strength and decrease back pain in order to improve pain-free function at home and work.    Baseline  12/2: HEP compliant    Time  4    Period  Weeks    Status  Achieved    Target Date  03/30/19        PT Long Term Goals - 04/02/19 1247      PT LONG TERM GOAL #1   Title  Pt will increase her LEFS to greater than 60/80 in order to demonstrate a clinically significant change in order to improve her ability to perform ADLs and IADLs with less difficulty and to improve her quality of life.    Baseline  11/26/18: 9/80 12/2: 25/80, 02/09/19: 22/80, 03/30/19: 23/80    Time  4    Period  Weeks    Status  Not Met    Target Date  04/27/19      PT LONG TERM GOAL #2   Title  Pt will improve her R hip ROM to within 10 degrees of her L hip ROM  in order to improve her ability to perform ADLs and IADLs with less difficulty and to improve her quality of life.    Baseline  11/26/18: 28 degrees of R hip IR, 12 degrees of R hip ER, 108 degrees of hip flexion, 20 deg of hip abduction, and -2 deg of hip extension; 12/03/18: L hip assessed today for comparison: L hip IR 45 deg, L hip ER 40 deg, L hip flexion 124 deg, L hip abduction 40 deg, PROM form hip extension L 25 deg, R 8 deg 12/2: R hip : flexion 114, abductoin 31 degrees, extension 11 degrees, 02/09/19: see flowsheet    Time  4    Period  Weeks    Status  Achieved    Target Date  04/27/19      PT LONG TERM GOAL #3   Title  Pt will improve her R hip strength to a 4/5 in order to improve her ability to perform ADLs and IADLs with less  difficulty and to improve her quality of life.  Baseline  11/26/18: 3+/5 hip flexion, 2+/5 hip ER (limited by pain and ROM), 3+/5 on hip IR, 3+/5 on hip abduction, 2+/5 on hip extension, 5/5 on knee extension, and 4/5 on knee flexion; 12/03/18: L hip strength assesessed today for comparison: 4+/5 Hip flexion, 5/5 Hip ER in sitting, 5/5 Hip IR in sitting, 4/5 Hip abduction (reports pain on R hip from sidelying position), 4/5 Hip extension, 5/5 Knee extension, 4/5 Knee flexion 12/2: R hip flexion, abduction, and adduction 4-/5 hip extension 3-/5, 02/09/19: see flowsheet    Time  4    Period  Weeks    Status  Partially Met    Target Date  04/27/19      PT LONG TERM GOAL #4   Title  Pt will decrease worst hip pain as reported on NPRS by at least 3 points in order to demonstrate clinically significant reduction in hip pain.    Baseline  11/23/18: 9/10 12/2: 5/10, 02/09/19: 6/10, 3/1: 5-6/10    Time  4    Period  Weeks    Status  Partially Met    Target Date  04/27/19            Plan - 04/23/19 0956    Clinical Impression Statement  Patient reports less pain with TENs/cryotherapy during exercise. patient reports less discomfort with isometric exercise. She denies any catching during exercise. She responds well to stretches reporting improved flexibility and less tightness. Patient re-educated in proper positioning/HEP for pain control. She would benefit from additional skilled PT intervention to improve strength, balance and mobility;    Personal Factors and Comorbidities  Comorbidity 3+    Comorbidities  DDD, migraine, IBS,    Examination-Activity Limitations  Bend;Lift;Squat;Caring for Others;Locomotion Level;Stairs;Carry;Stand    Examination-Participation Restrictions  Interpersonal Relationship;Yard Work;Cleaning;Laundry;Community Activity;Shop;Driving;Meal Prep    Rehab Potential  Good    PT Frequency  2x / week    PT Duration  4 weeks    PT Treatment/Interventions   Cryotherapy;Electrical Stimulation;Iontophoresis 42m/ml Dexamethasone;Moist Heat;Ultrasound;DME Instruction;Gait training;Stair training;Functional mobility training;Therapeutic activities;Therapeutic exercise;Balance training;Neuromuscular re-education;Patient/family education;Manual techniques;Scar mobilization;Passive range of motion;Dry needling;Taping;Joint Manipulations;ADLs/Self Care Home Management;Aquatic Therapy;Canalith Repostioning;Vestibular;Spinal Manipulations    PT Next Visit Plan  progress strength and ROM per protocol    PT Home Exercise Plan  [email or web address]    Consulted and Agree with Plan of Care  Patient       Patient will benefit from skilled therapeutic intervention in order to improve the following deficits and impairments:  Decreased balance, Decreased endurance, Decreased range of motion, Pain, Decreased strength, Abnormal gait  Visit Diagnosis: Pain in right hip  Muscle weakness (generalized)     Problem List Patient Active Problem List   Diagnosis Date Noted  . Pain in right hip 01/12/2019  . Osteonecrosis of right hip ([place] 11/12/2018  . Vitamin D deficiency 02/25/2018  . Anemia, unspecified 01/08/2017  . Chronic pain 10/24/2015  . Hot flashes 10/24/2015  . Mood changes 10/24/2015  . Migraine without aura and without status migrainosus, not intractable 10/24/2015  . Metabolic syndrome 035/46/5681 . Dyslipidemia 10/24/2015  . Allergic rhinitis, seasonal 05/17/2015  . Asthma, mild intermittent 08/08/2014  . Chronic insomnia 08/08/2014  . DDD (degenerative disc disease), lumbar 08/08/2014  . Mood disorder ([place] 08/08/2014  . Gastro-esophageal reflux disease without esophagitis 08/08/2014  . H/O total hysterectomy 08/08/2014  . Irritable bowel syndrome with diarrhea 08/08/2014  . Osteoarthritis of right hip 08/08/2014  . Adult BMI 30+ 08/08/2014  . SBO (spina bifida occulta) 08/08/2014  Shelby Henson PT, DPT 04/23/2019, 9:57 AM  [place] MAIN [facility] SERVICES [address] [place], [place], [address] Phone: [phone]   Fax:  [phone]  Name: Shelby Henson MRN: [id number] Date of Birth: 1970-11-25

## 2019-04-28 ENCOUNTER — Other Ambulatory Visit: Payer: Self-pay

## 2019-04-28 ENCOUNTER — Ambulatory Visit: Payer: No Typology Code available for payment source | Admitting: Physical Therapy

## 2019-04-28 ENCOUNTER — Encounter: Payer: Self-pay | Admitting: Physical Therapy

## 2019-04-28 DIAGNOSIS — M6281 Muscle weakness (generalized): Secondary | ICD-10-CM

## 2019-04-28 DIAGNOSIS — M25551 Pain in right hip: Secondary | ICD-10-CM

## 2019-04-28 NOTE — Therapy (Signed)
[place] MAIN [facility] SERVICES [address] [place], [place], [address] Phone: [phone]   Fax:  [phone]  Physical Therapy Treatment  Patient Details  Name: Shelby Henson MRN: [id number] Date of Birth: 07/12/1970 Referring Provider (PT): Dr. Harlow Mares   Encounter Date: 04/28/2019  PT End of Session - 04/28/19 1048    Visit Number  37    Number of Visits  89    Date for PT Re-Evaluation  05/26/19    Authorization Type  Goals last assessed 04/28/19    PT Start Time  1025    PT Stop Time  1105    PT Time Calculation (min)  40 min    Equipment Utilized During Treatment  Gait belt    Activity Tolerance  Patient tolerated treatment well    Behavior During Therapy  [facility] for tasks assessed/performed       Past Medical History:  Diagnosis Date  . Anemia   . Asthma    well controlled  . Complication of anesthesia   . Depression   . GERD (gastroesophageal reflux disease)    occ  . Migraine   . Osteoarthritis   . PONV (postoperative nausea and vomiting)    with c-section x 1    Past Surgical History:  Procedure Laterality Date  . ABDOMINAL HYSTERECTOMY    . ABLATION    . BREAST CYST ASPIRATION Left   . CESAREAN SECTION    . CESAREAN SECTION    . CESAREAN SECTION    . LAPAROSCOPY  2018  . LEEP    . TOTAL HIP ARTHROPLASTY Right 11/12/2018   Procedure: TOTAL HIP ARTHROPLASTY ANTERIOR APPROACH;  Surgeon: Lovell Sheehan, MD;  Location: [facility] ORS;  Service: Orthopedics;  Laterality: Right;  . TOTAL VAGINAL HYSTERECTOMY      There were no vitals filed for this visit.  Subjective Assessment - 04/28/19 1042    Subjective  Patient reports increased RLE hip pain. She reports yesterday was terrible, but its a little better today;    Pertinent History  Pt presents s/p right anterior total hip arthroplasty on 11/12/18.  She has completed 2 sessions of PT at another facility, however, her treatment sessions were not one-on-one, and she was not  getting the feedback she was looking for on her exercises.    Currently in Pain?  Yes    Pain Score  5     Pain Location  Hip    Pain Orientation  Right;Anterior;Lateral    Pain Descriptors / Indicators  Aching;Tender    Pain Type  Chronic pain    Pain Onset  More than a month ago    Pain Frequency  Intermittent    Aggravating Factors   worse at night    Pain Relieving Factors  stretch/movement/TENs    Effect of Pain on Daily Activities  pushes through the pain    Multiple Pain Sites  No         [facility] PT Assessment - 04/29/19 0001      Observation/Other Assessments   Lower Extremity Functional Scale   23/80      Strength   Right Hip Extension  4-/5    Right Hip ABduction  4-/5         TREATMENT: Warm up on Crosstrainer, level2x3 min (unbilled):  PT applied interferential TENs to RLE anterior thigh at tolerated intensity #28, modulated session concurrent with all exercise for pain control;  PT applied cryotherapy to RLE concurrent with  all exercise;  Prone: PT performed passive quad stretch to RLE 30 sec hold x2 reps; Hamstring curl 3# x15 RLE only RLE hip extension 3#, 2 x10 PT instructed patient in isometric gluteal max hip extension 5 sec hold x15 reps;  Hooklying: RLE gluteal isometric (bridge) 5 sec hold x10 reps with LLE lifted;  RLE IT band stretch 30 sec hold x2 reps;  Passive single knee to chest stretch RLE 20 sec hold x2 reps; Isometric hip flexion 5 sec hold x10 reps;  Educated patient in seated and standing isometric hip abductor strengthening;   Provided written HEP for better adherence;   Response to treatment: Toleratedfair. Patient reports significant reduction in pain with isometric strengthening, LE stretches and TENs. She did require min VCs for proper positioning and exercise technique for optimal muscle activation.She reportsmild soreness at end of session.                    PT Education - 04/28/19 1139     Education Details  exercise/TENs/plan of care    Person(s) Educated  Patient    Methods  Explanation;Verbal cues    Comprehension  Verbalized understanding;Returned demonstration;Verbal cues required;Need further instruction       PT Short Term Goals - 04/02/19 1247      PT SHORT TERM GOAL #1   Title  Pt will be independent with HEP in order to improve strength and decrease back pain in order to improve pain-free function at home and work.    Baseline  12/2: HEP compliant    Time  4    Period  Weeks    Status  Achieved    Target Date  03/30/19        PT Long Term Goals - 04/29/19 0841      PT LONG TERM GOAL #1   Title  Pt will increase her LEFS to greater than 60/80 in order to demonstrate a clinically significant change in order to improve her ability to perform ADLs and IADLs with less difficulty and to improve her quality of life.    Baseline  11/26/18: 9/80 12/2: 25/80, 02/09/19: 22/80, 03/30/19: 23/80    Time  4    Period  Weeks    Status  Not Met    Target Date  05/26/19      PT LONG TERM GOAL #2   Title  Pt will improve her R hip ROM to within 10 degrees of her L hip ROM  in order to improve her ability to perform ADLs and IADLs with less difficulty and to improve her quality of life.    Baseline  11/26/18: 28 degrees of R hip IR, 12 degrees of R hip ER, 108 degrees of hip flexion, 20 deg of hip abduction, and -2 deg of hip extension; 12/03/18: L hip assessed today for comparison: L hip IR 45 deg, L hip ER 40 deg, L hip flexion 124 deg, L hip abduction 40 deg, PROM form hip extension L 25 deg, R 8 deg 12/2: R hip : flexion 114, abductoin 31 degrees, extension 11 degrees, 02/09/19: see flowsheet    Time  4    Period  Weeks    Status  Achieved    Target Date  05/26/19      PT LONG TERM GOAL #3   Title  Pt will improve her R hip strength to a 4/5 in order to improve her ability to perform ADLs and IADLs with less difficulty and to improve  her quality of life.    Baseline   11/26/18: 3+/5 hip flexion, 2+/5 hip ER (limited by pain and ROM), 3+/5 on hip IR, 3+/5 on hip abduction, 2+/5 on hip extension, 5/5 on knee extension, and 4/5 on knee flexion; 12/03/18: L hip strength assesessed today for comparison: 4+/5 Hip flexion, 5/5 Hip ER in sitting, 5/5 Hip IR in sitting, 4/5 Hip abduction (reports pain on R hip from sidelying position), 4/5 Hip extension, 5/5 Knee extension, 4/5 Knee flexion 12/2: R hip flexion, abduction, and adduction 4-/5 hip extension 3-/5, 02/09/19: see flowsheet    Time  4    Period  Weeks    Status  Partially Met    Target Date  05/26/19      PT LONG TERM GOAL #4   Title  Pt will decrease worst hip pain as reported on NPRS by at least 3 points in order to demonstrate clinically significant reduction in hip pain.    Baseline  11/23/18: 9/10 12/2: 5/10, 02/09/19: 6/10, 3/1: 5-6/10    Time  4    Period  Weeks    Status  Partially Met    Target Date  05/26/19            Plan - 04/28/19 1140    Clinical Impression Statement  Patient continues to respond well to TENs and cryotherapy with less pain in right hip. Although patient reports pain relief is temporary and will return within hours after each session. Patient instructed in advanced HEP with isometric strengthening to help reduce discomfort with exercise. She is concerned that she might be walking too much at work which could be contributing to pain exacerbation. Educated patient in physiology of chronic pain and ways to help manage including gentle exercise/rest. Patient would benefit from additional skilled PT intervention to improve strength and mobility while reducing hip pain;    Personal Factors and Comorbidities  Comorbidity 3+    Comorbidities  DDD, migraine, IBS,    Examination-Activity Limitations  Bend;Lift;Squat;Caring for Others;Locomotion Level;Stairs;Carry;Stand    Examination-Participation Restrictions  Interpersonal Relationship;Yard Work;Cleaning;Laundry;Community  Activity;Shop;Driving;Meal Prep    Rehab Potential  Good    PT Frequency  2x / week    PT Duration  4 weeks    PT Treatment/Interventions  Cryotherapy;Electrical Stimulation;Iontophoresis 41m/ml Dexamethasone;Moist Heat;Ultrasound;DME Instruction;Gait training;Stair training;Functional mobility training;Therapeutic activities;Therapeutic exercise;Balance training;Neuromuscular re-education;Patient/family education;Manual techniques;Scar mobilization;Passive range of motion;Dry needling;Taping;Joint Manipulations;ADLs/Self Care Home Management;Aquatic Therapy;Canalith Repostioning;Vestibular;Spinal Manipulations    PT Next Visit Plan  progress strength and ROM per protocol    PT Home Exercise Plan  [email or web address]    Consulted and Agree with Plan of Care  Patient       Patient will benefit from skilled therapeutic intervention in order to improve the following deficits and impairments:  Decreased balance, Decreased endurance, Decreased range of motion, Pain, Decreased strength, Abnormal gait  Visit Diagnosis: Pain in right hip  Muscle weakness (generalized)     Problem List Patient Active Problem List   Diagnosis Date Noted  . Pain in right hip 01/12/2019  . Oste[place]rosis of right [place] ([place] 11/12/2018  . Vitamin D deficiency 02/25/2018  . Anemia, unspecified 01/08/2017  . Chronic pain 10/24/2015  . Hot flashes 10/24/2015  . Mood changes 10/24/2015  . Migraine without aura and without status migrainosus, not intractable 10/24/2015  . Metabolic syndrome 070/62/3762 . Dyslipidemia 10/24/2015  . Allergic rhinitis, seasonal 05/17/2015  . Asthma, mild intermittent 08/08/2014  . Chronic insomnia 08/08/2014  . DDD (degenerative disc disease), lumbar  08/08/2014  . Mood disorder ([place]) 08/08/2014  . Gastro-esophageal reflux disease without esophagitis 08/08/2014  . H/O total hysterectomy 08/08/2014  . Irritable bowel syndrome with diarrhea 08/08/2014  . Osteoarthritis of right hip 08/08/2014   . Adult BMI 30+ 08/08/2014  . SBO (spina bifida occulta) 08/08/2014    Ryu Cerreta PT, DPT 04/29/2019, 8:42 AM  [place] MAIN [facility] SERVICES [address] [place], [place], [address] Phone: [phone]   Fax:  [phone]  Name: CHELSAE ZANELLA MRN: [id number] Date of Birth: 16-May-1970

## 2019-04-28 NOTE — Patient Instructions (Signed)
Access Code: [email or web address] URL: [email or web address] Date: 04/28/2019 Prepared by: Blanche East  Exercises Supine 90/90 Isometric Hip Extension - 1 x daily - 7 x weekly - 2 sets - 10 reps Long Sitting Isometric Hip Abduction with Ball at [employer] 1 x daily - 7 x weekly - 2 sets - 10 reps - 5 sec hold Standing Isometric Hip Abduction with Ball on Wall - 1 x daily - 7 x weekly - 2 sets - 10 reps - 5 sec hold

## 2019-04-30 ENCOUNTER — Ambulatory Visit: Payer: No Typology Code available for payment source | Admitting: Physical Therapy

## 2019-05-04 ENCOUNTER — Ambulatory Visit: Payer: No Typology Code available for payment source | Admitting: Physical Therapy

## 2019-05-06 ENCOUNTER — Ambulatory Visit: Payer: No Typology Code available for payment source | Admitting: Physical Therapy

## 2019-05-11 ENCOUNTER — Encounter: Payer: No Typology Code available for payment source | Admitting: Physical Therapy

## 2019-05-13 ENCOUNTER — Ambulatory Visit: Payer: No Typology Code available for payment source | Admitting: Physical Therapy

## 2019-05-18 ENCOUNTER — Encounter: Payer: Self-pay | Admitting: Physical Therapy

## 2019-05-18 NOTE — Therapy (Signed)
[place] MAIN [facility] SERVICES [address] [place], [place], [address] Phone: [phone]   Fax:  [phone]  May 18, 2019   No Recipients  Physical Therapy Discharge Summary  Patient: KAMYAH WILHELMSEN  MRN: [id number]  Date of Birth: 10/18/1970   Diagnosis: No diagnosis found. Referring Provider (PT): Dr. Harlow Mares   The above patient had been seen in Physical Therapy 37 times of 49 treatments scheduled with 0 no shows and 6 cancellations.  The treatment consisted of strengthening, manual therapy, TENs, gait training/balance exercise, HEP The patient is: Improved  Subjective: Pt reports continued right hip pain and reports continued weakness in posterior right hip. She would like to stop therapy at this time and focus on HEP. She is also hoping to get a home TENs unit for pain management;   Discharge Findings: Pt failed to return to final session, unable to obtain objective measures. See last note dated 04/28/19    Goals Partially Met    Sincerely,   Elisha Mcgruder, PT, DPT   CC No Recipients  [place] MAIN [facility] SERVICES [address] [place], [place], [address] Phone: [phone]   Fax:  [phone]  Patient: GUILLERMO DIFRANCESCO  MRN: [id number]  Date of Birth: [birth year]

## 2019-05-25 ENCOUNTER — Other Ambulatory Visit: Payer: Self-pay

## 2019-05-25 ENCOUNTER — Encounter: Payer: Self-pay | Admitting: Family Medicine

## 2019-05-25 ENCOUNTER — Ambulatory Visit (INDEPENDENT_AMBULATORY_CARE_PROVIDER_SITE_OTHER): Payer: No Typology Code available for payment source | Admitting: Family Medicine

## 2019-05-25 VITALS — BP 124/78 | HR 82 | Temp 97.5°F | Resp 16 | Ht 59.0 in | Wt 193.4 lb

## 2019-05-25 DIAGNOSIS — E785 Hyperlipidemia, unspecified: Secondary | ICD-10-CM

## 2019-05-25 DIAGNOSIS — R42 Dizziness and giddiness: Secondary | ICD-10-CM

## 2019-05-25 DIAGNOSIS — G43009 Migraine without aura, not intractable, without status migrainosus: Secondary | ICD-10-CM

## 2019-05-25 DIAGNOSIS — J4521 Mild intermittent asthma with (acute) exacerbation: Secondary | ICD-10-CM

## 2019-05-25 DIAGNOSIS — E8881 Metabolic syndrome: Secondary | ICD-10-CM

## 2019-05-25 DIAGNOSIS — D509 Iron deficiency anemia, unspecified: Secondary | ICD-10-CM

## 2019-05-25 DIAGNOSIS — F39 Unspecified mood [affective] disorder: Secondary | ICD-10-CM

## 2019-05-25 DIAGNOSIS — E559 Vitamin D deficiency, unspecified: Secondary | ICD-10-CM

## 2019-05-25 DIAGNOSIS — Z96641 Presence of right artificial hip joint: Secondary | ICD-10-CM

## 2019-05-25 DIAGNOSIS — E669 Obesity, unspecified: Secondary | ICD-10-CM

## 2019-05-25 LAB — CBC WITH DIFFERENTIAL/PLATELET
Absolute Monocytes: 667 cells/uL (ref 200–950)
Basophils Absolute: 61 cells/uL (ref 0–200)
Basophils Relative: 0.6 %
Eosinophils Absolute: 879 cells/uL — ABNORMAL HIGH (ref 15–500)
Eosinophils Relative: 8.7 %
HCT: 36.3 % (ref 35.0–45.0)
Hemoglobin: 11.7 g/dL (ref 11.7–15.5)
Lymphs Abs: 2868 cells/uL (ref 850–3900)
MCH: 26.7 pg — ABNORMAL LOW (ref 27.0–33.0)
MCHC: 32.2 g/dL (ref 32.0–36.0)
MCV: 82.7 fL (ref 80.0–100.0)
MPV: 12.2 fL (ref 7.5–12.5)
Monocytes Relative: 6.6 %
Neutro Abs: 5626 cells/uL (ref 1500–7800)
Neutrophils Relative %: 55.7 %
Platelets: 285 10*3/uL (ref 140–400)
RBC: 4.39 10*6/uL (ref 3.80–5.10)
RDW: 14.9 % (ref 11.0–15.0)
Total Lymphocyte: 28.4 %
WBC: 10.1 10*3/uL (ref 3.8–10.8)

## 2019-05-25 LAB — IRON,TIBC AND FERRITIN PANEL
%SAT: 12 % (calc) — ABNORMAL LOW (ref 16–45)
Ferritin: 21 ng/mL (ref 16–232)
Iron: 50 ug/dL (ref 45–160)
TIBC: 431 mcg/dL (calc) (ref 250–450)

## 2019-05-25 MED ORDER — OZEMPIC (0.25 OR 0.5 MG/DOSE) 2 MG/1.5ML ~~LOC~~ SOPN: 0.5000 mg | PEN_INJECTOR | SUBCUTANEOUS | 2 refills | Status: DC

## 2019-05-25 NOTE — Progress Notes (Signed)
Name: Shelby Henson   MRN: [id number]    DOB: [birth year]   Date:05/25/2019       Progress Note  Subjective  Chief Complaint  Chief Complaint  Patient presents with  . Medication Refill  . Fatigue    Feels like her iron is low-not sleeping well neither  . Asthma    Has been good  . Mood Swings  . Chronic hip pain    Chronic pain  . Dyslipidemia  . Metabolic Syndrome  . Migraine    Has been good    HPI   Asthma Mild Intermittent:she has been doing well, no wheezing, coughing or sob. She has not used her neb machine in a long time. She does not respond to rescue inhaler.   Mood swings:she has been on Pritiqfor many years, she has afamily history of bipolar,we tried Depakote but she is not sure why she stopped.She is doing well with new job, likes working at [employer] 9 positive but she states because of not covering from hip surgery   History of right hip replacement : going to [facility] pain clinic, had surgery done 10/2018, still has daily pain, on modified duty at work and being sedentary has caused weight gain, and she is feeling discouraged about it. Taking oxydone daily. Pain is constant at a4/10 right now  Obesity: trying a low carb diet, her weight has gone up from 178 lbs in January 2020 and she is 193 lbs today ( her heaviest ever - even during pregnancies) , discussed importance of checking her weight weekly, also consider swimming . She would like to try  medication  Dyslipidemia: discussed life style modification   Metabolic syndrome: she gained weight since hip surgery in October. She denies  polyphagia, polydipsia or polyuria.Eating a low carb diet at this time, she would like help losing weight. No family history of thyroid cancer, no personal history of pancreatitis.   Vertigo: she states had one episode of room spinning while at [facility], she stooped down to pick something from a bottom shelf and the entire room started to spin, not associated with  nausea, vomiting or tinnitus. It lasted a few seconds and resolved by itself. Since episode she has intermittent minor episodes, discussed BBPV and referral to ENT but she states she is okay, she will try maneuvers at home   Patient Active Problem List   Diagnosis Date Noted  . Pain in right hip 01/12/2019  . Osteonecrosis of right hip ([place]) 11/12/2018  . Vitamin D deficiency 02/25/2018  . Anemia, unspecified 01/08/2017  . Chronic pain 10/24/2015  . Hot flashes 10/24/2015  . Mood changes 10/24/2015  . Migraine without aura and without status migrainosus, not intractable 10/24/2015  . Metabolic syndrome [id number]  . Dyslipidemia 10/24/2015  . Allergic rhinitis, seasonal 05/17/2015  . Asthma, mild intermittent 08/08/2014  . Chronic insomnia 08/08/2014  . DDD (degenerative disc disease), lumbar 08/08/2014  . Mood disorder ([place]) 08/08/2014  . Gastro-esophageal reflux disease without esophagitis 08/08/2014  . H/O total hysterectomy 08/08/2014  . Irritable bowel syndrome with diarrhea 08/08/2014  . Osteoarthritis of right hip 08/08/2014  . Adult BMI 30+ 08/08/2014  . SBO (spina bifida occulta) 08/08/2014    Past Surgical History:  Procedure Laterality Date  . ABDOMINAL HYSTERECTOMY    . ABLATION    . BREAST CYST ASPIRATION Left   . CESAREAN SECTION    . CESAREAN SECTION    . CESAREAN SECTION    . LAPAROSCOPY  2018  .  LEEP    . TOTAL HIP ARTHROPLASTY Right 11/12/2018   Procedure: TOTAL HIP ARTHROPLASTY ANTERIOR APPROACH;  Surgeon: Lovell Sheehan, MD;  Location: [facility] ORS;  Service: Orthopedics;  Laterality: Right;  . TOTAL VAGINAL HYSTERECTOMY      Family History  Problem Relation Age of Onset  . Bipolar disorder Brother   . Depression Mother   . Breast cancer Neg Hx     Social History   Tobacco Use  . Smoking status: Never Smoker  . Smokeless tobacco: Never Used  Substance Use Topics  . Alcohol use: Yes    Alcohol/week: 0.0 standard drinks    Comment: occasionally      Current Outpatient Medications:  .  albuterol (PROVENTIL) (2.5 MG/3ML) 0.083% nebulizer solution, Take 3 mLs (2.5 mg total) by nebulization every 6 (six) hours as needed for wheezing or shortness of breath., Disp: 75 mL, Rfl: 2 .  APPLE CIDER VINEGAR PO, Take 2 tablets by mouth daily. [place] Apple Cider Vinegar Gummy Vitamins, Disp: , Rfl:  .  baclofen (LIORESAL) 10 MG tablet, Take 10 mg by mouth 3 (three) times daily., Disp: , Rfl:  .  budesonide (PULMICORT) 0.5 MG/2ML nebulizer solution, Take 2 mLs (0.5 mg total) by nebulization 2 (two) times daily., Disp: 60 mL, Rfl: 5 .  desvenlafaxine (PRISTIQ) 50 MG 24 hr tablet, Take 1 tablet (50 mg total) by mouth daily. (Patient taking differently: Take 50 mg by mouth every morning. ), Disp: 30 tablet, Rfl: 5 .  Oxycodone HCl 10 MG TABS, Take 10 mg by mouth 4 (four) times daily as needed., Disp: , Rfl:   Allergies  Allergen Reactions  . Celecoxib Hives  . Prednisone     intolerance, makes her angry. Makes her angry  . Topamax [Topiramate]     Mood changes  . Zolpidem   . Codeine Rash  . Penicillins Rash    Did it involve swelling of the face/tongue/throat, SOB, or low BP? No Did it involve sudden or severe rash/hives, skin peeling, or any reaction on the inside of your mouth or nose? Yes Did you need to seek medical attention at a hospital or doctor's office? Yes When did it last happen? More than 20 years ago If all above answers are "NO", may proceed with cephalosporin use.     I personally reviewed active problem list, medication list, allergies, family history, social history, health maintenance with the patient/caregiver today.   ROS  Constitutional: Negative for fever , positive for  weight change.  Respiratory: Negative for cough and shortness of breath.   Cardiovascular: Negative for chest pain or palpitations.  Gastrointestinal: Negative for abdominal pain, no bowel changes.  Musculoskeletal: Positive for gait problem  but no  joint swelling.  Skin: Negative for rash.  Neurological: one episode of dizziness or headache.  No other specific complaints in a complete review of systems (except as listed in HPI above).  Objective  Vitals:   05/25/19 1535  BP: 124/78  Pulse: 82  Resp: 16  Temp: (!) 97.5 F (36.4 C)  TempSrc: Temporal  SpO2: 98%  Weight: 193 lb 6.4 oz (87.7 kg)  Height: 4\' 11"  (1.499 m)    Body mass index is 39.06 kg/m.  Physical Exam  Constitutional: Patient appears well-developed and well-nourished. Obese  No distress.  HEENT: head atraumatic, normocephalic, pupils equal and reactive to light, ears normal TM, neck supple, throat within normal limits Cardiovascular: Normal rate, regular rhythm and normal heart sounds.  No murmur heard.  No BLE edema. Pulmonary/Chest: Effort normal and breath sounds normal. No respiratory distress. Abdominal: Soft.  There is no tenderness. Psychiatric: Patient has a normal mood and affect. behavior is normal. Judgment and thought content normal.  PHQ2/9: Depression screen [facility] 2/9 05/25/2019 09/04/2018 02/25/2018 09/03/2017 02/04/2017  Decreased Interest 2 1 0 3 3  Down, Depressed, Hopeless 0 1 0 1 2  PHQ - 2 Score 2 2 0 4 5  Altered sleeping 3 3 3 3 3   Tired, decreased energy 3 3 0 3 3  Change in appetite 0 1 0 0 0  Feeling bad or failure about yourself  0 0 0 0 0  Trouble concentrating 1 0 3 2 1   Moving slowly or fidgety/restless 0 1 0 0 0  Suicidal thoughts 0 0 0 0 0  PHQ-9 Score 9 10 6 12 12   Difficult doing work/chores Very difficult Somewhat difficult Somewhat difficult Very difficult Very difficult    phq 9 is positive/ she states she is not feeling sad, but has pain and aggravated because of her right hip surgery    Fall Risk: Fall Risk  05/25/2019 09/04/2018 02/25/2018 09/03/2017 02/04/2017  Falls in the past year? 0 0 0 No No  Number falls in past yr: 0 0 0 - -  Injury with Fall? 0 0 0 - -  Comment - - - - -     Functional Status  Survey: Is the patient deaf or have difficulty hearing?: No Does the patient have difficulty seeing, even when wearing glasses/contacts?: No Does the patient have difficulty concentrating, remembering, or making decisions?: No Does the patient have difficulty walking or climbing stairs?: No Does the patient have difficulty dressing or bathing?: No Does the patient have difficulty doing errands alone such as visiting a doctor's office or shopping?: No   Assessment & Plan  1. Vertigo  Discussed BBPV  2. Mood disorder ([place])  Continue Pristiq   3. Dyslipidemia  Discussed life style modification   4. Vitamin D deficiency   5. Mild intermittent asthma with acute exacerbation  Doing well at this time   6. Metabolic syndrome  We will try Ozempic and if not covered we will switch to [place] for weight loss   7. Migraine without aura and without status migrainosus, not intractable  Doing well , no recent episodes   8. Obesity, Class II, BMI 35-39.9  Discussed medication options  9. History of hip replacement, total, right  Still in pain   10. Iron deficiency anemia, unspecified iron deficiency anemia type  - CBC with Differential/Platelet - Iron, TIBC and Ferritin Panel.

## 2019-05-25 NOTE — Patient Instructions (Signed)
Benign Positional Vertigo Vertigo is the feeling that you or your surroundings are moving when they are not. Benign positional vertigo is the most common form of vertigo. This is usually a harmless condition (benign). This condition is positional. This means that symptoms are triggered by certain movements and positions. This condition can be dangerous if it occurs while you are doing something that could cause harm to you or others. This includes activities such as driving or operating machinery. What are the causes? In many cases, the cause of this condition is not known. It may be caused by a disturbance in an area of the inner ear that helps your brain to sense movement and balance. This disturbance can be caused by:  Viral infection (labyrinthitis).  Head injury.  Repetitive motion, such as jumping, dancing, or running. What increases the risk? You are more likely to develop this condition if:  You are a woman.  You are 50 years of age or older. What are the signs or symptoms? Symptoms of this condition usually happen when you move your head or your eyes in different directions. Symptoms may start suddenly, and usually last for less than a minute. They include:  Loss of balance and falling.  Feeling like you are spinning or moving.  Feeling like your surroundings are spinning or moving.  Nausea and vomiting.  Blurred vision.  Dizziness.  Involuntary eye movement (nystagmus). Symptoms can be mild and cause only minor problems, or they can be severe and interfere with daily life. Episodes of benign positional vertigo may return (recur) over time. Symptoms may improve over time. How is this diagnosed? This condition may be diagnosed based on:  Your medical history.  Physical exam of the head, neck, and ears.  Tests, such as: ? MRI. ? CT scan. ? Eye movement tests. Your health care provider may ask you to change positions quickly while he or she watches you for symptoms  of benign positional vertigo, such as nystagmus. Eye movement may be tested with a variety of exams that are designed to evaluate or stimulate vertigo. ? An electroencephalogram (EEG). This records electrical activity in your brain. ? Hearing tests. You may be referred to a health care provider who specializes in ear, nose, and throat (ENT) problems (otolaryngologist) or a provider who specializes in disorders of the nervous system (neurologist). How is this treated?  This condition may be treated in a session in which your health care provider moves your head in specific positions to adjust your inner ear back to normal. Treatment for this condition may take several sessions. Surgery may be needed in severe cases, but this is rare. In some cases, benign positional vertigo may resolve on its own in 2-4 weeks. Follow these instructions at home: Safety  Move slowly. Avoid sudden body or head movements or certain positions, as told by your health care provider.  Avoid driving until your health care provider says it is safe for you to do so.  Avoid operating heavy machinery until your health care provider says it is safe for you to do so.  Avoid doing any tasks that would be dangerous to you or others if vertigo occurs.  If you have trouble walking or keeping your balance, try using a cane for stability. If you feel dizzy or unstable, sit down right away.  Return to your normal activities as told by your health care provider. Ask your health care provider what activities are safe for you. General instructions  Take over-the-counter   and prescription medicines only as told by your health care provider.  Drink enough fluid to keep your urine pale yellow.  Keep all follow-up visits as told by your health care provider. This is important. Contact a health care provider if:  You have a fever.  Your condition gets worse or you develop new symptoms.  Your family or friends notice any  behavioral changes.  You have nausea or vomiting that gets worse.  You have numbness or a "pins and needles" sensation. Get help right away if you:  Have difficulty speaking or moving.  Are always dizzy.  Faint.  Develop severe headaches.  Have weakness in your legs or arms.  Have changes in your hearing or vision.  Develop a stiff neck.  Develop sensitivity to light. Summary  Vertigo is the feeling that you or your surroundings are moving when they are not. Benign positional vertigo is the most common form of vertigo.  The cause of this condition is not known. It may be caused by a disturbance in an area of the inner ear that helps your brain to sense movement and balance.  Symptoms include loss of balance and falling, feeling that you or your surroundings are moving, nausea and vomiting, and blurred vision.  This condition can be diagnosed based on symptoms, physical exam, and other tests, such as MRI, CT scan, eye movement tests, and hearing tests.  Follow safety instructions as told by your health care provider. You will also be told when to contact your health care provider in case of problems. This information is not intended to replace advice given to you by your health care provider. Make sure you discuss any questions you have with your health care provider. Document Revised: 06/26/2017 Document Reviewed: 06/26/2017 Elsevier Patient Education  2020 Elsevier Inc.  How to Perform the Epley Maneuver The Epley maneuver is an exercise that relieves symptoms of vertigo. Vertigo is the feeling that you or your surroundings are moving when they are not. When you feel vertigo, you may feel like the room is spinning and have trouble walking. Dizziness is a little different than vertigo. When you are dizzy, you may feel unsteady or light-headed. You can do this maneuver at home whenever you have symptoms of vertigo. You can do it up to 3 times a day until your symptoms go  away. Even though the Epley maneuver may relieve your vertigo for a few weeks, it is possible that your symptoms will return. This maneuver relieves vertigo, but it does not relieve dizziness. What are the risks? If it is done correctly, the Epley maneuver is considered safe. Sometimes it can lead to dizziness or nausea that goes away after a short time. If you develop other symptoms, such as changes in vision, weakness, or numbness, stop doing the maneuver and call your health care provider. How to perform the Epley maneuver 1. Sit on the edge of a bed or table with your back straight and your legs extended or hanging over the edge of the bed or table. 2. Turn your head halfway toward the affected ear or side. 3. Lie backward quickly with your head turned until you are lying flat on your back. You may want to position a pillow under your shoulders. 4. Hold this position for 30 seconds. You may experience an attack of vertigo. This is normal. 5. Turn your head to the opposite direction until your unaffected ear is facing the floor. 6. Hold this position for 30 seconds. You   may experience an attack of vertigo. This is normal. Hold this position until the vertigo stops. 7. Turn your whole body to the same side as your head. Hold for another 30 seconds. 8. Sit back up. You can repeat this exercise up to 3 times a day. Follow these instructions at home:  After doing the Epley maneuver, you can return to your normal activities.  Ask your health care provider if there is anything you should do at home to prevent vertigo. He or she may recommend that you: ? Keep your head raised (elevated) with two or more pillows while you sleep. ? Do not sleep on the side of your affected ear. ? Get up slowly from bed. ? Avoid sudden movements during the day. ? Avoid extreme head movement, like looking up or bending over. Contact a health care provider if:  Your vertigo gets worse.  You have other symptoms,  including: ? Nausea. ? Vomiting. ? Headache. Get help right away if:  You have vision changes.  You have a severe or worsening headache or neck pain.  You cannot stop vomiting.  You have new numbness or weakness in any part of your body. Summary  Vertigo is the feeling that you or your surroundings are moving when they are not.  The Epley maneuver is an exercise that relieves symptoms of vertigo.  If the Epley maneuver is done correctly, it is considered safe. You can do it up to 3 times a day. This information is not intended to replace advice given to you by your health care provider. Make sure you discuss any questions you have with your health care provider. Document Revised: 12/28/2016 Document Reviewed: 12/06/2015 Elsevier Patient Education  2020 Elsevier Inc.   

## 2019-06-03 ENCOUNTER — Encounter: Payer: Self-pay | Admitting: Family Medicine

## 2019-06-16 ENCOUNTER — Other Ambulatory Visit: Payer: Self-pay

## 2019-06-16 ENCOUNTER — Ambulatory Visit: Payer: No Typology Code available for payment source | Attending: Orthopedic Surgery | Admitting: Physical Therapy

## 2019-06-16 DIAGNOSIS — M25551 Pain in right hip: Secondary | ICD-10-CM | POA: Insufficient documentation

## 2019-06-16 NOTE — Therapy (Signed)
[place] MAIN [facility] SERVICES [address] [place], [place], [address] Phone: [phone]   Fax:  [phone]  Patient Details  Name: Shelby Henson MRN: [id number] Date of Birth: 18-Jan-1971 Referring Provider:  Steele Sizer, MD  Encounter Date: 06/16/2019   PT/OT/SLP Screening Form   Time: in_0902_____     Time out__0945___   Complaint __Right hip pain_________________ Past Medical Hx:  ___s/p R THA (11/12/18)_____________ Injury Date:________________________________  Pain Scale: __5/10__________ Patient's phone number:   Hx (this occurrence):  Reports her hip pain has been better the last few weeks. She has had pain down to 3-4/10 with it rarely over a 4/10; She reports last 3 days she been having catching sensation with difficulty walking and increased limping/antalgic gait.  She does report she has been walking more upstairs which could potentially be contributing to discomfort;  She reports she hasn't gotten her TENs unit yet but needs to follow up on that.  She is still on light duty; She is supposed to follow up with orthopedic surgeon in July 2021;    Assessment:  Patient tested positive with FADIR and FABER test, (-) Scour test on RLE;  PT performed lateral right hip joint glides with mobilization belt 10 sec bouts x3 sets Also performed inferior long axis joint distraction 10 sec hold, 10 sec rest x2-3 min;  Following distraction patient reports less discomfort with FADIR/FABER position but still has discomfort;  Patient prone: PT performed grade III-IV PA mobs to right hip joint with hip in extended position for increased stretch along anterior capsule 15 sec bouts x5 reps;   PT performed passive quad stretch 30 sec hold x3 reps; Patient reports reproduction of symptoms with joint mobs;  Following prone stretch, patient reports minimal pain with FADIR/FABER position and minimal catching with better gait mechanics upon  standing;   Assessed LE strength: RLE grossly 4 to 4+/5 in all muscle groups;   Recommendations:    Comments: Recommend patient increased anterior hip stretches especially following increased activity She has extensive HEP, reinforced how to implement including to stretch daily and to do strengthening 2-3 days a week;     []  Patient would benefit from an MD referral []  Patient would benefit from a full PT/OT/ SLP evaluation and treatment. [x]  No intervention recommended at this time.    Claudius Mich  PT, DPT 06/16/2019, 9:47 AM  [place] MAIN [facility] SERVICES [address] [place], [place], [address] Phone: [phone]   Fax:  [phone]

## 2019-06-23 ENCOUNTER — Telehealth: Payer: Self-pay | Admitting: Family Medicine

## 2019-06-23 DIAGNOSIS — F39 Unspecified mood [affective] disorder: Secondary | ICD-10-CM

## 2019-06-23 DIAGNOSIS — R232 Flushing: Secondary | ICD-10-CM

## 2019-06-23 NOTE — Telephone Encounter (Signed)
Fails lab protocol- Shelby Henson want to add to upcoming visit: 09/08/19

## 2019-06-24 ENCOUNTER — Encounter: Payer: Self-pay | Admitting: Family Medicine

## 2019-06-24 DIAGNOSIS — R232 Flushing: Secondary | ICD-10-CM

## 2019-06-24 DIAGNOSIS — F39 Unspecified mood [affective] disorder: Secondary | ICD-10-CM

## 2019-06-24 MED ORDER — DESVENLAFAXINE SUCCINATE ER 50 MG PO TB24: 50.0000 mg | ORAL_TABLET | Freq: Every day | ORAL | 0 refills | Status: DC

## 2019-07-15 ENCOUNTER — Other Ambulatory Visit: Payer: Self-pay

## 2019-07-15 ENCOUNTER — Ambulatory Visit: Payer: No Typology Code available for payment source | Attending: Orthopedic Surgery | Admitting: Physical Therapy

## 2019-07-15 DIAGNOSIS — M25551 Pain in right hip: Secondary | ICD-10-CM

## 2019-07-15 NOTE — Therapy (Signed)
[place] MAIN [facility] SERVICES [address] [place], [place], [address] Phone: [phone]   Fax:  [phone]  Patient Details  Name: Shelby Henson MRN: [id number] Date of Birth: 23-Jul-1970 Referring Provider:  Steele Sizer, MD  Encounter Date: 07/15/2019  PT/OT/SLP Screening Form   Time: in_9:30_____     Time out_10:00____   Complaint __right hip pain_________________ Past Medical Hx:  _s/p RLE THA (anterior hip)_______________ Injury Date:___October 2020_____________________________  Pain Scale: __Current pain: 4/10, worst: 6/10, best: 3/10__________ Patient's phone number:   Hx (this occurrence):  S/p RLE THA in Oct 2020 reports continued RLE hip pain; She still has numbness along right latera hip;  She is still having pain at night which limits sleeping. She reports she is able to sleep more on her right side but its still bothersome. She reports multiple sleep disturbances (at least every half hour) She is working as a [employer] certified accountant, she is still light duty; She reports running into him at the hospital and he suggested that she start back with PT She reports increased pain with prolonged walking and sitting; She reports she is able to get some relief when lounging on couch with leg elevated; She is still having trouble with ADLs including squatting/lifting;  She is still using ice for pain control;   She reports doing HEP 3x a week; She has not been doing much stretching;    Assessment:  (-) SLR bilaterally, does have tightness with hamstring (+) FADIR and FABER test bilaterally (R hip more painful than left, but does have pain on both sides)  Hip strength: R  L Flexion: 4+/5  4+/5 Extension: 4/5  3+/5 Abduction 4/5  4/5 Adduction 3+/5  3+/5   (-) Thomas test with good hip flexor ROM noted, knee flexion R: 65, L: 55  PT assessed bilateral hip tissues with moderate tenderness reported and increased tightness noted in hip  flexors and along lateral hip bilaterally;   Recommendations:   She has fair to good strength in BLE and does not exhibit significant tightness in RLE compared to LLE.  Patient has had extensive conservative therapy including exercise, manual therapy, dry needling, TENs, etc with no significant pain relief. Recommend patient increase adherence with HEP with LE stretches to 2x a day and obtain a TENs unit for use while at work. Recommend patient do this for 2-3 weeks and then will re-assess. If she is still having pain after a few weeks recommend patient follow up with MD as she has had extensive conservative treatment with minimal relief.  Comments: Patient agreeable;    []  Patient would benefit from an MD referral []  Patient would benefit from a full PT/OT/ SLP evaluation and treatment. [x]  No intervention recommended at this time.   [name]  PT, DPT 07/15/2019, 10:56 AM  [place] MAIN [facility] SERVICES [address] [place], [place], [address] Phone: [phone]   Fax:  [phone]

## 2019-07-17 ENCOUNTER — Other Ambulatory Visit: Payer: Self-pay | Admitting: Pain Medicine

## 2019-08-24 ENCOUNTER — Ambulatory Visit (INDEPENDENT_AMBULATORY_CARE_PROVIDER_SITE_OTHER): Payer: Self-pay | Admitting: Obstetrics & Gynecology

## 2019-08-24 ENCOUNTER — Encounter: Payer: Self-pay | Admitting: Obstetrics & Gynecology

## 2019-08-24 ENCOUNTER — Other Ambulatory Visit: Payer: Self-pay

## 2019-08-24 VITALS — BP 120/80 | Ht 59.0 in | Wt 183.0 lb

## 2019-08-24 DIAGNOSIS — Z1231 Encounter for screening mammogram for malignant neoplasm of breast: Secondary | ICD-10-CM

## 2019-08-24 DIAGNOSIS — Z01419 Encounter for gynecological examination (general) (routine) without abnormal findings: Secondary | ICD-10-CM

## 2019-08-24 DIAGNOSIS — Z1211 Encounter for screening for malignant neoplasm of colon: Secondary | ICD-10-CM

## 2019-08-24 DIAGNOSIS — R232 Flushing: Secondary | ICD-10-CM

## 2019-08-24 MED ORDER — CLONIDINE HCL 0.1 MG PO TABS: 0.1000 mg | ORAL_TABLET | Freq: Every day | ORAL | 11 refills | Status: DC

## 2019-08-24 NOTE — Progress Notes (Signed)
HPI:      [name] is a 49 y.o. [phone] who LMP was in the past s/p [facility], she presents today for her annual examination.  The patient has new complaints today of worsening hot flashes and hot cold episodes, also fatigue, no night sweats. The patient is sexually active, no pain or vag dryness. Herlast pap: approximate date 2020 and was normal and last mammogram: approximate date 2020 and was normal.  The patient does perform self breast exams.  There is no notable family history of breast or ovarian cancer in her family. The patient is not taking hormone replacement therapy. Patient denies post-menopausal vaginal bleeding.   The patient has regular exercise: yes. The patient denies current symptoms of depression.    GYN Hx: Last Colonoscopy:never  PMHx: Past Medical History:  Diagnosis Date  . Anemia   . Asthma    well controlled  . Complication of anesthesia   . Depression   . GERD (gastroesophageal reflux disease)    occ  . Migraine   . Osteoarthritis   . PONV (postoperative nausea and vomiting)    with c-section x 1   Past Surgical History:  Procedure Laterality Date  . ABDOMINAL HYSTERECTOMY    . ABLATION    . BREAST CYST ASPIRATION Left   . CESAREAN SECTION    . CESAREAN SECTION    . CESAREAN SECTION    . HIP SURGERY  10/2018  . LAPAROSCOPY  2018  . LEEP    . TOTAL HIP ARTHROPLASTY Right 11/12/2018   Procedure: TOTAL HIP ARTHROPLASTY ANTERIOR APPROACH;  Surgeon: Lovell Sheehan, MD;  Location: [facility] ORS;  Service: Orthopedics;  Laterality: Right;  . TOTAL VAGINAL HYSTERECTOMY     Family History  Problem Relation Age of Onset  . Bipolar disorder Brother   . Depression Mother   . Breast cancer Neg Hx    Social History   Tobacco Use  . Smoking status: Never Smoker  . Smokeless tobacco: Never Used  Vaping Use  . Vaping Use: Never used  Substance Use Topics  . Alcohol use: Yes    Alcohol/week: 0.0 standard drinks    Comment: occasionally  . Drug use: No     Current Outpatient Medications:  .  albuterol (PROVENTIL) (2.5 MG/3ML) 0.083% nebulizer solution, Take 3 mLs (2.5 mg total) by nebulization every 6 (six) hours as needed for wheezing or shortness of breath., Disp: 75 mL, Rfl: 2 .  APPLE CIDER VINEGAR PO, Take 2 tablets by mouth daily. [place]oli Apple Cider Vinegar Gummy Vitamins, Disp: , Rfl:  .  baclofen (LIORESAL) 10 MG tablet, Take 10 mg by mouth 3 (three) times daily., Disp: , Rfl:  .  budesonide (PULMICORT) 0.5 MG/2ML nebulizer solution, Take 2 mLs (0.5 mg total) by nebulization 2 (two) times daily., Disp: 60 mL, Rfl: 5 .  desvenlafaxine (PRISTIQ) 50 MG 24 hr tablet, Take 1 tablet (50 mg total) by mouth daily., Disp: 90 tablet, Rfl: 0 .  Oxycodone HCl 10 MG TABS, Take 10 mg by mouth 4 (four) times daily as needed., Disp: , Rfl:  .  Semaglutide,0.25 or 0.5MG /DOS, (OZEMPIC, 0.25 OR 0.5 MG/DOSE,) 2 MG/1.5ML SOPN, Inject 0.5 mg into the skin once a week., Disp: 2 pen, Rfl: 2 .  cloNIDine (CATAPRES) 0.1 MG tablet, Take 1 tablet (0.1 mg total) by mouth daily., Disp: 30 tablet, Rfl: 11 Allergies: Celecoxib, Prednisone, Topamax [topiramate], Zolpidem, Codeine, and Penicillins  Review of Systems  Constitutional: Positive for malaise/fatigue. Negative  for chills and fever.  HENT: Negative for congestion, sinus pain and sore throat.   Eyes: Negative for blurred vision and pain.  Respiratory: Negative for cough and wheezing.   Cardiovascular: Negative for chest pain and leg swelling.  Gastrointestinal: Negative for abdominal pain, constipation, diarrhea, heartburn, nausea and vomiting.  Genitourinary: Negative for dysuria, frequency, hematuria and urgency.  Musculoskeletal: Negative for back pain, joint pain, myalgias and neck pain.  Skin: Negative for itching and rash.  Neurological: Negative for dizziness, tremors and weakness.  Endo/Heme/Allergies: Does not bruise/bleed easily.  Psychiatric/Behavioral: Negative for depression. The patient is not  nervous/anxious and does not have insomnia.     Objective: BP 120/80   Ht 4\' 11"  (1.499 m)   Wt 183 lb (83 kg)   BMI 36.96 kg/m   Filed Weights   08/24/19 1107  Weight: 183 lb (83 kg)   Body mass index is 36.96 kg/m. Physical Exam Constitutional:      General: She is not in acute distress.    Appearance: She is well-developed.  Genitourinary:     Pelvic exam was performed with patient supine.     Urethra, bladder, vagina and rectum normal.     No lesions in the vagina.     No vaginal bleeding.     No cervical lesion.     Uterus is absent.     No right or left adnexal mass present.     Right adnexa not tender.     Left adnexa not tender.  HENT:     Head: Normocephalic and atraumatic. No laceration.     Right Ear: Hearing normal.     Left Ear: Hearing normal.     Mouth/Throat:     Pharynx: Uvula midline.  Eyes:     Pupils: Pupils are equal, round, and reactive to light.  Neck:     Thyroid: No thyromegaly.  Cardiovascular:     Rate and Rhythm: Normal rate and regular rhythm.     Heart sounds: No murmur heard.  No friction rub. No gallop.   Pulmonary:     Effort: Pulmonary effort is normal. No respiratory distress.     Breath sounds: Normal breath sounds. No wheezing.  Chest:     Breasts:        Right: No mass, skin change or tenderness.        Left: No mass, skin change or tenderness.  Abdominal:     General: Bowel sounds are normal. There is no distension.     Palpations: Abdomen is soft.     Tenderness: There is no abdominal tenderness. There is no rebound.  Musculoskeletal:        General: Normal range of motion.     Cervical back: Normal range of motion and neck supple.  Neurological:     Mental Status: She is alert and oriented to person, place, and time.     Cranial Nerves: No cranial nerve deficit.  Skin:    General: Skin is warm and dry.  Psychiatric:        Judgment: Judgment normal.  Vitals reviewed.     Assessment: Annual Exam 1. Women's  annual routine gynecological examination   2. Encounter for screening mammogram for malignant neoplasm of breast   3. Vasomotor flushing   4. Screen for colon cancer     Plan:            1.  Cervical Screening-  Pap smear schedule reviewed with patient  2. Breast  screening- Exam annually and mammogram scheduled  3. Colonoscopy every 10 years, Hemoccult testing after age 70  4. Labs managed by PCP  5. Counseling for hormonal therapy: Options discussed ERT vs CLonidine vs other for vasomotor tx, failed Black Cohash mgt (side effects) Clonidine po therapy planned    F/U  Return in about 1 year (around 08/23/2020) for Annual, plus appt TELE 8 weeks follow up.  Barnett Applebaum, MD, [facility], [facility] 08/24/2019  11:42 AM

## 2019-08-24 NOTE — Patient Instructions (Addendum)
PAP every three years Mammogram every year    Call [phone] to schedule at [facility] Colonoscopy every 10 years Labs yearly (with PCP)  Thank you for choosing [facility]. As part of our ongoing efforts to improve patient experience, we would appreciate your feedback. Please fill out the short survey that you will receive by mail or MyChart. Your opinion is important to [place]!  Clonidine tablets What is this medicine? CLONIDINE (KLOE ni deen) is used to treat high blood pressure. This medicine may be used for other purposes; ask your health care provider or pharmacist if you have questions. COMMON BRAND NAME(S): Catapres What should I tell my health care provider before I take this medicine? They need to know if you have any of these conditions:  kidney disease  an unusual or allergic reaction to clonidine, other medicines, foods, dyes, or preservatives  pregnant or trying to get pregnant  breast-feeding How should I use this medicine? Take this medicine by mouth with a glass of water. Follow the directions on the prescription label. Take your doses at regular intervals. Do not take your medicine more often than directed. Do not suddenly stop taking this medicine. You must gradually reduce the dose or you may get a dangerous increase in blood pressure. Ask your doctor or health care professional for advice. Talk to your pediatrician regarding the use of this medicine in children. Special care may be needed. Overdosage: If you think you have taken too much of this medicine contact a poison control center or emergency room at once. NOTE: This medicine is only for you. Do not share this medicine with others. What if I miss a dose? If you miss a dose, take it as soon as you can. If it is almost time for your next dose, take only that dose. Do not take double or extra doses. What may interact with this medicine? Do not take this medicine with any of the following medications:  MAOIs  like Carbex, Eldepryl, Marplan, Nardil, and Parnate This medicine may also interact with the following medications:  barbiturate medicines for inducing sleep or treating seizures like phenobarbital  certain medicines for blood pressure, heart disease, irregular heart beat  certain medicines for depression, anxiety, or psychotic disturbances  prescription pain medicines This list may not describe all possible interactions. Give your health care provider a list of all the medicines, herbs, non-prescription drugs, or dietary supplements you use. Also tell them if you smoke, drink alcohol, or use illegal drugs. Some items may interact with your medicine. What should I watch for while using this medicine? Visit your doctor or health care professional for regular checks on your progress. Check your heart rate and blood pressure regularly while you are taking this medicine. Ask your doctor or health care professional what your heart rate should be and when you should contact him or her. You may get drowsy or dizzy. Do not drive, use machinery, or do anything that needs mental alertness until you know how this medicine affects you. To avoid dizzy or fainting spells, do not stand or sit up quickly, especially if you are an older person. Alcohol can make you more drowsy and dizzy. Avoid alcoholic drinks. Your mouth may get dry. Chewing sugarless gum or sucking hard candy, and drinking plenty of water will help. Do not treat yourself for coughs, colds, or pain while you are taking this medicine without asking your doctor or health care professional for advice. Some ingredients may increase your blood pressure.  If you are going to have surgery tell your doctor or health care professional that you are taking this medicine. What side effects may I notice from receiving this medicine? Side effects that you should report to your doctor or health care professional as soon as possible:  allergic reactions like  skin rash, itching or hives, swelling of the face, lips, or tongue  anxiety, nervousness  chest pain  depression  fast, irregular heartbeat  swelling of feet or legs  unusually weak or tired Side effects that usually do not require medical attention (report to your doctor or health care professional if they continue or are bothersome):  change in sex drive or performance  constipation  headache This list may not describe all possible side effects. Call your doctor for medical advice about side effects. You may report side effects to FDA at 1-800-FDA-1088. Where should I keep my medicine? Keep out of the reach of children. Store at room temperature between 15 and 30 degrees C (59 and 86 degrees F). Protect from light. Keep container tightly closed. Throw away any unused medicine after the expiration date. NOTE: This sheet is a summary. It may not cover all possible information. If you have questions about this medicine, talk to your doctor, pharmacist, or health care provider.  2020 Elsevier/Gold Standard (2010-07-12 13:01:28)

## 2019-09-08 ENCOUNTER — Encounter: Payer: Self-pay | Admitting: Family Medicine

## 2019-09-08 ENCOUNTER — Other Ambulatory Visit: Payer: Self-pay | Admitting: Family Medicine

## 2019-09-08 ENCOUNTER — Ambulatory Visit (INDEPENDENT_AMBULATORY_CARE_PROVIDER_SITE_OTHER): Payer: No Typology Code available for payment source | Admitting: Family Medicine

## 2019-09-08 ENCOUNTER — Other Ambulatory Visit: Payer: Self-pay

## 2019-09-08 VITALS — BP 120/88 | HR 81 | Temp 98.2°F | Resp 16 | Ht 59.0 in | Wt 177.8 lb

## 2019-09-08 DIAGNOSIS — E8881 Metabolic syndrome: Secondary | ICD-10-CM

## 2019-09-08 DIAGNOSIS — M25551 Pain in right hip: Secondary | ICD-10-CM

## 2019-09-08 DIAGNOSIS — F5101 Primary insomnia: Secondary | ICD-10-CM

## 2019-09-08 DIAGNOSIS — J4520 Mild intermittent asthma, uncomplicated: Secondary | ICD-10-CM

## 2019-09-08 DIAGNOSIS — E785 Hyperlipidemia, unspecified: Secondary | ICD-10-CM

## 2019-09-08 DIAGNOSIS — R232 Flushing: Secondary | ICD-10-CM

## 2019-09-08 DIAGNOSIS — Z96641 Presence of right artificial hip joint: Secondary | ICD-10-CM

## 2019-09-08 DIAGNOSIS — E559 Vitamin D deficiency, unspecified: Secondary | ICD-10-CM | POA: Diagnosis not present

## 2019-09-08 DIAGNOSIS — E66812 Obesity, class 2: Secondary | ICD-10-CM

## 2019-09-08 DIAGNOSIS — G8929 Other chronic pain: Secondary | ICD-10-CM

## 2019-09-08 DIAGNOSIS — Z862 Personal history of diseases of the blood and blood-forming organs and certain disorders involving the immune mechanism: Secondary | ICD-10-CM

## 2019-09-08 DIAGNOSIS — E669 Obesity, unspecified: Secondary | ICD-10-CM

## 2019-09-08 DIAGNOSIS — F39 Unspecified mood [affective] disorder: Secondary | ICD-10-CM | POA: Diagnosis not present

## 2019-09-08 DIAGNOSIS — G43009 Migraine without aura, not intractable, without status migrainosus: Secondary | ICD-10-CM

## 2019-09-08 DIAGNOSIS — Z113 Encounter for screening for infections with a predominantly sexual mode of transmission: Secondary | ICD-10-CM

## 2019-09-08 MED ORDER — OZEMPIC (0.25 OR 0.5 MG/DOSE) 2 MG/1.5ML ~~LOC~~ SOPN: 0.5000 mg | PEN_INJECTOR | SUBCUTANEOUS | 2 refills | Status: DC

## 2019-09-08 MED ORDER — PREGABALIN 50 MG PO CAPS: 50.0000 mg | ORAL_CAPSULE | Freq: Three times a day (TID) | ORAL | 0 refills | Status: DC

## 2019-09-08 MED ORDER — DESVENLAFAXINE SUCCINATE ER 50 MG PO TB24: 50.0000 mg | ORAL_TABLET | Freq: Every day | ORAL | 1 refills | Status: DC

## 2019-09-08 NOTE — Progress Notes (Signed)
Name: Shelby Henson   MRN: [id number]    DOB: [birth year]   Date:09/08/2019       Progress Note  Subjective  Chief Complaint  Chief Complaint  Patient presents with  . Medication Management  . Hyperlipidemia  . Migraine  . Gastroesophageal Reflux    HPI    Asthma Mild Intermittent:she has been doing well, no wheezing, coughing or sob. She has not used her neb machine in a long time. She does not respond to rescue inhaler.   Mood swings:she has been on Pritiqfor many years, she has afamily history of bipolar,we tried Depakote but she is not sure why she stopped.She is doing well with new job, likes working at [employer] 9 positive for sleep problems only, doing better emotionally   History of right hip replacement : going to [facility] pain clinic, had surgery done 10/2018, still has daily pain, on modified duty at work and being sedentary has caused weight gain, and she is feeling discouraged about it, they still don't know what is going on with her hip . Taking oxydone daily. Pain is constant at a6/10 right now, she is seeing Dr. Harlow Mares and he suggested Lyrica to be initiated by her pcp. She states she tried gabapentin but it made her moody, we will try a low dose and monitor    Obesity: trying a low carb diet, her weight has gone up from 178 lbs in January 2020 and she it was up to 193 lbs 05/2019  ( her heaviest ever - even during pregnancies) we started her on Ozempic and weight is down to 177.8 lbs. She states Ozempic curbs her appetite and she is happy with the weight loss. She has co-morbidities with BMI above 35 , OA, metabolic syndrome and dyslipidemia.  Dyslipidemia: discussed life style modification , we will recheck labs today   Metabolic syndrome: she denies polyphagia, polydipsia or polyuria. We will recheck labs    Patient Active Problem List   Diagnosis Date Noted  . Pain in right hip 01/12/2019  . Osteonecrosis of right hip ([place]) 11/12/2018  . Vitamin D  deficiency 02/25/2018  . Anemia, unspecified 01/08/2017  . Chronic pain 10/24/2015  . Hot flashes 10/24/2015  . Mood changes 10/24/2015  . Migraine without aura and without status migrainosus, not intractable 10/24/2015  . Metabolic syndrome 08/67/6195  . Dyslipidemia 10/24/2015  . Allergic rhinitis, seasonal 05/17/2015  . Asthma, mild intermittent 08/08/2014  . Chronic insomnia 08/08/2014  . DDD (degenerative disc disease), lumbar 08/08/2014  . Mood disorder ([place]) 08/08/2014  . Gastro-esophageal reflux disease without esophagitis 08/08/2014  . H/O total hysterectomy 08/08/2014  . Irritable bowel syndrome with diarrhea 08/08/2014  . Osteoarthritis of right hip 08/08/2014  . Adult BMI 30+ 08/08/2014  . SBO (spina bifida occulta) 08/08/2014    Past Surgical History:  Procedure Laterality Date  . ABDOMINAL HYSTERECTOMY    . ABLATION    . BREAST CYST ASPIRATION Left   . CESAREAN SECTION    . CESAREAN SECTION    . CESAREAN SECTION    . HIP SURGERY  10/2018  . LAPAROSCOPY  2018  . LEEP    . TOTAL HIP ARTHROPLASTY Right 11/12/2018   Procedure: TOTAL HIP ARTHROPLASTY ANTERIOR APPROACH;  Surgeon: Lovell Sheehan, MD;  Location: [facility] ORS;  Service: Orthopedics;  Laterality: Right;  . TOTAL VAGINAL HYSTERECTOMY      Family History  Problem Relation Age of Onset  . Bipolar disorder Brother   . Depression Mother   .  Breast cancer Neg Hx     Social History   Tobacco Use  . Smoking status: Never Smoker  . Smokeless tobacco: Never Used  Substance Use Topics  . Alcohol use: Yes    Alcohol/week: 0.0 standard drinks    Comment: occasionally     Current Outpatient Medications:  .  albuterol (PROVENTIL) (2.5 MG/3ML) 0.083% nebulizer solution, Take 3 mLs (2.5 mg total) by nebulization every 6 (six) hours as needed for wheezing or shortness of breath., Disp: 75 mL, Rfl: 2 .  baclofen (LIORESAL) 10 MG tablet, Take 10 mg by mouth 3 (three) times daily., Disp: , Rfl:  .  budesonide  (PULMICORT) 0.5 MG/2ML nebulizer solution, Take 2 mLs (0.5 mg total) by nebulization 2 (two) times daily., Disp: 60 mL, Rfl: 5 .  cloNIDine (CATAPRES) 0.1 MG tablet, Take 1 tablet (0.1 mg total) by mouth daily., Disp: 30 tablet, Rfl: 11 .  desvenlafaxine (PRISTIQ) 50 MG 24 hr tablet, Take 1 tablet (50 mg total) by mouth daily., Disp: 90 tablet, Rfl: 0 .  Oxycodone HCl 10 MG TABS, Take 10 mg by mouth 4 (four) times daily as needed., Disp: , Rfl:  .  Semaglutide,0.25 or 0.5MG /DOS, (OZEMPIC, 0.25 OR 0.5 MG/DOSE,) 2 MG/1.5ML SOPN, Inject 0.5 mg into the skin once a week., Disp: 2 pen, Rfl: 2 .  APPLE CIDER VINEGAR PO, Take 2 tablets by mouth daily. [place] Apple Cider Vinegar Gummy Vitamins (Patient not taking: Reported on 09/08/2019), Disp: , Rfl:   Allergies  Allergen Reactions  . Celecoxib Hives  . Prednisone     intolerance, makes her angry. Makes her angry  . Topamax [Topiramate]     Mood changes  . Zolpidem   . Codeine Rash  . Penicillins Rash    Did it involve swelling of the face/tongue/throat, SOB, or low BP? No Did it involve sudden or severe rash/hives, skin peeling, or any reaction on the inside of your mouth or nose? Yes Did you need to seek medical attention at a hospital or doctor's office? Yes When did it last happen? More than 20 years ago If all above answers are "NO", may proceed with cephalosporin use.     I personally reviewed active problem list, medication list, allergies, family history, social history, health maintenance with the patient/caregiver today.   ROS  Constitutional: Negative for fever or weight change.  Respiratory: Negative for cough and shortness of breath.   Cardiovascular: Negative for chest pain or palpitations.  Gastrointestinal: Negative for abdominal pain, no bowel changes.  Musculoskeletal: Positive  for gait problem from right hip pain or joint swelling.  Skin: Negative for rash.  Neurological: Negative for dizziness , positive for  intermittent headache.  No other specific complaints in a complete review of systems (except as listed in HPI above).  Objective  Vitals:   09/08/19 0859  BP: 120/88  Pulse: 81  Resp: 16  Temp: 98.2 F (36.8 C)  TempSrc: Oral  SpO2: 99%  Weight: 177 lb 12.8 oz (80.6 kg)  Height: 4\' 11"  (1.499 m)    Body mass index is 35.91 kg/m.  Physical Exam  Constitutional: Patient appears well-developed and well-nourished. Obese  No distress.  HEENT: head atraumatic, normocephalic, pupils equal and reactive to light, eneck supple Cardiovascular: Normal rate, regular rhythm and normal heart sounds.  No murmur heard. No BLE edema. Pulmonary/Chest: Effort normal and breath sounds normal. No respiratory distress. Abdominal: Soft.  There is no tenderness. Psychiatric: Patient has a normal mood and affect.  behavior is normal. Judgment and thought content normal.   PHQ2/9: Depression screen [facility] 2/9 09/08/2019 05/25/2019 09/04/2018 02/25/2018 09/03/2017  Decreased Interest 0 2 1 0 3  Down, Depressed, Hopeless 0 0 1 0 1  PHQ - 2 Score 0 2 2 0 4  Altered sleeping 1 3 3 3 3   Tired, decreased energy 1 3 3  0 3  Change in appetite 0 0 1 0 0  Feeling bad or failure about yourself  0 0 0 0 0  Trouble concentrating 0 1 0 3 2  Moving slowly or fidgety/restless 0 0 1 0 0  Suicidal thoughts 0 0 0 0 0  PHQ-9 Score 2 9 10 6 12   Difficult doing work/chores Not difficult at all Very difficult Somewhat difficult Somewhat difficult Very difficult    phq 9 is negative   Fall Risk: Fall Risk  09/08/2019 05/25/2019 09/04/2018 02/25/2018 09/03/2017  Falls in the past year? 0 0 0 0 No  Number falls in past yr: 0 0 0 0 -  Injury with Fall? 0 0 0 0 -  Comment - - - - -     Assessment & Plan  1. Mood disorder (HCC)  - COMPLETE METABOLIC PANEL WITH GFR - desvenlafaxine (PRISTIQ) 50 MG 24 hr tablet; Take 1 tablet (50 mg total) by mouth daily.  Dispense: 90 tablet; Refill: 1  2. Vitamin D deficiency  - VITAMIN D  25 Hydroxy (Vit-D Deficiency, Fractures)  3. Metabolic syndrome  - Hemoglobin A1c - Semaglutide,0.25 or 0.5MG /DOS, (OZEMPIC, 0.25 OR 0.5 MG/DOSE,) 2 MG/1.5ML SOPN; Inject 0.375 mLs (0.5 mg total) into the skin once a week.  Dispense: 1.5 mL; Refill: 2  4. Mild intermittent asthma in adult without complication   5. Primary insomnia   6. Dyslipidemia  - Lipid panel  7. Migraine without aura and without status migrainosus, not intractable   8. History of iron deficiency anemia  - CBC with Differential/Platelet - Iron, TIBC and Ferritin Panel  9. History of hip replacement, total, right   10. Morbid obesity ([place])  - Semaglutide,0.25 or 0.5MG /DOS, (OZEMPIC, 0.25 OR 0.5 MG/DOSE,) 2 MG/1.5ML SOPN; Inject 0.375 mLs (0.5 mg total) into the skin once a week.  Dispense: 1.5 mL; Refill: 2  11. Routine screening for STI (sexually transmitted infection)  - RPR - HIV Antibody (routine testing w rflx)  12. Hot flashes  - desvenlafaxine (PRISTIQ) 50 MG 24 hr tablet; Take 1 tablet (50 mg total) by mouth daily.  Dispense: 90 tablet; Refill: 1  13. Obesity, Class II, BMI 35-39.9  - Semaglutide,0.25 or 0.5MG /DOS, (OZEMPIC, 0.25 OR 0.5 MG/DOSE,) 2 MG/1.5ML SOPN; Inject 0.375 mLs (0.5 mg total) into the skin once a week.  Dispense: 1.5 mL; Refill: 2

## 2019-09-09 ENCOUNTER — Encounter: Payer: Self-pay | Admitting: Family Medicine

## 2019-09-09 LAB — CBC WITH DIFFERENTIAL/PLATELET
Absolute Monocytes: 777 cells/uL (ref 200–950)
Basophils Absolute: 63 cells/uL (ref 0–200)
Basophils Relative: 0.6 %
Eosinophils Absolute: 987 cells/uL — ABNORMAL HIGH (ref 15–500)
Eosinophils Relative: 9.4 %
HCT: 38.9 % (ref 35.0–45.0)
Hemoglobin: 12.7 g/dL (ref 11.7–15.5)
Lymphs Abs: 2541 cells/uL (ref 850–3900)
MCH: 27.5 pg (ref 27.0–33.0)
MCHC: 32.6 g/dL (ref 32.0–36.0)
MCV: 84.4 fL (ref 80.0–100.0)
MPV: 11.6 fL (ref 7.5–12.5)
Monocytes Relative: 7.4 %
Neutro Abs: 6132 cells/uL (ref 1500–7800)
Neutrophils Relative %: 58.4 %
Platelets: 268 10*3/uL (ref 140–400)
RBC: 4.61 10*6/uL (ref 3.80–5.10)
RDW: 14.8 % (ref 11.0–15.0)
Total Lymphocyte: 24.2 %
WBC: 10.5 10*3/uL (ref 3.8–10.8)

## 2019-09-09 LAB — COMPLETE METABOLIC PANEL WITH GFR
AG Ratio: 1.3 (calc) (ref 1.0–2.5)
ALT: 24 U/L (ref 6–29)
AST: 17 U/L (ref 10–35)
Albumin: 4.4 g/dL (ref 3.6–5.1)
Alkaline phosphatase (APISO): 102 U/L (ref 37–153)
BUN: 19 mg/dL (ref 7–25)
CO2: 26 mmol/L (ref 20–32)
Calcium: 9.4 mg/dL (ref 8.6–10.4)
Chloride: 103 mmol/L (ref 98–110)
Creat: 0.62 mg/dL (ref 0.50–1.05)
GFR, Est African American: 122 mL/min/{1.73_m2} (ref >=60)
GFR, Est Non African American: 105 mL/min/{1.73_m2} (ref >=60)
Globulin: 3.4 g/dL (calc) (ref 1.9–3.7)
Glucose, Bld: 78 mg/dL (ref 65–99)
Potassium: 3.9 mmol/L (ref 3.5–5.3)
Sodium: 139 mmol/L (ref 135–146)
Total Bilirubin: 0.6 mg/dL (ref 0.2–1.2)
Total Protein: 7.8 g/dL (ref 6.1–8.1)

## 2019-09-09 LAB — HEMOGLOBIN A1C
Hgb A1c MFr Bld: 5.2 % of total Hgb (ref <5.7)
Mean Plasma Glucose: 103 (calc)
eAG (mmol/L): 5.7 (calc)

## 2019-09-09 LAB — VITAMIN D 25 HYDROXY (VIT D DEFICIENCY, FRACTURES): Vit D, 25-Hydroxy: 21 ng/mL — ABNORMAL LOW (ref 30–100)

## 2019-09-09 LAB — LIPID PANEL
Cholesterol: 194 mg/dL (ref <200)
HDL: 52 mg/dL (ref >=50)
LDL Cholesterol (Calc): 120 mg/dL (calc) — ABNORMAL HIGH
Non-HDL Cholesterol (Calc): 142 mg/dL (calc) — ABNORMAL HIGH (ref <130)
Total CHOL/HDL Ratio: 3.7 (calc) (ref <5.0)
Triglycerides: 114 mg/dL (ref <150)

## 2019-09-09 LAB — HIV ANTIBODY (ROUTINE TESTING W REFLEX): HIV 1&2 Ab, 4th Generation: NONREACTIVE

## 2019-09-09 LAB — IRON,TIBC AND FERRITIN PANEL
%SAT: 20 % (calc) (ref 16–45)
Ferritin: 23 ng/mL (ref 16–232)
Iron: 91 ug/dL (ref 45–160)
TIBC: 444 mcg/dL (calc) (ref 250–450)

## 2019-09-09 LAB — RPR: RPR Ser Ql: NONREACTIVE

## 2019-09-15 ENCOUNTER — Other Ambulatory Visit: Payer: Self-pay

## 2019-09-15 ENCOUNTER — Ambulatory Visit
Admission: RE | Admit: 2019-09-15 | Discharge: 2019-09-15 | Disposition: A | Discharge status: Home / Self Care | Payer: No Typology Code available for payment source | Source: Ambulatory Visit | Attending: Obstetrics & Gynecology | Admitting: Obstetrics & Gynecology

## 2019-09-15 DIAGNOSIS — Z1231 Encounter for screening mammogram for malignant neoplasm of breast: Secondary | ICD-10-CM | POA: Insufficient documentation

## 2019-10-10 ENCOUNTER — Encounter: Payer: Self-pay | Admitting: Family Medicine

## 2019-10-19 ENCOUNTER — Encounter: Payer: Self-pay | Admitting: Obstetrics & Gynecology

## 2019-10-19 ENCOUNTER — Other Ambulatory Visit: Payer: Self-pay

## 2019-10-19 ENCOUNTER — Ambulatory Visit (INDEPENDENT_AMBULATORY_CARE_PROVIDER_SITE_OTHER): Payer: No Typology Code available for payment source | Admitting: Obstetrics & Gynecology

## 2019-10-19 VITALS — Ht 59.0 in

## 2019-10-19 DIAGNOSIS — R232 Flushing: Secondary | ICD-10-CM | POA: Diagnosis not present

## 2019-10-19 NOTE — Progress Notes (Signed)
Virtual Visit via Telephone Note  I connected with Shelby Henson on 10/19/19 at  9:00 AM EDT by telephone and verified that I am speaking with the correct person using two identifiers.   I discussed the limitations, risks, security and privacy concerns of performing an evaluation and management service by telephone and the availability of in person appointments. I also discussed with the patient that there may be a patient responsible charge related to this service. The patient expressed understanding and agreed to proceed. She was at home and I was in my office.  History of Present Illness: Shelby Henson is a 49 y.o. who was started on Clonidine 0.1mg  daily therapy  approximately 2 months ago. Since that time, she states that her symptoms are improving.  Rare hot flashes.  No neg side effects.  PMHx: She  has a past medical history of Anemia, Asthma, Complication of anesthesia, Depression, GERD (gastroesophageal reflux disease), Migraine, Osteoarthritis, and PONV (postoperative nausea and vomiting). Also,  has a past surgical history that includes Abdominal hysterectomy; Cesarean section; Cesarean section; Ablation; LEEP; Total vaginal hysterectomy; laparoscopy (2018); Breast cyst aspiration (Left); Cesarean section; Total hip arthroplasty (Right, 11/12/2018); and Hip surgery (10/2018)., family history includes Bipolar disorder in her brother; Depression in her mother.,  reports that she has never smoked. She has never used smokeless tobacco. She reports current alcohol use. She reports that she does not use drugs. Current Meds  Medication Sig  . albuterol (PROVENTIL) (2.5 MG/3ML) 0.083% nebulizer solution Take 3 mLs (2.5 mg total) by nebulization every 6 (six) hours as needed for wheezing or shortness of breath.  . baclofen (LIORESAL) 10 MG tablet Take 10 mg by mouth 3 (three) times daily.  . budesonide (PULMICORT) 0.5 MG/2ML nebulizer solution Take 2 mLs (0.5 mg total) by nebulization 2 (two)  times daily.  . cloNIDine (CATAPRES) 0.1 MG tablet Take 1 tablet (0.1 mg total) by mouth daily.  Marland Kitchen desvenlafaxine (PRISTIQ) 50 MG 24 hr tablet Take 1 tablet (50 mg total) by mouth daily.  . pregabalin (LYRICA) 50 MG capsule Take 1 capsule (50 mg total) by mouth 3 (three) times daily.  . Semaglutide,0.25 or 0.5MG /DOS, (OZEMPIC, 0.25 OR 0.5 MG/DOSE,) 2 MG/1.5ML SOPN Inject 0.375 mLs (0.5 mg total) into the skin once a week.  . [DISCONTINUED] Oxycodone HCl 10 MG TABS Take 10 mg by mouth 4 (four) times daily as needed.  . Also, is allergic to celecoxib, prednisone, topamax [topiramate], zolpidem, codeine, and penicillins..  Review of Systems  All other systems reviewed and are negative.      Observations/Objective: No exam today, due to telephone eVisit due to [facility] virus restriction on elective visits and procedures.  Prior visits reviewed along with ultrasounds/labs as indicated.  Assessment and Plan:   ICD-10-CM   1. Vasomotor flushing  R23.2   Cont Clonidine for sx control, doing well  Follow Up Instructions: Annual, and PRN   I discussed the assessment and treatment plan with the patient. The patient was provided an opportunity to ask questions and all were answered. The patient agreed with the plan and demonstrated an understanding of the instructions.   The patient was advised to call back or seek an in-person evaluation if the symptoms worsen or if the condition fails to improve as anticipated.  I provided 15 minutes of non-face-to-face time during this encounter.   Hoyt Koch, MD

## 2019-10-26 ENCOUNTER — Other Ambulatory Visit: Payer: Self-pay | Admitting: Family Medicine

## 2019-10-26 DIAGNOSIS — J4521 Mild intermittent asthma with (acute) exacerbation: Secondary | ICD-10-CM

## 2019-10-26 DIAGNOSIS — J4520 Mild intermittent asthma, uncomplicated: Secondary | ICD-10-CM

## 2019-11-10 NOTE — Progress Notes (Signed)
Name: Shelby Henson   MRN: [id number]    DOB: 02-23-1970   Date:11/11/2019       Progress Note  Subjective  Chief Complaint  Chief Complaint  Patient presents with  . Asthma  . Migraine    HPI   Chest tightness: she has a history of asthma, and was well controlled, however 6 weeks ago noticed chest pressure ( like something is sitting on her chest) started suddenly, constant 2-3/10, she resumed albuterol treatment multiple times a day without improvement. She denies wheezing or cough. She had cold symptoms prior to symptoms, she was tested for COVID-19 at the time and it was negative. She states no aggravating or alleviating her symptoms.   Migraines: she was asymptomatic for a long time, however about a month ago she had a severe episodes. Frontal headache described as throbbing, had phonophobia and nausea, but no vomiting. She took Excedrin migraine but the headache lasted 4 days.    Patient Active Problem List   Diagnosis Date Noted  . Pain in right hip 01/12/2019  . Osteonecrosis of right hip ([place]) 11/12/2018  . Vitamin D deficiency 02/25/2018  . Anemia, unspecified 01/08/2017  . Chronic pain 10/24/2015  . Hot flashes 10/24/2015  . Mood changes 10/24/2015  . Migraine without aura and without status migrainosus, not intractable 10/24/2015  . Metabolic syndrome 46/50/3546  . Dyslipidemia 10/24/2015  . Allergic rhinitis, seasonal 05/17/2015  . Asthma, mild intermittent 08/08/2014  . Chronic insomnia 08/08/2014  . DDD (degenerative disc disease), lumbar 08/08/2014  . Mood disorder ([place]) 08/08/2014  . Gastro-esophageal reflux disease without esophagitis 08/08/2014  . H/O total hysterectomy 08/08/2014  . Irritable bowel syndrome with diarrhea 08/08/2014  . Osteoarthritis of right hip 08/08/2014  . Adult BMI 30+ 08/08/2014  . SBO (spina bifida occulta) 08/08/2014    Past Surgical History:  Procedure Laterality Date  . ABDOMINAL HYSTERECTOMY    . ABLATION    . BREAST CYST  ASPIRATION Left   . CESAREAN SECTION    . CESAREAN SECTION    . CESAREAN SECTION    . HIP SURGERY  10/2018  . LAPAROSCOPY  2018  . LEEP    . TOTAL HIP ARTHROPLASTY Right 11/12/2018   Procedure: TOTAL HIP ARTHROPLASTY ANTERIOR APPROACH;  Surgeon: Lovell Sheehan, MD;  Location: [facility] ORS;  Service: Orthopedics;  Laterality: Right;  . TOTAL VAGINAL HYSTERECTOMY      Family History  Problem Relation Age of Onset  . Bipolar disorder Brother   . Depression Mother   . Breast cancer Neg Hx     Social History   Tobacco Use  . Smoking status: Never Smoker  . Smokeless tobacco: Never Used  Substance Use Topics  . Alcohol use: Yes    Alcohol/week: 0.0 standard drinks    Comment: occasionally     Current Outpatient Medications:  .  albuterol (PROVENTIL) (2.5 MG/3ML) 0.083% nebulizer solution, USE ONE VIAL VIA NEBULIZER EVERY 6 HOURS AS NEEDED FOR WHEEZING OR SHORTNESS OF BREATH., Disp: 75 mL, Rfl: 2 .  budesonide (PULMICORT) 0.5 MG/2ML nebulizer solution, TAKE 2 MLS BY NEBULIZATION 2 TIMES DAILY., Disp: 60 mL, Rfl: 5 .  cloNIDine (CATAPRES) 0.1 MG tablet, Take 1 tablet (0.1 mg total) by mouth daily., Disp: 30 tablet, Rfl: 11 .  desvenlafaxine (PRISTIQ) 50 MG 24 hr tablet, Take 1 tablet (50 mg total) by mouth daily., Disp: 90 tablet, Rfl: 1 .  Semaglutide,0.25 or 0.5MG /DOS, (OZEMPIC, 0.25 OR 0.5 MG/DOSE,) 2 MG/1.5ML SOPN, Inject 0.375  mLs (0.5 mg total) into the skin once a week., Disp: 1.5 mL, Rfl: 2 .  baclofen (LIORESAL) 10 MG tablet, Take 10 mg by mouth 3 (three) times daily.  (Patient not taking: Reported on 11/11/2019), Disp: , Rfl:  .  metaxalone (SKELAXIN) 800 MG tablet, Take 800 mg by mouth 3 (three) times daily.  (Patient not taking: Reported on 11/11/2019), Disp: , Rfl:  .  Oxycodone HCl 10 MG TABS, Take 10 mg by mouth 3 (three) times daily as needed.  (Patient not taking: Reported on 11/11/2019), Disp: , Rfl:  .  pregabalin (LYRICA) 50 MG capsule, Take 1 capsule (50 mg total) by  mouth 3 (three) times daily. (Patient not taking: Reported on 11/11/2019), Disp: 90 capsule, Rfl: 0  Allergies  Allergen Reactions  . Celecoxib Hives  . Prednisone     intolerance, makes her angry. Makes her angry  . Topamax [Topiramate]     Mood changes  . Zolpidem   . Codeine Rash  . Penicillins Rash    Did it involve swelling of the face/tongue/throat, SOB, or low BP? No Did it involve sudden or severe rash/hives, skin peeling, or any reaction on the inside of your mouth or nose? Yes Did you need to seek medical attention at a hospital or doctor's office? Yes When did it last happen? More than 20 years ago If all above answers are "NO", may proceed with cephalosporin use.     I personally reviewed active problem list, medication list, allergies, family history, social history with the patient/caregiver today.   ROS  Constitutional: Negative for fever or weight change.  Respiratory: Negative for cough and shortness of breath.   Cardiovascular: positive for chest pain but no  palpitations.  Gastrointestinal: Negative for abdominal pain, no bowel changes.  Musculoskeletal: positive for gait problem but no  joint swelling.  Skin: Negative for rash.  Neurological: Negative for dizziness , positive for  headache.  No other specific complaints in a complete review of systems (except as listed in HPI above).  Objective  Vitals:   11/11/19 1105  BP: 116/70  Pulse: 89  Resp: 18  Temp: 98.2 F (36.8 C)  TempSrc: Oral  SpO2: 99%  Weight: 176 lb 9.6 oz (80.1 kg)  Height: 4\' 11"  (1.499 m)    Body mass index is 35.67 kg/m.  Physical Exam  Constitutional: Patient appears well-developed and well-nourished. Obese  No distress.  HEENT: head atraumatic, normocephalic, pupils equal and reactive to light,  neck supple Cardiovascular: Normal rate, regular rhythm and normal heart sounds.  No murmur heard. No BLE edema. Pulmonary/Chest: Effort normal and breath sounds  normal. No respiratory distress. Abdominal: Soft.  There is no tenderness. Psychiatric: Patient has a normal mood and affect. behavior is normal. Judgment and thought content normal.  Recent Results (from the past 2160 hour(s))  Lipid panel     Status: Abnormal   Collection Time: 09/08/19  9:55 AM  Result Value Ref Range   Cholesterol 194 <200 mg/dL   HDL 52 > OR = 50 mg/dL   Triglycerides 114 <150 mg/dL   LDL Cholesterol (Calc) 120 (H) mg/dL (calc)    Comment: Reference range: <100 . Desirable range <100 mg/dL for primary prevention;   <70 mg/dL for patients with CHD or diabetic patients  with > or = 2 CHD risk factors. Marland Kitchen LDL-C is now calculated using the Martin-Hopkins  calculation, which is a validated novel method providing  better accuracy than the Friedewald equation in the  estimation of LDL-C.  Cresenciano Genre et al. Annamaria Helling. 6269;485(46): 2061-2068  (http://education.[facility].com/faq/FAQ164)    Total CHOL/HDL Ratio 3.7 <5.0 (calc)   Non-HDL Cholesterol (Calc) 142 (H) <130 mg/dL (calc)    Comment: For patients with diabetes plus 1 major ASCVD risk  factor, treating to a non-HDL-C goal of <100 mg/dL  (LDL-C of <70 mg/dL) is considered a therapeutic  option.   CBC with Differential/Platelet     Status: Abnormal   Collection Time: 09/08/19  9:55 AM  Result Value Ref Range   WBC 10.5 3.8 - 10.8 Thousand/uL   RBC 4.61 3.80 - 5.10 Million/uL   Hemoglobin 12.7 11.7 - 15.5 g/dL   HCT 38.9 35 - 45 %   MCV 84.4 80.0 - 100.0 fL   MCH 27.5 27.0 - 33.0 pg   MCHC 32.6 32.0 - 36.0 g/dL   RDW 14.8 11.0 - 15.0 %   Platelets 268 140 - 400 Thousand/uL   MPV 11.6 7.5 - 12.5 fL   Neutro Abs 6,132 1,500 - 7,800 cells/uL   Lymphs Abs 2,541 850 - 3,900 cells/uL   Absolute Monocytes 777 200 - 950 cells/uL   Eosinophils Absolute 987 (H) 15.0 - 500.0 cells/uL   Basophils Absolute 63 0.0 - 200.0 cells/uL   Neutrophils Relative % 58.4 %   Total Lymphocyte 24.2 %   Monocytes Relative 7.4  %   Eosinophils Relative 9.4 %   Basophils Relative 0.6 %  COMPLETE METABOLIC PANEL WITH GFR     Status: None   Collection Time: 09/08/19  9:55 AM  Result Value Ref Range   Glucose, Bld 78 65 - 99 mg/dL    Comment: .            Fasting reference interval .    BUN 19 7 - 25 mg/dL   Creat 0.62 0.50 - 1.05 mg/dL    Comment: For patients >41 years of age, the reference limit for Creatinine is approximately 13% higher for people identified as African-American. .    GFR, Est Non African American 105 > OR = 60 mL/min/1.71m2   GFR, Est African American 122 > OR = 60 mL/min/1.31m2   BUN/Creatinine Ratio NOT APPLICABLE 6 - 22 (calc)   Sodium 139 135 - 146 mmol/L   Potassium 3.9 3.5 - 5.3 mmol/L   Chloride 103 98 - 110 mmol/L   CO2 26 20 - 32 mmol/L   Calcium 9.4 8.6 - 10.4 mg/dL   Total Protein 7.8 6.1 - 8.1 g/dL   Albumin 4.4 3.6 - 5.1 g/dL   Globulin 3.4 1.9 - 3.7 g/dL (calc)   AG Ratio 1.3 1.0 - 2.5 (calc)   Total Bilirubin 0.6 0.2 - 1.2 mg/dL   Alkaline phosphatase (APISO) 102 37 - 153 U/L   AST 17 10 - 35 U/L   ALT 24 6 - 29 U/L  Hemoglobin A1c     Status: None   Collection Time: 09/08/19  9:55 AM  Result Value Ref Range   Hgb A1c MFr Bld 5.2 <5.7 % of total Hgb    Comment: For the purpose of screening for the presence of diabetes: . <5.7%       Consistent with the absence of diabetes 5.7-6.4%    Consistent with increased risk for diabetes             (prediabetes) > or =6.5%  Consistent with diabetes . This assay result is consistent with a decreased risk of diabetes. . Currently, no consensus exists regarding  use of hemoglobin A1c for diagnosis of diabetes in children. . According to American Diabetes Association (ADA) guidelines, hemoglobin A1c <7.0% represents optimal control in non-pregnant diabetic patients. Different metrics may apply to specific patient populations.  Standards of Medical Care in Diabetes(ADA). .    Mean Plasma Glucose 103 (calc)   eAG  (mmol/L) 5.7 (calc)  VITAMIN D 25 Hydroxy (Vit-D Deficiency, Fractures)     Status: Abnormal   Collection Time: 09/08/19  9:55 AM  Result Value Ref Range   Vit D, 25-Hydroxy 21 (L) 30 - 100 ng/mL    Comment: Vitamin D Status         25-OH Vitamin D: . Deficiency:                    <20 ng/mL Insufficiency:             20 - 29 ng/mL Optimal:                 > or = 30 ng/mL . For 25-OH Vitamin D testing on patients on  D2-supplementation and patients for whom quantitation  of D2 and D3 fractions is required, the [facility]ureD(TM) 25-OH VIT D, (D2,D3), LC/MS/MS is recommended: order  code [phone] (patients >25yrs). See Note 1 . Note 1 . For additional information, please refer to  http://education.[facility].com/faq/FAQ199  (This link is being provided for informational/ educational purposes only.)   RPR     Status: None   Collection Time: 09/08/19  9:55 AM  Result Value Ref Range   RPR Ser Ql NON-REACTIVE NON-REACTI  HIV Antibody (routine testing w rflx)     Status: None   Collection Time: 09/08/19  9:55 AM  Result Value Ref Range   HIV 1&2 Ab, 4th Generation NON-REACTIVE NON-REACTI    Comment: HIV-1 antigen and HIV-1/HIV-2 antibodies were not detected. There is no laboratory evidence of HIV infection. Marland Kitchen PLEASE NOTE: This information has been disclosed to you from records whose confidentiality may be protected by state law.  If your state requires such protection, then the state law prohibits you from making any further disclosure of the information without the specific written consent of the person to whom it pertains, or as otherwise permitted by law. A general authorization for the release of medical or other information is NOT sufficient for this purpose. . For additional information please refer to http://education.[facility].com/faq/FAQ106 (This link is being provided for informational/ educational purposes only.) . Marland Kitchen The performance of this assay has  not been clinically validated in patients less than 75 years old. .   Iron, TIBC and Ferritin Panel     Status: None   Collection Time: 09/08/19  9:55 AM  Result Value Ref Range   Iron 91 45 - 160 mcg/dL   TIBC 444 250 - 450 mcg/dL (calc)   %SAT 20 16 - 45 % (calc)   Ferritin 23 16 - 232 ng/mL      PHQ2/9: Depression screen [facility] 2/9 11/11/2019 09/08/2019 05/25/2019 09/04/2018 02/25/2018  Decreased Interest 3 0 2 1 0  Down, Depressed, Hopeless 0 0 0 1 0  PHQ - 2 Score 3 0 2 2 0  Altered sleeping 0 1 3 3 3   Tired, decreased energy 2 1 3 3  0  Change in appetite 0 0 0 1 0  Feeling bad or failure about yourself  0 0 0 0 0  Trouble concentrating 0 0 1 0 3  Moving slowly or fidgety/restless 0 0 0 1 0  Suicidal thoughts 0 0 0 0 0  PHQ-9 Score 5 2 9 10 6   Difficult doing work/chores Very difficult Not difficult at all Very difficult Somewhat difficult Somewhat difficult    phq 9 is negative   Fall Risk: Fall Risk  11/11/2019 09/08/2019 05/25/2019 09/04/2018 02/25/2018  Falls in the past year? 0 0 0 0 0  Number falls in past yr: 0 0 0 0 0  Injury with Fall? 0 0 0 0 0  Comment - - - - -     Functional Status Survey: Is the patient deaf or have difficulty hearing?: No Does the patient have difficulty seeing, even when wearing glasses/contacts?: No Does the patient have difficulty concentrating, remembering, or making decisions?: No Does the patient have difficulty walking or climbing stairs?: Yes Does the patient have difficulty dressing or bathing?: No Does the patient have difficulty doing errands alone such as visiting a doctor's office or shopping?: No    Assessment & Plan  1. Chest tightness  - EKG 12-Lead  She is already taking meloxicam and we will add Colchicine for possible pericarditis   2. Migraine without aura and without status migrainosus, not intractable  - SUMAtriptan (IMITREX) 100 MG tablet; Take 1 tablet (100 mg total) by mouth every 2 (two) hours as needed for  migraine. May repeat in 2 hours if headache persists or recurs.  Dispense: 10 tablet; Refill: 0

## 2019-11-11 ENCOUNTER — Ambulatory Visit (INDEPENDENT_AMBULATORY_CARE_PROVIDER_SITE_OTHER): Payer: No Typology Code available for payment source | Admitting: Family Medicine

## 2019-11-11 ENCOUNTER — Encounter: Payer: Self-pay | Admitting: Family Medicine

## 2019-11-11 ENCOUNTER — Other Ambulatory Visit: Payer: Self-pay

## 2019-11-11 ENCOUNTER — Other Ambulatory Visit: Payer: Self-pay | Admitting: Family Medicine

## 2019-11-11 VITALS — BP 116/70 | HR 89 | Temp 98.2°F | Resp 18 | Ht 59.0 in | Wt 176.6 lb

## 2019-11-11 DIAGNOSIS — G43009 Migraine without aura, not intractable, without status migrainosus: Secondary | ICD-10-CM | POA: Diagnosis not present

## 2019-11-11 DIAGNOSIS — G8929 Other chronic pain: Secondary | ICD-10-CM

## 2019-11-11 DIAGNOSIS — Z96641 Presence of right artificial hip joint: Secondary | ICD-10-CM | POA: Diagnosis not present

## 2019-11-11 DIAGNOSIS — M25551 Pain in right hip: Secondary | ICD-10-CM | POA: Diagnosis not present

## 2019-11-11 DIAGNOSIS — R0789 Other chest pain: Secondary | ICD-10-CM

## 2019-11-11 MED ORDER — COLCHICINE 0.6 MG PO TABS: 0.6000 mg | ORAL_TABLET | Freq: Every day | ORAL | 1 refills | Status: DC

## 2019-11-11 MED ORDER — SUMATRIPTAN SUCCINATE 100 MG PO TABS: 100.0000 mg | ORAL_TABLET | ORAL | 0 refills | Status: DC | PRN

## 2019-11-11 NOTE — Patient Instructions (Signed)
Asthma, Adult  Asthma is a long-term (chronic) condition in which the airways get tight and narrow. The airways are the breathing passages that lead from the nose and mouth down into the lungs. A person with asthma will have times when symptoms get worse. These are called asthma attacks. They can cause coughing, whistling sounds when you breathe (wheezing), shortness of breath, and chest pain. They can make it hard to breathe. There is no cure for asthma, but medicines and lifestyle changes can help control it. There are many things that can bring on an asthma attack or make asthma symptoms worse (triggers). Common triggers include:  Mold.  Dust.  Cigarette smoke.  Cockroaches.  Things that can cause allergy symptoms (allergens). These include animal skin flakes (dander) and pollen from trees or grass.  Things that pollute the air. These may include household cleaners, wood smoke, smog, or chemical odors.  Cold air, weather changes, and wind.  Crying or laughing hard.  Stress.  Certain medicines or drugs.  Certain foods such as dried fruit, potato chips, and grape juice.  Infections, such as a cold or the flu.  Certain medical conditions or diseases.  Exercise or tiring activities. Asthma may be treated with medicines and by staying away from the things that cause asthma attacks. Types of medicines may include:  Controller medicines. These help prevent asthma symptoms. They are usually taken every day.  Fast-acting reliever or rescue medicines. These quickly relieve asthma symptoms. They are used as needed and provide short-term relief.  Allergy medicines if your attacks are brought on by allergens.  Medicines to help control the body's defense (immune) system. Follow these instructions at home: Avoiding triggers in your home  Change your heating and air conditioning filter often.  Limit your use of fireplaces and wood stoves.  Get rid of pests (such as roaches and  mice) and their droppings.  Throw away plants if you see mold on them.  Clean your floors. Dust regularly. Use cleaning products that do not smell.  Have someone vacuum when you are not home. Use a vacuum cleaner with a HEPA filter if possible.  Replace carpet with wood, tile, or vinyl flooring. Carpet can trap animal skin flakes and dust.  Use allergy-proof pillows, mattress covers, and box spring covers.  Wash bed sheets and blankets every week in hot water. Dry them in a dryer.  Keep your bedroom free of any triggers.  Avoid pets and keep windows closed when things that cause allergy symptoms are in the air.  Use blankets that are made of polyester or cotton.  Clean bathrooms and kitchens with bleach. If possible, have someone repaint the walls in these rooms with mold-resistant paint. Keep out of the rooms that are being cleaned and painted.  Wash your hands often with soap and water. If soap and water are not available, use hand sanitizer.  Do not allow anyone to smoke in your home. General instructions  Take over-the-counter and prescription medicines only as told by your doctor. ? Talk with your doctor if you have questions about how or when to take your medicines. ? Make note if you need to use your medicines more often than usual.  Do not use any products that contain nicotine or tobacco, such as cigarettes and e-cigarettes. If you need help quitting, ask your doctor.  Stay away from secondhand smoke.  Avoid doing things outdoors when allergen counts are high and when air quality is low.  Wear a ski mask   when doing outdoor activities in the winter. The mask should cover your nose and mouth. Exercise indoors on cold days if you can.  Warm up before you exercise. Take time to cool down after exercise.  Use a peak flow meter as told by your doctor. A peak flow meter is a tool that measures how well the lungs are working.  Keep track of the peak flow meter's readings.  Write them down.  Follow your asthma action plan. This is a written plan for taking care of your asthma and treating your attacks.  Make sure you get all the shots (vaccines) that your doctor recommends. Ask your doctor about a flu shot and a pneumonia shot.  Keep all follow-up visits as told by your doctor. This is important. Contact a doctor if:  You have wheezing, shortness of breath, or a cough even while taking medicine to prevent attacks.  The mucus you cough up (sputum) is thicker than usual.  The mucus you cough up changes from clear or white to yellow, green, gray, or bloody.  You have problems from the medicine you are taking, such as: ? A rash. ? Itching. ? Swelling. ? Trouble breathing.  You need reliever medicines more than 2-3 times a week.  Your peak flow reading is still at 50-79% of your personal best after following the action plan for 1 hour.  You have a fever. Get help right away if:  You seem to be worse and are not responding to medicine during an asthma attack.  You are short of breath even at rest.  You get short of breath when doing very little activity.  You have trouble eating, drinking, or talking.  You have chest pain or tightness.  You have a fast heartbeat.  Your lips or fingernails start to turn blue.  You are light-headed or dizzy, or you faint.  Your peak flow is less than 50% of your personal best.  You feel too tired to breathe normally. Summary  Asthma is a long-term (chronic) condition in which the airways get tight and narrow. An asthma attack can make it hard to breathe.  Asthma cannot be cured, but medicines and lifestyle changes can help control it.  Make sure you understand how to avoid triggers and how and when to use your medicines. This information is not intended to replace advice given to you by your health care provider. Make sure you discuss any questions you have with your health care provider. Document Revised:  03/20/2018 Document Reviewed: 02/20/2016 Elsevier Patient Education  2020 Elsevier Inc.  

## 2019-11-12 ENCOUNTER — Encounter: Payer: Self-pay | Admitting: Family Medicine

## 2019-11-18 ENCOUNTER — Encounter: Payer: Self-pay | Admitting: Family Medicine

## 2019-11-18 ENCOUNTER — Other Ambulatory Visit: Payer: Self-pay | Admitting: Family Medicine

## 2019-11-18 MED ORDER — OMEPRAZOLE 40 MG PO CPDR: 40.0000 mg | DELAYED_RELEASE_CAPSULE | Freq: Every day | ORAL | 0 refills | Status: DC

## 2019-11-19 ENCOUNTER — Other Ambulatory Visit: Payer: Self-pay | Admitting: Orthopedic Surgery

## 2019-11-19 DIAGNOSIS — Z96641 Presence of right artificial hip joint: Secondary | ICD-10-CM

## 2019-11-30 ENCOUNTER — Ambulatory Visit: Payer: No Typology Code available for payment source

## 2019-11-30 ENCOUNTER — Encounter: Payer: Self-pay | Admitting: Physician Assistant

## 2019-11-30 ENCOUNTER — Other Ambulatory Visit: Payer: Self-pay | Admitting: Physician Assistant

## 2019-11-30 ENCOUNTER — Ambulatory Visit: Payer: No Typology Code available for payment source | Attending: Orthopedic Surgery

## 2019-11-30 ENCOUNTER — Telehealth: Payer: Self-pay | Admitting: Physician Assistant

## 2019-11-30 DIAGNOSIS — J4521 Mild intermittent asthma with (acute) exacerbation: Secondary | ICD-10-CM

## 2019-11-30 DIAGNOSIS — U071 COVID-19: Secondary | ICD-10-CM

## 2019-11-30 NOTE — Telephone Encounter (Signed)
Called to discuss with patient about Covid symptoms and the use of sotrovimab, bamlanivimab/etesevimab or casirivimab/imdevimab, a monoclonal antibody infusion for those with mild to moderate Covid symptoms and at a high risk of hospitalization.  Pt is qualified for this infusion at the [place] [facility] center due to; Specific high risk criteria : BMI > 25. Pt is a Equities trader.    Message left to call back our hotline [phone] and sent mychart message.   Angelena Form PA-C  [facility]

## 2019-11-30 NOTE — Progress Notes (Signed)
I connected by phone with Shelby Henson on 11/30/2019 at 9:03 AM to discuss the potential use of a new treatment for mild to moderate COVID-19 viral infection in non-hospitalized patients.  This patient is a 49 y.o. female that meets the FDA criteria for Emergency Use Authorization of COVID monoclonal antibody casirivimab/imdevimab or bamlamivimab/estevimab.  Has a (+) direct SARS-CoV-2 viral test result  Has mild or moderate COVID-19   Is NOT hospitalized due to COVID-19  Is within 10 days of symptom onset  Has at least one of the high risk factor(s) for progression to severe COVID-19 and/or hospitalization as defined in EUA.  Specific high risk criteria : BMI > 25 and Chronic Lung Disease   I have spoken and communicated the following to the patient or parent/caregiver regarding COVID monoclonal antibody treatment:  1. FDA has authorized the emergency use for the treatment of mild to moderate COVID-19 in adults and pediatric patients with positive results of direct SARS-CoV-2 viral testing who are 34 years of age and older weighing at least 40 kg, and who are at high risk for progressing to severe COVID-19 and/or hospitalization.  2. The significant known and potential risks and benefits of COVID monoclonal antibody, and the extent to which such potential risks and benefits are unknown.  3. Information on available alternative treatments and the risks and benefits of those alternatives, including clinical trials.  4. Patients treated with COVID monoclonal antibody should continue to self-isolate and use infection control measures (e.g., wear mask, isolate, social distance, avoid sharing personal items, clean and disinfect "high touch" surfaces, and frequent handwashing) according to CDC guidelines.   5. The patient or parent/caregiver has the option to accept or refuse COVID monoclonal antibody treatment.  After reviewing this information with the patient, the patient has agreed to  receive one of the available covid 19 monoclonal antibodies and will be provided an appropriate fact sheet prior to infusion.  Sx onset 10/28. Set up for infusion on 11/2 @ 11:30am. Directions given to [facility]. Pt is aware that insurance will be charged an infusion fee. Pt is fully vaccinated with [employer].   Angelena Form 11/30/2019 9:03 AM

## 2019-12-01 ENCOUNTER — Ambulatory Visit (HOSPITAL_COMMUNITY)
Admission: RE | Admit: 2019-12-01 | Discharge: 2019-12-01 | Disposition: A | Discharge status: Home / Self Care | Payer: No Typology Code available for payment source | Source: Ambulatory Visit | Attending: Pulmonary Disease | Admitting: Pulmonary Disease

## 2019-12-01 DIAGNOSIS — J4521 Mild intermittent asthma with (acute) exacerbation: Secondary | ICD-10-CM | POA: Diagnosis not present

## 2019-12-01 DIAGNOSIS — U071 COVID-19: Secondary | ICD-10-CM | POA: Diagnosis present

## 2019-12-01 MED ORDER — EPINEPHRINE 0.3 MG/0.3ML IJ SOAJ: 0.3000 mg | Freq: Once | INTRAMUSCULAR | Status: DC | PRN

## 2019-12-01 MED ORDER — SOTROVIMAB 500 MG/8ML IV SOLN
500.0000 mg | Freq: Once | INTRAVENOUS | Status: AC
  Administered 2019-12-01: 500 mg via INTRAVENOUS

## 2019-12-01 MED ORDER — ALBUTEROL SULFATE HFA 108 (90 BASE) MCG/ACT IN AERS: 2.0000 | INHALATION_SPRAY | Freq: Once | RESPIRATORY_TRACT | Status: DC | PRN

## 2019-12-01 MED ORDER — METHYLPREDNISOLONE SODIUM SUCC 125 MG IJ SOLR: 125.0000 mg | Freq: Once | INTRAMUSCULAR | Status: DC | PRN

## 2019-12-01 MED ORDER — DIPHENHYDRAMINE HCL 50 MG/ML IJ SOLN: 50.0000 mg | Freq: Once | INTRAMUSCULAR | Status: DC | PRN

## 2019-12-01 MED ORDER — FAMOTIDINE IN NACL 20-0.9 MG/50ML-% IV SOLN: 20.0000 mg | Freq: Once | INTRAVENOUS | Status: DC | PRN

## 2019-12-01 MED ORDER — SODIUM CHLORIDE 0.9 % IV SOLN: INTRAVENOUS | Status: DC | PRN

## 2019-12-01 NOTE — Progress Notes (Signed)
  Diagnosis: COVID-19  Physician: Dr. Asencion Noble   Procedure: Sotrovimab  Complications: No immediate complications noted.  Discharge: Discharged home   Shelby Henson 12/01/2019

## 2019-12-01 NOTE — Discharge Instructions (Signed)

## 2019-12-06 ENCOUNTER — Encounter: Payer: Self-pay | Admitting: Family Medicine

## 2019-12-08 ENCOUNTER — Other Ambulatory Visit
Admission: RE | Admit: 2019-12-08 | Discharge: 2019-12-08 | Disposition: A | Discharge status: Home / Self Care | Payer: No Typology Code available for payment source | Source: Ambulatory Visit | Attending: Family Medicine | Admitting: Family Medicine

## 2019-12-08 ENCOUNTER — Other Ambulatory Visit: Payer: Self-pay | Admitting: Family Medicine

## 2019-12-08 ENCOUNTER — Telehealth (INDEPENDENT_AMBULATORY_CARE_PROVIDER_SITE_OTHER): Payer: No Typology Code available for payment source | Admitting: Family Medicine

## 2019-12-08 ENCOUNTER — Encounter: Payer: Self-pay | Admitting: Family Medicine

## 2019-12-08 ENCOUNTER — Other Ambulatory Visit: Payer: Self-pay

## 2019-12-08 ENCOUNTER — Ambulatory Visit
Admission: RE | Admit: 2019-12-08 | Discharge: 2019-12-08 | Disposition: A | Discharge status: Home / Self Care | Payer: No Typology Code available for payment source | Source: Ambulatory Visit | Attending: Family Medicine | Admitting: Family Medicine

## 2019-12-08 VITALS — Ht 59.0 in | Wt 176.0 lb

## 2019-12-08 DIAGNOSIS — R059 Cough, unspecified: Secondary | ICD-10-CM | POA: Diagnosis not present

## 2019-12-08 DIAGNOSIS — R509 Fever, unspecified: Secondary | ICD-10-CM | POA: Diagnosis not present

## 2019-12-08 DIAGNOSIS — J4521 Mild intermittent asthma with (acute) exacerbation: Secondary | ICD-10-CM

## 2019-12-08 DIAGNOSIS — U071 COVID-19: Secondary | ICD-10-CM

## 2019-12-08 LAB — BASIC METABOLIC PANEL
Anion gap: 9 (ref 5–15)
BUN: 17 mg/dL (ref 6–20)
CO2: 24 mmol/L (ref 22–32)
Calcium: 9.3 mg/dL (ref 8.9–10.3)
Chloride: 105 mmol/L (ref 98–111)
Creatinine, Ser: 0.57 mg/dL (ref 0.44–1.00)
GFR, Estimated: >60 mL/min (ref >60)
Glucose, Bld: 99 mg/dL (ref 70–99)
Potassium: 3.4 mmol/L — ABNORMAL LOW (ref 3.5–5.1)
Sodium: 138 mmol/L (ref 135–145)

## 2019-12-08 LAB — CBC WITH DIFFERENTIAL/PLATELET
Abs Immature Granulocytes: 0.01 10*3/uL (ref 0.00–0.07)
Basophils Absolute: 0 10*3/uL (ref 0.0–0.1)
Basophils Relative: 0 %
Eosinophils Absolute: 0.3 10*3/uL (ref 0.0–0.5)
Eosinophils Relative: 6 %
HCT: 37.9 % (ref 36.0–46.0)
Hemoglobin: 12.4 g/dL (ref 12.0–15.0)
Immature Granulocytes: 0 %
Lymphocytes Relative: 37 %
Lymphs Abs: 2.1 10*3/uL (ref 0.7–4.0)
MCH: 27.5 pg (ref 26.0–34.0)
MCHC: 32.7 g/dL (ref 30.0–36.0)
MCV: 84 fL (ref 80.0–100.0)
Monocytes Absolute: 0.5 10*3/uL (ref 0.1–1.0)
Monocytes Relative: 9 %
Neutro Abs: 2.6 10*3/uL (ref 1.7–7.7)
Neutrophils Relative %: 48 %
Platelets: 252 10*3/uL (ref 150–400)
RBC: 4.51 MIL/uL (ref 3.87–5.11)
RDW: 13.2 % (ref 11.5–15.5)
WBC: 5.5 10*3/uL (ref 4.0–10.5)
nRBC: 0 % (ref 0.0–0.2)

## 2019-12-08 MED ORDER — AZITHROMYCIN 250 MG PO TABS: ORAL_TABLET | ORAL | 0 refills | Status: DC

## 2019-12-08 NOTE — Progress Notes (Signed)
Name: Shelby Henson   MRN: [id number]    DOB: March 17, 1970   Date:12/08/2019       Progress Note  Subjective  Chief Complaint  Chief Complaint  Patient presents with  . Covid Positive    Tested positive 11/28/19    I connected with  Violeta Gelinas on 12/08/19 at  3:40 PM EST by telephone and verified that I am speaking with the correct person using two identifiers.  I discussed the limitations, risks, security and privacy concerns of performing an evaluation and management service by telephone and the availability of in person appointments. Staff also discussed with the patient that there may be a patient responsible charge related to this service. Patient agreed on having a virtual visit  Patient Location: at home  Provider Location: [facility]  HPI  COVID-19 viral infection: she developed nasal congestion and sore throat October 28 th, 2021, woke up feeling well on Friday am, but towards the middle of the day she did not feel well, so she left work and was tested. She developed a fever ( Tmax 100.1 )  cough and some left side chest tightness, she used nebulizer multiple times a day because of asthma with mild improvement of symptoms. She gradually started to feel better with Mucinex . She also had monoclonal antibody on 11/30/2019. She was supposed to go back to work yesterday but because she still has a low grade fever she was sent home and advised to follow up with [place]. She is still taking Tylenol    Patient Active Problem List   Diagnosis Date Noted  . Pain in right hip 01/12/2019  . Osteonecrosis of right hip ([place]) 11/12/2018  . Vitamin D deficiency 02/25/2018  . Anemia, unspecified 01/08/2017  . Chronic pain 10/24/2015  . Hot flashes 10/24/2015  . Mood changes 10/24/2015  . Migraine without aura and without status migrainosus, not intractable 10/24/2015  . Metabolic syndrome 09/98/3382  . Dyslipidemia 10/24/2015  . Allergic rhinitis, seasonal 05/17/2015  . Asthma, mild intermittent  08/08/2014  . Chronic insomnia 08/08/2014  . DDD (degenerative disc disease), lumbar 08/08/2014  . Mood disorder ([employer]) 08/08/2014  . Gastro-esophageal reflux disease without esophagitis 08/08/2014  . H/O total hysterectomy 08/08/2014  . Irritable bowel syndrome with diarrhea 08/08/2014  . Osteoarthritis of right hip 08/08/2014  . Adult BMI 30+ 08/08/2014  . SBO (spina bifida occulta) 08/08/2014    Past Surgical History:  Procedure Laterality Date  . ABDOMINAL HYSTERECTOMY    . ABLATION    . BREAST CYST ASPIRATION Left   . CESAREAN SECTION    . CESAREAN SECTION    . CESAREAN SECTION    . HIP SURGERY  10/2018  . LAPAROSCOPY  2018  . LEEP    . TOTAL HIP ARTHROPLASTY Right 11/12/2018   Procedure: TOTAL HIP ARTHROPLASTY ANTERIOR APPROACH;  Surgeon: Lovell Sheehan, MD;  Location: [facility] ORS;  Service: Orthopedics;  Laterality: Right;  . TOTAL VAGINAL HYSTERECTOMY      Family History  Problem Relation Age of Onset  . Bipolar disorder Brother   . Depression Mother   . Breast cancer Neg Hx     Social History   Socioeconomic History  . Marital status: Divorced    Spouse name: Not on file  . Number of children: 3  . Years of education: Not on file  . Highest education level: Not on file  Occupational History  . Not on file  Tobacco Use  . Smoking status: Never  Smoker  . Smokeless tobacco: Never Used  Vaping Use  . Vaping Use: Never used  Substance and Sexual Activity  . Alcohol use: Yes    Alcohol/week: 0.0 standard drinks    Comment: occasionally  . Drug use: No  . Sexual activity: Not Currently    Comment: boyfriend unable  Other Topics Concern  . Not on file  Social History Narrative  . Not on file   Social Determinants of Health   Financial Resource Strain:   . Difficulty of Paying Living Expenses: Not on file  Food Insecurity:   . Worried About Charity fundraiser in the Last Year: Not on file  . Ran Out of Food in the Last Year: Not on file    Transportation Needs:   . Lack of Transportation (Medical): Not on file  . Lack of Transportation (Non-Medical): Not on file  Physical Activity:   . Days of Exercise per Week: Not on file  . Minutes of Exercise per Session: Not on file  Stress:   . Feeling of Stress : Not on file  Social Connections:   . Frequency of Communication with Friends and Family: Not on file  . Frequency of Social Gatherings with Friends and Family: Not on file  . Attends Religious Services: Not on file  . Active Member of Clubs or Organizations: Not on file  . Attends Archivist Meetings: Not on file  . Marital Status: Not on file  Intimate Partner Violence:   . Fear of Current or Ex-Partner: Not on file  . Emotionally Abused: Not on file  . Physically Abused: Not on file  . Sexually Abused: Not on file     Current Outpatient Medications:  .  albuterol (PROVENTIL) (2.5 MG/3ML) 0.083% nebulizer solution, USE ONE VIAL VIA NEBULIZER EVERY 6 HOURS AS NEEDED FOR WHEEZING OR SHORTNESS OF BREATH., Disp: 75 mL, Rfl: 2 .  budesonide (PULMICORT) 0.5 MG/2ML nebulizer solution, TAKE 2 MLS BY NEBULIZATION 2 TIMES DAILY., Disp: 60 mL, Rfl: 5 .  cloNIDine (CATAPRES) 0.1 MG tablet, Take 1 tablet (0.1 mg total) by mouth daily., Disp: 30 tablet, Rfl: 11 .  desvenlafaxine (PRISTIQ) 50 MG 24 hr tablet, Take 1 tablet (50 mg total) by mouth daily., Disp: 90 tablet, Rfl: 1 .  meloxicam (MOBIC) 15 MG tablet, Take 15 mg by mouth daily., Disp: , Rfl:  .  metaxalone (SKELAXIN) 800 MG tablet, Take 800 mg by mouth 3 (three) times daily., Disp: , Rfl:  .  Oxycodone HCl 10 MG TABS, Take 1 tablet by mouth in the morning, at noon, and at bedtime., Disp: , Rfl:  .  Semaglutide,0.25 or 0.5MG /DOS, (OZEMPIC, 0.25 OR 0.5 MG/DOSE,) 2 MG/1.5ML SOPN, Inject 0.375 mLs (0.5 mg total) into the skin once a week., Disp: 1.5 mL, Rfl: 2 .  SUMAtriptan (IMITREX) 100 MG tablet, Take 1 tablet (100 mg total) by mouth every 2 (two) hours as needed  for migraine. May repeat in 2 hours if headache persists or recurs., Disp: 10 tablet, Rfl: 0 .  tiZANidine (ZANAFLEX) 4 MG tablet, Take 4 mg by mouth 2 (two) times daily., Disp: , Rfl:  .  azithromycin (ZITHROMAX) 250 MG tablet, Take as directed, Disp: 6 tablet, Rfl: 0 .  colchicine 0.6 MG tablet, Take 1 tablet (0.6 mg total) by mouth daily., Disp: 30 tablet, Rfl: 1 .  omeprazole (PRILOSEC) 40 MG capsule, Take 1 capsule (40 mg total) by mouth daily. (Patient not taking: Reported on 12/08/2019), Disp: 30  capsule, Rfl: 0  Allergies  Allergen Reactions  . Celecoxib Hives  . Prednisone     intolerance, makes her angry. Makes her angry  . Topamax [Topiramate]     Mood changes  . Zolpidem   . Codeine Rash  . Penicillins Rash    Did it involve swelling of the face/tongue/throat, SOB, or low BP? No Did it involve sudden or severe rash/hives, skin peeling, or any reaction on the inside of your mouth or nose? Yes Did you need to seek medical attention at a hospital or doctor's office? Yes When did it last happen? More than 20 years ago If all above answers are "NO", may proceed with cephalosporin use.     I personally reviewed active problem list, medication list, allergies, family history, social history, health maintenance with the patient/caregiver today.   ROS  Ten systems reviewed and is negative except as mentioned in HPI   Objective  Virtual encounter, vitals not obtained.  Body mass index is 35.55 kg/m.  Physical Exam  Awake, alert and oriented   PHQ2/9: Depression screen [facility] 2/9 12/08/2019 11/11/2019 09/08/2019 05/25/2019 09/04/2018  Decreased Interest 0 3 0 2 1  Down, Depressed, Hopeless 0 0 0 0 1  PHQ - 2 Score 0 3 0 2 2  Altered sleeping - 0 1 3 3   Tired, decreased energy - 2 1 3 3   Change in appetite - 0 0 0 1  Feeling bad or failure about yourself  - 0 0 0 0  Trouble concentrating - 0 0 1 0  Moving slowly or fidgety/restless - 0 0 0 1  Suicidal thoughts - 0 0  0 0  PHQ-9 Score - 5 2 9 10   Difficult doing work/chores - Very difficult Not difficult at all Very difficult Somewhat difficult  Some recent data might be hidden   PHQ-2/9 Result is negative.    Fall Risk: Fall Risk  12/08/2019 11/11/2019 09/08/2019 05/25/2019 09/04/2018  Falls in the past year? 0 0 0 0 0  Number falls in past yr: 0 0 0 0 0  Injury with Fall? 0 0 0 0 0  Comment - - - - -    Assessment & Plan  1. COVID-19  - DG Chest 2 View; Future - CBC with Differential/Platelet; Future - BASIC METABOLIC PANEL WITH GFR; Future  2. Fever, unspecified fever cause  - CBC with Differential/Platelet; Future - BASIC METABOLIC PANEL WITH GFR; Future - azithromycin (ZITHROMAX) 250 MG tablet; Take as directed  Dispense: 6 tablet; Refill: 0  3. Mild intermittent asthma with exacerbation   4. Cough  - azithromycin (ZITHROMAX) 250 MG tablet; Take as directed  Dispense: 6 tablet; Refill: 0  I discussed the assessment and treatment plan with the patient. The patient was provided an opportunity to ask questions and all were answered. The patient agreed with the plan and demonstrated an understanding of the instructions.   The patient was advised to call back or seek an in-person evaluation if the symptoms worsen or if the condition fails to improve as anticipated.  I provided 15 minutes of non-face-to-face time during this encounter.  Loistine Chance, MD

## 2019-12-08 NOTE — Progress Notes (Deleted)
Name: Shelby Henson   MRN: [id number]    DOB: 1970/01/30   Date:12/08/2019       Progress Note  Subjective  Chief Complaint  Follow up   HPI Chest tightness: she has a history of asthma, and was well controlled, however 6 weeks ago noticed chest pressure ( like something is sitting on her chest) started suddenly, constant 2-3/10, she resumed albuterol treatment multiple times a day without improvement. She denies wheezing or cough. She had cold symptoms prior to symptoms, she was tested for COVID-19 at the time and it was negative. She states no aggravating or alleviating her symptoms.   Migraines: she was asymptomatic for a long time, however about a month ago she had a severe episodes. Frontal headache described as throbbing, had phonophobia and nausea, but no vomiting. She took Excedrin migraine but the headache lasted 4 days.  *** Patient Active Problem List   Diagnosis Date Noted  . Pain in right hip 01/12/2019  . Osteonecrosis of right hip ([place]) 11/12/2018  . Vitamin D deficiency 02/25/2018  . Anemia, unspecified 01/08/2017  . Chronic pain 10/24/2015  . Hot flashes 10/24/2015  . Mood changes 10/24/2015  . Migraine without aura and without status migrainosus, not intractable 10/24/2015  . Metabolic syndrome 62/13/0865  . Dyslipidemia 10/24/2015  . Allergic rhinitis, seasonal 05/17/2015  . Asthma, mild intermittent 08/08/2014  . Chronic insomnia 08/08/2014  . DDD (degenerative disc disease), lumbar 08/08/2014  . Mood disorder ([place]) 08/08/2014  . Gastro-esophageal reflux disease without esophagitis 08/08/2014  . H/O total hysterectomy 08/08/2014  . Irritable bowel syndrome with diarrhea 08/08/2014  . Osteoarthritis of right hip 08/08/2014  . Adult BMI 30+ 08/08/2014  . SBO (spina bifida occulta) 08/08/2014    Past Surgical History:  Procedure Laterality Date  . ABDOMINAL HYSTERECTOMY    . ABLATION    . BREAST CYST ASPIRATION Left   . CESAREAN SECTION    . CESAREAN SECTION     . CESAREAN SECTION    . HIP SURGERY  10/2018  . LAPAROSCOPY  2018  . LEEP    . TOTAL HIP ARTHROPLASTY Right 11/12/2018   Procedure: TOTAL HIP ARTHROPLASTY ANTERIOR APPROACH;  Surgeon: Lovell Sheehan, MD;  Location: [facility] ORS;  Service: Orthopedics;  Laterality: Right;  . TOTAL VAGINAL HYSTERECTOMY      Family History  Problem Relation Age of Onset  . Bipolar disorder Brother   . Depression Mother   . Breast cancer Neg Hx     Social History   Tobacco Use  . Smoking status: Never Smoker  . Smokeless tobacco: Never Used  Substance Use Topics  . Alcohol use: Yes    Alcohol/week: 0.0 standard drinks    Comment: occasionally     Current Outpatient Medications:  .  albuterol (PROVENTIL) (2.5 MG/3ML) 0.083% nebulizer solution, USE ONE VIAL VIA NEBULIZER EVERY 6 HOURS AS NEEDED FOR WHEEZING OR SHORTNESS OF BREATH., Disp: 75 mL, Rfl: 2 .  budesonide (PULMICORT) 0.5 MG/2ML nebulizer solution, TAKE 2 MLS BY NEBULIZATION 2 TIMES DAILY., Disp: 60 mL, Rfl: 5 .  cloNIDine (CATAPRES) 0.1 MG tablet, Take 1 tablet (0.1 mg total) by mouth daily., Disp: 30 tablet, Rfl: 11 .  colchicine 0.6 MG tablet, Take 1 tablet (0.6 mg total) by mouth daily., Disp: 30 tablet, Rfl: 1 .  desvenlafaxine (PRISTIQ) 50 MG 24 hr tablet, Take 1 tablet (50 mg total) by mouth daily., Disp: 90 tablet, Rfl: 1 .  meloxicam (MOBIC) 15 MG tablet, Take 15  mg by mouth daily., Disp: , Rfl:  .  metaxalone (SKELAXIN) 800 MG tablet, Take 800 mg by mouth 3 (three) times daily., Disp: , Rfl:  .  omeprazole (PRILOSEC) 40 MG capsule, Take 1 capsule (40 mg total) by mouth daily., Disp: 30 capsule, Rfl: 0 .  Oxycodone HCl 10 MG TABS, Take 1 tablet by mouth in the morning, at noon, and at bedtime., Disp: , Rfl:  .  Semaglutide,0.25 or 0.5MG /DOS, (OZEMPIC, 0.25 OR 0.5 MG/DOSE,) 2 MG/1.5ML SOPN, Inject 0.375 mLs (0.5 mg total) into the skin once a week., Disp: 1.5 mL, Rfl: 2 .  SUMAtriptan (IMITREX) 100 MG tablet, Take 1 tablet (100 mg  total) by mouth every 2 (two) hours as needed for migraine. May repeat in 2 hours if headache persists or recurs., Disp: 10 tablet, Rfl: 0 .  tiZANidine (ZANAFLEX) 4 MG tablet, Take 4 mg by mouth 2 (two) times daily., Disp: , Rfl:   Allergies  Allergen Reactions  . Celecoxib Hives  . Prednisone     intolerance, makes her angry. Makes her angry  . Topamax [Topiramate]     Mood changes  . Zolpidem   . Codeine Rash  . Penicillins Rash    Did it involve swelling of the face/tongue/throat, SOB, or low BP? No Did it involve sudden or severe rash/hives, skin peeling, or any reaction on the inside of your mouth or nose? Yes Did you need to seek medical attention at a hospital or doctor's office? Yes When did it last happen? More than 20 years ago If all above answers are "NO", may proceed with cephalosporin use.     I personally reviewed {Reviewed:14835} with the patient/caregiver today.   ROS  ***  Objective  There were no vitals filed for this visit.  There is no height or weight on file to calculate BMI.  Physical Exam ***  No results found for this or any previous visit (from the past 2160 hour(s)).  Diabetic Foot Exam: Diabetic Foot Exam - Simple   No data filed     ***  PHQ2/9: Depression screen [facility] 2/9 11/11/2019 09/08/2019 05/25/2019 09/04/2018 02/25/2018  Decreased Interest 3 0 2 1 0  Down, Depressed, Hopeless 0 0 0 1 0  PHQ - 2 Score 3 0 2 2 0  Altered sleeping 0 1 3 3 3   Tired, decreased energy 2 1 3 3  0  Change in appetite 0 0 0 1 0  Feeling bad or failure about yourself  0 0 0 0 0  Trouble concentrating 0 0 1 0 3  Moving slowly or fidgety/restless 0 0 0 1 0  Suicidal thoughts 0 0 0 0 0  PHQ-9 Score 5 2 9 10 6   Difficult doing work/chores Very difficult Not difficult at all Very difficult Somewhat difficult Somewhat difficult    phq 9 is {gen pos [id number]} ***  Fall Risk: Fall Risk  11/11/2019 09/08/2019 05/25/2019 09/04/2018 02/25/2018  Falls in  the past year? 0 0 0 0 0  Number falls in past yr: 0 0 0 0 0  Injury with Fall? 0 0 0 0 0  Comment - - - - -   ***   Functional Status Survey:   ***   Assessment & Plan  *** There are no diagnoses linked to this encounter.

## 2019-12-10 ENCOUNTER — Ambulatory Visit: Payer: No Typology Code available for payment source | Admitting: Family Medicine

## 2019-12-10 ENCOUNTER — Encounter: Payer: Self-pay | Admitting: Family Medicine

## 2019-12-17 ENCOUNTER — Other Ambulatory Visit: Payer: Self-pay | Admitting: Obstetrics & Gynecology

## 2019-12-17 MED ORDER — CLONIDINE HCL 0.2 MG PO TABS: 0.2000 mg | ORAL_TABLET | Freq: Two times a day (BID) | ORAL | 2 refills | Status: DC

## 2019-12-21 NOTE — Progress Notes (Signed)
Name: Shelby Henson   MRN: [id number]    DOB: 02/01/1970   Date:12/23/2019       Progress Note  Subjective  Chief Complaint  Follow up   HPI    History of COVID-19: she is back to work since 12/07/2019, breathing is back to normal but continues to have lack of sensation of smell and taste and has lost 6 lbs since she has been sick. She denies any lingering cough , wheezing or SOB , she feels tired but is back to her baseline   Grieving: she was dating someone for the past 6 months, he had cancer and died suddenly on the 57 th, she is struggling, cannot sleep well at night, focus is okay while at work, feels worse when she is at home  Metabolic Syndrome: she has been on Ozempic, she states medication curbs her appetite, she also avoids sweets , last A1C normal  Obesity: she is on Ozempic, also eats healthier, losing weight but may be also from   Chest tightness: possible pericarditis, she is feeling much better, very seldom may have a twinge of pain on left side, but no problems taking deep breaths or exercising.   Patient Active Problem List   Diagnosis Date Noted   Pain in right hip 01/12/2019   Vitamin D deficiency 02/25/2018   Anemia, unspecified 01/08/2017   Chronic pain 10/24/2015   Hot flashes 10/24/2015   Mood changes 10/24/2015   Migraine without aura and without status migrainosus, not intractable 78/58/8502   Metabolic syndrome 77/41/2878   Dyslipidemia 10/24/2015   Allergic rhinitis, seasonal 05/17/2015   Asthma, mild intermittent 08/08/2014   Chronic insomnia 08/08/2014   DDD (degenerative disc disease), lumbar 08/08/2014   Mood disorder ([place]) 08/08/2014   Gastro-esophageal reflux disease without esophagitis 08/08/2014   H/O total hysterectomy 08/08/2014   Irritable bowel syndrome with diarrhea 08/08/2014   Osteoarthritis of right hip 08/08/2014   Adult BMI 30+ 08/08/2014   SBO (spina bifida occulta) 08/08/2014    Past Surgical History:   Procedure Laterality Date   ABDOMINAL HYSTERECTOMY     ABLATION     BREAST CYST ASPIRATION Left    CESAREAN SECTION     CESAREAN SECTION     CESAREAN SECTION     HIP SURGERY  10/2018   LAPAROSCOPY  2018   LEEP     TOTAL HIP ARTHROPLASTY Right 11/12/2018   Procedure: TOTAL HIP ARTHROPLASTY ANTERIOR APPROACH;  Surgeon: Lovell Sheehan, MD;  Location: [facility] ORS;  Service: Orthopedics;  Laterality: Right;   TOTAL VAGINAL HYSTERECTOMY      Family History  Problem Relation Age of Onset   Bipolar disorder Brother    Depression Mother    Breast cancer Neg Hx     Social History   Tobacco Use   Smoking status: Never Smoker   Smokeless tobacco: Never Used  Substance Use Topics   Alcohol use: Yes    Alcohol/week: 0.0 standard drinks    Comment: occasionally     Current Outpatient Medications:    albuterol (PROVENTIL) (2.5 MG/3ML) 0.083% nebulizer solution, USE ONE VIAL VIA NEBULIZER EVERY 6 HOURS AS NEEDED FOR WHEEZING OR SHORTNESS OF BREATH., Disp: 75 mL, Rfl: 2   budesonide (PULMICORT) 0.5 MG/2ML nebulizer solution, TAKE 2 MLS BY NEBULIZATION 2 TIMES DAILY., Disp: 60 mL, Rfl: 5   cloNIDine (CATAPRES) 0.2 MG tablet, Take 1 tablet (0.2 mg total) by mouth 2 (two) times daily. (Patient taking differently: Take 0.6 mg by mouth  2 (two) times daily. ), Disp: 90 tablet, Rfl: 2   colchicine 0.6 MG tablet, Take 1 tablet (0.6 mg total) by mouth daily., Disp: 30 tablet, Rfl: 1   desvenlafaxine (PRISTIQ) 50 MG 24 hr tablet, Take 1 tablet (50 mg total) by mouth daily., Disp: 90 tablet, Rfl: 1   meloxicam (MOBIC) 15 MG tablet, Take 15 mg by mouth daily., Disp: , Rfl:    metaxalone (SKELAXIN) 800 MG tablet, Take 800 mg by mouth 3 (three) times daily., Disp: , Rfl:    Oxycodone HCl 10 MG TABS, Take 1 tablet by mouth in the morning, at noon, and at bedtime., Disp: , Rfl:    Semaglutide,0.25 or 0.5MG /DOS, (OZEMPIC, 0.25 OR 0.5 MG/DOSE,) 2 MG/1.5ML SOPN, Inject 0.5 mg into the  skin once a week., Disp: 1.5 mL, Rfl: 1   SUMAtriptan (IMITREX) 100 MG tablet, Take 1 tablet (100 mg total) by mouth every 2 (two) hours as needed for migraine. May repeat in 2 hours if headache persists or recurs., Disp: 10 tablet, Rfl: 0   tiZANidine (ZANAFLEX) 4 MG tablet, Take 4 mg by mouth 2 (two) times daily., Disp: , Rfl:    traZODone (DESYREL) 50 MG tablet, Take 0.5-1 tablets (25-50 mg total) by mouth at bedtime as needed for sleep., Disp: 90 tablet, Rfl: 0  Allergies  Allergen Reactions   Celecoxib Hives   Prednisone     intolerance, makes her angry. Makes her angry   Topamax [Topiramate]     Mood changes   Zolpidem    Codeine Rash   Penicillins Rash    Did it involve swelling of the face/tongue/throat, SOB, or low BP? No Did it involve sudden or severe rash/hives, skin peeling, or any reaction on the inside of your mouth or nose? Yes Did you need to seek medical attention at a hospital or doctor's office? Yes When did it last happen? More than 20 years ago If all above answers are NO, may proceed with cephalosporin use.     I personally reviewed active problem list, medication list, allergies, family history, social history, health maintenance with the patient/caregiver today.   ROS  Ten systems reviewed and is negative except as mentioned in HPI   Objective  Vitals:   12/22/19 1056  BP: 130/86  Pulse: 93  Resp: 16  Temp: 98.2 F (36.8 C)  TempSrc: Oral  SpO2: 98%  Weight: 170 lb 6.4 oz (77.3 kg)  Height: 4\' 11"  (1.499 m)    Body mass index is 34.42 kg/m.  Physical Exam  Constitutional: Patient appears well-developed and well-nourished. Obese  No distress.  HEENT: head atraumatic, normocephalic, pupils equal and reactive to light,  neck supple Cardiovascular: Normal rate, regular rhythm and normal heart sounds.  No murmur heard. No BLE edema. Pulmonary/Chest: Effort normal and breath sounds normal. No respiratory distress. Abdominal:  Soft.  There is no tenderness. Psychiatric: Patient has a normal mood and affect. behavior is normal. Judgment and thought content normal.  Recent Results (from the past 2160 hour(s))  CBC with Differential/Platelet     Status: None   Collection Time: 12/08/19  4:33 PM  Result Value Ref Range   WBC 5.5 4.0 - 10.5 K/uL   RBC 4.51 3.87 - 5.11 MIL/uL   Hemoglobin 12.4 12.0 - 15.0 g/dL   HCT 37.9 36 - 46 %   MCV 84.0 80.0 - 100.0 fL   MCH 27.5 26.0 - 34.0 pg   MCHC 32.7 30.0 - 36.0 g/dL  RDW 13.2 11.5 - 15.5 %   Platelets 252 150 - 400 K/uL   nRBC 0.0 0.0 - 0.2 %   Neutrophils Relative % 48 %   Neutro Abs 2.6 1.7 - 7.7 K/uL   Lymphocytes Relative 37 %   Lymphs Abs 2.1 0.7 - 4.0 K/uL   Monocytes Relative 9 %   Monocytes Absolute 0.5 0.1 - 1.0 K/uL   Eosinophils Relative 6 %   Eosinophils Absolute 0.3 0.0 - 0.5 K/uL   Basophils Relative 0 %   Basophils Absolute 0.0 0.0 - 0.1 K/uL   Immature Granulocytes 0 %   Abs Immature Granulocytes 0.01 0.00 - 0.07 K/uL    Comment: Performed at [facility], [address] [place], [place] [address]  Basic metabolic panel     Status: Abnormal   Collection Time: 12/08/19  4:33 PM  Result Value Ref Range   Sodium 138 135 - 145 mmol/L   Potassium 3.4 (L) 3.5 - 5.1 mmol/L   Chloride 105 98 - 111 mmol/L   CO2 24 22 - 32 mmol/L   Glucose, Bld 99 70 - 99 mg/dL    Comment: Glucose reference range applies only to samples taken after fasting for at least 8 hours.   BUN 17 6 - 20 mg/dL   Creatinine, Ser 0.57 0.44 - 1.00 mg/dL   Calcium 9.3 8.9 - 10.3 mg/dL   GFR, Estimated >60 >60 mL/min    Comment: (NOTE) Calculated using the CKD-EPI Creatinine Equation (2021)    Anion gap 9 5 - 15    Comment: Performed at [facility], [address] [place], [place] [address]     PHQ2/9: Depression screen [facility] 2/9 12/22/2019 12/08/2019 11/11/2019 09/08/2019 05/25/2019  Decreased Interest 1 0 3 0 2  Down, Depressed, Hopeless 3 0 0 0 0   PHQ - 2 Score 4 0 3 0 2  Altered sleeping 3 - 0 1 3  Tired, decreased energy 0 - 2 1 3   Change in appetite 3 - 0 0 0  Feeling bad or failure about yourself  0 - 0 0 0  Trouble concentrating 1 - 0 0 1  Moving slowly or fidgety/restless 0 - 0 0 0  Suicidal thoughts 0 - 0 0 0  PHQ-9 Score 11 - 5 2 9   Difficult doing work/chores Not difficult at all - Very difficult Not difficult at all Very difficult  Some recent data might be hidden    phq 9 is positive   Fall Risk: Fall Risk  12/22/2019 12/08/2019 11/11/2019 09/08/2019 05/25/2019  Falls in the past year? 0 0 0 0 0  Number falls in past yr: 0 0 0 0 0  Injury with Fall? 0 0 0 0 0  Comment - - - - -      Functional Status Survey: Is the patient deaf or have difficulty hearing?: No Does the patient have difficulty seeing, even when wearing glasses/contacts?: No Does the patient have difficulty concentrating, remembering, or making decisions?: No Does the patient have difficulty walking or climbing stairs?: Yes Does the patient have difficulty dressing or bathing?: No Does the patient have difficulty doing errands alone such as visiting a doctor's office or shopping?: No    Assessment & Plan  1. 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   2. Psychophysiological insomnia  - traZODone (DESYREL) 50 MG tablet; Take 0.5-1 tablets (25-50 mg total) by mouth at bedtime as needed for sleep.  Dispense:  90 tablet; Refill: 0  3. Grieving  Discussed hospice grieving counseling   4. Metabolic syndrome  - [id number],5.05 or 0.5MG /DOS, (OZEMPIC, 0.25 OR 0.5 MG/DOSE,) 2 MG/1.5ML SOPN; Inject 0.5 mg into the skin once a week.  Dispense: 1.5 mL; Refill: 1  5. Obesity, Class II, BMI 35-39.9  - Semaglutide,0.25 or 0.5MG /DOS, (OZEMPIC, 0.25 OR 0.5 MG/DOSE,) 2 MG/1.5ML SOPN; Inject 0.5 mg into the skin once a week.  Dispense: 1.5 mL; Refill: 1

## 2019-12-22 ENCOUNTER — Other Ambulatory Visit: Payer: Self-pay

## 2019-12-22 ENCOUNTER — Ambulatory Visit (INDEPENDENT_AMBULATORY_CARE_PROVIDER_SITE_OTHER): Payer: No Typology Code available for payment source | Admitting: Family Medicine

## 2019-12-22 ENCOUNTER — Other Ambulatory Visit: Payer: Self-pay | Admitting: Family Medicine

## 2019-12-22 ENCOUNTER — Encounter: Payer: Self-pay | Admitting: Family Medicine

## 2019-12-22 VITALS — BP 130/86 | HR 93 | Temp 98.2°F | Resp 16 | Ht 59.0 in | Wt 170.4 lb

## 2019-12-22 DIAGNOSIS — E8881 Metabolic syndrome: Secondary | ICD-10-CM

## 2019-12-22 DIAGNOSIS — F4321 Adjustment disorder with depressed mood: Secondary | ICD-10-CM

## 2019-12-22 DIAGNOSIS — R439 Unspecified disturbances of smell and taste: Secondary | ICD-10-CM | POA: Diagnosis not present

## 2019-12-22 DIAGNOSIS — R438 Other disturbances of smell and taste: Secondary | ICD-10-CM

## 2019-12-22 DIAGNOSIS — U099 Post covid-19 condition, unspecified: Secondary | ICD-10-CM

## 2019-12-22 DIAGNOSIS — Z1211 Encounter for screening for malignant neoplasm of colon: Secondary | ICD-10-CM

## 2019-12-22 DIAGNOSIS — E669 Obesity, unspecified: Secondary | ICD-10-CM

## 2019-12-22 DIAGNOSIS — F5104 Psychophysiologic insomnia: Secondary | ICD-10-CM

## 2019-12-22 MED ORDER — TRAZODONE HCL 50 MG PO TABS: 25.0000 mg | ORAL_TABLET | Freq: Every evening | ORAL | 0 refills | Status: DC | PRN

## 2019-12-22 MED ORDER — OZEMPIC (0.25 OR 0.5 MG/DOSE) 2 MG/1.5ML ~~LOC~~ SOPN: 0.5000 mg | PEN_INJECTOR | SUBCUTANEOUS | 1 refills | Status: DC

## 2019-12-22 NOTE — Patient Instructions (Signed)
Dr. Wohl [phone] °

## 2020-01-13 ENCOUNTER — Encounter
Admission: RE | Admit: 2020-01-13 | Discharge: 2020-01-13 | Disposition: A | Discharge status: Home / Self Care | Payer: No Typology Code available for payment source | Source: Ambulatory Visit | Attending: Orthopedic Surgery | Admitting: Orthopedic Surgery

## 2020-01-13 ENCOUNTER — Other Ambulatory Visit: Payer: Self-pay

## 2020-01-13 DIAGNOSIS — Z96641 Presence of right artificial hip joint: Secondary | ICD-10-CM | POA: Insufficient documentation

## 2020-01-13 MED ORDER — TECHNETIUM TC 99M MEDRONATE IV KIT
20.0000 | PACK | Freq: Once | INTRAVENOUS | Status: AC | PRN
  Administered 2020-01-13: 23.3 via INTRAVENOUS

## 2020-01-20 ENCOUNTER — Other Ambulatory Visit: Payer: Self-pay | Admitting: Pain Medicine

## 2020-02-16 ENCOUNTER — Other Ambulatory Visit: Payer: Self-pay | Admitting: Orthopedic Surgery

## 2020-02-26 ENCOUNTER — Other Ambulatory Visit: Payer: Self-pay | Admitting: Pain Medicine

## 2020-03-16 ENCOUNTER — Other Ambulatory Visit: Payer: Self-pay | Admitting: Orthopedic Surgery

## 2020-03-16 ENCOUNTER — Encounter: Payer: Self-pay | Admitting: Family Medicine

## 2020-03-18 ENCOUNTER — Other Ambulatory Visit: Payer: Self-pay | Admitting: Pain Medicine

## 2020-03-22 NOTE — Progress Notes (Signed)
Name: Shelby Henson   MRN: [id number]    DOB: August 07, 1970   Date:03/23/2020       Progress Note  Subjective  Chief Complaint  Follow Up  HPI  [place] COVID-19: diagnosed 11/27/2019 had infusion on 11/30/2019 she is back to work since 12/07/2019. She was seen in our office 11/23 for regular visit and was still having chest tightness we gave her colchicine and symptoms resolved however since yesterday pain is back, she thought it was an asthma flare but did not improve with albuterol treatment. Pain is not associated with sob or wheezing. No diaphoresis . She continues to have lack of sense of taste and smell . She has noticed hair loss   Metabolic Syndrome: she was Ozempic, and was losing weight, however since COVID-19 she has no appetite and stopped using medication two weeks ago. Last A1C normal  Chronic right hip pain: s/p replacement but still on modified duty at work and has daily pain under the care of Dr.Bowers   Insomnia: taking trazodone and denies side effects of medication   Mood swings:she has been on Pritiqfor many years, she has afamily history of bipolarShe denies side effects, no anxiety symptoms, phq 9 is normal, it helps control her mood    Patient Active Problem List   Diagnosis Date Noted  . 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 03/23/2020  . Pain in right hip 01/12/2019  . Vitamin D deficiency 02/25/2018  . Chronic pain 10/24/2015  . Hot flashes 10/24/2015  . Migraine without aura and without status migrainosus, not intractable 10/24/2015  . Metabolic syndrome 32/67/1245  . Dyslipidemia 10/24/2015  . Allergic rhinitis, seasonal 05/17/2015  . Asthma, mild intermittent 08/08/2014  . Chronic insomnia 08/08/2014  . DDD (degenerative disc disease), lumbar 08/08/2014  . Mood disorder ([place]) 08/08/2014  . Gastro-esophageal reflux disease without esophagitis 08/08/2014  . H/O total hysterectomy 08/08/2014  . Irritable bowel syndrome with  diarrhea 08/08/2014  . Osteoarthritis of right hip 08/08/2014  . Adult BMI 30+ 08/08/2014  . SBO (spina bifida occulta) 08/08/2014    Past Surgical History:  Procedure Laterality Date  . ABDOMINAL HYSTERECTOMY    . ABLATION    . BREAST CYST ASPIRATION Left   . CESAREAN SECTION    . CESAREAN SECTION    . CESAREAN SECTION    . HIP SURGERY  10/2018  . LAPAROSCOPY  2018  . LEEP    . TOTAL HIP ARTHROPLASTY Right 11/12/2018   Procedure: TOTAL HIP ARTHROPLASTY ANTERIOR APPROACH;  Surgeon: Lovell Sheehan, MD;  Location: [facility] ORS;  Service: Orthopedics;  Laterality: Right;  . TOTAL VAGINAL HYSTERECTOMY      Family History  Problem Relation Age of Onset  . Bipolar disorder Brother   . Depression Mother   . Breast cancer Neg Hx     Social History   Tobacco Use  . Smoking status: Never Smoker  . Smokeless tobacco: Never Used  Substance Use Topics  . Alcohol use: Yes    Alcohol/week: 0.0 standard drinks    Comment: occasionally     Current Outpatient Medications:  .  albuterol (PROVENTIL) (2.5 MG/3ML) 0.083% nebulizer solution, USE ONE VIAL VIA NEBULIZER EVERY 6 HOURS AS NEEDED FOR WHEEZING OR SHORTNESS OF BREATH., Disp: 75 mL, Rfl: 2 .  budesonide (PULMICORT) 0.5 MG/2ML nebulizer solution, TAKE 2 MLS BY NEBULIZATION 2 TIMES DAILY., Disp: 60 mL, Rfl: 5 .  cloNIDine (CATAPRES) 0.2 MG tablet, Take 1 tablet (0.2 mg  total) by mouth 2 (two) times daily. (Patient taking differently: Take 0.6 mg by mouth 2 (two) times daily.), Disp: 90 tablet, Rfl: 2 .  colchicine 0.6 MG tablet, Take 1 tablet (0.6 mg total) by mouth daily., Disp: 30 tablet, Rfl: 1 .  gabapentin (NEURONTIN) 300 MG capsule, Take 300 mg by mouth daily., Disp: , Rfl:  .  meloxicam (MOBIC) 15 MG tablet, Take 15 mg by mouth daily., Disp: , Rfl:  .  Oxycodone HCl 10 MG TABS, Take 1 tablet by mouth in the morning, at noon, and at bedtime., Disp: , Rfl:  .  tiZANidine (ZANAFLEX) 4 MG tablet, Take 4 mg by mouth 2 (two) times  daily., Disp: , Rfl:  .  desvenlafaxine (PRISTIQ) 50 MG 24 hr tablet, Take 1 tablet (50 mg total) by mouth daily., Disp: 90 tablet, Rfl: 1 .  SUMAtriptan (IMITREX) 100 MG tablet, Take 1 tablet (100 mg total) by mouth every 2 (two) hours as needed for migraine. May repeat in 2 hours if headache persists or recurs., Disp: 10 tablet, Rfl: 0 .  traZODone (DESYREL) 50 MG tablet, Take 0.5-1 tablets (25-50 mg total) by mouth at bedtime as needed for sleep., Disp: 90 tablet, Rfl: 1  Allergies  Allergen Reactions  . Celecoxib Hives  . Prednisone     intolerance, makes her angry. Makes her angry  . Topamax [Topiramate]     Mood changes  . Zolpidem   . Codeine Rash  . Penicillins Rash    Did it involve swelling of the face/tongue/throat, SOB, or low BP? No Did it involve sudden or severe rash/hives, skin peeling, or any reaction on the inside of your mouth or nose? Yes Did you need to seek medical attention at a hospital or doctor's office? Yes When did it last happen? More than 20 years ago If all above answers are "NO", may proceed with cephalosporin use.     I personally reviewed active problem list, medication list, allergies, family history, social history, health maintenance, notes from last encounter with the patient/caregiver today.   ROS  Constitutional: Negative for fever, positive for  weight change.  Respiratory: positive  for cough and shortness of breath.   Cardiovascular: positive  for chest pain but no palpitations.  Gastrointestinal: Negative for abdominal pain, no bowel changes.  Musculoskeletal: Negative for gait problem or joint swelling.  Skin: Negative for rash.  Neurological: Negative for dizziness , positive for intermittent headache.  No other specific complaints in a complete review of systems (except as listed in HPI above).  Objective  Vitals:   03/23/20 1159  BP: 128/72  Pulse: 75  Resp: 18  Temp: 98 F (36.7 C)  TempSrc: Oral  SpO2: 99%   Weight: 166 lb 1.6 oz (75.3 kg)  Height: 4\' 11"  (1.499 m)    Body mass index is 33.55 kg/m.  Physical Exam  Constitutional: Patient appears well-developed and well-nourished. Obese No distress.  HEENT: head atraumatic, normocephalic, pupils equal and reactive to light,  neck supple Cardiovascular: Normal rate, regular rhythm and normal heart sounds.  No murmur heard. No BLE edema. Pulmonary/Chest: Effort normal and breath sounds normal. No respiratory distress. Abdominal: Soft.  There is no tenderness. Psychiatric: Patient has a normal mood and affect. behavior is normal. Judgment and thought content normal.  PHQ2/9: Depression screen [facility] 2/9 03/23/2020 12/22/2019 12/08/2019 11/11/2019 09/08/2019  Decreased Interest 1 1 0 3 0  Down, Depressed, Hopeless 0 3 0 0 0  PHQ - 2 Score 1  4 0 3 0  Altered sleeping 3 3 - 0 1  Tired, decreased energy 1 0 - 2 1  Change in appetite 3 3 - 0 0  Feeling bad or failure about yourself  0 0 - 0 0  Trouble concentrating 1 1 - 0 0  Moving slowly or fidgety/restless 0 0 - 0 0  Suicidal thoughts 0 0 - 0 0  PHQ-9 Score 9 11 - 5 2  Difficult doing work/chores Somewhat difficult Not difficult at all - Very difficult Not difficult at all  Some recent data might be hidden    phq 9 is positive   Fall Risk: Fall Risk  03/23/2020 12/22/2019 12/08/2019 11/11/2019 09/08/2019  Falls in the past year? 0 0 0 0 0  Number falls in past yr: 0 0 0 0 0  Injury with Fall? 0 0 0 0 0  Comment - - - - -    Functional Status Survey: Is the patient deaf or have difficulty hearing?: No Does the patient have difficulty seeing, even when wearing glasses/contacts?: No Does the patient have difficulty concentrating, remembering, or making decisions?: Yes Does the patient have difficulty walking or climbing stairs?: No Does the patient have difficulty dressing or bathing?: No Does the patient have difficulty doing errands alone such as visiting a doctor's office or  shopping?: No    Assessment & Plan  1. Chest tightness  - Ambulatory referral to Cardiology  2. 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   3. Need for Tdap vaccination  - Tdap vaccine greater than or equal to 7yo IM  4. Dyslipidemia   5. Vitamin D deficiency   6. Migraine without aura and without status migrainosus, not intractable  - SUMAtriptan (IMITREX) 100 MG tablet; Take 1 tablet (100 mg total) by mouth every 2 (two) hours as needed for migraine. May repeat in 2 hours if headache persists or recurs.  Dispense: 10 tablet; Refill: 0  7. Mild intermittent asthma in adult without complication   8. Psychophysiological insomnia  - traZODone (DESYREL) 50 MG tablet; Take 0.5-1 tablets (25-50 mg total) by mouth at bedtime as needed for sleep.  Dispense: 90 tablet; Refill: 1  9. Mood disorder (HCC)  - desvenlafaxine (PRISTIQ) 50 MG 24 hr tablet; Take 1 tablet (50 mg total) by mouth daily.  Dispense: 90 tablet; Refill: 1  10. Hot flashes  - desvenlafaxine (PRISTIQ) 50 MG 24 hr tablet; Take 1 tablet (50 mg total) by mouth daily.  Dispense: 90 tablet; Refill: 1

## 2020-03-23 ENCOUNTER — Other Ambulatory Visit: Payer: Self-pay

## 2020-03-23 ENCOUNTER — Other Ambulatory Visit: Payer: Self-pay | Admitting: Family Medicine

## 2020-03-23 ENCOUNTER — Ambulatory Visit (INDEPENDENT_AMBULATORY_CARE_PROVIDER_SITE_OTHER): Payer: No Typology Code available for payment source | Admitting: Family Medicine

## 2020-03-23 ENCOUNTER — Encounter: Payer: Self-pay | Admitting: Family Medicine

## 2020-03-23 VITALS — BP 128/72 | HR 75 | Temp 98.0°F | Resp 18 | Ht 59.0 in | Wt 166.1 lb

## 2020-03-23 DIAGNOSIS — R439 Unspecified disturbances of smell and taste: Secondary | ICD-10-CM

## 2020-03-23 DIAGNOSIS — Z23 Encounter for immunization: Secondary | ICD-10-CM

## 2020-03-23 DIAGNOSIS — J4520 Mild intermittent asthma, uncomplicated: Secondary | ICD-10-CM

## 2020-03-23 DIAGNOSIS — U099 Post covid-19 condition, unspecified: Secondary | ICD-10-CM | POA: Insufficient documentation

## 2020-03-23 DIAGNOSIS — R438 Other disturbances of smell and taste: Secondary | ICD-10-CM

## 2020-03-23 DIAGNOSIS — E785 Hyperlipidemia, unspecified: Secondary | ICD-10-CM

## 2020-03-23 DIAGNOSIS — E559 Vitamin D deficiency, unspecified: Secondary | ICD-10-CM

## 2020-03-23 DIAGNOSIS — R232 Flushing: Secondary | ICD-10-CM

## 2020-03-23 DIAGNOSIS — G43009 Migraine without aura, not intractable, without status migrainosus: Secondary | ICD-10-CM

## 2020-03-23 DIAGNOSIS — F39 Unspecified mood [affective] disorder: Secondary | ICD-10-CM

## 2020-03-23 DIAGNOSIS — R0789 Other chest pain: Secondary | ICD-10-CM

## 2020-03-23 DIAGNOSIS — F5104 Psychophysiologic insomnia: Secondary | ICD-10-CM

## 2020-03-23 MED ORDER — DESVENLAFAXINE SUCCINATE ER 50 MG PO TB24: 50.0000 mg | ORAL_TABLET | Freq: Every day | ORAL | 1 refills | Status: DC

## 2020-03-23 MED ORDER — SUMATRIPTAN SUCCINATE 100 MG PO TABS: 100.0000 mg | ORAL_TABLET | ORAL | 0 refills | Status: DC | PRN

## 2020-03-23 MED ORDER — TRAZODONE HCL 50 MG PO TABS: 25.0000 mg | ORAL_TABLET | Freq: Every evening | ORAL | 1 refills | Status: DC | PRN

## 2020-03-28 ENCOUNTER — Other Ambulatory Visit: Payer: Self-pay | Admitting: Obstetrics & Gynecology

## 2020-03-28 MED ORDER — CLONIDINE HCL 0.2 MG PO TABS
0.2000 mg | ORAL_TABLET | Freq: Two times a day (BID) | ORAL | 3 refills | Status: DC
Start: 2020-03-28 — End: 2020-03-28

## 2020-03-28 NOTE — Telephone Encounter (Signed)
Please advise 

## 2020-04-04 ENCOUNTER — Encounter: Payer: Self-pay | Admitting: Cardiology

## 2020-04-04 ENCOUNTER — Ambulatory Visit (INDEPENDENT_AMBULATORY_CARE_PROVIDER_SITE_OTHER): Payer: No Typology Code available for payment source | Admitting: Cardiology

## 2020-04-04 ENCOUNTER — Other Ambulatory Visit: Payer: Self-pay

## 2020-04-04 ENCOUNTER — Other Ambulatory Visit: Payer: Self-pay | Admitting: Cardiology

## 2020-04-04 VITALS — BP 110/78 | HR 0 | Ht 59.0 in | Wt 169.1 lb

## 2020-04-04 DIAGNOSIS — R072 Precordial pain: Secondary | ICD-10-CM

## 2020-04-04 DIAGNOSIS — E7800 Pure hypercholesterolemia, unspecified: Secondary | ICD-10-CM

## 2020-04-04 MED ORDER — METOPROLOL TARTRATE 50 MG PO TABS: 50.0000 mg | ORAL_TABLET | Freq: Once | ORAL | 0 refills | Status: DC

## 2020-04-04 NOTE — Patient Instructions (Signed)
Medication Instructions:  Your physician recommends that you continue on your current medications as directed. Please refer to the Current Medication list given to you today.  *If you need a refill on your cardiac medications before your next appointment, please call your pharmacy*   Lab Work: None ordered If you have labs (blood work) drawn today and your tests are completely normal, you will receive your results only by: Marland Kitchen MyChart Message (if you have MyChart) OR . A paper copy in the mail If you have any lab test that is abnormal or we need to change your treatment, we will call you to review the results.   Testing/Procedures:  1.  Your physician has requested that you have an echocardiogram. Echocardiography is a painless test that uses sound waves to create images of your heart. It provides your doctor with information about the size and shape of your heart and how well your heart's chambers and valves are working. This procedure takes approximately one hour. There are no restrictions for this procedure.   2.  Your physician has requested that you have cardiac CT. Cardiac computed tomography (CT) is a painless test that uses an x-ray machine to take clear, detailed pictures of your heart.   Your cardiac CT will be scheduled at:  [facility] [address] [place], [place] [address] [phone]  Please arrive 15 mins early for check-in and test prep.   Please follow these instructions carefully (unless otherwise directed):    On the Night Before the Test: . Be sure to Drink plenty of water. . Do not consume any caffeinated/decaffeinated beverages or chocolate 12 hours prior to your test. . Do not take any antihistamines 12 hours prior to your test.   On the Day of the Test: . Drink plenty of water until 1 hour prior to the test. . Do not eat any food 4 hours prior to the test. . You may take your regular medications prior  to the test.  . Take metoprolol (Lopressor) two hours prior to test. . FEMALES- please wear underwire-free bra if available        After the Test: . Drink plenty of water. . After receiving IV contrast, you may experience a mild flushed feeling. This is normal. . On occasion, you may experience a mild rash up to 24 hours after the test. This is not dangerous. If this occurs, you can take Benadryl 25 mg and increase your fluid intake. . If you experience trouble breathing, this can be serious. If it is severe call 911 IMMEDIATELY. If it is mild, please call our office. . If you take any of these medications: Glipizide/Metformin, Avandament, Glucavance, please do not take 48 hours after completing test unless otherwise instructed.   Once we have confirmed authorization from your insurance company, we will call you to set up a date and time for your test. Based on how quickly your insurance processes prior authorizations requests, please allow up to 4 weeks to be contacted for scheduling your Cardiac CT appointment. Be advised that routine Cardiac CT appointments could be scheduled as many as 8 weeks after your provider has ordered it.  For non-scheduling related questions, please contact the cardiac imaging nurse navigator should you have any questions/concerns: Marchia Bond, Cardiac Imaging Nurse Navigator Gordy Clement, Cardiac Imaging Nurse Navigator [facility] Direct Office Dial: [phone]   For scheduling needs, including cancellations and rescheduling, please call [place], [phone].  Follow-Up: At [facility], you and your health needs are our priority.  As part of our continuing mission to provide you with exceptional heart care, we have created designated Provider Care Teams.  These Care Teams include your primary Cardiologist (physician) and Advanced Practice Providers (APPs -  Physician Assistants and Nurse Practitioners) who all work  together to provide you with the care you need, when you need it.  We recommend signing up for the patient portal called "MyChart".  Sign up information is provided on this After Visit Summary.  MyChart is used to connect with patients for Virtual Visits (Telemedicine).  Patients are able to view lab/test results, encounter notes, upcoming appointments, etc.  Non-urgent messages can be sent to your provider as well.   To learn more about what you can do with MyChart, go to [email or web address].    Your next appointment:   5 week(s)  The format for your next appointment:   In Person  Provider:   Kate Sable, MD   Other Instructions

## 2020-04-04 NOTE — Progress Notes (Signed)
Cardiology Office Note:    Date:  04/04/2020   ID:  Shelby Henson, DOB 09-08-1970, MRN [id number]  PCP:  Steele Sizer, MD   [place]  Cardiologist:  Kate Sable, MD  Advanced Practice Provider:  No care team member to display Electrophysiologist:  None       Referring MD: Steele Sizer, MD   Chief Complaint  Patient presents with  . New Patient (Initial Visit)    Ref by Dr. Ancil Boozer for chest pain. Patient c/o burning/chest tightness and difficulty breathing since she had Covid in November 2021. Medications reviewed by the patient verbally.    Shelby Henson is a 50 y.o. female who is being seen today for the evaluation of chest pain at the request of Steele Sizer, MD.   History of Present Illness:    Shelby Henson is a 50 y.o. female with a hx of asthma, who presents due to chest pain.  Patient states having left-sided chest pressure and tightness associated with some shortness of breath ongoing over the past 3 months.  Symptoms are not related with exertion.  Symptoms do occur randomly and have stayed about the same over the past 3 months.  She denies any history of heart disease, states diagnosed with COVID-19 roughly 3 months ago prior to symptom onset.  Patient is very worried regarding symptoms.  She states symptoms are different from her typical asthma attack induced chest discomfort.  Past Medical History:  Diagnosis Date  . Anemia   . Asthma    well controlled  . Complication of anesthesia   . Depression   . GERD (gastroesophageal reflux disease)    occ  . Migraine   . Morbid obesity ([place])   . Osteoarthritis   . PONV (postoperative nausea and vomiting)    with c-section x 1    Past Surgical History:  Procedure Laterality Date  . ABDOMINAL HYSTERECTOMY    . ABLATION    . BREAST CYST ASPIRATION Left   . CESAREAN SECTION    . CESAREAN SECTION    . CESAREAN SECTION    . HIP SURGERY  10/2018  . LAPAROSCOPY  2018  . LEEP     . TOTAL HIP ARTHROPLASTY Right 11/12/2018   Procedure: TOTAL HIP ARTHROPLASTY ANTERIOR APPROACH;  Surgeon: Lovell Sheehan, MD;  Location: [facility] ORS;  Service: Orthopedics;  Laterality: Right;  . TOTAL VAGINAL HYSTERECTOMY      Current Medications: Current Meds  Medication Sig  . albuterol (PROVENTIL) (2.5 MG/3ML) 0.083% nebulizer solution USE ONE VIAL VIA NEBULIZER EVERY 6 HOURS AS NEEDED FOR WHEEZING OR SHORTNESS OF BREATH.  . budesonide (PULMICORT) 0.5 MG/2ML nebulizer solution TAKE 2 MLS BY NEBULIZATION 2 TIMES DAILY.  . cloNIDine (CATAPRES) 0.2 MG tablet Take 1 tablet (0.2 mg total) by mouth 2 (two) times daily.  . colchicine 0.6 MG tablet Take 1 tablet (0.6 mg total) by mouth daily.  Marland Kitchen desvenlafaxine (PRISTIQ) 50 MG 24 hr tablet Take 1 tablet (50 mg total) by mouth daily.  Marland Kitchen gabapentin (NEURONTIN) 300 MG capsule Take 300 mg by mouth daily.  . meloxicam (MOBIC) 15 MG tablet Take 15 mg by mouth daily.  . metoprolol tartrate (LOPRESSOR) 50 MG tablet Take 1 tablet (50 mg total) by mouth once for 1 dose. Take 2 hours prior to your CT scan.  . Oxycodone HCl 10 MG TABS Take 1 tablet by mouth in the morning, at noon, and at bedtime.  . SUMAtriptan (IMITREX) 100  MG tablet Take 1 tablet (100 mg total) by mouth every 2 (two) hours as needed for migraine. May repeat in 2 hours if headache persists or recurs.  . traZODone (DESYREL) 50 MG tablet Take 0.5-1 tablets (25-50 mg total) by mouth at bedtime as needed for sleep.     Allergies:   Celecoxib, Prednisone, Topamax [topiramate], Zolpidem, Codeine, and Penicillins   Social History   Socioeconomic History  . Marital status: Divorced    Spouse name: Not on file  . Number of children: 3  . Years of education: Not on file  . Highest education level: Not on file  Occupational History  . Not on file  Tobacco Use  . Smoking status: Never Smoker  . Smokeless tobacco: Never Used  Vaping Use  . Vaping Use: Never used  Substance and Sexual  Activity  . Alcohol use: Yes    Alcohol/week: 0.0 standard drinks    Comment: occasionally  . Drug use: No  . Sexual activity: Not Currently    Comment: boyfriend unable  Other Topics Concern  . Not on file  Social History Narrative  . Not on file   Social Determinants of Health   Financial Resource Strain: Not on file  Food Insecurity: Not on file  Transportation Needs: Not on file  Physical Activity: Not on file  Stress: Not on file  Social Connections: Not on file     Family History: The patient's family history includes Bipolar disorder in her brother; Depression in her mother. There is no history of Breast cancer.  ROS:   Please see the history of present illness.     All other systems reviewed and are negative.  EKGs/Labs/Other Studies Reviewed:    The following studies were reviewed today:   EKG:  EKG is  ordered today.  The ekg ordered today demonstrates normal sinus rhythm, normal ECG.  Recent Labs: 09/08/2019: ALT 24 12/08/2019: BUN 17; Creatinine, Ser 0.57; Hemoglobin 12.4; Platelets 252; Potassium 3.4; Sodium 138  Recent Lipid Panel    Component Value Date/Time   CHOL 194 09/08/2019 0955   CHOL 202 (H) 05/20/2015 0824   TRIG 114 09/08/2019 0955   HDL 52 09/08/2019 0955   HDL 35 (L) 05/20/2015 0824   CHOLHDL 3.7 09/08/2019 0955   VLDL 31 09/17/2018 0758   LDLCALC 120 (H) 09/08/2019 0955     Risk Assessment/Calculations:      Physical Exam:    VS:  BP 110/78 (BP Location: Right Arm, Patient Position: Sitting, Cuff Size: Normal)   Pulse (!) 0   Ht 4\' 11"  (1.499 m)   Wt 169 lb 2 oz (76.7 kg)   SpO2 98%   BMI 34.16 kg/m     Wt Readings from Last 3 Encounters:  04/04/20 169 lb 2 oz (76.7 kg)  03/23/20 166 lb 1.6 oz (75.3 kg)  12/22/19 170 lb 6.4 oz (77.3 kg)     GEN:  Well nourished, well developed in no acute distress HEENT: Normal NECK: No JVD; No carotid bruits LYMPHATICS: No lymphadenopathy CARDIAC: RRR, no murmurs, rubs,  gallops RESPIRATORY:  Clear to auscultation without rales, wheezing or rhonchi  ABDOMEN: Soft, non-tender, non-distended MUSCULOSKELETAL:  No edema; No deformity  SKIN: Warm and dry NEUROLOGIC:  Alert and oriented x 3 PSYCHIATRIC:  Normal affect   ASSESSMENT:    1. Precordial pain   2. Pure hypercholesterolemia    PLAN:    In order of problems listed above:  1. Precordial pain, risk factors of  hyperlipidemia.  Get echocardiogram, coronary CTA to evaluate presence of CAD. 2. Hyperlipidemia, 10-year ASCVD risk 1.0%.  Patient not in statin benefit group.  Recommend low-cholesterol diet.  Follow-up after echo and coronary CTA.    Medication Adjustments/Labs and Tests Ordered: Current medicines are reviewed at length with the patient today.  Concerns regarding medicines are outlined above.  Orders Placed This Encounter  Procedures  . CT CORONARY MORPH W/CTA COR W/SCORE W/CA W/CM &/OR WO/CM  . CT CORONARY FRACTIONAL FLOW RESERVE DATA PREP  . CT CORONARY FRACTIONAL FLOW RESERVE FLUID ANALYSIS  . EKG 12-Lead  . ECHOCARDIOGRAM COMPLETE   Meds ordered this encounter  Medications  . metoprolol tartrate (LOPRESSOR) 50 MG tablet    Sig: Take 1 tablet (50 mg total) by mouth once for 1 dose. Take 2 hours prior to your CT scan.    Dispense:  1 tablet    Refill:  0    Patient Instructions  Medication Instructions:  Your physician recommends that you continue on your current medications as directed. Please refer to the Current Medication list given to you today.  *If you need a refill on your cardiac medications before your next appointment, please call your pharmacy*   Lab Work: None ordered If you have labs (blood work) drawn today and your tests are completely normal, you will receive your results only by: Marland Kitchen MyChart Message (if you have MyChart) OR . A paper copy in the mail If you have any lab test that is abnormal or we need to change your treatment, we will call you to review  the results.   Testing/Procedures:  1.  Your physician has requested that you have an echocardiogram. Echocardiography is a painless test that uses sound waves to create images of your heart. It provides your doctor with information about the size and shape of your heart and how well your heart's chambers and valves are working. This procedure takes approximately one hour. There are no restrictions for this procedure.   2.  Your physician has requested that you have cardiac CT. Cardiac computed tomography (CT) is a painless test that uses an x-ray machine to take clear, detailed pictures of your heart.   Your cardiac CT will be scheduled at:  [facility] [address] [place], [place] [address] [phone]  Please arrive 15 mins early for check-in and test prep.   Please follow these instructions carefully (unless otherwise directed):    On the Night Before the Test: . Be sure to Drink plenty of water. . Do not consume any caffeinated/decaffeinated beverages or chocolate 12 hours prior to your test. . Do not take any antihistamines 12 hours prior to your test.   On the Day of the Test: . Drink plenty of water until 1 hour prior to the test. . Do not eat any food 4 hours prior to the test. . You may take your regular medications prior to the test.  . Take metoprolol (Lopressor) two hours prior to test. . FEMALES- please wear underwire-free bra if available        After the Test: . Drink plenty of water. . After receiving IV contrast, you may experience a mild flushed feeling. This is normal. . On occasion, you may experience a mild rash up to 24 hours after the test. This is not dangerous. If this occurs, you can take Benadryl 25 mg and increase your fluid intake. . If you experience trouble breathing, this can be  serious. If it is severe call 911 IMMEDIATELY. If it is mild, please call our office. . If you take any of these  medications: Glipizide/Metformin, Avandament, Glucavance, please do not take 48 hours after completing test unless otherwise instructed.   Once we have confirmed authorization from your insurance company, we will call you to set up a date and time for your test. Based on how quickly your insurance processes prior authorizations requests, please allow up to 4 weeks to be contacted for scheduling your Cardiac CT appointment. Be advised that routine Cardiac CT appointments could be scheduled as many as 8 weeks after your provider has ordered it.  For non-scheduling related questions, please contact the cardiac imaging nurse navigator should you have any questions/concerns: Marchia Bond, Cardiac Imaging Nurse Navigator Gordy Clement, Cardiac Imaging Nurse Navigator [facility] Direct Office Dial: [phone]   For scheduling needs, including cancellations and rescheduling, please call [place], [phone].    Follow-Up: At [facility], you and your health needs are our priority.  As part of our continuing mission to provide you with exceptional heart care, we have created designated Provider Care Teams.  These Care Teams include your primary Cardiologist (physician) and Advanced Practice Providers (APPs -  Physician Assistants and Nurse Practitioners) who all work together to provide you with the care you need, when you need it.  We recommend signing up for the patient portal called "MyChart".  Sign up information is provided on this After Visit Summary.  MyChart is used to connect with patients for Virtual Visits (Telemedicine).  Patients are able to view lab/test results, encounter notes, upcoming appointments, etc.  Non-urgent messages can be sent to your provider as well.   To learn more about what you can do with MyChart, go to [email or web address].    Your next appointment:   5 week(s)  The format for your next appointment:   In Person  Provider:    Kate Sable, MD   Other Instructions     Signed, Kate Sable, MD  04/04/2020 12:34 PM    [place]

## 2020-04-26 ENCOUNTER — Ambulatory Visit (INDEPENDENT_AMBULATORY_CARE_PROVIDER_SITE_OTHER): Payer: No Typology Code available for payment source

## 2020-04-26 ENCOUNTER — Other Ambulatory Visit: Payer: Self-pay

## 2020-04-26 DIAGNOSIS — R072 Precordial pain: Secondary | ICD-10-CM

## 2020-04-27 ENCOUNTER — Telehealth (HOSPITAL_COMMUNITY): Payer: Self-pay | Admitting: Emergency Medicine

## 2020-04-27 LAB — ECHOCARDIOGRAM COMPLETE
Area-P 1/2: 3.28 cm2
S' Lateral: 2.6 cm

## 2020-04-27 NOTE — Telephone Encounter (Signed)
Attempted to call patient regarding upcoming cardiac CT appointment. °Left message on voicemail with name and callback number °Britanee Vanblarcom RN Navigator Cardiac Imaging °[facility] °[phone] Office °[phone] Cell ° °

## 2020-04-28 ENCOUNTER — Ambulatory Visit
Admission: RE | Admit: 2020-04-28 | Discharge: 2020-04-28 | Disposition: A | Discharge status: Home / Self Care | Payer: No Typology Code available for payment source | Source: Ambulatory Visit | Attending: Cardiology | Admitting: Cardiology

## 2020-04-28 ENCOUNTER — Other Ambulatory Visit: Payer: Self-pay

## 2020-04-28 DIAGNOSIS — R072 Precordial pain: Secondary | ICD-10-CM

## 2020-04-28 LAB — POCT I-STAT CREATININE: Creatinine, Ser: 0.6 mg/dL (ref 0.44–1.00)

## 2020-04-28 MED ORDER — IOHEXOL 350 MG/ML SOLN
100.0000 mL | Freq: Once | INTRAVENOUS | Status: AC | PRN
  Administered 2020-04-28: 100 mL via INTRAVENOUS

## 2020-04-28 MED ORDER — NITROGLYCERIN 0.4 MG SL SUBL
0.8000 mg | SUBLINGUAL_TABLET | Freq: Once | SUBLINGUAL | Status: AC
  Administered 2020-04-28: 0.8 mg via SUBLINGUAL

## 2020-04-28 NOTE — Progress Notes (Signed)
Patient tolerated CT well. Drank water and ate peanut butter after. Vital signs stable encourage to drink water throughout day.Reasons explained and verbalized understanding. Ambulated steady gait.

## 2020-05-09 ENCOUNTER — Ambulatory Visit (INDEPENDENT_AMBULATORY_CARE_PROVIDER_SITE_OTHER): Payer: No Typology Code available for payment source | Admitting: Cardiology

## 2020-05-09 ENCOUNTER — Other Ambulatory Visit: Payer: Self-pay

## 2020-05-09 ENCOUNTER — Encounter: Payer: Self-pay | Admitting: Cardiology

## 2020-05-09 VITALS — BP 110/60 | HR 66 | Ht 59.0 in | Wt 167.0 lb

## 2020-05-09 DIAGNOSIS — E7800 Pure hypercholesterolemia, unspecified: Secondary | ICD-10-CM

## 2020-05-09 DIAGNOSIS — R072 Precordial pain: Secondary | ICD-10-CM

## 2020-05-09 NOTE — Progress Notes (Signed)
Cardiology Office Note:    Date:  05/09/2020   ID:  Violeta Gelinas, DOB February 24, 1970, MRN [id number]  PCP:  Steele Sizer, [place]  Cardiologist:  Kate Sable, MD  Advanced Practice Provider:  No care team member to display Electrophysiologist:  None       Referring MD: Steele Sizer, MD   Chief Complaint  Patient presents with  . Other    5 week follow up. Meds verbally with patient.     History of Present Illness:    Shelby Henson is a 50 y.o. female with a hx of asthma, who presents for follow-up.  She was last seen due to chest pain associated with shortness of breath.  Echo and coronary CTA was ordered to evaluate presence of CAD.  Patient still with occasional chest pressure which she describes as burning.  Not associated with exertion, ongoing for the past 6 months after being diagnosed with COVID-19.  Otherwise has no concerns at this time.   Past Medical History:  Diagnosis Date  . Anemia   . Asthma    well controlled  . Complication of anesthesia   . Depression   . GERD (gastroesophageal reflux disease)    occ  . Migraine   . Morbid obesity ([place])   . Osteoarthritis   . PONV (postoperative nausea and vomiting)    with c-section x 1    Past Surgical History:  Procedure Laterality Date  . ABDOMINAL HYSTERECTOMY    . ABLATION    . BREAST CYST ASPIRATION Left   . CESAREAN SECTION    . CESAREAN SECTION    . CESAREAN SECTION    . HIP SURGERY  10/2018  . LAPAROSCOPY  2018  . LEEP    . TOTAL HIP ARTHROPLASTY Right 11/12/2018   Procedure: TOTAL HIP ARTHROPLASTY ANTERIOR APPROACH;  Surgeon: Lovell Sheehan, MD;  Location: [facility] ORS;  Service: Orthopedics;  Laterality: Right;  . TOTAL VAGINAL HYSTERECTOMY      Current Medications: Current Meds  Medication Sig  . albuterol (PROVENTIL) (2.5 MG/3ML) 0.083% nebulizer solution USE ONE VIAL VIA NEBULIZER EVERY 6 HOURS AS NEEDED FOR WHEEZING OR SHORTNESS OF BREATH.  .  budesonide (PULMICORT) 0.5 MG/2ML nebulizer solution TAKE 2 MLS BY NEBULIZATION 2 TIMES DAILY.  . cloNIDine (CATAPRES) 0.2 MG tablet TAKE 1 TABLET BY MOUTH TWICE DAILY  . colchicine 0.6 MG tablet TAKE 1 TABLET BY MOUTH ONCE DAILY  . desvenlafaxine (PRISTIQ) 50 MG 24 hr tablet TAKE 1 TABLET BY MOUTH DAILY.  Marland Kitchen gabapentin (NEURONTIN) 300 MG capsule TAKE 1 CAPSULE BY MOUTH AT BEDTIME.  . meloxicam (MOBIC) 15 MG tablet TAKE 1 TABLET BY MOUTH ONCE DAILY  . metoprolol tartrate (LOPRESSOR) 50 MG tablet TAKE 1 TABLET (50 MG TOTAL) BY MOUTH ONCE FOR 1 DOSE. TAKE 2 HOURS PRIOR TO YOUR CT SCAN.  . SUMAtriptan (IMITREX) 100 MG tablet TAKE 1 TABLET BY MOUTH EVERY 2 HOURS AS NEEDED FOR MIGRAINE. MAY REPEAT IN 2 HOURS IF HEADACHE PERSISTS OR RECURS.  Marland Kitchen tiZANidine (ZANAFLEX) 4 MG tablet TAKE 1 TABLET BY MOUTH TWICE DAILY AS NEEDED FOR PAIN AND MUSCLE SPASM  . traZODone (DESYREL) 50 MG tablet TAKE 1/2-1 TABLET (25-50 MG TOTAL) BY MOUTH AT BEDTIME AS NEEDED FOR SLEEP.     Allergies:   Celecoxib, Prednisone, Topamax [topiramate], Zolpidem, Codeine, and Penicillins   Social History   Socioeconomic History  . Marital status: Divorced    Spouse name: Not on  file  . Number of children: 3  . Years of education: Not on file  . Highest education level: Not on file  Occupational History  . Not on file  Tobacco Use  . Smoking status: Never Smoker  . Smokeless tobacco: Never Used  Vaping Use  . Vaping Use: Never used  Substance and Sexual Activity  . Alcohol use: Yes    Alcohol/week: 0.0 standard drinks    Comment: occasionally  . Drug use: No  . Sexual activity: Not Currently    Comment: boyfriend unable  Other Topics Concern  . Not on file  Social History Narrative  . Not on file   Social Determinants of Health   Financial Resource Strain: Not on file  Food Insecurity: Not on file  Transportation Needs: Not on file  Physical Activity: Not on file  Stress: Not on file  Social Connections: Not on  file     Family History: The patient's family history includes Bipolar disorder in her brother; Depression in her mother. There is no history of Breast cancer.  ROS:   Please see the history of present illness.     All other systems reviewed and are negative.  EKGs/Labs/Other Studies Reviewed:    The following studies were reviewed today:   EKG:  EKG is  ordered today.  The ekg ordered today demonstrates normal sinus rhythm, normal ECG.  Recent Labs: 09/08/2019: ALT 24 12/08/2019: BUN 17; Hemoglobin 12.4; Platelets 252; Potassium 3.4; Sodium 138 04/28/2020: Creatinine, Ser 0.60  Recent Lipid Panel    Component Value Date/Time   CHOL 194 09/08/2019 0955   CHOL 202 (H) 05/20/2015 0824   TRIG 114 09/08/2019 0955   HDL 52 09/08/2019 0955   HDL 35 (L) 05/20/2015 0824   CHOLHDL 3.7 09/08/2019 0955   VLDL 31 09/17/2018 0758   LDLCALC 120 (H) 09/08/2019 0955     Risk Assessment/Calculations:      Physical Exam:    VS:  BP 110/60 (BP Location: Left Arm, Patient Position: Sitting, Cuff Size: Normal)   Pulse 66   Ht 4\' 11"  (1.499 m)   Wt 167 lb (75.8 kg)   SpO2 99%   BMI 33.73 kg/m     Wt Readings from Last 3 Encounters:  05/09/20 167 lb (75.8 kg)  04/04/20 169 lb 2 oz (76.7 kg)  03/23/20 166 lb 1.6 oz (75.3 kg)     GEN:  Well nourished, well developed in no acute distress HEENT: Normal NECK: No JVD; No carotid bruits LYMPHATICS: No lymphadenopathy CARDIAC: RRR, no murmurs, rubs, gallops RESPIRATORY:  Clear to auscultation without rales, wheezing or rhonchi  ABDOMEN: Soft, non-tender, non-distended MUSCULOSKELETAL:  No edema; No deformity  SKIN: Warm and dry NEUROLOGIC:  Alert and oriented x 3 PSYCHIATRIC:  Normal affect   ASSESSMENT:    1. Precordial pain   2. Pure hypercholesterolemia    PLAN:    In order of problems listed above:  1. Precordial pain, risk factors of hyperlipidemia.  Coronary CTA 03/2020.  Mild proximal LAD calcifications causing minimal  stenosis < 25%,  Calcium score of 73.5.  Echocardiogram showed normal systolic and diastolic function, EF 55 to 60%.  Etiology not cardiac, patient made aware of results.  Unsure of any symptoms of musculoskeletal versus GI as she complains of burning sensation.  Prilosec over-the-counter recommended.   2. Hyperlipidemia, not in statin benefit group.  Continue low-cholesterol diet.  Follow-up as needed   Medication Adjustments/Labs and Tests Ordered: Current medicines are reviewed  at length with the patient today.  Concerns regarding medicines are outlined above.  Orders Placed This Encounter  Procedures  . EKG 12-Lead   No orders of the defined types were placed in this encounter.   Patient Instructions  Medication Instructions:  Your physician recommends that you continue on your current medications as directed. Please refer to the Current Medication list given to you today.  *If you need a refill on your cardiac medications before your next appointment, please call your pharmacy*   Lab Work: None ordered If you have labs (blood work) drawn today and your tests are completely normal, you will receive your results only by: Marland Kitchen MyChart Message (if you have MyChart) OR . A paper copy in the mail If you have any lab test that is abnormal or we need to change your treatment, we will call you to review the results.   Testing/Procedures: None ordered   Follow-Up: At [facility], you and your health needs are our priority.  As part of our continuing mission to provide you with exceptional heart care, we have created designated Provider Care Teams.  These Care Teams include your primary Cardiologist (physician) and Advanced Practice Providers (APPs -  Physician Assistants and Nurse Practitioners) who all work together to provide you with the care you need, when you need it.  We recommend signing up for the patient portal called "MyChart".  Sign up information is provided on this After  Visit Summary.  MyChart is used to connect with patients for Virtual Visits (Telemedicine).  Patients are able to view lab/test results, encounter notes, upcoming appointments, etc.  Non-urgent messages can be sent to your provider as well.   To learn more about what you can do with MyChart, go to [email or web address].    Your next appointment:   Follow up as needed   The format for your next appointment:   In Person  Provider:   Kate Sable, MD   Other Instructions      Signed, Kate Sable, MD  05/09/2020 12:30 PM    [place]

## 2020-05-09 NOTE — Patient Instructions (Signed)

## 2020-06-06 ENCOUNTER — Other Ambulatory Visit: Payer: Self-pay

## 2020-06-06 MED ORDER — MELOXICAM 15 MG PO TABS
ORAL_TABLET | ORAL | 0 refills | Status: DC
  Filled 2020-06-06: qty 30, 30d supply, fill #0

## 2020-06-08 ENCOUNTER — Encounter: Payer: Self-pay | Admitting: Family Medicine

## 2020-06-13 ENCOUNTER — Other Ambulatory Visit: Payer: Self-pay

## 2020-06-13 MED FILL — Gabapentin Cap 300 MG: ORAL | 30 days supply | Qty: 30 | Fill #0 | Status: AC

## 2020-06-13 MED FILL — Trazodone HCl Tab 50 MG: ORAL | 90 days supply | Qty: 90 | Fill #0 | Status: AC

## 2020-06-13 MED FILL — Desvenlafaxine Succinate Tab ER 24HR 50 MG (Base Equiv): ORAL | 90 days supply | Qty: 90 | Fill #0 | Status: AC

## 2020-06-17 NOTE — Telephone Encounter (Signed)
Please schedule

## 2020-06-30 ENCOUNTER — Encounter: Payer: Self-pay | Admitting: Family Medicine

## 2020-07-04 ENCOUNTER — Ambulatory Visit: Payer: No Typology Code available for payment source | Admitting: Physical Therapy

## 2020-07-04 ENCOUNTER — Ambulatory Visit: Payer: No Typology Code available for payment source | Attending: Family Medicine | Admitting: Physical Therapy

## 2020-07-04 ENCOUNTER — Other Ambulatory Visit: Payer: Self-pay

## 2020-07-04 DIAGNOSIS — M6281 Muscle weakness (generalized): Secondary | ICD-10-CM | POA: Insufficient documentation

## 2020-07-04 DIAGNOSIS — R262 Difficulty in walking, not elsewhere classified: Secondary | ICD-10-CM | POA: Insufficient documentation

## 2020-07-04 DIAGNOSIS — M25551 Pain in right hip: Secondary | ICD-10-CM | POA: Insufficient documentation

## 2020-07-04 NOTE — Therapy (Signed)
[place] MAIN [facility] SERVICES [address] [place], [place], [address] Phone: [phone]   Fax:  [phone]  Patient Details  Name: Shelby Henson MRN: [id number] Date of Birth: [birth year] Referring Provider:  Steele Sizer, MD  Encounter Date: 07/04/2020  PT/OT/SLP Screening Form   Time: in__1148____     Time out__1205___   Complaint _Chronic right hip pain_________________ Past Medical Hx:  _s/p right THA October 2020_______________ Injury Date:________________________________  Pain Scale: __Current: 3-4/10__________ Patient's phone number:   Hx (this occurrence):  Patient is s/p RLE THA in October 2020 with continued right hip pain. She had outpatient PT from Oct 2020-March 2021 with some improvement, however she continues to have numbness along right anterior/lateral hip and chronic hip pain. She had a recent CT scan which was negative for loosening hardware. Going to gym 3x a week doing weighted squats (45#)- reports while she's doing them it feels good. Doing hip thrusts, leg press (80#), unable to do treadmill- has pain when walking; Tried 2.0 mph with increased pain; Reports pain after walking 2 min; Doesn't feel like she limps; Will have increased pain after doing a lot of walking;   She has not been stretching- "I don't remember what to do"   Assessment: Continues to have tightness in right anterior hip (hip flexors), reports moderate tenderness along right anterior/lateral hip  Strength grossly 4-5/5   Does exhibit mild unsteadiness with SLS on right compared to left, but able to hold for >5 sec;  Reports mild pain with hip scour but no pain with FABER/FADIR (-) SLR for pain but does have tightness in hamstring;     Recommendations:   She is scheduled to see neurologist this afternoon. Recommend patient continue with this She would benefit from short bout of PT to re-educate patient in LE exercise to improve hip strength  and flexibility. Patient has been consistent with gym exercise but is not doing hip exercise, rather focusing on quad/hamstring strengthening;   Comments:    [x]  Patient would benefit from an MD referral []  Patient would benefit from a full PT/OT/ SLP evaluation and treatment. []  No intervention recommended at this time.    Arra Connaughton  PT, DPT 07/04/2020, 1:07 PM  [place] MAIN [facility] SERVICES [address] [place], [place], [address] Phone: [phone]   Fax:  [phone]

## 2020-07-05 ENCOUNTER — Ambulatory Visit: Payer: No Typology Code available for payment source | Admitting: Physical Therapy

## 2020-07-06 ENCOUNTER — Other Ambulatory Visit: Payer: Self-pay | Admitting: Neurology

## 2020-07-06 ENCOUNTER — Other Ambulatory Visit (HOSPITAL_COMMUNITY): Payer: Self-pay | Admitting: Neurology

## 2020-07-06 DIAGNOSIS — G5711 Meralgia paresthetica, right lower limb: Secondary | ICD-10-CM

## 2020-07-12 ENCOUNTER — Other Ambulatory Visit: Payer: Self-pay

## 2020-07-12 MED FILL — Clonidine HCl Tab 0.2 MG: ORAL | 90 days supply | Qty: 180 | Fill #0 | Status: AC

## 2020-07-12 NOTE — Telephone Encounter (Signed)
Please advise 

## 2020-07-13 ENCOUNTER — Other Ambulatory Visit: Payer: Self-pay

## 2020-07-14 ENCOUNTER — Ambulatory Visit
Admission: RE | Admit: 2020-07-14 | Discharge: 2020-07-14 | Disposition: A | Discharge status: Home / Self Care | Payer: No Typology Code available for payment source | Source: Ambulatory Visit | Attending: Neurology | Admitting: Neurology

## 2020-07-14 ENCOUNTER — Other Ambulatory Visit: Payer: Self-pay

## 2020-07-14 ENCOUNTER — Ambulatory Visit: Payer: No Typology Code available for payment source

## 2020-07-14 DIAGNOSIS — G5711 Meralgia paresthetica, right lower limb: Secondary | ICD-10-CM | POA: Insufficient documentation

## 2020-07-18 ENCOUNTER — Ambulatory Visit: Payer: No Typology Code available for payment source | Admitting: Physical Therapy

## 2020-07-18 ENCOUNTER — Encounter: Payer: Self-pay | Admitting: Physical Therapy

## 2020-07-18 ENCOUNTER — Other Ambulatory Visit: Payer: Self-pay

## 2020-07-18 DIAGNOSIS — M25551 Pain in right hip: Secondary | ICD-10-CM

## 2020-07-18 DIAGNOSIS — M6281 Muscle weakness (generalized): Secondary | ICD-10-CM | POA: Diagnosis present

## 2020-07-18 DIAGNOSIS — R262 Difficulty in walking, not elsewhere classified: Secondary | ICD-10-CM | POA: Diagnosis present

## 2020-07-18 NOTE — Therapy (Signed)
[place] MAIN [facility] SERVICES [address] [place], [place], [address] Phone: [phone]   Fax:  [phone]  Physical Therapy Evaluation  Patient Details  Name: Shelby Henson MRN: [id number] Date of Birth: 07/28/1970 Referring Provider (PT): Dr. Kurtis Bushman   Encounter Date: 07/18/2020   PT End of Session - 07/18/20 1035     Visit Number 1    Number of Visits 9    Date for PT Re-Evaluation 08/15/20    PT Start Time 0805    PT Stop Time 0900    PT Time Calculation (min) 55 min    Activity Tolerance Patient tolerated treatment well;Patient limited by pain    Behavior During Therapy [facility] for tasks assessed/performed             Past Medical History:  Diagnosis Date   Anemia    Asthma    well controlled   Complication of anesthesia    Depression    GERD (gastroesophageal reflux disease)    occ   Migraine    Morbid obesity ([place])    Osteoarthritis    PONV (postoperative nausea and vomiting)    with c-section x 1    Past Surgical History:  Procedure Laterality Date   ABDOMINAL HYSTERECTOMY     ABLATION     BREAST CYST ASPIRATION Left    CESAREAN SECTION     [place]  10/2018   LAPAROSCOPY  2018   LEEP     TOTAL HIP ARTHROPLASTY Right 11/12/2018   Procedure: TOTAL HIP ARTHROPLASTY ANTERIOR APPROACH;  Surgeon: Lovell Sheehan, MD;  Location: [facility] ORS;  Service: Orthopedics;  Laterality: Right;   TOTAL VAGINAL HYSTERECTOMY      There were no vitals filed for this visit.    Subjective Assessment - 07/18/20 0810     Subjective "My hip is still bothering me."    Pertinent History 50 yo Patient is s/p RLE THA in October 2020 with continued right hip pain. She had outpatient PT from Oct 2020-March 2021 with some improvement, however she continues to have numbness along right anterior/lateral hip and chronic hip pain.  She had a recent CT scan which was negative for loosening  hardware.  Going to gym 3x a week doing weighted squats (45#)- reports while she's doing them it feels good.  Doing hip thrusts, leg press (80#), unable to do treadmill- has pain when walking; Tried 2.0 mph with increased pain; Reports pain after walking 2 min; Doesn't feel like she limps; Will have increased pain after doing a lot of walking; She did see neurologist who diagnosed her with meralgia parasthetica. She did get MRI of lumbar spine.    Limitations Standing;Walking;Sitting    How long can you sit comfortably? 1 hour    How long can you stand comfortably? 20 min;    How long can you walk comfortably? unable to walk on treadmill; 30 min overground walking, limited to short distances;    Diagnostic tests MRI of lumbar spine shows mild right subarticular stenosis at L4-L5; otherwise unremarkable.    Patient Stated Goals "I want strength in my legs"    Currently in Pain? Yes    Pain Score 3     Pain Location Hip    Pain Orientation Right;Anterior;Lateral    Pain Descriptors / Indicators Tightness;Aching;Dull;Burning;Tingling    Pain Type Chronic pain    Pain Onset More than  a month ago    Pain Frequency Intermittent    Aggravating Factors  worse with prolonged sitting/standing/walking;    Pain Relieving Factors rest/stretch    Effect of Pain on Daily Activities decreased activity tolerance;    Multiple Pain Sites No                [facility] PT Assessment - 07/18/20 0001       Assessment   Medical Diagnosis RLE hip pain    Referring Provider (PT) Dr. Kurtis Bushman    Onset Date/Surgical Date 10/30/18    Hand Dominance Right    Next MD Visit none scheduled    Prior Therapy had outpatient PT following surgery: Oct 2020-March 2021 with some improvement; none recent      Precautions   Precautions None    Required Braces or Orthoses --   none     Restrictions   Weight Bearing Restrictions No      Balance Screen   Has the patient fallen in the past 6 months Yes    How many  times? 1   fell down stairs in March 2022   Has the patient had a decrease in activity level because of a fear of falling?  No    Is the patient reluctant to leave their home because of a fear of falling?  No      Home Environment   Additional Comments has 5 steps to enter home, single story home; has tub shower; does have mat in shower;      Prior Function   Level of Independence Independent with basic ADLs    Vocation Full time employment    Leisure going to the gym; can't have fun because of pain levels      Cognition   Overall Cognitive Status Within Functional Limits for tasks assessed      Observation/Other Assessments   Observations Thomas test: LLE: 62 degrees knee flexed with leg flat on table; RLE: compensates with rotation to left, rotate to neutral, RLE off table, knee flexed at 55 degrees with tightness in anterior hip;    Focus on Therapeutic Outcomes (FOTO)  48%      Sensation   Light Touch --   has numbness along right lateral thigh; intact elsewhere     Coordination   Gross Motor Movements are Fluid and Coordinated Yes    Fine Motor Movements are Fluid and Coordinated Yes    Heel Shin Test accurate bilaterally      Posture/Postural Control   Posture Comments WFL      ROM / Strength   AROM / PROM / Strength AROM;Strength;PROM      AROM   Overall AROM Comments BUE and BLE are WFL      PROM   Overall PROM Comments BLE hip IR- WFL, minimal tightness reported on left, none on right; ER is mildly tight but not significant      Strength   Strength Assessment Site Hip    Right/Left Hip Right;Left    Right Hip Flexion 4/5    Right Hip Extension 4-/5    Right Hip ABduction 4/5    Right Hip ADduction 3/5    Left Hip Flexion 4+/5    Left Hip Extension 4/5    Left Hip ABduction 4+/5    Left Hip ADduction 3-/5      Palpation   Palpation comment reports moderate tenderness along right anterior/lateral hip      Special Tests  Special Tests Hip Special Tests     Hip Special Tests  Marcello Moores Test;Hip Scouring;Patrick (FABER) Test;Anterior Hip Impingement Test      Saralyn Pilar [facility]) Test   Findings Positive    Side Right    Comments was negative a few weeks ago, but reporting pain today      Thomas Test    Findings Positive    Side Right      Hip Scouring   Findings Negative    Side Right      Anterior Hip Impingement Test    Findings Positive    Side  Right    Comments catching and sharp pain with hip flexed and adducted with significant pain during IR      Transfers   Comments able to transfer sit<>Stand with arms across chest, however does lean towards left side      Ambulation/Gait   Gait Comments Pt ambulates with reciprocal gait pattern, no abnormalities noted;      Standardized Balance Assessment   Standardized Balance Assessment Five Times Sit to Stand;10 meter walk test    Five times sit to stand comments  7.81 sec with arms across chest (low risk for falls)    10 Meter Walk 1.03 m/s without AD, limited community ambulator      High Level Balance   High Level Balance Comments able to hold SLS for >15 sec each LE, however does report increased pain in groin when taking weight off right leg                        Objective measurements completed on examination: See above findings.   TREATMENT:  Initiated HEP: Instructed patient in right anterior hip stretch   1/2 kneel lunge 30 sec hold Standing lunge 30 sec hold  Seated RLE piriformis stretch with min VCs for leaning forward 30 sec hold x2 reps;  Educated patient in proper positioning and timing of LE stretches; Pt verbalized understanding;             PT Education - 07/18/20 1032     Education Details LE stretches, HEP    Person(s) Educated Patient    Methods Explanation;Verbal cues;Handout    Comprehension Verbalized understanding;Verbal cues required;Need further instruction              PT Short Term Goals - 07/18/20 1055        PT SHORT TERM GOAL #1   Title Patient will be independent and adherent with HEP at least 3x a week for better LE flexibility and strength;    Time 2    Period Weeks    Status New    Target Date 08/01/20               PT Long Term Goals - 07/18/20 1055       PT LONG TERM GOAL #1   Title Patient will improve flexibility in right anterior hip as evidenced with knee flexion > 65 degrees during thomas test to improve LE flexibility;    Time 4    Period Weeks    Status New    Target Date 08/15/20      PT LONG TERM GOAL #2   Title Patient will improve FOTO score to >65% to indicate improved functional mobility with ADLs.    Time 4    Period Weeks    Status New    Target Date 08/15/20      PT LONG TERM  GOAL #3   Title Pt will improve her R hip strength to a 4+/5 in order to improve her ability to perform ADLs and IADLs with less difficulty and to improve her quality of life.    Time 4    Period Weeks    Status New    Target Date 08/15/20      PT LONG TERM GOAL #4   Title Patient will improve 10 meter walk speed to >1.2 m/s to achieve community ambulatory status and reduce fall risk.    Time 4    Period Weeks    Status New    Target Date 08/15/20      PT LONG TERM GOAL #5   Title Patient will report a worst pain of 5/10 in right hip over last week to indicate improved tolerance with ADLs.    Time 4    Period Weeks    Status New    Target Date 08/15/20                    Plan - 07/18/20 1037     Clinical Impression Statement 50 yo Female reports continued RLE chronic hip pain. She is s/p RLE THA (anterior approach) in October 2020. She did receive extensive outpatient PT rehab from Oct-March 2021 with some improvement. However she continues to have hip pain. Patient has been going to the gym but admits she isn't sure what she is supposed to be doing to improve LE hip strength and flexibility. Upon discussion, she is focusing on quad/hamstring strengthening.  Patient does exhibit tightness in right anterior hip as evidenced with (+) thomas test. She also tested positive this session for FABER and FADIR. Patient has intermittent pain which is worse with prolonged standing/walking. She reports her pain is worse when she unweights her leg. Patient does exhibit mild weakness in RLE. She would benefit from skilled PT Intervention to re-educate patient in LE stretches and strengthening exercise to improve RLE ROM and stability.    Personal Factors and Comorbidities Comorbidity 3+    Comorbidities arthritis, BMI >30, depression, GERD    Examination-Activity Limitations Squat;Stairs;Stand;Locomotion Level;Bend;Transfers    Examination-Participation Restrictions Shop;Community Activity;Yard Work    Stability/Clinical Decision Making Stable/Uncomplicated    Clinical Decision Making Low    PT Frequency 2x / week    PT Duration 4 weeks    PT Treatment/Interventions Cryotherapy;Electrical Stimulation;Moist Heat;Gait training;Stair training;Functional mobility training;Therapeutic activities;Therapeutic exercise;Balance training;Neuromuscular re-education;Patient/family education;Manual techniques;Passive range of motion;Dry needling;Energy conservation    PT Next Visit Plan advance HEP with LE ROM and strengthening, focusing on hip and things to do at gym    PT Home Exercise Plan initiated- see patient instructions;    Consulted and Agree with Plan of Care Patient             Patient will benefit from skilled therapeutic intervention in order to improve the following deficits and impairments:  Decreased endurance, Difficulty walking, Hypomobility, Decreased range of motion, Decreased activity tolerance, Decreased strength, Pain  Visit Diagnosis: Pain in right hip  Muscle weakness (generalized)     Problem List Patient Active Problem List   Diagnosis Date Noted   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 03/23/2020   Pain in  right hip 01/12/2019   Vitamin D deficiency 02/25/2018   Chronic pain 10/24/2015   Hot flashes 10/24/2015   Migraine without aura and without status migrainosus, not intractable 83/15/1761   Metabolic syndrome 60/73/7106   Dyslipidemia 10/24/2015  Allergic rhinitis, seasonal 05/17/2015   Asthma, mild intermittent 08/08/2014   Chronic insomnia 08/08/2014   DDD (degenerative disc disease), lumbar 08/08/2014   Mood disorder ([place]) 08/08/2014   Gastro-esophageal reflux disease without esophagitis 08/08/2014   H/O total hysterectomy 08/08/2014   Irritable bowel syndrome with diarrhea 08/08/2014   Osteoarthritis of right hip 08/08/2014   Adult BMI 30+ 08/08/2014   SBO (spina bifida occulta) 08/08/2014    Ellen Goris PT, DPT 07/18/2020, 10:59 AM  [place], [place], [address] Phone: [phone]   Fax:  [phone]  Name: EDUARDO WURTH MRN: [id number] Date of Birth: [birth year]

## 2020-07-19 ENCOUNTER — Other Ambulatory Visit: Payer: Self-pay

## 2020-07-20 ENCOUNTER — Other Ambulatory Visit: Payer: Self-pay

## 2020-07-20 MED ORDER — GABAPENTIN 300 MG PO CAPS
ORAL_CAPSULE | ORAL | 3 refills | Status: DC
  Filled 2020-07-20: qty 180, 90d supply, fill #0

## 2020-07-21 ENCOUNTER — Other Ambulatory Visit: Payer: Self-pay

## 2020-07-21 ENCOUNTER — Ambulatory Visit: Payer: No Typology Code available for payment source

## 2020-07-21 DIAGNOSIS — R262 Difficulty in walking, not elsewhere classified: Secondary | ICD-10-CM

## 2020-07-21 DIAGNOSIS — M25551 Pain in right hip: Secondary | ICD-10-CM | POA: Diagnosis not present

## 2020-07-21 DIAGNOSIS — M6281 Muscle weakness (generalized): Secondary | ICD-10-CM

## 2020-07-21 NOTE — Therapy (Signed)
[place] MAIN [facility] SERVICES [address] [place], [place], [address] Phone: [phone]   Fax:  [phone]  Physical Therapy Treatment  Patient Details  Name: Shelby Henson MRN: [id number] Date of Birth: 1970/07/21 Referring Provider (PT): Dr. Kurtis Bushman   Encounter Date: 07/21/2020   PT End of Session - 07/21/20 0714     Visit Number 2    Number of Visits 9    Date for PT Re-Evaluation 08/15/20    PT Start Time 0715    PT Stop Time 5284    PT Time Calculation (min) 42 min    Activity Tolerance Patient tolerated treatment well;Patient limited by pain    Behavior During Therapy [facility] for tasks assessed/performed             Past Medical History:  Diagnosis Date   Anemia    Asthma    well controlled   Complication of anesthesia    Depression    GERD (gastroesophageal reflux disease)    occ   Migraine    Morbid obesity ([place])    Osteoarthritis    PONV (postoperative nausea and vomiting)    with c-section x 1    Past Surgical History:  Procedure Laterality Date   ABDOMINAL HYSTERECTOMY     ABLATION     BREAST CYST ASPIRATION Left    CESAREAN SECTION     [place]  10/2018   LAPAROSCOPY  2018   LEEP     TOTAL HIP ARTHROPLASTY Right 11/12/2018   Procedure: TOTAL HIP ARTHROPLASTY ANTERIOR APPROACH;  Surgeon: Lovell Sheehan, MD;  Location: [facility] ORS;  Service: Orthopedics;  Laterality: Right;   TOTAL VAGINAL HYSTERECTOMY      There were no vitals filed for this visit.   Subjective Assessment - 07/21/20 1337     Subjective ?Patient reports pain is a 2/10, as been doing stretches and reports it feels good.    Pertinent History 50 yo Patient is s/p RLE THA in October 2020 with continued right hip pain. She had outpatient PT from Oct 2020-March 2021 with some improvement, however she continues to have numbness along right anterior/lateral hip and chronic hip pain.  She had a  recent CT scan which was negative for loosening hardware.  Going to gym 3x a week doing weighted squats (45#)- reports while she's doing them it feels good.  Doing hip thrusts, leg press (80#), unable to do treadmill- has pain when walking; Tried 2.0 mph with increased pain; Reports pain after walking 2 min; Doesn't feel like she limps; Will have increased pain after doing a lot of walking; She did see neurologist who diagnosed her with meralgia parasthetica. She did get MRI of lumbar spine.    Limitations Standing;Walking;Sitting    How long can you sit comfortably? 1 hour    How long can you stand comfortably? 20 min;    How long can you walk comfortably? unable to walk on treadmill; 30 min overground walking, limited to short distances;    Diagnostic tests MRI of lumbar spine shows mild right subarticular stenosis at L4-L5; otherwise unremarkable.    Patient Stated Goals "I want strength in my legs"    Currently in Pain? Yes    Pain Score 2     Pain Location Hip    Pain Orientation Right    Pain Descriptors / Indicators Aching    Pain Type Chronic  pain    Pain Onset More than a month ago    Pain Frequency Intermittent                  Manual Therapy  R single knee to chest stretch 30s x 2; R FABER stretch 30s x 2; R pirifromis stretch, performed with hip below 90 due to groin pain when attempted above 90, 30s x 2; R hip adductor stretch (moving into abduction) x 30s; R HS stretch 30s x 2; L sidelying R hip flexor stretch 30s x 2; Prone R quad stretch with towel under distal aspect of knee 60 seconds x4 trials  Roller to to R lateral and anterolateral hip as well as lateral thigh (over IT band), pt reports no irritation of her pain;    TherEx: Bridge:10x; 2 sets ; cues for posterior pelvic tilt  Posterior pelvic tilt with adduction ball squeeze 10x 3 second holds Clamshells 15x; 2 sets cues for pain free range Hip extension in sidelying 15x; with stabilization to foot  provided 2 sets  Prone hamstring curls 10x; 2 sets; towel under distal aspect of knee  prone froggers 10x; 2 sets  standing:  -IT band stretch 2 x45 second holds -hip flexor lunge stretch straight leg 45 seconds, bent knee 45 seconds -hip flexor standing stretch 45 seconds -squat with focus on abducting knees and cue for toe curl for posterior chain activation x 8.      Pt educated throughout session about proper posture and technique with exercises. Improved exercise technique, movement at target joints, use of target muscles after min to mod verbal, visual, tactile cues.    Patient is highly motivated throughout physical therapy session. Posterior chain strengthening combined with anterior chain lengthening is pain relieving to patient indicating possible progress in correct direction. Will assess how patient tolerated session the following day next session to determine if this will be course for therapy. She would benefit from skilled PT Intervention to re-educate patient in LE stretches and strengthening exercise to improve RLE ROM and stability                   PT Education - 07/21/20 0714     Education Details exercise technique, body mechanics    Person(s) Educated Patient    Methods Explanation;Demonstration;Tactile cues;Verbal cues    Comprehension Verbalized understanding;Returned demonstration;Verbal cues required;Tactile cues required              PT Short Term Goals - 07/18/20 1055       PT SHORT TERM GOAL #1   Title Patient will be independent and adherent with HEP at least 3x a week for better LE flexibility and strength;    Time 2    Period Weeks    Status New    Target Date 08/01/20               PT Long Term Goals - 07/18/20 1055       PT LONG TERM GOAL #1   Title Patient will improve flexibility in right anterior hip as evidenced with knee flexion > 65 degrees during thomas test to improve LE flexibility;    Time 4    Period Weeks     Status New    Target Date 08/15/20      PT LONG TERM GOAL #2   Title Patient will improve FOTO score to >65% to indicate improved functional mobility with ADLs.    Time 4    Period Weeks  Status New    Target Date 08/15/20      PT LONG TERM GOAL #3   Title Pt will improve her R hip strength to a 4+/5 in order to improve her ability to perform ADLs and IADLs with less difficulty and to improve her quality of life.    Time 4    Period Weeks    Status New    Target Date 08/15/20      PT LONG TERM GOAL #4   Title Patient will improve 10 meter walk speed to >1.2 m/s to achieve community ambulatory status and reduce fall risk.    Time 4    Period Weeks    Status New    Target Date 08/15/20      PT LONG TERM GOAL #5   Title Patient will report a worst pain of 5/10 in right hip over last week to indicate improved tolerance with ADLs.    Time 4    Period Weeks    Status New    Target Date 08/15/20                   Plan - 07/21/20 1338     Clinical Impression Statement Patient is highly motivated throughout physical therapy session. Posterior chain strengthening combined with anterior chain lengthening is pain relieving to patient indicating possible progress in correct direction. Will assess how patient tolerated session the following day next session to determine if this will be course for therapy. She would benefit from skilled PT Intervention to re-educate patient in LE stretches and strengthening exercise to improve RLE ROM and stability    Personal Factors and Comorbidities Comorbidity 3+    Comorbidities arthritis, BMI >30, depression, GERD    Examination-Activity Limitations Squat;Stairs;Stand;Locomotion Level;Bend;Transfers    Examination-Participation Restrictions Shop;Community Activity;Yard Work    Stability/Clinical Decision Making Stable/Uncomplicated    PT Frequency 2x / week    PT Duration 4 weeks    PT Treatment/Interventions Cryotherapy;Electrical  Stimulation;Moist Heat;Gait training;Stair training;Functional mobility training;Therapeutic activities;Therapeutic exercise;Balance training;Neuromuscular re-education;Patient/family education;Manual techniques;Passive range of motion;Dry needling;Energy conservation    PT Next Visit Plan advance HEP with LE ROM and strengthening, focusing on hip and things to do at gym    PT Home Exercise Plan initiated- see patient instructions;    Consulted and Agree with Plan of Care Patient             Patient will benefit from skilled therapeutic intervention in order to improve the following deficits and impairments:  Decreased endurance, Difficulty walking, Hypomobility, Decreased range of motion, Decreased activity tolerance, Decreased strength, Pain  Visit Diagnosis: Pain in right hip  Difficulty in walking, not elsewhere classified  Muscle weakness (generalized)     Problem List Patient Active Problem List   Diagnosis Date Noted   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 03/23/2020   Pain in right hip 01/12/2019   Vitamin D deficiency 02/25/2018   Chronic pain 10/24/2015   Hot flashes 10/24/2015   Migraine without aura and without status migrainosus, not intractable 40/81/4481   Metabolic syndrome 85/63/1497   Dyslipidemia 10/24/2015   Allergic rhinitis, seasonal 05/17/2015   Asthma, mild intermittent 08/08/2014   Chronic insomnia 08/08/2014   DDD (degenerative disc disease), lumbar 08/08/2014   Mood disorder ([place]) 08/08/2014   Gastro-esophageal reflux disease without esophagitis 08/08/2014   H/O total hysterectomy 08/08/2014   Irritable bowel syndrome with diarrhea 08/08/2014   Osteoarthritis of right hip 08/08/2014   Adult BMI  30+ 08/08/2014   SBO (spina bifida occulta) 08/08/2014    Janna Arch, PT, DPT  07/21/2020, 1:41 PM  [place] MAIN [facility] SERVICES [address] [place], [place], [address] Phone:  [phone]   Fax:  [phone]  Name: EVVA DIN MRN: [id number] Date of Birth: Dec 11, 1970

## 2020-07-25 ENCOUNTER — Ambulatory Visit: Payer: No Typology Code available for payment source

## 2020-07-28 ENCOUNTER — Ambulatory Visit (INDEPENDENT_AMBULATORY_CARE_PROVIDER_SITE_OTHER): Payer: No Typology Code available for payment source | Admitting: Family Medicine

## 2020-07-28 ENCOUNTER — Ambulatory Visit: Payer: No Typology Code available for payment source

## 2020-07-28 ENCOUNTER — Other Ambulatory Visit: Payer: Self-pay

## 2020-07-28 ENCOUNTER — Encounter: Payer: Self-pay | Admitting: Family Medicine

## 2020-07-28 VITALS — BP 112/62 | HR 87 | Temp 98.2°F | Resp 16 | Ht 59.0 in | Wt 172.0 lb

## 2020-07-28 DIAGNOSIS — E8881 Metabolic syndrome: Secondary | ICD-10-CM

## 2020-07-28 DIAGNOSIS — M25551 Pain in right hip: Secondary | ICD-10-CM

## 2020-07-28 DIAGNOSIS — M6281 Muscle weakness (generalized): Secondary | ICD-10-CM

## 2020-07-28 DIAGNOSIS — J4520 Mild intermittent asthma, uncomplicated: Secondary | ICD-10-CM | POA: Diagnosis not present

## 2020-07-28 DIAGNOSIS — E785 Hyperlipidemia, unspecified: Secondary | ICD-10-CM

## 2020-07-28 DIAGNOSIS — R1013 Epigastric pain: Secondary | ICD-10-CM

## 2020-07-28 DIAGNOSIS — R232 Flushing: Secondary | ICD-10-CM | POA: Diagnosis not present

## 2020-07-28 DIAGNOSIS — F39 Unspecified mood [affective] disorder: Secondary | ICD-10-CM

## 2020-07-28 DIAGNOSIS — F5101 Primary insomnia: Secondary | ICD-10-CM

## 2020-07-28 DIAGNOSIS — G43009 Migraine without aura, not intractable, without status migrainosus: Secondary | ICD-10-CM

## 2020-07-28 DIAGNOSIS — G8929 Other chronic pain: Secondary | ICD-10-CM

## 2020-07-28 DIAGNOSIS — E559 Vitamin D deficiency, unspecified: Secondary | ICD-10-CM

## 2020-07-28 DIAGNOSIS — R262 Difficulty in walking, not elsewhere classified: Secondary | ICD-10-CM

## 2020-07-28 DIAGNOSIS — E669 Obesity, unspecified: Secondary | ICD-10-CM

## 2020-07-28 MED ORDER — PANTOPRAZOLE SODIUM 40 MG PO TBEC
40.0000 mg | DELAYED_RELEASE_TABLET | Freq: Every day | ORAL | 0 refills | Status: DC
  Filled 2020-07-28: qty 30, 30d supply, fill #0

## 2020-07-28 MED ORDER — QUVIVIQ 50 MG PO TABS: 1.0000 | ORAL_TABLET | Freq: Every evening | ORAL | 0 refills | Status: DC

## 2020-07-28 MED ORDER — MELOXICAM 15 MG PO TABS
ORAL_TABLET | ORAL | 1 refills | Status: DC
  Filled 2020-07-28: qty 90, 90d supply, fill #0
  Filled 2020-11-09: qty 90, 90d supply, fill #1

## 2020-07-28 MED ORDER — BUDESONIDE 0.5 MG/2ML IN SUSP
RESPIRATORY_TRACT | 5 refills | Status: DC
Start: 2020-07-28 — End: 2021-01-27
  Filled 2020-07-28: qty 60, 15d supply, fill #0

## 2020-07-28 MED ORDER — DESVENLAFAXINE SUCCINATE ER 50 MG PO TB24
ORAL_TABLET | Freq: Every day | ORAL | 1 refills | Status: DC
  Filled 2020-07-28 – 2020-09-28 (×2): qty 90, 90d supply, fill #0

## 2020-07-28 MED ORDER — ALBUTEROL SULFATE (2.5 MG/3ML) 0.083% IN NEBU
INHALATION_SOLUTION | RESPIRATORY_TRACT | 2 refills | Status: DC
  Filled 2020-07-28: qty 75, 6d supply, fill #0
  Filled 2021-01-16: qty 75, 6d supply, fill #1
  Filled 2021-03-29: qty 75, 6d supply, fill #2

## 2020-07-28 NOTE — Therapy (Signed)
[place] MAIN [facility] SERVICES [address] [place], [place], [address] Phone: [phone]   Fax:  [phone]  Physical Therapy Treatment  Patient Details  Name: Shelby Henson MRN: [id number] Date of Birth: [birth year] Referring Provider (PT): Dr. Kurtis Bushman   Encounter Date: 07/28/2020   PT End of Session - 07/28/20 0710     Visit Number 3    Number of Visits 9    Date for PT Re-Evaluation 08/15/20    PT Start Time 0714    PT Stop Time 0756    PT Time Calculation (min) 42 min    Activity Tolerance Patient tolerated treatment well;Patient limited by pain    Behavior During Therapy [facility] for tasks assessed/performed             Past Medical History:  Diagnosis Date   Anemia    Asthma    well controlled   Complication of anesthesia    Depression    GERD (gastroesophageal reflux disease)    occ   Migraine    Morbid obesity ([place])    Osteoarthritis    PONV (postoperative nausea and vomiting)    with c-section x 1    Past Surgical History:  Procedure Laterality Date   ABDOMINAL HYSTERECTOMY     ABLATION     BREAST CYST ASPIRATION Left    CESAREAN SECTION     [place]  10/2018   LAPAROSCOPY  2018   LEEP     TOTAL HIP ARTHROPLASTY Right 11/12/2018   Procedure: TOTAL HIP ARTHROPLASTY ANTERIOR APPROACH;  Surgeon: Lovell Sheehan, MD;  Location: [facility] ORS;  Service: Orthopedics;  Laterality: Right;   TOTAL VAGINAL HYSTERECTOMY      There were no vitals filed for this visit.   Subjective Assessment - 07/28/20 0711     Subjective Patient missed last session due to arriving late from a trip.    Pertinent History 50 yo Patient is s/p RLE THA in October 2020 with continued right hip pain. She had outpatient PT from Oct 2020-March 2021 with some improvement, however she continues to have numbness along right anterior/lateral hip and chronic hip pain.  She had a recent CT scan which was  negative for loosening hardware.  Going to gym 3x a week doing weighted squats (45#)- reports while she's doing them it feels good.  Doing hip thrusts, leg press (80#), unable to do treadmill- has pain when walking; Tried 2.0 mph with increased pain; Reports pain after walking 2 min; Doesn't feel like she limps; Will have increased pain after doing a lot of walking; She did see neurologist who diagnosed her with meralgia parasthetica. She did get MRI of lumbar spine.    Limitations Standing;Walking;Sitting    How long can you sit comfortably? 1 hour    How long can you stand comfortably? 20 min;    How long can you walk comfortably? unable to walk on treadmill; 30 min overground walking, limited to short distances;    Diagnostic tests MRI of lumbar spine shows mild right subarticular stenosis at L4-L5; otherwise unremarkable.    Patient Stated Goals "I want strength in my legs"    Currently in Pain? Yes    Pain Score 2     Pain Location Hip    Pain Orientation Right    Pain Descriptors / Indicators Aching    Pain Type Chronic pain  Pain Onset More than a month ago    Pain Frequency Intermittent                  Manual Therapy  R single knee to chest stretch 30s x 2; R FABER stretch 30s x 2; R pirifromis stretch, performed with hip below 90 due to groin pain when attempted above 90, 30s x 2; R hip adductor stretch (moving into abduction) x 30s; R HS stretch 30s x 2; L sidelying R hip flexor stretch 30s x 2; Prone R quad stretch with towel under distal aspect of knee 60 seconds x4 trials Roller to to R lateral and anterolateral hip as well as lateral thigh (over IT band), pt reports no irritation of her pain;     TherEx: Superset:  Bridge:10x; 2 sets ; cues for posterior pelvic tilt Posterior pelvic tilt with adduction ball squeeze 10x 3 second holds; x2 sets   Superset: Clamshells 15x; 2 sets cues for pain free range Hip extension in sidelying 15x; with stabilization to  foot provided 2 sets   Superset:  Prone hamstring curls 10x; 2 sets; towel under distal aspect of knee  prone froggers 10x; 2 sets   Superset: Sissy squat from on heels to tall kneeling 10x 3 second glute squeeze at top  Quadruped hip extension 10x each LE   Educated on pre squat routine -modified 90 90 stretch 10x  standing: -IT band stretch 2 x45 second holds -stair hip flexor stretch; neutral alignment 30 seconds, inversion 30 seconds, eversion 30 seconds -stool hip flexor lengthening stretch 30 seconds -stool IR/ER hip flexor lengthening stretch 30 seconds          Pt educated throughout session about proper posture and technique with exercises. Improved exercise technique, movement at target joints, use of target muscles after min to mod verbal, visual, tactile cues.    Patient is highly motivated throughout physical therapy session. Continued focus on strengthening of posterior chain and lengthening of anterior chain and IT region performed. Patient reports decreased pain at end of session and understanding of functional pre-squat routine. She would benefit from skilled PT Intervention to re-educate patient in LE stretches and strengthening exercise to improve RLE ROM and stability                 PT Education - 07/28/20 0710     Education Details exercise technique, body mechanics    Person(s) Educated Patient    Methods Explanation;Demonstration;Tactile cues;Verbal cues    Comprehension Verbalized understanding;Returned demonstration;Verbal cues required;Tactile cues required              PT Short Term Goals - 07/18/20 1055       PT SHORT TERM GOAL #1   Title Patient will be independent and adherent with HEP at least 3x a week for better LE flexibility and strength;    Time 2    Period Weeks    Status New    Target Date 08/01/20               PT Long Term Goals - 07/18/20 1055       PT LONG TERM GOAL #1   Title Patient will improve  flexibility in right anterior hip as evidenced with knee flexion > 65 degrees during thomas test to improve LE flexibility;    Time 4    Period Weeks    Status New    Target Date 08/15/20      PT LONG TERM GOAL #  2   Title Patient will improve FOTO score to >65% to indicate improved functional mobility with ADLs.    Time 4    Period Weeks    Status New    Target Date 08/15/20      PT LONG TERM GOAL #3   Title Pt will improve her R hip strength to a 4+/5 in order to improve her ability to perform ADLs and IADLs with less difficulty and to improve her quality of life.    Time 4    Period Weeks    Status New    Target Date 08/15/20      PT LONG TERM GOAL #4   Title Patient will improve 10 meter walk speed to >1.2 m/s to achieve community ambulatory status and reduce fall risk.    Time 4    Period Weeks    Status New    Target Date 08/15/20      PT LONG TERM GOAL #5   Title Patient will report a worst pain of 5/10 in right hip over last week to indicate improved tolerance with ADLs.    Time 4    Period Weeks    Status New    Target Date 08/15/20                   Plan - 07/28/20 0801     Clinical Impression Statement Patient is highly motivated throughout physical therapy session. Continued focus on strengthening of posterior chain and lengthening of anterior chain and IT region performed. Patient reports decreased pain at end of session and understanding of functional pre-squat routine. She would benefit from skilled PT Intervention to re-educate patient in LE stretches and strengthening exercise to improve RLE ROM and stability    Personal Factors and Comorbidities Comorbidity 3+    Comorbidities arthritis, BMI >30, depression, GERD    Examination-Activity Limitations Squat;Stairs;Stand;Locomotion Level;Bend;Transfers    Examination-Participation Restrictions Shop;Community Activity;Yard Work    Stability/Clinical Decision Making Stable/Uncomplicated    PT Frequency  2x / week    PT Duration 4 weeks    PT Treatment/Interventions Cryotherapy;Electrical Stimulation;Moist Heat;Gait training;Stair training;Functional mobility training;Therapeutic activities;Therapeutic exercise;Balance training;Neuromuscular re-education;Patient/family education;Manual techniques;Passive range of motion;Dry needling;Energy conservation    PT Next Visit Plan advance HEP with LE ROM and strengthening, focusing on hip and things to do at gym    PT Home Exercise Plan initiated- see patient instructions;    Consulted and Agree with Plan of Care Patient             Patient will benefit from skilled therapeutic intervention in order to improve the following deficits and impairments:  Decreased endurance, Difficulty walking, Hypomobility, Decreased range of motion, Decreased activity tolerance, Decreased strength, Pain  Visit Diagnosis: Pain in right hip  Difficulty in walking, not elsewhere classified  Muscle weakness (generalized)     Problem List Patient Active Problem List   Diagnosis Date Noted   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 03/23/2020   Pain in right hip 01/12/2019   Vitamin D deficiency 02/25/2018   Chronic pain 10/24/2015   Hot flashes 10/24/2015   Migraine without aura and without status migrainosus, not intractable 41/28/7867   Metabolic syndrome 67/20/9470   Dyslipidemia 10/24/2015   Allergic rhinitis, seasonal 05/17/2015   Asthma, mild intermittent 08/08/2014   Chronic insomnia 08/08/2014   DDD (degenerative disc disease), lumbar 08/08/2014   Mood disorder ([place]) 08/08/2014   Gastro-esophageal reflux disease without esophagitis 08/08/2014   H/O  total hysterectomy 08/08/2014   Irritable bowel syndrome with diarrhea 08/08/2014   Osteoarthritis of right hip 08/08/2014   Adult BMI 30+ 08/08/2014   SBO (spina bifida occulta) 08/08/2014    Janna Arch, PT, DPT  07/28/2020, 8:02 AM  [place] MAIN [facility] SERVICES [address] [place], [place], [address] Phone: [phone]   Fax:  [phone]  Name: Shelby Henson MRN: [id number] Date of Birth: 02-03-1970

## 2020-07-28 NOTE — Progress Notes (Signed)
Name: Shelby Henson   MRN: [id number]    DOB: May 14, 1970   Date:07/28/2020       Progress Note  Subjective  Chief Complaint  Stomach Issues  HPI  [place] COVID-19: diagnosed 11/27/2019 had infusion on 11/30/2019 she is back to work since 12/07/2019. She was seen in our office 11/23 for regular visit and was still having chest tightness we gave her colchicine and symptoms resolved however since yesterday pain is back, she thought it was an asthma flare but did not improve with albuterol treatment. Pain is not associated with sob or wheezing. No diaphoresis . She continues to have lack of sense of taste and smell .Unchanged  Epigastric discomfort: she noticed symptoms about one month ago, usually has a sensation that something is stuck on epigastric area about two hours after meals. No pain, no change in bowel movements, no vomiting. She has not been taking Ozempic, no change in medication. Not associated with sob or diaphoresis, but radiates to her back She tried Pepcid without help, also states movement or change in position does not help. She tried Pantoprazole from her daughter and symptoms improved significantly. She is aware she should not take medication from someone else, but would like a rx for herself.   Asthma mild intermittent: she states last week she noticed sore throat followed by increase in sob and some wheezing, she increase frequency of her asthma medications and is feeling well today    Metabolic Syndrome: she was Ozempic, and was losing weight, however since COVID-19 09/2019 , she states appetite is improving even though still has lack of sense of smell and taste, weight is going up and would like to resume Ozempic    Chronic right hip pain: s/p replacement but still on modified duty at work and has daily pain, no longer seeing pain clinic, but sees Dr. Harlow Mares once a year. She would like to have a refill of meloxicam 15 mg daily . Pain is described as burning, dull and aching,  constant, still requires her to shift positions frequently to control symptoms. Seeing neurologist now , had MRI and has mild arthritis lumbar spine, and Dr. Manuella Ghazi is ordering NCS and EMG. She is on higher dose of gabapentin now but did not notice a difference in pain level .    Insomnia: taking trazodone , but is not working , difficulty staying asleep, she states it does not seem to be secondary to pain, she states when she wakes up her mind wonders. Lunesta made her moody, she also tried Temazepam, discussed [phone] and she is willing to try it    Mood swings :she has been on Pritiq for many years, she has a family history of bipolar She denies side effects, no anxiety symptoms, phq 9 is 6 today, t helps control her mood   Low B12: she recently had labs done by Dr. Manuella Ghazi and showed low B12 , vitamin D was normal, B1 was normal .   Anemia unspecified: lab done about one month ago at Dr. Manuella Ghazi, she has a history of iron deficiency anemia and has not been taking supplements, advised a MVI with iron and we will recheck labs on her next visit   Patient Active Problem List   Diagnosis Date Noted   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 03/23/2020   Pain in right hip 01/12/2019   Vitamin D deficiency 02/25/2018   Chronic pain 10/24/2015   Hot flashes 10/24/2015   Migraine  without aura and without status migrainosus, not intractable 66/44/0347   Metabolic syndrome 42/59/5638   Dyslipidemia 10/24/2015   Allergic rhinitis, seasonal 05/17/2015   Asthma, mild intermittent 08/08/2014   Chronic insomnia 08/08/2014   DDD (degenerative disc disease), lumbar 08/08/2014   Mood disorder ([place]) 08/08/2014   Gastro-esophageal reflux disease without esophagitis 08/08/2014   H/O total hysterectomy 08/08/2014   Irritable bowel syndrome with diarrhea 08/08/2014   Osteoarthritis of right hip 08/08/2014   Adult BMI 30+ 08/08/2014   SBO (spina bifida occulta) 08/08/2014    Past  Surgical History:  Procedure Laterality Date   ABDOMINAL HYSTERECTOMY     ABLATION     BREAST CYST ASPIRATION Left    CESAREAN SECTION     CESAREAN SECTION     CESAREAN SECTION     HIP SURGERY  10/2018   LAPAROSCOPY  2018   LEEP     TOTAL HIP ARTHROPLASTY Right 11/12/2018   Procedure: TOTAL HIP ARTHROPLASTY ANTERIOR APPROACH;  Surgeon: Lovell Sheehan, MD;  Location: [facility] ORS;  Service: Orthopedics;  Laterality: Right;   TOTAL VAGINAL HYSTERECTOMY      Family History  Problem Relation Age of Onset   Bipolar disorder Brother    Depression Mother    Breast cancer Neg Hx     Social History   Tobacco Use   Smoking status: Never   Smokeless tobacco: Never  Substance Use Topics   Alcohol use: Yes    Alcohol/week: 0.0 standard drinks    Comment: occasionally     Current Outpatient Medications:    cloNIDine (CATAPRES) 0.2 MG tablet, TAKE 1 TABLET BY MOUTH TWICE DAILY, Disp: 180 tablet, Rfl: 3   colchicine 0.6 MG tablet, TAKE 1 TABLET BY MOUTH ONCE DAILY, Disp: 30 tablet, Rfl: 1   Daridorexant HCl (QUVIVIQ) 50 MG TABS, Take 1 tablet by mouth every evening., Disp: 30 tablet, Rfl: 0   gabapentin (NEURONTIN) 300 MG capsule, Take 2 capsules (600 mg total) by mouth nightly for 90 days, Disp: 180 capsule, Rfl: 3   SUMAtriptan (IMITREX) 100 MG tablet, TAKE 1 TABLET BY MOUTH EVERY 2 HOURS AS NEEDED FOR MIGRAINE. MAY REPEAT IN 2 HOURS IF HEADACHE PERSISTS OR RECURS., Disp: 10 tablet, Rfl: 0   albuterol (PROVENTIL) (2.5 MG/3ML) 0.083% nebulizer solution, USE ONE VIAL VIA NEBULIZER EVERY 6 HOURS AS NEEDED FOR WHEEZING OR SHORTNESS OF BREATH., Disp: 75 mL, Rfl: 2   budesonide (PULMICORT) 0.5 MG/2ML nebulizer solution, TAKE 2 MLS BY NEBULIZATION 2 TIMES DAILY., Disp: 60 mL, Rfl: 5   desvenlafaxine (PRISTIQ) 50 MG 24 hr tablet, TAKE 1 TABLET BY MOUTH DAILY., Disp: 90 tablet, Rfl: 1   meloxicam (MOBIC) 15 MG tablet, take one tablet by mouth once daily, Disp: 90 tablet, Rfl: 1  Allergies   Allergen Reactions   Celecoxib Hives   Prednisone     intolerance, makes her angry. Makes her angry   Topamax [Topiramate]     Mood changes   Zolpidem    Codeine Rash   Penicillins Rash    Did it involve swelling of the face/tongue/throat, SOB, or low BP? No Did it involve sudden or severe rash/hives, skin peeling, or any reaction on the inside of your mouth or nose? Yes Did you need to seek medical attention at a hospital or doctor's office? Yes When did it last happen? More than 20 years ago       If all above answers are "NO", may proceed with cephalosporin use.  I personally reviewed active problem list, medication list, allergies, family history, social history, health maintenance with the patient/caregiver today.   ROS  Constitutional: Negative for fever, positive for  weight change.  Respiratory: Negative for cough and shortness of breath.   Cardiovascular: Negative for chest pain or palpitations.  Gastrointestinal: Negative for abdominal pain, no bowel changes.  Musculoskeletal: Negative for gait problem or joint swelling.  Skin: Negative for rash.  Neurological: Negative for dizziness or headache.  No other specific complaints in a complete review of systems (except as listed in HPI above).   Objective  Vitals:   07/28/20 1100  BP: 112/62  Pulse: 87  Resp: 16  Temp: 98.2 F (36.8 C)  TempSrc: Oral  SpO2: 98%  Weight: 172 lb (78 kg)  Height: 4\' 11"  (1.499 m)    Body mass index is 34.74 kg/m.  Physical Exam  Constitutional: Patient appears well-developed and well-nourished. Obese  No distress.  HEENT: head atraumatic, normocephalic, pupils equal and reactive to light, neck supple Cardiovascular: Normal rate, regular rhythm and normal heart sounds.  No murmur heard. No BLE edema. Pulmonary/Chest: Effort normal and breath sounds normal. No respiratory distress. Abdominal: Soft.  There is no tenderness. Normal bowel sounds  Psychiatric: Patient has a  normal mood and affect. behavior is normal. Judgment and thought content normal.   PHQ2/9: Depression screen [facility] 2/9 07/28/2020 03/23/2020 12/22/2019 12/08/2019 11/11/2019  Decreased Interest 0 1 1 0 3  Down, Depressed, Hopeless 0 0 3 0 0  PHQ - 2 Score 0 1 4 0 3  Altered sleeping 3 3 3  - 0  Tired, decreased energy 1 1 0 - 2  Change in appetite 0 3 3 - 0  Feeling bad or failure about yourself  0 0 0 - 0  Trouble concentrating 2 1 1  - 0  Moving slowly or fidgety/restless 0 0 0 - 0  Suicidal thoughts 0 0 0 - 0  PHQ-9 Score 6 9 11  - 5  Difficult doing work/chores Not difficult at all Somewhat difficult Not difficult at all - Very difficult  Some recent data might be hidden    phq 9 is positive   Fall Risk: Fall Risk  07/28/2020 03/23/2020 12/22/2019 12/08/2019 11/11/2019  Falls in the past year? 1 0 0 0 0  Number falls in past yr: 0 0 0 0 0  Injury with Fall? 1 0 0 0 0  Comment - - - - -     Functional Status Survey: Is the patient deaf or have difficulty hearing?: No Does the patient have difficulty seeing, even when wearing glasses/contacts?: No Does the patient have difficulty concentrating, remembering, or making decisions?: Yes Does the patient have difficulty walking or climbing stairs?: Yes Does the patient have difficulty dressing or bathing?: No Does the patient have difficulty doing errands alone such as visiting a doctor's office or shopping?: No    Assessment & Plan  1. Primary insomnia  - Daridorexant HCl (QUVIVIQ) 50 MG TABS; Take 1 tablet by mouth every evening.  Dispense: 30 tablet; Refill: 0  2. Mild intermittent asthma in adult without complication  - budesonide (PULMICORT) 0.5 MG/2ML nebulizer solution; TAKE 2 MLS BY NEBULIZATION 2 TIMES DAILY.  Dispense: 60 mL; Refill: 5 - albuterol (PROVENTIL) (2.5 MG/3ML) 0.083% nebulizer solution; USE ONE VIAL VIA NEBULIZER EVERY 6 HOURS AS NEEDED FOR WHEEZING OR SHORTNESS OF BREATH.  Dispense: 75 mL; Refill: 2  3.  Mood disorder (HCC)  - desvenlafaxine (PRISTIQ) 50 MG  24 hr tablet; TAKE 1 TABLET BY MOUTH DAILY.  Dispense: 90 tablet; Refill: 1  4. Hot flashes  - desvenlafaxine (PRISTIQ) 50 MG 24 hr tablet; TAKE 1 TABLET BY MOUTH DAILY.  Dispense: 90 tablet; Refill: 1  5. Vitamin D deficiency   6. Migraine without aura and without status migrainosus, not intractable   7. Dyslipidemia   8. Metabolic syndrome   9. Obesity, Class II, BMI 30-35    10. Chronic right hip pain  - meloxicam (MOBIC) 15 MG tablet; take one tablet by mouth once daily  Dispense: 90 tablet; Refill: 1  11. Epigastric discomfort  - pantoprazole (PROTONIX) 40 MG tablet; Take 1 tablet (40 mg total) by mouth daily.  Dispense: 30 tablet; Refill: 0

## 2020-08-02 ENCOUNTER — Ambulatory Visit: Payer: No Typology Code available for payment source | Attending: Family Medicine | Admitting: Physical Therapy

## 2020-08-02 ENCOUNTER — Other Ambulatory Visit: Payer: Self-pay

## 2020-08-02 ENCOUNTER — Encounter: Payer: Self-pay | Admitting: Physical Therapy

## 2020-08-02 DIAGNOSIS — R262 Difficulty in walking, not elsewhere classified: Secondary | ICD-10-CM | POA: Diagnosis present

## 2020-08-02 DIAGNOSIS — M25551 Pain in right hip: Secondary | ICD-10-CM | POA: Insufficient documentation

## 2020-08-02 DIAGNOSIS — M6281 Muscle weakness (generalized): Secondary | ICD-10-CM | POA: Diagnosis present

## 2020-08-02 NOTE — Therapy (Signed)
[place] MAIN [facility] SERVICES [address] [place], [place], [address] Phone: [phone]   Fax:  [phone]  Physical Therapy Treatment  Patient Details  Name: Shelby Henson MRN: [id number] Date of Birth: [birth year] Referring Provider (PT): Dr. Kurtis Bushman   Encounter Date: 08/02/2020   PT End of Session - 08/02/20 1150     Visit Number 4    Number of Visits 9    Date for PT Re-Evaluation 08/15/20    PT Start Time 57    PT Stop Time 2458    PT Time Calculation (min) 43 min    Activity Tolerance Patient tolerated treatment well;Patient limited by pain    Behavior During Therapy [facility] for tasks assessed/performed             Past Medical History:  Diagnosis Date   Anemia    Asthma    well controlled   Complication of anesthesia    Depression    GERD (gastroesophageal reflux disease)    occ   Migraine    Morbid obesity ([place])    Osteoarthritis    PONV (postoperative nausea and vomiting)    with c-section x 1    Past Surgical History:  Procedure Laterality Date   ABDOMINAL HYSTERECTOMY     ABLATION     BREAST CYST ASPIRATION Left    CESAREAN SECTION     [place]  10/2018   LAPAROSCOPY  2018   LEEP     TOTAL HIP ARTHROPLASTY Right 11/12/2018   Procedure: TOTAL HIP ARTHROPLASTY ANTERIOR APPROACH;  Surgeon: Lovell Sheehan, MD;  Location: [facility] ORS;  Service: Orthopedics;  Laterality: Right;   TOTAL VAGINAL HYSTERECTOMY      There were no vitals filed for this visit.   Subjective Assessment - 08/02/20 1105     Subjective Patient reports increased soreness in right hip. She reports some relief with stretches during the time but then states that since she has started back with exercise her hip has been more sore.    Pertinent History 50 yo Patient is s/p RLE THA in October 2020 with continued right hip pain. She had outpatient PT from Oct 2020-March 2021 with some improvement,  however she continues to have numbness along right anterior/lateral hip and chronic hip pain.  She had a recent CT scan which was negative for loosening hardware.  Going to gym 3x a week doing weighted squats (45#)- reports while she's doing them it feels good.  Doing hip thrusts, leg press (80#), unable to do treadmill- has pain when walking; Tried 2.0 mph with increased pain; Reports pain after walking 2 min; Doesn't feel like she limps; Will have increased pain after doing a lot of walking; She did see neurologist who diagnosed her with meralgia parasthetica. She did get MRI of lumbar spine.    Limitations Standing;Walking;Sitting    How long can you sit comfortably? 1 hour    How long can you stand comfortably? 20 min;    How long can you walk comfortably? unable to walk on treadmill; 30 min overground walking, limited to short distances;    Diagnostic tests MRI of lumbar spine shows mild right subarticular stenosis at L4-L5; otherwise unremarkable.    Patient Stated Goals "I want strength in my legs"    Currently in Pain? Yes    Pain Score 4     Pain Location Hip  Pain Orientation Right    Pain Descriptors / Indicators Aching    Pain Type Chronic pain    Pain Onset More than a month ago    Pain Frequency Intermittent    Aggravating Factors  worse with prolonged sitting/standing/walking    Pain Relieving Factors rest/strech    Effect of Pain on Daily Activities decreased activity tolerance;    Multiple Pain Sites No                TREATMENT: PT educated patient in safe gym equipment/exercise to facilitate better hip ROM/strength:  Patient instructed in stretches on mat: -RLE hip abductor cross body stretch 20 sec hold with cues for proper positioning -RLE piriformis stretch 20 sec hold x1 rep -RLE hamstring stretch with ankle DF 20 sec hold x1 rep -RLE hip flexor stretch in 1/2 kneel lunge 20 sec hold x1 rep;  Patient expressed that she usually does just 2-5 min on  elliptical and then 2 min on stepper for cardio Recommend patient hold off on stepper for right now until stronger Educated patient in proper positioning on elliptical with instruction to focus on gluteal squeeze with each step for better posterior hip strengthening  Patient expressed that she mainly will do weighted squats, leg press and hamstring curl/leg extension machine; educated patient that those exercises are good but will primarily strengthening the thigh and not her hip  Instructed patient in resisted exercise for hip strengthening Hip abductor/adductor machine- discussed as we don't have that equipment in our gym facility with instruction to use light weight and start at 2 sets of 10 to build up tolerance  Standing hip flexion against green tband x15 reps with min VCs for proper positioning;  Patient provided with written HEP for better adherence with gym program. Recommend patient go to gym 3x a week for better tolerance;   Patient reports no change in hip pain during session. She finished session getting ice pack for right hip (no charge)                       PT Education - 08/02/20 1149     Education Details gym exercise/HEP    Person(s) Educated Patient    Methods Explanation;Verbal cues    Comprehension Verbalized understanding;Returned demonstration;Verbal cues required;Need further instruction              PT Short Term Goals - 07/18/20 1055       PT SHORT TERM GOAL #1   Title Patient will be independent and adherent with HEP at least 3x a week for better LE flexibility and strength;    Time 2    Period Weeks    Status New    Target Date 08/01/20               PT Long Term Goals - 07/18/20 1055       PT LONG TERM GOAL #1   Title Patient will improve flexibility in right anterior hip as evidenced with knee flexion > 65 degrees during thomas test to improve LE flexibility;    Time 4    Period Weeks    Status New    Target Date  08/15/20      PT LONG TERM GOAL #2   Title Patient will improve FOTO score to >65% to indicate improved functional mobility with ADLs.    Time 4    Period Weeks    Status New    Target Date 08/15/20  PT LONG TERM GOAL #3   Title Pt will improve her R hip strength to a 4+/5 in order to improve her ability to perform ADLs and IADLs with less difficulty and to improve her quality of life.    Time 4    Period Weeks    Status New    Target Date 08/15/20      PT LONG TERM GOAL #4   Title Patient will improve 10 meter walk speed to >1.2 m/s to achieve community ambulatory status and reduce fall risk.    Time 4    Period Weeks    Status New    Target Date 08/15/20      PT LONG TERM GOAL #5   Title Patient will report a worst pain of 5/10 in right hip over last week to indicate improved tolerance with ADLs.    Time 4    Period Weeks    Status New    Target Date 08/15/20                   Plan - 08/02/20 1312     Clinical Impression Statement Patient motivated and participated well within session. She continues to have increased right hip pain which is not changed with exercise. Instructed patient in hip strengthening/ROM exercise to do at gym for increased HEP adherence to improve hip strength and stabilization. Patient does require min Vcs for proper exercise technique. She also required instruction to stretch before and after exercise for better hip mobility. Recommend patient ice hip for approximately 20 min after gym exercise to reduce delayed onset muscle soreness. she would benefit from additional skilled PT intervention to improve ROM/strength and reduce pain with ADLs.    Personal Factors and Comorbidities Comorbidity 3+    Comorbidities arthritis, BMI >30, depression, GERD    Examination-Activity Limitations Squat;Stairs;Stand;Locomotion Level;Bend;Transfers    Examination-Participation Restrictions Shop;Community Activity;Yard Work    Stability/Clinical Decision  Making Stable/Uncomplicated    PT Frequency 2x / week    PT Duration 4 weeks    PT Treatment/Interventions Cryotherapy;Electrical Stimulation;Moist Heat;Gait training;Stair training;Functional mobility training;Therapeutic activities;Therapeutic exercise;Balance training;Neuromuscular re-education;Patient/family education;Manual techniques;Passive range of motion;Dry needling;Energy conservation    PT Next Visit Plan advance HEP with LE ROM and strengthening, focusing on hip and things to do at gym    PT Home Exercise Plan initiated- see patient instructions;    Consulted and Agree with Plan of Care Patient             Patient will benefit from skilled therapeutic intervention in order to improve the following deficits and impairments:  Decreased endurance, Difficulty walking, Hypomobility, Decreased range of motion, Decreased activity tolerance, Decreased strength, Pain  Visit Diagnosis: Pain in right hip  Difficulty in walking, not elsewhere classified  Muscle weakness (generalized)     Problem List Patient Active Problem List   Diagnosis Date Noted   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 03/23/2020   Pain in right hip 01/12/2019   Vitamin D deficiency 02/25/2018   Chronic pain 10/24/2015   Hot flashes 10/24/2015   Migraine without aura and without status migrainosus, not intractable 52/77/8242   Metabolic syndrome 35/36/1443   Dyslipidemia 10/24/2015   Allergic rhinitis, seasonal 05/17/2015   Asthma, mild intermittent 08/08/2014   Chronic insomnia 08/08/2014   DDD (degenerative disc disease), lumbar 08/08/2014   Mood disorder ([place]) 08/08/2014   Gastro-esophageal reflux disease without esophagitis 08/08/2014   H/O total hysterectomy 08/08/2014   Irritable bowel  syndrome with diarrhea 08/08/2014   Osteoarthritis of right hip 08/08/2014   Adult BMI 30+ 08/08/2014   SBO (spina bifida occulta) 08/08/2014    Srishti Strnad PT, DPT 08/02/2020,  1:15 PM  [place] MAIN [facility] SERVICES [address] [place], [place], [address] Phone: [phone]   Fax:  [phone]  Name: ANDALYN HECKSTALL MRN: [id number] Date of Birth: [birth year]

## 2020-08-02 NOTE — Patient Instructions (Signed)
Access Code: [email or web address] URL: [email or web address] Date: 08/02/2020 Prepared by: Blanche East  Exercises Long Sitting Hamstring Stretch - 1 x daily - 7 x weekly - 1 sets - 2-3 reps - 20-30 sec hold Half Kneeling Hip Flexor Stretch - 1 x daily - 7 x weekly - 1 sets - 2-3 reps - 20-30 hold Elliptical - 1 x daily - 3 x weekly - 5 min hold Hip Abduction Machine - 1 x daily - 3 x weekly - 2 sets - 10 reps Hip Adduction Machine - 1 x daily - 3 x weekly - 2 sets - 10 reps Hip and Knee Flexion with Anchored Resistance - 1 x daily - 3 x weekly - 2-3 sets - 10 reps

## 2020-08-04 ENCOUNTER — Ambulatory Visit: Payer: No Typology Code available for payment source

## 2020-08-04 ENCOUNTER — Other Ambulatory Visit: Payer: Self-pay

## 2020-08-04 DIAGNOSIS — M25551 Pain in right hip: Secondary | ICD-10-CM

## 2020-08-04 DIAGNOSIS — R262 Difficulty in walking, not elsewhere classified: Secondary | ICD-10-CM

## 2020-08-04 DIAGNOSIS — M6281 Muscle weakness (generalized): Secondary | ICD-10-CM

## 2020-08-04 NOTE — Therapy (Signed)
[place] MAIN [facility] SERVICES [address] [place], [place], [address] Phone: [phone]   Fax:  [phone]  Physical Therapy Treatment  Patient Details  Name: Shelby Henson MRN: [id number] Date of Birth: 09-Nov-1970 Referring Provider (PT): Dr. Kurtis Bushman   Encounter Date: 08/04/2020   PT End of Session - 08/04/20 0711     Visit Number 5    Number of Visits 9    Date for PT Re-Evaluation 08/15/20    PT Start Time 0715    PT Stop Time 0758    PT Time Calculation (min) 43 min    Activity Tolerance Patient tolerated treatment well;Patient limited by pain    Behavior During Therapy [facility] for tasks assessed/performed             Past Medical History:  Diagnosis Date   Anemia    Asthma    well controlled   Complication of anesthesia    Depression    GERD (gastroesophageal reflux disease)    occ   Migraine    Morbid obesity ([place])    Osteoarthritis    PONV (postoperative nausea and vomiting)    with c-section x 1    Past Surgical History:  Procedure Laterality Date   ABDOMINAL HYSTERECTOMY     ABLATION     BREAST CYST ASPIRATION Left    CESAREAN SECTION     [place]  10/2018   LAPAROSCOPY  2018   LEEP     TOTAL HIP ARTHROPLASTY Right 11/12/2018   Procedure: TOTAL HIP ARTHROPLASTY ANTERIOR APPROACH;  Surgeon: Lovell Sheehan, MD;  Location: [facility] ORS;  Service: Orthopedics;  Laterality: Right;   TOTAL VAGINAL HYSTERECTOMY      There were no vitals filed for this visit.   Subjective Assessment - 08/04/20 0745     Subjective Patient reports she has not been to the gym since last session. Will go tonight. Has been compliant with HEP.    Pertinent History 50 yo Patient is s/p RLE THA in October 2020 with continued right hip pain. She had outpatient PT from Oct 2020-March 2021 with some improvement, however she continues to have numbness along right anterior/lateral hip and chronic  hip pain.  She had a recent CT scan which was negative for loosening hardware.  Going to gym 3x a week doing weighted squats (45#)- reports while she's doing them it feels good.  Doing hip thrusts, leg press (80#), unable to do treadmill- has pain when walking; Tried 2.0 mph with increased pain; Reports pain after walking 2 min; Doesn't feel like she limps; Will have increased pain after doing a lot of walking; She did see neurologist who diagnosed her with meralgia parasthetica. She did get MRI of lumbar spine.    Limitations Standing;Walking;Sitting    How long can you sit comfortably? 1 hour    How long can you stand comfortably? 20 min;    How long can you walk comfortably? unable to walk on treadmill; 30 min overground walking, limited to short distances;    Diagnostic tests MRI of lumbar spine shows mild right subarticular stenosis at L4-L5; otherwise unremarkable.    Patient Stated Goals "I want strength in my legs"    Currently in Pain? Yes    Pain Score 2     Pain Location Hip    Pain Orientation Right    Pain Descriptors / Indicators Aching  Pain Type Chronic pain    Pain Onset More than a month ago    Pain Frequency Intermittent                  Manual Therapy  R single knee to chest stretch 30s x 2; R FABER stretch 30s x 2; R pirifromis stretch, performed with hip below 90 due to groin pain when attempted above 90, 30s x 2; R hip adductor stretch (moving into abduction) x 30s; R HS stretch 30s x 2; L sidelying R hip flexor stretch 30s x 2; Prone R quad stretch with towel under distal aspect of knee 60 seconds x4 trials Roller to to R lateral and anterolateral hip as well as lateral thigh (over IT band), pt reports no irritation of her pain;     TherEx:  Bridge:single LE with opp LE in piriformis stretch (figure 4) 10x each LE   9lb dumbell self mobilization with heel slide 10x  Superset: Clamshells 15x; 2 sets cues for pain free range Hip extension in  sidelying 15x; with stabilization to foot provided 2 sets    Superset: Prone hamstring curls 15x; 2 sets; towel under distal aspect of knee  prone froggers 10x; 2 sets    Superset: Sissy squat from on heels to tall kneeling 10x 3 second glute squeeze at top Quadruped hip extension 10x each LE    7.5 # cable hip extension 12x; 2 sets    standing: -IT band stretch 2 x45 second holds -stool hip flexor lengthening stretch 30 seconds -stool IR/ER hip flexor lengthening stretch 30 seconds    Seated: GTB adduction 12x 3 second holds        Pt educated throughout session about proper posture and technique with exercises. Improved exercise technique, movement at target joints, use of target muscles after min to mod verbal, visual, tactile cues.     Patient educated with continuous posterior chain strengthening and anterior chain lengthening interventions. Demonstrated understanding with visual and verbal cueing. Decreased stiffness reported by end of session however does have "burning" of musculature after repeated activation, educated that it is an indicator of fatigue.  Continued education on importance of strengthening for symptom reduction. She would benefit from skilled PT Intervention to re-educate patient in LE stretches and strengthening exercise to improve RLE ROM and stability                  PT Education - 08/04/20 0711     Education Details exercise technique, body mechanics    Person(s) Educated Patient    Methods Explanation;Demonstration;Tactile cues;Verbal cues    Comprehension Verbalized understanding;Returned demonstration;Verbal cues required              PT Short Term Goals - 07/18/20 1055       PT SHORT TERM GOAL #1   Title Patient will be independent and adherent with HEP at least 3x a week for better LE flexibility and strength;    Time 2    Period Weeks    Status New    Target Date 08/01/20               PT Long Term Goals -  07/18/20 1055       PT LONG TERM GOAL #1   Title Patient will improve flexibility in right anterior hip as evidenced with knee flexion > 65 degrees during thomas test to improve LE flexibility;    Time 4    Period Weeks    Status  New    Target Date 08/15/20      PT LONG TERM GOAL #2   Title Patient will improve FOTO score to >65% to indicate improved functional mobility with ADLs.    Time 4    Period Weeks    Status New    Target Date 08/15/20      PT LONG TERM GOAL #3   Title Pt will improve her R hip strength to a 4+/5 in order to improve her ability to perform ADLs and IADLs with less difficulty and to improve her quality of life.    Time 4    Period Weeks    Status New    Target Date 08/15/20      PT LONG TERM GOAL #4   Title Patient will improve 10 meter walk speed to >1.2 m/s to achieve community ambulatory status and reduce fall risk.    Time 4    Period Weeks    Status New    Target Date 08/15/20      PT LONG TERM GOAL #5   Title Patient will report a worst pain of 5/10 in right hip over last week to indicate improved tolerance with ADLs.    Time 4    Period Weeks    Status New    Target Date 08/15/20                   Plan - 08/04/20 1406     Clinical Impression Statement Patient educated with continuous posterior chain strengthening and anterior chain lengthening interventions. Demonstrated understanding with visual and verbal cueing. Decreased stiffness reported by end of session however does have "burning" of musculature after repeated activation, educated that it is an indicator of fatigue.  Continued education on importance of strengthening for symptom reduction. She would benefit from skilled PT Intervention to re-educate patient in LE stretches and strengthening exercise to improve RLE ROM and stability    Personal Factors and Comorbidities Comorbidity 3+    Comorbidities arthritis, BMI >30, depression, GERD    Examination-Activity Limitations  Squat;Stairs;Stand;Locomotion Level;Bend;Transfers    Examination-Participation Restrictions Shop;Community Activity;Yard Work    Stability/Clinical Decision Making Stable/Uncomplicated    PT Frequency 2x / week    PT Duration 4 weeks    PT Treatment/Interventions Cryotherapy;Electrical Stimulation;Moist Heat;Gait training;Stair training;Functional mobility training;Therapeutic activities;Therapeutic exercise;Balance training;Neuromuscular re-education;Patient/family education;Manual techniques;Passive range of motion;Dry needling;Energy conservation    PT Next Visit Plan advance HEP with LE ROM and strengthening, focusing on hip and things to do at gym    PT Home Exercise Plan initiated- see patient instructions;    Consulted and Agree with Plan of Care Patient             Patient will benefit from skilled therapeutic intervention in order to improve the following deficits and impairments:  Decreased endurance, Difficulty walking, Hypomobility, Decreased range of motion, Decreased activity tolerance, Decreased strength, Pain  Visit Diagnosis: Pain in right hip  Difficulty in walking, not elsewhere classified  Muscle weakness (generalized)     Problem List Patient Active Problem List   Diagnosis Date Noted   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 03/23/2020   Pain in right hip 01/12/2019   Vitamin D deficiency 02/25/2018   Chronic pain 10/24/2015   Hot flashes 10/24/2015   Migraine without aura and without status migrainosus, not intractable 92/42/6834   Metabolic syndrome 19/62/2297   Dyslipidemia 10/24/2015   Allergic rhinitis, seasonal 05/17/2015   Asthma, mild intermittent  08/08/2014   Chronic insomnia 08/08/2014   DDD (degenerative disc disease), lumbar 08/08/2014   Mood disorder ([place]) 08/08/2014   Gastro-esophageal reflux disease without esophagitis 08/08/2014   H/O total hysterectomy 08/08/2014   Irritable bowel syndrome with diarrhea  08/08/2014   Osteoarthritis of right hip 08/08/2014   Adult BMI 30+ 08/08/2014   SBO (spina bifida occulta) 08/08/2014    Janna Arch, PT, DPT  08/04/2020, 2:07 PM  [place] MAIN [facility] SERVICES [address] [place], [place], [address] Phone: [phone]   Fax:  [phone]  Name: ANAROSA KUBISIAK MRN: [id number] Date of Birth: 27-Jul-1970

## 2020-08-08 ENCOUNTER — Other Ambulatory Visit: Payer: Self-pay

## 2020-08-08 ENCOUNTER — Ambulatory Visit: Payer: No Typology Code available for payment source

## 2020-08-08 DIAGNOSIS — M6281 Muscle weakness (generalized): Secondary | ICD-10-CM

## 2020-08-08 DIAGNOSIS — M25551 Pain in right hip: Secondary | ICD-10-CM

## 2020-08-08 DIAGNOSIS — R262 Difficulty in walking, not elsewhere classified: Secondary | ICD-10-CM

## 2020-08-08 NOTE — Therapy (Signed)
[place] MAIN REHAB SERVICES [address] [place], [place], [address] Phone: [phone]   Fax:  [phone]  August 08, 2020   No Recipients  Physical Therapy Discharge Summary  Patient: Shelby Henson  MRN: [id number]  Date of Birth: 10/04/1970   Diagnosis: Pain in right hip  Difficulty in walking, not elsewhere classified  Muscle weakness (generalized) Referring Provider (PT): Dr. James Bowers   The above patient had been seen in Physical Therapy 6 times of 7 treatments scheduled with 0 no shows and 1 cancellations.  The treatment consisted of manual, therex. With focus on home program, strengthening posterior chain and lengthening anterior chain. The patient is:  Some goals have improved however patient reports her pain has worsened since starting PT, requests D/C.  Subjective: Patient reports her pain has increased since starting PT, is not working for her, will discharge today. Recommend seeing physician for follow up at this time.  Discharge Findings: Patient met strength and ambulation goals, did not meet FOTO, ROM, or pain goals.   Functional Status at Discharge: Patient is not benefiting from therapy. Will discharge and recommend follow up with physician.   Goals Partially Met    Sincerely,   , PT, DPT    CC No Recipients  [facility] [facility] MAIN REHAB SERVICES [address] [place], [place], [address] Phone: [phone]   Fax:  [phone]  Patient: Rebel M Giller  MRN: [id number]  Date of Birth: 01/15/1971     

## 2020-08-15 ENCOUNTER — Ambulatory Visit: Payer: No Typology Code available for payment source

## 2020-08-24 ENCOUNTER — Other Ambulatory Visit: Payer: Self-pay

## 2020-08-24 ENCOUNTER — Encounter: Payer: Self-pay | Admitting: Obstetrics & Gynecology

## 2020-08-24 ENCOUNTER — Ambulatory Visit (INDEPENDENT_AMBULATORY_CARE_PROVIDER_SITE_OTHER): Payer: No Typology Code available for payment source | Admitting: Obstetrics & Gynecology

## 2020-08-24 VITALS — BP 120/80 | Ht 59.0 in | Wt 179.0 lb

## 2020-08-24 DIAGNOSIS — Z1211 Encounter for screening for malignant neoplasm of colon: Secondary | ICD-10-CM

## 2020-08-24 DIAGNOSIS — Z1231 Encounter for screening mammogram for malignant neoplasm of breast: Secondary | ICD-10-CM | POA: Diagnosis not present

## 2020-08-24 DIAGNOSIS — Z01419 Encounter for gynecological examination (general) (routine) without abnormal findings: Secondary | ICD-10-CM | POA: Diagnosis not present

## 2020-08-24 DIAGNOSIS — R232 Flushing: Secondary | ICD-10-CM

## 2020-08-24 MED ORDER — ESTRADIOL 1 MG PO TABS
1.0000 mg | ORAL_TABLET | Freq: Every day | ORAL | 3 refills | Status: DC
  Filled 2020-08-24: qty 90, 90d supply, fill #0

## 2020-08-24 NOTE — Patient Instructions (Addendum)
PAP every three years Mammogram every year    Call [phone] to schedule at [facility] Colonoscopy every 10 years Labs yearly (with PCP)  Thank you for choosing [facility]. As part of our ongoing efforts to improve patient experience, we would appreciate your feedback. Please fill out the short survey that you will receive by mail or MyChart. Your opinion is important to [place]! - Dr. Kenton Kingfisher  Estradiol Tablets What is this medication? ESTRADIOL (es tra DYE ole) reduces the number and severity of hot flashes due to menopause. It may also help relieve the symptoms of menopause, such as vaginal irritation, dryness, or pain during sex. It can be used to prevent osteoporosis after menopause. It is also used to reduce the symptoms of late-stage breast or prostate cancer. It works by increasing levels of thehormone estrogen in the body. This medication is an estrogen hormone. This medicine may be used for other purposes; ask your health care provider orpharmacist if you have questions. COMMON BRAND NAME(S): Estrace, Femtrace, Gynodiol What should I tell my care team before I take this medication? They need to know if you have or ever had any of these conditions: Abnormal vaginal bleeding Blood vessel disease or blood clots Breast, cervical, endometrial, ovarian, liver, or uterine cancer Dementia Diabetes Gallbladder disease Heart disease or recent heart attack High blood pressure High cholesterol High level of calcium in the blood Hysterectomy Kidney disease Liver disease Migraine headaches Protein C deficiency Protein S deficiency Stroke Systemic lupus erythematosus (SLE) Tobacco smoker An unusual or allergic reaction to estrogens, other hormones, medications, foods, dyes, or preservatives Pregnant or trying to get pregnant Breast-feeding How should I use this medication? Take this medication by mouth. To reduce nausea, this medication may be taken with food. Follow the directions  on the prescription label. Take this medication at the same time each day and in the order directed on the package.Do not take your medication more often than directed. Contact your care team regarding the use of this medication in children.Special care may be needed. A patient package insert for the product will be given with each prescription and refill. Read this sheet carefully each time. The sheet may changefrequently. Overdosage: If you think you have taken too much of this medicine contact apoison control center or emergency room at once. NOTE: This medicine is only for you. Do not share this medicine with others. What if I miss a dose? If you miss a dose, take it as soon as you can. If it is almost time for yournext dose, take only that dose. Do not take double or extra doses. What may interact with this medication? Do not take this medication with any of the following: Aromatase inhibitors like aminoglutethimide, anastrozole, exemestane, letrozole, testolactone This medication may also interact with the following: Carbamazepine Certain antibiotics used to treat infections Certain barbiturates or benzodiazepines used for inducing sleep or treating seizures Grapefruit juice Medications for fungus infections like itraconazole and ketoconazole Raloxifene or tamoxifen Rifabutin, rifampin, or rifapentine Ritonavir St. John's Wort Warfarin This list may not describe all possible interactions. Give your health care provider a list of all the medicines, herbs, non-prescription drugs, or dietary supplements you use. Also tell them if you smoke, drink alcohol, or use illegaldrugs. Some items may interact with your medicine. What should I watch for while using this medication? Visit your care team for regular checks on your progress. You will need a regular breast and pelvic exam and Pap smear while on this medication. You  should also discuss the need for regular mammograms with your care team,  andfollow their guidelines for these tests. This medication can make your body retain fluid, making your fingers, hands, or ankles swell. Your blood pressure can go up. Contact your care team if you feelyou are retaining fluid. If you have any reason to think you are pregnant, stop taking this medicationright away and contact your care team. Smoking increases the risk of getting a blood clot or having a stroke while you are taking this medication, especially if you are more than 51 years old. Youare strongly advised not to smoke. If you wear contact lenses and notice visual changes, or if the lenses begin tofeel uncomfortable, consult your eye care specialists. This medication can increase the risk of developing a condition (endometrial hyperplasia) that may lead to cancer of the lining of the uterus. Taking progestins, another hormone medication, with this medication lowers the risk of developing this condition. Therefore, if your uterus has not been removed (by a hysterectomy), your care team may prescribe a progestin for you to take together with your estrogen. You should know, however, that taking estrogens with progestins may have additional health risks. You should discuss the use of estrogens and progestins with your care team to determine the benefits andrisks for you. If you are going to have surgery, you may need to stop taking this medication.Consult your care team for advice before you schedule the surgery. What side effects may I notice from receiving this medication? Side effects that you should report to your care team as soon as possible: Allergic reactions-skin rash, itching, hives, swelling of the face, lips, tongue, or throat Blood clot-pain, swelling, or warmth in the leg, shortness of breath, chest pain Breast tissue changes, new lumps, redness, pain, or discharge from the nipple Gallbladder problems-severe stomach pain, nausea, vomiting, fever Increase in blood pressure Liver  injury-right upper belly pain, loss of appetite, nausea, light-colored stool, dark yellow or brown urine, yellowing skin or eyes, unusual weakness or fatigue Stroke-sudden numbness or weakness of the face, arm, or leg, trouble speaking, confusion, trouble walking, loss of balance or coordination, dizziness, severe headache, change in vision Unusual vaginal discharge, itching, or odor Vaginal bleeding after menopause, pelvic pain Side effects that usually do not require medical attention (report to your careteam if they continue or are bothersome): Bloating Breast pain or tenderness Hair loss Nausea Stomach pain Vomiting This list may not describe all possible side effects. Call your doctor for medical advice about side effects. You may report side effects to FDA at1-800-FDA-1088. Where should I keep my medication? Keep out of the reach of children and pets. Store at room temperature between 20 and 25 degrees C (68 and 77 degrees F). Keep container tightly closed. Protect from light. Throw away any unusedmedication after the expiration date. NOTE: This sheet is a summary. It may not cover all possible information. If you have questions about this medicine, talk to your doctor, pharmacist, orhealth care provider.  2022 Elsevier/Gold Standard (2020-02-12 12:57:03)

## 2020-08-24 NOTE — Progress Notes (Signed)
HPI:      [name] is a 50 y.o. [phone] who LMP was in the past, she presents today for her annual examination.  Prior [facility].   The patient has no complaints today; still having hot flashes w mild improvement on higher dosing of Clonidine. The patient is sexually active. Herlast pap: approximate date 2020 and was normal and last mammogram: approximate date 2021 and was normal.  The patient does perform self breast exams.  There is no notable family history of breast or ovarian cancer in her family. The patient is not taking hormone replacement therapy. Patient denies post-menopausal vaginal bleeding.   The patient has regular exercise: yes. The patient denies current symptoms of depression.    GYN Hx: Last Colonoscopy: never.   PMHx: Past Medical History:  Diagnosis Date   Anemia    Asthma    well controlled   Complication of anesthesia    Depression    GERD (gastroesophageal reflux disease)    occ   Migraine    Morbid obesity ([place])    Osteoarthritis    PONV (postoperative nausea and vomiting)    with c-section x 1   Past Surgical History:  Procedure Laterality Date   ABDOMINAL HYSTERECTOMY     ABLATION     BREAST CYST ASPIRATION Left    CESAREAN SECTION     CESAREAN SECTION     CESAREAN SECTION     HIP SURGERY  10/2018   LAPAROSCOPY  2018   LEEP     TOTAL HIP ARTHROPLASTY Right 11/12/2018   Procedure: TOTAL HIP ARTHROPLASTY ANTERIOR APPROACH;  Surgeon: Lovell Sheehan, MD;  Location: [facility] ORS;  Service: Orthopedics;  Laterality: Right;   TOTAL VAGINAL HYSTERECTOMY     Family History  Problem Relation Age of Onset   Bipolar disorder Brother    Depression Mother    Breast cancer Neg Hx    Social History   Tobacco Use   Smoking status: Never   Smokeless tobacco: Never  Vaping Use   Vaping Use: Never used  Substance Use Topics   Alcohol use: Yes    Alcohol/week: 0.0 standard drinks    Comment: occasionally   Drug use: No    Current Outpatient  Medications:    albuterol (PROVENTIL) (2.5 MG/3ML) 0.083% nebulizer solution, USE ONE VIAL VIA NEBULIZER EVERY 6 HOURS AS NEEDED FOR WHEEZING OR SHORTNESS OF BREATH., Disp: 75 mL, Rfl: 2   budesonide (PULMICORT) 0.5 MG/2ML nebulizer solution, TAKE 2 MLS BY NEBULIZATION 2 TIMES DAILY., Disp: 60 mL, Rfl: 5   cloNIDine (CATAPRES) 0.2 MG tablet, TAKE 1 TABLET BY MOUTH TWICE DAILY, Disp: 180 tablet, Rfl: 3   Daridorexant HCl (QUVIVIQ) 50 MG TABS, Take 1 tablet by mouth every evening., Disp: 30 tablet, Rfl: 0   desvenlafaxine (PRISTIQ) 50 MG 24 hr tablet, TAKE 1 TABLET BY MOUTH DAILY., Disp: 90 tablet, Rfl: 1   gabapentin (NEURONTIN) 300 MG capsule, Take 2 capsules (600 mg total) by mouth nightly for 90 days, Disp: 180 capsule, Rfl: 3   meloxicam (MOBIC) 15 MG tablet, take one tablet by mouth once daily, Disp: 90 tablet, Rfl: 1   pantoprazole (PROTONIX) 40 MG tablet, Take 1 tablet (40 mg total) by mouth daily., Disp: 30 tablet, Rfl: 0   SUMAtriptan (IMITREX) 100 MG tablet, TAKE 1 TABLET BY MOUTH EVERY 2 HOURS AS NEEDED FOR MIGRAINE. MAY REPEAT IN 2 HOURS IF HEADACHE PERSISTS OR RECURS., Disp: 10 tablet, Rfl: 0   colchicine  0.6 MG tablet, TAKE 1 TABLET BY MOUTH ONCE DAILY, Disp: 30 tablet, Rfl: 1 Allergies: Celecoxib, Prednisone, Topamax [topiramate], Zolpidem, Codeine, and Penicillins  Review of Systems  Constitutional:  Negative for chills, fever and malaise/fatigue.  HENT:  Negative for congestion, sinus pain and sore throat.   Eyes:  Negative for blurred vision and pain.  Respiratory:  Negative for cough and wheezing.   Cardiovascular:  Negative for chest pain and leg swelling.  Gastrointestinal:  Negative for abdominal pain, constipation, diarrhea, heartburn, nausea and vomiting.  Genitourinary:  Negative for dysuria, frequency, hematuria and urgency.  Musculoskeletal:  Negative for back pain, joint pain, myalgias and neck pain.  Skin:  Negative for itching and rash.  Neurological:  Negative for  dizziness, tremors and weakness.  Endo/Heme/Allergies:  Does not bruise/bleed easily.  Psychiatric/Behavioral:  Negative for depression. The patient is not nervous/anxious and does not have insomnia.    Objective: BP 120/80   Ht '4\' 11"'$  (1.499 m)   Wt 179 lb (81.2 kg)   BMI 36.15 kg/m   Filed Weights   08/24/20 0801  Weight: 179 lb (81.2 kg)   Body mass index is 36.15 kg/m. Physical Exam Constitutional:      General: She is not in acute distress.    Appearance: She is well-developed.  Genitourinary:     Vulva, bladder, rectum and urethral meatus normal.     No lesions in the vagina.     Genitourinary Comments: Vaginal cuff well healed     Right Labia: No rash, tenderness or lesions.    Left Labia: No tenderness, lesions or rash.    No vaginal bleeding.      Right Adnexa: not tender and no mass present.    Left Adnexa: not tender and no mass present.    Cervix is absent.     Uterus is absent.     Pelvic exam was performed with patient in the lithotomy position.  Breasts:    Right: No mass, skin change or tenderness.     Left: No mass, skin change or tenderness.  HENT:     Head: Normocephalic and atraumatic. No laceration.     Right Ear: Hearing normal.     Left Ear: Hearing normal.     Mouth/Throat:     Pharynx: Uvula midline.  Eyes:     Pupils: Pupils are equal, round, and reactive to light.  Neck:     Thyroid: No thyromegaly.  Cardiovascular:     Rate and Rhythm: Normal rate and regular rhythm.     Heart sounds: No murmur heard.   No friction rub. No gallop.  Pulmonary:     Effort: Pulmonary effort is normal. No respiratory distress.     Breath sounds: Normal breath sounds. No wheezing.  Abdominal:     General: Bowel sounds are normal. There is no distension.     Palpations: Abdomen is soft.     Tenderness: There is no abdominal tenderness. There is no rebound.  Musculoskeletal:        General: Normal range of motion.     Cervical back: Normal range of motion  and neck supple.  Neurological:     Mental Status: She is alert and oriented to person, place, and time.     Cranial Nerves: No cranial nerve deficit.  Skin:    General: Skin is warm and dry.  Psychiatric:        Judgment: Judgment normal.  Vitals reviewed.    Assessment: Annual Exam  1. Women's annual routine gynecological examination   2. Encounter for screening mammogram for malignant neoplasm of breast   3. Vasomotor flushing     Plan:            1.  Cervical Screening (prior [facility])-  Pap smear schedule reviewed with patient, every 3 years (thus due in 2023)  2. Breast screening- Exam annually and mammogram scheduled  3. Colonoscopy every 10 years, Hemoccult testing after age 41  4. Labs managed by PCP  5. Counseling for hormonal therapy: wants to start ERT or dose due to hot flashes Clonidine effects sub-optimal Black Cohosh - side effects  HRT I have discussed HRT with the patient in detail.  The risk/benefits of it were reviewed.  She understands that during menopause Estrogen decreases dramatically and that this results in an increased risk of cardiovascular disease as well as osteoporosis.  We have also discussed the fact that hot flashes often result from a decrease in Estrogen, and that by replacing Estrogen, they can often be alleviated.  We have discussed skin, vaginal and urinary tract changes that may also take place from this drop in Estrogen.  Emotional changes have also been linked to Estrogen and we have briefly discussed this.  The benefits of HRT including decrease in hot flashes, vaginal dryness, and osteoporosis were discussed.  The emotional benefit and a possible change in her cardiovascular risk profile was also reviewed.  The risks associated with Hormone Replacement Therapy were also reviewed.  The use of unopposed Estrogen and its relationship to endometrial cancer was discussed.  The addition of Progesterone and its beneficial effect on endometrial cancer  was also noted.  The fact that there has been no consistent definitive studies showing an increase in breast cancer in women who use HRT was discussed with the patient.  The possible side effects including breast tenderness, fluid retention, mood changes and vaginal bleeding were discussed.  The patient was informed that this is an elective medication and that she may choose not to take Hormone Replacement Therapy.  Literature on HRT was given, and I believe that after answering all of the patient's questions, she has an adequate and informed understanding of HRT.  Special emphasis on the WHI study, as well as several studies since that pertaining to the risks and benefits of estrogen replacement therapy were compared.  The possible limitations of these studies were discussed including the age stratification of the WHI study.  The possible role of Progesterone in these studies was discussed in detail.  I believe that the patient has an informed knowledge of the risks and benefits of HRT.  I have specifically discussed WHI findings and current updates.  Different type of hormone formulation and methods of taking hormone replacement therapy discussed.                  F/U  Return in about 1 year (around 08/24/2021) for Annual.  Barnett Applebaum, MD, [facility], [facility] 08/24/2020  8:07 AM

## 2020-08-29 ENCOUNTER — Other Ambulatory Visit: Payer: Self-pay

## 2020-08-29 ENCOUNTER — Encounter: Payer: Self-pay | Admitting: Family Medicine

## 2020-08-29 ENCOUNTER — Other Ambulatory Visit: Payer: Self-pay | Admitting: Family Medicine

## 2020-08-29 DIAGNOSIS — R1013 Epigastric pain: Secondary | ICD-10-CM

## 2020-08-29 MED ORDER — NA SULFATE-K SULFATE-MG SULF 17.5-3.13-1.6 GM/177ML PO SOLN
1.0000 | Freq: Once | ORAL | 0 refills | Status: AC
  Filled 2020-08-29: qty 354, 1d supply, fill #0

## 2020-08-29 MED ORDER — PANTOPRAZOLE SODIUM 40 MG PO TBEC
40.0000 mg | DELAYED_RELEASE_TABLET | Freq: Every day | ORAL | 0 refills | Status: DC
  Filled 2020-08-29: qty 90, 90d supply, fill #0

## 2020-09-16 ENCOUNTER — Other Ambulatory Visit: Payer: Self-pay

## 2020-09-16 ENCOUNTER — Ambulatory Visit
Admission: RE | Admit: 2020-09-16 | Discharge: 2020-09-16 | Disposition: A | Discharge status: Home / Self Care | Payer: No Typology Code available for payment source | Source: Ambulatory Visit | Attending: Obstetrics & Gynecology | Admitting: Obstetrics & Gynecology

## 2020-09-16 DIAGNOSIS — Z1231 Encounter for screening mammogram for malignant neoplasm of breast: Secondary | ICD-10-CM | POA: Diagnosis present

## 2020-09-22 ENCOUNTER — Encounter
Admission: RE | Disposition: A | Discharge status: Home / Self Care | Payer: Self-pay | Source: Home / Self Care | Attending: Gastroenterology

## 2020-09-22 ENCOUNTER — Ambulatory Visit: Payer: No Typology Code available for payment source | Admitting: Certified Registered Nurse Anesthetist

## 2020-09-22 ENCOUNTER — Ambulatory Visit
Admission: RE | Admit: 2020-09-22 | Discharge: 2020-09-22 | Disposition: A | Discharge status: Home / Self Care | Payer: No Typology Code available for payment source | Attending: Gastroenterology | Admitting: Gastroenterology

## 2020-09-22 ENCOUNTER — Encounter: Payer: Self-pay | Admitting: Gastroenterology

## 2020-09-22 DIAGNOSIS — Z7951 Long term (current) use of inhaled steroids: Secondary | ICD-10-CM | POA: Insufficient documentation

## 2020-09-22 DIAGNOSIS — Z888 Allergy status to other drugs, medicaments and biological substances status: Secondary | ICD-10-CM | POA: Insufficient documentation

## 2020-09-22 DIAGNOSIS — Z79890 Hormone replacement therapy (postmenopausal): Secondary | ICD-10-CM | POA: Diagnosis not present

## 2020-09-22 DIAGNOSIS — Z791 Long term (current) use of non-steroidal anti-inflammatories (NSAID): Secondary | ICD-10-CM | POA: Diagnosis not present

## 2020-09-22 DIAGNOSIS — D122 Benign neoplasm of ascending colon: Secondary | ICD-10-CM | POA: Insufficient documentation

## 2020-09-22 DIAGNOSIS — Z79899 Other long term (current) drug therapy: Secondary | ICD-10-CM | POA: Insufficient documentation

## 2020-09-22 DIAGNOSIS — Z886 Allergy status to analgesic agent status: Secondary | ICD-10-CM | POA: Diagnosis not present

## 2020-09-22 DIAGNOSIS — K648 Other hemorrhoids: Secondary | ICD-10-CM | POA: Diagnosis not present

## 2020-09-22 DIAGNOSIS — K635 Polyp of colon: Secondary | ICD-10-CM

## 2020-09-22 DIAGNOSIS — Z1211 Encounter for screening for malignant neoplasm of colon: Secondary | ICD-10-CM

## 2020-09-22 DIAGNOSIS — Z96641 Presence of right artificial hip joint: Secondary | ICD-10-CM | POA: Insufficient documentation

## 2020-09-22 DIAGNOSIS — Z880 Allergy status to penicillin: Secondary | ICD-10-CM | POA: Insufficient documentation

## 2020-09-22 DIAGNOSIS — D125 Benign neoplasm of sigmoid colon: Secondary | ICD-10-CM | POA: Insufficient documentation

## 2020-09-22 DIAGNOSIS — Z885 Allergy status to narcotic agent status: Secondary | ICD-10-CM | POA: Insufficient documentation

## 2020-09-22 HISTORY — PX: COLONOSCOPY: SHX5424

## 2020-09-22 SURGERY — COLONOSCOPY
Anesthesia: General

## 2020-09-22 MED ORDER — PROPOFOL 500 MG/50ML IV EMUL
INTRAVENOUS | Status: AC
  Filled 2020-09-22: qty 50

## 2020-09-22 MED ORDER — PROPOFOL 10 MG/ML IV BOLUS
INTRAVENOUS | Status: DC | PRN
  Administered 2020-09-22: 60 mg via INTRAVENOUS

## 2020-09-22 MED ORDER — LIDOCAINE HCL (CARDIAC) PF 100 MG/5ML IV SOSY
PREFILLED_SYRINGE | INTRAVENOUS | Status: DC | PRN
  Administered 2020-09-22: 50 mg via INTRAVENOUS

## 2020-09-22 MED ORDER — PROPOFOL 500 MG/50ML IV EMUL
INTRAVENOUS | Status: DC | PRN
  Administered 2020-09-22: 150 ug/kg/min via INTRAVENOUS

## 2020-09-22 MED ORDER — SODIUM CHLORIDE 0.9 % IV SOLN: INTRAVENOUS | Status: DC

## 2020-09-22 MED ORDER — LIDOCAINE HCL (PF) 2 % IJ SOLN
INTRAMUSCULAR | Status: AC
  Filled 2020-09-22: qty 5

## 2020-09-22 NOTE — Transfer of Care (Signed)
Immediate Anesthesia Transfer of Care Note  Patient: Shelby Henson  Procedure(s) Performed: COLONOSCOPY  Patient Location: PACU and Endoscopy Unit  Anesthesia Type:General  Level of Consciousness: drowsy  Airway & Oxygen Therapy: Patient Spontanous Breathing and Patient connected to nasal cannula oxygen  Post-op Assessment: Report given to RN and Post -op Vital signs reviewed and stable  Post vital signs: Reviewed and stable  Last Vitals:  Vitals Value Taken Time  BP 119/71 09/22/20 0822  Temp    Pulse 81 09/22/20 0822  Resp 21 09/22/20 0822  SpO2 98 % 09/22/20 0822  Vitals shown include unvalidated device data.  Last Pain:  Vitals:   09/22/20 0654  TempSrc: Temporal         Complications: No notable events documented.

## 2020-09-22 NOTE — Anesthesia Postprocedure Evaluation (Signed)
Anesthesia Post Note  Patient: Shelby Henson  Procedure(s) Performed: COLONOSCOPY  Patient location during evaluation: Endoscopy Anesthesia Type: General Level of consciousness: awake and alert Pain management: pain level controlled Vital Signs Assessment: post-procedure vital signs reviewed and stable Respiratory status: spontaneous breathing, nonlabored ventilation, respiratory function stable and patient connected to nasal cannula oxygen Cardiovascular status: blood pressure returned to baseline and stable Postop Assessment: no apparent nausea or vomiting Anesthetic complications: no   No notable events documented.   Last Vitals:  Vitals:   09/22/20 0822 09/22/20 0842  BP: 119/71 137/80  Pulse:    Resp:    Temp: (!) 35.9 C   SpO2:      Last Pain:  Vitals:   09/22/20 0842  TempSrc:   PainSc: 0-No pain                 Martha Clan

## 2020-09-22 NOTE — Op Note (Signed)
[facility] Patient Name: Shelby Henson Procedure Date: 09/22/2020 7:05 AM MRN: [id number] Account #: [id number] Date of Birth: 10-25-1970 Admit Type: Outpatient Age: 50 Room: [facility] ENDO ROOM 2 Gender: Female Note Status: Finalized Procedure:             Colonoscopy Indications:           Screening for colorectal malignant neoplasm Providers:             Selita Staiger B. Bonna Gains MD, MD Medicines:             Monitored Anesthesia Care Complications:         No immediate complications. Procedure:             Pre-Anesthesia Assessment:                        - ASA Grade Assessment: II - A patient with mild                         systemic disease.                        - Prior to the procedure, a History and Physical was                         performed, and patient medications, allergies and                         sensitivities were reviewed. The patient's tolerance                         of previous anesthesia was reviewed.                        - The risks and benefits of the procedure and the                         sedation options and risks were discussed with the                         patient. All questions were answered and informed                         consent was obtained.                        - Patient identification and proposed procedure were                         verified prior to the procedure by the physician, the                         nurse, the anesthesiologist, the anesthetist and the                         technician. The procedure was verified in the                         procedure room.  After obtaining informed consent, the colonoscope was                         passed under direct vision. Throughout the procedure,                         the patient's blood pressure, pulse, and oxygen                         saturations were monitored continuously. The                         Colonoscope was  introduced through the anus and                         advanced to the the cecum, identified by appendiceal                         orifice and ileocecal valve. The colonoscopy was                         performed with ease. The patient tolerated the                         procedure well. The quality of the bowel preparation                         was good. Findings:      The perianal and digital rectal examinations were normal.      A 4 mm polyp was found in the ascending colon. The polyp was sessile.       The polyp was removed with a jumbo cold forceps. Resection and retrieval       were complete.      A 8 mm polyp was found in the sigmoid colon. The polyp was sessile. The       polyp was removed with a cold snare. Resection and retrieval were       complete. For hemostasis, one hemostatic clip was successfully placed.       There was no bleeding at the end of the procedure.      The exam was otherwise without abnormality.      The rectum, sigmoid colon, descending colon, transverse colon, ascending       colon and cecum appeared normal.      Non-bleeding internal hemorrhoids were found during retroflexion. Impression:            - One 4 mm polyp in the ascending colon, removed with                         a jumbo cold forceps. Resected and retrieved.                        - One 8 mm polyp in the sigmoid colon, removed with a                         cold snare. Resected and retrieved. Clip was placed.                        -  The examination was otherwise normal.                        - The rectum, sigmoid colon, descending colon,                         transverse colon, ascending colon and cecum are normal.                        - Non-bleeding internal hemorrhoids. Recommendation:        - Discharge patient to home (with escort).                        - Advance diet as tolerated.                        - Continue present medications.                        - Await pathology  results.                        - Repeat colonoscopy date to be determined after                         pending pathology results are reviewed.                        - The findings and recommendations were discussed with                         the patient.                        - The findings and recommendations were discussed with                         the patient's family.                        - Return to primary care physician as previously                         scheduled.                        - High fiber diet. Procedure Code(s):     --- Professional ---                        [phone], Colonoscopy, flexible; with removal of                         tumor(s), polyp(s), or other lesion(s) by snare                         technique                        [id number], 59, Colonoscopy, flexible; with biopsy, single                         or multiple  Diagnosis Code(s):     --- Professional ---                        Z12.11, Encounter for screening for malignant neoplasm                         of colon                        K63.5, Polyp of colon CPT copyright 2019 American Medical Association. All rights reserved. The codes documented in this report are preliminary and upon coder review may  be revised to meet current compliance requirements.  Vonda Antigua, MD Margretta Sidle B. Bonna Gains MD, MD 09/22/2020 8:28:27 AM This report has been signed electronically. Number of Addenda: 0 Note Initiated On: 09/22/2020 7:05 AM Scope Withdrawal Time: 0 hours 27 minutes 6 seconds  Total Procedure Duration: 0 hours 37 minutes 16 seconds  Estimated Blood Loss:  Estimated blood loss: none.      [facility]

## 2020-09-22 NOTE — Anesthesia Preprocedure Evaluation (Signed)
Anesthesia Evaluation  Patient identified by MRN, date of birth, ID band Patient awake    Reviewed: Allergy & Precautions, H&P , NPO status , Patient's Chart, lab work & pertinent test results, reviewed documented beta blocker date and time   History of Anesthesia Complications (+) PONV and history of anesthetic complications  Airway Mallampati: II   Neck ROM: full    Dental  (+) Poor Dentition, Dental Advidsory Given   Pulmonary neg shortness of breath, asthma , neg COPD, neg recent URI, Patient abstained from smoking.,    Pulmonary exam normal        Cardiovascular Exercise Tolerance: Good negative cardio ROS Normal cardiovascular exam Rhythm:regular Rate:Normal     Neuro/Psych  Headaches, neg Seizures PSYCHIATRIC DISORDERS Depression    GI/Hepatic Neg liver ROS, GERD  Medicated,  Endo/Other  negative endocrine ROS  Renal/GU negative Renal ROS  negative genitourinary   Musculoskeletal   Abdominal   Peds  Hematology  (+) Blood dyscrasia, anemia ,   Anesthesia Other Findings Past Medical History: No date: Anemia No date: Asthma     Comment:  well controlled No date: Complication of anesthesia No date: Depression No date: GERD (gastroesophageal reflux disease)     Comment:  occ No date: Migraine No date: Osteoarthritis No date: PONV (postoperative nausea and vomiting)     Comment:  with c-section x 1 Past Surgical History: No date: ABDOMINAL HYSTERECTOMY No date: ABLATION No date: BREAST CYST ASPIRATION; Left No date: CESAREAN SECTION No date: CESAREAN SECTION No date: CESAREAN SECTION 2018: LAPAROSCOPY No date: LEEP No date: TOTAL VAGINAL HYSTERECTOMY BMI    Body Mass Index: 36.51 kg/m     Reproductive/Obstetrics negative OB ROS                             Anesthesia Physical  Anesthesia Plan  ASA: 2  Anesthesia Plan: General   Post-op Pain Management:     Induction: Intravenous  PONV Risk Score and Plan: 4 or greater and TIVA and Propofol infusion  Airway Management Planned: Natural Airway and Nasal Cannula  Additional Equipment:   Intra-op Plan:   Post-operative Plan:   Informed Consent: I have reviewed the patients History and Physical, chart, labs and discussed the procedure including the risks, benefits and alternatives for the proposed anesthesia with the patient or authorized representative who has indicated his/her understanding and acceptance.     Dental Advisory Given  Plan Discussed with: CRNA  Anesthesia Plan Comments:         Anesthesia Quick Evaluation

## 2020-09-22 NOTE — H&P (Signed)
Shelby Antigua, MD [address], [place], [place], [place], [address] [address], [place], [place], [place], [address] Phone: [phone]  Fax: [phone]  Primary Care Physician:  Steele Sizer, MD   Pre-Procedure History & Physical: HPI:  Shelby Henson is a 50 y.o. female is here for a colonoscopy.   Past Medical History:  Diagnosis Date   Anemia    Asthma    well controlled   Complication of anesthesia    Depression    GERD (gastroesophageal reflux disease)    occ   Migraine    Morbid obesity ([place])    Osteoarthritis    PONV (postoperative nausea and vomiting)    with c-section x 1    Past Surgical History:  Procedure Laterality Date   ABDOMINAL HYSTERECTOMY     ABLATION     BREAST CYST ASPIRATION Left    CESAREAN SECTION     CESAREAN SECTION     CESAREAN SECTION     HIP SURGERY  10/2018   LAPAROSCOPY  2018   LEEP     TOTAL HIP ARTHROPLASTY Right 11/12/2018   Procedure: TOTAL HIP ARTHROPLASTY ANTERIOR APPROACH;  Surgeon: Lovell Sheehan, MD;  Location: [facility] ORS;  Service: Orthopedics;  Laterality: Right;   TOTAL VAGINAL HYSTERECTOMY      Prior to Admission medications   Medication Sig Start Date End Date Taking? Authorizing Provider  meloxicam (MOBIC) 15 MG tablet take one tablet by mouth once daily 07/28/20  Yes Sowles, Drue Stager, MD  pantoprazole (PROTONIX) 40 MG tablet Take 1 tablet (40 mg total) by mouth daily. 08/29/20  Yes Sowles, Drue Stager, MD  SUMAtriptan (IMITREX) 100 MG tablet TAKE 1 TABLET BY MOUTH EVERY 2 HOURS AS NEEDED FOR MIGRAINE. MAY REPEAT IN 2 HOURS IF HEADACHE PERSISTS OR RECURS. 03/23/20 03/23/21 Yes Sowles, Drue Stager, MD  albuterol (PROVENTIL) (2.5 MG/3ML) 0.083% nebulizer solution USE ONE VIAL VIA NEBULIZER EVERY 6 HOURS AS NEEDED FOR WHEEZING OR SHORTNESS OF BREATH. 07/28/20 07/28/21  Sowles, Drue Stager, MD  budesonide (PULMICORT) 0.5 MG/2ML nebulizer solution TAKE 2 MLS BY NEBULIZATION 2 TIMES DAILY. 07/28/20 07/28/21  Steele Sizer, MD   Daridorexant HCl (QUVIVIQ) 50 MG TABS Take 1 tablet by mouth every evening. 07/28/20   Steele Sizer, MD  desvenlafaxine (PRISTIQ) 50 MG 24 hr tablet TAKE 1 TABLET BY MOUTH DAILY. 07/28/20 07/28/21  Steele Sizer, MD  estradiol (ESTRACE) 1 MG tablet Take 1 tablet (1 mg total) by mouth daily. 08/24/20   Gae Dry, MD  gabapentin (NEURONTIN) 300 MG capsule Take 2 capsules (600 mg total) by mouth nightly for 90 days 07/20/20       Allergies as of 08/25/2020 - Review Complete 08/24/2020  Allergen Reaction Noted   Celecoxib Hives 09/03/2017   Prednisone  06/25/2014   Topamax [topiramate]  09/03/2017   Zolpidem  08/08/2014   Codeine Rash 11/17/2013   Penicillins Rash 11/17/2013    Family History  Problem Relation Age of Onset   Bipolar disorder Brother    Depression Mother    Breast cancer Neg Hx     Social History   Socioeconomic History   Marital status: Divorced    Spouse name: Not on file   Number of children: 3   Years of education: Not on file   Highest education level: Not on file  Occupational History   Not on file  Tobacco Use   Smoking status: Never   Smokeless tobacco: Never  Vaping Use   Vaping Use: Never used  Substance and Sexual Activity   Alcohol use: Yes    Alcohol/week: 0.0 standard drinks    Comment: occasionally   Drug use: No   Sexual activity: Not Currently    Comment: boyfriend unable  Other Topics Concern   Not on file  Social History Narrative   Not on file   Social Determinants of Health   Financial Resource Strain: Not on file  Food Insecurity: Not on file  Transportation Needs: Not on file  Physical Activity: Not on file  Stress: Not on file  Social Connections: Not on file  Intimate Partner Violence: Not on file    Review of Systems: See HPI, otherwise negative ROS  Physical Exam: Constitutional: General:   Alert,  Well-developed, well-nourished, pleasant and cooperative in NAD BP (!) 144/93   Pulse 77   Temp (!) 96.9  F (36.1 C) (Temporal)   Resp 16   Ht '4\' 11"'$  (1.499 m)   Wt 73.5 kg   SpO2 98%   BMI 32.72 kg/m   Head: Normocephalic, atraumatic.   Eyes:  Sclera clear, no icterus.   Conjunctiva pink.   Mouth:  No deformity or lesions, oropharynx pink & moist.  Neck:  Supple, trachea midline  Respiratory: Normal respiratory effort  Gastrointestinal:  Soft, non-tender and non-distended without masses, hepatosplenomegaly or hernias noted.  No guarding or rebound tenderness.     Cardiac: No clubbing or edema.  No cyanosis. Normal posterior tibial pedal pulses noted.  Lymphatic:  No significant cervical adenopathy.  Psych:  Alert and cooperative. Normal mood and affect.  Musculoskeletal:   Symmetrical without gross deformities. 5/5 Lower extremity strength bilaterally.  Skin: Warm. Intact without significant lesions or rashes. No jaundice.  Neurologic:  Face symmetrical, tongue midline, Normal sensation to touch;  grossly normal neurologically.  Psych:  Alert and oriented x3, Alert and cooperative. Normal mood and affect.  Impression/Plan: Shelby Henson is here for a colonoscopy to be performed for average risk screening.  Risks, benefits, limitations, and alternatives regarding  colonoscopy have been reviewed with the patient.  Questions have been answered.  All parties agreeable.   Virgel Manifold, MD  09/22/2020, 7:34 AM

## 2020-09-23 ENCOUNTER — Encounter: Payer: Self-pay | Admitting: Gastroenterology

## 2020-09-23 LAB — SURGICAL PATHOLOGY

## 2020-09-26 ENCOUNTER — Encounter: Payer: Self-pay | Admitting: Gastroenterology

## 2020-09-28 ENCOUNTER — Other Ambulatory Visit: Payer: Self-pay

## 2020-10-24 ENCOUNTER — Encounter: Payer: Self-pay | Admitting: Family Medicine

## 2020-10-24 ENCOUNTER — Other Ambulatory Visit: Payer: Self-pay

## 2020-10-24 ENCOUNTER — Other Ambulatory Visit: Payer: Self-pay | Admitting: Family Medicine

## 2020-10-24 MED ORDER — OZEMPIC (0.25 OR 0.5 MG/DOSE) 2 MG/1.5ML ~~LOC~~ SOPN
0.5000 mg | PEN_INJECTOR | SUBCUTANEOUS | 0 refills | Status: DC
  Filled 2020-10-24: qty 4.5, 84d supply, fill #0

## 2020-10-27 ENCOUNTER — Encounter: Payer: Self-pay | Admitting: Physical Therapy

## 2020-10-27 ENCOUNTER — Telehealth (INDEPENDENT_AMBULATORY_CARE_PROVIDER_SITE_OTHER): Payer: No Typology Code available for payment source | Admitting: Obstetrics & Gynecology

## 2020-10-27 ENCOUNTER — Ambulatory Visit: Payer: No Typology Code available for payment source | Attending: Neurology | Admitting: Physical Therapy

## 2020-10-27 ENCOUNTER — Encounter: Payer: Self-pay | Admitting: Obstetrics & Gynecology

## 2020-10-27 ENCOUNTER — Other Ambulatory Visit: Payer: Self-pay

## 2020-10-27 DIAGNOSIS — R2689 Other abnormalities of gait and mobility: Secondary | ICD-10-CM | POA: Diagnosis present

## 2020-10-27 DIAGNOSIS — R232 Flushing: Secondary | ICD-10-CM | POA: Diagnosis not present

## 2020-10-27 DIAGNOSIS — R208 Other disturbances of skin sensation: Secondary | ICD-10-CM | POA: Diagnosis present

## 2020-10-27 DIAGNOSIS — M25551 Pain in right hip: Secondary | ICD-10-CM | POA: Insufficient documentation

## 2020-10-27 MED ORDER — CLONIDINE HCL 0.2 MG PO TABS
0.2000 mg | ORAL_TABLET | Freq: Every day | ORAL | 3 refills | Status: DC
  Filled 2020-10-27: qty 90, 90d supply, fill #0
  Filled 2021-03-07: qty 90, 90d supply, fill #1
  Filled 2021-04-24: qty 90, 90d supply, fill #2

## 2020-10-27 NOTE — Therapy (Signed)
[place] MAIN [facility] SERVICES [address] [place], [place], [address] Phone: [phone]   Fax:  [phone]  Physical Therapy Evaluation  Patient Details  Name: Shelby Henson MRN: [id number] Date of Birth: 23-May-1970 Referring Provider (PT): Jennings Books, MD   Encounter Date: 10/27/2020   PT End of Session - 10/27/20 0926     Visit Number 1    Number of Visits 10    Date for PT Re-Evaluation 01/05/21    PT Start Time 0900    PT Stop Time 1000    PT Time Calculation (min) 60 min             Past Medical History:  Diagnosis Date   Anemia    Asthma    well controlled   Complication of anesthesia    Depression    GERD (gastroesophageal reflux disease)    occ   Migraine    Morbid obesity ([place])    Osteoarthritis    PONV (postoperative nausea and vomiting)    with c-section x 1    Past Surgical History:  Procedure Laterality Date   ABDOMINAL HYSTERECTOMY     ABLATION     BREAST CYST ASPIRATION Left    CESAREAN SECTION     CESAREAN SECTION     CESAREAN SECTION     COLONOSCOPY N/A 09/22/2020   Procedure: COLONOSCOPY;  Surgeon: Virgel Manifold, MD;  Location: [facility] ENDOSCOPY;  Service: Endoscopy;  Laterality: N/A;   HIP SURGERY  10/2018   LAPAROSCOPY  2018   LEEP     TOTAL HIP ARTHROPLASTY Right 11/12/2018   Procedure: TOTAL HIP ARTHROPLASTY ANTERIOR APPROACH;  Surgeon: Lovell Sheehan, MD;  Location: [facility] ORS;  Service: Orthopedics;  Laterality: Right;   TOTAL VAGINAL HYSTERECTOMY      There were no vitals filed for this visit.    Subjective Assessment - 10/27/20 0914     Subjective Pt had R THA 11/12/2018. Pt completed PT after 6-8 months and it was still hurting after PT.    1) Dull, achey pain occurs constantly (level 4/10 , at worst 6-7/10)  in standing, sitting, walking, sleep on both sides, treadmill walking.    2) Lateral thigh has "bee stings"-like sensation and  burning sensation,stabbing which started  after surgery and has not worsened. This sesnsation occurs when working out and dissappears after workout.  pt is seeing Dr. Manuella Ghazi and has a f/u appt for nerve testing on 11/08/20.   Sleeping on her back is more comfortable. Pain eases with ice, weight squats (20 #) . Pt sits for long hours of work. Pt works out at [employer] 3 x week. Weight training with machines adductors/abductors , leg ext, curls, ellliptical, upper body with free weights, planks.. Pt is not doing crunches.   3) R plantar fascitiis started 10 years ago and it got better. Just last week, it started back after walking treadmill.    4) R groin pain when leaning on R leg and that going off of it, it feels stuck.  2/10    Pertinent History 3 C sesctions, abdominal hysterectomy, R THA ,  Hip debridement prior to THA    Patient Stated Goals pain to go away                [facility] PT Assessment - 10/27/20 0931       Assessment   Medical Diagnosis R hip pain    Referring Provider (PT) Jennings Books, MD  Precautions   Precautions None      Restrictions   Weight Bearing Restrictions No      Balance Screen   Has the patient fallen in the past 6 months No      Prior Function   Level of Independence Independent      Sensation   Light Touch Impaired Detail   decreased sensation L3, 4, 5 on R   Additional Comments area of bee sting/ burning along lateral thigh  from iliac crest to midthigh ( 32 cm x 14cm)      Strength   Overall Strength Comments LLE 4+/5, R 5/5, hip abd / glu 4-/5 B      Palpation   SI assessment  Pre Tx: R iliac rest higher, POst shoe lift in L shoe: levelled    Palpation comment severely restricted scar over anterior/lateral area, C section scar restrictions over supra pubic      Ambulation/Gait   Gait velocity pre Tx: 0.82 m/s postTx 1.3 m/s    Gait Comments pre Tx: antalgic gait, decreased stance phase on RLE, limited knee /hip flexion, heel striking, post Tx: more reciprocal gait, equal WBing                         Objective measurements completed on examination: See above findings.     Pelvic Floor Special Questions - 10/27/20 2325     Diastasis Recti neg              [facility] Adult PT Treatment/Exercise - 10/27/20 0931       Therapeutic Activites    Therapeutic Activities Work [employer]    Work Simulation folding towel in chair to widen trunk/ leg and advised not to place foot on stool due to deep hip angle      Neuro Re-ed    Neuro Re-ed Details  cued for scar massage      Modalities   Modalities Moist Heat      Moist Heat Therapy   Number Minutes Moist Heat 5 Minutes    Moist Heat Location --   R hip but heat caused increased pain, plan to withhold heat     Manual Therapy   Manual therapy comments fascial massage, scar mobilization over scar and suprapubic area                          PT Long Term Goals - 10/27/20 0927       PT LONG TERM GOAL #1   Title Pt will be able to stand and lean on R leg decreased groin pain by 50% and move off the leg in order to move in teh community    Time 8    Period Weeks    Status New    Target Date 12/22/20      PT LONG TERM GOAL #2   Title Pt will be able to sleep on her sides without interruption of R hip pain across 2 nights in order to improve QOL    Time 10    Period Weeks    Status New    Target Date 01/05/21      PT LONG TERM GOAL #3   Title Pt will notice decreased bee-sting, burning sensation by < 50% in intensity and area from ( 32 cm x 14cm) on R thigh   after workout in order to continue workout    Time 6  Period Weeks    Status New    Target Date 12/08/20      PT LONG TERM GOAL #4   Title Pt will increase her walking speed from 0.82 m/s  to > 1.0 m/s in order to cross the street faster safer pace    Time 10    Period Weeks    Status New    Target Date 01/05/21      PT LONG TERM GOAL #5   Title Pt will improve FOTO score from pts to  pt in order to improve  QOL / ADLs    Time 10    Period Weeks    Status New    Target Date 01/05/21      Additional Long Term Goals   Additional Long Term Goals --      PT LONG TERM GOAL #6   Title Pt will report no R plantar fasciits pain during and after walking on treadmill 20 min    Time 6    Period Weeks    Status New    Target Date 12/08/20                    Plan - 10/27/20 2329     Clinical Impression Statement  Pt is a  51  yo  who presents with status post R THA pain for 2 years. Pt had completed 6-8 months of PT. Pt currently experiences  dull, achey pain, sensation deficits at R anterolateral hip, R groin pain, R plantar fascitiis. Theses deficits impact her gait, standing, and gym workouts.   Pt's musculoskeletal assessment revealed  Uneven leg length, gait deviations,   limited spinal /pelvic mobility, weak hip weakness, scar restrictions over anterolateral thigh and suprapubic area,  decreased sensation, poor body mechanics at work station.   Regional interdependent approaches will yield greater benefits in pt's POC due to the complexity of pt's medical Hx and the significant impact their Sx have had on their QOL. Pt would benefit from a biopsychosocial approach to yield optimal outcomes. Plan to build interdisciplinary team with pt's providers to optimize patient-centered care.   Following Tx today which pt tolerated without complaints, pt demo'd equal alignment of pelvic girdle with shoe lift in L shoe, more reciprocal gait pattern, increased gait speed,  and increased spinal mobility. Scar mobilizations were applied to surgical and C section scars to improve fascial mobility and sensation. Work station modifications were provided. Pt benefits from skilled P.     Examination-Activity Limitations Sit;Sleep;Squat;Stand;Lift    Stability/Clinical Decision Making Evolving/Moderate complexity    Clinical Decision Making Moderate    Rehab Potential Good    PT Frequency 1x / week     PT Duration Other (comment)   10   PT Treatment/Interventions ADLs/Self Care Home Management;Functional mobility training;Therapeutic activities;Moist Heat;Traction;Therapeutic exercise;Gait training;Balance training;Neuromuscular re-education;Manual lymph drainage;Manual techniques;Energy conservation;Splinting;Scar mobilization;Taping;Stair training;Cryotherapy;Patient/family education;Joint Manipulations    Consulted and Agree with Plan of Care Patient             Patient will benefit from skilled therapeutic intervention in order to improve the following deficits and impairments:  Abnormal gait, Decreased balance, Decreased endurance, Decreased mobility, Decreased activity tolerance, Decreased coordination, Decreased safety awareness, Decreased strength, Decreased scar mobility, Postural dysfunction, Pain, Improper body mechanics, Increased muscle spasms, Difficulty walking, Impaired sensation, Increased fascial restricitons, Decreased range of motion, Hypomobility  Visit Diagnosis: Pain in right hip  Other abnormalities of gait and mobility  Other disturbances of skin sensation  Problem List Patient Active Problem List   Diagnosis Date Noted   Special screening for malignant neoplasms, colon    Polyp of ascending colon    Polyp of sigmoid colon    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 03/23/2020   Pain in right hip 01/12/2019   Vitamin D deficiency 02/25/2018   Chronic pain 10/24/2015   Hot flashes 10/24/2015   Migraine without aura and without status migrainosus, not intractable 34/03/5246   Metabolic syndrome 18/59/0931   Dyslipidemia 10/24/2015   Allergic rhinitis, seasonal 05/17/2015   Asthma, mild intermittent 08/08/2014   Chronic insomnia 08/08/2014   DDD (degenerative disc disease), lumbar 08/08/2014   Mood disorder ([place]) 08/08/2014   Gastro-esophageal reflux disease without esophagitis 08/08/2014   H/O total hysterectomy 08/08/2014    Irritable bowel syndrome with diarrhea 08/08/2014   Osteoarthritis of right hip 08/08/2014   Adult BMI 30+ 08/08/2014   SBO (spina bifida occulta) 08/08/2014    Jerl Mina, PT 10/27/2020, 11:33 PM  [place] MAIN [facility] SERVICES [address] [place], [place], [address] Phone: [phone]   Fax:  [phone]  Name: Shelby Henson MRN: [id number] Date of Birth: Mar 05, 1970

## 2020-10-27 NOTE — Patient Instructions (Signed)
Shoe lift in L shoe   _  Gentle hand pressure on scar, rock knees   Apply Vit E to scar

## 2020-10-27 NOTE — Progress Notes (Signed)
Virtual Visit via Telephone Note  I connected with Shelby Henson on 10/27/20 at  8:00 AM EDT by telephone and verified that I am speaking with the correct person using two identifiers.  Location: Patient: Home Provider: Office   I discussed the limitations, risks, security and privacy concerns of performing an evaluation and management service by telephone and the availability of in person appointments. I also discussed with the patient that there may be a patient responsible charge related to this service. The patient expressed understanding and agreed to proceed.   History of Present Illness:  Shelby Henson is a 50 y.o. who was started on Estrace 1 mg tab daily  approximately 2 months ago. Since that time, she states that her symptoms were mildly affected but is concerned over side effect of weight gain.  Prior Clonidine helped some, but made her sleepy.  However she has restarted that as it did help  PMHx: She  has a past medical history of Anemia, Asthma, Complication of anesthesia, Depression, GERD (gastroesophageal reflux disease), Migraine, Morbid obesity ([place]), Osteoarthritis, and PONV (postoperative nausea and vomiting). Also,  has a past surgical history that includes Abdominal hysterectomy; Cesarean section; Cesarean section; Ablation; LEEP; Total vaginal hysterectomy; laparoscopy (2018); Breast cyst aspiration (Left); Cesarean section; Total hip arthroplasty (Right, 11/12/2018); Hip surgery (10/2018); and Colonoscopy (N/A, 09/22/2020)., family history includes Bipolar disorder in her brother; Depression in her mother.,  reports that she has never smoked. She has never used smokeless tobacco. She reports current alcohol use. She reports that she does not use drugs. Current Meds  Medication Sig   cloNIDine (CATAPRES) 0.2 MG tablet Take 1 tablet (0.2 mg total) by mouth daily.  . Also, is allergic to celecoxib, prednisone, topamax [topiramate], zolpidem, codeine, and  penicillins..  Review of Systems  All other systems reviewed and are negative.    Observations/Objective: No exam today, due to telephone eVisit due to [facility] virus restriction on elective visits and procedures.  Prior visits reviewed along with ultrasounds/labs as indicated.  Assessment and Plan:   ICD-10-CM   1. Vasomotor flushing  R23.2 cloNIDine (CATAPRES) 0.2 MG tablet     Will change back to Clonidine, and consider dose change or patch change.  Monitor for side effects.  Discussed alternatives, such as ERT (feels she has weight gain side effects, but may benefit from patch over pill therapy), Gabapentin (already taking for other reasons), SSRIs (concern for weight gain here as well), Black Cohosh (has tried in past without optimal relief).  Follow Up Instructions: As needed   I discussed the assessment and treatment plan with the patient. The patient was provided an opportunity to ask questions and all were answered. The patient agreed with the plan and demonstrated an understanding of the instructions.   The patient was advised to call back or seek an in-person evaluation if the symptoms worsen or if the condition fails to improve as anticipated.  I provided 15 minutes of non-face-to-face time during this encounter.   Hoyt Koch, MD

## 2020-11-01 ENCOUNTER — Other Ambulatory Visit: Payer: Self-pay

## 2020-11-01 ENCOUNTER — Ambulatory Visit: Payer: No Typology Code available for payment source | Attending: Family Medicine | Admitting: Physical Therapy

## 2020-11-01 DIAGNOSIS — R262 Difficulty in walking, not elsewhere classified: Secondary | ICD-10-CM | POA: Diagnosis present

## 2020-11-01 DIAGNOSIS — R2689 Other abnormalities of gait and mobility: Secondary | ICD-10-CM | POA: Insufficient documentation

## 2020-11-01 DIAGNOSIS — R208 Other disturbances of skin sensation: Secondary | ICD-10-CM | POA: Insufficient documentation

## 2020-11-01 DIAGNOSIS — M6281 Muscle weakness (generalized): Secondary | ICD-10-CM | POA: Insufficient documentation

## 2020-11-01 DIAGNOSIS — M25551 Pain in right hip: Secondary | ICD-10-CM | POA: Insufficient documentation

## 2020-11-01 NOTE — Patient Instructions (Signed)
Standing posture  Shift navel forward slightly, shift weight across heel and ball mound, unlock knees   Equal weight between balmounds and heels but the heels only    ___    WALKING WITH RESISTANCE BLUE Band at waist connected to doorknob 11mins Stepping forward normal length steps, planting mid and forefoot down, center of mass ( navel) leans forward slightly as if you were walking uphill 3-4 steps till band feels taut ( MAKE SURE THE DOOR IS LOCKED AND WON'T OPEN)   Stepping backwards, lower heel slowly, carry trunk and hips back , leaning forward, front knee along 2-3 rd toe line    2 min  ____

## 2020-11-02 NOTE — Therapy (Signed)
[place] MAIN [facility] SERVICES [address] [place], [place], [address] Phone: [phone]   Fax:  [phone]  Physical Therapy Treatment  Patient Details  Name: Shelby Henson MRN: [id number] Date of Birth: Jun 21, 1970 Referring Provider (PT): Jennings Books, MD   Encounter Date: 11/01/2020   PT End of Session - 11/01/20 1814     Visit Number 2    Number of Visits 10    Date for PT Re-Evaluation 01/05/21    PT Start Time 6962    PT Stop Time 1817    PT Time Calculation (min) 62 min             Past Medical History:  Diagnosis Date   Anemia    Asthma    well controlled   Complication of anesthesia    Depression    GERD (gastroesophageal reflux disease)    occ   Migraine    Morbid obesity ([place])    Osteoarthritis    PONV (postoperative nausea and vomiting)    with c-section x 1    Past Surgical History:  Procedure Laterality Date   ABDOMINAL HYSTERECTOMY     ABLATION     BREAST CYST ASPIRATION Left    CESAREAN SECTION     CESAREAN SECTION     CESAREAN SECTION     COLONOSCOPY N/A 09/22/2020   Procedure: COLONOSCOPY;  Surgeon: Virgel Manifold, MD;  Location: [facility] ENDOSCOPY;  Service: Endoscopy;  Laterality: N/A;   HIP SURGERY  10/2018   LAPAROSCOPY  2018   LEEP     TOTAL HIP ARTHROPLASTY Right 11/12/2018   Procedure: TOTAL HIP ARTHROPLASTY ANTERIOR APPROACH;  Surgeon: Lovell Sheehan, MD;  Location: [facility] ORS;  Service: Orthopedics;  Laterality: Right;   TOTAL VAGINAL HYSTERECTOMY      There were no vitals filed for this visit.   Subjective Assessment - 11/01/20 1715     Subjective Pt reported her pain is always the same. Nothing has changed. The heat caused more pain. Not propping feet up on stool was a bit better for her hip.  R plantar fasciitis has been 7/10    Pertinent History 3 C sesctions, abdominal hysterectomy, R THA ,  Hip debridement prior to THA    Patient Stated Goals pain to go away                 [facility] PT Assessment - 11/02/20 1356       Prior Function   Level of Independence --      Sensation   Light Touch Impaired Detail   decreased sensation L3, 4, 5 on R   Additional Comments --      Other:   Other/ Comments hyperextension of knees, heel strike walking, heel WBing      Strength   Overall Strength Comments --      Palpation   SI assessment  levelled pelvis    Palpation comment severely restricted R surgical  scar, fascial restrictions over suprapubic area, tightness along intrinsic feet , limited toes abduction, tenderness at plantar fascia      Ambulation/Gait   Gait velocity pre Tx: 0.82 m/s postTx 1.3 m/s    Gait Comments pre Tx: antalgic gait, decreased stance phase on RLE, limited knee /hip flexion, heel striking, post Tx: more reciprocal gait, equal WBing  [facility] Adult PT Treatment/Exercise - 11/02/20 1356       Neuro Re-ed    Neuro Re-ed Details  cued for lower kientic chain to minimize heel striking, hyperextension of knees in HEP and stance      Manual Therapy   Manual therapy comments fascial massage, scar mobilization over scar and suprapubic area and surgurical scar, STM/MWM at L foot to promote toe abduction and midfoot flexibity                       PT Short Term Goals - 08/08/20 0724       PT SHORT TERM GOAL #1   Title Patient will be independent and adherent with HEP at least 3x a week for better LE flexibility and strength;    Baseline compliant    Time 2    Period Weeks    Status Achieved    Target Date 08/01/20               PT Long Term Goals - 10/27/20 0927       PT LONG TERM GOAL #1   Title Pt will be able to stand and lean on R leg decreased groin pain by 50% and move off the leg in order to move in teh community    Time 8    Period Weeks    Status New    Target Date 12/22/20      PT LONG TERM GOAL #2   Title Pt will be able to sleep on her sides without  interruption of R hip pain across 2 nights in order to improve QOL    Time 10    Period Weeks    Status New    Target Date 01/05/21      PT LONG TERM GOAL #3   Title Pt will notice decreased bee-sting, burning sensation by < 50% in intensity and area from ( 32 cm x 14cm) on R thigh   after workout in order to continue workout    Time 6    Period Weeks    Status New    Target Date 12/08/20      PT LONG TERM GOAL #4   Title Pt will increase her walking speed from 0.82 m/s  to > 1.0 m/s in order to cross the street faster safer pace    Time 10    Period Weeks    Status New    Target Date 01/05/21      PT LONG TERM GOAL #5   Title Pt will improve FOTO score from pts to  pt in order to improve QOL / ADLs    Time 10    Period Weeks    Status New    Target Date 01/05/21      Additional Long Term Goals   Additional Long Term Goals Yes      PT LONG TERM GOAL #6   Title Pt will report no R plantar fasciits pain during and after walking on treadmill 20 min    Time 6    Period Weeks    Status New    Target Date 12/08/20      PT LONG TERM GOAL #7   Title Pt will report bilateral sensation along supra pubic line and increased sensation along THA scar in order to perform gym workouts    Baseline decreased R pubic line, sensation along THA scar    Time 10    Period Weeks  Status New    Target Date 01/05/21                   Plan - 11/02/20 1411     Clinical Impression Statement Pt demo'd equal pelvic alignment with shoe lift. Pt continued to require manual Tx to minimize scar restrictions and also required manual Tx to promote more L foot mobility. Pt required excessive cues to minimize heel striking, hyperextension of knees and promote more toe abduction and foot mobilty to minimize plantar fasciitis.  Pt will continues to benefit from skilled PT. Plan to continue with same Tx as pt will benefit from more fascial mobility and lower kinetic chain training.      Examination-Activity Limitations Sit;Sleep;Squat;Stand;Lift    Stability/Clinical Decision Making Evolving/Moderate complexity    Rehab Potential Good    PT Frequency 1x / week    PT Duration Other (comment)   10   PT Treatment/Interventions ADLs/Self Care Home Management;Functional mobility training;Therapeutic activities;Moist Heat;Traction;Therapeutic exercise;Gait training;Balance training;Neuromuscular re-education;Manual lymph drainage;Manual techniques;Energy conservation;Splinting;Scar mobilization;Taping;Stair training;Cryotherapy;Patient/family education;Joint Manipulations    Consulted and Agree with Plan of Care Patient             Patient will benefit from skilled therapeutic intervention in order to improve the following deficits and impairments:  Abnormal gait, Decreased balance, Decreased endurance, Decreased mobility, Decreased activity tolerance, Decreased coordination, Decreased safety awareness, Decreased strength, Decreased scar mobility, Postural dysfunction, Pain, Improper body mechanics, Increased muscle spasms, Difficulty walking, Impaired sensation, Increased fascial restricitons, Decreased range of motion, Hypomobility  Visit Diagnosis: Pain in right hip  Other abnormalities of gait and mobility  Other disturbances of skin sensation  Difficulty in walking, not elsewhere classified  Muscle weakness (generalized)     Problem List Patient Active Problem List   Diagnosis Date Noted   Special screening for malignant neoplasms, colon    Polyp of ascending colon    Polyp of sigmoid colon    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 03/23/2020   Pain in right hip 01/12/2019   Vitamin D deficiency 02/25/2018   Chronic pain 10/24/2015   Hot flashes 10/24/2015   Migraine without aura and without status migrainosus, not intractable 65/99/3570   Metabolic syndrome 17/79/3903   Dyslipidemia 10/24/2015   Allergic rhinitis, seasonal  05/17/2015   Asthma, mild intermittent 08/08/2014   Chronic insomnia 08/08/2014   DDD (degenerative disc disease), lumbar 08/08/2014   Mood disorder ([place]) 08/08/2014   Gastro-esophageal reflux disease without esophagitis 08/08/2014   H/O total hysterectomy 08/08/2014   Irritable bowel syndrome with diarrhea 08/08/2014   Osteoarthritis of right hip 08/08/2014   Adult BMI 30+ 08/08/2014   SBO (spina bifida occulta) 08/08/2014    Jerl Mina, PT 11/02/2020, 2:11 PM  [place] [address] [place], [place], [address] Phone: [phone]   Fax:  [phone]  Name: Shelby Henson MRN: [id number] Date of Birth: 1970/10/31

## 2020-11-04 ENCOUNTER — Other Ambulatory Visit: Payer: Self-pay

## 2020-11-07 ENCOUNTER — Ambulatory Visit: Payer: No Typology Code available for payment source | Admitting: Physical Therapy

## 2020-11-07 ENCOUNTER — Other Ambulatory Visit: Payer: Self-pay

## 2020-11-07 DIAGNOSIS — M25551 Pain in right hip: Secondary | ICD-10-CM

## 2020-11-07 DIAGNOSIS — R208 Other disturbances of skin sensation: Secondary | ICD-10-CM

## 2020-11-07 DIAGNOSIS — M6281 Muscle weakness (generalized): Secondary | ICD-10-CM

## 2020-11-07 DIAGNOSIS — R2689 Other abnormalities of gait and mobility: Secondary | ICD-10-CM

## 2020-11-07 DIAGNOSIS — R262 Difficulty in walking, not elsewhere classified: Secondary | ICD-10-CM

## 2020-11-08 ENCOUNTER — Other Ambulatory Visit: Payer: Self-pay

## 2020-11-08 MED ORDER — GABAPENTIN 300 MG PO CAPS
ORAL_CAPSULE | ORAL | 3 refills | Status: DC
  Filled 2020-11-08 – 2020-11-28 (×2): qty 180, 60d supply, fill #0
  Filled 2021-03-07: qty 180, 60d supply, fill #1
  Filled 2021-05-04: qty 180, 60d supply, fill #2
  Filled 2021-07-13: qty 180, 60d supply, fill #3

## 2020-11-08 NOTE — Therapy (Signed)
[place] MAIN [facility] SERVICES [address] [place], [place], [address] Phone: [phone]   Fax:  [phone]  Physical Therapy Treatment  Patient Details  Name: Shelby Henson MRN: [id number] Date of Birth: 1970/12/26 Referring Provider (PT): Jennings Books, MD   Encounter Date: 11/07/2020   PT End of Session - 11/08/20 1246     Visit Number 3    Number of Visits 10    Date for PT Re-Evaluation 01/05/21    PT Start Time 3300    PT Stop Time 1806    PT Time Calculation (min) 61 min             Past Medical History:  Diagnosis Date   Anemia    Asthma    well controlled   Complication of anesthesia    Depression    GERD (gastroesophageal reflux disease)    occ   Migraine    Morbid obesity ([place])    Osteoarthritis    PONV (postoperative nausea and vomiting)    with c-section x 1    Past Surgical History:  Procedure Laterality Date   ABDOMINAL HYSTERECTOMY     ABLATION     BREAST CYST ASPIRATION Left    CESAREAN SECTION     CESAREAN SECTION     CESAREAN SECTION     COLONOSCOPY N/A 09/22/2020   Procedure: COLONOSCOPY;  Surgeon: Virgel Manifold, MD;  Location: [facility] ENDOSCOPY;  Service: Endoscopy;  Laterality: N/A;   HIP SURGERY  10/2018   LAPAROSCOPY  2018   LEEP     TOTAL HIP ARTHROPLASTY Right 11/12/2018   Procedure: TOTAL HIP ARTHROPLASTY ANTERIOR APPROACH;  Surgeon: Lovell Sheehan, MD;  Location: [facility] ORS;  Service: Orthopedics;  Laterality: Right;   TOTAL VAGINAL HYSTERECTOMY      There were no vitals filed for this visit.   Subjective Assessment - 11/07/20 1703     Subjective Pt reported decreased pain from 6-7/10 to 3/10   but burning sensation persisted. Both pain and burning sensation got better.  On Saturday, pt experienced more pain and lasted all day and night. Today , pain is 3/10 and had no burning seensation today. Pt has appt with Dr. Manuella Ghazi tomorrow and will begoing over the nerve conduction test.     Pertinent History 3 C sesctions, abdominal hysterectomy, R THA ,  Hip debridement prior to THA    Patient Stated Goals pain to go away                [facility] PT Assessment - 11/08/20 1241       Sensation   Additional Comments sharp distinction around scar intact post Tx  but not with dull, soft from scar to mid thigh      Other:   Other/ Comments poor COM propicoeption with lower kinetic chain , hyperextension of knees      AROM   Overall AROM Comments improved DF, toe abduction R      Palpation   Palpation comment tightness along peroneal longus/brevis, ant tib R                           [facility] Adult PT Treatment/Exercise - 11/08/20 1246       Neuro Re-ed    Neuro Re-ed Details  cued for lower kientic chain to minimize heel striking, hyperextension of knees in HEP and stance      Manual Therapy   Manual  therapy comments fascial massage, scar mobilization over scar and suprapubic area and surgurical scar, STM/MWM at L foot to promote lateral leg/ knee flexibity                       PT Short Term Goals - 08/08/20 0724       PT SHORT TERM GOAL #1   Title Patient will be independent and adherent with HEP at least 3x a week for better LE flexibility and strength;    Baseline compliant    Time 2    Period Weeks    Status Achieved    Target Date 08/01/20               PT Long Term Goals - 10/27/20 0927       PT LONG TERM GOAL #1   Title Pt will be able to stand and lean on R leg decreased groin pain by 50% and move off the leg in order to move in teh community    Time 8    Period Weeks    Status New    Target Date 12/22/20      PT LONG TERM GOAL #2   Title Pt will be able to sleep on her sides without interruption of R hip pain across 2 nights in order to improve QOL    Time 10    Period Weeks    Status New    Target Date 01/05/21      PT LONG TERM GOAL #3   Title Pt will notice decreased bee-sting, burning sensation by <  50% in intensity and area from ( 32 cm x 14cm) on R thigh   after workout in order to continue workout    Time 6    Period Weeks    Status New    Target Date 12/08/20      PT LONG TERM GOAL #4   Title Pt will increase her walking speed from 0.82 m/s  to > 1.0 m/s in order to cross the street faster safer pace    Time 10    Period Weeks    Status New    Target Date 01/05/21      PT LONG TERM GOAL #5   Title Pt will improve FOTO score from pts to  pt in order to improve QOL / ADLs    Time 10    Period Weeks    Status New    Target Date 01/05/21      Additional Long Term Goals   Additional Long Term Goals Yes      PT LONG TERM GOAL #6   Title Pt will report no R plantar fasciits pain during and after walking on treadmill 20 min    Time 6    Period Weeks    Status New    Target Date 12/08/20      PT LONG TERM GOAL #7   Title Pt will report bilateral sensation along supra pubic line and increased sensation along THA scar in order to perform gym workouts    Baseline decreased R pubic line, sensation along THA scar    Time 10    Period Weeks    Status New    Target Date 01/05/21                   Plan - 11/08/20 1247     Clinical Impression Statement Pt responded positively to last sessions' Tx and shoe lift  as pt reported less pain 6/10 to 3/10  and  no burning sensation today.   Continued to apply manual Tx to address fascial mobility and increasing mobility at lower kinetic chain. Pt was able to distinguish sharp sensation around scar but not with dull, soft textures. Continued to provide excessive cues for less hyperextension of knees and more propiopcetion for WBing on single leg with forward and backward walking . Pt continues to benefit from skilled PT.    Examination-Activity Limitations Sit;Sleep;Squat;Stand;Lift    Stability/Clinical Decision Making Evolving/Moderate complexity    Rehab Potential Good    PT Frequency 1x / week    PT Duration Other  (comment)   10   PT Treatment/Interventions ADLs/Self Care Home Management;Functional mobility training;Therapeutic activities;Moist Heat;Traction;Therapeutic exercise;Gait training;Balance training;Neuromuscular re-education;Manual lymph drainage;Manual techniques;Energy conservation;Splinting;Scar mobilization;Taping;Stair training;Cryotherapy;Patient/family education;Joint Manipulations    Consulted and Agree with Plan of Care Patient             Patient will benefit from skilled therapeutic intervention in order to improve the following deficits and impairments:  Abnormal gait, Decreased balance, Decreased endurance, Decreased mobility, Decreased activity tolerance, Decreased coordination, Decreased safety awareness, Decreased strength, Decreased scar mobility, Postural dysfunction, Pain, Improper body mechanics, Increased muscle spasms, Difficulty walking, Impaired sensation, Increased fascial restricitons, Decreased range of motion, Hypomobility  Visit Diagnosis: Pain in right hip  Other abnormalities of gait and mobility  Other disturbances of skin sensation  Difficulty in walking, not elsewhere classified  Muscle weakness (generalized)     Problem List Patient Active Problem List   Diagnosis Date Noted   Special screening for malignant neoplasms, colon    Polyp of ascending colon    Polyp of sigmoid colon    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 03/23/2020   Pain in right hip 01/12/2019   Vitamin D deficiency 02/25/2018   Chronic pain 10/24/2015   Hot flashes 10/24/2015   Migraine without aura and without status migrainosus, not intractable 21/97/5883   Metabolic syndrome 25/49/8264   Dyslipidemia 10/24/2015   Allergic rhinitis, seasonal 05/17/2015   Asthma, mild intermittent 08/08/2014   Chronic insomnia 08/08/2014   DDD (degenerative disc disease), lumbar 08/08/2014   Mood disorder ([place]) 08/08/2014   Gastro-esophageal reflux disease  without esophagitis 08/08/2014   H/O total hysterectomy 08/08/2014   Irritable bowel syndrome with diarrhea 08/08/2014   Osteoarthritis of right hip 08/08/2014   Adult BMI 30+ 08/08/2014   SBO (spina bifida occulta) 08/08/2014    Jerl Mina, PT 11/08/2020, 12:47 PM  [place] MAIN [facility] SERVICES [address] [place], [place], [address] Phone: [phone]   Fax:  [phone]  Name: Shelby Henson MRN: [id number] Date of Birth: 09-Jun-1970

## 2020-11-09 ENCOUNTER — Other Ambulatory Visit: Payer: Self-pay

## 2020-11-09 ENCOUNTER — Other Ambulatory Visit: Payer: Self-pay | Admitting: Family Medicine

## 2020-11-09 DIAGNOSIS — R1013 Epigastric pain: Secondary | ICD-10-CM

## 2020-11-09 MED ORDER — PANTOPRAZOLE SODIUM 40 MG PO TBEC
40.0000 mg | DELAYED_RELEASE_TABLET | Freq: Every day | ORAL | 0 refills | Status: DC
  Filled 2020-11-09: qty 90, 90d supply, fill #0

## 2020-11-10 ENCOUNTER — Other Ambulatory Visit: Payer: Self-pay

## 2020-11-15 ENCOUNTER — Ambulatory Visit: Payer: No Typology Code available for payment source | Admitting: Physical Therapy

## 2020-11-17 ENCOUNTER — Ambulatory Visit: Payer: No Typology Code available for payment source | Admitting: Physical Therapy

## 2020-11-24 ENCOUNTER — Encounter: Payer: No Typology Code available for payment source | Admitting: Physical Therapy

## 2020-11-24 ENCOUNTER — Other Ambulatory Visit: Payer: Self-pay

## 2020-11-28 ENCOUNTER — Other Ambulatory Visit: Payer: Self-pay

## 2020-11-29 ENCOUNTER — Encounter: Payer: No Typology Code available for payment source | Admitting: Physical Therapy

## 2020-11-30 ENCOUNTER — Other Ambulatory Visit: Payer: Self-pay

## 2020-11-30 ENCOUNTER — Ambulatory Visit: Payer: No Typology Code available for payment source | Attending: Neurology | Admitting: Physical Therapy

## 2020-11-30 DIAGNOSIS — R262 Difficulty in walking, not elsewhere classified: Secondary | ICD-10-CM | POA: Insufficient documentation

## 2020-11-30 DIAGNOSIS — R208 Other disturbances of skin sensation: Secondary | ICD-10-CM | POA: Insufficient documentation

## 2020-11-30 DIAGNOSIS — M533 Sacrococcygeal disorders, not elsewhere classified: Secondary | ICD-10-CM | POA: Diagnosis not present

## 2020-11-30 DIAGNOSIS — R2689 Other abnormalities of gait and mobility: Secondary | ICD-10-CM | POA: Diagnosis present

## 2020-11-30 DIAGNOSIS — M25551 Pain in right hip: Secondary | ICD-10-CM | POA: Diagnosis not present

## 2020-11-30 DIAGNOSIS — M6281 Muscle weakness (generalized): Secondary | ICD-10-CM | POA: Insufficient documentation

## 2020-11-30 NOTE — Therapy (Signed)
[facility] SERVICES [address] [place], [place], [address] Phone: [phone]   Fax:  [phone]  Physical Therapy Treatment  Patient Details  Name: Shelby Henson MRN: [id number] Date of Birth: 13-Feb-1970 Referring Provider (PT): Jennings Books, MD   Encounter Date: 11/30/2020   PT End of Session - 11/30/20 1001     Visit Number 4    Number of Visits 10    Date for PT Re-Evaluation 01/05/21    PT Start Time 0904    PT Stop Time 1000    PT Time Calculation (min) 56 min             Past Medical History:  Diagnosis Date   Anemia    Asthma    well controlled   Complication of anesthesia    Depression    GERD (gastroesophageal reflux disease)    occ   Migraine    Morbid obesity ([place])    Osteoarthritis    PONV (postoperative nausea and vomiting)    with c-section x 1    Past Surgical History:  Procedure Laterality Date   ABDOMINAL HYSTERECTOMY     ABLATION     BREAST CYST ASPIRATION Left    CESAREAN SECTION     CESAREAN SECTION     CESAREAN SECTION     COLONOSCOPY N/A 09/22/2020   Procedure: COLONOSCOPY;  Surgeon: Virgel Manifold, MD;  Location: [facility] ENDOSCOPY;  Service: Endoscopy;  Laterality: N/A;   HIP SURGERY  10/2018   LAPAROSCOPY  2018   LEEP     TOTAL HIP ARTHROPLASTY Right 11/12/2018   Procedure: TOTAL HIP ARTHROPLASTY ANTERIOR APPROACH;  Surgeon: Lovell Sheehan, MD;  Location: [facility] ORS;  Service: Orthopedics;  Laterality: Right;   TOTAL VAGINAL HYSTERECTOMY      There were no vitals filed for this visit.   Subjective Assessment - 11/30/20 0909     Subjective Pt reported she has experienced  burning sensation has moved up lateral hip (6/10) intermittent and still at the midthigh 4-5/10 pain intermittently.  This new area of burning sensation started last Friday 11/25/20 and pt had the same workout routine that week. Pt did not go to the gym until Sunday. The pain did not change with workout. Pt  had stopped taking Gabapentin taking last week prior to the new burning pain at the hip and didnt it for 3-4 days because it was affecting her mood. Pt did not tell her doctor about not taking it. Pt started to take the medication again and it is helping with her burning pain. Since last PT session, pt reports the same with her Sx.  Pt has not been feeling R foot pain and has changed shoes. Pt also took out the shoe lift out of L shoe last week, prior to the R burning hip pain.    Pertinent History 3 C sesctions, abdominal hysterectomy, R THA ,  Hip debridement prior to THA    Patient Stated Goals pain to go away                [facility] PT Assessment - 11/30/20 1017       Palpation   SI assessment  R iliac crest higher than L ( pt stopped wearing shoe lift last week when she switched shoes)    Palpation comment tightness at ischial tuberosity attachments, hamstring R, no more tightness along peroneal longus/brevis, ant tib R  [facility] PT Treatment/Exercise - 11/30/20 1017       Therapeutic Activites    Therapeutic Activities --   compliance HEP education, explained the role of feet improvement and walking mechanics to hip pain     Neuro Re-ed    Neuro Re-ed Details  sensory retraining      Manual Therapy   Manual therapy comments STM/MWM sustained pressure for ER over thigh, anterior rotation of ilia, R                          PT Long Term Goals - 10/27/20 0927       PT LONG TERM GOAL #1   Title Pt will be able to stand and lean on R leg decreased groin pain by 50% and move off the leg in order to move in teh community    Time 8    Period Weeks    Status New    Target Date 12/22/20      PT LONG TERM GOAL #2   Title Pt will be able to sleep on her sides without interruption of R hip pain across 2 nights in order to improve QOL    Time 10    Period Weeks    Status New    Target Date 01/05/21      PT LONG TERM  GOAL #3   Title Pt will notice decreased bee-sting, burning sensation by < 50% in intensity and area from ( 32 cm x 14cm) on R thigh   after workout in order to continue workout    Time 6    Period Weeks    Status New    Target Date 12/08/20      PT LONG TERM GOAL #4   Title Pt will increase her walking speed from 0.82 m/s  to > 1.0 m/s in order to cross the street faster safer pace    Time 10    Period Weeks    Status New    Target Date 01/05/21      PT LONG TERM GOAL #5   Title Pt will improve FOTO score from pts to  pt in order to improve QOL / ADLs    Time 10    Period Weeks    Status New    Target Date 01/05/21      Additional Long Term Goals   Additional Long Term Goals Yes      PT LONG TERM GOAL #6   Title Pt will report no R plantar fasciits pain during and after walking on treadmill 20 min    Time 6    Period Weeks    Status New    Target Date 12/08/20      PT LONG TERM GOAL #7   Title Pt will report bilateral sensation along supra pubic line and increased sensation along THA scar in order to perform gym workouts    Baseline decreased R pubic line, sensation along THA scar    Time 10    Period Weeks    Status New    Target Date 01/05/21                   Plan - 11/30/20 1001     Clinical Impression Statement Pt returned after 2 weeks since last session. Scar restrictions appeared less restricted and R foot/ leg mm mobility was present which indicate past manual Tx have helped. Pt reports no more R foot  pain.    Pt stated a new R hip burning pain at ASIS level that started last week when she stopped wearing shoe lift and stopped taking Gabapentin. Pt was explained the importance of wearing shoe lift and advised pt to communicate with MD when she wants to stop taking medication. Pt voiced understanding to communicate with  MD on medication changes.   Pt demo'd uneven hip height.   After Tx today, Pt reported decreased burning pain at R hip from 6/10 to  2/10. Shoe lift was put in to L shoe because pt reported she stopped wearing the lift last week after which pt started feeling new area of burning pain at R hip. Suspect this new area of pain was due to not wearing the shoe lift. After shoe was placed, pt reported her hip felt better.  Initiated sensory retraining with dull, sharp, soft ( kleenex, glove). Pt was not able to distinguish sharp around scar. Pt was able to distinguish pressure, dull, soft. Pt continues to benefit from skilled PT.  Pt was provided education on the importance of compliance to HEP as pt reported she practiced HEP once a week. Pt voiced understanding and plans to perform her HEP daily.    Examination-Activity Limitations Sit;Sleep;Squat;Stand;Lift    Stability/Clinical Decision Making Evolving/Moderate complexity    Rehab Potential Good    PT Frequency 1x / week    PT Duration Other (comment)   10   PT Treatment/Interventions ADLs/Self Care Home Management;Functional mobility training;Therapeutic activities;Moist Heat;Traction;Therapeutic exercise;Gait training;Balance training;Neuromuscular re-education;Manual lymph drainage;Manual techniques;Energy conservation;Splinting;Scar mobilization;Taping;Stair training;Cryotherapy;Patient/family education;Joint Manipulations    Consulted and Agree with Plan of Care Patient             Patient will benefit from skilled therapeutic intervention in order to improve the following deficits and impairments:  Abnormal gait, Decreased balance, Decreased endurance, Decreased mobility, Decreased activity tolerance, Decreased coordination, Decreased safety awareness, Decreased strength, Decreased scar mobility, Postural dysfunction, Pain, Improper body mechanics, Increased muscle spasms, Difficulty walking, Impaired sensation, Increased fascial restricitons, Decreased range of motion, Hypomobility  Visit Diagnosis: Pain in right hip  Other abnormalities of gait and mobility  Other  disturbances of skin sensation  Difficulty in walking, not elsewhere classified  Muscle weakness (generalized)     Problem List Patient Active Problem List   Diagnosis Date Noted   Special screening for malignant neoplasms, colon    Polyp of ascending colon    Polyp of sigmoid colon    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 03/23/2020   Pain in right hip 01/12/2019   Vitamin D deficiency 02/25/2018   Chronic pain 10/24/2015   Hot flashes 10/24/2015   Migraine without aura and without status migrainosus, not intractable 34/74/2595   Metabolic syndrome 63/87/5643   Dyslipidemia 10/24/2015   Allergic rhinitis, seasonal 05/17/2015   Asthma, mild intermittent 08/08/2014   Chronic insomnia 08/08/2014   DDD (degenerative disc disease), lumbar 08/08/2014   Mood disorder ([place]) 08/08/2014   Gastro-esophageal reflux disease without esophagitis 08/08/2014   H/O total hysterectomy 08/08/2014   Irritable bowel syndrome with diarrhea 08/08/2014   Osteoarthritis of right hip 08/08/2014   Adult BMI 30+ 08/08/2014   SBO (spina bifida occulta) 08/08/2014    Jerl Mina, PT 11/30/2020, 10:42 AM  [place], [place], [address] Phone: [phone]   Fax:  [phone]  Name: MARVELOUS WOOLFORD MRN: [id number] Date of Birth: [birth year]

## 2020-11-30 NOTE — Patient Instructions (Addendum)
Communicate with your doctor anytime you want to change your medication or stop taking and following their recommendations on how to make these changes . Do not stop taking medications without talking to your doctor first   --   Please keep shoe lift in L shoe lifelong  __   Keep up the walking band exercise daily

## 2020-12-06 ENCOUNTER — Ambulatory Visit: Payer: No Typology Code available for payment source | Admitting: Physical Therapy

## 2020-12-06 ENCOUNTER — Other Ambulatory Visit: Payer: Self-pay

## 2020-12-06 DIAGNOSIS — M25551 Pain in right hip: Secondary | ICD-10-CM

## 2020-12-06 DIAGNOSIS — R262 Difficulty in walking, not elsewhere classified: Secondary | ICD-10-CM

## 2020-12-06 DIAGNOSIS — R208 Other disturbances of skin sensation: Secondary | ICD-10-CM

## 2020-12-06 DIAGNOSIS — R2689 Other abnormalities of gait and mobility: Secondary | ICD-10-CM

## 2020-12-06 DIAGNOSIS — M6281 Muscle weakness (generalized): Secondary | ICD-10-CM

## 2020-12-06 NOTE — Therapy (Addendum)
[facility] SERVICES [address] [place], [place], [address] Phone: [phone]   Fax:  [phone]  Physical Therapy Treatment  Patient Details  Name: Shelby Henson MRN: [id number] Date of Birth: 12-Sep-1970 Referring Provider (PT): Jennings Books, MD   Encounter Date: 12/06/2020   PT End of Session - 12/06/20 1510     Visit Number 5   Number of Visits 10    Date for PT Re-Evaluation 01/05/21    PT Start Time 4970    PT Stop Time 1600    PT Time Calculation (min) 55 min             Past Medical History:  Diagnosis Date   Anemia    Asthma    well controlled   Complication of anesthesia    Depression    GERD (gastroesophageal reflux disease)    occ   Migraine    Morbid obesity ([place])    Osteoarthritis    PONV (postoperative nausea and vomiting)    with c-section x 1    Past Surgical History:  Procedure Laterality Date   ABDOMINAL HYSTERECTOMY     ABLATION     BREAST CYST ASPIRATION Left    CESAREAN SECTION     CESAREAN SECTION     CESAREAN SECTION     COLONOSCOPY N/A 09/22/2020   Procedure: COLONOSCOPY;  Surgeon: Virgel Manifold, MD;  Location: [facility] ENDOSCOPY;  Service: Endoscopy;  Laterality: N/A;   HIP SURGERY  10/2018   LAPAROSCOPY  2018   LEEP     TOTAL HIP ARTHROPLASTY Right 11/12/2018   Procedure: TOTAL HIP ARTHROPLASTY ANTERIOR APPROACH;  Surgeon: Lovell Sheehan, MD;  Location: [facility] ORS;  Service: Orthopedics;  Laterality: Right;   TOTAL VAGINAL HYSTERECTOMY      There were no vitals filed for this visit.   Subjective Assessment - 12/06/20 1510     Subjective Pt reported the burning sensation is back at the side of hip. No change to the intensity . Still at 6-7/10. Pt has not worn the shoe lift since last session because she did not know that the shoe lifts can be removed.    Pertinent History 3 C sesctions, abdominal hysterectomy, R THA ,  Hip debridement prior to THA    Patient Stated Goals  pain to go away                [facility] PT Assessment - 12/06/20 1812       Observation/Other Assessments   Observations simualted fitness activities with posterior COM, sometimes hyperextended knees,      Palpation   SI assessment  R iliac crest higher than L ( pt stopped wearing shoe lift last week when she switched shoes)                           [facility] Adult PT Treatment/Exercise - 12/06/20 1813       Therapeutic Activites    Other Therapeutic Activities explained the importance of wearing shoe lift for the next 8 days until next session,   provided shoe lift, advised to wear sandals at home with shoe lift and not go barefoot to be consistent this week with shoe lift wear     Neuro Re-ed    Neuro Re-ed Details  cued for stretches for lower kinetic chain, cued for techniques to optimize deep core with fitness routine      Exercises  Other Exercises  explained stretches and which mm group the stretches are for, guided graded movement, cued for stretches before point of pain                          PT Long Term Goals - 10/27/20 0927       PT LONG TERM GOAL #1   Title Pt will be able to stand and lean on R leg decreased groin pain by 50% and move off the leg in order to move in teh community    Time 8    Period Weeks    Status New    Target Date 12/22/20      PT LONG TERM GOAL #2   Title Pt will be able to sleep on her sides without interruption of R hip pain across 2 nights in order to improve QOL    Time 10    Period Weeks    Status New    Target Date 01/05/21      PT LONG TERM GOAL #3   Title Pt will notice decreased bee-sting, burning sensation by < 50% in intensity and area from ( 32 cm x 14cm) on R thigh   after workout in order to continue workout    Time 6    Period Weeks    Status New    Target Date 12/08/20      PT LONG TERM GOAL #4   Title Pt will increase her walking speed from 0.82 m/s  to > 1.0 m/s in order to  cross the street faster safer pace    Time 10    Period Weeks    Status New    Target Date 01/05/21      PT LONG TERM GOAL #5   Title Pt will improve FOTO score from pts to  pt in order to improve QOL / ADLs    Time 10    Period Weeks    Status New    Target Date 01/05/21      Additional Long Term Goals   Additional Long Term Goals Yes      PT LONG TERM GOAL #6   Title Pt will report no R plantar fasciits pain during and after walking on treadmill 20 min    Time 6    Period Weeks    Status New    Target Date 12/08/20      PT LONG TERM GOAL #7   Title Pt will report bilateral sensation along supra pubic line and increased sensation along THA scar in order to perform gym workouts    Baseline decreased R pubic line, sensation along THA scar    Time 10    Period Weeks    Status New    Target Date 01/05/21                   Plan - 12/06/20 1814     Clinical Impression Statement Pt was educated to maintain compliance with shoe lift wear in L LE due to higher RLE as pt did not wear it since last session because pt was not aware that she can put them into other shoes when she switched to other shoes. Pt was provided another shoe lift in her sandals and was advised to wear sandals at home until next session instead of being barefoot.   Provided cues for co-activation of deep core mm with fitness routine. Pt was guided through stretches for  lower kinetic chain with yoga strap after which pt reported decreased pain from 6/10 to 4/10. Pt voiced more motivation with stretches pre and post workout and after work.  Pt continues to benefit from skilled PT.    Examination-Activity Limitations Sit;Sleep;Squat;Stand;Lift    Stability/Clinical Decision Making Evolving/Moderate complexity    Rehab Potential Good    PT Frequency 1x / week    PT Duration Other (comment)   10   PT Treatment/Interventions ADLs/Self Care Home Management;Functional mobility training;Therapeutic  activities;Moist Heat;Traction;Therapeutic exercise;Gait training;Balance training;Neuromuscular re-education;Manual lymph drainage;Manual techniques;Energy conservation;Splinting;Scar mobilization;Taping;Stair training;Cryotherapy;Patient/family education;Joint Manipulations    Consulted and Agree with Plan of Care Patient             Patient will benefit from skilled therapeutic intervention in order to improve the following deficits and impairments:  Abnormal gait, Decreased balance, Decreased endurance, Decreased mobility, Decreased activity tolerance, Decreased coordination, Decreased safety awareness, Decreased strength, Decreased scar mobility, Postural dysfunction, Pain, Improper body mechanics, Increased muscle spasms, Difficulty walking, Impaired sensation, Increased fascial restricitons, Decreased range of motion, Hypomobility  Visit Diagnosis: Pain in right hip  Other abnormalities of gait and mobility  Other disturbances of skin sensation  Difficulty in walking, not elsewhere classified  Muscle weakness (generalized)     Problem List Patient Active Problem List   Diagnosis Date Noted   Special screening for malignant neoplasms, colon    Polyp of ascending colon    Polyp of sigmoid colon    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 03/23/2020   Pain in right hip 01/12/2019   Vitamin D deficiency 02/25/2018   Chronic pain 10/24/2015   Hot flashes 10/24/2015   Migraine without aura and without status migrainosus, not intractable 16/10/9602   Metabolic syndrome 54/09/8117   Dyslipidemia 10/24/2015   Allergic rhinitis, seasonal 05/17/2015   Asthma, mild intermittent 08/08/2014   Chronic insomnia 08/08/2014   DDD (degenerative disc disease), lumbar 08/08/2014   Mood disorder ([place]) 08/08/2014   Gastro-esophageal reflux disease without esophagitis 08/08/2014   H/O total hysterectomy 08/08/2014   Irritable bowel syndrome with diarrhea  08/08/2014   Osteoarthritis of right hip 08/08/2014   Adult BMI 30+ 08/08/2014   SBO (spina bifida occulta) 08/08/2014    Jerl Mina, PT 12/06/2020, 6:16 PM  [facility] [address] [place], [place], [address] Phone: [phone]   Fax:  [phone]  Name: Shelby Henson MRN: [id number] Date of Birth: 05-25-1970

## 2020-12-06 NOTE — Patient Instructions (Addendum)
Wear shoes with shoe lift consistently  Stretch routine before and after workout  and also after work :  Stretches : (Cuing provided for proper alignment)  Instructions start with Strap on R    Stretches for your legs: LAYING on Back Use upper arms and elbows for stability when pulling strap Opposite knee bent and foot firm in align with hip   Strap on ballmound:  Hip socket  _strap, L knee bent, R ballmound against strap and spread toes, rolling foot 15 deg out and in across midline.  10 reps each side   Hamstring _knee bends  10 reps  With knee pointing straight ( slightly to outside to minimize snapping sensation)      10 reps with knee pointing out towards armpit ( notice the stretch in the medial hamstring muscle)     IT band  _scoot hips to R, cross R leg over L and straighten knee with strap on [place],   Bend knee back and forth 5x    Quad in sidelying _strap around the ankle, pulling ankle towards buttocks  Bottom leg firm and stabilization with knee bent  Adductors and pelvic floor ( [place]) : Delane Ginger are wide towards armpits, sole of feet towards ceiling   On your back again:  Strap under R thigh Hip abductors ( figure 4)      ,L ankle over R thigh ( stretching L glut)     5  Breaths    ___  Standing with ski track stance when using weights   Exhale on lift

## 2020-12-12 ENCOUNTER — Ambulatory Visit: Payer: No Typology Code available for payment source | Admitting: Physical Therapy

## 2020-12-19 ENCOUNTER — Ambulatory Visit: Payer: No Typology Code available for payment source | Admitting: Physical Therapy

## 2020-12-26 ENCOUNTER — Other Ambulatory Visit: Payer: Self-pay

## 2020-12-26 ENCOUNTER — Ambulatory Visit: Payer: No Typology Code available for payment source | Admitting: Physical Therapy

## 2020-12-26 DIAGNOSIS — M6281 Muscle weakness (generalized): Secondary | ICD-10-CM

## 2020-12-26 DIAGNOSIS — R208 Other disturbances of skin sensation: Secondary | ICD-10-CM

## 2020-12-26 DIAGNOSIS — R262 Difficulty in walking, not elsewhere classified: Secondary | ICD-10-CM

## 2020-12-26 DIAGNOSIS — R2689 Other abnormalities of gait and mobility: Secondary | ICD-10-CM

## 2020-12-26 DIAGNOSIS — M25551 Pain in right hip: Secondary | ICD-10-CM

## 2020-12-26 NOTE — Therapy (Signed)
[place] MAIN [facility] SERVICES [address] [place], [place], [address] Phone: [phone]   Fax:  [phone]  Physical Therapy Treatment  Patient Details  Name: Shelby Henson MRN: [id number] Date of Birth: 06-Mar-1970 Referring Provider (PT): Jennings Books, MD   Encounter Date: 12/26/2020   PT End of Session - 12/26/20 0958     Visit Number 6    Number of Visits 10    Date for PT Re-Evaluation 01/05/21    PT Start Time 0904    PT Stop Time 1000    PT Time Calculation (min) 56 min             Past Medical History:  Diagnosis Date   Anemia    Asthma    well controlled   Complication of anesthesia    Depression    GERD (gastroesophageal reflux disease)    occ   Migraine    Morbid obesity ([place])    Osteoarthritis    PONV (postoperative nausea and vomiting)    with c-section x 1    Past Surgical History:  Procedure Laterality Date   ABDOMINAL HYSTERECTOMY     ABLATION     BREAST CYST ASPIRATION Left    CESAREAN SECTION     CESAREAN SECTION     CESAREAN SECTION     COLONOSCOPY N/A 09/22/2020   Procedure: COLONOSCOPY;  Surgeon: Virgel Manifold, MD;  Location: [facility] ENDOSCOPY;  Service: Endoscopy;  Laterality: N/A;   HIP SURGERY  10/2018   LAPAROSCOPY  2018   LEEP     TOTAL HIP ARTHROPLASTY Right 11/12/2018   Procedure: TOTAL HIP ARTHROPLASTY ANTERIOR APPROACH;  Surgeon: Lovell Sheehan, MD;  Location: [facility] ORS;  Service: Orthopedics;  Laterality: Right;   TOTAL VAGINAL HYSTERECTOMY      There were no vitals filed for this visit.   Subjective Assessment - 12/26/20 0906     Subjective Pt reported she kept wearing her shoe lift for the past 2 weeks and it has made a huge difference with decreasing pain and the numbness/ burning. Pt also added the bike into her routine with her trainer. Pt has done her leg stretche with the strap 2x within the past 2 weeks. Pt reports her overall R leg and thigh pain has not been hurting  as bad. pain level has been 1-2/10 even at the end of the day. "I noticed a big change within teh last two weeks". Pt has been doing stretches at her desk in the chair.    Pertinent History 3 C sesctions, abdominal hysterectomy, R THA ,  Hip debridement prior to THA    Patient Stated Goals pain to go away                [facility] PT Assessment - 12/26/20 0910       Sensation   Additional Comments Able to distinguish sensation bilaterally L2-3 but decreased along L5 ( lateral leg). Sharp distinction: not able to distinguish close to the scar      Single Leg Stance   Comments RLE caused pain when lifting the R foot off floor to shift to Lhip SLS, there is a "catch in the groin"      Palpation   SI assessment  iliac crest levelled    Palpation comment restrictions along distal scar, mm attachments at greater trochanter and ITBand.     Ambulation/Gait   Gait velocity 1.5 m/s    Gait Comments normal gait, no  limping                           [facility] Adult PT Treatment/Exercise - 12/26/20 0910       Therapeutic Activites    Other Therapeutic Activities explained compliance with stretches after workout to optimize mobility , explained deep core HEP will be helpful given Hx of 3 C section scars but presribing when pt voices her readiness for it      Exercises   Other Exercises  reviewed strap stretches      Manual Therapy   Manual therapy comments STM/MWM / fascial mobilization over ab scars/ thigh scar, ITband/ GH attachments                          PT Long Term Goals - 10/27/20 0927       PT LONG TERM GOAL #1   Title Pt will be able to stand and lean on R leg decreased groin pain by 50% and move off the leg in order to move in teh community    Time 8    Period Weeks    Status New    Target Date 12/22/20      PT LONG TERM GOAL #2   Title Pt will be able to sleep on her sides without interruption of R hip pain across 2 nights in order to  improve QOL    Time 10    Period Weeks    Status New    Target Date 01/05/21      PT LONG TERM GOAL #3   Title Pt will notice decreased bee-sting, burning sensation by < 50% in intensity and area from ( 32 cm x 14cm) on R thigh   after workout in order to continue workout    Time 6    Period Weeks    Status New    Target Date 12/08/20      PT LONG TERM GOAL #4   Title Pt will increase her walking speed from 0.82 m/s  to > 1.0 m/s in order to cross the street faster safer pace    Time 10    Period Weeks    Status New    Target Date 01/05/21      PT LONG TERM GOAL #5   Title Pt will improve FOTO score from pts to  pt in order to improve QOL / ADLs    Time 10    Period Weeks    Status New    Target Date 01/05/21      Additional Long Term Goals   Additional Long Term Goals Yes      PT LONG TERM GOAL #6   Title Pt will report no R plantar fasciits pain during and after walking on treadmill 20 min    Time 6    Period Weeks    Status New    Target Date 12/08/20      PT LONG TERM GOAL #7   Title Pt will report bilateral sensation along supra pubic line and increased sensation along THA scar in order to perform gym workouts    Baseline decreased R pubic line, sensation along THA scar    Time 10    Period Weeks    Status New    Target Date 01/05/21                   Plan - 12/26/20  0959     Clinical Impression Statement Pt has made significant improvements.  Pt's gait speed has increased from 0.8 m/s to 1.5 m/s today. Sensation is improving bilaterally but sharp/ dull distinction is still an issue.   Pt reported decreased pain to 1-2/10 level and decreased burning sensation along R thigh. Pt has been compliant with wearing her shoe lift which has been "huge help".   Today, pt showed decreasing scar restrictions along thigh but still required manual Tx to minimize remaining restrictions and also along mm attachments at greater trochanter and ITBand. Pt also showed  low ab scars from 2 C-sections. Anticipate these improvements will continue to help pt shift off R LE to stand on LLE SLS Reinforced compliance with HEP. Plan to add deep core HEP next session so pt can build compliance with her BLE stretches more this week before adding another exercise set to HEP.     Pt benefit from skilled PT.    Examination-Activity Limitations Sit;Sleep;Squat;Stand;Lift    Stability/Clinical Decision Making Evolving/Moderate complexity    Rehab Potential Good    PT Frequency 1x / week    PT Duration Other (comment)   10   PT Treatment/Interventions ADLs/Self Care Home Management;Functional mobility training;Therapeutic activities;Moist Heat;Traction;Therapeutic exercise;Gait training;Balance training;Neuromuscular re-education;Manual lymph drainage;Manual techniques;Energy conservation;Splinting;Scar mobilization;Taping;Stair training;Cryotherapy;Patient/family education;Joint Manipulations    Consulted and Agree with Plan of Care Patient             Patient will benefit from skilled therapeutic intervention in order to improve the following deficits and impairments:  Abnormal gait, Decreased balance, Decreased endurance, Decreased mobility, Decreased activity tolerance, Decreased coordination, Decreased safety awareness, Decreased strength, Decreased scar mobility, Postural dysfunction, Pain, Improper body mechanics, Increased muscle spasms, Difficulty walking, Impaired sensation, Increased fascial restricitons, Decreased range of motion, Hypomobility  Visit Diagnosis: Pain in right hip  Other abnormalities of gait and mobility  Other disturbances of skin sensation  Difficulty in walking, not elsewhere classified  Muscle weakness (generalized)     Problem List Patient Active Problem List   Diagnosis Date Noted   Special screening for malignant neoplasms, colon    Polyp of ascending colon    Polyp of sigmoid colon    COVID-19 long hauler manifesting  chronic loss of smell and taste 03/23/2020   Pain in right hip 01/12/2019   Vitamin D deficiency 02/25/2018   Chronic pain 10/24/2015   Hot flashes 10/24/2015   Migraine without aura and without status migrainosus, not intractable 92/11/9415   Metabolic syndrome 40/81/4481   Dyslipidemia 10/24/2015   Allergic rhinitis, seasonal 05/17/2015   Asthma, mild intermittent 08/08/2014   Chronic insomnia 08/08/2014   DDD (degenerative disc disease), lumbar 08/08/2014   Mood disorder ([place]) 08/08/2014   Gastro-esophageal reflux disease without esophagitis 08/08/2014   H/O total hysterectomy 08/08/2014   Irritable bowel syndrome with diarrhea 08/08/2014   Osteoarthritis of right hip 08/08/2014   Adult BMI 30+ 08/08/2014   SBO (spina bifida occulta) 08/08/2014    Jerl Mina, PT 12/26/2020, 10:00 AM  [place], [place], [address] Phone: [phone]   Fax:  [phone]  Name: Shelby Henson MRN: [id number] Date of Birth: 10-21-1970

## 2021-01-02 ENCOUNTER — Encounter: Payer: No Typology Code available for payment source | Admitting: Physical Therapy

## 2021-01-03 ENCOUNTER — Encounter: Payer: No Typology Code available for payment source | Admitting: Physical Therapy

## 2021-01-09 ENCOUNTER — Other Ambulatory Visit: Payer: Self-pay

## 2021-01-09 ENCOUNTER — Encounter: Payer: No Typology Code available for payment source | Admitting: Physical Therapy

## 2021-01-10 ENCOUNTER — Encounter: Payer: No Typology Code available for payment source | Admitting: Physical Therapy

## 2021-01-16 ENCOUNTER — Encounter: Payer: No Typology Code available for payment source | Admitting: Physical Therapy

## 2021-01-16 ENCOUNTER — Other Ambulatory Visit: Payer: Self-pay

## 2021-01-16 ENCOUNTER — Other Ambulatory Visit: Payer: Self-pay | Admitting: Family Medicine

## 2021-01-16 DIAGNOSIS — F39 Unspecified mood [affective] disorder: Secondary | ICD-10-CM

## 2021-01-16 DIAGNOSIS — R232 Flushing: Secondary | ICD-10-CM

## 2021-01-16 MED ORDER — DESVENLAFAXINE SUCCINATE ER 50 MG PO TB24
ORAL_TABLET | Freq: Every day | ORAL | 1 refills | Status: DC
  Filled 2021-01-16: qty 90, 90d supply, fill #0
  Filled 2021-04-13: qty 90, 90d supply, fill #1

## 2021-01-16 MED ORDER — OZEMPIC (0.25 OR 0.5 MG/DOSE) 2 MG/1.5ML ~~LOC~~ SOPN
0.5000 mg | PEN_INJECTOR | SUBCUTANEOUS | 0 refills | Status: DC
  Filled 2021-01-16: qty 1.5, 28d supply, fill #0
  Filled 2021-03-07: qty 1.5, 28d supply, fill #1
  Filled 2021-04-13: qty 1.5, 28d supply, fill #2

## 2021-01-24 ENCOUNTER — Encounter: Payer: No Typology Code available for payment source | Admitting: Physical Therapy

## 2021-01-26 NOTE — Progress Notes (Signed)
Name: Shelby Henson   MRN: [id number]    DOB: May 31, 1970   Date:01/27/2021       Progress Note  Subjective  Chief Complaint  Follow Up  HPI  [place] COVID-19: diagnosed 11/27/2019 had infusion on 11/01/2021She continues to have lack of sense of taste and smell .  GERD: resolved with addition of Pantoprazole, no heartburn , indigestion or epigastric pain   Asthma mild persistent: she had an URI in Nov, severe wheezing, coughing but no SOB, she was on a cruise and used neb machine but is still not back to normal. She has a mild cough but greatly improved, some chest tightness that improves with neb therapy    Metabolic Syndrome/Morbid obesity: she is taking Ozempic, weight has been stable , no polyphagia, polydipsia or polyuria. She has BMI above 35 with co-morbidities , GERD, OA of right hip, metabolic syndrome    Chronic right hip pain: s/p replacement but still on modified duty at work and has daily pain, no longer seeing pain clinic, but sees Dr. Harlow Mares once a year. She would like to have a refill of meloxicam 15 mg daily . Pain is described as burning, dull and aching, constant, still requires her to shift positions frequently to control symptoms. Seeing neurologist now , had MRI and has mild arthritis lumbar spine, and Dr. Manuella Ghazi is ordering NCS and EMG. She is on higher dose of gabapentin now but did not notice a difference in pain level .    Insomnia: She was on Trazodone but we tried giving her Margarita Mail but she was not able to fill it. She would like to go back on Trazodone . Lunesta made her moody, she also tried Temazepam   Mood swings :she has been on Pritiq for many years, she has a family history of bipolar She denies side effects, no anxiety symptoms, phq was negative   Low B12: taking supplementation   Anemia unspecified: she has a history of iron deficiency anemia and has not been taking supplements, advised a MVI with iron .We will recheck labs today   Migraine headache:  she states symptoms usually intense , associated with nausea, photosensitivity, throbbing like and sometimes dull. Having more episodes since recent URI, almost daily for the past 6 weeks.    Patient Active Problem List   Diagnosis Date Noted   Special screening for malignant neoplasms, colon    Polyp of ascending colon    Polyp of sigmoid colon    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 03/23/2020   Pain in right hip 01/12/2019   Vitamin D deficiency 02/25/2018   Chronic pain 10/24/2015   Hot flashes 10/24/2015   Migraine without aura and without status migrainosus, not intractable 41/32/4401   Metabolic syndrome 02/72/5366   Dyslipidemia 10/24/2015   Allergic rhinitis, seasonal 05/17/2015   Asthma, mild intermittent 08/08/2014   Chronic insomnia 08/08/2014   DDD (degenerative disc disease), lumbar 08/08/2014   Mood disorder ([place]) 08/08/2014   Gastro-esophageal reflux disease without esophagitis 08/08/2014   H/O total hysterectomy 08/08/2014   Irritable bowel syndrome with diarrhea 08/08/2014   Osteoarthritis of right hip 08/08/2014   Adult BMI 30+ 08/08/2014   SBO (spina bifida occulta) 08/08/2014    Past Surgical History:  Procedure Laterality Date   ABDOMINAL HYSTERECTOMY     ABLATION     BREAST CYST ASPIRATION Left    CESAREAN SECTION     CESAREAN SECTION     CESAREAN SECTION  COLONOSCOPY N/A 09/22/2020   Procedure: COLONOSCOPY;  Surgeon: Virgel Manifold, MD;  Location: [facility] ENDOSCOPY;  Service: Endoscopy;  Laterality: N/A;   HIP SURGERY  10/2018   LAPAROSCOPY  2018   LEEP     TOTAL HIP ARTHROPLASTY Right 11/12/2018   Procedure: TOTAL HIP ARTHROPLASTY ANTERIOR APPROACH;  Surgeon: Lovell Sheehan, MD;  Location: [facility] ORS;  Service: Orthopedics;  Laterality: Right;   TOTAL VAGINAL HYSTERECTOMY      Family History  Problem Relation Age of Onset   Bipolar disorder Brother    Depression Mother    Breast cancer Neg Hx     Social  History   Tobacco Use   Smoking status: Never   Smokeless tobacco: Never  Substance Use Topics   Alcohol use: Yes    Alcohol/week: 0.0 standard drinks    Comment: occasionally     Current Outpatient Medications:    albuterol (PROVENTIL) (2.5 MG/3ML) 0.083% nebulizer solution, USE ONE VIAL VIA NEBULIZER EVERY 6 HOURS AS NEEDED FOR WHEEZING OR SHORTNESS OF BREATH., Disp: 75 mL, Rfl: 2   budesonide (PULMICORT) 0.5 MG/2ML nebulizer solution, TAKE 2 MLS BY NEBULIZATION 2 TIMES DAILY., Disp: 60 mL, Rfl: 5   cloNIDine (CATAPRES) 0.2 MG tablet, Take 1 tablet (0.2 mg total) by mouth daily., Disp: 90 tablet, Rfl: 3   desvenlafaxine (PRISTIQ) 50 MG 24 hr tablet, TAKE 1 TABLET BY MOUTH DAILY., Disp: 90 tablet, Rfl: 1   gabapentin (NEURONTIN) 300 MG capsule, Take 3 capsules (900 mg total) by mouth at bedtime, Disp: 180 capsule, Rfl: 3   meloxicam (MOBIC) 15 MG tablet, take one tablet by mouth once daily, Disp: 90 tablet, Rfl: 1   pantoprazole (PROTONIX) 40 MG tablet, Take 1 tablet (40 mg total) by mouth daily., Disp: 90 tablet, Rfl: 0   Semaglutide,0.25 or 0.5MG /DOS, (OZEMPIC, 0.25 OR 0.5 MG/DOSE,) 2 MG/1.5ML SOPN, Inject 0.5 mg into the skin once a week., Disp: 4.5 mL, Rfl: 0   SUMAtriptan (IMITREX) 100 MG tablet, TAKE 1 TABLET BY MOUTH EVERY 2 HOURS AS NEEDED FOR MIGRAINE. MAY REPEAT IN 2 HOURS IF HEADACHE PERSISTS OR RECURS., Disp: 10 tablet, Rfl: 0   Daridorexant HCl (QUVIVIQ) 50 MG TABS, Take 1 tablet by mouth every evening. (Patient not taking: Reported on 01/27/2021), Disp: 30 tablet, Rfl: 0   gabapentin (NEURONTIN) 300 MG capsule, Take 2 capsules (600 mg total) by mouth nightly for 90 days (Patient not taking: Reported on 01/27/2021), Disp: 180 capsule, Rfl: 3  Allergies  Allergen Reactions   Celecoxib Hives   Prednisone     intolerance, makes her angry. Makes her angry   Topamax [Topiramate]     Mood changes   Zolpidem    Codeine Rash   Penicillins Rash    Did it involve swelling of  the face/tongue/throat, SOB, or low BP? No Did it involve sudden or severe rash/hives, skin peeling, or any reaction on the inside of your mouth or nose? Yes Did you need to seek medical attention at a hospital or doctor's office? Yes When did it last happen? More than 20 years ago       If all above answers are NO, may proceed with cephalosporin use.     I personally reviewed active problem list, medication list, allergies, family history, social history, health maintenance with the patient/caregiver today.   ROS  Constitutional: Negative for fever or weight change.  Respiratory: Negative for cough and shortness of breath.   Cardiovascular: Negative for chest pain or  palpitations.  Gastrointestinal: Negative for abdominal pain, no bowel changes.  Musculoskeletal: Negative for gait problem or joint swelling.  Skin: Negative for rash.  Neurological: Negative for dizziness , positive for  headache.  No other specific complaints in a complete review of systems (except as listed in HPI above).   Objective  Vitals:   01/27/21 0802  BP: 132/70  Pulse: 81  Resp: 16  Temp: 98 F (36.7 C)  SpO2: 97%  Weight: 180 lb (81.6 kg)  Height: 4\' 11"  (1.499 m)    Body mass index is 36.36 kg/m.  Physical Exam  Constitutional: Patient appears well-developed and well-nourished. Obese  No distress.  HEENT: head atraumatic, normocephalic, pupils equal and reactive to light,  neck supple Cardiovascular: Normal rate, regular rhythm and normal heart sounds.  No murmur heard. No BLE edema. Pulmonary/Chest: Effort normal and breath sounds normal. No respiratory distress. Abdominal: Soft.  There is no tenderness. Psychiatric: Patient has a normal mood and affect. behavior is normal. Judgment and thought content normal.   PHQ2/9: Depression screen [facility] 2/9 01/27/2021 07/28/2020 03/23/2020 12/22/2019 12/08/2019  Decreased Interest 0 0 1 1 0  Down, Depressed, Hopeless 0 0 0 3 0  PHQ - 2 Score 0 0 1  4 0  Altered sleeping 0 3 3 3  -  Tired, decreased energy 0 1 1 0 -  Change in appetite 0 0 3 3 -  Feeling bad or failure about yourself  0 0 0 0 -  Trouble concentrating 0 2 1 1  -  Moving slowly or fidgety/restless 0 0 0 0 -  Suicidal thoughts 0 0 0 0 -  PHQ-9 Score 0 6 9 11  -  Difficult doing work/chores - Not difficult at all Somewhat difficult Not difficult at all -  Some recent data might be hidden    phq 9 is negative   Fall Risk: Fall Risk  01/27/2021 07/28/2020 03/23/2020 12/22/2019 12/08/2019  Falls in the past year? 0 1 0 0 0  Number falls in past yr: 0 0 0 0 0  Injury with Fall? 0 1 0 0 0  Comment - - - - -  Risk for fall due to : No Fall Risks - - - -  Follow up Falls prevention discussed - - - -      Functional Status Survey: Is the patient deaf or have difficulty hearing?: No Does the patient have difficulty seeing, even when wearing glasses/contacts?: No Does the patient have difficulty concentrating, remembering, or making decisions?: No Does the patient have difficulty walking or climbing stairs?: No Does the patient have difficulty dressing or bathing?: No Does the patient have difficulty doing errands alone such as visiting a doctor's office or shopping?: No    Assessment & Plan   1. Metabolic syndrome  - Hemoglobin A1c  2. Dyslipidemia  - Lipid panel  3. Primary insomnia  - traZODone (DESYREL) 100 MG tablet; Take 1 tablet (100 mg total) by mouth at bedtime.  Dispense: 90 tablet; Refill: 0  4. Migraine without aura and without status migrainosus, not intractable  - SUMAtriptan (IMITREX) 100 MG tablet; TAKE 1 TABLET BY MOUTH EVERY 2 HOURS AS NEEDED FOR MIGRAINE. MAY REPEAT IN 2 HOURS IF HEADACHE PERSISTS OR RECURS.  Dispense: 9 tablet; Refill: 0 - Ubrogepant (UBRELVY) 100 MG TABS; Take 1 tablet by mouth every other day.  Dispense: 16 tablet; Refill: 2  5. Mood disorder ([place])  Doing great on current medication regiment   6. Obesity (BMI  30-39.9)   7. Chronic right hip pain  Doing better   8. Morbid obesity ([place])  Discussed with the patient the risk posed by an increased BMI. Discussed importance of portion control, calorie counting and at least 150 minutes of physical activity weekly. Avoid sweet beverages and drink more water. Eat at least 6 servings of fruit and vegetables daily    9. Mild persistent asthma without complication  - budesonide (PULMICORT) 0.5 MG/2ML nebulizer solution; TAKE 2 MLS BY NEBULIZATION 2 TIMES DAILY.  Dispense: 60 mL; Refill: 5  10. History of iron deficiency anemia  - CBC with Differential/Platelet - Iron, TIBC and Ferritin Panel  11. 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   12. Gastroesophageal reflux disease without esophagitis  - pantoprazole (PROTONIX) 40 MG tablet; Take 1 tablet (40 mg total) by mouth daily.  Dispense: 90 tablet; Refill: 0  13. B12 deficiency  - Vitamin B12  14. Long-term use of high-risk medication  - COMPLETE METABOLIC PANEL WITH GFR

## 2021-01-27 ENCOUNTER — Other Ambulatory Visit: Payer: Self-pay

## 2021-01-27 ENCOUNTER — Encounter: Payer: Self-pay | Admitting: Family Medicine

## 2021-01-27 ENCOUNTER — Ambulatory Visit (INDEPENDENT_AMBULATORY_CARE_PROVIDER_SITE_OTHER): Payer: No Typology Code available for payment source | Admitting: Family Medicine

## 2021-01-27 VITALS — BP 132/70 | HR 81 | Temp 98.0°F | Resp 16 | Ht 59.0 in | Wt 180.0 lb

## 2021-01-27 DIAGNOSIS — F5101 Primary insomnia: Secondary | ICD-10-CM | POA: Diagnosis not present

## 2021-01-27 DIAGNOSIS — F39 Unspecified mood [affective] disorder: Secondary | ICD-10-CM

## 2021-01-27 DIAGNOSIS — E669 Obesity, unspecified: Secondary | ICD-10-CM

## 2021-01-27 DIAGNOSIS — Z79899 Other long term (current) drug therapy: Secondary | ICD-10-CM

## 2021-01-27 DIAGNOSIS — G8929 Other chronic pain: Secondary | ICD-10-CM

## 2021-01-27 DIAGNOSIS — M25551 Pain in right hip: Secondary | ICD-10-CM

## 2021-01-27 DIAGNOSIS — U099 Post covid-19 condition, unspecified: Secondary | ICD-10-CM

## 2021-01-27 DIAGNOSIS — J4530 Mild persistent asthma, uncomplicated: Secondary | ICD-10-CM

## 2021-01-27 DIAGNOSIS — E8881 Metabolic syndrome: Secondary | ICD-10-CM | POA: Diagnosis not present

## 2021-01-27 DIAGNOSIS — E785 Hyperlipidemia, unspecified: Secondary | ICD-10-CM

## 2021-01-27 DIAGNOSIS — E538 Deficiency of other specified B group vitamins: Secondary | ICD-10-CM

## 2021-01-27 DIAGNOSIS — K219 Gastro-esophageal reflux disease without esophagitis: Secondary | ICD-10-CM

## 2021-01-27 DIAGNOSIS — Z862 Personal history of diseases of the blood and blood-forming organs and certain disorders involving the immune mechanism: Secondary | ICD-10-CM

## 2021-01-27 DIAGNOSIS — R438 Other disturbances of smell and taste: Secondary | ICD-10-CM

## 2021-01-27 DIAGNOSIS — G43009 Migraine without aura, not intractable, without status migrainosus: Secondary | ICD-10-CM

## 2021-01-27 MED ORDER — TRAZODONE HCL 100 MG PO TABS
100.0000 mg | ORAL_TABLET | Freq: Every day | ORAL | 0 refills | Status: DC
  Filled 2021-01-27: qty 90, 90d supply, fill #0

## 2021-01-27 MED ORDER — SUMATRIPTAN SUCCINATE 100 MG PO TABS
ORAL_TABLET | ORAL | 0 refills | Status: DC
  Filled 2021-01-27: qty 9, 30d supply, fill #0

## 2021-01-27 MED ORDER — BUDESONIDE 0.5 MG/2ML IN SUSP
RESPIRATORY_TRACT | 5 refills | Status: DC
  Filled 2021-01-27: qty 60, 15d supply, fill #0
  Filled 2021-10-10: qty 60, 15d supply, fill #1

## 2021-01-27 MED ORDER — PANTOPRAZOLE SODIUM 40 MG PO TBEC
40.0000 mg | DELAYED_RELEASE_TABLET | Freq: Every day | ORAL | 0 refills | Status: DC
  Filled 2021-01-27: qty 90, 90d supply, fill #0

## 2021-01-27 MED ORDER — UBRELVY 100 MG PO TABS
1.0000 | ORAL_TABLET | ORAL | 2 refills | Status: DC
  Filled 2021-01-27: qty 15, 30d supply, fill #0
  Filled 2021-02-07: qty 16, 32d supply, fill #0
  Filled 2021-03-29: qty 16, 32d supply, fill #1
  Filled 2021-05-04: qty 16, 32d supply, fill #2

## 2021-01-31 ENCOUNTER — Other Ambulatory Visit: Payer: Self-pay

## 2021-02-03 ENCOUNTER — Other Ambulatory Visit: Payer: Self-pay

## 2021-02-03 ENCOUNTER — Other Ambulatory Visit: Payer: Self-pay | Admitting: Family Medicine

## 2021-02-03 DIAGNOSIS — M25551 Pain in right hip: Secondary | ICD-10-CM

## 2021-02-03 DIAGNOSIS — G8929 Other chronic pain: Secondary | ICD-10-CM

## 2021-02-03 MED ORDER — MELOXICAM 15 MG PO TABS
ORAL_TABLET | ORAL | 1 refills | Status: DC
  Filled 2021-02-03: qty 90, 90d supply, fill #0

## 2021-02-07 ENCOUNTER — Other Ambulatory Visit: Payer: Self-pay

## 2021-02-19 DIAGNOSIS — Z96641 Presence of right artificial hip joint: Secondary | ICD-10-CM | POA: Insufficient documentation

## 2021-02-19 DIAGNOSIS — K635 Polyp of colon: Secondary | ICD-10-CM | POA: Insufficient documentation

## 2021-02-19 DIAGNOSIS — G5711 Meralgia paresthetica, right lower limb: Secondary | ICD-10-CM | POA: Insufficient documentation

## 2021-03-07 ENCOUNTER — Other Ambulatory Visit: Payer: Self-pay

## 2021-03-15 ENCOUNTER — Other Ambulatory Visit: Payer: Self-pay

## 2021-03-15 ENCOUNTER — Encounter: Payer: Self-pay | Admitting: Student in an Organized Health Care Education/Training Program

## 2021-03-15 ENCOUNTER — Ambulatory Visit
Payer: No Typology Code available for payment source | Attending: Student in an Organized Health Care Education/Training Program | Admitting: Student in an Organized Health Care Education/Training Program

## 2021-03-15 VITALS — BP 119/88 | HR 73 | Temp 97.6°F | Resp 15 | Ht 59.0 in | Wt 177.0 lb

## 2021-03-15 DIAGNOSIS — M25551 Pain in right hip: Secondary | ICD-10-CM | POA: Insufficient documentation

## 2021-03-15 DIAGNOSIS — Z96641 Presence of right artificial hip joint: Secondary | ICD-10-CM | POA: Diagnosis present

## 2021-03-15 DIAGNOSIS — M533 Sacrococcygeal disorders, not elsewhere classified: Secondary | ICD-10-CM | POA: Insufficient documentation

## 2021-03-15 DIAGNOSIS — G5711 Meralgia paresthetica, right lower limb: Secondary | ICD-10-CM | POA: Diagnosis present

## 2021-03-15 DIAGNOSIS — G8929 Other chronic pain: Secondary | ICD-10-CM | POA: Diagnosis present

## 2021-03-15 NOTE — Progress Notes (Signed)
Safety precautions to be maintained throughout the outpatient stay will include: orient to surroundings, keep bed in low position, maintain call bell within reach at all times, provide assistance with transfer out of bed and ambulation.  
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
## 2021-03-16 ENCOUNTER — Ambulatory Visit
Admission: RE | Admit: 2021-03-16 | Discharge: 2021-03-16 | Disposition: A | Discharge status: Home / Self Care | Payer: No Typology Code available for payment source | Source: Ambulatory Visit | Attending: Student in an Organized Health Care Education/Training Program | Admitting: Student in an Organized Health Care Education/Training Program

## 2021-03-16 ENCOUNTER — Ambulatory Visit
Admission: RE | Admit: 2021-03-16 | Discharge: 2021-03-16 | Disposition: A | Discharge status: Home / Self Care | Payer: No Typology Code available for payment source | Attending: Student in an Organized Health Care Education/Training Program | Admitting: Student in an Organized Health Care Education/Training Program

## 2021-03-16 DIAGNOSIS — G8929 Other chronic pain: Secondary | ICD-10-CM

## 2021-03-16 DIAGNOSIS — M25551 Pain in right hip: Secondary | ICD-10-CM | POA: Diagnosis present

## 2021-03-16 DIAGNOSIS — M533 Sacrococcygeal disorders, not elsewhere classified: Secondary | ICD-10-CM | POA: Diagnosis present

## 2021-03-16 DIAGNOSIS — G5711 Meralgia paresthetica, right lower limb: Secondary | ICD-10-CM

## 2021-03-16 DIAGNOSIS — Z96641 Presence of right artificial hip joint: Secondary | ICD-10-CM | POA: Diagnosis present

## 2021-03-21 ENCOUNTER — Encounter: Payer: Self-pay | Admitting: Student in an Organized Health Care Education/Training Program

## 2021-03-21 ENCOUNTER — Encounter: Payer: Self-pay | Admitting: *Deleted

## 2021-03-22 ENCOUNTER — Other Ambulatory Visit: Payer: Self-pay | Admitting: Student in an Organized Health Care Education/Training Program

## 2021-03-22 DIAGNOSIS — G5711 Meralgia paresthetica, right lower limb: Secondary | ICD-10-CM

## 2021-03-22 NOTE — Progress Notes (Unsigned)
CLINICAL DATA:  Sacroiliac joint pain.   EXAM: BILATERAL SACROILIAC JOINTS - 3+ VIEW   COMPARISON:  MRI lumbar spine 07/14/2020. Bone scan/CT 04/13/2019. Right hip series 11/12/2018.   FINDINGS: Moderate degenerative changes lumbar spine, both SI joints, left hip. Total right hip replacement. No evidence of erosive arthropathy. Visualized hardware intact. Anatomic alignment. Abdominal and pelvic calcifications consistent with phleboliths again noted.   IMPRESSION: 1. Moderate degenerative changes lumbar spine, both SI joints, left hip. No evidence of erosive arthropathy.   2. Total right hip replacement. Visualized hardware intact. Anatomic alignment. No acute bony or joint abnormality identified.     Electronically Signed   By: Marcello Moores  Register M.D.   On: 03/22/2021 09:25    I spoke with patient to review her SI joint x-ray.  There is moderate SI joint degeneration which could be a pain generator for her.  She states that the majority of her pain is along the lateral aspect of her leg and along her waist and sometimes into her groin.  I am going to refer her to Dr. Zigmund Daniel with sports medicine to consider a ultrasound-guided lateral femoral cutaneous nerve block for meralgia parasthetica.  If this is not effective, then can consider right diagnostic SI joint injection.  Orders Placed This Encounter  Procedures   Ambulatory referral to Family Practice    Referral Priority:   Routine    Referral Type:   Consultation    Referral Reason:   Specialty Services Required    Referred to Provider:   Montel Culver, MD    Requested Specialty:   Family Medicine    Number of Visits Requested:   1

## 2021-03-23 ENCOUNTER — Encounter: Payer: Self-pay | Admitting: Student in an Organized Health Care Education/Training Program

## 2021-03-23 NOTE — Progress Notes (Signed)
LVM TO SET UP ORTHO EVAL

## 2021-03-23 NOTE — Progress Notes (Signed)
Patient scheduled 03/29/21.

## 2021-03-29 ENCOUNTER — Other Ambulatory Visit: Payer: Self-pay

## 2021-03-29 ENCOUNTER — Ambulatory Visit (INDEPENDENT_AMBULATORY_CARE_PROVIDER_SITE_OTHER): Payer: No Typology Code available for payment source | Admitting: Family Medicine

## 2021-03-29 ENCOUNTER — Encounter: Payer: Self-pay | Admitting: Family Medicine

## 2021-03-29 VITALS — BP 122/84 | HR 72 | Ht 59.0 in | Wt 176.0 lb

## 2021-03-29 DIAGNOSIS — M7611 Psoas tendinitis, right hip: Secondary | ICD-10-CM

## 2021-03-29 DIAGNOSIS — G5711 Meralgia paresthetica, right lower limb: Secondary | ICD-10-CM | POA: Diagnosis not present

## 2021-03-29 DIAGNOSIS — Z96641 Presence of right artificial hip joint: Secondary | ICD-10-CM

## 2021-03-29 MED ORDER — DICLOFENAC SODIUM 75 MG PO TBEC
75.0000 mg | DELAYED_RELEASE_TABLET | Freq: Two times a day (BID) | ORAL | 0 refills | Status: DC
  Filled 2021-03-29: qty 30, 15d supply, fill #0

## 2021-03-29 NOTE — Assessment & Plan Note (Signed)
Chronic right hip pain, ongoing for 2 years in the setting of total hip arthroplasty roughly 2.5 years prior. See additional assessment(s) for plan details. ?

## 2021-03-29 NOTE — Assessment & Plan Note (Addendum)
Shelby Henson presents with roughly 2-year history of atraumatic right anterolateral hip pain in the setting of total hip arthroplasty by Dr. Harlow Mares at [facility] on 11/12/2018.  Over this interval she has had evaluation inclusive of second opinion by Dr. Marry Guan, orthopedic surgeon at [facility] clinic, Dr. Manuella Ghazi, neurologist at [facility] clinic, and most recently by Dr. Holley Raring, interventional pain and spine with [facility] with conclusions involving no surgical hardware issues and concern over lateral femoral cutaneous nerve involvement. ? ?She describes 2 separate regions of right hip pain, one anterolateral described as aching, burning, numbness, and tingling.  This is noted intermittently throughout the day with all activities inclusive of being seated, weightbearing, and only mitigated by ice applied to the anterolateral thigh.  She denies any radiation of the symptoms below the knee.  Her other area of pain involves the right groin without radiation, described primarily as a deep-seated ache, aggravated primarily with weightbearing.  She does note these separate areas of pain to occur independently but at times there is overlap in timing. ? ?From a physical examination standpoint she does have pain, paresthesias, and near recreation of symptomatology with direct palpation and Tinel testing over the lateral part of the right inguinal ligament, inferomedial to the ASIS.  During FABER and FADIR testing she does have a painful appreciable click and shifting sensation that is not appreciated on the contralateral side.  She does have minor tenderness at the left inferomedial ASIS without radiation of symptoms.  Isolated and resisted hip flexion does recreate her right groin symptoms, remainder and contralateral examination benign. ? ?Her stated symptomatology is most consistent with 2 primary etiologies, lateral femoral cutaneous nerve entrapment as well as the psoas tendon as it crosses the right hip joint.  I have  reviewed the same with the patient as well as further evaluation and management strategies.  She has elected to proceed with diagnostic and therapeutic ultrasound-guided lidocaine-corticosteroid injections at the lateral femoral cutaneous nerve, separately at the extra-articular region of the right hip surrounding the psoas tendon.  We will schedule these procedures today.  Over the interim I have advised a trial transition of meloxicam to diclofenac twice daily for postprocedural planning.  Questions were answered at length, diagrams reviewed, and specifics of planned procedure including risks/benefits discussed. ? ?Chronic condition, symptomatic, Rx management. ?

## 2021-03-29 NOTE — Progress Notes (Signed)
?  ? ?Primary Care / Sports Medicine Office Visit ? ?Patient Information:  ?Patient ID: MILY MALECKI, female DOB: 1970/06/09 Age: 51 y.o. MRN: [id number]  ? ?Shelby Henson is a pleasant 51 y.o. female presenting with the following: ? ?Chief Complaint  ?Patient presents with  ? Hip Pain  ?  Rt hip. Started 2 years ago. Total hip replacement 2.5 years ago. Has bothered her for 8 years, even after replacement. No falls.   ? ? ?Vitals:  ? 03/29/21 0859  ?BP: 122/84  ?Pulse: 72  ?SpO2: 95%  ? ?Vitals:  ? 03/29/21 0859  ?Weight: 176 lb (79.8 kg)  ?Height: 4\' 11"  (1.499 m)  ? ?Body mass index is 35.55 kg/m?. ? ?DG Si Joints ? ?Result Date: 03/22/2021 ?CLINICAL DATA:  Sacroiliac joint pain. EXAM: BILATERAL SACROILIAC JOINTS - 3+ VIEW COMPARISON:  MRI lumbar spine 07/14/2020. Bone scan/CT 04/13/2019. Right hip series 11/12/2018. FINDINGS: Moderate degenerative changes lumbar spine, both SI joints, left hip. Total right hip replacement. No evidence of erosive arthropathy. Visualized hardware intact. Anatomic alignment. Abdominal and pelvic calcifications consistent with phleboliths again noted. IMPRESSION: 1. Moderate degenerative changes lumbar spine, both SI joints, left hip. No evidence of erosive arthropathy. 2. Total right hip replacement. Visualized hardware intact. Anatomic alignment. No acute bony or joint abnormality identified. Electronically Signed   By: Marcello Moores  Register M.D.   On: 03/22/2021 09:25    ? ?Independent interpretation of notes and tests performed by another provider:  ? ?None ? ?Procedures performed:  ? ?None ? ?Pertinent History, Exam, Impression, and Recommendations:  ? ?Meralgia paresthetica of right side ?Ms. Hoppe presents with roughly 2-year history of atraumatic right anterolateral hip pain in the setting of total hip arthroplasty by Dr. Harlow Mares at [facility] on 11/12/2018.  Over this interval she has had evaluation inclusive of second opinion by Dr. Marry Guan, orthopedic surgeon at [facility] clinic,  Dr. Manuella Ghazi, neurologist at [facility] clinic, and most recently by Dr. Holley Raring, interventional pain and spine with [facility] with conclusions involving no surgical hardware issues and concern over lateral femoral cutaneous nerve involvement. ? ?She describes 2 separate regions of right hip pain, one anterolateral described as aching, burning, numbness, and tingling.  This is noted intermittently throughout the day with all activities inclusive of being seated, weightbearing, and only mitigated by ice applied to the anterolateral thigh.  She denies any radiation of the symptoms below the knee.  Her other area of pain involves the right groin without radiation, described primarily as a deep-seated ache, aggravated primarily with weightbearing.  She does note these separate areas of pain to occur independently but at times there is overlap in timing. ? ?From a physical examination standpoint she does have pain, paresthesias, and near recreation of symptomatology with direct palpation and Tinel testing over the lateral part of the right inguinal ligament, inferomedial to the ASIS.  During FABER and FADIR testing she does have a painful appreciable click and shifting sensation that is not appreciated on the contralateral side.  She does have minor tenderness at the left inferomedial ASIS without radiation of symptoms.  Isolated and resisted hip flexion does recreate her right groin symptoms, remainder and contralateral examination benign. ? ?Her stated symptomatology is most consistent with 2 primary etiologies, lateral femoral cutaneous nerve entrapment as well as the psoas tendon as it crosses the right hip joint.  I have reviewed the same with the patient as well as further evaluation and management strategies.  She has elected to proceed  with diagnostic and therapeutic ultrasound-guided lidocaine-corticosteroid injections at the lateral femoral cutaneous nerve, separately at the extra-articular region of the right hip  surrounding the psoas tendon.  We will schedule these procedures today.  Over the interim I have advised a trial transition of meloxicam to diclofenac twice daily for postprocedural planning.  Questions were answered at length, diagrams reviewed, and specifics of planned procedure including risks/benefits discussed. ? ?Chronic condition, symptomatic, Rx management. ? ?Psoas tendinitis, right hip ?Chronic right hip pain, ongoing for 2 years in the setting of total hip arthroplasty roughly 2.5 years prior. See additional assessment(s) for plan details.  ? ?Orders & Medications ?Meds ordered this encounter  ?Medications  ? diclofenac (VOLTAREN) 75 MG EC tablet  ?  Sig: Take 1 tablet (75 mg total) by mouth 2 (two) times daily.  ?  Dispense:  30 tablet  ?  Refill:  0  ? ?No orders of the defined types were placed in this encounter. ?  ? ?Return in about 1 week (around 04/05/2021) for right hip.  ?  ? ?Montel Culver, MD ? ? Primary Care Sports Medicine ?[facility] ?[place]  ? ?

## 2021-03-29 NOTE — Patient Instructions (Signed)
-   Hold from meloxicam ?- Start diclofenac twice daily with food (no other NSAIDs while this medication) ?- Front desk will schedule you for return visit with planned procedures for nerve and hip tendon injections (lateral femoral cutaneous nerve block and psoas tendon) ?- Contact us for any questions between now and follow-up ?

## 2021-04-13 ENCOUNTER — Other Ambulatory Visit: Payer: Self-pay

## 2021-04-13 ENCOUNTER — Encounter: Payer: Self-pay | Admitting: Family Medicine

## 2021-04-13 ENCOUNTER — Ambulatory Visit (INDEPENDENT_AMBULATORY_CARE_PROVIDER_SITE_OTHER): Payer: No Typology Code available for payment source | Admitting: Family Medicine

## 2021-04-13 ENCOUNTER — Inpatient Hospital Stay (INDEPENDENT_AMBULATORY_CARE_PROVIDER_SITE_OTHER): Payer: No Typology Code available for payment source | Admitting: Radiology

## 2021-04-13 VITALS — BP 126/82 | HR 82 | Ht 59.0 in | Wt 177.2 lb

## 2021-04-13 DIAGNOSIS — M7611 Psoas tendinitis, right hip: Secondary | ICD-10-CM | POA: Diagnosis not present

## 2021-04-13 DIAGNOSIS — G5711 Meralgia paresthetica, right lower limb: Secondary | ICD-10-CM

## 2021-04-13 MED ORDER — TRIAMCINOLONE ACETONIDE 40 MG/ML IJ SUSP
40.0000 mg | Freq: Once | INTRAMUSCULAR | Status: AC
  Administered 2021-04-13: 40 mg via INTRAMUSCULAR

## 2021-04-13 NOTE — Assessment & Plan Note (Signed)
Patient presents for follow-up to right chronic anterior hip pain in the setting of total hip replacement and previously noted concern for psoas tendinopathy.  At her last visit on 03/29/2021 she was advised scheduled diclofenac as a transition from meloxicam, relative rest/activity shutdown, and close follow-up.  Unfortunate, she continues to endorse similar symptoms without significant improvement.  As such, she did elect to proceed with ultrasound-guided diagnostic/therapeutic psoas tendon peritendinous injection.  Post care was reviewed, advised supportive care, transition to as needed diclofenac, and activity shutdown x2 days with gradual ramp up thereafter.  She is to contact us in 2 weeks to provide a status update and we can determine next steps at that time. ? ?Chronic condition, symptomatic, Rx management ?

## 2021-04-13 NOTE — Progress Notes (Signed)
?  ? ?Primary Care / Sports Medicine Office Visit ? ?Patient Information:  ?Patient ID: SHANEQUIA KENDRICK, female DOB: 11-Jan-1971 Age: 51 y.o. MRN: [id number]  ? ?CALLEN ZUBA is a pleasant 51 y.o. female presenting with the following: ? ?Chief Complaint  ?Patient presents with  ? Hip Pain  ?  Right hip injections, right hip replacement 2.5years ago.   ? ? ?Vitals:  ? 04/13/21 1511  ?BP: 126/82  ?Pulse: 82  ?SpO2: 96%  ? ?Vitals:  ? 04/13/21 1511  ?Weight: 177 lb 3.2 oz (80.4 kg)  ?Height: '4\' 11"'$  (1.499 m)  ? ?Body mass index is 35.79 kg/m?. ?  ? ?Independent interpretation of notes and tests performed by another provider:  ? ?None ? ?Procedures performed:  ? ?Procedure:  Perineural injection of right lateral femoral cutaneous nerve under ultrasound guidance. ?Ultrasound guidance utilized for in-plane approach to right lateral femoral cutaneous nerve, no hypoechoic region surrounding nerve noted ?Samsung HS60 device utilized with permanent recording / reporting. ?Verbal informed consent obtained and verified. ?Skin prepped in a sterile fashion. ?Ethyl chloride for topical local analgesia.  ?Completed without difficulty and tolerated well. ?Medication: triamcinolone acetonide 40 mg/mL suspension for injection 1 mL total and 2 mL lidocaine 1% without epinephrine utilized for needle placement anesthetic ?Advised to contact for fevers/chills, erythema, induration, drainage, or persistent bleeding. ? ?Procedure:  Peritendinous injection of right psoas tendon under ultrasound guidance. ?Ultrasound guidance utilized for in-plane approach to right psoas tendon, anisotropy from surgical hardware noted ?Samsung HS60 device utilized with permanent recording / reporting. ?Verbal informed consent obtained and verified. ?Skin prepped in a sterile fashion. ?Ethyl chloride for topical local analgesia.  ?Completed without difficulty and tolerated well. ?Medication: triamcinolone acetonide 40 mg/mL suspension for injection 1 mL total and  2 mL lidocaine 1% without epinephrine utilized for needle placement anesthetic ?Advised to contact for fevers/chills, erythema, induration, drainage, or persistent bleeding. ? ? ?Pertinent History, Exam, Impression, and Recommendations:  ? ?Meralgia paresthetica of right side ?Patient presents for follow-up to right anterior hip pain in the setting of total hip arthroplasty from 11/12/2018 with concern for meralgia paresthetica being the underlying etiology.  She has undergone relative rest, shutdown from the gym, transition to scheduled diclofenac, and has had persistent symptomatology.  Given her clinical course, we discussed additional treatment strategies and she did elect to proceed with ultrasound-guided lateral femoral cutaneous nerve block/cortisone diagnostic/therapeutic injection.  Post care reviewed, timeline for symptom response relayed, and she is to contact her office in 2 weeks to provide a status update where we can determine next appropriate steps.  From a medication management standpoint, I have advised to transition to as needed diclofenac until symptoms respond to injection. ? ?Chronic condition, symptomatic, Rx management ? ?Psoas tendinitis, right hip ?Patient presents for follow-up to right chronic anterior hip pain in the setting of total hip replacement and previously noted concern for psoas tendinopathy.  At her last visit on 03/29/2021 she was advised scheduled diclofenac as a transition from meloxicam, relative rest/activity shutdown, and close follow-up.  Unfortunate, she continues to endorse similar symptoms without significant improvement.  As such, she did elect to proceed with ultrasound-guided diagnostic/therapeutic psoas tendon peritendinous injection.  Post care was reviewed, advised supportive care, transition to as needed diclofenac, and activity shutdown x2 days with gradual ramp up thereafter.  She is to contact us in 2 weeks to provide a status update and we can determine next  steps at that time. ? ?Chronic condition, symptomatic, Rx  management  ? ?Orders & Medications ?Meds ordered this encounter  ?Medications  ? triamcinolone acetonide (KENALOG-40) injection 40 mg  ? ?Orders Placed This Encounter  ?Procedures  ? [place] LIMITED JOINT SPACE STRUCTURES LOW RIGHT  ?  ? ?Return if symptoms worsen or fail to improve.  ?  ? ?Montel Culver, MD ? ? Primary Care Sports Medicine ?[facility] ?[place]  ? ?

## 2021-04-13 NOTE — Assessment & Plan Note (Signed)
Patient presents for follow-up to right anterior hip pain in the setting of total hip arthroplasty from 11/12/2018 with concern for meralgia paresthetica being the underlying etiology.  She has undergone relative rest, shutdown from the gym, transition to scheduled diclofenac, and has had persistent symptomatology.  Given her clinical course, we discussed additional treatment strategies and she did elect to proceed with ultrasound-guided lateral femoral cutaneous nerve block/cortisone diagnostic/therapeutic injection.  Post care reviewed, timeline for symptom response relayed, and she is to contact her office in 2 weeks to provide a status update where we can determine next appropriate steps.  From a medication management standpoint, I have advised to transition to as needed diclofenac until symptoms respond to injection. ? ?Chronic condition, symptomatic, Rx management ?

## 2021-04-13 NOTE — Patient Instructions (Signed)
You have just been given a cortisone injection to reduce pain and inflammation. After the injection you may notice immediate relief of pain as a result of the Lidocaine. It is important to rest the area of the injection for 24 to 48 hours after the injection. There is a possibility of some temporary increased discomfort and swelling for up to 72 hours until the cortisone begins to work. If you do have pain, simply rest the joint and use ice. If you can tolerate over the counter medications, you can try Tylenol, Aleve, or Advil for added relief per package instructions. ?- As above, relative rest x2 days and then gradual return to normal activity ?- Transition to as needed dosing of diclofenac until symptoms respond to cortisone injections ?- Contact our office to provide a status update in 2 weeks or for any questions between now and then ?

## 2021-04-15 ENCOUNTER — Encounter: Payer: Self-pay | Admitting: Family Medicine

## 2021-04-17 NOTE — Telephone Encounter (Signed)
Please review.  KP

## 2021-04-20 ENCOUNTER — Encounter: Payer: Self-pay | Admitting: Family Medicine

## 2021-04-20 NOTE — Telephone Encounter (Signed)
Please review.  KP

## 2021-04-20 NOTE — Telephone Encounter (Signed)
Please advise 

## 2021-04-24 ENCOUNTER — Encounter: Payer: Self-pay | Admitting: Family Medicine

## 2021-04-24 NOTE — Telephone Encounter (Signed)
Please review.  KP

## 2021-04-25 ENCOUNTER — Other Ambulatory Visit: Payer: Self-pay

## 2021-05-01 ENCOUNTER — Telehealth: Payer: Self-pay

## 2021-05-01 NOTE — Telephone Encounter (Signed)
LM for patient to call office for pre virtual appointment questions.  

## 2021-05-02 ENCOUNTER — Telehealth: Payer: Self-pay

## 2021-05-02 NOTE — Telephone Encounter (Signed)
Patient phone goes to VM on first ring, instructed to call us so we can review meds, etc before appt. ?

## 2021-05-03 ENCOUNTER — Ambulatory Visit
Payer: No Typology Code available for payment source | Attending: Student in an Organized Health Care Education/Training Program | Admitting: Student in an Organized Health Care Education/Training Program

## 2021-05-03 ENCOUNTER — Encounter: Payer: Self-pay | Admitting: Student in an Organized Health Care Education/Training Program

## 2021-05-03 ENCOUNTER — Other Ambulatory Visit: Payer: Self-pay | Admitting: Family Medicine

## 2021-05-03 DIAGNOSIS — M25551 Pain in right hip: Secondary | ICD-10-CM | POA: Diagnosis not present

## 2021-05-03 DIAGNOSIS — Z96641 Presence of right artificial hip joint: Secondary | ICD-10-CM | POA: Diagnosis not present

## 2021-05-03 DIAGNOSIS — G8929 Other chronic pain: Secondary | ICD-10-CM

## 2021-05-03 DIAGNOSIS — G5711 Meralgia paresthetica, right lower limb: Secondary | ICD-10-CM

## 2021-05-03 DIAGNOSIS — Z1231 Encounter for screening mammogram for malignant neoplasm of breast: Secondary | ICD-10-CM

## 2021-05-03 NOTE — Progress Notes (Signed)
Patient: Shelby Henson  Service Category: E/M  Provider: Gillis Santa, MD  ?DOB: December 13, 1970  DOS: 05/03/2021  Location: Office  ?MRN: [id number]  Setting: Ambulatory outpatient  Referring Provider: Steele Sizer, MD  ?Type: Established Patient  Specialty: Interventional Pain Management  PCP: Steele Sizer, MD  ?Location: Remote location  Delivery: TeleHealth    ? ?Virtual Encounter - Pain Management ?PROVIDER NOTE: Information contained herein reflects review and annotations entered in association with encounter. Interpretation of such information and data should be left to medically-trained personnel. Information provided to patient can be located elsewhere in the medical record under "Patient Instructions". Document created using STT-dictation technology, any transcriptional errors that may result from process are unintentional.  ?  ?Contact & Pharmacy ?Preferred: [phone] ?Home: [phone] (home) ?Mobile: [phone] (mobile) ?E-mail: [email or web address]  ?[facility] ?[address] ?[place] [address] ?Phone: [phone] Fax: [phone] ?  ?Pre-screening  ?Shelby Henson offered "in-person" vs "virtual" encounter. She indicated preferring virtual for this encounter.  ? ?Reason ?COVID-19*  Social distancing based on CDC and AMA recommendations.  ? ?I contacted Shelby Henson on 05/03/2021 via telephone.      I clearly identified myself as Gillis Santa, MD. I verified that I was speaking with the correct person using two identifiers (Name: Shelby Henson, and date of birth: May 01, 1970). ? ?Consent ?I sought verbal advanced consent from Shelby Henson for virtual visit interactions. I informed Shelby Henson of possible security and privacy concerns, risks, and limitations associated with providing "not-in-person" medical evaluation and management services. I also informed Shelby Henson of the availability of "in-person" appointments. Finally, I informed her that there would be a charge  for the virtual visit and that she could be  personally, fully or partially, financially responsible for it. Shelby Henson expressed understanding and agreed to proceed.  ? ?Historic Elements   ?Shelby Henson is a 51 y.o. year old, female patient evaluated today after our last contact on 03/15/2021. Shelby Henson  has a past medical history of Anemia, Asthma, Complication of anesthesia, Depression, GERD (gastroesophageal reflux disease), Migraine, Morbid obesity ([place]), Osteoarthritis, and PONV (postoperative nausea and vomiting). She also  has a past surgical history that includes Abdominal hysterectomy; Cesarean section; Cesarean section; Ablation; LEEP; Total vaginal hysterectomy; laparoscopy (2018); Breast cyst aspiration (Left); Cesarean section; Total hip arthroplasty (Right, 11/12/2018); Hip surgery (10/2018); and Colonoscopy (N/A, 09/22/2020). Shelby Henson has a current medication list which includes the following prescription(s): albuterol, budesonide, clonidine, desvenlafaxine, gabapentin, pantoprazole, ozempic (0.25 or 0.5 mg/dose), sumatriptan, trazodone, ubrelvy, and diclofenac. She  reports that she has never smoked. She has never used smokeless tobacco. She reports current alcohol use. She reports that she does not use drugs. Shelby Henson is allergic to celecoxib, prednisone, topamax [topiramate], zolpidem, codeine, and penicillins.  ? ?HPI  ?Today, she is being contacted for follow-up evaluation ? ?Shelby Henson is status post right lateral femoral cutaneous nerve block under ultrasound guidance for right meralgia paresthetica with Dr. Rosette Reveal.  She had about 2 days of pain relief.  She continues to have persistent pain in that right lateral hip and thigh.  Of note she does have a total hip arthroplasty on 11/12/2018.  Given duration of pain relief with nerve block, we have discussed  radiofrequency ablation of the lateral femoral cutaneous nerve.  Risks and benefits were reviewed with patient in great detail.   We will plan on doing this under ultrasound guidance.  I have offered the patient to  do this with oral Valium for comfort. ? ?Laboratory Chemistry Profile  ? ?Renal ?Lab Results  ?Component Value Date  ? BUN 17 12/08/2019  ? CREATININE 0.60 04/28/2020  ? BCR NOT APPLICABLE 17/91/5056  ? GFRAA 122 09/08/2019  ? GFRNONAA >60 12/08/2019  ?  Hepatic ?Lab Results  ?Component Value Date  ? AST 17 09/08/2019  ? ALT 24 09/08/2019  ? ALBUMIN 4.1 09/17/2018  ? ALKPHOS 94 09/17/2018  ? HCVAB 0.1 09/17/2018  ?  ?Electrolytes ?Lab Results  ?Component Value Date  ? NA 138 12/08/2019  ? K 3.4 (L) 12/08/2019  ? CL 105 12/08/2019  ? CALCIUM 9.3 12/08/2019  ?  Bone ?Lab Results  ?Component Value Date  ? VD25OH 21 (L) 09/08/2019  ?  ?Inflammation (CRP: Acute Phase) (ESR: Chronic Phase) ?No results found for: CRP, ESRSEDRATE, LATICACIDVEN    ?  ? ?Note: Above Lab results reviewed. ? ?Imaging  ?[place] LIMITED JOINT SPACE STRUCTURES LOW RIGHT ?Procedure:  Perineural injection of right lateral femoral cutaneous nerve  ?under ultrasound guidance. ?Ultrasound guidance utilized for in-plane approach to right lateral  ?femoral cutaneous nerve, no hypoechoic region surrounding nerve noted ?Samsung HS60 device utilized with permanent recording / reporting. ?Verbal informed consent obtained and verified. ?Skin prepped in a sterile fashion. ?Ethyl chloride for topical local analgesia.  ?Completed without difficulty and tolerated well. ?Medication: triamcinolone acetonide 40 mg/mL suspension for injection 1 mL  ?total and 2 mL lidocaine 1% without epinephrine utilized for needle  ?placement anesthetic ?Advised to contact for fevers/chills, erythema, induration, drainage, or  ?persistent bleeding. ? ?Procedure:  Peritendinous injection of right psoas tendon under ultrasound  ?guidance. ?Ultrasound guidance utilized for in-plane approach to right psoas tendon,  ?anisotropy from surgical hardware noted ?Samsung HS60 device utilized with permanent  recording / reporting. ?Verbal informed consent obtained and verified. ?Skin prepped in a sterile fashion. ?Ethyl chloride for topical local analgesia.  ?Completed without difficulty and tolerated well. ?Medication: triamcinolone acetonide 40 mg/mL suspension for injection 1 mL  ?total and 2 mL lidocaine 1% without epinephrine utilized for needle  ?placement anesthetic ?Advised to contact for fevers/chills, erythema, induration, drainage, or  ?persistent bleeding. ? ?Assessment  ?The primary encounter diagnosis was Meralgia paraesthetica, right. Diagnoses of Chronic hip pain after total replacement of right hip joint and Chronic right hip pain were also pertinent to this visit. ? ?Plan of Care  ? ?Patient is status post right lateral femoral continuous nerve block for meralgia paresthetica which provided her with short-term pain relief.  Given return of pain, patient is inquiring about additional therapies that could provide longer lasting pain relief.  I discussed radiofrequency ablation of the right lateral femoral cutaneous nerve with her.  Risks and benefits were reviewed in great detail.  We will plan on doing this with p.o. Valium. ? ? ?Orders:  ?Orders Placed This Encounter  ?Procedures  ? Radiofrequency ablation, other  ?  Standing Status:   Future  ?  Standing Expiration Date:   11/02/2021  ?  Scheduling Instructions:  ?   Right lateral femoral cutaneous nerve RFA with p.o. Valium  ?  Order Specific Question:   Where will this procedure be performed?  ?  Answer:   [facility]  ? ?Follow-up plan:   ?Return in about 2 weeks (around 05/17/2021) for R lateral femoral cutaneous RFA, ultrasound, in clinic (PO Valium).   ? ?Recent Visits ?Date Type Provider Dept  ?03/15/21 Office Visit Gillis Santa, MD [facility]  ?Showing  recent visits within past 90 days and meeting all other requirements ?Today's Visits ?Date Type Provider Dept  ?05/03/21 Office Visit Gillis Santa, MD [facility]   ?Showing today's visits and meeting all other requirements ?Future Appointments ?No visits were found meeting these conditions. ?Showing future appointments within next 90 days and meeting all other requiremen

## 2021-05-04 ENCOUNTER — Other Ambulatory Visit: Payer: Self-pay | Admitting: Family Medicine

## 2021-05-04 ENCOUNTER — Other Ambulatory Visit: Payer: Self-pay

## 2021-05-04 DIAGNOSIS — G43009 Migraine without aura, not intractable, without status migrainosus: Secondary | ICD-10-CM

## 2021-05-04 NOTE — Patient Instructions (Signed)

## 2021-05-05 ENCOUNTER — Other Ambulatory Visit: Payer: Self-pay

## 2021-05-05 MED FILL — Sumatriptan Succinate Tab 100 MG: ORAL | 30 days supply | Qty: 9 | Fill #0 | Status: AC

## 2021-05-08 ENCOUNTER — Other Ambulatory Visit: Payer: Self-pay

## 2021-05-10 ENCOUNTER — Other Ambulatory Visit: Payer: Self-pay

## 2021-05-11 ENCOUNTER — Encounter: Payer: Self-pay | Admitting: Family Medicine

## 2021-05-11 ENCOUNTER — Telehealth: Payer: Self-pay

## 2021-05-11 NOTE — Telephone Encounter (Signed)
Insurance denied auth for RFA. Stating not medically necessary. Called patient and left vm letting her know about the denial.  ?

## 2021-05-11 NOTE — Telephone Encounter (Signed)
Please review.  KP

## 2021-05-12 NOTE — Telephone Encounter (Signed)
Ok to have her call and schedule?

## 2021-05-18 ENCOUNTER — Ambulatory Visit: Payer: No Typology Code available for payment source | Admitting: Family Medicine

## 2021-05-25 ENCOUNTER — Encounter: Payer: Self-pay | Admitting: Family Medicine

## 2021-05-26 ENCOUNTER — Other Ambulatory Visit: Payer: Self-pay

## 2021-05-26 ENCOUNTER — Encounter: Payer: Self-pay | Admitting: Family Medicine

## 2021-05-26 ENCOUNTER — Ambulatory Visit (INDEPENDENT_AMBULATORY_CARE_PROVIDER_SITE_OTHER): Payer: No Typology Code available for payment source | Admitting: Family Medicine

## 2021-05-26 VITALS — BP 128/80 | HR 94 | Temp 97.7°F | Resp 14 | Ht 61.0 in | Wt 177.8 lb

## 2021-05-26 DIAGNOSIS — F39 Unspecified mood [affective] disorder: Secondary | ICD-10-CM

## 2021-05-26 DIAGNOSIS — R438 Other disturbances of smell and taste: Secondary | ICD-10-CM | POA: Diagnosis not present

## 2021-05-26 DIAGNOSIS — E8881 Metabolic syndrome: Secondary | ICD-10-CM

## 2021-05-26 DIAGNOSIS — J4530 Mild persistent asthma, uncomplicated: Secondary | ICD-10-CM

## 2021-05-26 DIAGNOSIS — E538 Deficiency of other specified B group vitamins: Secondary | ICD-10-CM

## 2021-05-26 DIAGNOSIS — G8929 Other chronic pain: Secondary | ICD-10-CM

## 2021-05-26 DIAGNOSIS — U099 Post covid-19 condition, unspecified: Secondary | ICD-10-CM

## 2021-05-26 DIAGNOSIS — M25532 Pain in left wrist: Secondary | ICD-10-CM

## 2021-05-26 DIAGNOSIS — Z113 Encounter for screening for infections with a predominantly sexual mode of transmission: Secondary | ICD-10-CM

## 2021-05-26 DIAGNOSIS — G43009 Migraine without aura, not intractable, without status migrainosus: Secondary | ICD-10-CM

## 2021-05-26 DIAGNOSIS — M25531 Pain in right wrist: Secondary | ICD-10-CM

## 2021-05-26 DIAGNOSIS — K219 Gastro-esophageal reflux disease without esophagitis: Secondary | ICD-10-CM

## 2021-05-26 DIAGNOSIS — Z862 Personal history of diseases of the blood and blood-forming organs and certain disorders involving the immune mechanism: Secondary | ICD-10-CM

## 2021-05-26 DIAGNOSIS — E559 Vitamin D deficiency, unspecified: Secondary | ICD-10-CM

## 2021-05-26 DIAGNOSIS — E669 Obesity, unspecified: Secondary | ICD-10-CM

## 2021-05-26 DIAGNOSIS — M25551 Pain in right hip: Secondary | ICD-10-CM

## 2021-05-26 DIAGNOSIS — E785 Hyperlipidemia, unspecified: Secondary | ICD-10-CM

## 2021-05-26 DIAGNOSIS — R232 Flushing: Secondary | ICD-10-CM

## 2021-05-26 MED ORDER — DICLOFENAC SODIUM 1 % EX GEL
2.0000 g | Freq: Four times a day (QID) | CUTANEOUS | 1 refills | Status: DC
  Filled 2021-05-26: qty 100, 12d supply, fill #0
  Filled 2021-07-12: qty 100, 12d supply, fill #1

## 2021-05-26 MED ORDER — SUMATRIPTAN SUCCINATE 100 MG PO TABS
ORAL_TABLET | ORAL | 2 refills | Status: DC
Start: 2021-05-26 — End: 2021-09-29
  Filled 2021-05-26: qty 9, 30d supply, fill #0
  Filled 2021-07-13: qty 9, 30d supply, fill #1

## 2021-05-26 MED ORDER — UBRELVY 100 MG PO TABS
1.0000 | ORAL_TABLET | ORAL | 2 refills | Status: DC
  Filled 2021-05-26 – 2021-07-12 (×2): qty 16, 32d supply, fill #0

## 2021-05-26 MED ORDER — WEGOVY 1 MG/0.5ML ~~LOC~~ SOAJ
1.0000 mg | SUBCUTANEOUS | 0 refills | Status: DC
  Filled 2021-05-26 – 2021-06-09 (×4): qty 2, 28d supply, fill #0
  Filled 2021-07-13: qty 2, 28d supply, fill #1
  Filled 2021-08-15: qty 2, 28d supply, fill #2

## 2021-05-26 MED ORDER — DESVENLAFAXINE SUCCINATE ER 50 MG PO TB24
ORAL_TABLET | Freq: Every day | ORAL | 1 refills | Status: DC
  Filled 2021-05-26 – 2021-08-09 (×2): qty 90, 90d supply, fill #0
  Filled 2021-11-24: qty 90, 90d supply, fill #1

## 2021-05-26 MED ORDER — PANTOPRAZOLE SODIUM 40 MG PO TBEC
40.0000 mg | DELAYED_RELEASE_TABLET | Freq: Every day | ORAL | 0 refills | Status: DC
  Filled 2021-05-26: qty 90, 90d supply, fill #0

## 2021-05-26 NOTE — Progress Notes (Signed)
Name: Shelby Henson   MRN: [id number]    DOB: 1970/06/16   Date:05/26/2021 ? ?     Progress Note ? ?Subjective ? ?Chief Complaint ? ?Chief Complaint  ?Patient presents with  ? Follow-up  ? ? ?HPI ? ?[place] COVID-19: diagnosed 11/27/2019 had infusion on 11/30/2019. She states sense of smell and taste finally returned a few weeks ago .  ?  ?GERD: resolved with addition of Pantoprazole, no heartburn , indigestion or epigastric pain , discussed she may try to skips days  ?  ?Asthma mild persistent: . She had one flare earlier this year, she had mostly chest tightness used nebulizer more often and symptoms resolved. Currently no cough, wheezing or SOB ?  ?Metabolic Syndrome/Morbid obesity: she is taking Ozempic, weight has been stable however insurance will no longer pay, we will try sending a rx for [facility]. She denies polyphagia, polydipsia or polyuria. She used to be morbidly obese with BMI above 35 with co-morbidities , GERD, OA of right hip, metabolic syndrome , however lost weight on Ozempic . Weight used to be in the 170 lbs range, however in 2021 it spiked to 193 lbs, we started her on Ozempic lost down to 170 lbs but Covid and did not feel well and stopped medication, weight went up again to 180 lbs and we resumed medication in Dec 2022. We will try Wegovy ?  ?Chronic right hip pain: s/p replacement but still on modified duty at work and has daily pain, no longer seeing pain clinic, but sees Dr. Harlow Mares once a year. She takes Meloxicam daily, but currently taking it mostly for her thumb tendinitis and arthritis, advised to switch to Voltaren.  Pain on her hip is described as burning, dull and aching, constant, still requires her to shift positions frequently to control symptoms. Seeing neurologist now , had MRI and has mild arthritis lumbar spine, and Dr. Manuella Ghazi is ordering NCS and EMG it showed meralgia paresthetica . She is on higher dose of gabapentin also had two injections done by Dr. Zigmund Daniel and will have  another set of injections soon  ?  ?Insomnia: She was on Trazodone but we tried giving her Margarita Mail but she was not able to fill it. Lunesta and Trazodone  made her moody, she also tried Temazepam. ?  ?Mood swings :she has been on Pritiq for many years, she has a family history of bipolar She denies side effects, no anxiety symptoms, phq was negative Continue medications ?  ?Low B12:she has not  taking supplementation and needs to have labs done  ?  ?Anemia unspecified: she has a history of iron deficiency anemia and has not been taking supplements, advised a MVI with iron .We need to recheck labs  ?  ?Migraine headache: she states symptoms usually intense , associated with nausea, photosensitivity, throbbing like and sometimes dull. She is having episodes twice a week She has Ubrelvy but not taking for prevention  ? ?Patient Active Problem List  ? Diagnosis Date Noted  ? Meralgia paresthetica of right side 03/29/2021  ? Psoas tendinitis, right hip 03/29/2021  ? History of total right hip arthroplasty 03/29/2021  ? Status post total replacement of right hip 02/19/2021  ? Meralgia paresthetica of right side 02/19/2021  ? Polyp of ascending colon 02/19/2021  ? Polyp of sigmoid colon 02/19/2021  ? Special screening for malignant neoplasms, colon   ? Polyp of ascending colon   ? Polyp of sigmoid colon   ? COVID-19 long hauler manifesting  chronic loss of smell and taste 03/23/2020  ? Pain in right hip 01/12/2019  ? Vitamin D deficiency 02/25/2018  ? Chronic pain 10/24/2015  ? Hot flashes 10/24/2015  ? Migraine without aura and without status migrainosus, not intractable 10/24/2015  ? Metabolic syndrome 73/71/0626  ? Dyslipidemia 10/24/2015  ? Allergic rhinitis, seasonal 05/17/2015  ? Asthma, mild intermittent 08/08/2014  ? Chronic insomnia 08/08/2014  ? DDD (degenerative disc disease), lumbar 08/08/2014  ? Mood disorder ([place]) 08/08/2014  ? Gastro-esophageal reflux disease without esophagitis 08/08/2014  ? H/O total  hysterectomy 08/08/2014  ? Irritable bowel syndrome with diarrhea 08/08/2014  ? Osteoarthritis of right hip 08/08/2014  ? Adult BMI 30+ 08/08/2014  ? SBO (spina bifida occulta) 08/08/2014  ? ? ?Past Surgical History:  ?Procedure Laterality Date  ? ABDOMINAL HYSTERECTOMY    ? ABLATION    ? BREAST CYST ASPIRATION Left   ? CESAREAN SECTION    ? CESAREAN SECTION    ? CESAREAN SECTION    ? COLONOSCOPY N/A 09/22/2020  ? Procedure: COLONOSCOPY;  Surgeon: Virgel Manifold, MD;  Location: [facility] ENDOSCOPY;  Service: Endoscopy;  Laterality: N/A;  ? HIP SURGERY  10/2018  ? LAPAROSCOPY  2018  ? LEEP    ? TOTAL HIP ARTHROPLASTY Right 11/12/2018  ? Procedure: TOTAL HIP ARTHROPLASTY ANTERIOR APPROACH;  Surgeon: Lovell Sheehan, MD;  Location: [facility] ORS;  Service: Orthopedics;  Laterality: Right;  ? TOTAL VAGINAL HYSTERECTOMY    ? ? ?Family History  ?Problem Relation Age of Onset  ? Bipolar disorder Brother   ? Depression Mother   ? Breast cancer Neg Hx   ? ? ?Social History  ? ?Tobacco Use  ? Smoking status: Never  ? Smokeless tobacco: Never  ?Substance Use Topics  ? Alcohol use: Yes  ?  Alcohol/week: 0.0 standard drinks  ?  Comment: occasionally  ? ? ? ?Current Outpatient Medications:  ?  albuterol (PROVENTIL) (2.5 MG/3ML) 0.083% nebulizer solution, USE ONE VIAL VIA NEBULIZER EVERY 6 HOURS AS NEEDED FOR WHEEZING OR SHORTNESS OF BREATH., Disp: 75 mL, Rfl: 2 ?  budesonide (PULMICORT) 0.5 MG/2ML nebulizer solution, TAKE 2 MLS BY NEBULIZATION 2 TIMES DAILY., Disp: 60 mL, Rfl: 5 ?  cloNIDine (CATAPRES) 0.2 MG tablet, Take 1 tablet (0.2 mg total) by mouth daily., Disp: 90 tablet, Rfl: 3 ?  desvenlafaxine (PRISTIQ) 50 MG 24 hr tablet, TAKE 1 TABLET BY MOUTH DAILY., Disp: 90 tablet, Rfl: 1 ?  gabapentin (NEURONTIN) 300 MG capsule, Take 3 capsules (900 mg total) by mouth at bedtime, Disp: 180 capsule, Rfl: 3 ?  pantoprazole (PROTONIX) 40 MG tablet, Take 1 tablet (40 mg total) by mouth daily., Disp: 90 tablet, Rfl: 0 ?  Semaglutide,0.25 or  0.'5MG'$ /DOS, (OZEMPIC, 0.25 OR 0.5 MG/DOSE,) 2 MG/1.5ML SOPN, Inject 0.5 mg into the skin once a week., Disp: 4.5 mL, Rfl: 0 ?  SUMAtriptan (IMITREX) 100 MG tablet, TAKE 1 TABLET BY MOUTH EVERY 2 HOURS AS NEEDED FOR MIGRAINE. MAY REPEAT IN 2 HOURS IF HEADACHE PERSISTS OR RECURS., Disp: 9 tablet, Rfl: 0 ?  traZODone (DESYREL) 100 MG tablet, Take 1 tablet (100 mg total) by mouth at bedtime., Disp: 90 tablet, Rfl: 0 ?  Ubrogepant (UBRELVY) 100 MG TABS, Take 1 tablet by mouth every other day., Disp: 16 tablet, Rfl: 2 ? ?Allergies  ?Allergen Reactions  ? Celecoxib Hives  ? Prednisone   ?  intolerance, makes her angry. ?Makes her angry  ? Topamax [Topiramate]   ?  Mood changes  ?  Zolpidem   ? Codeine Rash  ? Penicillins Rash  ?  Did it involve swelling of the face/tongue/throat, SOB, or low BP? No ?Did it involve sudden or severe rash/hives, skin peeling, or any reaction on the inside of your mouth or nose? Yes ?Did you need to seek medical attention at a hospital or doctor's office? Yes ?When did it last happen? More than 20 years ago       ?If all above answers are ?NO?, may proceed with cephalosporin use. ?  ? ? ?I personally reviewed active problem list, medication list, allergies, family history, social history with the patient/caregiver today. ? ? ?ROS ? ?Constitutional: Negative for fever or weight change.  ?Respiratory: Negative for cough and shortness of breath.   ?Cardiovascular: Negative for chest pain or palpitations.  ?Gastrointestinal: Negative for abdominal pain, no bowel changes.  ?Musculoskeletal: Negative for gait problem or joint swelling.  ?Skin: Negative for rash.  ?Neurological: Negative for dizziness or headache.  ?No other specific complaints in a complete review of systems (except as listed in HPI above).  ? ?Objective ? ?Vitals:  ? 05/26/21 0852  ?BP: 128/80  ?Pulse: 94  ?Resp: 14  ?Temp: 97.7 ?F (36.5 ?C)  ?TempSrc: Oral  ?SpO2: 98%  ?Weight: 177 lb 12.8 oz (80.6 kg)  ?Height: '5\' 1"'$  (1.549 m)   ? ? ?Body mass index is 33.6 kg/m?. ? ?Physical Exam ? ?Constitutional: Patient appears well-developed and well-nourished. Obese  No distress.  ?HEENT: head atraumatic, normocephalic, pupils equal and reactive t

## 2021-05-28 LAB — VITAMIN B12: Vitamin B-12: 279 pg/mL (ref 200–1100)

## 2021-05-28 LAB — CBC WITH DIFFERENTIAL/PLATELET
Absolute Monocytes: 543 cells/uL (ref 200–950)
Basophils Absolute: 57 cells/uL (ref 0–200)
Basophils Relative: 0.7 %
Eosinophils Absolute: 729 cells/uL — ABNORMAL HIGH (ref 15–500)
Eosinophils Relative: 9 %
HCT: 40.3 % (ref 35.0–45.0)
Hemoglobin: 13.3 g/dL (ref 11.7–15.5)
Lymphs Abs: 1839 cells/uL (ref 850–3900)
MCH: 28.4 pg (ref 27.0–33.0)
MCHC: 33 g/dL (ref 32.0–36.0)
MCV: 85.9 fL (ref 80.0–100.0)
MPV: 11.6 fL (ref 7.5–12.5)
Monocytes Relative: 6.7 %
Neutro Abs: 4933 cells/uL (ref 1500–7800)
Neutrophils Relative %: 60.9 %
Platelets: 282 10*3/uL (ref 140–400)
RBC: 4.69 10*6/uL (ref 3.80–5.10)
RDW: 14.9 % (ref 11.0–15.0)
Total Lymphocyte: 22.7 %
WBC: 8.1 10*3/uL (ref 3.8–10.8)

## 2021-05-28 LAB — LIPID PANEL
Cholesterol: 212 mg/dL — ABNORMAL HIGH (ref <200)
HDL: 63 mg/dL (ref >=50)
LDL Cholesterol (Calc): 125 mg/dL (calc) — ABNORMAL HIGH
Non-HDL Cholesterol (Calc): 149 mg/dL (calc) — ABNORMAL HIGH (ref <130)
Total CHOL/HDL Ratio: 3.4 (calc) (ref <5.0)
Triglycerides: 127 mg/dL (ref <150)

## 2021-05-28 LAB — IRON,TIBC AND FERRITIN PANEL
%SAT: 16 % (calc) (ref 16–45)
Ferritin: 19 ng/mL (ref 16–232)
Iron: 64 ug/dL (ref 45–160)
TIBC: 404 mcg/dL (calc) (ref 250–450)

## 2021-05-28 LAB — COMPLETE METABOLIC PANEL WITH GFR
AG Ratio: 1.4 (calc) (ref 1.0–2.5)
ALT: 19 U/L (ref 6–29)
AST: 17 U/L (ref 10–35)
Albumin: 4.2 g/dL (ref 3.6–5.1)
Alkaline phosphatase (APISO): 101 U/L (ref 37–153)
BUN: 17 mg/dL (ref 7–25)
CO2: 27 mmol/L (ref 20–32)
Calcium: 9.8 mg/dL (ref 8.6–10.4)
Chloride: 105 mmol/L (ref 98–110)
Creat: 0.86 mg/dL (ref 0.50–1.03)
Globulin: 3 g/dL (calc) (ref 1.9–3.7)
Glucose, Bld: 67 mg/dL (ref 65–99)
Potassium: 3.9 mmol/L (ref 3.5–5.3)
Sodium: 142 mmol/L (ref 135–146)
Total Bilirubin: 0.4 mg/dL (ref 0.2–1.2)
Total Protein: 7.2 g/dL (ref 6.1–8.1)
eGFR: 81 mL/min/{1.73_m2} (ref >=60)

## 2021-05-28 LAB — HEMOGLOBIN A1C
Hgb A1c MFr Bld: 5.3 % of total Hgb (ref <5.7)
Mean Plasma Glucose: 105 mg/dL
eAG (mmol/L): 5.8 mmol/L

## 2021-05-28 LAB — HIV ANTIBODY (ROUTINE TESTING W REFLEX): HIV 1&2 Ab, 4th Generation: NONREACTIVE

## 2021-05-28 LAB — RPR: RPR Ser Ql: NONREACTIVE

## 2021-05-30 ENCOUNTER — Encounter: Payer: Self-pay | Admitting: Family Medicine

## 2021-05-30 ENCOUNTER — Other Ambulatory Visit: Payer: Self-pay

## 2021-06-02 ENCOUNTER — Inpatient Hospital Stay (INDEPENDENT_AMBULATORY_CARE_PROVIDER_SITE_OTHER): Payer: No Typology Code available for payment source | Admitting: Radiology

## 2021-06-02 ENCOUNTER — Encounter: Payer: Self-pay | Admitting: Family Medicine

## 2021-06-02 ENCOUNTER — Ambulatory Visit (INDEPENDENT_AMBULATORY_CARE_PROVIDER_SITE_OTHER): Payer: No Typology Code available for payment source | Admitting: Family Medicine

## 2021-06-02 VITALS — BP 138/84 | HR 84 | Ht 59.0 in | Wt 177.0 lb

## 2021-06-02 DIAGNOSIS — M25551 Pain in right hip: Secondary | ICD-10-CM

## 2021-06-02 DIAGNOSIS — G5711 Meralgia paresthetica, right lower limb: Secondary | ICD-10-CM

## 2021-06-02 DIAGNOSIS — G8929 Other chronic pain: Secondary | ICD-10-CM

## 2021-06-02 MED ORDER — TRIAMCINOLONE ACETONIDE 40 MG/ML IJ SUSP
40.0000 mg | Freq: Once | INTRAMUSCULAR | Status: AC
  Administered 2021-06-02: 40 mg via INTRAMUSCULAR

## 2021-06-02 NOTE — Patient Instructions (Signed)
You have just been given a cortisone injection to reduce pain and inflammation. After the injection you may notice immediate relief of pain as a result of the Lidocaine. It is important to rest the area of the injection for 24 to 48 hours after the injection. There is a possibility of some temporary increased discomfort and swelling for up to 72 hours until the cortisone begins to work. If you do have pain, simply rest the joint and use ice. If you can tolerate over the counter medications, you can try Tylenol, Aleve, or Advil for added relief per package instructions. ?-As above, relative rest x2 days and rest return to normal physical activity ?- Contact her office in 2 weeks to provide a status update or for any questions between now and then ?

## 2021-06-02 NOTE — Assessment & Plan Note (Addendum)
Patient returns for follow-up to right chronic hip pain in the setting of total hip arthroplasty with primary concern for lateral femoral cutaneous nerve involvement/meralgia paresthetica.  Of note, at her last visit on 04/13/2021 we proceeded with a diagnostic/therapeutic corticosteroid injection which resulted in 2 days of being asymptomatic.  She then had a subsequent visit with pain and spine group Dr. Gillis Santa for planned LFCN ablation, unfortunately insurance denied this procedure.  She now returns for repeat corticosteroid injection evaluation.  She does state that since 04/13/2021, while she is still symptomatic, does note overall improvement.  She still describes discomfort in the distribution of the LFCN and she did elect to proceed with ultrasound-guided lateral femoral cutaneous nerve injection with cortisone. ? ?Patient tolerated the procedure well, post care reviewed, she is to restart home exercises after period of rest, and contact us for any recalcitrant symptomatology, can otherwise follow-up as needed. ? ?Office visit and telephone encounter notes from 05/01/2021, 05/02/2021, and 05/03/2021 were reviewed from pain and spine group under Dr. Gillis Santa; additionally PCP visit with Dr. Steele Sizer from 05/26/2021 note reviewed. ?

## 2021-06-02 NOTE — Progress Notes (Signed)
?  ? ?  Primary Care / Sports Medicine Office Visit ? ?Patient Information:  ?Patient ID: Shelby Henson, female DOB: 1970/09/24 Age: 51 y.o. MRN: [id number]  ? ?Shelby Henson is a pleasant 51 y.o. female presenting with the following: ? ?Chief Complaint  ?Patient presents with  ? Hip Pain  ? ? ?Vitals:  ? 06/02/21 1428  ?BP: 138/84  ?Pulse: 84  ?SpO2: 98%  ? ?Vitals:  ? 06/02/21 1428  ?Weight: 177 lb (80.3 kg)  ?Height: '4\' 11"'$  (1.499 m)  ? ?Body mass index is 35.75 kg/m?. ? ?No results found.  ? ?Independent interpretation of notes and tests performed by another provider:  ? ?None ? ?Procedures performed:  ? ?Procedure:  Injection of right lateral femoral cutaneous nerve under ultrasound guidance. ?Ultrasound guidance utilized for in-plane approach lateral to medial of the lateral femoral cutaneous nerve at the sartorius, no hypoechoic abnormalities noted, injectate visualized surrounding the lateral femoral cutaneous nerve ?Samsung HS60 device utilized with permanent recording / reporting. ?Verbal informed consent obtained and verified. ?Skin prepped in a sterile fashion. ?Ethyl chloride for topical local analgesia.  ?Completed without difficulty and tolerated well. ?Medication: triamcinolone acetonide 40 mg/mL suspension for injection 1 mL total and 2 mL lidocaine 1% without epinephrine utilized for needle placement anesthetic ?Advised to contact for fevers/chills, erythema, induration, drainage, or persistent bleeding. ? ? ?Pertinent History, Exam, Impression, and Recommendations:  ? ?Problem List Items Addressed This Visit   ? ?  ? Nervous and Auditory  ? Meralgia paresthetica of right side - Primary  ?  Patient returns for follow-up to right chronic hip pain in the setting of total hip arthroplasty with primary concern for lateral femoral cutaneous nerve involvement/meralgia paresthetica.  Of note, at her last visit on 04/13/2021 we proceeded with a diagnostic/therapeutic corticosteroid injection which resulted in 2  days of being asymptomatic.  She then had a subsequent visit with pain and spine group Dr. Gillis Santa for planned LFCN ablation, unfortunately insurance denied this procedure.  She now returns for repeat corticosteroid injection evaluation.  She does state that since 04/13/2021, while she is still symptomatic, does note overall improvement.  She still describes discomfort in the distribution of the LFCN and she did elect to proceed with ultrasound-guided lateral femoral cutaneous nerve injection with cortisone. ? ?Patient tolerated the procedure well, post care reviewed, she is to restart home exercises after period of rest, and contact us for any recalcitrant symptomatology, can otherwise follow-up as needed. ? ?Office visit and telephone encounter notes from 05/01/2021, 05/02/2021, and 05/03/2021 were reviewed from pain and spine group under Dr. Gillis Santa; additionally PCP visit with Dr. Steele Sizer from 05/26/2021 note reviewed. ?  ?  ? ?Other Visit Diagnoses   ? ? Chronic right hip pain      ? Relevant Medications  ? triamcinolone acetonide (KENALOG-40) injection 40 mg (Completed)  ? Other Relevant Orders  ? [place] LIMITED JOINT SPACE STRUCTURES LOW RIGHT  ? ?  ?  ? ?Orders & Medications ?Meds ordered this encounter  ?Medications  ? triamcinolone acetonide (KENALOG-40) injection 40 mg  ? ?Orders Placed This Encounter  ?Procedures  ? [place] LIMITED JOINT SPACE STRUCTURES LOW RIGHT  ?  ? ?Return if symptoms worsen or fail to improve.  ?  ? ?Montel Culver, MD ? ? Primary Care Sports Medicine ?[facility] ?[place]  ? ?

## 2021-06-05 ENCOUNTER — Other Ambulatory Visit: Payer: Self-pay

## 2021-06-07 ENCOUNTER — Other Ambulatory Visit: Payer: Self-pay

## 2021-06-09 ENCOUNTER — Ambulatory Visit (INDEPENDENT_AMBULATORY_CARE_PROVIDER_SITE_OTHER): Payer: No Typology Code available for payment source

## 2021-06-09 ENCOUNTER — Other Ambulatory Visit: Payer: Self-pay

## 2021-06-09 DIAGNOSIS — E538 Deficiency of other specified B group vitamins: Secondary | ICD-10-CM

## 2021-06-09 MED ORDER — CYANOCOBALAMIN 1000 MCG/ML IJ SOLN
1000.0000 ug | Freq: Once | INTRAMUSCULAR | Status: AC
  Administered 2021-06-09: 1000 ug via INTRAMUSCULAR

## 2021-06-19 ENCOUNTER — Encounter: Payer: Self-pay | Admitting: Family Medicine

## 2021-06-19 NOTE — Telephone Encounter (Signed)
FYI

## 2021-06-29 ENCOUNTER — Other Ambulatory Visit: Payer: Self-pay | Admitting: Family Medicine

## 2021-06-29 ENCOUNTER — Encounter: Payer: Self-pay | Admitting: Family Medicine

## 2021-06-29 DIAGNOSIS — J4520 Mild intermittent asthma, uncomplicated: Secondary | ICD-10-CM

## 2021-06-29 MED ORDER — ALBUTEROL SULFATE (2.5 MG/3ML) 0.083% IN NEBU
INHALATION_SOLUTION | RESPIRATORY_TRACT | 2 refills | Status: DC
  Filled 2021-07-12: qty 75, 6d supply, fill #0
  Filled 2021-08-09: qty 75, 6d supply, fill #1
  Filled 2021-09-12: qty 75, 6d supply, fill #2

## 2021-06-29 NOTE — Telephone Encounter (Signed)
Please review.  KP

## 2021-07-12 ENCOUNTER — Other Ambulatory Visit: Payer: Self-pay

## 2021-07-13 ENCOUNTER — Other Ambulatory Visit: Payer: Self-pay

## 2021-08-03 ENCOUNTER — Ambulatory Visit (INDEPENDENT_AMBULATORY_CARE_PROVIDER_SITE_OTHER): Payer: No Typology Code available for payment source | Admitting: Family Medicine

## 2021-08-03 ENCOUNTER — Inpatient Hospital Stay (INDEPENDENT_AMBULATORY_CARE_PROVIDER_SITE_OTHER): Payer: No Typology Code available for payment source | Admitting: Radiology

## 2021-08-03 ENCOUNTER — Encounter: Payer: Self-pay | Admitting: Family Medicine

## 2021-08-03 VITALS — BP 120/70 | HR 78 | Ht 59.0 in | Wt 175.0 lb

## 2021-08-03 DIAGNOSIS — G5711 Meralgia paresthetica, right lower limb: Secondary | ICD-10-CM | POA: Diagnosis not present

## 2021-08-03 MED ORDER — TRIAMCINOLONE ACETONIDE 40 MG/ML IJ SUSP
40.0000 mg | Freq: Once | INTRAMUSCULAR | Status: AC
  Administered 2021-08-03: 40 mg via INTRAMUSCULAR

## 2021-08-03 NOTE — Patient Instructions (Signed)
You have just been given a cortisone injection to reduce pain and inflammation. After the injection you may notice immediate relief of pain as a result of the Lidocaine. It is important to rest the area of the injection for 24 to 48 hours after the injection. There is a possibility of some temporary increased discomfort and swelling for up to 72 hours until the cortisone begins to work. If you do have pain, simply rest the joint and use ice. If you can tolerate over the counter medications, you can try Tylenol, Aleve, or Advil for added relief per package instructions. -Contact us for any questions and follow-up with

## 2021-08-03 NOTE — Assessment & Plan Note (Signed)
Patient had essentially 1 month of excellent symptom response following lateral femoral cutaneous nerve injection performed on 06/02/2021.  Was unable to get coverage for nerve ablation due to insurance issues.  She has elected to repeat this procedure today.  I will reach out to pain management group/Dr. Gillis Santa to pursue additional options given our localization of her pain to her lateral femoral cutaneous nerve on the right.

## 2021-08-03 NOTE — Progress Notes (Signed)
     Primary Care / Sports Medicine Office Visit  Patient Information:  Patient ID: Shelby Henson, female DOB: 1970-07-13 Age: 51 y.o. MRN: [id number]   Shelby Henson is a pleasant 51 y.o. female presenting with the following:  Chief Complaint  Patient presents with   Meralgia paresthetica of right side   Shoulder Pain    Right, would like a xray, is popping and cracking.     Vitals:   08/03/21 1414  BP: 120/70  Pulse: 78  SpO2: 98%   Vitals:   08/03/21 1414  Weight: 175 lb (79.4 kg)  Height: '4\' 11"'$  (1.499 m)   Body mass index is 35.35 kg/m.  No results found.   Independent interpretation of notes and tests performed by another provider:   None  Procedures performed:   Procedure:  Injection of right lateral femoral cutaneous nerve under ultrasound guidance. Ultrasound guidance utilized for in-plane approach lateral to medial of the lateral femoral cutaneous nerve at the sartorius, no hypoechoic abnormalities noted, injectate visualized surrounding the lateral femoral cutaneous nerve Samsung HS60 device utilized with permanent recording / reporting. Verbal informed consent obtained and verified. Skin prepped in a sterile fashion. Ethyl chloride for topical local analgesia.  Completed without difficulty and tolerated well. Medication: triamcinolone acetonide 40 mg/mL suspension for injection 1 mL total and 2 mL lidocaine 1% without epinephrine utilized for needle placement anesthetic Advised to contact for fevers/chills, erythema, induration, drainage, or persistent bleeding.  Pertinent History, Exam, Impression, and Recommendations:   Problem List Items Addressed This Visit       Nervous and Auditory   Meralgia paresthetica of right side - Primary    Patient had essentially 1 month of excellent symptom response following lateral femoral cutaneous nerve injection performed on 06/02/2021.  Was unable to get coverage for nerve ablation due to insurance issues.  She has  elected to repeat this procedure today.  I will reach out to pain management group/Dr. Gillis Santa to pursue additional options given our localization of her pain to her lateral femoral cutaneous nerve on the right.      Relevant Orders   [place] LIMITED JOINT SPACE STRUCTURES LOW RIGHT     Orders & Medications Meds ordered this encounter  Medications   triamcinolone acetonide (KENALOG-40) injection 40 mg   Orders Placed This Encounter  Procedures   [place] LIMITED JOINT SPACE STRUCTURES LOW RIGHT     No follow-ups on file.     Montel Culver, MD   Primary Care Sports Medicine [place]

## 2021-08-08 ENCOUNTER — Other Ambulatory Visit: Payer: Self-pay

## 2021-08-08 ENCOUNTER — Ambulatory Visit
Admission: RE | Admit: 2021-08-08 | Discharge: 2021-08-08 | Disposition: A | Discharge status: Home / Self Care | Payer: No Typology Code available for payment source | Source: Ambulatory Visit | Attending: Family Medicine | Admitting: Family Medicine

## 2021-08-08 ENCOUNTER — Ambulatory Visit
Admission: RE | Admit: 2021-08-08 | Discharge: 2021-08-08 | Disposition: A | Discharge status: Home / Self Care | Payer: No Typology Code available for payment source | Attending: Family Medicine | Admitting: Family Medicine

## 2021-08-08 DIAGNOSIS — G8929 Other chronic pain: Secondary | ICD-10-CM

## 2021-08-08 DIAGNOSIS — M25511 Pain in right shoulder: Secondary | ICD-10-CM | POA: Insufficient documentation

## 2021-08-09 ENCOUNTER — Other Ambulatory Visit: Payer: Self-pay

## 2021-08-09 ENCOUNTER — Encounter: Payer: Self-pay | Admitting: Family Medicine

## 2021-08-10 ENCOUNTER — Other Ambulatory Visit: Payer: Self-pay

## 2021-08-10 NOTE — Telephone Encounter (Signed)
Please advise 

## 2021-08-11 ENCOUNTER — Ambulatory Visit: Payer: No Typology Code available for payment source | Admitting: Family Medicine

## 2021-08-14 ENCOUNTER — Encounter: Payer: Self-pay | Admitting: Family Medicine

## 2021-08-14 NOTE — Telephone Encounter (Signed)
Please advise 

## 2021-08-15 ENCOUNTER — Other Ambulatory Visit: Payer: Self-pay

## 2021-08-22 ENCOUNTER — Other Ambulatory Visit: Payer: Self-pay

## 2021-08-22 DIAGNOSIS — G8929 Other chronic pain: Secondary | ICD-10-CM

## 2021-08-22 DIAGNOSIS — G5711 Meralgia paresthetica, right lower limb: Secondary | ICD-10-CM

## 2021-08-24 NOTE — Progress Notes (Signed)
Name: Shelby Henson   MRN: [id number]    DOB: [birth year]   Date:08/25/2021       Progress Note  Subjective  Chief Complaint  Follow Up  HPI  GERD: resolved, she stopped PPI , she states noticed that she has not been able to eat, and thought secondary to high dose of Wegovy . Discuss considering going back on medication to see if symptoms improves    Asthma mild persistent: . She had one flare earlier this year, she had mostly chest tightness used nebulizer more often and symptoms resolved. Currently no cough, wheezing or SOB. Unchanged    Metabolic Syndrome/Morbid obesity: she is taking Ozempic, weight has been stable however insurance will no longer pay, we will try sending a rx for [facility]. She denies polyphagia, polydipsia or polyuria. She used to be morbidly obese with BMI above 35 with co-morbidities , GERD, OA of right hip, metabolic syndrome , however lost weight on Ozempic . Weight used to be in the 170 lbs range, however in 2021 it spiked to 193 lbs, we started her on Ozempic lost down to 170 lbs but Covid and did not feel well and stopped medication, weight went up again to 180 lbs and we resumed medication in Dec 2022. We gave ger [facility] in April and her weight was 177 lbs she states the 1 mg dose made her unable to eat for weeks and she decided to stop taking it about one month ago and weight is trending down. Discussed going down on the dose    Chronic right hip pain: s/p replacement but still on modified duty at work and has daily pain, no longer seeing pain clinic, but sees Dr. Harlow Mares once a year. She takes Meloxicam daily, but currently taking it mostly for her thumb tendinitis and arthritis, advised to switch to Voltaren.  Pain on her hip is described as burning, dull and aching, constant, still requires her to shift positions frequently to control symptoms. Seeing neurologist now , had MRI and has mild arthritis lumbar spine, and Dr. Manuella Ghazi is ordering NCS and EMG it showed meralgia  paresthetica . She is on higher dose of gabapentin also had three  injections done by Dr. Zigmund Daniel but failed the treatment and is now going to [facility] for ablation    Insomnia: She was on Trazodone but we tried giving her Quiviviq but she was not able to fill it. Lunesta and Trazodone  made her moody, she also tried Temazepam. Discussed seroquel today but she is tired of taking medications   Mood swings :she has been on Pritiq for many years, she has a family history of bipolar She denies side effects, no anxiety symptoms, phq was negative Discussed consider Vraylar , she will think about it   Cold intolerance: she is feeling cold all the time, history of iron deficiency anemia and is worried   Low B12: last level was low and we resume monthly injections   Migraine headache: she states symptoms usually intense , associated with nausea, photosensitivity, throbbing like and sometimes dull. She is having episodes more often lately, about 3 times a week, she also has daily tension type headaches, we will try Emgality in place of Ubrelvy for maintenance and continue imitrex and ubrelvy prn, but needs to follow up with neurologist - Dr. Manuella Ghazi  - to discuss options.   Patient Active Problem List   Diagnosis Date Noted   Meralgia paresthetica of right side 03/29/2021   Psoas tendinitis, right hip  03/29/2021   History of total right hip arthroplasty 03/29/2021   Status post total replacement of right hip 02/19/2021   Meralgia paresthetica of right side 02/19/2021   Polyp of ascending colon 02/19/2021   Polyp of sigmoid colon 02/19/2021   Special screening for malignant neoplasms, colon    Polyp of ascending colon    Polyp of sigmoid colon    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 03/23/2020   Pain in right hip 01/12/2019   Vitamin D deficiency 02/25/2018   Chronic pain 10/24/2015   Hot flashes 10/24/2015   Migraine without aura and without status migrainosus, not intractable  67/20/9470   Metabolic syndrome 96/28/3662   Dyslipidemia 10/24/2015   Allergic rhinitis, seasonal 05/17/2015   Asthma, mild intermittent 08/08/2014   Chronic insomnia 08/08/2014   DDD (degenerative disc disease), lumbar 08/08/2014   Mood disorder ([place]) 08/08/2014   Gastro-esophageal reflux disease without esophagitis 08/08/2014   H/O total hysterectomy 08/08/2014   Irritable bowel syndrome with diarrhea 08/08/2014   Osteoarthritis of right hip 08/08/2014   Adult BMI 30+ 08/08/2014   SBO (spina bifida occulta) 08/08/2014    Past Surgical History:  Procedure Laterality Date   ABDOMINAL HYSTERECTOMY     ABLATION     BREAST CYST ASPIRATION Left    CESAREAN SECTION     CESAREAN SECTION     CESAREAN SECTION     COLONOSCOPY N/A 09/22/2020   Procedure: COLONOSCOPY;  Surgeon: Virgel Manifold, MD;  Location: [facility] ENDOSCOPY;  Service: Endoscopy;  Laterality: N/A;   HIP SURGERY  10/2018   LAPAROSCOPY  2018   LEEP     TOTAL HIP ARTHROPLASTY Right 11/12/2018   Procedure: TOTAL HIP ARTHROPLASTY ANTERIOR APPROACH;  Surgeon: Lovell Sheehan, MD;  Location: [facility] ORS;  Service: Orthopedics;  Laterality: Right;   TOTAL VAGINAL HYSTERECTOMY      Family History  Problem Relation Age of Onset   Bipolar disorder Brother    Depression Mother    Breast cancer Neg Hx     Social History   Tobacco Use   Smoking status: Never   Smokeless tobacco: Never  Substance Use Topics   Alcohol use: Yes    Alcohol/week: 0.0 standard drinks of alcohol    Comment: occasionally     Current Outpatient Medications:    albuterol (PROVENTIL) (2.5 MG/3ML) 0.083% nebulizer solution, USE ONE VIAL VIA NEBULIZER EVERY 6 HOURS AS NEEDED FOR WHEEZING OR SHORTNESS OF BREATH., Disp: 75 mL, Rfl: 2   budesonide (PULMICORT) 0.5 MG/2ML nebulizer solution, TAKE 2 MLS BY NEBULIZATION 2 TIMES DAILY., Disp: 60 mL, Rfl: 5   desvenlafaxine (PRISTIQ) 50 MG 24 hr tablet, TAKE 1 TABLET BY MOUTH DAILY., Disp: 90 tablet, Rfl:  1   diclofenac Sodium (VOLTAREN) 1 % GEL, Apply 2 g topically 4 (four) times daily., Disp: 100 g, Rfl: 1   gabapentin (NEURONTIN) 300 MG capsule, Take 3 capsules (900 mg total) by mouth at bedtime, Disp: 180 capsule, Rfl: 3   Semaglutide-Weight Management (WEGOVY) 1 MG/0.5ML SOAJ, Inject 1 mg into the skin once a week., Disp: 6 mL, Rfl: 0   SUMAtriptan (IMITREX) 100 MG tablet, TAKE 1 TABLET BY MOUTH EVERY 2 HOURS AS NEEDED FOR MIGRAINE. MAY REPEAT IN 2 HOURS IF HEADACHE PERSISTS OR RECURS., Disp: 9 tablet, Rfl: 2   Ubrogepant (UBRELVY) 100 MG TABS, Take 1 tablet by mouth every other day., Disp: 16 tablet, Rfl: 2  Allergies  Allergen Reactions   Celecoxib Hives  Prednisone     intolerance, makes her angry. Makes her angry   Topamax [Topiramate]     Mood changes   Zolpidem    Codeine Rash   Penicillins Rash    Did it involve swelling of the face/tongue/throat, SOB, or low BP? No Did it involve sudden or severe rash/hives, skin peeling, or any reaction on the inside of your mouth or nose? Yes Did you need to seek medical attention at a hospital or doctor's office? Yes When did it last happen? More than 20 years ago       If all above answers are "NO", may proceed with cephalosporin use.     I personally reviewed active problem list, medication list, allergies, family history, social history, health maintenance with the patient/caregiver today.   ROS  Ten systems reviewed and is negative except as mentioned in HPI   Objective  Vitals:   08/25/21 0929  BP: 116/78  Pulse: 94  Resp: 16  SpO2: 99%  Weight: 173 lb (78.5 kg)  Height: '4\' 11"'$  (1.499 m)    Body mass index is 34.94 kg/m.  Physical Exam  Constitutional: Patient appears well-developed and well-nourished. Obese  No distress.  HEENT: head atraumatic, normocephalic, pupils equal and reactive to light,, neck supple Cardiovascular: Normal rate, regular rhythm and normal heart sounds.  No murmur heard. No BLE  edema. Pulmonary/Chest: Effort normal and breath sounds normal. No respiratory distress. Abdominal: Soft.  There is no tenderness. Muscular skeletal: antalgia gait  Psychiatric: Patient has a normal mood and affect. behavior is normal. Judgment and thought content normal.    PHQ2/9:    08/25/2021    9:34 AM 08/03/2021    2:16 PM 06/02/2021    2:35 PM 05/26/2021    8:54 AM 04/13/2021    3:21 PM  Depression screen PHQ 2/9  Decreased Interest 0 0 0 0 2  Down, Depressed, Hopeless 0 0 0 0 0  PHQ - 2 Score 0 0 0 0 2  Altered sleeping '3 3 3 3 3  '$ Tired, decreased energy 0 1 0 0 1  Change in appetite 3 0 0 0 0  Feeling bad or failure about yourself  0 0 0 0 0  Trouble concentrating 0 '1 1 1 1  '$ Moving slowly or fidgety/restless 0 0 0 0 0  Suicidal thoughts 0 0 0 0 0  PHQ-9 Score '6 5 4 4 7  '$ Difficult doing work/chores  Not difficult at all Not difficult at all Not difficult at all Not difficult at all    phq 9 is negative   Fall Risk:    08/25/2021    9:29 AM 08/03/2021    2:16 PM 06/02/2021    2:36 PM 05/26/2021    8:54 AM 04/13/2021    3:22 PM  Fall Risk   Falls in the past year? 0 0 0 0 0  Number falls in past yr: 0  0  0  Injury with Fall? 0 0 0  0  Risk for fall due to : No Fall Risks  No Fall Risks No Fall Risks   Follow up Falls prevention discussed  Falls evaluation completed Falls prevention discussed       Functional Status Survey: Is the patient deaf or have difficulty hearing?: No Does the patient have difficulty seeing, even when wearing glasses/contacts?: No Does the patient have difficulty concentrating, remembering, or making decisions?: No Does the patient have difficulty walking or climbing stairs?: Yes Does the patient have  difficulty dressing or bathing?: No Does the patient have difficulty doing errands alone such as visiting a doctor's office or shopping?: No    Assessment & Plan  1. Migraine without aura and without status migrainosus, not intractable  -  Galcanezumab-gnlm (EMGALITY) 120 MG/ML SOAJ; Inject 1 mL into the skin every 30 (thirty) days.  Dispense: 1.12 mL; Refill: 5 - Ubrogepant (UBRELVY) 100 MG TABS; Take 1 tablet by mouth daily as needed. Migraines , for breakthrough only  Dispense: 16 tablet; Refill: 0  2. B12 deficiency   3. Mild persistent asthma without complication   4. Mood disorder ([place])  Discussed adding Vraylar but she wants to hold off  5. Meralgia paresthetica of right side   6. Metabolic syndrome   7. Obesity (BMI 30.0-34.9)  - Semaglutide-Weight Management 0.25 MG/0.5ML SOAJ; Inject 0.25 mg into the skin once a week for 28 days.  Dispense: 2 mL; Refill: 0 - Semaglutide-Weight Management 0.5 MG/0.5ML SOAJ; Inject 0.5 mg into the skin once a week.  Dispense: 6 mL; Refill: 0  8. Cold intolerance  - TSH - CBC with Differential/Platelet

## 2021-08-25 ENCOUNTER — Encounter: Payer: Self-pay | Admitting: Family Medicine

## 2021-08-25 ENCOUNTER — Ambulatory Visit (INDEPENDENT_AMBULATORY_CARE_PROVIDER_SITE_OTHER): Payer: No Typology Code available for payment source | Admitting: Family Medicine

## 2021-08-25 ENCOUNTER — Ambulatory Visit: Payer: No Typology Code available for payment source | Admitting: Family Medicine

## 2021-08-25 ENCOUNTER — Other Ambulatory Visit: Payer: Self-pay

## 2021-08-25 VITALS — BP 116/78 | HR 94 | Resp 16 | Ht 59.0 in | Wt 173.0 lb

## 2021-08-25 DIAGNOSIS — E8881 Metabolic syndrome: Secondary | ICD-10-CM

## 2021-08-25 DIAGNOSIS — R6889 Other general symptoms and signs: Secondary | ICD-10-CM

## 2021-08-25 DIAGNOSIS — F39 Unspecified mood [affective] disorder: Secondary | ICD-10-CM | POA: Diagnosis not present

## 2021-08-25 DIAGNOSIS — E669 Obesity, unspecified: Secondary | ICD-10-CM

## 2021-08-25 DIAGNOSIS — G43009 Migraine without aura, not intractable, without status migrainosus: Secondary | ICD-10-CM

## 2021-08-25 DIAGNOSIS — E538 Deficiency of other specified B group vitamins: Secondary | ICD-10-CM | POA: Diagnosis not present

## 2021-08-25 DIAGNOSIS — G5711 Meralgia paresthetica, right lower limb: Secondary | ICD-10-CM

## 2021-08-25 DIAGNOSIS — J4530 Mild persistent asthma, uncomplicated: Secondary | ICD-10-CM

## 2021-08-25 MED ORDER — EMGALITY 120 MG/ML ~~LOC~~ SOAJ
1.0000 mL | SUBCUTANEOUS | 5 refills | Status: DC
  Filled 2021-08-25: qty 1, 30d supply, fill #0
  Filled 2021-08-28 – 2021-08-31 (×2): qty 1, 28d supply, fill #0
  Filled 2021-10-11 (×2): qty 1, 28d supply, fill #1
  Filled 2021-11-03: qty 1, 28d supply, fill #2
  Filled 2021-12-04 – 2021-12-26 (×2): qty 1, 28d supply, fill #3
  Filled 2022-02-09 (×2): qty 1, 28d supply, fill #4
  Filled 2022-03-14: qty 1, 28d supply, fill #5

## 2021-08-25 MED ORDER — SEMAGLUTIDE-WEIGHT MANAGEMENT 0.5 MG/0.5ML ~~LOC~~ SOAJ
0.5000 mg | SUBCUTANEOUS | 0 refills | Status: DC
  Filled 2021-08-25 – 2021-10-10 (×2): qty 6, fill #0
  Filled 2021-10-10: qty 2, 28d supply, fill #0
  Filled 2021-11-03 – 2021-11-24 (×2): qty 2, 28d supply, fill #1

## 2021-08-25 MED ORDER — SEMAGLUTIDE-WEIGHT MANAGEMENT 0.25 MG/0.5ML ~~LOC~~ SOAJ
0.2500 mg | SUBCUTANEOUS | 0 refills | Status: AC
  Filled 2021-08-25: qty 2, 28d supply, fill #0

## 2021-08-25 MED ORDER — CYANOCOBALAMIN 1000 MCG/ML IJ SOLN
1000.0000 ug | Freq: Once | INTRAMUSCULAR | Status: AC
  Administered 2021-08-25: 1000 ug via INTRAMUSCULAR

## 2021-08-25 MED ORDER — UBRELVY 100 MG PO TABS
1.0000 | ORAL_TABLET | Freq: Every day | ORAL | 0 refills | Status: DC | PRN
  Filled 2021-08-25: qty 16, 32d supply, fill #0

## 2021-08-26 LAB — CBC WITH DIFFERENTIAL/PLATELET
Absolute Monocytes: 477 cells/uL (ref 200–950)
Basophils Absolute: 54 cells/uL (ref 0–200)
Basophils Relative: 0.6 %
Eosinophils Absolute: 639 cells/uL — ABNORMAL HIGH (ref 15–500)
Eosinophils Relative: 7.1 %
HCT: 38.8 % (ref 35.0–45.0)
Hemoglobin: 12.8 g/dL (ref 11.7–15.5)
Lymphs Abs: 1971 cells/uL (ref 850–3900)
MCH: 28.5 pg (ref 27.0–33.0)
MCHC: 33 g/dL (ref 32.0–36.0)
MCV: 86.4 fL (ref 80.0–100.0)
MPV: 12 fL (ref 7.5–12.5)
Monocytes Relative: 5.3 %
Neutro Abs: 5859 cells/uL (ref 1500–7800)
Neutrophils Relative %: 65.1 %
Platelets: 244 10*3/uL (ref 140–400)
RBC: 4.49 10*6/uL (ref 3.80–5.10)
RDW: 13.3 % (ref 11.0–15.0)
Total Lymphocyte: 21.9 %
WBC: 9 10*3/uL (ref 3.8–10.8)

## 2021-08-26 LAB — TSH: TSH: 0.91 mIU/L

## 2021-08-28 ENCOUNTER — Other Ambulatory Visit: Payer: Self-pay

## 2021-08-29 ENCOUNTER — Telehealth: Payer: Self-pay

## 2021-08-29 ENCOUNTER — Encounter: Payer: Self-pay | Admitting: Family Medicine

## 2021-08-29 NOTE — Telephone Encounter (Signed)
Copied from [place]. Topic: Medical Record Request - Other >> Aug 29, 2021  9:54 AM Chapman Fitch wrote: Reason for CRM: [facility] needs OV notes from 4.28.23 and 5.12.23 and 7.28.23/ please advise if the request was recived / shawna asked if they can just be faxed to her [phone]/ please advise    08/29/21 10:18am: OV notes from 4.28.23 and 7.28.23 faxed to [facility] at [phone]. We do not have any OV notes from 06/09/21, it was just a nurse visit for a B12 shot.

## 2021-08-29 NOTE — Telephone Encounter (Signed)
Copied from [place]. Topic: Medical Record Request - Other >> Aug 29, 2021  9:54 AM Chapman Fitch wrote: Reason for CRM: [facility] needs OV notes from 4.28.23 and 5.12.23 and 7.28.23/ please advise if the request was recived / shawna asked if they can just be faxed to her [phone] please advise

## 2021-08-31 ENCOUNTER — Other Ambulatory Visit: Payer: Self-pay

## 2021-08-31 ENCOUNTER — Ambulatory Visit: Payer: No Typology Code available for payment source | Admitting: Family Medicine

## 2021-08-31 ENCOUNTER — Encounter: Payer: Self-pay | Admitting: Family Medicine

## 2021-09-01 ENCOUNTER — Other Ambulatory Visit: Payer: Self-pay

## 2021-09-12 ENCOUNTER — Other Ambulatory Visit: Payer: Self-pay

## 2021-09-12 MED ORDER — GABAPENTIN 300 MG PO CAPS
ORAL_CAPSULE | ORAL | 3 refills | Status: DC
  Filled 2021-09-12: qty 180, 60d supply, fill #0
  Filled 2021-11-24: qty 180, 60d supply, fill #1
  Filled 2022-02-09 (×2): qty 180, 60d supply, fill #2
  Filled 2022-04-11: qty 180, 60d supply, fill #3

## 2021-09-22 ENCOUNTER — Ambulatory Visit: Payer: No Typology Code available for payment source | Admitting: Family Medicine

## 2021-09-29 ENCOUNTER — Other Ambulatory Visit: Payer: Self-pay

## 2021-09-29 ENCOUNTER — Ambulatory Visit
Admission: RE | Admit: 2021-09-29 | Discharge: 2021-09-29 | Disposition: A | Discharge status: Home / Self Care | Payer: No Typology Code available for payment source | Source: Ambulatory Visit | Attending: Family Medicine | Admitting: Family Medicine

## 2021-09-29 ENCOUNTER — Encounter: Payer: Self-pay | Admitting: Family Medicine

## 2021-09-29 ENCOUNTER — Ambulatory Visit (INDEPENDENT_AMBULATORY_CARE_PROVIDER_SITE_OTHER): Payer: No Typology Code available for payment source | Admitting: Family Medicine

## 2021-09-29 VITALS — BP 120/88 | HR 88 | Ht 59.0 in | Wt 171.6 lb

## 2021-09-29 DIAGNOSIS — Z1231 Encounter for screening mammogram for malignant neoplasm of breast: Secondary | ICD-10-CM | POA: Insufficient documentation

## 2021-09-29 DIAGNOSIS — M7521 Bicipital tendinitis, right shoulder: Secondary | ICD-10-CM | POA: Diagnosis not present

## 2021-09-29 DIAGNOSIS — M7541 Impingement syndrome of right shoulder: Secondary | ICD-10-CM | POA: Diagnosis not present

## 2021-09-29 MED ORDER — MELOXICAM 15 MG PO TABS
15.0000 mg | ORAL_TABLET | Freq: Every day | ORAL | 0 refills | Status: DC
  Filled 2021-09-29: qty 30, 30d supply, fill #0

## 2021-09-29 NOTE — Progress Notes (Signed)
     Primary Care / Sports Medicine Office Visit  Patient Information:  Patient ID: Shelby Henson, female DOB: May 15, 1970 Age: 51 y.o. MRN: [id number]   DEZIYAH Henson is a pleasant 51 y.o. female presenting with the following:  Chief Complaint  Patient presents with   Shoulder Pain    Right    Vitals:   09/29/21 1019  BP: 120/88  Pulse: 88  SpO2: 98%   Vitals:   09/29/21 1019  Weight: 171 lb 9.3 oz (77.8 kg)  Height: '4\' 11"'$  (1.499 m)   Body mass index is 34.65 kg/m.  No results found.   Independent interpretation of notes and tests performed by another provider:   None  Procedures performed:   None  Pertinent History, Exam, Impression, and Recommendations:   Problem List Items Addressed This Visit       Musculoskeletal and Integument   Rotator cuff impingement syndrome, right - Primary    See additional assessment(s) for plan details.      Relevant Medications   meloxicam (MOBIC) 15 MG tablet   Biceps tendinitis, right    Right-hand-dominant patient with roughly 6-55-monthhistory of atraumatic right anterolateral shoulder pain, has been highly active in the gym over the past year but denies any change in the workouts that coincided with symptom onset.  Pain does not radiate to the neck or being under the elbow, aggravated with reaching, overhead, behind the back activities, symptom control with relative rest and OTC medications with some benefit.  Examination shows full painful overhead range of motion, additionally painful with maximal symmetric external rotation and internal rotation/extension to the mid back, again symmetric.  Rotator cuff testing reveals 5/5 painful strength with external rotation, internal rotation, 5 -/5 maximally painful resisted supraspinatus testing, positive Neer's, positive Hawkins, equivocal speeds, positive Yergason's, positive O'Brien's, she has tenderness at the bicipital groove.  Clinical history and features are most consistent  with supraspinatus related impingement and biceps tendinitis, cannot exclude an element of compensatory features with these 2 findings.  Discussed the same with the patient as well as treatment strategies, will trial 2-week meloxicam course followed by as needed dosing, home-based shoulder conditioning program, and close 6-week follow-up.  If suboptimal response noted at return, can consider subacromial and/or biceps tendon sheath cortisone injections.      Relevant Medications   meloxicam (MOBIC) 15 MG tablet   I provided a total time of 34 minutes including both face-to-face and non-face-to-face time on 09/29/2021 inclusive of time utilized for medical chart review, information gathering, care coordination with staff, and documentation completion.    Orders & Medications Meds ordered this encounter  Medications   meloxicam (MOBIC) 15 MG tablet    Sig: Take 1 tablet (15 mg total) by mouth daily.    Dispense:  30 tablet    Refill:  0   No orders of the defined types were placed in this encounter.    Return in about 6 weeks (around 11/10/2021).     JMontel Culver MD   Primary Care Sports Medicine [facility]

## 2021-09-29 NOTE — Assessment & Plan Note (Signed)
See additional assessment(s) for plan details. 

## 2021-09-29 NOTE — Patient Instructions (Signed)
-   Start meloxicam, take once daily with largest meal x2 weeks - After 2 weeks dose once daily on as-needed basis - Hold from your usual upper body workout routine - Start home exercises with information provided x6 weeks - Return for follow-up in 6 weeks

## 2021-09-29 NOTE — Assessment & Plan Note (Signed)
Right-hand-dominant patient with roughly 6-87-monthhistory of atraumatic right anterolateral shoulder pain, has been highly active in the gym over the past year but denies any change in the workouts that coincided with symptom onset.  Pain does not radiate to the neck or being under the elbow, aggravated with reaching, overhead, behind the back activities, symptom control with relative rest and OTC medications with some benefit.  Examination shows full painful overhead range of motion, additionally painful with maximal symmetric external rotation and internal rotation/extension to the mid back, again symmetric.  Rotator cuff testing reveals 5/5 painful strength with external rotation, internal rotation, 5 -/5 maximally painful resisted supraspinatus testing, positive Neer's, positive Hawkins, equivocal speeds, positive Yergason's, positive O'Brien's, she has tenderness at the bicipital groove.  Clinical history and features are most consistent with supraspinatus related impingement and biceps tendinitis, cannot exclude an element of compensatory features with these 2 findings.  Discussed the same with the patient as well as treatment strategies, will trial 2-week meloxicam course followed by as needed dosing, home-based shoulder conditioning program, and close 6-week follow-up.  If suboptimal response noted at return, can consider subacromial and/or biceps tendon sheath cortisone injections.

## 2021-10-03 NOTE — Progress Notes (Signed)
PCP: Steele Sizer, MD   Chief Complaint  Patient presents with   Gynecologic Exam    No concerns    HPI:      [name] is a 51 y.o. [id number] whose LMP was No LMP recorded. Patient has had a hysterectomy., presents today for her annual examination.  Her menses are absent due to supracx hyst for AUB. No PMB. She does not have vasomotor sx. Stopped clonidine a few months ago and feels fine. Also already on gabapentin and SSRI. Did ERT in past (feels she has weight gain side effects).   Sex activity: single partner, contraception - status post hysterectomy. She does not have vaginal dryness.  Last Pap: 08/04/18 Results were: no abnormalities /neg HPV DNA. Still has cx. Hx of LEEP 2005 with benign pathology.    Last mammogram: 09/29/21  Results were: normal--routine follow-up in 12 months There is no FH of breast cancer. There is no FH of ovarian cancer. The patient does do self-breast exams. Hx of FNA LT breast in past with fibrocystic changes.   Colonoscopy: 09/22/20 with [facility] with polyps; Repeat due after 7 years.   Tobacco use: The patient denies current or previous tobacco use. Alcohol use: social drinker No drug use Exercise: very active  She does get adequate calcium and Vitamin D in her diet.  Labs with PCP.   Patient Active Problem List   Diagnosis Date Noted   Rotator cuff impingement syndrome, right 09/29/2021   Biceps tendinitis, right 09/29/2021   Meralgia paresthetica of right side 03/29/2021   Psoas tendinitis, right hip 03/29/2021   History of total right hip arthroplasty 03/29/2021   Status post total replacement of right hip 02/19/2021   Meralgia paresthetica of right side 02/19/2021   Polyp of ascending colon 02/19/2021   Polyp of sigmoid colon 02/19/2021   Special screening for malignant neoplasms, colon    Polyp of ascending colon    Polyp of sigmoid colon    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 03/23/2020    Pain in right hip 01/12/2019   Vitamin D deficiency 02/25/2018   Chronic pain 10/24/2015   Hot flashes 10/24/2015   Migraine without aura and without status migrainosus, not intractable 84/53/6468   Metabolic syndrome 04/19/2246   Dyslipidemia 10/24/2015   Allergic rhinitis, seasonal 05/17/2015   Asthma, mild intermittent 08/08/2014   Chronic insomnia 08/08/2014   DDD (degenerative disc disease), lumbar 08/08/2014   Mood disorder ([place]) 08/08/2014   Gastro-esophageal reflux disease without esophagitis 08/08/2014   H/O total hysterectomy 08/08/2014   Irritable bowel syndrome with diarrhea 08/08/2014   Osteoarthritis of right hip 08/08/2014   Adult BMI 30+ 08/08/2014   SBO (spina bifida occulta) 08/08/2014    Past Surgical History:  Procedure Laterality Date   ABLATION     BREAST CYST ASPIRATION Left    CESAREAN SECTION     CESAREAN SECTION     CESAREAN SECTION     COLONOSCOPY N/A 09/22/2020   Procedure: COLONOSCOPY;  Surgeon: Virgel Manifold, MD;  Location: [facility] END[facility]COPY;  Service: Endoscopy;  Laterality: N/A;   HIP SURGERY  10/2018   LAPAROSCOPY  2018   LEEP  2005   benign pathology; Dr. Laurey Morale   SUPRACERVICAL ABDOMINAL HYSTERECTOMY  2010   leio/AUB; Dr. Laurey Morale   TOTAL HIP ARTHROPLASTY Right 11/12/2018   Procedure: TOTAL HIP ARTHROPLASTY ANTERIOR APPROACH;  Surgeon: Lovell Sheehan, MD;  Location: [facility] ORS;  Service: Orthopedics;  Laterality:  Right;    Family History  Problem Relation Age of Onset   Depression Mother    Bipolar disorder Brother    Colon cancer Maternal Grandfather        not sure of age   Breast cancer Neg Hx     Social History   Socioeconomic History   Marital status: Divorced    Spouse name: Not on file   Number of children: 3   Years of education: Not on file   Highest education level: Not on file  Occupational History   Not on file  Tobacco Use   Smoking status: Never   Smokeless tobacco: Never  Vaping Use   Vaping Use: Never  used  Substance and Sexual Activity   Alcohol use: Yes    Alcohol/week: 0.0 standard drinks of alcohol    Comment: occasionally   Drug use: No   Sexual activity: Yes    Birth control/protection: Surgical    Comment: Hysterectomy  Other Topics Concern   Not on file  Social History Narrative   Not on file   Social Determinants of Health   Financial Resource Strain: Low Risk  (06/02/2021)   Overall Financial Resource Strain (CARDIA)    Difficulty of Paying Living Expenses: Not hard at all  Food Insecurity: No Food Insecurity (06/02/2021)   Hunger Vital Sign    Worried About Running Out of Food in the Last Year: Never true    Ran Out of Food in the Last Year: Never true  Transportation Needs: No Transportation Needs (06/02/2021)   PRAPARE - Hydrologist (Medical): No    Lack of Transportation (Non-Medical): No  Physical Activity: Inactive (02/25/2018)   Exercise Vital Sign    Days of Exercise per Week: 0 days    Minutes of Exercise per Session: 0 min  Stress: Stress Concern Present (09/03/2017)   [place]    Feeling of Stress : Very much  Social Connections: Moderately Isolated (09/03/2017)   Social Connection and Isolation Panel [NHANES]    Frequency of Communication with Friends and Family: More than three times a week    Frequency of Social Gatherings with Friends and Family: More than three times a week    Attends Religious Services: Never    Marine scientist or Organizations: No    Attends Archivist Meetings: Never    Marital Status: Divorced  Human resources officer Violence: Not At Risk (06/02/2021)   Humiliation, Afraid, Rape, and Kick questionnaire    Fear of Current or Ex-Partner: No    Emotionally Abused: No    Physically Abused: No    Sexually Abused: No     Current Outpatient Medications:    albuterol (PROVENTIL) (2.5 MG/3ML) 0.083% nebulizer solution, USE ONE VIAL  VIA NEBULIZER EVERY 6 HOURS AS NEEDED FOR WHEEZING OR SHORTNESS OF BREATH., Disp: 75 mL, Rfl: 2   budesonide (PULMICORT) 0.5 MG/2ML nebulizer solution, TAKE 2 MLS BY NEBULIZATION 2 TIMES DAILY., Disp: 60 mL, Rfl: 5   desvenlafaxine (PRISTIQ) 50 MG 24 hr tablet, TAKE 1 TABLET BY MOUTH DAILY., Disp: 90 tablet, Rfl: 1   diclofenac Sodium (VOLTAREN) 1 % GEL, Apply 2 g topically 4 (four) times daily., Disp: 100 g, Rfl: 1   gabapentin (NEURONTIN) 300 MG capsule, Take 3 capsules (900 mg total) by mouth at bedtime, Disp: 180 capsule, Rfl: 3   Galcanezumab-gnlm (EMGALITY) 120 MG/ML SOAJ, Inject 1 mL into the  skin every 30 (thirty) days., Disp: 1 mL, Rfl: 5   meloxicam (MOBIC) 15 MG tablet, Take 1 tablet (15 mg total) by mouth daily., Disp: 30 tablet, Rfl: 0   Semaglutide-Weight Management 0.5 MG/0.5ML SOAJ, Inject 0.5 mg into the skin once a week., Disp: 6 mL, Rfl: 0   Ubrogepant (UBRELVY) 100 MG TABS, Take 1 tablet by mouth daily as needed. Migraines , for breakthrough only, Disp: 16 tablet, Rfl: 0     ROS:  Review of Systems  Constitutional:  Negative for fatigue, fever and unexpected weight change.  Respiratory:  Negative for cough, shortness of breath and wheezing.   Cardiovascular:  Negative for chest pain, palpitations and leg swelling.  Gastrointestinal:  Negative for blood in stool, constipation, diarrhea, nausea and vomiting.  Endocrine: Negative for cold intolerance, heat intolerance and polyuria.  Genitourinary:  Negative for dyspareunia, dysuria, flank pain, frequency, genital sores, hematuria, menstrual problem, pelvic pain, urgency, vaginal bleeding, vaginal discharge and vaginal pain.  Musculoskeletal:  Positive for arthralgias. Negative for back pain, joint swelling and myalgias.  Skin:  Negative for rash.  Neurological:  Positive for headaches. Negative for dizziness, syncope, light-headedness and numbness.  Hematological:  Negative for adenopathy.  Psychiatric/Behavioral:  Negative  for agitation, confusion, sleep disturbance and suicidal ideas. The patient is not nervous/anxious.    BREAST: No symptoms    Objective: BP 120/70   Ht '4\' 11"'$  (1.499 m)   Wt 172 lb (78 kg)   BMI 34.74 kg/m    Physical Exam Constitutional:      Appearance: She is well-developed.  Genitourinary:     Vulva normal.     Genitourinary Comments: UTERUS SURG REM     Right Labia: No rash, tenderness or lesions.    Left Labia: No tenderness, lesions or rash.    Vaginal cuff intact.    No vaginal discharge, erythema or tenderness.      Right Adnexa: not tender and no mass present.    Left Adnexa: not tender and no mass present.    No cervical lesion or polyp.     Uterus is absent.  Breasts:    Right: No mass, nipple discharge, skin change or tenderness.     Left: No mass, nipple discharge, skin change or tenderness.  Neck:     Thyroid: No thyromegaly.  Cardiovascular:     Rate and Rhythm: Normal rate and regular rhythm.     Heart sounds: Normal heart sounds. No murmur heard. Pulmonary:     Effort: Pulmonary effort is normal.     Breath sounds: Normal breath sounds.  Abdominal:     Palpations: Abdomen is soft.     Tenderness: There is no abdominal tenderness. There is no guarding.  Musculoskeletal:        General: Normal range of motion.     Cervical back: Normal range of motion.  Neurological:     General: No focal deficit present.     Mental Status: She is alert and oriented to person, place, and time.     Cranial Nerves: No cranial nerve deficit.  Skin:    General: Skin is warm and dry.  Psychiatric:        Mood and Affect: Mood normal.        Behavior: Behavior normal.        Thought Content: Thought content normal.        Judgment: Judgment normal.  Vitals reviewed.     Assessment/Plan:  Encounter for annual routine gynecological  examination  Encounter for screening mammogram for malignant neoplasm of breast; pt current on mammo  Vasomotor flushing--sx  resolved. F/u prn.          GYN counsel breast self exam, mammography screening, menopause, adequate intake of calcium and vitamin D, diet and exercise    F/U  Return in about 1 year (around 10/06/2022).  Maecy Podgurski B. Cachet Mccutchen, PA-C 10/05/2021 9:29 AM

## 2021-10-05 ENCOUNTER — Ambulatory Visit (INDEPENDENT_AMBULATORY_CARE_PROVIDER_SITE_OTHER): Payer: No Typology Code available for payment source | Admitting: Obstetrics and Gynecology

## 2021-10-05 ENCOUNTER — Encounter: Payer: Self-pay | Admitting: Obstetrics and Gynecology

## 2021-10-05 VITALS — BP 120/70 | Ht 59.0 in | Wt 172.0 lb

## 2021-10-05 DIAGNOSIS — Z01419 Encounter for gynecological examination (general) (routine) without abnormal findings: Secondary | ICD-10-CM

## 2021-10-05 DIAGNOSIS — Z124 Encounter for screening for malignant neoplasm of cervix: Secondary | ICD-10-CM

## 2021-10-05 DIAGNOSIS — Z1151 Encounter for screening for human papillomavirus (HPV): Secondary | ICD-10-CM

## 2021-10-05 DIAGNOSIS — R232 Flushing: Secondary | ICD-10-CM

## 2021-10-05 DIAGNOSIS — Z1231 Encounter for screening mammogram for malignant neoplasm of breast: Secondary | ICD-10-CM | POA: Diagnosis not present

## 2021-10-05 NOTE — Patient Instructions (Signed)
I value your feedback and you entrusting us with your care. If you get a [facility] patient survey, I would appreciate you taking the time to let us know about your experience today. Thank you! ? ? ?

## 2021-10-10 ENCOUNTER — Other Ambulatory Visit: Payer: Self-pay

## 2021-10-11 ENCOUNTER — Other Ambulatory Visit: Payer: Self-pay

## 2021-10-26 ENCOUNTER — Encounter: Payer: Self-pay | Admitting: Family Medicine

## 2021-11-03 ENCOUNTER — Ambulatory Visit (INDEPENDENT_AMBULATORY_CARE_PROVIDER_SITE_OTHER): Payer: No Typology Code available for payment source | Admitting: Family Medicine

## 2021-11-03 ENCOUNTER — Other Ambulatory Visit: Payer: Self-pay

## 2021-11-03 ENCOUNTER — Encounter: Payer: Self-pay | Admitting: Family Medicine

## 2021-11-03 ENCOUNTER — Other Ambulatory Visit: Payer: Self-pay | Admitting: Family Medicine

## 2021-11-03 VITALS — BP 120/70 | HR 93 | Resp 16 | Ht 59.0 in | Wt 172.0 lb

## 2021-11-03 DIAGNOSIS — M25531 Pain in right wrist: Secondary | ICD-10-CM

## 2021-11-03 DIAGNOSIS — M545 Low back pain, unspecified: Secondary | ICD-10-CM

## 2021-11-03 MED ORDER — METHOCARBAMOL 500 MG PO TABS
500.0000 mg | ORAL_TABLET | Freq: Four times a day (QID) | ORAL | 0 refills | Status: DC
  Filled 2021-11-03: qty 90, 23d supply, fill #0

## 2021-11-03 MED ORDER — LIDOCAINE 5 % EX PTCH
2.0000 | MEDICATED_PATCH | CUTANEOUS | 0 refills | Status: DC
  Filled 2021-11-03: qty 60, 30d supply, fill #0

## 2021-11-03 MED ORDER — METAXALONE 800 MG PO TABS
800.0000 mg | ORAL_TABLET | Freq: Three times a day (TID) | ORAL | 0 refills | Status: DC | PRN
  Filled 2021-11-03: qty 90, 30d supply, fill #0

## 2021-11-03 NOTE — Patient Instructions (Signed)

## 2021-11-03 NOTE — Progress Notes (Signed)
Name: Shelby Henson   MRN: [id number]    DOB: April 08, 1970   Date:11/03/2021       Progress Note  Subjective  Chief Complaint  Back Pain  HPI  Acute back pain: she states three weeks ago , while at work , she noticed a dull/aching pain on middle of lower back that radiates to paraspinal muscles towards thoracic spine. Pain ranges from 3-7 , constant, worse when sitting, not affecting her sleep. She works at [employer] and has been getting needling and also improves with massage. She states ice also helps with pain.   Denies bowel or bladder incontinence of symptoms of radiculitis, no rashes. Denies urinary symptoms , fever or chills.   She already takes gabapentin and meloxicam from tendinitis and chronic hip pain .    Patient Active Problem List   Diagnosis Date Noted   Rotator cuff impingement syndrome, right 09/29/2021   Biceps tendinitis, right 09/29/2021   Meralgia paresthetica of right side 03/29/2021   Psoas tendinitis, right hip 03/29/2021   History of total right hip arthroplasty 03/29/2021   Status post total replacement of right hip 02/19/2021   Meralgia paresthetica of right side 02/19/2021   Polyp of ascending colon 02/19/2021   Polyp of sigmoid colon 02/19/2021   Special screening for malignant neoplasms, colon    Polyp of ascending colon    Polyp of sigmoid colon    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 03/23/2020   Pain in right hip 01/12/2019   Vitamin D deficiency 02/25/2018   Chronic pain 10/24/2015   Hot flashes 10/24/2015   Migraine without aura and without status migrainosus, not intractable 58/85/0277   Metabolic syndrome 41/28/7867   Dyslipidemia 10/24/2015   Allergic rhinitis, seasonal 05/17/2015   Asthma, mild intermittent 08/08/2014   Chronic insomnia 08/08/2014   DDD (degenerative disc disease), lumbar 08/08/2014   Mood disorder ([place]) 08/08/2014   Gastro-esophageal reflux disease without esophagitis 08/08/2014   H/O  total hysterectomy 08/08/2014   Irritable bowel syndrome with diarrhea 08/08/2014   Osteoarthritis of right hip 08/08/2014   Adult BMI 30+ 08/08/2014   SBO (spina bifida occulta) 08/08/2014    Past Surgical History:  Procedure Laterality Date   ABLATION     BREAST CYST ASPIRATION Left    CESAREAN SECTION     CESAREAN SECTION     CESAREAN SECTION     COLONOSCOPY N/A 09/22/2020   Procedure: COLONOSCOPY;  Surgeon: Virgel Manifold, MD;  Location: [facility];  Service: Endoscopy;  Laterality: N/A;   HIP SURGERY  10/2018   LAPAROSCOPY  2018   LEEP  2005   benign pathology; Dr. Laurey Morale   SUPRACERVICAL ABDOMINAL HYSTERECTOMY  2010   leio/AUB; Dr. Laurey Morale   TOTAL HIP ARTHROPLASTY Right 11/12/2018   Procedure: TOTAL HIP ARTHROPLASTY ANTERIOR APPROACH;  Surgeon: Lovell Sheehan, MD;  Location: [facility] OR[facility];  Service: Orthopedics;  Laterality: Right;    Family History  Problem Relation Age of Onset   Depression Mother    Bipolar disorder Brother    Colon cancer Maternal Grandfather        not sure of age   Breast cancer Neg Hx     Social History   Tobacco Use   Smoking status: Never   Smokeless tobacco: Never  Substance Use Topics   Alcohol use: Yes    Alcohol/week: 0.0 standard drinks of alcohol    Comment: occasionally     Current Outpatient Medications:  albuterol (PROVENTIL) (2.5 MG/3ML) 0.083% nebulizer solution, USE ONE VIAL VIA NEBULIZER EVERY 6 HOURS AS NEEDED FOR WHEEZING OR SHORTNESS OF BREATH., Disp: 75 mL, Rfl: 2   budesonide (PULMICORT) 0.5 MG/2ML nebulizer solution, TAKE 2 MLS BY NEBULIZATION 2 TIMES DAILY., Disp: 60 mL, Rfl: 5   desvenlafaxine (PRISTIQ) 50 MG 24 hr tablet, TAKE 1 TABLET BY MOUTH DAILY., Disp: 90 tablet, Rfl: 1   diclofenac Sodium (VOLTAREN) 1 % GEL, Apply 2 g topically 4 (four) times daily., Disp: 100 g, Rfl: 1   gabapentin (NEURONTIN) 300 MG capsule, Take 3 capsules (900 mg total) by mouth at bedtime, Disp: 180 capsule, Rfl: 3    Galcanezumab-gnlm (EMGALITY) 120 MG/ML SOAJ, Inject 1 mL into the skin every 30 (thirty) days., Disp: 1 mL, Rfl: 5   meloxicam (MOBIC) 15 MG tablet, Take 1 tablet (15 mg total) by mouth daily., Disp: 30 tablet, Rfl: 0   Semaglutide-Weight Management 0.5 MG/0.5ML SOAJ, Inject 0.5 mg into the skin once a week., Disp: 6 mL, Rfl: 0   Ubrogepant (UBRELVY) 100 MG TABS, Take 1 tablet by mouth daily as needed. Migraines , for breakthrough only, Disp: 16 tablet, Rfl: 0  Allergies  Allergen Reactions   Celecoxib Hives   Prednisone     intolerance, makes her angry. Makes her angry   Topamax [Topiramate]     Mood changes   Zolpidem    Codeine Rash   Penicillins Rash    Did it involve swelling of the face/tongue/throat, SOB, or low BP? No Did it involve sudden or severe rash/hives, skin peeling, or any reaction on the inside of your mouth or nose? Yes Did you need to seek medical attention at a hospital or doctor's office? Yes When did it last happen? More than 20 years ago       If all above answers are "NO", may proceed with cephalosporin use.     I personally reviewed active problem list, medication list, allergies, family history, social history, health maintenance with the patient/caregiver today.   ROS  Constitutional: Negative for fever or weight change.  Respiratory: Negative for cough and shortness of breath.   Cardiovascular: Negative for chest pain or palpitations.  Gastrointestinal: Negative for abdominal pain, no bowel changes.  Musculoskeletal: Negative for gait problem or joint swelling.  Skin: Negative for rash.  Neurological: Negative for dizziness or headache.  No other specific complaints in a complete review of systems (except as listed in HPI above).   Objective  Vitals:   11/03/21 1046  BP: 120/70  Pulse: 93  Resp: 16  SpO2: 95%  Weight: 172 lb (78 kg)  Height: '4\' 11"'$  (1.499 m)    Body mass index is 34.74 kg/m.  Physical Exam  Constitutional: Patient  appears well-developed and well-nourished. Obese  No distress.  HEENT: head atraumatic, normocephalic, pupils equal and reactive to light, neck supple, throat within normal limits Cardiovascular: Normal rate, regular rhythm and normal heart sounds.  No murmur heard. No BLE edema. Pulmonary/Chest: Effort normal and breath sounds normal. No respiratory distress. Abdominal: Soft.  There is no tenderness. Muscular skeletal: normal rom of lumbar spine, tender during palpation of para spinal muscles lumbar and lower thoracic spine, negative straight leg raise  Psychiatric: Patient has a normal mood and affect. behavior is normal. Judgment and thought content normal.   Recent Results (from the past 2160 hour(s))  TSH     Status: None   Collection Time: 08/25/21 10:15 AM  Result Value Ref Range  TSH 0.91 mIU/L    Comment:           Reference Range .           > or = 20 Years  0.40-4.50 .                Pregnancy Ranges           First trimester    0.26-2.66           Second trimester   0.55-2.73           Third trimester    0.43-2.91   CBC with Differential/Platelet     Status: Abnormal   Collection Time: 08/25/21 10:15 AM  Result Value Ref Range   WBC 9.0 3.8 - 10.8 Thousand/uL   RBC 4.49 3.80 - 5.10 Million/uL   Hemoglobin 12.8 11.7 - 15.5 g/dL   HCT 38.8 35.0 - 45.0 %   MCV 86.4 80.0 - 100.0 fL   MCH 28.5 27.0 - 33.0 pg   MCHC 33.0 32.0 - 36.0 g/dL   RDW 13.3 11.0 - 15.0 %   Platelets 244 140 - 400 Thousand/uL   MPV 12.0 7.5 - 12.5 fL   Neutro Abs 5,859 1,500 - 7,800 cells/uL   Lymphs Abs 1,971 850 - 3,900 cells/uL   Absolute Monocytes 477 200 - 950 cells/uL   Eosinophils Absolute 639 (H) 15 - 500 cells/uL   Basophils Absolute 54 0 - 200 cells/uL   Neutrophils Relative % 65.1 %   Total Lymphocyte 21.9 %   Monocytes Relative 5.3 %   Eosinophils Relative 7.1 %   Basophils Relative 0.6 %    PHQ2/9:    11/03/2021   10:46 AM 08/25/2021    9:34 AM 08/03/2021    2:16 PM 06/02/2021     2:35 PM 05/26/2021    8:54 AM  Depression screen PHQ 2/9  Decreased Interest 0 0 0 0 0  Down, Depressed, Hopeless 0 0 0 0 0  PHQ - 2 Score 0 0 0 0 0  Altered sleeping '3 3 3 3 3  '$ Tired, decreased energy 0 0 1 0 0  Change in appetite 0 3 0 0 0  Feeling bad or failure about yourself  0 0 0 0 0  Trouble concentrating 1 0 '1 1 1  '$ Moving slowly or fidgety/restless 0 0 0 0 0  Suicidal thoughts 0 0 0 0 0  PHQ-9 Score '4 6 5 4 4  '$ Difficult doing work/chores   Not difficult at all Not difficult at all Not difficult at all    phq 9 is negative   Fall Risk:    11/03/2021   10:46 AM 08/25/2021    9:29 AM 08/03/2021    2:16 PM 06/02/2021    2:36 PM 05/26/2021    8:54 AM  Fall Risk   Falls in the past year? 0 0 0 0 0  Number falls in past yr: 0 0  0   Injury with Fall? 0 0 0 0   Risk for fall due to : No Fall Risks No Fall Risks  No Fall Risks No Fall Risks  Follow up Falls prevention discussed Falls prevention discussed  Falls evaluation completed Falls prevention discussed      Functional Status Survey: Is the patient deaf or have difficulty hearing?: No Does the patient have difficulty seeing, even when wearing glasses/contacts?: No Does the patient have difficulty concentrating, remembering, or making decisions?: No Does the patient have difficulty  walking or climbing stairs?: Yes Does the patient have difficulty dressing or bathing?: No Does the patient have difficulty doing errands alone such as visiting a doctor's office or shopping?: No    Assessment & Plan  1. Acute midline low back pain without sciatica  - lidocaine (LIDODERM) 5 %; Place 2 patches onto the skin daily. Remove & Discard patch within 12 hours or as directed by MD  Dispense: 60 patch; Refill: 0 - metaxalone (SKELAXIN) 800 MG tablet; Take 1 tablet (800 mg total) by mouth 3 (three) times daily as needed for muscle spasms.  Dispense: 90 tablet; Refill: 0

## 2021-11-05 ENCOUNTER — Other Ambulatory Visit: Payer: Self-pay

## 2021-11-06 ENCOUNTER — Other Ambulatory Visit: Payer: Self-pay

## 2021-11-07 ENCOUNTER — Other Ambulatory Visit: Payer: Self-pay

## 2021-11-07 ENCOUNTER — Ambulatory Visit: Payer: No Typology Code available for payment source | Admitting: Family Medicine

## 2021-11-07 ENCOUNTER — Other Ambulatory Visit: Payer: Self-pay | Admitting: Family Medicine

## 2021-11-07 DIAGNOSIS — M25532 Pain in left wrist: Secondary | ICD-10-CM

## 2021-11-07 MED FILL — Diclofenac Sodium Gel 1% (1.16% Diethylamine Equiv): CUTANEOUS | 12 days supply | Qty: 100 | Fill #0 | Status: AC

## 2021-11-10 ENCOUNTER — Ambulatory Visit: Payer: No Typology Code available for payment source | Admitting: Family Medicine

## 2021-11-14 ENCOUNTER — Encounter: Payer: Self-pay | Admitting: Family Medicine

## 2021-11-14 ENCOUNTER — Other Ambulatory Visit: Payer: Self-pay | Admitting: Family Medicine

## 2021-11-14 ENCOUNTER — Other Ambulatory Visit: Payer: Self-pay

## 2021-11-14 DIAGNOSIS — M7541 Impingement syndrome of right shoulder: Secondary | ICD-10-CM

## 2021-11-14 DIAGNOSIS — M7521 Bicipital tendinitis, right shoulder: Secondary | ICD-10-CM

## 2021-11-14 MED ORDER — WEGOVY 1 MG/0.5ML ~~LOC~~ SOAJ
1.0000 mg | SUBCUTANEOUS | 0 refills | Status: DC
  Filled 2021-11-14: qty 2, 28d supply, fill #0

## 2021-11-14 MED ORDER — MELOXICAM 15 MG PO TABS
15.0000 mg | ORAL_TABLET | Freq: Every day | ORAL | 0 refills | Status: DC
  Filled 2021-11-24: qty 30, 30d supply, fill #0

## 2021-11-14 NOTE — Telephone Encounter (Signed)
Requested Prescriptions  Pending Prescriptions Disp Refills  . meloxicam (MOBIC) 15 MG tablet 30 tablet 0    Sig: Take 1 tablet (15 mg total) by mouth daily.     Analgesics:  COX2 Inhibitors Failed - 11/14/2021 10:25 AM      Failed - Manual Review: Labs are only required if the patient has taken medication for more than 8 weeks.      Passed - HGB in normal range and within 360 days    Hemoglobin  Date Value Ref Range Status  08/25/2021 12.8 11.7 - 15.5 g/dL Final  08/04/2018 12.2 11.1 - 15.9 g/dL Final         Passed - Cr in normal range and within 360 days    Creat  Date Value Ref Range Status  05/26/2021 0.86 0.50 - 1.03 mg/dL Final         Passed - HCT in normal range and within 360 days    HCT  Date Value Ref Range Status  08/25/2021 38.8 35.0 - 45.0 % Final   Hematocrit  Date Value Ref Range Status  08/04/2018 36.3 34.0 - 46.6 % Final         Passed - AST in normal range and within 360 days    AST  Date Value Ref Range Status  05/26/2021 17 10 - 35 U/L Final         Passed - ALT in normal range and within 360 days    ALT  Date Value Ref Range Status  05/26/2021 19 6 - 29 U/L Final         Passed - eGFR is 30 or above and within 360 days    GFR, Est African American  Date Value Ref Range Status  09/08/2019 122 > OR = 60 mL/min/1.51m Final   GFR, Est Non African American  Date Value Ref Range Status  09/08/2019 105 > OR = 60 mL/min/1.770mFinal   GFR, Estimated  Date Value Ref Range Status  12/08/2019 >60 >60 mL/min Final    Comment:    (NOTE) Calculated using the CKD-EPI Creatinine Equation (2021)    eGFR  Date Value Ref Range Status  05/26/2021 81 > OR = 60 mL/min/1.7381minal    Comment:    The eGFR is based on the CKD-EPI 2021 equation. To calculate  the new eGFR from a previous Creatinine or Cystatin C result, go to https://www.kidney.org/professionals/ kdoqi/gfr%5Fcalculator          Passed - Patient is not pregnant      Passed -  Valid encounter within last 12 months    Recent Outpatient Visits          1 week ago [facility] mid[facility] low back [facility]   1 month ago Rotator cuff impingement syndrome, right   [place] [facility]   2 months ago Migraine without aura and without status migrainosus, not intractable   [facility]   3 months ago Meralgia paresthetica of right side   [place] [facility]   5 months ago [facility]eralgia [facility]aresthetica of right side   [facility]      Future Appointments            In 1  week Montel Culver, MD [facility] at [facility], [facility]   In 4 weeks Steele Sizer, MD [facility], [facility]

## 2021-11-16 ENCOUNTER — Other Ambulatory Visit: Payer: Self-pay

## 2021-11-24 ENCOUNTER — Ambulatory Visit (INDEPENDENT_AMBULATORY_CARE_PROVIDER_SITE_OTHER): Payer: No Typology Code available for payment source | Admitting: Family Medicine

## 2021-11-24 ENCOUNTER — Other Ambulatory Visit: Payer: Self-pay

## 2021-11-24 ENCOUNTER — Encounter: Payer: Self-pay | Admitting: Family Medicine

## 2021-11-24 VITALS — BP 120/70 | HR 87 | Ht 59.0 in | Wt 174.0 lb

## 2021-11-24 DIAGNOSIS — M7541 Impingement syndrome of right shoulder: Secondary | ICD-10-CM | POA: Diagnosis not present

## 2021-11-24 DIAGNOSIS — M7521 Bicipital tendinitis, right shoulder: Secondary | ICD-10-CM | POA: Diagnosis not present

## 2021-11-24 MED ORDER — MELOXICAM 7.5 MG PO TABS
7.5000 mg | ORAL_TABLET | Freq: Every day | ORAL | 0 refills | Status: DC | PRN
  Filled 2021-11-24: qty 120, 60d supply, fill #0

## 2021-11-24 NOTE — Assessment & Plan Note (Signed)
See additional assessment(s) for plan details. 

## 2021-11-24 NOTE — Assessment & Plan Note (Addendum)
Patient presents for follow-up to right shoulder pain in the setting of rotator cuff impingement and biceps tendinitis noted on 09/29/2021 during her last visit.  This had been ongoing for 6-8 months at that time.  Has been compliant with the meloxicam, noted improvement though not resolution.  She has been out of the medication over the past week and has noted gradual recurrence and worsening of shoulder pain, primarily laterally.  Examination significant for painless 5/5 strength internal/external rotation, empty can test with maximal pain, 4/5 strength, minimally tender biceps tendon and subacromial space, positive speeds, negative Yergason's, positive impingement.  Discussed various treatment strategies, plan for modification of meloxicam to 7.5 mg, 1-2 tablets daily as needed, start of formal physical therapy, reevaluation in 2 months.  Suboptimal progress can be addressed by corticosteroid injections, advanced imaging, further medication management.

## 2021-11-24 NOTE — Progress Notes (Signed)
     Primary Care / Sports Medicine Office Visit  Patient Information:  Patient ID: Shelby Henson, female DOB: 1971/01/01 Age: 51 y.o. MRN: [id number]   Shelby Henson is a pleasant 51 y.o. female presenting with the following:  Chief Complaint  Patient presents with   Rotator cuff impingement syndrome, right    Vitals:   11/24/21 0919  BP: 120/70  Pulse: 87  SpO2: 98%   Vitals:   11/24/21 0919  Weight: 174 lb (78.9 kg)  Height: '4\' 11"'$  (1.499 m)   Body mass index is 35.14 kg/m.  No results found.   Independent interpretation of notes and tests performed by another provider:   None  Procedures performed:   None  Pertinent History, Exam, Impression, and Recommendations:   Problem List Items Addressed This Visit       Musculoskeletal and Integument   Rotator cuff impingement syndrome, right - Primary    Patient presents for follow-up to right shoulder pain in the setting of rotator cuff impingement and biceps tendinitis noted on 09/29/2021 during her last visit.  This had been ongoing for 6-8 months at that time.  Has been compliant with the meloxicam, noted improvement though not resolution.  She has been out of the medication over the past week and has noted gradual recurrence and worsening of shoulder pain, primarily laterally.  Examination significant for painless 5/5 strength internal/external rotation, empty can test with maximal pain, 4/5 strength, minimally tender biceps tendon and subacromial space, positive speeds, negative Yergason's, positive impingement.  Discussed various treatment strategies, plan for modification of meloxicam to 7.5 mg, 1-2 tablets daily as needed, start of formal physical therapy, reevaluation in 2 months.  Suboptimal progress can be addressed by corticosteroid injections, advanced imaging, further medication management.      Relevant Medications   meloxicam (MOBIC) 7.5 MG tablet   Biceps tendinitis, right    See additional assessment(s)  for plan details.      Relevant Medications   meloxicam (MOBIC) 7.5 MG tablet     Orders & Medications Meds ordered this encounter  Medications   meloxicam (MOBIC) 7.5 MG tablet    Sig: Take 1-2 tablets (7.5-15 mg total) by mouth daily as needed for pain.    Dispense:  120 tablet    Refill:  0   No orders of the defined types were placed in this encounter.    Return in about 2 months (around 01/24/2022).     Montel Culver, MD   Primary Care Sports Medicine [place]

## 2021-11-28 ENCOUNTER — Other Ambulatory Visit: Payer: Self-pay

## 2021-11-30 ENCOUNTER — Other Ambulatory Visit: Payer: Self-pay

## 2021-11-30 ENCOUNTER — Encounter: Payer: Self-pay | Admitting: Family Medicine

## 2021-11-30 DIAGNOSIS — M545 Low back pain, unspecified: Secondary | ICD-10-CM

## 2021-11-30 NOTE — Telephone Encounter (Signed)
Please advise 

## 2021-12-04 ENCOUNTER — Encounter: Payer: Self-pay | Admitting: Family Medicine

## 2021-12-04 NOTE — Telephone Encounter (Signed)
Please review.  KP

## 2021-12-05 ENCOUNTER — Other Ambulatory Visit: Payer: Self-pay

## 2021-12-06 ENCOUNTER — Other Ambulatory Visit: Payer: Self-pay

## 2021-12-06 DIAGNOSIS — M7521 Bicipital tendinitis, right shoulder: Secondary | ICD-10-CM

## 2021-12-12 ENCOUNTER — Ambulatory Visit: Payer: No Typology Code available for payment source | Admitting: Family Medicine

## 2021-12-13 ENCOUNTER — Ambulatory Visit: Payer: No Typology Code available for payment source | Attending: Family Medicine

## 2021-12-13 DIAGNOSIS — M6281 Muscle weakness (generalized): Secondary | ICD-10-CM | POA: Diagnosis present

## 2021-12-13 DIAGNOSIS — M7521 Bicipital tendinitis, right shoulder: Secondary | ICD-10-CM | POA: Insufficient documentation

## 2021-12-13 DIAGNOSIS — M25511 Pain in right shoulder: Secondary | ICD-10-CM | POA: Insufficient documentation

## 2021-12-13 DIAGNOSIS — M25619 Stiffness of unspecified shoulder, not elsewhere classified: Secondary | ICD-10-CM | POA: Diagnosis present

## 2021-12-13 NOTE — Therapy (Signed)
OUTPATIENT PHYSICAL THERAPY SHOULDER EVALUATION   Patient Name: Shelby Henson MRN: [id number] DOB:1970-11-21, 51 y.o., female Today's Date: 12/13/2021   PT End of Session - 12/13/21 1807     Visit Number 1    Number of Visits 24    Date for PT Re-Evaluation 03/07/22    Progress Note Due on Visit 10    PT Start Time 0800    PT Stop Time 0845    PT Time Calculation (min) 45 min    Activity Tolerance Patient tolerated treatment well;Patient limited by pain    Behavior During Therapy WFL for tasks assessed/performed             Past Medical History:  Diagnosis Date   Anemia    Asthma    well controlled   Complication of anesthesia    Depression    GERD (gastroesophageal reflux disease)    occ   Migraine    Morbid obesity ([place])    Osteoarthritis    PONV (postoperative nausea and vomiting)    with c-section x 1   Past Surgical History:  Procedure Laterality Date   ABLATION     BREAST CYST ASPIRATION Left    CESAREAN SECTION     CESAREAN SECTION     CESAREAN SECTION     COLONOSCOPY N/A 09/22/2020   Procedure: COLONOSCOPY;  Surgeon: Virgel Manifold, MD;  Location: [facility] END[facility]COPY;  Service: Endoscopy;  Laterality: N/A;   HIP SURGERY  10/2018   LAPAROSCOPY  2018   LEEP  2005   benign pathology; Dr. Laurey Morale   SUPRACERVICAL ABDOMINAL HYSTERECTOMY  2010   leio/AUB; Dr. Laurey Morale   TOTAL HIP ARTHROPLASTY Right 11/12/2018   Procedure: TOTAL HIP ARTHROPLASTY ANTERIOR APPROACH;  Surgeon: Lovell Sheehan, MD;  Location: [facility] ORS;  Service: Orthopedics;  Laterality: Right;   Patient Active Problem List   Diagnosis Date Noted   Rotator cuff impingement syndrome, right 09/29/2021   Biceps tendinitis, right 09/29/2021   Meralgia paresthetica of right side 03/29/2021   Psoas tendinitis, right hip 03/29/2021   History of total right hip arthroplasty 03/29/2021   Status post total replacement of right hip 02/19/2021   Meralgia paresthetica of right side 02/19/2021   Polyp  of ascending colon 02/19/2021   Polyp of sigmoid colon 02/19/2021   Special screening for malignant neoplasms, colon    Polyp of ascending colon    Polyp of sigmoid colon    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 03/23/2020   Pain in right hip 01/12/2019   Vitamin D deficiency 02/25/2018   Chronic pain 10/24/2015   Hot flashes 10/24/2015   Migraine without aura and without status migrainosus, not intractable 86/57/8469   Metabolic syndrome 62/95/2841   Dyslipidemia 10/24/2015   Allergic rhinitis, seasonal 05/17/2015   Asthma, mild intermittent 08/08/2014   Chronic insomnia 08/08/2014   DDD (degenerative disc disease), lumbar 08/08/2014   Mood disorder ([place]) 08/08/2014   Gastro-esophageal reflux disease without esophagitis 08/08/2014   H/O total hysterectomy 08/08/2014   Irritable bowel syndrome with diarrhea 08/08/2014   Osteoarthritis of right hip 08/08/2014   Adult BMI 30+ 08/08/2014   SBO (spina bifida occulta) 08/08/2014    PCP: Dr. Steele Sizer  REFERRING PROVIDER: Dr. Rosette Reveal  REFERRING DIAG:  bicep tendonitis/shoulder   THERAPY DIAG:  Muscle weakness (generalized)  Right shoulder pain, unspecified chronicity  Limited range of motion (ROM) of shoulder  Rationale for Evaluation and Treatment: Rehabilitation  ONSET DATE: 6  months ago- around March 2023  SUBJECTIVE:                                                                                                                                                                                      SUBJECTIVE STATEMENT: Patient reports approx 6 month history of Right arm/shoulder issues- Stating mostly "clicking" and "Popping" with reaching and moving right arm. She report the arm as more irritating than painful but states would like to be able to raise or move her arm normally without any issues.    PERTINENT HISTORY: Per office visit note from 11/24/2021 from Dr. Collins [place]:   Patient presents for follow-up to right shoulder pain in the setting of rotator cuff impingement and biceps tendinitis noted on 09/29/2021 during her last visit. This had been ongoing for 6-8 months at that time.  Has been compliant with the meloxicam, noted improvement though not resolution.     PAIN:  Are you having pain? Yes: NPRS scale: 3-4/10 Pain location: R lateral shoulder/arm Pain description: Clicking and popping; some  Aggravating factors: moving arm Relieving factors: Rest, Meloxicam  PRECAUTIONS: None  WEIGHT BEARING RESTRICTIONS: No  FALLS:  Has patient fallen in last 6 months? 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   NEXT MD VISIT:   OBJECTIVE:   DIAGNOSTIC FINDINGS:  EXAM: RIGHT SHOULDER - 2+ VIEW   COMPARISON:  None Available.   FINDINGS: There is no evidence of fracture or dislocation. There is no evidence of arthropathy or other focal bone abnormality. Soft tissues are unremarkable.   IMPRESSION: Negative.     Electronically Signed   By: Dorise Bullion III M.D.   On: 08/08/2021 19:05  PATIENT SURVEYS:  [name]   and FOTO 54  COGNITION: Overall cognitive status: Within functional limits for tasks assessed     SENSATION: WFL  POSTURE: Forward head  Protracted shoulders  UPPER EXTREMITY ROM:   Active ROM Right eval Left eval  Shoulder flexion 170 WNL throughout Left UE  Shoulder extension 64   Shoulder abduction 85*   Shoulder adduction NT   Shoulder internal rotation 75   Shoulder external rotation 80*   Elbow flexion Full   Elbow extension Full   Wrist flexion WNL   Wrist extension WNL   Wrist ulnar deviation    Wrist radial deviation     Wrist pronation  Wrist supination    (Blank rows = not tested)  UPPER EXTREMITY MMT:  MMT Right eval Left eval  Shoulder flexion    Shoulder extension    Shoulder abduction    Shoulder adduction    Shoulder internal rotation    Shoulder external rotation    Middle trapezius    Lower trapezius    Elbow flexion    Elbow extension    Wrist flexion    Wrist extension    Wrist ulnar deviation    Wrist radial deviation    Wrist pronation    Wrist supination    Grip strength (lbs)    (Blank rows = not tested)  SHOULDER SPECIAL TESTS: Impingement tests: Neer impingement test: positive  and Hawkins/Kennedy impingement test: positive  SLAP lesions: Crank test: negative Instability tests: Sulcus sign: negative Rotator cuff assessment: Drop arm test: negative and Full can test: positive  Biceps assessment: Yergason's test: negative and Speed's test: positive   JOINT MOBILITY TESTING:  WNL  PALPATION:  + Tenderness located around anteriolateral  right shoulder and down into muscle belly of biceps.   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 female  who was seen today for physical therapy evaluation and treatment for biceps tendonitis and right shoulder pain. PT examination reveals deficits in intermittent R  shoulder pain, limited active ROM, and  Right UE muscle weakness. Pt will benefit from PT services to address deficits in strength, mobility, and pain in order to return to full function at home and work with less shoulder pain.  OBJECTIVE IMPAIRMENTS: decreased ROM, decreased strength, impaired UE functional use, and pain.   ACTIVITY LIMITATIONS: carrying, lifting, and reach over head  PARTICIPATION LIMITATIONS: occupation and yard work  PERSONAL FACTORS: 1 comorbidity: OA  are also affecting patient's functional outcome.   REHAB POTENTIAL: Good  CLINICAL DECISION MAKING: Stable/uncomplicated  EVALUATION COMPLEXITY: Low   GOALS: Goals reviewed with patient? Yes  SHORT TERM GOALS: Target date: 01/24/2022  (Remove Blue Hyperlink)  Pt will be independent with HEP in order to improve strength and decrease pain in order to improve pain-free function at home and work. Baseline: EVAL- No formal HEP in place Goal status: INITIAL   LONG TERM  GOALS: Target date: 03/07/2022  (Remove Blue Hyperlink)  Pt will improve FOTO to target score of 66 to display perceived improvements in ability to complete ADL's.  Baseline: Eval=54 Goal status: INITIAL  2.  Pt will decrease quick DASH score by at least 8% in order to demonstrate clinically significant reduction in disability.  Baseline: EVAL Goal status: INITIAL  3.  Pt will decrease worst pain as reported on NPRS by at least 3 points in order to demonstrate clinically significant reduction in pain.  Baseline: EVAL= 4/10 Goal status: INITIAL  4.  Pt will increase strength of  by at least 1/2 MMT grade in order to demonstrate improvement in strength and function  Baseline: EVAL= 4/5 with shoulder ABD Goal status: INITIAL PLAN:  PT FREQUENCY: 1-2x/week  PT DURATION: 12 weeks  PLANNED INTERVENTIONS: Therapeutic exercises, Therapeutic activity, Patient/Family education, Self Care, Joint mobilization, Joint manipulation, Dry Needling,  Electrical stimulation, Spinal manipulation, Spinal mobilization, Cryotherapy, Moist heat, Taping, and Manual therapy  PLAN FOR NEXT SESSION: Manual Therapy for ROM and progressive therex for ROM and strengthening.                                                                                                                                                                                                                  Lewis Moccasin, PT 12/13/2021, 8:43 PM

## 2021-12-18 ENCOUNTER — Ambulatory Visit: Payer: No Typology Code available for payment source

## 2021-12-18 DIAGNOSIS — R208 Other disturbances of skin sensation: Secondary | ICD-10-CM

## 2021-12-18 DIAGNOSIS — M6281 Muscle weakness (generalized): Secondary | ICD-10-CM | POA: Diagnosis not present

## 2021-12-18 DIAGNOSIS — M25511 Pain in right shoulder: Secondary | ICD-10-CM

## 2021-12-18 DIAGNOSIS — R262 Difficulty in walking, not elsewhere classified: Secondary | ICD-10-CM

## 2021-12-18 DIAGNOSIS — M25619 Stiffness of unspecified shoulder, not elsewhere classified: Secondary | ICD-10-CM

## 2021-12-18 DIAGNOSIS — M25551 Pain in right hip: Secondary | ICD-10-CM

## 2021-12-18 DIAGNOSIS — R2689 Other abnormalities of gait and mobility: Secondary | ICD-10-CM

## 2021-12-18 NOTE — Therapy (Signed)
OUTPATIENT PHYSICAL THERAPY SHOULDER TREATMENT    Patient Name: Shelby Henson MRN: [id number] DOB:Sep 09, 1970, 51 y.o., female Today's Date: 12/19/2021   PT End of Session - 12/18/21 1713     Visit Number 2    Number of Visits 24    Date for PT Re-Evaluation 03/07/22    Progress Note Due on Visit 10    PT Start Time 1018    PT Stop Time 1050    PT Time Calculation (min) 32 min    Activity Tolerance Patient tolerated treatment well;Patient limited by pain    Behavior During Therapy WFL for tasks assessed/performed             Past Medical History:  Diagnosis Date   Anemia    Asthma    well controlled   Complication of anesthesia    Depression    GERD (gastroesophageal reflux disease)    occ   Migraine    Morbid obesity ([place])    Osteoarthritis    PONV (postoperative nausea and vomiting)    with c-section x 1   Past Surgical History:  Procedure Laterality Date   ABLATION     BREAST CYST ASPIRATION Left    CESAREAN SECTION     CESAREAN SECTION     CESAREAN SECTION     COLONOSCOPY N/A 09/22/2020   Procedure: COLONOSCOPY;  Surgeon: Virgel Manifold, MD;  Location: [facility];  Service: Endoscopy;  Laterality: N/A;   HIP SURGERY  10/2018   LAPAROSCOPY  2018   LEEP  2005   benign pathology; Dr. Laurey Morale   SUPRACERVICAL ABDOMINAL HYSTERECTOMY  2010   leio/AUB; Dr. Laurey Morale   TOTAL HIP ARTHROPLASTY Right 11/12/2018   Procedure: TOTAL HIP ARTHROPLASTY ANTERIOR APPROACH;  Surgeon: Lovell Sheehan, MD;  Location: [facility];  Service: Orthopedics;  Laterality: Right;   Patient Active Problem List   Diagnosis Date Noted   Rotator cuff impingement syndrome, right 09/29/2021   Biceps tendinitis, right 09/29/2021   Meralgia paresthetica of right side 03/29/2021   Psoas tendinitis, right hip 03/29/2021   History of total right hip arthroplasty 03/29/2021   Status post total replacement of right hip 02/19/2021   Meralgia paresthetica of right side 02/19/2021   Polyp  of ascending colon 02/19/2021   Polyp of sigmoid colon 02/19/2021   Special screening for malignant neoplasms, colon    Polyp of ascending colon    Polyp of sigmoid colon    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 03/23/2020   Pain in right hip 01/12/2019   Vitamin D deficiency 02/25/2018   Chronic pain 10/24/2015   Hot flashes 10/24/2015   Migraine without aura and without status migrainosus, not intractable 45/80/9983   Metabolic syndrome 38/25/0539   Dyslipidemia 10/24/2015   Allergic rhinitis, seasonal 05/17/2015   Asthma, mild intermittent 08/08/2014   Chronic insomnia 08/08/2014   DDD (degenerative disc disease), lumbar 08/08/2014   Mood disorder ([place]) 08/08/2014   Gastro-esophageal reflux disease without esophagitis 08/08/2014   H/O total hysterectomy 08/08/2014   Irritable bowel syndrome with diarrhea 08/08/2014   Osteoarthritis of right hip 08/08/2014   Adult BMI 30+ 08/08/2014   SBO (spina bifida occulta) 08/08/2014    PCP: Dr. Steele Sizer  REFERRING PROVIDER: Dr. Rosette Reveal  REFERRING DIAG:  bicep tendonitis/shoulder   THERAPY DIAG:  Muscle weakness (generalized)  Right shoulder pain, unspecified chronicity  Limited range of motion (ROM) of shoulder  Rationale for Evaluation and Treatment: Rehabilitation  ONSET DATE:  6 months ago- around March 2023  SUBJECTIVE:                                                                                                                                                                                      SUBJECTIVE STATEMENT: Patient reports ongoing pain in right lateral arm and into bicep. States she was very busy this weekend and likely "overdid it." PERTINENT HISTORY: Per office visit note from 11/24/2021 from Dr. Collins Scotland:  Patient presents for follow-up to right shoulder pain in the setting of rotator cuff impingement and biceps tendinitis noted on 09/29/2021 during her last visit.  This had been ongoing for 6-8 months at that time.  Has been compliant with the meloxicam, noted improvement though not resolution.     PAIN:  Are you having pain? Yes: NPRS scale: 3-4/10 Pain location: R lateral shoulder/arm Pain description: Clicking and popping; some  Aggravating factors: moving arm Relieving factors: Rest, Meloxicam  PRECAUTIONS: None  WEIGHT BEARING RESTRICTIONS: No  FALLS:  Has patient fallen in last 6 months? 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   NEXT MD VISIT:   OBJECTIVE:   DIAGNOSTIC FINDINGS:  EXAM: RIGHT SHOULDER - 2+ VIEW   COMPARISON:  None Available.   FINDINGS: There is no evidence of fracture or dislocation. There is no evidence of arthropathy or other focal bone abnormality. Soft tissues are unremarkable.   IMPRESSION: Negative.     Electronically Signed   By: Dorise Bullion III M.D.   On: 08/08/2021 19:05  PATIENT SURVEYS:  Shelby Henson   and FOTO 54  COGNITION: Overall cognitive status: Within functional limits for tasks assessed     SENSATION: WFL  POSTURE: Forward head  Protracted shoulders  UPPER EXTREMITY ROM:   Active ROM Right eval Left eval  Shoulder flexion 170 WNL throughout Left UE  Shoulder extension 64   Shoulder abduction 85*   Shoulder adduction NT   Shoulder internal rotation 75   Shoulder external rotation 80*   Elbow flexion Full   Elbow extension Full   Wrist flexion WNL   Wrist extension WNL   Wrist ulnar deviation    Wrist radial deviation    Wrist pronation    Wrist supination    (Blank rows = not tested)  UPPER EXTREMITY MMT:  MMT Right eval Left eval  Shoulder flexion  Shoulder  extension    Shoulder abduction    Shoulder adduction    Shoulder internal rotation    Shoulder external rotation    Middle trapezius    Lower trapezius    Elbow flexion    Elbow extension    Wrist flexion    Wrist extension    Wrist ulnar deviation    Wrist radial deviation    Wrist pronation    Wrist supination    Grip strength (lbs)    (Blank rows = not tested)  SHOULDER SPECIAL TESTS: Impingement tests: Neer impingement test: positive  and Hawkins/Kennedy impingement test: positive  SLAP lesions: Crank test: negative Instability tests: Sulcus sign: negative Rotator cuff assessment: Drop arm test: negative and Full can test: positive  Biceps assessment: Yergason's test: negative and Speed's test: positive   JOINT MOBILITY TESTING:  WNL  PALPATION:  + Tenderness located around anteriolateral  right shoulder and down into muscle belly of biceps.   TODAY'S TREATMENT:                                                                                                                                         DATE:   Manual Therapy: (supine)  STM to Right lateral Shoulder (tight with palpation) STM to right bicep region (proximal, muscle belly and distal tendons)  PROM to right shoulder in supine (full ROM) some pain with ER. Improved with static manual stretch.  Grade 2-3 PA/AP/Inf GH joint mobs- 30 bouts multiple angles of flex/scap/abd and later in ER.    THEREX:   Instructed in Isometric strengthening for RC and bicep:  Bicep isometric-Hold 5 sec x 5 against opp arm Shoulder ER (infraspinatus/teres minor) - stand at wall with pillow x 5 sec hold x 10 Shoulder ABD (supraspinatus) - stand at wall with pillow x 5 sec hold x 10   TDN Treatment: (Unbilled)  Patient consent: After explanation of TDN Rationale, Procedures, outcomes, and potential side effects, patient verbalized consent to TDN treatment. Region/Dx: Right shoulder pain/Bicep tendonitis Muscles Treated: Right  bicep in 2 tight band regions # of needles used: 1 (0.30x 30)  Post treatment pain/response: Patient reports less bicipital pain; No immediate soreness from needling Post treatment Instructions: Patient instructed to expect mild to moderate muscle soreness this evening and tomorrow. Patient instructed to continued prescribed home exercise program. Patient verbalized understanding of these instructions.   PATIENT EDUCATION: Education details: Exercise technique and HEP Person educated: Patient Education method: Explanation, Demonstration, and Verbal cues Education comprehension: verbalized understanding, returned demonstration, and needs further education  HOME EXERCISE PROGRAM: Access Code: [email or web address] URL: [email or web address] Date: 12/19/2021 Prepared by: Sande Brothers  Exercises - Standing Isometric Shoulder Abduction with Doorway  - 1 x daily - 3 x weekly - 3 sets - 10 reps - Standing Isometric Shoulder External Rotation with Doorway  - 1 x daily - 3 x weekly - 3  sets - 10 reps  EVAL: ROM using wand for Shoulder flex/abd/ER  ASSESSMENT:  CLINICAL IMPRESSION:  Patient presented with excellent motivation for today's session. She presented with multiple areas of tightness along lateral right shoulder down into bicep. She responded favorably to initial DN treatment and later STM with less tightness noted and patient was less sore. She did have movements of impingement with Arm raised- improved with inferior distraction. She was able to complete beginner isometric strengthening without impingement or replication of right shoulder/arm pain. Pt will continue to benefit from PT services to address deficits in strength, UE mobility, and pain in order to return to full function at home and work with less shoulder pain.  OBJECTIVE IMPAIRMENTS: decreased ROM, decreased strength, impaired UE functional use, and pain.   ACTIVITY LIMITATIONS: carrying, lifting, and reach over  head  PARTICIPATION LIMITATIONS: occupation and yard work  PERSONAL FACTORS: 1 comorbidity: OA  are also affecting patient's functional outcome.   REHAB POTENTIAL: Good  CLINICAL DECISION MAKING: Stable/uncomplicated  EVALUATION COMPLEXITY: Low   GOALS: Goals reviewed with patient? Yes  SHORT TERM GOALS: Target date: 01/24/2022  (Remove Blue Hyperlink)  Pt will be independent with HEP in order to improve strength and decrease pain in order to improve pain-free function at home and work. Baseline: EVAL- No formal HEP in place Goal status: INITIAL   LONG TERM GOALS: Target date: 03/07/2022  (Remove Blue Hyperlink)  Pt will improve FOTO to target score of 66 to display perceived improvements in ability to complete ADL's.  Baseline: Eval=54 Goal status: INITIAL  2.  Pt will decrease quick DASH score by at least 8% in order to demonstrate clinically significant reduction in disability.  Baseline: EVAL Goal status: INITIAL  3.  Pt will decrease worst pain as reported on NPRS by at least 3 points in order to demonstrate clinically significant reduction in pain.  Baseline: EVAL= 4/10 Goal status: INITIAL  4.  Pt will increase strength of  by at least 1/2 MMT grade in order to demonstrate improvement in strength and function  Baseline: EVAL= 4/5 with shoulder ABD Goal status: INITIAL PLAN:  PT FREQUENCY: 1-2x/week  PT DURATION: 12 weeks  PLANNED INTERVENTIONS: Therapeutic exercises, Therapeutic activity, Patient/Family education, Self Care, Joint mobilization, Joint manipulation, Dry Needling, Electrical stimulation, Spinal manipulation, Spinal mobilization, Cryotherapy, Moist heat, Taping, and Manual therapy  PLAN FOR NEXT SESSION: Manual Therapy for ROM and progressive therex for ROM and strengthening.  Lewis Moccasin, PT 12/19/2021, 10:05 AM  OUTPATIENT PHYSICAL THERAPY SHOULDER EVALUATION   Patient Name: Shelby Henson MRN: [id number] DOB:02-Nov-1970, 51 y.o., female Today's Date: 12/19/2021   PT End of Session - 12/18/21 1713     Visit Number 2    Number of Visits 24    Date for PT Re-Evaluation 03/07/22    Progress Note Due on Visit 10    PT Start Time 1018    PT Stop Time 1050    PT Time Calculation (min) 32 min    Activity Tolerance Patient tolerated treatment well;Patient limited by pain    Behavior During Therapy WFL for tasks assessed/performed             Past Medical History:  Diagnosis Date   Anemia    Asthma    well controlled   Complication of anesthesia    Depression    GERD (gastroesophageal reflux disease)    occ   Migraine    Morbid obesity ([place])    Osteoarthritis    PONV (postoperative nausea and vomiting)    with c-section x 1   Past Surgical History:  Procedure Laterality Date   ABLATION     BREAST CYST ASPIRATION Left    CESAREAN SECTION     CESAREAN SECTION     CESAREAN SECTION     COLONOSCOPY N/A 09/22/2020   Procedure: COLONOSCOPY;  Surgeon: Virgel Manifold, MD;  Location: [facility];  Service: Endoscopy;  Laterality: N/A;   HIP SURGERY  10/2018   LAPAROSCOPY  2018   LEEP  2005   benign pathology; Dr. Laurey Morale   SUPRACERVICAL ABDOMINAL HYSTERECTOMY  2010   leio/AUB; Dr. Laurey Morale   TOTAL HIP ARTHROPLASTY Right 11/12/2018   Procedure: TOTAL HIP ARTHROPLASTY ANTERIOR APPROACH;  Surgeon: Lovell Sheehan, MD;  Location:  [facility];  Service: Orthopedics;  Laterality: Right;   Patient Active Problem List   Diagnosis Date Noted   Rotator cuff impingement syndrome, right 09/29/2021   Biceps tendinitis, right 09/29/2021   Meralgia paresthetica of right side 03/29/2021   Psoas tendinitis, right hip 03/29/2021   History of total right hip arthroplasty 03/29/2021   Status post total replacement of right hip 02/19/2021   Meralgia paresthetica of right side 02/19/2021   Polyp of ascending colon 02/19/2021   Polyp of sigmoid colon 02/19/2021   Special screening for malignant neoplasms, colon    Polyp of ascending colon    Polyp of sigmoid colon    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 03/23/2020   Pain in right hip 01/12/2019   Vitamin D deficiency 02/25/2018   Chronic pain 10/24/2015   Hot flashes 10/24/2015   Migraine without aura and without status migrainosus, not intractable 58/09/9831   Metabolic syndrome 82/50/5397   Dyslipidemia 10/24/2015   Allergic rhinitis, seasonal 05/17/2015   Asthma, mild intermittent 08/08/2014   Chronic insomnia 08/08/2014   DDD (degenerative disc disease), lumbar 08/08/2014   Mood disorder ([place]) 08/08/2014   Gastro-esophageal reflux disease without esophagitis 08/08/2014   H/O total hysterectomy 08/08/2014   Irritable bowel syndrome with diarrhea 08/08/2014   Osteoarthritis of right hip 08/08/2014   Adult BMI 30+ 08/08/2014   SBO (spina bifida occulta) 08/08/2014    PCP: Dr. Steele Sizer  REFERRING PROVIDER: Dr. Rosette Reveal  REFERRING DIAG:  bicep tendonitis/shoulder   THERAPY DIAG:  Muscle weakness (generalized)  Right shoulder pain, unspecified chronicity  Limited range of motion (ROM) of shoulder  Rationale for  Evaluation and Treatment: Rehabilitation  ONSET DATE: 6 months ago- around March 2023  SUBJECTIVE:                                                                                                                                                                                       SUBJECTIVE STATEMENT: Patient reports approx 6 month history of Right arm/shoulder issues- Stating mostly "clicking" and "Popping" with reaching and moving right arm. She report the arm as more irritating than painful but states would like to be able to raise or move her arm normally without any issues.    PERTINENT HISTORY: Per office visit note from 11/24/2021 from Dr. Collins [place]:  Patient presents for follow-up to right shoulder pain in the setting of rotator cuff impingement and biceps tendinitis noted on 09/29/2021 during her last visit. This had been ongoing for 6-8 months at that time.  Has been compliant with the meloxicam, noted improvement though not resolution.     PAIN:  Are you having pain? Yes: NPRS scale: 3-4/10 Pain location: R lateral shoulder/arm Pain description: Clicking and popping; some  Aggravating factors: moving arm Relieving factors: Rest, Meloxicam  PRECAUTIONS: None  WEIGHT BEARING RESTRICTIONS: No  FALLS:  Has patient fallen in last 6 months? 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   NEXT MD VISIT:   OBJECTIVE:   DIAGNOSTIC FINDINGS:  EXAM: RIGHT SHOULDER - 2+ VIEW   COMPARISON:  None Available.   FINDINGS: There is no evidence of fracture or dislocation. There is no evidence of arthropathy or other focal bone abnormality. Soft tissues are unremarkable.   IMPRESSION: Negative.     Electronically Signed   By: Dorise Bullion III M.D.   On: 08/08/2021 19:05  PATIENT SURVEYS:  Shelby Henson   and FOTO 54  COGNITION: Overall cognitive status: Within functional limits for tasks  assessed     SENSATION: WFL  POSTURE: Forward head  Protracted shoulders  UPPER EXTREMITY ROM:   Active ROM Right eval Left eval  Shoulder flexion 170 WNL throughout Left UE  Shoulder extension 64   Shoulder abduction 85*   Shoulder adduction NT   Shoulder internal rotation 75   Shoulder external rotation 80*   Elbow flexion Full   Elbow extension Full   Wrist flexion WNL   Wrist extension WNL   Wrist ulnar deviation    Wrist  radial deviation    Wrist pronation    Wrist supination    (Blank rows = not tested)  UPPER EXTREMITY MMT:  MMT Right eval Left eval  Shoulder flexion    Shoulder extension    Shoulder abduction    Shoulder adduction    Shoulder internal rotation    Shoulder external rotation    Middle trapezius    Lower trapezius    Elbow flexion    Elbow extension    Wrist flexion    Wrist extension    Wrist ulnar deviation    Wrist radial deviation    Wrist pronation    Wrist supination    Grip strength (lbs)    (Blank rows = not tested)  SHOULDER SPECIAL TESTS: Impingement tests: Neer impingement test: positive  and Hawkins/Kennedy impingement test: positive  SLAP lesions: Crank test: negative Instability tests: Sulcus sign: negative Rotator cuff assessment: Drop arm test: negative and Full can test: positive  Biceps assessment: Yergason's test: negative and Speed's test: positive   JOINT MOBILITY TESTING:  WNL  PALPATION:  + Tenderness located around anteriolateral  right shoulder and down into muscle belly of biceps.   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 female  who was seen today for physical therapy evaluation and treatment for biceps tendonitis and right shoulder pain. PT examination reveals deficits in intermittent R shoulder pain, limited active ROM, and  Right UE muscle weakness. Pt will benefit from PT services to address deficits in strength, mobility, and pain in order to return to full function at home and work with less shoulder pain.  OBJECTIVE IMPAIRMENTS: decreased ROM, decreased strength, impaired UE functional use, and pain.   ACTIVITY LIMITATIONS: carrying, lifting, and reach over head  PARTICIPATION LIMITATIONS: occupation and yard work  PERSONAL FACTORS: 1 comorbidity: OA  are also affecting patient's functional outcome.   REHAB POTENTIAL: Good  CLINICAL DECISION MAKING: Stable/uncomplicated  EVALUATION COMPLEXITY: Low   GOALS: Goals reviewed with patient? Yes  SHORT TERM GOALS: Target date: 01/24/2022  (Remove Blue Hyperlink)  Pt will be independent with HEP in order to improve strength and decrease pain in order to improve pain-free function at home and work. Baseline: EVAL- No formal  HEP in place Goal status: INITIAL   LONG TERM GOALS: Target date: 03/07/2022  (Remove Blue Hyperlink)  Pt will improve FOTO to target score of 66 to display perceived improvements in ability to complete ADL's.  Baseline: Eval=54 Goal status: INITIAL  2.  Pt will decrease quick DASH score by at least 8% in order to demonstrate clinically significant reduction in disability.  Baseline: EVAL Goal status: INITIAL  3.  Pt will decrease worst pain as reported on NPRS by at least 3 points in order to demonstrate clinically significant  reduction in pain.  Baseline: EVAL= 4/10 Goal status: INITIAL  4.  Pt will increase strength of  by at least 1/2 MMT grade in order to demonstrate improvement in strength and function  Baseline: EVAL= 4/5 with shoulder ABD Goal status: INITIAL PLAN:  PT FREQUENCY: 1-2x/week  PT DURATION: 12 weeks  PLANNED INTERVENTIONS: Therapeutic exercises, Therapeutic activity, Patient/Family education, Self Care, Joint mobilization, Joint manipulation, Dry Needling, Electrical stimulation, Spinal manipulation, Spinal mobilization, Cryotherapy, Moist heat, Taping, and Manual therapy  PLAN FOR NEXT SESSION: Manual Therapy for ROM and progressive therex for ROM and strengthening.                                                                                                                                                                                                                  Lewis Moccasin, PT 12/19/2021, 10:05 AM

## 2021-12-19 ENCOUNTER — Other Ambulatory Visit: Payer: Self-pay

## 2021-12-19 ENCOUNTER — Other Ambulatory Visit: Payer: Self-pay | Admitting: Family Medicine

## 2021-12-19 DIAGNOSIS — M545 Low back pain, unspecified: Secondary | ICD-10-CM

## 2021-12-19 MED ORDER — LIDOCAINE 5 % EX PTCH
2.0000 | MEDICATED_PATCH | CUTANEOUS | 0 refills | Status: DC
  Filled 2021-12-19: qty 60, 30d supply, fill #0

## 2021-12-19 MED ORDER — METHOCARBAMOL 500 MG PO TABS
500.0000 mg | ORAL_TABLET | Freq: Four times a day (QID) | ORAL | 0 refills | Status: DC
  Filled 2021-12-19: qty 90, 23d supply, fill #0

## 2021-12-22 ENCOUNTER — Other Ambulatory Visit: Payer: Self-pay

## 2021-12-26 ENCOUNTER — Other Ambulatory Visit: Payer: Self-pay

## 2021-12-27 ENCOUNTER — Ambulatory Visit: Payer: No Typology Code available for payment source

## 2021-12-27 DIAGNOSIS — M6281 Muscle weakness (generalized): Secondary | ICD-10-CM

## 2021-12-27 DIAGNOSIS — M25619 Stiffness of unspecified shoulder, not elsewhere classified: Secondary | ICD-10-CM

## 2021-12-27 DIAGNOSIS — M25511 Pain in right shoulder: Secondary | ICD-10-CM

## 2021-12-27 NOTE — Therapy (Signed)
OUTPATIENT PHYSICAL THERAPY SHOULDER TREATMENT    Patient Name: Shelby Henson MRN: [id number] DOB:07-Jun-1970, 51 y.o., female Today's Date: 12/27/2021   PT End of Session - 12/27/21 1200     Visit Number 3    Number of Visits 24    Date for PT Re-Evaluation 03/07/22    Progress Note Due on Visit 10    PT Start Time 1018    PT Stop Time 1053    PT Time Calculation (min) 35 min    Activity Tolerance Patient tolerated treatment well;Patient limited by pain    Behavior During Therapy WFL for tasks assessed/performed             Past Medical History:  Diagnosis Date   Anemia    Asthma    well controlled   Complication of anesthesia    Depression    GERD (gastroesophageal reflux disease)    occ   Migraine    Morbid obesity ([place])    Osteoarthritis    PONV (postoperative nausea and vomiting)    with c-section x 1   Past Surgical History:  Procedure Laterality Date   ABLATION     BREAST CYST ASPIRATION Left    CESAREAN SECTION     CESAREAN SECTION     CESAREAN SECTION     COLONOSCOPY N/A 09/22/2020   Procedure: COLONOSCOPY;  Surgeon: Virgel Manifold, MD;  Location: [facility];  Service: Endoscopy;  Laterality: N/A;   HIP SURGERY  10/2018   LAPAROSCOPY  2018   LEEP  2005   benign pathology; Dr. Laurey Morale   SUPRACERVICAL ABDOMINAL HYSTERECTOMY  2010   leio/AUB; Dr. Laurey Morale   TOTAL HIP ARTHROPLASTY Right 11/12/2018   Procedure: TOTAL HIP ARTHROPLASTY ANTERIOR APPROACH;  Surgeon: Lovell Sheehan, MD;  Location: [facility];  Service: Orthopedics;  Laterality: Right;   Patient Active Problem List   Diagnosis Date Noted   Rotator cuff impingement syndrome, right 09/29/2021   Biceps tendinitis, right 09/29/2021   Meralgia paresthetica of right side 03/29/2021   Psoas tendinitis, right hip 03/29/2021   History of total right hip arthroplasty 03/29/2021   Status post total replacement of right hip 02/19/2021   Meralgia paresthetica of right side 02/19/2021   Polyp  of ascending colon 02/19/2021   Polyp of sigmoid colon 02/19/2021   Special screening for malignant neoplasms, colon    Polyp of ascending colon    Polyp of sigmoid colon    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 03/23/2020   Pain in right hip 01/12/2019   Vitamin D deficiency 02/25/2018   Chronic pain 10/24/2015   Hot flashes 10/24/2015   Migraine without aura and without status migrainosus, not intractable 33/29/5188   Metabolic syndrome 41/66/0630   Dyslipidemia 10/24/2015   Allergic rhinitis, seasonal 05/17/2015   Asthma, mild intermittent 08/08/2014   Chronic insomnia 08/08/2014   DDD (degenerative disc disease), lumbar 08/08/2014   Mood disorder ([place]) 08/08/2014   Gastro-esophageal reflux disease without esophagitis 08/08/2014   H/O total hysterectomy 08/08/2014   Irritable bowel syndrome with diarrhea 08/08/2014   Osteoarthritis of right hip 08/08/2014   Adult BMI 30+ 08/08/2014   SBO (spina bifida occulta) 08/08/2014    PCP: Dr. Steele Sizer  REFERRING PROVIDER: Dr. Rosette Reveal  REFERRING DIAG:  bicep tendonitis/shoulder   THERAPY DIAG:  Muscle weakness (generalized)  Right shoulder pain, unspecified chronicity  Limited range of motion (ROM) of shoulder  Rationale for Evaluation and Treatment: Rehabilitation  ONSET DATE:  6 months ago- around March 2023  SUBJECTIVE:                                                                                                                                                                                      SUBJECTIVE STATEMENT:  Patient reports ongoing pain lateral shoulder mostly- "catching when I raise my arm."   PERTINENT HISTORY: Per office visit note from 11/24/2021 from Dr. Collins Scotland:  Patient presents for follow-up to right shoulder pain in the setting of rotator cuff impingement and biceps tendinitis noted on 09/29/2021 during her last visit. This had been ongoing for 6-8 months at  that time.  Has been compliant with the meloxicam, noted improvement though not resolution.     PAIN:  Are you having pain? Yes: NPRS scale: 3-4/10 Pain location: R lateral shoulder/arm Pain description: Clicking and popping; some  Aggravating factors: moving arm Relieving factors: Rest, Meloxicam  PRECAUTIONS: None  WEIGHT BEARING RESTRICTIONS: No  FALLS:  Has patient fallen in last 6 months? 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   NEXT MD VISIT:   OBJECTIVE:   DIAGNOSTIC FINDINGS:  EXAM: RIGHT SHOULDER - 2+ VIEW   COMPARISON:  None Available.   FINDINGS: There is no evidence of fracture or dislocation. There is no evidence of arthropathy or other focal bone abnormality. Soft tissues are unremarkable.   IMPRESSION: Negative.     Electronically Signed   By: Dorise Bullion III M.D.   On: 08/08/2021 19:05  PATIENT SURVEYS:  Shelby Henson   and FOTO 54  COGNITION: Overall cognitive status: Within functional limits for tasks assessed     SENSATION: WFL  POSTURE: Forward head  Protracted shoulders  UPPER EXTREMITY ROM:   Active ROM Right eval Left eval  Shoulder flexion 170 WNL throughout Left UE  Shoulder extension 64   Shoulder abduction 85*   Shoulder adduction NT   Shoulder internal rotation 75   Shoulder external rotation 80*   Elbow flexion Full   Elbow extension Full   Wrist flexion WNL   Wrist extension WNL   Wrist ulnar deviation    Wrist radial deviation    Wrist pronation    Wrist supination    (Blank rows = not tested)  UPPER EXTREMITY MMT:  MMT Right eval Left eval  Shoulder flexion    Shoulder extension    Shoulder  abduction    Shoulder  adduction    Shoulder internal rotation    Shoulder external rotation    Middle trapezius    Lower trapezius    Elbow flexion    Elbow extension    Wrist flexion    Wrist extension    Wrist ulnar deviation    Wrist radial deviation    Wrist pronation    Wrist supination    Grip strength (lbs)    (Blank rows = not tested)  SHOULDER SPECIAL TESTS: Impingement tests: Neer impingement test: positive  and Hawkins/Kennedy impingement test: positive  SLAP lesions: Crank test: negative Instability tests: Sulcus sign: negative Rotator cuff assessment: Drop arm test: negative and Full can test: positive  Biceps assessment: Yergason's test: negative and Speed's test: positive   JOINT MOBILITY TESTING:  WNL  PALPATION:  + Tenderness located around anteriolateral  right shoulder and down into muscle belly of biceps.   TODAY'S TREATMENT:                                                                                                                                         DATE:   Manual Therapy: (supine)  STM to Right lateral Shoulder and bicep region (noteable tightness along anteriolateral shoulder) PROM to right shoulder in supine (full ROM) some pain with ER. Improved with static manual stretch.  Grade 2-3 PA/AP/Inf GH joint mobs- 30 bouts multiple angles of flex/scap/abd and later in ER.    THEREX:   Instructed in below 90 deg shoulder strengthening:   Shoulder abd using RTB x 10 reps x 2 (as painfree as possible)  Shoulder ER and IR using RTB (towel roll at elbow Shoulder retraction and posterior rolls x 10 reps  (VC for posture)   Bicep isometric-Hold 5 sec x 5 against opp arm Shoulder ER (infraspinatus/teres minor) - stand at wall with pillow x 5 sec hold x 10 Shoulder ABD (supraspinatus) - stand at wall with pillow x 5 sec hold x 10   TDN Treatment: (Unbilled)  Patient consent: After explanation of TDN Rationale, Procedures, outcomes, and potential side effects, patient  verbalized consent to TDN treatment. Region/Dx: Right shoulder pain/Bicep tendonitis Muscles Treated: Right lateral deltoid region x 2 # of needles used: 2 (0.30x 30)  Post treatment pain/response: Patient reports less bicipital pain; No immediate soreness from needling Post treatment Instructions: Patient instructed to expect mild to moderate muscle soreness this evening and tomorrow. Patient instructed to continued prescribed home exercise program. Patient verbalized understanding of these instructions.   PATIENT EDUCATION: Education details: Exercise technique and HEP Person educated: Patient Education method: Explanation, Demonstration, and Verbal cues Education comprehension: verbalized understanding, returned demonstration, and needs further education  HOME EXERCISE PROGRAM: Access Code: [email or web address] URL: [email or web address] Date: 12/27/2021 Prepared by: Sande Brothers  Exercises - Standing Single Arm Shoulder Abduction with Resistance  - 1 x daily - 3 x weekly -  3 sets - 10 reps - 2 hold - Shoulder Internal Rotation with Resistance  - 1 x daily - 3 x weekly - 3 sets - 10 reps - 2 hold - Shoulder External Rotation with Anchored Resistance  - 1 x daily - 3 x weekly - 3 sets - 10 reps - 2 hold   Access Code: [email or web address] URL: [email or web address] Date: 12/19/2021 Prepared by: Sande Brothers  Exercises - Standing Isometric Shoulder Abduction with Doorway  - 1 x daily - 3 x weekly - 3 sets - 10 reps - Standing Isometric Shoulder External Rotation with Doorway  - 1 x daily - 3 x weekly - 3 sets - 10 reps  EVAL: ROM using wand for Shoulder flex/abd/ER  ASSESSMENT:  CLINICAL IMPRESSION:  Patient arrived in good spirits - motivated for treatment- stating arm has intermittent catching- Able to capture some catching of Right arm with palpation while patient raising arm and less catching with scapula stabilized. She responded favorably with DN again  with no ill effects. She was able to participate in below 90 deg shoulder strengthening including rotator cuff strengthening. Added some RC strengthening to HEP.  Pt will continue to benefit from PT services to address deficits in strength, UE mobility, and pain in order to return to full function at home and work with less shoulder pain.  OBJECTIVE IMPAIRMENTS: decreased ROM, decreased strength, impaired UE functional use, and pain.   ACTIVITY LIMITATIONS: carrying, lifting, and reach over head  PARTICIPATION LIMITATIONS: occupation and yard work  PERSONAL FACTORS: 1 comorbidity: OA  are also affecting patient's functional outcome.   REHAB POTENTIAL: Good  CLINICAL DECISION MAKING: Stable/uncomplicated  EVALUATION COMPLEXITY: Low   GOALS: Goals reviewed with patient? Yes  SHORT TERM GOALS: Target date: 01/24/2022  (Remove Blue Hyperlink)  Pt will be independent with HEP in order to improve strength and decrease pain in order to improve pain-free function at home and work. Baseline: EVAL- No formal HEP in place Goal status: INITIAL   LONG TERM GOALS: Target date: 03/07/2022  (Remove Blue Hyperlink)  Pt will improve FOTO to target score of 66 to display perceived improvements in ability to complete ADL's.  Baseline: Eval=54 Goal status: INITIAL  2.  Pt will decrease quick DASH score by at least 8% in order to demonstrate clinically significant reduction in disability.  Baseline: EVAL Goal status: INITIAL  3.  Pt will decrease worst pain as reported on NPRS by at least 3 points in order to demonstrate clinically significant reduction in pain.  Baseline: EVAL= 4/10 Goal status: INITIAL  4.  Pt will increase strength of  by at least 1/2 MMT grade in order to demonstrate improvement in strength and function  Baseline: EVAL= 4/5 with shoulder ABD Goal status: INITIAL PLAN:  PT FREQUENCY: 1-2x/week  PT DURATION: 12 weeks  PLANNED INTERVENTIONS: Therapeutic exercises,  Therapeutic activity, Patient/Family education, Self Care, Joint mobilization, Joint manipulation, Dry Needling, Electrical stimulation, Spinal manipulation, Spinal mobilization, Cryotherapy, Moist heat, Taping, and Manual therapy  PLAN FOR NEXT SESSION: Manual Therapy for ROM and progressive therex for ROM and strengthening.  Lewis Moccasin, PT 12/27/2021, 1:18 PM  OUTPATIENT PHYSICAL THERAPY SHOULDER EVALUATION   Patient Name: Shelby Henson MRN: [id number] DOB:Jul 05, 1970, 51 y.o., female Today's Date: 12/27/2021   PT End of Session - 12/27/21 1200     Visit Number 3    Number of Visits 24    Date for PT Re-Evaluation 03/07/22    Progress Note Due on Visit 10    PT Start Time 1018    PT Stop Time 1053    PT Time Calculation (min) 35 min    Activity Tolerance Patient tolerated treatment well;Patient limited by pain    Behavior During Therapy WFL for tasks assessed/performed             Past Medical History:  Diagnosis Date   Anemia    Asthma    well controlled   Complication of anesthesia    Depression    GERD (gastroesophageal reflux disease)    occ   Migraine    Morbid obesity ([place])    Osteoarthritis    PONV (postoperative nausea and vomiting)    with c-section x 1   Past Surgical History:  Procedure Laterality Date   ABLATION     BREAST CYST ASPIRATION Left    CESAREAN SECTION     CESAREAN SECTION     CESAREAN SECTION     COLONOSCOPY N/A 09/22/2020   Procedure: COLONOSCOPY;  Surgeon: Virgel Manifold, MD;  Location: [facility];  Service:  Endoscopy;  Laterality: N/A;   HIP SURGERY  10/2018   LAPAROSCOPY  2018   LEEP  2005   benign pathology; Dr. Laurey Morale   SUPRACERVICAL ABDOMINAL HYSTERECTOMY  2010   leio/AUB; Dr. Laurey Morale   TOTAL HIP ARTHROPLASTY Right 11/12/2018   Procedure: TOTAL HIP ARTHROPLASTY ANTERIOR APPROACH;  Surgeon: Lovell Sheehan, MD;  Location: [facility];  Service: Orthopedics;  Laterality: Right;   Patient Active Problem List   Diagnosis Date Noted   Rotator cuff impingement syndrome, right 09/29/2021   Biceps tendinitis, right 09/29/2021   Meralgia paresthetica of right side 03/29/2021   Psoas tendinitis, right hip 03/29/2021   History of total right hip arthroplasty 03/29/2021   Status post total replacement of right hip 02/19/2021   Meralgia paresthetica of right side 02/19/2021   Polyp of ascending colon 02/19/2021   Polyp of sigmoid colon 02/19/2021   Special screening for malignant neoplasms, colon    Polyp of ascending colon    Polyp of sigmoid colon    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 03/23/2020   Pain in right hip 01/12/2019   Vitamin D deficiency 02/25/2018   Chronic pain 10/24/2015   Hot flashes 10/24/2015   Migraine without aura and without status migrainosus, not intractable 88/41/6606   Metabolic syndrome 30/16/0109   Dyslipidemia 10/24/2015   Allergic rhinitis, seasonal 05/17/2015   Asthma, mild intermittent 08/08/2014   Chronic insomnia 08/08/2014   DDD (degenerative disc disease), lumbar 08/08/2014   Mood disorder ([place]) 08/08/2014   Gastro-esophageal reflux disease without esophagitis 08/08/2014   H/O total hysterectomy 08/08/2014   Irritable bowel syndrome with diarrhea 08/08/2014   Osteoarthritis of right hip 08/08/2014   Adult BMI 30+ 08/08/2014   SBO (spina bifida occulta) 08/08/2014    PCP: Dr. Steele Sizer  REFERRING PROVIDER: Dr. Rosette Reveal  REFERRING DIAG:  bicep tendonitis/shoulder   THERAPY DIAG:  Muscle weakness  (generalized)  Right shoulder pain, unspecified chronicity  Limited range of motion (ROM) of shoulder  Rationale  for Evaluation and Treatment: Rehabilitation  ONSET DATE: 6 months ago- around March 2023  SUBJECTIVE:                                                                                                                                                                                      SUBJECTIVE STATEMENT: Patient reports approx 6 month history of Right arm/shoulder issues- Stating mostly "clicking" and "Popping" with reaching and moving right arm. She report the arm as more irritating than painful but states would like to be able to raise or move her arm normally without any issues.    PERTINENT HISTORY: Per office visit note from 11/24/2021 from Dr. Collins Scotland:  Patient presents for follow-up to right shoulder pain in the setting of rotator cuff impingement and biceps tendinitis noted on 09/29/2021 during her last visit. This had been ongoing for 6-8 months at that time.  Has been compliant with the meloxicam, noted improvement though not resolution.     PAIN:  Are you having pain? Yes: NPRS scale: 3-4/10 Pain location: R lateral shoulder/arm Pain description: Clicking and popping; some  Aggravating factors: moving arm Relieving factors: Rest, Meloxicam  PRECAUTIONS: None  WEIGHT BEARING RESTRICTIONS: No  FALLS:  Has patient fallen in last 6 months? 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   NEXT MD VISIT:   OBJECTIVE:   DIAGNOSTIC FINDINGS:  EXAM: RIGHT SHOULDER - 2+ VIEW   COMPARISON:  None Available.   FINDINGS: There is no evidence of fracture or dislocation. There is  no evidence of arthropathy or other focal bone abnormality. Soft tissues are unremarkable.   IMPRESSION: Negative.     Electronically Signed   By: Dorise Bullion III M.D.   On: 08/08/2021 19:05  PATIENT SURVEYS:  Shelby Henson   and FOTO 54  COGNITION: Overall cognitive status: Within functional limits for tasks assessed     SENSATION: WFL  POSTURE: Forward head  Protracted shoulders  UPPER EXTREMITY ROM:   Active ROM Right eval Left eval  Shoulder flexion 170 WNL throughout Left UE  Shoulder extension 64   Shoulder abduction 85*   Shoulder adduction NT   Shoulder internal rotation 75   Shoulder external rotation 80*   Elbow flexion Full   Elbow extension Full   Wrist flexion WNL   Wrist extension WNL   Wrist ulnar deviation    Wrist  radial deviation    Wrist pronation    Wrist supination    (Blank rows = not tested)  UPPER EXTREMITY MMT:  MMT Right eval Left eval  Shoulder flexion    Shoulder extension    Shoulder abduction    Shoulder adduction    Shoulder internal rotation    Shoulder external rotation    Middle trapezius    Lower trapezius    Elbow flexion    Elbow extension    Wrist flexion    Wrist extension    Wrist ulnar deviation    Wrist radial deviation    Wrist pronation    Wrist supination    Grip strength (lbs)    (Blank rows = not tested)  SHOULDER SPECIAL TESTS: Impingement tests: Neer impingement test: positive  and Hawkins/Kennedy impingement test: positive  SLAP lesions: Crank test: negative Instability tests: Sulcus sign: negative Rotator cuff assessment: Drop arm test: negative and Full can test: positive  Biceps assessment: Yergason's test: negative and Speed's test: positive   JOINT MOBILITY TESTING:  WNL  PALPATION:  + Tenderness located around anteriolateral  right shoulder and down into muscle belly of biceps.   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 female  who was seen today for physical therapy evaluation and treatment for biceps tendonitis and right shoulder pain. PT examination reveals deficits in intermittent R shoulder pain, limited active ROM, and  Right UE muscle weakness. Pt will benefit from PT services to address deficits in strength, mobility, and pain in order to return to full function at home and work with less shoulder pain.  OBJECTIVE IMPAIRMENTS: decreased ROM, decreased strength, impaired UE functional use, and pain.   ACTIVITY LIMITATIONS: carrying, lifting, and reach over head  PARTICIPATION LIMITATIONS: occupation and yard work  PERSONAL FACTORS: 1 comorbidity: OA  are also affecting patient's functional outcome.   REHAB POTENTIAL: Good  CLINICAL DECISION MAKING: Stable/uncomplicated  EVALUATION COMPLEXITY: Low   GOALS: Goals reviewed with patient? Yes  SHORT TERM GOALS: Target date: 01/24/2022  (Remove Blue Hyperlink)  Pt will be independent with HEP in order to improve strength and decrease pain in order to improve pain-free function at home and work. Baseline: EVAL- No formal  HEP in place Goal status: INITIAL   LONG TERM GOALS: Target date: 03/07/2022  (Remove Blue Hyperlink)  Pt will improve FOTO to target score of 66 to display perceived improvements in ability to complete ADL's.  Baseline:  Eval=54 Goal status: INITIAL  2.  Pt will decrease quick DASH score by at least 8% in order to demonstrate clinically significant reduction in disability.  Baseline: EVAL Goal status: INITIAL  3.  Pt will decrease worst pain as reported on NPRS by at least 3 points in order to demonstrate clinically significant reduction in pain.  Baseline: EVAL= 4/10 Goal status: INITIAL  4.  Pt will increase strength of  by at least 1/2 MMT grade in order to demonstrate improvement in strength and function  Baseline: EVAL= 4/5 with shoulder ABD Goal status: INITIAL PLAN:  PT FREQUENCY: 1-2x/week  PT DURATION: 12 weeks  PLANNED INTERVENTIONS: Therapeutic exercises, Therapeutic activity, Patient/Family education, Self Care, Joint mobilization, Joint manipulation, Dry Needling, Electrical stimulation, Spinal manipulation, Spinal mobilization, Cryotherapy, Moist heat, Taping, and Manual therapy  PLAN FOR NEXT SESSION: Manual Therapy for ROM and progressive therex for ROM and strengthening.                                                                                                                                                                                                                  Lewis Moccasin, PT 12/27/2021, 1:18 PM

## 2021-12-28 ENCOUNTER — Encounter: Payer: Self-pay | Admitting: Family Medicine

## 2022-01-03 ENCOUNTER — Ambulatory Visit: Payer: No Typology Code available for payment source | Attending: Family Medicine

## 2022-01-03 ENCOUNTER — Encounter: Payer: Self-pay | Admitting: Family Medicine

## 2022-01-03 DIAGNOSIS — M25511 Pain in right shoulder: Secondary | ICD-10-CM | POA: Insufficient documentation

## 2022-01-03 DIAGNOSIS — R262 Difficulty in walking, not elsewhere classified: Secondary | ICD-10-CM

## 2022-01-03 DIAGNOSIS — R2689 Other abnormalities of gait and mobility: Secondary | ICD-10-CM

## 2022-01-03 DIAGNOSIS — M6281 Muscle weakness (generalized): Secondary | ICD-10-CM | POA: Insufficient documentation

## 2022-01-03 DIAGNOSIS — M25619 Stiffness of unspecified shoulder, not elsewhere classified: Secondary | ICD-10-CM | POA: Insufficient documentation

## 2022-01-03 DIAGNOSIS — R208 Other disturbances of skin sensation: Secondary | ICD-10-CM

## 2022-01-03 DIAGNOSIS — M25551 Pain in right hip: Secondary | ICD-10-CM

## 2022-01-03 NOTE — Therapy (Signed)
OUTPATIENT PHYSICAL THERAPY SHOULDER TREATMENT    Patient Name: Shelby Henson MRN: [id number] DOB:1970/08/02, 51 y.o., female Today's Date: 01/04/2022   PT End of Session - 01/03/22 1301     Visit Number 4    Number of Visits 24    Date for PT Re-Evaluation 03/07/22    Progress Note Due on Visit 10    PT Start Time 1301    PT Stop Time 1332    PT Time Calculation (min) 31 min    Activity Tolerance Patient tolerated treatment well;Patient limited by pain    Behavior During Therapy WFL for tasks assessed/performed              Past Medical History:  Diagnosis Date   Anemia    Asthma    well controlled   Complication of anesthesia    Depression    GERD (gastroesophageal reflux disease)    occ   Migraine    Morbid obesity (HCC)    Osteoarthritis    PONV (postoperative nausea and vomiting)    with c-section x 1   Past Surgical History:  Procedure Laterality Date   ABLATION     BREAST CYST ASPIRATION Left    CESAREAN SECTION     CESAREAN SECTION     CESAREAN SECTION     COLONOSCOPY N/A 09/22/2020   Procedure: COLONOSCOPY;  Surgeon: Pasty Spillers, MD;  Location: [facility];  Service: Endoscopy;  Laterality: N/A;   HIP SURGERY  10/2018   LAPAROSCOPY  2018   LEEP  2005   benign pathology; Dr. Luella Cook   SUPRACERVICAL ABDOMINAL HYSTERECTOMY  2010   leio/AUB; Dr. Luella Cook   TOTAL HIP ARTHROPLASTY Right 11/12/2018   Procedure: TOTAL HIP ARTHROPLASTY ANTERIOR APPROACH;  Surgeon: Lyndle Herrlich, MD;  Location: [facility];  Service: Orthopedics;  Laterality: Right;   Patient Active Problem List   Diagnosis Date Noted   Rotator cuff impingement syndrome, right 09/29/2021   Biceps tendinitis, right 09/29/2021   Meralgia paresthetica of right side 03/29/2021   Psoas tendinitis, right hip 03/29/2021   History of total right hip arthroplasty 03/29/2021   Status post total replacement of right hip 02/19/2021   Meralgia paresthetica of right side 02/19/2021    Polyp of ascending colon 02/19/2021   Polyp of sigmoid colon 02/19/2021   Special screening for malignant neoplasms, colon    Polyp of ascending colon    Polyp of sigmoid colon    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 03/23/2020   Pain in right hip 01/12/2019   Vitamin D deficiency 02/25/2018   Chronic pain 10/24/2015   Hot flashes 10/24/2015   Migraine without aura and without status migrainosus, not intractable 10/24/2015   Metabolic syndrome 10/24/2015   Dyslipidemia 10/24/2015   Allergic rhinitis, seasonal 05/17/2015   Asthma, mild intermittent 08/08/2014   Chronic insomnia 08/08/2014   DDD (degenerative disc disease), lumbar 08/08/2014   Mood disorder (HCC) 08/08/2014   Gastro-esophageal reflux disease without esophagitis 08/08/2014   H/O total hysterectomy 08/08/2014   Irritable bowel syndrome with diarrhea 08/08/2014   Osteoarthritis of right hip 08/08/2014   Adult BMI 30+ 08/08/2014   SBO (spina bifida occulta) 08/08/2014    PCP: Dr. Alba Cory  REFERRING PROVIDER: Dr. Joseph Berkshire  REFERRING DIAG:  bicep tendonitis/shoulder   THERAPY DIAG:  Muscle weakness (generalized)  Right shoulder pain, unspecified chronicity  Limited range of motion (ROM) of shoulder  Rationale for Evaluation and Treatment: Rehabilitation  ONSET  DATE: 6 months ago- around March 2023  SUBJECTIVE:                                                                                                                                                                                      SUBJECTIVE STATEMENT:  Patient reports continued right shoulder pain with mobility- states even hurting when performing her exercises.    PERTINENT HISTORY: Per office visit note from 11/24/2021 from Dr. Rudene Christians:  Patient presents for follow-up to right shoulder pain in the setting of rotator cuff impingement and biceps tendinitis noted on 09/29/2021 during her last visit. This  had been ongoing for 6-8 months at that time.  Has been compliant with the meloxicam, noted improvement though not resolution.     PAIN:  Are you having pain? Yes: NPRS scale: 3-4/10 Pain location: R lateral shoulder/arm Pain description: Clicking and popping; some  Aggravating factors: moving arm Relieving factors: Rest, Meloxicam  PRECAUTIONS: None  WEIGHT BEARING RESTRICTIONS: No  FALLS:  Has patient fallen in last 6 months? 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   NEXT MD VISIT:   OBJECTIVE:   DIAGNOSTIC FINDINGS:  EXAM: RIGHT SHOULDER - 2+ VIEW   COMPARISON:  None Available.   FINDINGS: There is no evidence of fracture or dislocation. There is no evidence of arthropathy or other focal bone abnormality. Soft tissues are unremarkable.   IMPRESSION: Negative.     Electronically Signed   By: Gerome Sam III M.D.   On: 08/08/2021 19:05  PATIENT SURVEYS:  Neldon Mc   and FOTO 54  COGNITION: Overall cognitive status: Within functional limits for tasks assessed     SENSATION: WFL  POSTURE: Forward head  Protracted shoulders  UPPER EXTREMITY ROM:   Active ROM Right eval Left eval  Shoulder flexion 170 WNL throughout Left UE  Shoulder extension 64   Shoulder abduction 85*   Shoulder adduction NT   Shoulder internal rotation 75   Shoulder external rotation 80*   Elbow flexion Full   Elbow extension Full   Wrist flexion WNL   Wrist extension WNL   Wrist ulnar deviation    Wrist radial deviation    Wrist pronation    Wrist supination    (Blank rows = not tested)  UPPER EXTREMITY MMT:  MMT Right eval Left eval  Shoulder flexion    Shoulder extension  Shoulder abduction    Shoulder adduction    Shoulder internal rotation    Shoulder external rotation    Middle trapezius    Lower trapezius    Elbow flexion    Elbow extension    Wrist flexion    Wrist extension    Wrist ulnar deviation    Wrist radial deviation    Wrist pronation    Wrist supination    Grip strength (lbs)    (Blank rows = not tested)  SHOULDER SPECIAL TESTS: Impingement tests: Neer impingement test: positive  and Hawkins/Kennedy impingement test: positive  SLAP lesions: Crank test: negative Instability tests: Sulcus sign: negative Rotator cuff assessment: Drop arm test: negative and Full can test: positive  Biceps assessment: Yergason's test: negative and Speed's test: positive   JOINT MOBILITY TESTING:  WNL  PALPATION:  + Tenderness located around anteriolateral  right shoulder and down into muscle belly of biceps.   TODAY'S TREATMENT:                                                                                                                                         DATE:   Manual Therapy: (supine)   PROM to right shoulder in supine (full ROM) some pain/catching with flex/abd with ER. Attempted to stay within painfree ROM  Grade 2-3 PA/AP/Inf GH joint mobs- 30 bouts multiple angles of flex/scap/abd and later in ER.    THEREX:   Instructed in below 90 deg shoulder strengthening:  Reviewed importance of pain-free mobility/strengthening and instructed patient not raise arm past point of pain.  Shoulder abd using RTB x 10 reps (as painfree as possible- yet patient experiencing more pain today with minimal shoulder elevation) so reverted back to shoulder isometrics at wall- Reviewed for proper technique including arm positioning.  Shoulder ER and IR using RTB (no pan with shoulder IR yet some pain with shoulder ER) so reverted back to isometrics at wall.  Shoulder retraction and posterior rolls x 10 reps  (VC for posture)  Scap rectraction with RTB x12  reps Shoulder ext with RTB x 12  Shoulder ER (infraspinatus/teres minor) - stand at wall with pillow x 5 sec hold x 10 Shoulder ABD (supraspinatus) - stand at wall with pillow x 5 sec hold x 10   TDN Treatment: (Unbilled)  Patient consent: After explanation of TDN Rationale, Procedures, outcomes, and potential side effects, patient verbalized consent to TDN treatment. Region/Dx: Right shoulder pain/Bicep tendonitis Muscles Treated: Right lateral deltoid region x 2 # of needles used: 2 (0.30x 30)  Post treatment pain/response: Patient reports no pain with technique;  No immediate soreness from needling Post treatment Instructions: Patient instructed to expect mild to moderate muscle soreness this evening and tomorrow. Patient instructed to continued prescribed home exercise program. Patient verbalized understanding of these instructions.   PATIENT EDUCATION: Education details: Exercise technique and HEP Person educated: Patient Education method:  Explanation, Demonstration, and Verbal cues Education comprehension: verbalized understanding, returned demonstration, and needs further education  HOME EXERCISE PROGRAM: Access Code: [email or web address] URL: [email or web address] Date: 12/27/2021 Prepared by: Maureen Ralphs  Exercises - Standing Single Arm Shoulder Abduction with Resistance  - 1 x daily - 3 x weekly - 3 sets - 10 reps - 2 hold - Shoulder Internal Rotation with Resistance  - 1 x daily - 3 x weekly - 3 sets - 10 reps - 2 hold - Shoulder External Rotation with Anchored Resistance  - 1 x daily - 3 x weekly - 3 sets - 10 reps - 2 hold   Access Code: [email or web address] URL: [email or web address] Date: 12/19/2021 Prepared by: Maureen Ralphs  Exercises - Standing Isometric Shoulder Abduction with Doorway  - 1 x daily - 3 x weekly - 3 sets - 10 reps - Standing Isometric Shoulder External Rotation with Doorway  - 1 x daily - 3 x weekly - 3 sets - 10 reps  EVAL:  ROM using wand for Shoulder flex/abd/ER  ASSESSMENT:  CLINICAL IMPRESSION:  Patient continues to present with persistent Right ant/lat shoulder pain. Bicep region appears better with no palpable triggers but patient experiencing more pain even with below 90 deg activity. Reverted some strengthening from resistive with bands back to isometrics to avoid impingement and patient performed better.  Pt will continue to benefit from PT services to address deficits in strength, UE mobility, and pain in order to return to full function at home and work with less shoulder pain.  OBJECTIVE IMPAIRMENTS: decreased ROM, decreased strength, impaired UE functional use, and pain.   ACTIVITY LIMITATIONS: carrying, lifting, and reach over head  PARTICIPATION LIMITATIONS: occupation and yard work  PERSONAL FACTORS: 1 comorbidity: OA  are also affecting patient's functional outcome.   REHAB POTENTIAL: Good  CLINICAL DECISION MAKING: Stable/uncomplicated  EVALUATION COMPLEXITY: Low   GOALS: Goals reviewed with patient? Yes  SHORT TERM GOALS: Target date: 01/24/2022  (Remove Blue Hyperlink)  Pt will be independent with HEP in order to improve strength and decrease pain in order to improve pain-free function at home and work. Baseline: EVAL- No formal HEP in place Goal status: INITIAL   LONG TERM GOALS: Target date: 03/07/2022  (Remove Blue Hyperlink)  Pt will improve FOTO to target score of 66 to display perceived improvements in ability to complete ADL's.  Baseline: Eval=54 Goal status: INITIAL  2.  Pt will decrease quick DASH score by at least 8% in order to demonstrate clinically significant reduction in disability.  Baseline: EVAL Goal status: INITIAL  3.  Pt will decrease worst pain as reported on NPRS by at least 3 points in order to demonstrate clinically significant reduction in pain.  Baseline: EVAL= 4/10 Goal status: INITIAL  4.  Pt will increase strength of  by at least 1/2 MMT  grade in order to demonstrate improvement in strength and function  Baseline: EVAL= 4/5 with shoulder ABD Goal status: INITIAL PLAN:  PT FREQUENCY: 1-2x/week  PT DURATION: 12 weeks  PLANNED INTERVENTIONS: Therapeutic exercises, Therapeutic activity, Patient/Family education, Self Care, Joint mobilization, Joint manipulation, Dry Needling, Electrical stimulation, Spinal manipulation, Spinal mobilization, Cryotherapy, Moist heat, Taping, and Manual therapy  PLAN FOR NEXT SESSION: Manual Therapy for ROM and progressive therex for ROM and strengthening.  Lenda Kelp, PT 01/04/2022, 9:50 AM  OUTPATIENT PHYSICAL THERAPY SHOULDER EVALUATION   Patient Name: Shelby Henson MRN: [id number] DOB:Apr 06, 1970, 51 y.o., female Today's Date: 01/04/2022   PT End of Session - 01/03/22 1301     Visit Number 4    Number of Visits 24    Date for PT Re-Evaluation 03/07/22    Progress Note Due on Visit 10    PT Start Time 1301    PT Stop Time 1332    PT Time Calculation (min) 31 min    Activity Tolerance Patient tolerated treatment well;Patient limited by pain    Behavior During Therapy WFL for tasks assessed/performed              Past Medical History:  Diagnosis Date   Anemia    Asthma    well controlled   Complication of anesthesia    Depression    GERD (gastroesophageal reflux disease)    occ   Migraine    Morbid obesity (HCC)    Osteoarthritis    PONV (postoperative nausea and vomiting)    with c-section x 1   Past Surgical History:  Procedure Laterality Date    ABLATION     BREAST CYST ASPIRATION Left    CESAREAN SECTION     CESAREAN SECTION     CESAREAN SECTION     COLONOSCOPY N/A 09/22/2020   Procedure: COLONOSCOPY;  Surgeon: Pasty Spillers, MD;  Location: [facility];  Service: Endoscopy;  Laterality: N/A;   HIP SURGERY  10/2018   LAPAROSCOPY  2018   LEEP  2005   benign pathology; Dr. Luella Cook   SUPRACERVICAL ABDOMINAL HYSTERECTOMY  2010   leio/AUB; Dr. Luella Cook   TOTAL HIP ARTHROPLASTY Right 11/12/2018   Procedure: TOTAL HIP ARTHROPLASTY ANTERIOR APPROACH;  Surgeon: Lyndle Herrlich, MD;  Location: [facility];  Service: Orthopedics;  Laterality: Right;   Patient Active Problem List   Diagnosis Date Noted   Rotator cuff impingement syndrome, right 09/29/2021   Biceps tendinitis, right 09/29/2021   Meralgia paresthetica of right side 03/29/2021   Psoas tendinitis, right hip 03/29/2021   History of total right hip arthroplasty 03/29/2021   Status post total replacement of right hip 02/19/2021   Meralgia paresthetica of right side 02/19/2021   Polyp of ascending colon 02/19/2021   Polyp of sigmoid colon 02/19/2021   Special screening for malignant neoplasms, colon    Polyp of ascending colon    Polyp of sigmoid colon    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 03/23/2020   Pain in right hip 01/12/2019   Vitamin D deficiency 02/25/2018   Chronic pain 10/24/2015   Hot flashes 10/24/2015   Migraine without aura and without status migrainosus, not intractable 10/24/2015   Metabolic syndrome 10/24/2015   Dyslipidemia 10/24/2015   Allergic rhinitis, seasonal 05/17/2015   Asthma, mild intermittent 08/08/2014   Chronic insomnia 08/08/2014   DDD (degenerative disc disease), lumbar 08/08/2014   Mood disorder (HCC) 08/08/2014   Gastro-esophageal reflux disease without esophagitis 08/08/2014   H/O total hysterectomy 08/08/2014   Irritable bowel syndrome with diarrhea 08/08/2014   Osteoarthritis of right hip  08/08/2014   Adult BMI 30+ 08/08/2014   SBO (spina bifida occulta) 08/08/2014    PCP: Dr. Alba Cory  REFERRING PROVIDER: Dr. Joseph Berkshire  REFERRING DIAG:  bicep tendonitis/shoulder   THERAPY DIAG:  Muscle weakness (generalized)  Right shoulder pain, unspecified chronicity  Limited range of motion (ROM) of shoulder  Rationale for Evaluation and Treatment: Rehabilitation  ONSET DATE: 6 months ago- around March 2023  SUBJECTIVE:                                                                                                                                                                                      SUBJECTIVE STATEMENT: Patient reports approx 6 month history of Right arm/shoulder issues- Stating mostly "clicking" and "Popping" with reaching and moving right arm. She report the arm as more irritating than painful but states would like to be able to raise or move her arm normally without any issues.    PERTINENT HISTORY: Per office visit note from 11/24/2021 from Dr. Rudene Christians:  Patient presents for follow-up to right shoulder pain in the setting of rotator cuff impingement and biceps tendinitis noted on 09/29/2021 during her last visit. This had been ongoing for 6-8 months at that time.  Has been compliant with the meloxicam, noted improvement though not resolution.     PAIN:  Are you having pain? Yes: NPRS scale: 3-4/10 Pain location: R lateral shoulder/arm Pain description: Clicking and popping; some  Aggravating factors: moving arm Relieving factors: Rest, Meloxicam  PRECAUTIONS: None  WEIGHT BEARING RESTRICTIONS: No  FALLS:  Has patient fallen in last 6 months? 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   NEXT MD VISIT:   OBJECTIVE:   DIAGNOSTIC FINDINGS:  EXAM: RIGHT SHOULDER - 2+ VIEW   COMPARISON:  None Available.   FINDINGS: There is no evidence of fracture or dislocation. There is no evidence of arthropathy or other focal bone abnormality. Soft tissues are unremarkable.   IMPRESSION: Negative.     Electronically Signed   By: Gerome Sam III M.D.   On: 08/08/2021 19:05  PATIENT SURVEYS:  Neldon Mc   and FOTO 54  COGNITION: Overall cognitive status: Within functional limits for tasks assessed     SENSATION: WFL  POSTURE: Forward head  Protracted shoulders  UPPER EXTREMITY ROM:   Active ROM Right eval Left eval  Shoulder flexion 170 WNL throughout Left UE  Shoulder extension 64   Shoulder abduction 85*   Shoulder adduction NT   Shoulder internal rotation 75   Shoulder external rotation 80*   Elbow flexion Full   Elbow extension Full   Wrist flexion WNL   Wrist extension WNL   Wrist ulnar deviation  Wrist radial deviation    Wrist pronation    Wrist supination    (Blank rows = not tested)  UPPER EXTREMITY MMT:  MMT Right eval Left eval  Shoulder flexion    Shoulder extension    Shoulder abduction    Shoulder adduction    Shoulder internal rotation    Shoulder external rotation    Middle trapezius    Lower trapezius    Elbow flexion    Elbow extension    Wrist flexion    Wrist extension    Wrist ulnar deviation    Wrist radial deviation    Wrist pronation    Wrist supination    Grip strength (lbs)    (Blank rows = not tested)  SHOULDER SPECIAL TESTS: Impingement tests: Neer impingement test: positive  and Hawkins/Kennedy impingement test: positive  SLAP lesions: Crank test: negative Instability tests: Sulcus sign: negative Rotator cuff assessment: Drop arm test: negative and Full can test: positive  Biceps assessment: Yergason's test: negative and Speed's test: positive   JOINT MOBILITY  TESTING:  WNL  PALPATION:  + Tenderness located around anteriolateral  right shoulder and down into muscle belly of biceps.   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 female  who was seen today for physical therapy evaluation and treatment for biceps tendonitis and right shoulder pain. PT examination reveals deficits in intermittent R shoulder pain, limited active ROM, and  Right UE muscle weakness. Pt will benefit from PT services to address deficits in strength, mobility, and pain in order to return to full function at home and work with less shoulder pain.  OBJECTIVE IMPAIRMENTS: decreased ROM, decreased strength, impaired UE functional use, and pain.   ACTIVITY LIMITATIONS: carrying, lifting, and reach over head  PARTICIPATION LIMITATIONS: occupation and yard work  PERSONAL FACTORS: 1 comorbidity: OA  are also affecting patient's functional outcome.   REHAB POTENTIAL: Good  CLINICAL DECISION MAKING: Stable/uncomplicated  EVALUATION COMPLEXITY: Low   GOALS: Goals reviewed with patient? Yes  SHORT TERM GOALS: Target date: 01/24/2022  (Remove Blue Hyperlink)  Pt will be independent with HEP in order to improve strength and decrease pain in order to improve pain-free function at home and work. Baseline: EVAL- No  formal HEP in place Goal status: INITIAL   LONG TERM GOALS: Target date: 03/07/2022  (Remove Blue Hyperlink)  Pt will improve FOTO to target score of 66 to display perceived improvements in ability to complete ADL's.  Baseline: Eval=54 Goal status: INITIAL  2.  Pt will decrease quick DASH score by at least 8% in order to demonstrate clinically significant reduction in disability.  Baseline: EVAL Goal status: INITIAL  3.  Pt will decrease worst pain as reported on NPRS by at least 3 points in order to demonstrate clinically significant reduction in pain.  Baseline: EVAL= 4/10 Goal status: INITIAL  4.  Pt will increase strength of  by at least 1/2 MMT grade in order to demonstrate improvement in strength and function  Baseline: EVAL= 4/5 with shoulder ABD Goal status: INITIAL PLAN:  PT FREQUENCY: 1-2x/week  PT DURATION: 12 weeks  PLANNED INTERVENTIONS: Therapeutic exercises, Therapeutic activity, Patient/Family education, Self Care, Joint mobilization, Joint manipulation, Dry Needling, Electrical stimulation, Spinal manipulation, Spinal mobilization, Cryotherapy, Moist heat, Taping, and Manual therapy  PLAN FOR NEXT SESSION: Manual Therapy for ROM and progressive therex for ROM and strengthening.                                                                                                                                                                                                                  Lenda Kelp, PT 01/04/2022, 9:50 AM

## 2022-01-03 NOTE — Telephone Encounter (Signed)
Please review.  KP

## 2022-01-04 NOTE — Progress Notes (Signed)
Name: Shelby Henson   MRN: [id number]    DOB: [birth year]   Date:01/05/2022       Progress Note  Subjective  Chief Complaint  Follow Up  HPI  GERD: she was doing well, off PPI, however she is back on NSAID's and is having symptoms again, she has an epigastric discomfort intermittently that seems to be present after she eats, she has been taking otc zantac and is working well for her at this time   Asthma mild persistent: . She had one flare earlier this year, she had mostly chest tightness used nebulizer more often and symptoms resolved. Currently no cough, wheezing or SOB. She uses pulmicort and albuterol prn    Metabolic Syndrome/Obesity : She denies polyphagia, polydipsia or polyuria. She used to be morbidly obese with BMI above 35 with co-morbidities , GERD, OA of right hip, metabolic syndrome , however lost weight on Ozempic . Weight used to be in the 170 lbs range, however in 2021 it spiked to 193 lbs, we started her on Ozempic lost down to 170 lbs but Covid and did not feel well and stopped medication, weight went up again to 180 lbs and we resumed medication in Dec 2022. We gave ger [facility] in April 23 and her weight was 177 lbs she states the 1 mg dose made her unable to eat for weeks and she decided to stop taking it. She is now following a keto diet and lost 3 lbs since October.    Chronic right hip pain: s/p replacement but still on modified duty at work and has daily pain, no longer seeing pain clinic, but sees Dr. Harlow Mares once a year. She takes Meloxicam daily, but currently taking it mostly for her thumb tendinitis and arthritis, advised to switch to Voltaren.  Pain on her hip is described as burning, dull and aching, constant, still requires her to shift positions frequently to control symptoms. Seeing neurologist now , had MRI and has mild arthritis lumbar spine, and Dr. Manuella Ghazi is ordering NCS and EMG it showed meralgia paresthetica . She is on higher dose of gabapentin also had three   injections done by Dr. Zigmund Daniel but failed the treatment and she was going to [facility] for ablation but insurance denied coverage    Insomnia: She was on Trazodone but we tried giving her Quiviviq but she was not able to fill it. Lunesta and Trazodone  made her moody, she also tried Temazepam. Discussed seroquel again and she will try prn    Mood swings :she has been on Pritiq for many years, she has a family history of bipolar She denies side effects, no anxiety symptoms, phq was negative Discussed consider Vraylar , but she states doing well at this time   Low B12: last level was low and she will get B12 injection today    Migraine headache: she states symptoms usually intense , associated with nausea, photosensitivity, throbbing like and sometimes dull. Last visit her headaches were getting to be weekly again and we switched Emgality no episodes since.   Patient Active Problem List   Diagnosis Date Noted   Morbid obesity ([place]) 01/05/2022   Rotator cuff impingement syndrome, right 09/29/2021   Biceps tendinitis, right 09/29/2021   Meralgia paresthetica of right side 03/29/2021   Psoas tendinitis, right hip 03/29/2021   History of total right hip arthroplasty 03/29/2021   Status post total replacement of right hip 02/19/2021   Meralgia paresthetica of right side 02/19/2021   Polyp of  ascending colon 02/19/2021   Polyp of sigmoid colon 02/19/2021   Special screening for malignant neoplasms, colon    Polyp of ascending colon    Polyp of sigmoid colon    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 03/23/2020   Pain in right hip 01/12/2019   Vitamin D deficiency 02/25/2018   Chronic pain 10/24/2015   Hot flashes 10/24/2015   Migraine without aura and without status migrainosus, not intractable 17/51/0258   Metabolic syndrome 52/77/8242   Dyslipidemia 10/24/2015   Allergic rhinitis, seasonal 05/17/2015   Asthma, mild intermittent 08/08/2014   Chronic insomnia 08/08/2014    DDD (degenerative disc disease), lumbar 08/08/2014   Mood disorder ([place]) 08/08/2014   Gastro-esophageal reflux disease without esophagitis 08/08/2014   H/O total hysterectomy 08/08/2014   Irritable bowel syndrome with diarrhea 08/08/2014   Osteoarthritis of right hip 08/08/2014   Adult BMI 30+ 08/08/2014   SBO (spina bifida occulta) 08/08/2014    Past Surgical History:  Procedure Laterality Date   ABLATION     BREAST CYST ASPIRATION Left    CESAREAN SECTION     CESAREAN SECTION     CESAREAN SECTION     COLONOSCOPY N/A 09/22/2020   Procedure: COLONOSCOPY;  Surgeon: Virgel Manifold, MD;  Location: [facility];  Service: Endoscopy;  Laterality: N/A;   HIP SURGERY  10/2018   LAPAROSCOPY  2018   LEEP  2005   benign pathology; Dr. Laurey Morale   SUPRACERVICAL ABDOMINAL HYSTERECTOMY  2010   leio/AUB; Dr. Laurey Morale   TOTAL HIP ARTHROPLASTY Right 11/12/2018   Procedure: TOTAL HIP ARTHROPLASTY ANTERIOR APPROACH;  Surgeon: Lovell Sheehan, MD;  Location: [facility] OR[facility];  Service: Orthopedics;  Laterality: Right;    Family History  Problem Relation Age of Onset   Depression Mother    Bipolar disorder Brother    Colon cancer Maternal Grandfather        not sure of age   Breast cancer Neg Hx     Social History   Tobacco Use   Smoking status: Never   Smokeless tobacco: Never  Substance Use Topics   Alcohol use: Yes    Alcohol/week: 0.0 standard drinks of alcohol    Comment: occasionally     Current Outpatient Medications:    albuterol (PROVENTIL) (2.5 MG/3ML) 0.083% nebulizer solution, USE ONE VIAL VIA NEBULIZER EVERY 6 HOURS AS NEEDED FOR WHEEZING OR SHORTNESS OF BREATH., Disp: 75 mL, Rfl: 2   budesonide (PULMICORT) 0.5 MG/2ML nebulizer solution, TAKE 2 MLS BY NEBULIZATION 2 TIMES DAILY., Disp: 60 mL, Rfl: 5   Cholecalciferol (VITAMIN D) 50 MCG (2000 UT) CAPS, Take 1 capsule by mouth daily at 12 noon., Disp: , Rfl:    diclofenac Sodium (VOLTAREN) 1 % GEL, Apply 2 g topically 4  (four) times daily., Disp: 100 g, Rfl: 1   famotidine (PEPCID) 40 MG tablet, Take 1 tablet (40 mg total) by mouth daily., Disp: 90 tablet, Rfl: 1   gabapentin (NEURONTIN) 300 MG capsule, Take 3 capsules (900 mg total) by mouth at bedtime, Disp: 180 capsule, Rfl: 3   Galcanezumab-gnlm (EMGALITY) 120 MG/ML SOAJ, Inject 1 mL into the skin every 30 (thirty) days., Disp: 1 mL, Rfl: 5   lidocaine (LIDODERM) 5 %, Place 2 patches onto the skin daily. Remove & Discard patch within 12 hours or as directed by MD, Disp: 60 patch, Rfl: 0   meloxicam (MOBIC) 7.5 MG tablet, Take 1-2 tablets by mouth daily as needed for pain., Disp: 120  tablet, Rfl: 0   methocarbamol (ROBAXIN) 500 MG tablet, Take 1 tablet (500 mg total) by mouth 4 (four) times daily., Disp: 90 tablet, Rfl: 0   QUEtiapine (SEROQUEL) 25 MG tablet, Take 1 tablet (25 mg total) by mouth at bedtime., Disp: 30 tablet, Rfl: 0   desvenlafaxine (PRISTIQ) 50 MG 24 hr tablet, TAKE 1 TABLET BY MOUTH DAILY., Disp: 90 tablet, Rfl: 1  Allergies  Allergen Reactions   Celecoxib Hives   Prednisone     intolerance, makes her angry. Makes her angry   Topamax [Topiramate]     Mood changes   Zolpidem    Codeine Rash   Penicillins Rash    Did it involve swelling of the face/tongue/throat, SOB, or low BP? No Did it involve sudden or severe rash/hives, skin peeling, or any reaction on the inside of your mouth or nose? Yes Did you need to seek medical attention at a hospital or doctor's office? Yes When did it last happen? More than 20 years ago       If all above answers are "NO", may proceed with cephalosporin use.     I personally reviewed active problem list, medication list, allergies, family history, social history, health maintenance with the patient/caregiver today.   ROS  Constitutional: Negative for fever or weight change.  Respiratory: Negative for cough and shortness of breath.   Cardiovascular: Negative for chest pain or palpitations.   Gastrointestinal: Negative for abdominal pain, no bowel changes.  Musculoskeletal: positive  for gait problem or joint swelling.  Skin: Negative for rash.  Neurological: Negative for dizziness or headache.  No other specific complaints in a complete review of systems (except as listed in HPI above).   Objective  Vitals:   01/05/22 0837  BP: 124/72  Pulse: 96  Resp: 16  SpO2: 97%  Weight: 171 lb (77.6 kg)  Height: '4\' 11"'$  (1.499 m)    Body mass index is 34.54 kg/m.  Physical Exam  Constitutional: Patient appears well-developed and well-nourished. Obese  No distress.  HEENT: head atraumatic, normocephalic, pupils equal and reactive to light, neck supple Cardiovascular: Normal rate, regular rhythm and normal heart sounds.  No murmur heard. No BLE edema. Pulmonary/Chest: Effort normal and breath sounds normal. No respiratory distress. Abdominal: Soft.  There is no tenderness. Psychiatric: Patient has a normal mood and affect. behavior is normal. Judgment and thought content normal.   PHQ2/9:    01/05/2022    8:36 AM 11/03/2021   10:46 AM 08/25/2021    9:34 AM 08/03/2021    2:16 PM 06/02/2021    2:35 PM  Depression screen PHQ 2/9  Decreased Interest 0 0 0 0 0  Down, Depressed, Hopeless 0 0 0 0 0  PHQ - 2 Score 0 0 0 0 0  Altered sleeping '3 3 3 3 3  '$ Tired, decreased energy 0 0 0 1 0  Change in appetite 0 0 3 0 0  Feeling bad or failure about yourself  0 0 0 0 0  Trouble concentrating 0 1 0 1 1  Moving slowly or fidgety/restless 0 0 0 0 0  Suicidal thoughts 0 0 0 0 0  PHQ-9 Score '3 4 6 5 4  '$ Difficult doing work/chores    Not difficult at all Not difficult at all    phq 9 is negative   Fall Risk:    01/05/2022    8:36 AM 11/03/2021   10:46 AM 08/25/2021    9:29 AM 08/03/2021  2:16 PM 06/02/2021    2:36 PM  Fall Risk   Falls in the past year? 0 0 0 0 0  Number falls in past yr: 0 0 0  0  Injury with Fall? 0 0 0 0 0  Risk for fall due to : No Fall Risks No Fall Risks No  Fall Risks  No Fall Risks  Follow up Falls prevention discussed Falls prevention discussed Falls prevention discussed  Falls evaluation completed      Functional Status Survey: Is the patient deaf or have difficulty hearing?: No Does the patient have difficulty seeing, even when wearing glasses/contacts?: No Does the patient have difficulty concentrating, remembering, or making decisions?: No Does the patient have difficulty walking or climbing stairs?: Yes Does the patient have difficulty dressing or bathing?: No Does the patient have difficulty doing errands alone such as visiting a doctor's office or shopping?: No    Assessment & Plan  1. Mood disorder (HCC)  - desvenlafaxine (PRISTIQ) 50 MG 24 hr tablet; TAKE 1 TABLET BY MOUTH DAILY.  Dispense: 90 tablet; Refill: 1  2. Hot flashes  - desvenlafaxine (PRISTIQ) 50 MG 24 hr tablet; TAKE 1 TABLET BY MOUTH DAILY.  Dispense: 90 tablet; Refill: 1  3. Obesity (BMI 30.0-34.9)  Discussed with the patient the risk posed by an increased BMI. Discussed importance of portion control, calorie counting and at least 150 minutes of physical activity weekly. Avoid sweet beverages and drink more water. Eat at least 6 servings of fruit and vegetables daily    4. B12 deficiency  B12 today   5. Migraine without aura and without status migrainosus, not intractable  Doing well on Emgality   6. Rotator cuff impingement syndrome, right  Keep follow up with Dr. Zigmund Daniel  7. Vitamin D deficiency  Resume vitamin D supplementation   8. Gastroesophageal reflux disease without esophagitis  - famotidine (PEPCID) 40 MG tablet; Take 1 tablet (40 mg total) by mouth daily.  Dispense: 90 tablet; Refill: 1  9. Mild intermittent asthma in adult without complication   10. Chronic insomnia  - QUEtiapine (SEROQUEL) 25 MG tablet; Take 1 tablet (25 mg total) by mouth at bedtime.  Dispense: 30 tablet; Refill: 0

## 2022-01-05 ENCOUNTER — Encounter: Payer: Self-pay | Admitting: Family Medicine

## 2022-01-05 ENCOUNTER — Ambulatory Visit (INDEPENDENT_AMBULATORY_CARE_PROVIDER_SITE_OTHER): Payer: No Typology Code available for payment source | Admitting: Family Medicine

## 2022-01-05 ENCOUNTER — Other Ambulatory Visit: Payer: Self-pay

## 2022-01-05 VITALS — BP 124/72 | HR 96 | Resp 16 | Ht 59.0 in | Wt 171.0 lb

## 2022-01-05 DIAGNOSIS — R232 Flushing: Secondary | ICD-10-CM | POA: Diagnosis not present

## 2022-01-05 DIAGNOSIS — E669 Obesity, unspecified: Secondary | ICD-10-CM | POA: Diagnosis not present

## 2022-01-05 DIAGNOSIS — F39 Unspecified mood [affective] disorder: Secondary | ICD-10-CM

## 2022-01-05 DIAGNOSIS — E559 Vitamin D deficiency, unspecified: Secondary | ICD-10-CM

## 2022-01-05 DIAGNOSIS — K219 Gastro-esophageal reflux disease without esophagitis: Secondary | ICD-10-CM

## 2022-01-05 DIAGNOSIS — G43009 Migraine without aura, not intractable, without status migrainosus: Secondary | ICD-10-CM

## 2022-01-05 DIAGNOSIS — J4520 Mild intermittent asthma, uncomplicated: Secondary | ICD-10-CM

## 2022-01-05 DIAGNOSIS — E538 Deficiency of other specified B group vitamins: Secondary | ICD-10-CM

## 2022-01-05 DIAGNOSIS — M7541 Impingement syndrome of right shoulder: Secondary | ICD-10-CM

## 2022-01-05 DIAGNOSIS — F5104 Psychophysiologic insomnia: Secondary | ICD-10-CM

## 2022-01-05 MED ORDER — CYANOCOBALAMIN 1000 MCG/ML IJ SOLN
1000.0000 ug | Freq: Once | INTRAMUSCULAR | Status: AC
  Administered 2022-01-05: 1000 ug via INTRAMUSCULAR

## 2022-01-05 MED ORDER — QUETIAPINE FUMARATE 25 MG PO TABS
25.0000 mg | ORAL_TABLET | Freq: Every day | ORAL | 0 refills | Status: DC
  Filled 2022-01-05 – 2022-01-17 (×2): qty 30, 30d supply, fill #0

## 2022-01-05 MED ORDER — DESVENLAFAXINE SUCCINATE ER 50 MG PO TB24
50.0000 mg | ORAL_TABLET | Freq: Every day | ORAL | 1 refills | Status: DC
  Filled 2022-01-05 – 2022-02-09 (×3): qty 90, 90d supply, fill #0
  Filled 2022-05-10: qty 90, 90d supply, fill #1

## 2022-01-05 MED ORDER — FAMOTIDINE 40 MG PO TABS
40.0000 mg | ORAL_TABLET | Freq: Every day | ORAL | 1 refills | Status: DC
  Filled 2022-01-05 – 2022-01-17 (×2): qty 90, 90d supply, fill #0
  Filled 2022-05-10: qty 90, 90d supply, fill #1

## 2022-01-08 ENCOUNTER — Ambulatory Visit: Payer: No Typology Code available for payment source

## 2022-01-08 DIAGNOSIS — M25511 Pain in right shoulder: Secondary | ICD-10-CM

## 2022-01-08 DIAGNOSIS — M25619 Stiffness of unspecified shoulder, not elsewhere classified: Secondary | ICD-10-CM

## 2022-01-08 DIAGNOSIS — M6281 Muscle weakness (generalized): Secondary | ICD-10-CM | POA: Diagnosis not present

## 2022-01-08 NOTE — Therapy (Signed)
OUTPATIENT PHYSICAL THERAPY SHOULDER TREATMENT    Patient Name: Shelby Henson MRN: [id number] DOB:May 17, 1970, 51 y.o., female Today's Date: 01/08/2022   PT End of Session - 01/08/22 1103     Visit Number 5    Number of Visits 24    Date for PT Re-Evaluation 03/07/22    Progress Note Due on Visit 10    PT Start Time 1104    PT Stop Time 1135    PT Time Calculation (min) 31 min    Activity Tolerance Patient tolerated treatment well;Patient limited by pain    Behavior During Therapy WFL for tasks assessed/performed               Past Medical History:  Diagnosis Date   Anemia    Asthma    well controlled   Complication of anesthesia    Depression    GERD (gastroesophageal reflux disease)    occ   Migraine    Morbid obesity ([place])    Osteoarthritis    PONV (postoperative nausea and vomiting)    with c-section x 1   Past Surgical History:  Procedure Laterality Date   ABLATION     BREAST CYST ASPIRATION Left    CESAREAN SECTION     CESAREAN SECTION     CESAREAN SECTION     COLONOSCOPY N/A 09/22/2020   Procedure: COLONOSCOPY;  Surgeon: Virgel Manifold, MD;  Location: [facility];  Service: Endoscopy;  Laterality: N/A;   HIP SURGERY  10/2018   LAPAROSCOPY  2018   LEEP  2005   benign pathology; Dr. Laurey Morale   SUPRACERVICAL ABDOMINAL HYSTERECTOMY  2010   leio/AUB; Dr. Laurey Morale   TOTAL HIP ARTHROPLASTY Right 11/12/2018   Procedure: TOTAL HIP ARTHROPLASTY ANTERIOR APPROACH;  Surgeon: Lovell Sheehan, MD;  Location: [facility];  Service: Orthopedics;  Laterality: Right;   Patient Active Problem List   Diagnosis Date Noted   Morbid obesity ([place]) 01/05/2022   Rotator cuff impingement syndrome, right 09/29/2021   Biceps tendinitis, right 09/29/2021   Meralgia paresthetica of right side 03/29/2021   Psoas tendinitis, right hip 03/29/2021   History of total right hip arthroplasty 03/29/2021   Status post total replacement of right hip 02/19/2021   Meralgia  paresthetica of right side 02/19/2021   Polyp of ascending colon 02/19/2021   Polyp of sigmoid colon 02/19/2021   Special screening for malignant neoplasms, colon    Polyp of ascending colon    Polyp of sigmoid colon    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 03/23/2020   Pain in right hip 01/12/2019   Vitamin D deficiency 02/25/2018   Chronic pain 10/24/2015   Hot flashes 10/24/2015   Migraine without aura and without status migrainosus, not intractable 81/44/8185   Metabolic syndrome 63/14/9702   Dyslipidemia 10/24/2015   Allergic rhinitis, seasonal 05/17/2015   Asthma, mild intermittent 08/08/2014   Chronic insomnia 08/08/2014   DDD (degenerative disc disease), lumbar 08/08/2014   Mood disorder ([place]) 08/08/2014   Gastro-esophageal reflux disease without esophagitis 08/08/2014   H/O total hysterectomy 08/08/2014   Irritable bowel syndrome with diarrhea 08/08/2014   Osteoarthritis of right hip 08/08/2014   Adult BMI 30+ 08/08/2014   SBO (spina bifida occulta) 08/08/2014    PCP: Dr. Steele Sizer  REFERRING PROVIDER: Dr. Rosette Reveal  REFERRING DIAG:  bicep tendonitis/shoulder   THERAPY DIAG:  Muscle weakness (generalized)  Right shoulder pain, unspecified chronicity  Limited range of motion (ROM) of shoulder  Rationale  for Evaluation and Treatment: Rehabilitation  ONSET DATE: 6 months ago- around March 2023  SUBJECTIVE:                                                                                                                                                                                      SUBJECTIVE STATEMENT:  Patient reports continued right shoulder pain and stated she was able to discuss with her MD and going to have a cortizone injection on Thursday. States even painful with all exercises including below shoulder level and most movement.     PERTINENT HISTORY: Per office visit note from 11/24/2021 from Dr. Collins [place]:   Patient presents for follow-up to right shoulder pain in the setting of rotator cuff impingement and biceps tendinitis noted on 09/29/2021 during her last visit. This had been ongoing for 6-8 months at that time.  Has been compliant with the meloxicam, noted improvement though not resolution.     PAIN:  Are you having pain? Yes: NPRS scale: 3-4/10 Pain location: R lateral shoulder/arm Pain description: Clicking and popping; some  Aggravating factors: moving arm Relieving factors: Rest, Meloxicam  PRECAUTIONS: None  WEIGHT BEARING RESTRICTIONS: No  FALLS:  Has patient fallen in last 6 months? 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   NEXT MD VISIT:   OBJECTIVE:   DIAGNOSTIC FINDINGS:  EXAM: RIGHT SHOULDER - 2+ VIEW   COMPARISON:  None Available.   FINDINGS: There is no evidence of fracture or dislocation. There is no evidence of arthropathy or other focal bone abnormality. Soft tissues are unremarkable.   IMPRESSION: Negative.     Electronically Signed   By: Dorise Bullion III M.D.   On: 08/08/2021 19:05  PATIENT SURVEYS:  [name]   and FOTO 54  COGNITION: Overall cognitive status: Within functional limits for tasks assessed     SENSATION: WFL  POSTURE: Forward head  Protracted shoulders  UPPER EXTREMITY ROM:   Active ROM Right eval Left eval  Shoulder flexion 170 WNL throughout Left UE  Shoulder extension 64   Shoulder abduction 85*   Shoulder adduction NT   Shoulder internal rotation 75   Shoulder external rotation 80*   Elbow flexion Full   Elbow extension Full   Wrist flexion WNL   Wrist extension WNL   Wrist ulnar deviation    Wrist radial deviation     Wrist  pronation    Wrist supination    (Blank rows = not tested)  UPPER EXTREMITY MMT:  MMT Right eval Left eval  Shoulder flexion    Shoulder extension    Shoulder abduction    Shoulder adduction    Shoulder internal rotation    Shoulder external rotation    Middle trapezius    Lower trapezius    Elbow flexion    Elbow extension    Wrist flexion    Wrist extension    Wrist ulnar deviation    Wrist radial deviation    Wrist pronation    Wrist supination    Grip strength (lbs)    (Blank rows = not tested)  SHOULDER SPECIAL TESTS: Impingement tests: Neer impingement test: positive  and Hawkins/Kennedy impingement test: positive  SLAP lesions: Crank test: negative Instability tests: Sulcus sign: negative Rotator cuff assessment: Drop arm test: negative and Full can test: positive  Biceps assessment: Yergason's test: negative and Speed's test: positive   JOINT MOBILITY TESTING:  WNL  PALPATION:  + Tenderness located around anteriolateral  right shoulder and down into muscle belly of biceps.   TODAY'S TREATMENT:                                                                                                                                         DATE:   Manual Therapy: (supine)   PROM to right shoulder in supine (full ROM) some pain/catching with flex/abd with ER. Attempted to stay within painfree ROM -yet patient much more painful with any motion around 90 deg or above Grade 2-3 PA/AP/Inf GH joint mobs- 30 bouts multiple angles of flex/scap/abd and later in ER.  Application of biofreeze to right shoulder/lateral arm/bicep region   TDN Treatment: Lysle Morales)  Patient consent: After explanation of TDN Rationale, Procedures, outcomes, and potential side effects, patient verbalized consent to TDN treatment. Region/Dx: Right shoulder pain/Bicep tendonitis Muscles Treated: Right lateral deltoid region x 2 # of needles used: 2 (0.30x 30)  Post treatment pain/response:  Patient reports no pain with technique;  No immediate soreness from needling Post treatment Instructions: Patient instructed to expect mild to moderate muscle soreness this evening and tomorrow. Patient instructed to continued prescribed home exercise program. Patient verbalized understanding of these instructions.   PATIENT EDUCATION: Education details: Exercise technique and HEP Person educated: Patient Education method: Explanation, Demonstration, and Verbal cues Education comprehension: verbalized understanding, returned demonstration, and needs further education  HOME EXERCISE PROGRAM: Access Code: [email or web address] URL: [email or web address] Date: 12/27/2021 Prepared by: Sande Brothers  Exercises - Standing Single Arm Shoulder Abduction with Resistance  - 1 x daily - 3 x weekly - 3 sets - 10 reps - 2 hold - Shoulder Internal Rotation with Resistance  - 1 x daily - 3 x weekly - 3 sets - 10 reps - 2 hold - Shoulder External Rotation with Anchored Resistance  - 1 x  daily - 3 x weekly - 3 sets - 10 reps - 2 hold   Access Code: [email or web address] URL: [email or web address] Date: 12/19/2021 Prepared by: Sande Brothers  Exercises - Standing Isometric Shoulder Abduction with Doorway  - 1 x daily - 3 x weekly - 3 sets - 10 reps - Standing Isometric Shoulder External Rotation with Doorway  - 1 x daily - 3 x weekly - 3 sets - 10 reps  EVAL: ROM using wand for Shoulder flex/abd/ER  ASSESSMENT:  CLINICAL IMPRESSION:  Patient continues to present with persistent Right ant/lat shoulder pain and has not responded to pain free rotator cuff strengthening. Overall- pain is no longer in bicep region but more in upper ant shoulder and lateral Upper arm. Patient instructed to halt all therex that is painful and to receive shoulder injection later this week. Will wait until next week to see her again and follow up as appropriate.  Pt will continue to benefit from PT services to  address deficits in strength, UE mobility, and pain in order to return to full function at home and work with less shoulder pain.  OBJECTIVE IMPAIRMENTS: decreased ROM, decreased strength, impaired UE functional use, and pain.   ACTIVITY LIMITATIONS: carrying, lifting, and reach over head  PARTICIPATION LIMITATIONS: occupation and yard work  PERSONAL FACTORS: 1 comorbidity: OA  are also affecting patient's functional outcome.   REHAB POTENTIAL: Good  CLINICAL DECISION MAKING: Stable/uncomplicated  EVALUATION COMPLEXITY: Low   GOALS: Goals reviewed with patient? Yes  SHORT TERM GOALS: Target date: 01/24/2022  (Remove Blue Hyperlink)  Pt will be independent with HEP in order to improve strength and decrease pain in order to improve pain-free function at home and work. Baseline: EVAL- No formal HEP in place Goal status: INITIAL   LONG TERM GOALS: Target date: 03/07/2022  (Remove Blue Hyperlink)  Pt will improve FOTO to target score of 66 to display perceived improvements in ability to complete ADL's.  Baseline: Eval=54 Goal status: INITIAL  2.  Pt will decrease quick DASH score by at least 8% in order to demonstrate clinically significant reduction in disability.  Baseline: EVAL Goal status: INITIAL  3.  Pt will decrease worst pain as reported on NPRS by at least 3 points in order to demonstrate clinically significant reduction in pain.  Baseline: EVAL= 4/10 Goal status: INITIAL  4.  Pt will increase strength of  by at least 1/2 MMT grade in order to demonstrate improvement in strength and function  Baseline: EVAL= 4/5 with shoulder ABD Goal status: INITIAL PLAN:  PT FREQUENCY: 1-2x/week  PT DURATION: 12 weeks  PLANNED INTERVENTIONS: Therapeutic exercises, Therapeutic activity, Patient/Family education, Self Care, Joint mobilization, Joint manipulation, Dry Needling, Electrical stimulation, Spinal manipulation, Spinal mobilization, Cryotherapy, Moist heat, Taping, and  Manual therapy  PLAN FOR NEXT SESSION: Manual Therapy for ROM and progressive therex for ROM and strengthening.  Lewis Moccasin, PT 01/08/2022, 1:39 PM  OUTPATIENT PHYSICAL THERAPY SHOULDER EVALUATION   Patient Name: Shelby Henson MRN: [id number] DOB:09-05-1970, 51 y.o., female Today's Date: 01/08/2022   PT End of Session - 01/08/22 1103     Visit Number 5    Number of Visits 24    Date for PT Re-Evaluation 03/07/22    Progress Note Due on Visit 10    PT Start Time 1104    PT Stop Time 1135    PT Time Calculation (min) 31 min    Activity Tolerance Patient tolerated treatment well;Patient limited by pain    Behavior During Therapy WFL for tasks assessed/performed               Past Medical History:  Diagnosis Date   Anemia    Asthma    well controlled   Complication of anesthesia    Depression    GERD (gastroesophageal reflux disease)    occ   Migraine    Morbid obesity ([place])    Osteoarthritis    PONV (postoperative nausea and vomiting)    with c-section x 1   Past Surgical History:  Procedure Laterality Date   ABLATION     BREAST CYST ASPIRATION Left    CESAREAN SECTION     CESAREAN SECTION     CESAREAN SECTION     COLONOSCOPY N/A 09/22/2020   Procedure: COLONOSCOPY;  Surgeon: Virgel Manifold, MD;  Location: [facility];  Service: Endoscopy;  Laterality: N/A;   HIP SURGERY  10/2018   LAPAROSCOPY  2018   LEEP  2005   benign pathology; Dr. Laurey Morale   SUPRACERVICAL ABDOMINAL HYSTERECTOMY  2010   leio/AUB; Dr. Laurey Morale   TOTAL HIP  ARTHROPLASTY Right 11/12/2018   Procedure: TOTAL HIP ARTHROPLASTY ANTERIOR APPROACH;  Surgeon: Lovell Sheehan, MD;  Location: [facility];  Service: Orthopedics;  Laterality: Right;   Patient Active Problem List   Diagnosis Date Noted   Morbid obesity ([place]) 01/05/2022   Rotator cuff impingement syndrome, right 09/29/2021   Biceps tendinitis, right 09/29/2021   Meralgia paresthetica of right side 03/29/2021   Psoas tendinitis, right hip 03/29/2021   History of total right hip arthroplasty 03/29/2021   Status post total replacement of right hip 02/19/2021   Meralgia paresthetica of right side 02/19/2021   Polyp of ascending colon 02/19/2021   Polyp of sigmoid colon 02/19/2021   Special screening for malignant neoplasms, colon    Polyp of ascending colon    Polyp of sigmoid colon    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 03/23/2020   Pain in right hip 01/12/2019   Vitamin D deficiency 02/25/2018   Chronic pain 10/24/2015   Hot flashes 10/24/2015   Migraine without aura and without status migrainosus, not intractable 40/98/1191   Metabolic syndrome 47/82/9562   Dyslipidemia 10/24/2015   Allergic rhinitis, seasonal 05/17/2015   Asthma, mild intermittent 08/08/2014   Chronic insomnia 08/08/2014   DDD (degenerative disc disease), lumbar 08/08/2014   Mood disorder ([place]) 08/08/2014   Gastro-esophageal reflux disease without esophagitis 08/08/2014   H/O total hysterectomy 08/08/2014   Irritable bowel syndrome with diarrhea 08/08/2014   Osteoarthritis of right hip 08/08/2014   Adult BMI 30+ 08/08/2014   SBO (spina bifida occulta) 08/08/2014    PCP: Dr. Steele Sizer  REFERRING PROVIDER: Dr. Rosette Reveal  REFERRING DIAG:  bicep tendonitis/shoulder   THERAPY DIAG:  Muscle weakness (generalized)  Right shoulder pain, unspecified chronicity  Limited range  of motion (ROM) of shoulder  Rationale for Evaluation and Treatment: Rehabilitation  ONSET DATE:  6 months ago- around March 2023  SUBJECTIVE:                                                                                                                                                                                      SUBJECTIVE STATEMENT: Patient reports approx 6 month history of Right arm/shoulder issues- Stating mostly "clicking" and "Popping" with reaching and moving right arm. She report the arm as more irritating than painful but states would like to be able to raise or move her arm normally without any issues.    PERTINENT HISTORY: Per office visit note from 11/24/2021 from Dr. Collins [place]:  Patient presents for follow-up to right shoulder pain in the setting of rotator cuff impingement and biceps tendinitis noted on 09/29/2021 during her last visit. This had been ongoing for 6-8 months at that time.  Has been compliant with the meloxicam, noted improvement though not resolution.     PAIN:  Are you having pain? Yes: NPRS scale: 3-4/10 Pain location: R lateral shoulder/arm Pain description: Clicking and popping; some  Aggravating factors: moving arm Relieving factors: Rest, Meloxicam  PRECAUTIONS: None  WEIGHT BEARING RESTRICTIONS: No  FALLS:  Has patient fallen in last 6 months? 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   NEXT MD VISIT:   OBJECTIVE:   DIAGNOSTIC FINDINGS:  EXAM: RIGHT SHOULDER - 2+ VIEW   COMPARISON:  None Available.   FINDINGS: There is no evidence of fracture or dislocation. There is no evidence of arthropathy or other focal bone abnormality. Soft tissues are unremarkable.   IMPRESSION: Negative.     Electronically Signed   By: Dorise Bullion III  M.D.   On: 08/08/2021 19:05  PATIENT SURVEYS:  [name]   and FOTO 54  COGNITION: Overall cognitive status: Within functional limits for tasks assessed     SENSATION: WFL  POSTURE: Forward head  Protracted shoulders  UPPER EXTREMITY ROM:   Active ROM Right eval Left eval  Shoulder flexion 170 WNL throughout Left UE  Shoulder extension 64   Shoulder abduction 85*   Shoulder adduction NT   Shoulder internal rotation 75   Shoulder external rotation 80*   Elbow flexion Full   Elbow extension Full   Wrist flexion WNL   Wrist extension WNL  Wrist ulnar deviation    Wrist radial deviation    Wrist pronation    Wrist supination    (Blank rows = not tested)  UPPER EXTREMITY MMT:  MMT Right eval Left eval  Shoulder flexion    Shoulder extension    Shoulder abduction    Shoulder adduction    Shoulder internal rotation    Shoulder external rotation    Middle trapezius    Lower trapezius    Elbow flexion    Elbow extension    Wrist flexion    Wrist extension    Wrist ulnar deviation    Wrist radial deviation    Wrist pronation    Wrist supination    Grip strength (lbs)    (Blank rows = not tested)  SHOULDER SPECIAL TESTS: Impingement tests: Neer impingement test: positive  and Hawkins/Kennedy impingement test: positive  SLAP lesions: Crank test: negative Instability tests: Sulcus sign: negative Rotator cuff assessment: Drop arm test: negative and Full can test: positive  Biceps assessment: Yergason's test: negative and Speed's test: positive   JOINT MOBILITY TESTING:  WNL  PALPATION:  + Tenderness located around anteriolateral  right shoulder and down into muscle belly of biceps.   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 female  who was seen today for physical therapy evaluation and treatment for biceps tendonitis and right shoulder pain. PT examination reveals deficits in intermittent R shoulder pain, limited active ROM, and  Right UE muscle weakness. Pt will benefit from PT services to address deficits in strength, mobility, and pain in order to return to full function at home and work with less shoulder pain.  OBJECTIVE IMPAIRMENTS: decreased ROM, decreased strength, impaired UE functional use, and pain.   ACTIVITY LIMITATIONS: carrying, lifting, and reach over head  PARTICIPATION LIMITATIONS: occupation and yard work  PERSONAL FACTORS: 1 comorbidity: OA  are also affecting patient's functional outcome.   REHAB POTENTIAL: Good  CLINICAL DECISION MAKING: Stable/uncomplicated  EVALUATION COMPLEXITY: Low   GOALS: Goals reviewed with patient? Yes  SHORT TERM GOALS: Target date: 01/24/2022  (Remove Blue Hyperlink)  Pt will be independent with HEP in order to improve strength and decrease pain in order to improve pain-free function at  home and work. Baseline: EVAL- No formal HEP in place Goal status: INITIAL   LONG TERM GOALS: Target date: 03/07/2022  (Remove Blue Hyperlink)  Pt will improve FOTO to target score of 66 to display perceived improvements in ability to complete ADL's.  Baseline: Eval=54 Goal status: INITIAL  2.  Pt will decrease quick DASH score by at least 8% in order to demonstrate clinically significant reduction in disability.  Baseline:  EVAL Goal status: INITIAL  3.  Pt will decrease worst pain as reported on NPRS by at least 3 points in order to demonstrate clinically significant reduction in pain.  Baseline: EVAL= 4/10 Goal status: INITIAL  4.  Pt will increase strength of  by at least 1/2 MMT grade in order to demonstrate improvement in strength and function  Baseline: EVAL= 4/5 with shoulder ABD Goal status: INITIAL PLAN:  PT FREQUENCY: 1-2x/week  PT DURATION: 12 weeks  PLANNED INTERVENTIONS: Therapeutic exercises, Therapeutic activity, Patient/Family education, Self Care, Joint mobilization, Joint manipulation, Dry Needling, Electrical stimulation, Spinal manipulation, Spinal mobilization, Cryotherapy, Moist heat, Taping, and Manual therapy  PLAN FOR NEXT SESSION: Manual Therapy for ROM and progressive therex for ROM and strengthening.                                                                                                                                                                                                                  Lewis Moccasin, PT 01/08/2022, 1:39 PM

## 2022-01-11 ENCOUNTER — Inpatient Hospital Stay (INDEPENDENT_AMBULATORY_CARE_PROVIDER_SITE_OTHER): Payer: No Typology Code available for payment source | Admitting: Radiology

## 2022-01-11 ENCOUNTER — Encounter: Payer: Self-pay | Admitting: Family Medicine

## 2022-01-11 ENCOUNTER — Ambulatory Visit (INDEPENDENT_AMBULATORY_CARE_PROVIDER_SITE_OTHER): Payer: No Typology Code available for payment source | Admitting: Family Medicine

## 2022-01-11 VITALS — BP 120/80 | HR 90 | Ht 59.0 in | Wt 171.0 lb

## 2022-01-11 DIAGNOSIS — M7541 Impingement syndrome of right shoulder: Secondary | ICD-10-CM

## 2022-01-11 DIAGNOSIS — M7521 Bicipital tendinitis, right shoulder: Secondary | ICD-10-CM

## 2022-01-11 MED ORDER — TRIAMCINOLONE ACETONIDE 40 MG/ML IJ SUSP
80.0000 mg | Freq: Once | INTRAMUSCULAR | Status: AC
  Administered 2022-01-11: 80 mg via INTRAMUSCULAR

## 2022-01-11 NOTE — Patient Instructions (Addendum)
You have just been given a cortisone injection to reduce pain and inflammation. After the injection you may notice immediate relief of pain as a result of the Lidocaine. It is important to rest the area of the injection for 24 to 48 hours after the injection. There is a possibility of some temporary increased discomfort and swelling for up to 72 hours until the cortisone begins to work. If you do have pain, simply rest the joint and use ice. If you can tolerate over the counter medications, you can try Tylenol, Aleve, or Advil for added relief per package instructions. - Relative rest x 2 days then gradual return to activity - Can use meloxicam sparingly for shoulder pain on an as-needed basis - Restart physical therapy towards the end of next week and continue x 4-6 weeks - Contact us after PT if symptoms persist to discuss next steps - Follow-up as-needed

## 2022-01-12 ENCOUNTER — Other Ambulatory Visit: Payer: Self-pay

## 2022-01-12 NOTE — Progress Notes (Signed)
     Primary Care / Sports Medicine Office Visit  Patient Information:  Patient ID: Shelby Henson, female DOB: 24-May-1970 Age: 51 y.o. MRN: [id number]   Shelby Henson is a pleasant 51 y.o. female presenting with the following:  Chief Complaint  Patient presents with   Rotator cuff impingement syndrome, right    Vitals:   01/11/22 0945  BP: 120/80  Pulse: 90  SpO2: 98%   Vitals:   01/11/22 0945  Weight: 171 lb (77.6 kg)  Height: '4\' 11"'$  (1.499 m)   Body mass index is 34.54 kg/m.  No results found.   Independent interpretation of notes and tests performed by another provider:   None  Procedures performed:   Procedure:  Injection of right biceps tendon sheath under ultrasound guidance. Ultrasound guidance utilized for in-plane approach, effusion noted Samsung HS60 device utilized with permanent recording / reporting. Verbal informed consent obtained and verified. Skin prepped in a sterile fashion. Ethyl chloride for topical local analgesia.  Completed without difficulty and tolerated well. Medication: triamcinolone acetonide 40 mg/mL suspension for injection 1 mL total and 2 mL lidocaine 1% without epinephrine utilized for needle placement anesthetic Advised to contact for fevers/chills, erythema, induration, drainage, or persistent bleeding.  Procedure:  Injection of right subacromial space under ultrasound guidance. Ultrasound guidance utilized for in-plane approach, tendinopathy noted sonographically Samsung HS60 device utilized with permanent recording / reporting. Verbal informed consent obtained and verified. Skin prepped in a sterile fashion. Ethyl chloride for topical local analgesia.  Completed without difficulty and tolerated well. Medication: triamcinolone acetonide 40 mg/mL suspension for injection 1 mL total and 2 mL lidocaine 1% without epinephrine utilized for needle placement anesthetic Advised to contact for fevers/chills, erythema, induration,  drainage, or persistent bleeding.   Pertinent History, Exam, Impression, and Recommendations:   Problem List Items Addressed This Visit       Musculoskeletal and Integument   Rotator cuff impingement syndrome, right    Noted over weeks that has persisted despite regular meloxicam and PT. Notes reviewed from PT. Focal findings to supraspinatus and biceps tendon. Given treatments to date and recalcitrant symptoms, she did elect to proceed with ultrasound guided cortisone injections at subacromial and biceps tendon sheath locations. Additionally, from a medication management standpoint discussed wean and minimal usage of meloxicam. She is to restart PT and contact us at 6 weeks or sooner for any persistent symptoms without improvement.      Biceps tendinitis, right - Primary    See additional assessment(s) for plan details.      Relevant Orders   [place] LIMITED JOINT SPACE STRUCTURES UP RIGHT     Orders & Medications Meds ordered this encounter  Medications   triamcinolone acetonide (KENALOG-40) injection 80 mg   Orders Placed This Encounter  Procedures   [place] LIMITED JOINT SPACE STRUCTURES UP RIGHT     Return if symptoms worsen or fail to improve.     Montel Culver, MD, [facility]   Primary Care Sports Medicine Primary Care and Sports Medicine at [facility]

## 2022-01-12 NOTE — Assessment & Plan Note (Signed)
Noted over weeks that has persisted despite regular meloxicam and PT. Notes reviewed from PT. Focal findings to supraspinatus and biceps tendon. Given treatments to date and recalcitrant symptoms, she did elect to proceed with ultrasound guided cortisone injections at subacromial and biceps tendon sheath locations. Additionally, from a medication management standpoint discussed wean and minimal usage of meloxicam. She is to restart PT and contact us at 6 weeks or sooner for any persistent symptoms without improvement.

## 2022-01-12 NOTE — Assessment & Plan Note (Signed)
See additional assessment(s) for plan details. 

## 2022-01-17 ENCOUNTER — Other Ambulatory Visit: Payer: Self-pay

## 2022-01-23 ENCOUNTER — Encounter: Payer: Self-pay | Admitting: Family Medicine

## 2022-01-25 ENCOUNTER — Encounter: Payer: Self-pay | Admitting: Family Medicine

## 2022-01-26 ENCOUNTER — Ambulatory Visit: Payer: No Typology Code available for payment source | Admitting: Family Medicine

## 2022-01-30 ENCOUNTER — Ambulatory Visit: Payer: 59 | Attending: Family Medicine

## 2022-01-30 DIAGNOSIS — M25619 Stiffness of unspecified shoulder, not elsewhere classified: Secondary | ICD-10-CM | POA: Diagnosis not present

## 2022-01-30 DIAGNOSIS — M25511 Pain in right shoulder: Secondary | ICD-10-CM | POA: Insufficient documentation

## 2022-01-30 DIAGNOSIS — M6281 Muscle weakness (generalized): Secondary | ICD-10-CM | POA: Insufficient documentation

## 2022-01-30 NOTE — Therapy (Signed)
OUTPATIENT PHYSICAL THERAPY SHOULDER TREATMENT    Patient Name: Shelby Henson MRN: [id number] DOB:Feb 10, 1970, 52 y.o., female Today's Date: 01/31/2022   PT End of Session - 01/30/22 1523     Visit Number 6    Number of Visits 24    Date for PT Re-Evaluation 03/07/22    Progress Note Due on Visit 10    PT Start Time 1522    PT Stop Time 1552    PT Time Calculation (min) 30 min    Activity Tolerance Patient tolerated treatment well;Patient limited by pain    Behavior During Therapy WFL for tasks assessed/performed               Past Medical History:  Diagnosis Date   Anemia    Asthma    well controlled   Complication of anesthesia    Depression    GERD (gastroesophageal reflux disease)    occ   Migraine    Morbid obesity ([place])    Osteoarthritis    PONV (postoperative nausea and vomiting)    with c-section x 1   Past Surgical History:  Procedure Laterality Date   ABLATION     BREAST CYST ASPIRATION Left    CESAREAN SECTION     CESAREAN SECTION     CESAREAN SECTION     COLONOSCOPY N/A 09/22/2020   Procedure: COLONOSCOPY;  Surgeon: Virgel Manifold, MD;  Location: [facility];  Service: Endoscopy;  Laterality: N/A;   HIP SURGERY  10/2018   LAPAROSCOPY  2018   LEEP  2005   benign pathology; Dr. Laurey Morale   SUPRACERVICAL ABDOMINAL HYSTERECTOMY  2010   leio/AUB; Dr. Laurey Morale   TOTAL HIP ARTHROPLASTY Right 11/12/2018   Procedure: TOTAL HIP ARTHROPLASTY ANTERIOR APPROACH;  Surgeon: Lovell Sheehan, MD;  Location: [facility];  Service: Orthopedics;  Laterality: Right;   Patient Active Problem List   Diagnosis Date Noted   Morbid obesity ([place]) 01/05/2022   Rotator cuff impingement syndrome, right 09/29/2021   Biceps tendinitis, right 09/29/2021   Meralgia paresthetica of right side 03/29/2021   Psoas tendinitis, right hip 03/29/2021   History of total right hip arthroplasty 03/29/2021   Status post total replacement of right hip 02/19/2021   Meralgia  paresthetica of right side 02/19/2021   Polyp of ascending colon 02/19/2021   Polyp of sigmoid colon 02/19/2021   Special screening for malignant neoplasms, colon    Polyp of ascending colon    Polyp of sigmoid colon    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 03/23/2020   Pain in right hip 01/12/2019   Vitamin D deficiency 02/25/2018   Chronic pain 10/24/2015   Hot flashes 10/24/2015   Migraine without aura and without status migrainosus, not intractable 69/67/8938   Metabolic syndrome 11/14/5100   Dyslipidemia 10/24/2015   Allergic rhinitis, seasonal 05/17/2015   Asthma, mild intermittent 08/08/2014   Chronic insomnia 08/08/2014   DDD (degenerative disc disease), lumbar 08/08/2014   Mood disorder ([place]) 08/08/2014   Gastro-esophageal reflux disease without esophagitis 08/08/2014   H/O total hysterectomy 08/08/2014   Irritable bowel syndrome with diarrhea 08/08/2014   Osteoarthritis of right hip 08/08/2014   Adult BMI 30+ 08/08/2014   SBO (spina bifida occulta) 08/08/2014    PCP: Dr. Steele Sizer  REFERRING PROVIDER: Dr. Rosette Reveal  REFERRING DIAG:  bicep tendonitis/shoulder   THERAPY DIAG:  Muscle weakness (generalized)  Right shoulder pain, unspecified chronicity  Limited range of motion (ROM) of shoulder  Rationale  for Evaluation and Treatment: Rehabilitation  ONSET DATE: 6 months ago- around March 2023  SUBJECTIVE:                                                                                                                                                                                      SUBJECTIVE STATEMENT:  Patient reports some relief since shoulder injection- not as much clicking/popping. She does continue to endorse some right periscapular pain.   PERTINENT HISTORY: Per office visit note from 11/24/2021 from Dr. Collins [place]:  Patient presents for follow-up to right shoulder pain in the setting of rotator cuff impingement  and biceps tendinitis noted on 09/29/2021 during her last visit. This had been ongoing for 6-8 months at that time.  Has been compliant with the meloxicam, noted improvement though not resolution.     PAIN:  Are you having pain? Yes: NPRS scale: 4/10 Pain location: R posterior shoulder Pain description: Clicking and popping; some  Aggravating factors: moving arm Relieving factors: Rest, Meloxicam  PRECAUTIONS: None  WEIGHT BEARING RESTRICTIONS: No  FALLS:  Has patient fallen in last 6 months? 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   NEXT MD VISIT:   OBJECTIVE:   DIAGNOSTIC FINDINGS:  EXAM: RIGHT SHOULDER - 2+ VIEW   COMPARISON:  None Available.   FINDINGS: There is no evidence of fracture or dislocation. There is no evidence of arthropathy or other focal bone abnormality. Soft tissues are unremarkable.   IMPRESSION: Negative.     Electronically Signed   By: Dorise Bullion III M.D.   On: 08/08/2021 19:05  PATIENT SURVEYS:  [name]   and FOTO 54  COGNITION: Overall cognitive status: Within functional limits for tasks assessed     SENSATION: WFL  POSTURE: Forward head  Protracted shoulders  UPPER EXTREMITY ROM:   Active ROM Right eval Left eval  Shoulder flexion 170 WNL throughout Left UE  Shoulder extension 64   Shoulder abduction 85*   Shoulder adduction NT   Shoulder internal rotation 75   Shoulder external rotation 80*   Elbow flexion Full   Elbow extension Full   Wrist flexion WNL   Wrist extension WNL   Wrist ulnar deviation    Wrist radial deviation    Wrist pronation    Wrist supination    (Blank rows = not tested)  UPPER EXTREMITY MMT:  MMT  Right eval Left eval  Shoulder flexion    Shoulder extension    Shoulder abduction    Shoulder adduction    Shoulder internal rotation    Shoulder external rotation    Middle trapezius    Lower trapezius    Elbow flexion    Elbow extension    Wrist flexion    Wrist extension    Wrist ulnar deviation    Wrist radial deviation    Wrist pronation    Wrist supination    Grip strength (lbs)    (Blank rows = not tested)  SHOULDER SPECIAL TESTS: Impingement tests: Neer impingement test: positive  and Hawkins/Kennedy impingement test: positive  SLAP lesions: Crank test: negative Instability tests: Sulcus sign: negative Rotator cuff assessment: Drop arm test: negative and Full can test: positive  Biceps assessment: Yergason's test: negative and Speed's test: positive   JOINT MOBILITY TESTING:  WNL  PALPATION:  + Tenderness located around anteriolateral  right shoulder and down into muscle belly of biceps.   TODAY'S TREATMENT:                                                                                                                                         DATE:   Manual Therapy: (supine)   PROM to right shoulder in supine (full ROM)  with no significant pain.  Grade 2-3 PA/AP/Inf GH joint mobs- 30 bouts multiple angles of flex/scap/abd/ER Attempt STM to right posterior scapular region but patient too tender to tolerate   TDN Treatment: Lysle Morales)  Patient consent: After explanation of TDN Rationale, Procedures, outcomes, and potential side effects, patient verbalized consent to TDN treatment. Region/Dx: Right shoulder pain Muscles Treated: Right infraspinatus/ Teres minor/lat region x 2 # of needles used: 2 (0.30x 30)  Post treatment pain/response: Patient reports no pain with technique;  No immediate soreness from needling Post treatment Instructions: Patient instructed to expect mild to moderate muscle soreness this evening and tomorrow. Patient instructed to continued  prescribed home exercise program. Patient verbalized understanding of these instructions.  Therex:   - Isometrics- Shoulder ER- hold 5 sec x 10 reps -Isometrics- Shoulder ABD - hold 5 sec x 10 reps  - Gentle AAROM  with use of dowel- ER/IR/ABD x 15 reps.   PATIENT EDUCATION: Education details: Exercise technique and HEP Person educated: Patient Education method: Explanation, Demonstration, and Verbal cues Education comprehension: verbalized understanding, returned demonstration, and needs further education  HOME EXERCISE PROGRAM: Access Code: [email or web address] URL: [email or web address] Date: 12/27/2021 Prepared by: Sande Brothers  Exercises - Standing Single Arm Shoulder Abduction with Resistance  - 1 x daily - 3 x weekly - 3 sets - 10 reps - 2 hold - Shoulder Internal Rotation with Resistance  - 1 x daily - 3 x weekly - 3 sets - 10 reps - 2 hold - Shoulder External Rotation with Anchored Resistance  - 1 x daily -  3 x weekly - 3 sets - 10 reps - 2 hold   Access Code: [email or web address] URL: [email or web address] Date: 12/19/2021 Prepared by: Sande Brothers  Exercises - Standing Isometric Shoulder Abduction with Doorway  - 1 x daily - 3 x weekly - 3 sets - 10 reps - Standing Isometric Shoulder External Rotation with Doorway  - 1 x daily - 3 x weekly - 3 sets - 10 reps  EVAL: ROM using wand for Shoulder flex/abd/ER  ASSESSMENT:  CLINICAL IMPRESSION:  Patient presents today with improving pain and less popping/impinging presentation after injection with improved ROM. However, she presented with some increased overall posterior scap/shoulder pain. Perform DN to affect areas with no adverse effect and re-implementing pain free isometrics/AAROM shoulder activities with no report or observed pain.  Pt will continue to benefit from PT services to address deficits in strength, UE mobility, and pain in order to return to full function at home and work with less  shoulder pain.  OBJECTIVE IMPAIRMENTS: decreased ROM, decreased strength, impaired UE functional use, and pain.   ACTIVITY LIMITATIONS: carrying, lifting, and reach over head  PARTICIPATION LIMITATIONS: occupation and yard work  PERSONAL FACTORS: 1 comorbidity: OA  are also affecting patient's functional outcome.   REHAB POTENTIAL: Good  CLINICAL DECISION MAKING: Stable/uncomplicated  EVALUATION COMPLEXITY: Low   GOALS: Goals reviewed with patient? Yes  SHORT TERM GOALS: Target date: 01/24/2022  (Remove Blue Hyperlink)  Pt will be independent with HEP in order to improve strength and decrease pain in order to improve pain-free function at home and work. Baseline: EVAL- No formal HEP in place Goal status: INITIAL   LONG TERM GOALS: Target date: 03/07/2022  (Remove Blue Hyperlink)  Pt will improve FOTO to target score of 66 to display perceived improvements in ability to complete ADL's.  Baseline: Eval=54 Goal status: INITIAL  2.  Pt will decrease quick DASH score by at least 8% in order to demonstrate clinically significant reduction in disability.  Baseline: EVAL Goal status: INITIAL  3.  Pt will decrease worst pain as reported on NPRS by at least 3 points in order to demonstrate clinically significant reduction in pain.  Baseline: EVAL= 4/10 Goal status: INITIAL  4.  Pt will increase strength of  by at least 1/2 MMT grade in order to demonstrate improvement in strength and function  Baseline: EVAL= 4/5 with shoulder ABD Goal status: INITIAL PLAN:  PT FREQUENCY: 1-2x/week  PT DURATION: 12 weeks  PLANNED INTERVENTIONS: Therapeutic exercises, Therapeutic activity, Patient/Family education, Self Care, Joint mobilization, Joint manipulation, Dry Needling, Electrical stimulation, Spinal manipulation, Spinal mobilization, Cryotherapy, Moist heat, Taping, and Manual therapy  PLAN FOR NEXT SESSION: Manual Therapy for ROM and progressive therex for ROM and strengthening.  Lewis Moccasin, PT 01/31/2022, 1:01 PM  OUTPATIENT PHYSICAL THERAPY SHOULDER EVALUATION   Patient Name: Shelby Henson MRN: [id number] DOB:1970/11/22, 52 y.o., female Today's Date: 01/31/2022   PT End of Session - 01/30/22 1523     Visit Number 6    Number of Visits 24    Date for PT Re-Evaluation 03/07/22    Progress Note Due on Visit 10    PT Start Time 1522    PT Stop Time 1552    PT Time Calculation (min) 30 min    Activity Tolerance Patient tolerated treatment well;Patient limited by pain    Behavior During Therapy WFL for tasks assessed/performed               Past Medical History:  Diagnosis Date   Anemia    Asthma    well controlled   Complication of anesthesia    Depression    GERD (gastroesophageal reflux disease)    occ   Migraine    Morbid obesity ([place])    Osteoarthritis    PONV (postoperative nausea and vomiting)    with c-section x 1   Past Surgical History:  Procedure Laterality Date   ABLATION     BREAST CYST ASPIRATION Left    CESAREAN SECTION     CESAREAN SECTION     CESAREAN SECTION     COLONOSCOPY N/A 09/22/2020   Procedure: COLONOSCOPY;  Surgeon: Virgel Manifold, MD;  Location: [facility];  Service: Endoscopy;  Laterality: N/A;   HIP SURGERY  10/2018   LAPAROSCOPY  2018   LEEP  2005   benign pathology; Dr. Laurey Morale   SUPRACERVICAL ABDOMINAL HYSTERECTOMY  2010   leio/AUB; Dr. Laurey Morale   TOTAL HIP ARTHROPLASTY Right 11/12/2018   Procedure: TOTAL HIP ARTHROPLASTY ANTERIOR APPROACH;  Surgeon: Lovell Sheehan, MD;  Location:  [facility];  Service: Orthopedics;  Laterality: Right;   Patient Active Problem List   Diagnosis Date Noted   Morbid obesity ([place]) 01/05/2022   Rotator cuff impingement syndrome, right 09/29/2021   Biceps tendinitis, right 09/29/2021   Meralgia paresthetica of right side 03/29/2021   Psoas tendinitis, right hip 03/29/2021   History of total right hip arthroplasty 03/29/2021   Status post total replacement of right hip 02/19/2021   Meralgia paresthetica of right side 02/19/2021   Polyp of ascending colon 02/19/2021   Polyp of sigmoid colon 02/19/2021   Special screening for malignant neoplasms, colon    Polyp of ascending colon    Polyp of sigmoid colon    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 03/23/2020   Pain in right hip 01/12/2019   Vitamin D deficiency 02/25/2018   Chronic pain 10/24/2015   Hot flashes 10/24/2015   Migraine without aura and without status migrainosus, not intractable 22/03/5425   Metabolic syndrome 07/22/7626   Dyslipidemia 10/24/2015   Allergic rhinitis, seasonal 05/17/2015   Asthma, mild intermittent 08/08/2014   Chronic insomnia 08/08/2014   DDD (degenerative disc disease), lumbar 08/08/2014   Mood disorder ([place]) 08/08/2014   Gastro-esophageal reflux disease without esophagitis 08/08/2014   H/O total hysterectomy 08/08/2014   Irritable bowel syndrome with diarrhea 08/08/2014   Osteoarthritis of right hip 08/08/2014   Adult BMI 30+ 08/08/2014   SBO (spina bifida occulta) 08/08/2014    PCP: Dr. Steele Sizer  REFERRING PROVIDER: Dr. Rosette Reveal  REFERRING DIAG:  bicep tendonitis/shoulder   THERAPY DIAG:  Muscle weakness (generalized)  Right shoulder pain, unspecified chronicity  Limited range  of motion (ROM) of shoulder  Rationale for Evaluation and Treatment: Rehabilitation  ONSET DATE: 6 months ago- around March 2023  SUBJECTIVE:                                                                                                                                                                                       SUBJECTIVE STATEMENT: Patient reports approx 6 month history of Right arm/shoulder issues- Stating mostly "clicking" and "Popping" with reaching and moving right arm. She report the arm as more irritating than painful but states would like to be able to raise or move her arm normally without any issues.    PERTINENT HISTORY: Per office visit note from 11/24/2021 from Dr. Collins [place]:  Patient presents for follow-up to right shoulder pain in the setting of rotator cuff impingement and biceps tendinitis noted on 09/29/2021 during her last visit. This had been ongoing for 6-8 months at that time.  Has been compliant with the meloxicam, noted improvement though not resolution.     PAIN:  Are you having pain? Yes: NPRS scale: 3-4/10 Pain location: R lateral shoulder/arm Pain description: Clicking and popping; some  Aggravating factors: moving arm Relieving factors: Rest, Meloxicam  PRECAUTIONS: None  WEIGHT BEARING RESTRICTIONS: No  FALLS:  Has patient fallen in last 6 months? 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   NEXT MD VISIT:   OBJECTIVE:   DIAGNOSTIC FINDINGS:  EXAM: RIGHT SHOULDER - 2+ VIEW   COMPARISON:  None Available.   FINDINGS: There is no evidence of fracture or dislocation. There is no evidence of arthropathy or other focal bone abnormality. Soft tissues are unremarkable.   IMPRESSION: Negative.     Electronically Signed   By: Dorise Bullion III M.D.   On: 08/08/2021 19:05  PATIENT SURVEYS:  [name]   and FOTO 54  COGNITION: Overall cognitive status: Within  functional limits for tasks assessed     SENSATION: WFL  POSTURE: Forward head  Protracted shoulders  UPPER EXTREMITY ROM:   Active ROM Right eval Left eval  Shoulder flexion 170 WNL throughout Left UE  Shoulder extension 64   Shoulder abduction 85*   Shoulder adduction NT   Shoulder internal rotation 75   Shoulder external rotation 80*   Elbow flexion Full   Elbow extension Full   Wrist flexion WNL   Wrist extension WNL  Wrist ulnar deviation    Wrist radial deviation    Wrist pronation    Wrist supination    (Blank rows = not tested)  UPPER EXTREMITY MMT:  MMT Right eval Left eval  Shoulder flexion    Shoulder extension    Shoulder abduction    Shoulder adduction    Shoulder internal rotation    Shoulder external rotation    Middle trapezius    Lower trapezius    Elbow flexion    Elbow extension    Wrist flexion    Wrist extension    Wrist ulnar deviation    Wrist radial deviation    Wrist pronation    Wrist supination    Grip strength (lbs)    (Blank rows = not tested)  SHOULDER SPECIAL TESTS: Impingement tests: Neer impingement test: positive  and Hawkins/Kennedy impingement test: positive  SLAP lesions: Crank test: negative Instability tests: Sulcus sign: negative Rotator cuff assessment: Drop arm test: negative and Full can test: positive  Biceps assessment: Yergason's test: negative and Speed's test: positive   JOINT MOBILITY TESTING:  WNL  PALPATION:  + Tenderness located around anteriolateral  right shoulder and down into muscle belly of biceps.   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 female  who was seen today for physical therapy evaluation and treatment for biceps tendonitis and right shoulder pain. PT examination reveals deficits in intermittent R shoulder pain, limited active ROM, and  Right UE muscle weakness. Pt will benefit from PT services to address deficits in strength, mobility, and pain in order to return to full function at home and work with less shoulder pain.  OBJECTIVE IMPAIRMENTS: decreased ROM, decreased strength, impaired UE functional use, and pain.   ACTIVITY LIMITATIONS: carrying, lifting, and reach over head  PARTICIPATION LIMITATIONS: occupation and yard work  PERSONAL FACTORS: 1 comorbidity: OA  are also affecting patient's functional outcome.   REHAB POTENTIAL: Good  CLINICAL DECISION MAKING: Stable/uncomplicated  EVALUATION COMPLEXITY: Low   GOALS: Goals reviewed with patient? Yes  SHORT TERM GOALS: Target date: 01/24/2022  (Remove Blue Hyperlink)  Pt will be independent with HEP in order to improve strength and decrease pain in order to improve pain-free function at  home and work. Baseline: EVAL- No formal HEP in place Goal status: INITIAL   LONG TERM GOALS: Target date: 03/07/2022  (Remove Blue Hyperlink)  Pt will improve FOTO to target score of 66 to display perceived improvements in ability to complete ADL's.  Baseline: Eval=54 Goal status: INITIAL  2.  Pt will decrease quick DASH score by at least 8% in order to demonstrate clinically significant reduction in disability.  Baseline: EVAL Goal status: INITIAL  3.  Pt will decrease worst pain as reported on NPRS by at least 3 points in order to demonstrate  clinically significant reduction in pain.  Baseline: EVAL= 4/10 Goal status: INITIAL  4.  Pt will increase strength of  by at least 1/2 MMT grade in order to demonstrate improvement in strength and function  Baseline: EVAL= 4/5 with shoulder ABD Goal status: INITIAL PLAN:  PT FREQUENCY: 1-2x/week  PT DURATION: 12 weeks  PLANNED INTERVENTIONS: Therapeutic exercises, Therapeutic activity, Patient/Family education, Self Care, Joint mobilization, Joint manipulation, Dry Needling, Electrical stimulation, Spinal manipulation, Spinal mobilization, Cryotherapy, Moist heat, Taping, and Manual therapy  PLAN FOR NEXT SESSION: Manual Therapy for ROM and progressive therex for ROM and strengthening.                                                                                                                                                                                                                  Lewis Moccasin, PT 01/31/2022, 1:01 PM  OUTPATIENT PHYSICAL THERAPY SHOULDER TREATMENT    Patient Name: Shelby Henson MRN: [id number] DOB:[birth year], 53 y.o., female Today's Date: 01/31/2022   PT End of Session - 01/30/22 1523     Visit Number 6    Number of Visits 24    Date for PT Re-Evaluation 03/07/22    Progress Note Due on Visit 10    PT Start Time 1522    PT Stop Time 1552    PT Time Calculation (min) 30 min    Activity Tolerance Patient tolerated treatment well;Patient limited by pain    Behavior During Therapy [facility] for tasks assessed/performed               Past Medical History:  Diagnosis Date   Anemia    Asthma    well controlled   Complication of anesthesia    Depression    GERD (gastroesophageal reflux disease)    occ   Migraine    Morbid obesity ([place])     Osteoarthritis    PONV (postoperative nausea and vomiting)  with c-section x 1   Past Surgical History:  Procedure Laterality Date   ABLATION     BREAST CYST ASPIRATION Left    CESAREAN SECTION     CESAREAN SECTION     CESAREAN SECTION     COLONOSCOPY N/A 09/22/2020   Procedure: COLONOSCOPY;  Surgeon: Virgel Manifold, MD;  Location: [facility];  Service: Endoscopy;  Laterality: N/A;   HIP SURGERY  10/2018   LAPAROSCOPY  2018   LEEP  2005   benign pathology; Dr. Laurey Morale   SUPRACERVICAL ABDOMINAL HYSTERECTOMY  2010   leio/AUB; Dr. Laurey Morale   TOTAL HIP ARTHROPLASTY Right 11/12/2018   Procedure: TOTAL HIP ARTHROPLASTY ANTERIOR APPROACH;  Surgeon: Lovell Sheehan, MD;  Location: [facility];  Service: Orthopedics;  Laterality: Right;   Patient Active Problem List   Diagnosis Date Noted   Morbid obesity ([place]) 01/05/2022   Rotator cuff impingement syndrome, right 09/29/2021   Biceps tendinitis, right 09/29/2021   Meralgia paresthetica of right side 03/29/2021   Psoas tendinitis, right hip 03/29/2021   History of total right hip arthroplasty 03/29/2021   Status post total replacement of right hip 02/19/2021   Meralgia paresthetica of right side 02/19/2021   Polyp of ascending colon 02/19/2021   Polyp of sigmoid colon 02/19/2021   Special screening for malignant neoplasms, colon    Polyp of ascending colon    Polyp of sigmoid colon    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 03/23/2020   Pain in right hip 01/12/2019   Vitamin D deficiency 02/25/2018   Chronic pain 10/24/2015   Hot flashes 10/24/2015   Migraine without aura and without status migrainosus, not intractable 74/08/1446   Metabolic syndrome 18/56/3149   Dyslipidemia 10/24/2015   Allergic rhinitis, seasonal 05/17/2015   Asthma, mild intermittent 08/08/2014   Chronic insomnia 08/08/2014   DDD (degenerative disc disease), lumbar 08/08/2014   Mood disorder ([place]) 08/08/2014    Gastro-esophageal reflux disease without esophagitis 08/08/2014   H/O total hysterectomy 08/08/2014   Irritable bowel syndrome with diarrhea 08/08/2014   Osteoarthritis of right hip 08/08/2014   Adult BMI 30+ 08/08/2014   SBO (spina bifida occulta) 08/08/2014    PCP: Dr. Steele Sizer  REFERRING PROVIDER: Dr. Rosette Reveal  REFERRING DIAG:  bicep tendonitis/shoulder   THERAPY DIAG:  Muscle weakness (generalized)  Right shoulder pain, unspecified chronicity  Limited range of motion (ROM) of shoulder  Rationale for Evaluation and Treatment: Rehabilitation  ONSET DATE: 6 months ago- around March 2023  SUBJECTIVE:  SUBJECTIVE STATEMENT:  Patient reports continued right shoulder pain and stated she was able to discuss with her MD and going to have a cortizone injection on Thursday. States even painful with all exercises including below shoulder level and most movement.     PERTINENT HISTORY: Per office visit note from 11/24/2021 from Dr. Collins Scotland:  Patient presents for follow-up to right shoulder pain in the setting of rotator cuff impingement and biceps tendinitis noted on 09/29/2021 during her last visit. This had been ongoing for 6-8 months at that time.  Has been compliant with the meloxicam, noted improvement though not resolution.     PAIN:  Are you having pain? Yes: NPRS scale: 3-4/10 Pain location: R lateral shoulder/arm Pain description: Clicking and popping; some  Aggravating factors: moving arm Relieving factors: Rest, Meloxicam  PRECAUTIONS: None  WEIGHT BEARING RESTRICTIONS: No  FALLS:  Has patient fallen in last 6 months? 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   NEXT MD VISIT:   OBJECTIVE:   DIAGNOSTIC FINDINGS:  EXAM: RIGHT SHOULDER - 2+ VIEW   COMPARISON:  None Available.   FINDINGS: There is no evidence of fracture or dislocation. There is no evidence of arthropathy or other focal bone abnormality. Soft tissues are unremarkable.   IMPRESSION: Negative.     Electronically Signed   By: Dorise Bullion III M.D.   On: 08/08/2021 19:05  PATIENT SURVEYS:  [name]   and FOTO 54  COGNITION: Overall cognitive status: Within functional limits for tasks assessed     SENSATION: WFL  POSTURE: Forward head  Protracted shoulders  UPPER EXTREMITY ROM:   Active ROM Right eval Left eval  Shoulder flexion 170 WNL throughout Left UE  Shoulder extension 64   Shoulder abduction 85*   Shoulder adduction NT   Shoulder internal rotation 75   Shoulder external rotation 80*   Elbow flexion Full   Elbow extension Full   Wrist flexion WNL   Wrist extension WNL   Wrist ulnar deviation    Wrist radial deviation    Wrist pronation    Wrist supination    (Blank rows = not tested)  UPPER EXTREMITY MMT:  MMT Right eval Left eval  Shoulder flexion    Shoulder extension    Shoulder abduction    Shoulder adduction    Shoulder internal rotation    Shoulder external rotation    Middle trapezius    Lower trapezius    Elbow flexion    Elbow extension    Wrist flexion    Wrist extension    Wrist ulnar deviation    Wrist radial deviation    Wrist pronation    Wrist supination    Grip strength (lbs)    (Blank rows = not tested)  SHOULDER SPECIAL TESTS: Impingement tests: Neer impingement test: positive  and Hawkins/Kennedy impingement test: positive  SLAP lesions: Crank test: negative Instability tests: Sulcus sign: negative Rotator cuff assessment:  Drop arm test: negative and Full can test: positive  Biceps assessment: Yergason's test: negative and Speed's test: positive   JOINT MOBILITY TESTING:  WNL  PALPATION:  + Tenderness located around anteriolateral  right shoulder and down into muscle belly of biceps.   TODAY'S  TREATMENT:                                                                                                                                         DATE:   Manual Therapy: (supine)   PROM to right shoulder in supine (full ROM) some pain/catching with flex/abd with ER. Attempted to stay within painfree ROM -yet patient much more painful with any motion around 90 deg or above Grade 2-3 PA/AP/Inf GH joint mobs- 30 bouts multiple angles of flex/scap/abd and later in ER.  Application of biofreeze to right shoulder/lateral arm/bicep region   TDN Treatment: Lysle Morales)  Patient consent: After explanation of TDN Rationale, Procedures, outcomes, and potential side effects, patient verbalized consent to TDN treatment. Region/Dx: Right shoulder pain/Bicep tendonitis Muscles Treated: Right lateral deltoid region x 2 # of needles used: 2 (0.30x 30)  Post treatment pain/response: Patient reports no pain with technique;  No immediate soreness from needling Post treatment Instructions: Patient instructed to expect mild to moderate muscle soreness this evening and tomorrow. Patient instructed to continued prescribed home exercise program. Patient verbalized understanding of these instructions.   PATIENT EDUCATION: Education details: Exercise technique and HEP Person educated: Patient Education method: Explanation, Demonstration, and Verbal cues Education comprehension: verbalized understanding, returned demonstration, and needs further education  HOME EXERCISE PROGRAM: Access Code: [email or web address] URL: [email or web address] Date: 12/27/2021 Prepared by: Sande Brothers  Exercises - Standing Single Arm Shoulder  Abduction with Resistance  - 1 x daily - 3 x weekly - 3 sets - 10 reps - 2 hold - Shoulder Internal Rotation with Resistance  - 1 x daily - 3 x weekly - 3 sets - 10 reps - 2 hold - Shoulder External Rotation with Anchored Resistance  - 1 x daily - 3 x weekly - 3 sets - 10 reps - 2 hold   Access Code: [email or web address] URL: [email or web address] Date: 12/19/2021 Prepared by: Sande Brothers  Exercises - Standing Isometric Shoulder Abduction with Doorway  - 1 x daily - 3 x weekly - 3 sets - 10 reps - Standing Isometric Shoulder External Rotation with Doorway  - 1 x daily - 3 x weekly - 3 sets - 10 reps  EVAL: ROM using wand for Shoulder flex/abd/ER  ASSESSMENT:  CLINICAL IMPRESSION:  Patient continues to present with persistent Right ant/lat shoulder pain and has not responded to pain free rotator cuff strengthening. Overall- pain is no longer in bicep region but more in upper ant shoulder and lateral Upper arm. Patient instructed to halt all therex that is painful and to receive shoulder injection later this week. Will wait until next week to see her again and follow up as appropriate.  Pt will continue to benefit from PT services to address deficits in strength, UE mobility, and pain in order to return to full function at home and work with less shoulder pain.  OBJECTIVE IMPAIRMENTS: decreased ROM, decreased strength,  impaired UE functional use, and pain.   ACTIVITY LIMITATIONS: carrying, lifting, and reach over head  PARTICIPATION LIMITATIONS: occupation and yard work  PERSONAL FACTORS: 1 comorbidity: OA  are also affecting patient's functional outcome.   REHAB POTENTIAL: Good  CLINICAL DECISION MAKING: Stable/uncomplicated  EVALUATION COMPLEXITY: Low   GOALS: Goals reviewed with patient? Yes  SHORT TERM GOALS: Target date: 01/24/2022  (Remove Blue Hyperlink)  Pt will be independent with HEP in order to improve strength and decrease pain in order to improve pain-free  function at home and work. Baseline: EVAL- No formal HEP in place Goal status: INITIAL   LONG TERM GOALS: Target date: 03/07/2022  (Remove Blue Hyperlink)  Pt will improve FOTO to target score of 66 to display perceived improvements in ability to complete ADL's.  Baseline: Eval=54 Goal status: INITIAL  2.  Pt will decrease quick DASH score by at least 8% in order to demonstrate clinically significant reduction in disability.  Baseline: EVAL Goal status: INITIAL  3.  Pt will decrease worst pain as reported on NPRS by at least 3 points in order to demonstrate clinically significant reduction in pain.  Baseline: EVAL= 4/10 Goal status: INITIAL  4.  Pt will increase strength of  by at least 1/2 MMT grade in order to demonstrate improvement in strength and function  Baseline: EVAL= 4/5 with shoulder ABD Goal status: INITIAL PLAN:  PT FREQUENCY: 1-2x/week  PT DURATION: 12 weeks  PLANNED INTERVENTIONS: Therapeutic exercises, Therapeutic activity, Patient/Family education, Self Care, Joint mobilization, Joint manipulation, Dry Needling, Electrical stimulation, Spinal manipulation, Spinal mobilization, Cryotherapy, Moist heat, Taping, and Manual therapy  PLAN FOR NEXT SESSION: Manual Therapy for ROM and progressive therex for ROM and strengthening.                                                                                                                                                                                                                  Lewis Moccasin, PT 01/31/2022, 1:01 PM  OUTPATIENT PHYSICAL THERAPY SHOULDER EVALUATION   Patient Name: Shelby Henson MRN: [id number] DOB:[birth year], 52 y.o., female Today's Date: 01/31/2022   PT End of Session - 01/30/22 1523      Visit Number 6    Number of Visits 24    Date for PT Re-Evaluation 03/07/22    Progress Note Due on Visit 10    PT Start Time 1522    PT Stop Time 1552    PT Time Calculation (min) 30 min    Activity Tolerance  Patient tolerated treatment well;Patient limited by pain    Behavior During Therapy WFL for tasks assessed/performed               Past Medical History:  Diagnosis Date   Anemia    Asthma    well controlled   Complication of anesthesia    Depression    GERD (gastroesophageal reflux disease)    occ   Migraine    Morbid obesity ([place])    Osteoarthritis    PONV (postoperative nausea and vomiting)    with c-section x 1   Past Surgical History:  Procedure Laterality Date   ABLATION     BREAST CYST ASPIRATION Left    CESAREAN SECTION     CESAREAN SECTION     CESAREAN SECTION     COLONOSCOPY N/A 09/22/2020   Procedure: COLONOSCOPY;  Surgeon: Virgel Manifold, MD;  Location: [facility] ENDOSCOPY;  Service: Endoscopy;  Laterality: N/A;   HIP SURGERY  10/2018   LAPAROSCOPY  2018   LEEP  2005   benign pathology; Dr. Laurey Morale   SUPRACERVICAL ABDOMINAL HYSTERECTOMY  2010   leio/AUB; Dr. Laurey Morale   TOTAL HIP ARTHROPLASTY Right 11/12/2018   Procedure: TOTAL HIP ARTHROPLASTY ANTERIOR APPROACH;  Surgeon: Lovell Sheehan, MD;  Location: [facility] ORS;  Service: Orthopedics;  Laterality: Right;   Patient Active Problem List   Diagnosis Date Noted   Morbid obesity ([place]) 01/05/2022   Rotator cuff impingement syndrome, right 09/29/2021   Biceps tendinitis, right 09/29/2021   Meralgia paresthetica of right side 03/29/2021   Psoas tendinitis, right hip 03/29/2021   History of total right hip arthroplasty 03/29/2021   Status post total replacement of right hip 02/19/2021   Meralgia paresthetica of right side 02/19/2021   Polyp of ascending colon 02/19/2021   Polyp of sigmoid colon 02/19/2021   Special screening for malignant neoplasms, colon    Polyp of ascending colon    Polyp of  sigmoid colon    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 03/23/2020   Pain in right hip 01/12/2019   Vitamin D deficiency 02/25/2018   Chronic pain 10/24/2015   Hot flashes 10/24/2015   Migraine without aura and without status migrainosus, not intractable 29/56/2130   Metabolic syndrome 86/57/8469   Dyslipidemia 10/24/2015   Allergic rhinitis, seasonal 05/17/2015   Asthma, mild intermittent 08/08/2014   Chronic insomnia 08/08/2014   DDD (degenerative disc disease), lumbar 08/08/2014   Mood disorder ([place]) 08/08/2014   Gastro-esophageal reflux disease without esophagitis 08/08/2014   H/O total hysterectomy 08/08/2014   Irritable bowel syndrome with diarrhea 08/08/2014   Osteoarthritis of right hip 08/08/2014   Adult BMI 30+ 08/08/2014   SBO (spina bifida occulta) 08/08/2014    PCP: Dr. Steele Sizer  REFERRING PROVIDER: Dr. Rosette Reveal  REFERRING DIAG:  bicep tendonitis/shoulder   THERAPY DIAG:  Muscle weakness (generalized)  Right shoulder pain, unspecified chronicity  Limited range of motion (ROM) of shoulder  Rationale for Evaluation and Treatment: Rehabilitation  ONSET DATE: 6 months ago- around March 2023  SUBJECTIVE:  SUBJECTIVE STATEMENT: Patient reports approx 6 month history of Right arm/shoulder issues- Stating mostly "clicking" and "Popping" with reaching and moving right arm. She report the arm as more irritating than painful but states would like to be able to raise or move her arm normally without any issues.    PERTINENT HISTORY: Per office visit note from 11/24/2021 from Dr. Collins [place]:  Patient presents for follow-up to right shoulder pain in the setting of rotator cuff impingement and biceps tendinitis noted on 09/29/2021 during her last visit. This  had been ongoing for 6-8 months at that time.  Has been compliant with the meloxicam, noted improvement though not resolution.     PAIN:  Are you having pain? Yes: NPRS scale: 3-4/10 Pain location: R lateral shoulder/arm Pain description: Clicking and popping; some  Aggravating factors: moving arm Relieving factors: Rest, Meloxicam  PRECAUTIONS: None  WEIGHT BEARING RESTRICTIONS: No  FALLS:  Has patient fallen in last 6 months? 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   NEXT MD VISIT:   OBJECTIVE:   DIAGNOSTIC FINDINGS:  EXAM: RIGHT SHOULDER - 2+ VIEW   COMPARISON:  None Available.   FINDINGS: There is no evidence of fracture or dislocation. There is no evidence of arthropathy or other focal bone abnormality. Soft tissues are unremarkable.   IMPRESSION: Negative.     Electronically Signed   By: Dorise Bullion III M.D.   On: 08/08/2021 19:05  PATIENT SURVEYS:  [name]   and FOTO 54  COGNITION: Overall cognitive status: Within functional limits for tasks assessed     SENSATION: WFL  POSTURE: Forward head  Protracted shoulders  UPPER EXTREMITY ROM:   Active ROM Right eval Left eval  Shoulder flexion 170 WNL throughout Left UE  Shoulder extension 64   Shoulder abduction 85*   Shoulder adduction NT   Shoulder internal rotation 75   Shoulder external rotation 80*   Elbow flexion Full   Elbow extension Full   Wrist flexion WNL   Wrist extension WNL   Wrist ulnar deviation    Wrist radial deviation    Wrist pronation    Wrist supination    (Blank rows = not tested)  UPPER EXTREMITY MMT:  MMT Right eval Left eval  Shoulder flexion    Shoulder extension     Shoulder abduction    Shoulder adduction    Shoulder internal rotation    Shoulder external rotation    Middle trapezius    Lower trapezius    Elbow flexion    Elbow extension    Wrist flexion    Wrist extension    Wrist ulnar deviation    Wrist radial deviation    Wrist pronation    Wrist supination    Grip strength (lbs)    (Blank rows = not tested)  SHOULDER SPECIAL TESTS: Impingement tests: Neer impingement test: positive  and Hawkins/Kennedy impingement test: positive  SLAP lesions: Crank test: negative Instability tests: Sulcus sign: negative Rotator cuff assessment: Drop arm test: negative and Full can test: positive  Biceps assessment: Yergason's test: negative and Speed's test: positive   JOINT MOBILITY TESTING:  WNL  PALPATION:  + Tenderness located around anteriolateral  right shoulder and down into  muscle belly of biceps.   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 female  who was seen today for physical therapy evaluation and treatment for biceps tendonitis and right shoulder pain. PT examination reveals deficits in intermittent R shoulder pain, limited active ROM, and  Right UE muscle weakness. Pt will benefit from PT services to address deficits in strength, mobility, and pain in order to return to  full function at home and work with less shoulder pain.  OBJECTIVE IMPAIRMENTS: decreased ROM, decreased strength, impaired UE functional use, and pain.   ACTIVITY LIMITATIONS: carrying, lifting, and reach over head  PARTICIPATION LIMITATIONS: occupation and yard work  PERSONAL FACTORS: 1 comorbidity: OA  are also affecting patient's functional outcome.   REHAB POTENTIAL: Good  CLINICAL DECISION MAKING: Stable/uncomplicated  EVALUATION COMPLEXITY: Low   GOALS: Goals reviewed with patient? Yes  SHORT TERM GOALS: Target date: 01/24/2022  (Remove Blue Hyperlink)  Pt will be independent with HEP in order to improve strength and decrease pain in order to improve pain-free function at home and work. Baseline: EVAL- No formal HEP in place Goal status: INITIAL   LONG TERM GOALS: Target date: 03/07/2022  (Remove Blue Hyperlink)  Pt will improve FOTO to target score of 66 to display perceived improvements in ability to complete ADL's.  Baseline: Eval=54 Goal status: INITIAL  2.  Pt will decrease quick DASH score by at least 8% in order to demonstrate clinically significant reduction in disability.  Baseline: EVAL Goal status: INITIAL  3.  Pt will decrease worst pain as reported on NPRS by at least 3 points in order to demonstrate clinically significant reduction in pain.  Baseline: EVAL= 4/10 Goal status: INITIAL  4.  Pt will increase strength of  by at least 1/2 MMT grade in order to demonstrate improvement in strength and function  Baseline: EVAL= 4/5 with shoulder ABD Goal status: INITIAL PLAN:  PT FREQUENCY: 1-2x/week  PT DURATION: 12 weeks  PLANNED INTERVENTIONS: Therapeutic exercises, Therapeutic activity, Patient/Family education, Self Care, Joint mobilization, Joint manipulation, Dry Needling, Electrical stimulation, Spinal manipulation, Spinal mobilization, Cryotherapy, Moist heat, Taping, and Manual therapy  PLAN FOR NEXT SESSION: Manual Therapy for ROM and  progressive therex for ROM and strengthening.  Lewis Moccasin, PT 01/31/2022, 1:01 PM

## 2022-02-07 ENCOUNTER — Ambulatory Visit: Payer: 59

## 2022-02-09 ENCOUNTER — Other Ambulatory Visit: Payer: Self-pay

## 2022-02-09 ENCOUNTER — Other Ambulatory Visit: Payer: Self-pay | Admitting: Family Medicine

## 2022-02-09 ENCOUNTER — Encounter: Payer: Self-pay | Admitting: Family Medicine

## 2022-02-09 DIAGNOSIS — M545 Low back pain, unspecified: Secondary | ICD-10-CM

## 2022-02-09 DIAGNOSIS — F39 Unspecified mood [affective] disorder: Secondary | ICD-10-CM

## 2022-02-09 DIAGNOSIS — R232 Flushing: Secondary | ICD-10-CM

## 2022-02-09 MED ORDER — LIDOCAINE 5 % EX PTCH
2.0000 | MEDICATED_PATCH | CUTANEOUS | 2 refills | Status: DC
  Filled 2022-02-09: qty 60, 30d supply, fill #0

## 2022-03-05 ENCOUNTER — Ambulatory Visit: Payer: 59 | Attending: Family Medicine

## 2022-03-14 ENCOUNTER — Other Ambulatory Visit: Payer: Self-pay

## 2022-03-14 MED FILL — Diclofenac Sodium Gel 1% (1.16% Diethylamine Equiv): CUTANEOUS | 12 days supply | Qty: 100 | Fill #1 | Status: AC

## 2022-03-22 ENCOUNTER — Encounter: Payer: Self-pay | Admitting: Family Medicine

## 2022-03-23 NOTE — Telephone Encounter (Unsigned)
Copied from [place] [id number]. Topic: General - Other >> Mar 23, 2022  2:27 PM Everette C wrote: Reason for CRM: The patient has called to follow up on their previously dropped off paperwork for a handicap placard   The box indicating that the patient cannot walk without the use of assistance has been checked which is incorrect   The patient needs the box checked that says they cannot walk 200 ft without stopping to rest  The completed form also indicates that the patient is totally blind, which would be inaccurate  The patient would like to be provided a corrected form when possible   Please contact the patient further when possible

## 2022-04-11 ENCOUNTER — Other Ambulatory Visit: Payer: Self-pay

## 2022-04-11 ENCOUNTER — Other Ambulatory Visit: Payer: Self-pay | Admitting: Family Medicine

## 2022-04-11 DIAGNOSIS — G43009 Migraine without aura, not intractable, without status migrainosus: Secondary | ICD-10-CM

## 2022-04-11 MED ORDER — EMGALITY 120 MG/ML ~~LOC~~ SOAJ
1.0000 mL | SUBCUTANEOUS | 0 refills | Status: DC
  Filled 2022-04-11: qty 1, 28d supply, fill #0

## 2022-04-12 NOTE — Progress Notes (Deleted)
Name: Shelby Henson   MRN: [id number]    DOB: [birth year]   Date:04/12/2022       Progress Note  Subjective  Chief Complaint  Follow Up  HPI  GERD: she was doing well, off PPI, however she is back on NSAID's and is having symptoms again, she has an epigastric discomfort intermittently that seems to be present after she eats, she has been taking otc zantac and is working well for her at this time   Asthma mild persistent: . She had one flare earlier this year, she had mostly chest tightness used nebulizer more often and symptoms resolved. Currently no cough, wheezing or SOB. She uses pulmicort and albuterol prn    Metabolic Syndrome/Obesity : She denies polyphagia, polydipsia or polyuria. She used to be morbidly obese with BMI above 35 with co-morbidities , GERD, OA of right hip, metabolic syndrome , however lost weight on Ozempic . Weight used to be in the 170 lbs range, however in 2021 it spiked to 193 lbs, we started her on Ozempic lost down to 170 lbs but Covid and did not feel well and stopped medication, weight went up again to 180 lbs and we resumed medication in Dec 2022. We gave ger [facility] in April 23 and her weight was 177 lbs she states the 1 mg dose made her unable to eat for weeks and she decided to stop taking it. She is now following a keto diet and lost 3 lbs since October.    Chronic right hip pain: s/p replacement but still on modified duty at work and has daily pain, no longer seeing pain clinic, but sees Dr. Harlow Mares once a year. She takes Meloxicam daily, but currently taking it mostly for her thumb tendinitis and arthritis, advised to switch to Voltaren.  Pain on her hip is described as burning, dull and aching, constant, still requires her to shift positions frequently to control symptoms. Seeing neurologist now , had MRI and has mild arthritis lumbar spine, and Dr. Manuella Ghazi is ordering NCS and EMG it showed meralgia paresthetica . She is on higher dose of gabapentin also had three   injections done by Dr. Zigmund Daniel but failed the treatment and she was going to [facility] for ablation but insurance denied coverage    Insomnia: She was on Trazodone but we tried giving her Quiviviq but she was not able to fill it. Lunesta and Trazodone  made her moody, she also tried Temazepam. Discussed seroquel again and she will try prn    Mood swings :she has been on Pritiq for many years, she has a family history of bipolar She denies side effects, no anxiety symptoms, phq was negative Discussed consider Vraylar , but she states doing well at this time   Low B12: last level was low and she will get B12 injection today    Migraine headache: she states symptoms usually intense , associated with nausea, photosensitivity, throbbing like and sometimes dull. Last visit her headaches were getting to be weekly again and we switched Emgality no episodes since.    Patient Active Problem List   Diagnosis Date Noted   Morbid obesity ([place]) 01/05/2022   Rotator cuff impingement syndrome, right 09/29/2021   Biceps tendinitis, right 09/29/2021   Meralgia paresthetica of right side 03/29/2021   Psoas tendinitis, right hip 03/29/2021   History of total right hip arthroplasty 03/29/2021   Status post total replacement of right hip 02/19/2021   Meralgia paresthetica of right side 02/19/2021   Polyp  of ascending colon 02/19/2021   Polyp of sigmoid colon 02/19/2021   Special screening for malignant neoplasms, colon    Polyp of ascending colon    Polyp of sigmoid colon    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 03/23/2020   Pain in right hip 01/12/2019   Vitamin D deficiency 02/25/2018   Chronic pain 10/24/2015   Hot flashes 10/24/2015   Migraine without aura and without status migrainosus, not intractable 123XX123   Metabolic syndrome 123XX123   Dyslipidemia 10/24/2015   Allergic rhinitis, seasonal 05/17/2015   Asthma, mild intermittent 08/08/2014   Chronic insomnia  08/08/2014   DDD (degenerative disc disease), lumbar 08/08/2014   Mood disorder ([place]) 08/08/2014   Gastro-esophageal reflux disease without esophagitis 08/08/2014   H/O total hysterectomy 08/08/2014   Irritable bowel syndrome with diarrhea 08/08/2014   Osteoarthritis of right hip 08/08/2014   Adult BMI 30+ 08/08/2014   SBO (spina bifida occulta) 08/08/2014    Past Surgical History:  Procedure Laterality Date   ABLATION     BREAST CYST ASPIRATION Left    CESAREAN SECTION     CESAREAN SECTION     CESAREAN SECTION     COLONOSCOPY N/A 09/22/2020   Procedure: COLONOSCOPY;  Surgeon: Virgel Manifold, MD;  Location: [facility] ENDOSCOPY;  Service: Endoscopy;  Laterality: N/A;   HIP SURGERY  10/2018   LAPAROSCOPY  2018   LEEP  2005   benign pathology; Dr. Laurey Morale   SUPRACERVICAL ABDOMINAL HYSTERECTOMY  2010   leio/AUB; Dr. Laurey Morale   TOTAL HIP ARTHROPLASTY Right 11/12/2018   Procedure: TOTAL HIP ARTHROPLASTY ANTERIOR APPROACH;  Surgeon: Lovell Sheehan, MD;  Location: [facility] ORS;  Service: Orthopedics;  Laterality: Right;    Family History  Problem Relation Age of Onset   Depression Mother    Bipolar disorder Brother    Colon cancer Maternal Grandfather        not sure of age   Breast cancer Neg Hx     Social History   Tobacco Use   Smoking status: Never   Smokeless tobacco: Never  Substance Use Topics   Alcohol use: Yes    Alcohol/week: 0.0 standard drinks of alcohol    Comment: occasionally     Current Outpatient Medications:    albuterol (PROVENTIL) (2.5 MG/3ML) 0.083% nebulizer solution, USE ONE VIAL VIA NEBULIZER EVERY 6 HOURS AS NEEDED FOR WHEEZING OR SHORTNESS OF BREATH., Disp: 75 mL, Rfl: 2   budesonide (PULMICORT) 0.5 MG/2ML nebulizer solution, TAKE 2 MLS BY NEBULIZATION 2 TIMES DAILY., Disp: 60 mL, Rfl: 5   desvenlafaxine (PRISTIQ) 50 MG 24 hr tablet, Take 1 tablet (50 mg total) by mouth daily., Disp: 90 tablet, Rfl: 1   diclofenac Sodium (VOLTAREN) 1 % GEL, Apply  2 g topically 4 (four) times daily., Disp: 100 g, Rfl: 1   famotidine (PEPCID) 40 MG tablet, Take 1 tablet (40 mg total) by mouth daily., Disp: 90 tablet, Rfl: 1   gabapentin (NEURONTIN) 300 MG capsule, Take 3 capsules (900 mg total) by mouth at bedtime, Disp: 180 capsule, Rfl: 3   Galcanezumab-gnlm (EMGALITY) 120 MG/ML SOAJ, Inject 1 mL into the skin every 30 (thirty) days., Disp: 1 mL, Rfl: 0   lidocaine (LIDODERM) 5 %, Place 2 patches onto the skin daily. Remove & Discard patch within 12 hours or as directed by MD, Disp: 60 patch, Rfl: 2   methocarbamol (ROBAXIN) 500 MG tablet, Take 1 tablet (500 mg total) by mouth 4 (four) times  daily., Disp: 90 tablet, Rfl: 0  Allergies  Allergen Reactions   Celecoxib Hives   Prednisone     intolerance, makes her angry. Makes her angry   Topamax [Topiramate]     Mood changes   Zolpidem    Codeine Rash   Penicillins Rash    Did it involve swelling of the face/tongue/throat, SOB, or low BP? No Did it involve sudden or severe rash/hives, skin peeling, or any reaction on the inside of your mouth or nose? Yes Did you need to seek medical attention at a hospital or doctor's office? Yes When did it last happen? More than 20 years ago       If all above answers are "NO", may proceed with cephalosporin use.     I personally reviewed active problem list, medication list, allergies, family history, social history, health maintenance with the patient/caregiver today.   ROS  ***  Objective  There were no vitals filed for this visit.  There is no height or weight on file to calculate BMI.  Physical Exam ***  No results found for this or any previous visit (from the past 2160 hour(s)).   PHQ2/9:    01/05/2022    8:36 AM 11/03/2021   10:46 AM 08/25/2021    9:34 AM 08/03/2021    2:16 PM 06/02/2021    2:35 PM  Depression screen PHQ 2/9  Decreased Interest 0 0 0 0 0  Down, Depressed, Hopeless 0 0 0 0 0  PHQ - 2 Score 0 0 0 0 0  Altered sleeping '3  3 3 3 3  '$ Tired, decreased energy 0 0 0 1 0  Change in appetite 0 0 3 0 0  Feeling bad or failure about yourself  0 0 0 0 0  Trouble concentrating 0 1 0 1 1  Moving slowly or fidgety/restless 0 0 0 0 0  Suicidal thoughts 0 0 0 0 0  PHQ-9 Score '3 4 6 5 4  '$ Difficult doing work/chores    Not difficult at all Not difficult at all    phq 9 is {gen pos [id number]   Fall Risk:    01/05/2022    8:36 AM 11/03/2021   10:46 AM 08/25/2021    9:29 AM 08/03/2021    2:16 PM 06/02/2021    2:36 PM  Fall Risk   Falls in the past year? 0 0 0 0 0  Number falls in past yr: 0 0 0  0  Injury with Fall? 0 0 0 0 0  Risk for fall due to : No Fall Risks No Fall Risks No Fall Risks  No Fall Risks  Follow up Falls prevention discussed Falls prevention discussed Falls prevention discussed  Falls evaluation completed      Functional Status Survey:      Assessment & Plan  *** There are no diagnoses linked to this encounter.

## 2022-04-13 ENCOUNTER — Ambulatory Visit: Payer: 59 | Admitting: Family Medicine

## 2022-04-17 ENCOUNTER — Encounter: Payer: Self-pay | Admitting: Family Medicine

## 2022-04-17 NOTE — Telephone Encounter (Signed)
Please advise 

## 2022-04-21 IMAGING — MG DIGITAL SCREENING BILAT W/ TOMO W/ CAD
8 series · 8 of 24 positions shown · non-contrast
Comparison: Previous exam(s).

CLINICAL DATA: Screening.

EXAM:
DIGITAL SCREENING BILATERAL MAMMOGRAM WITH TOMO AND CAD

[R CC synth-2D]
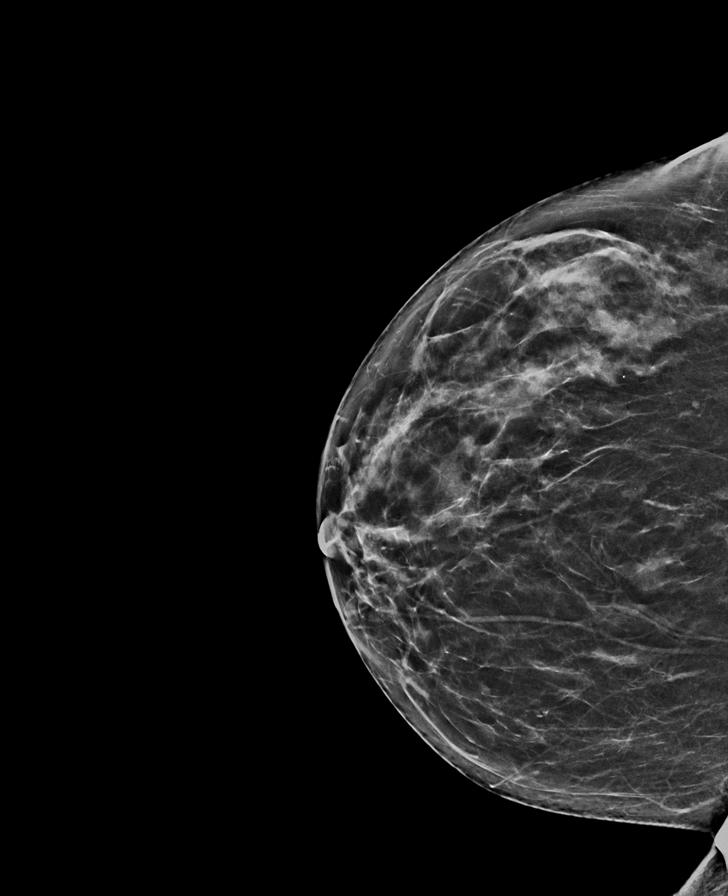

[L CC synth-2D]
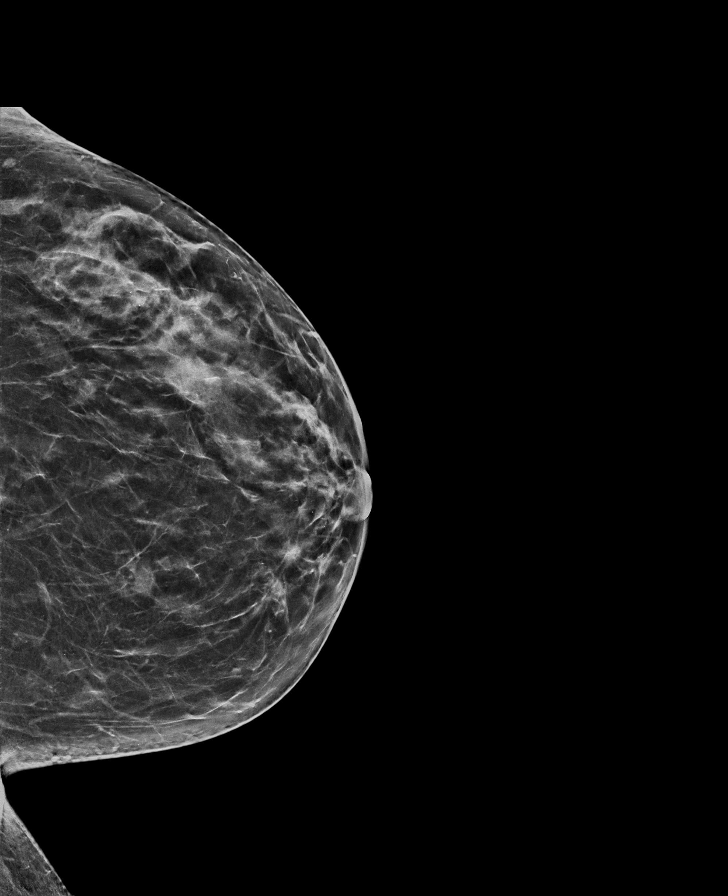

[L MLO synth-2D]
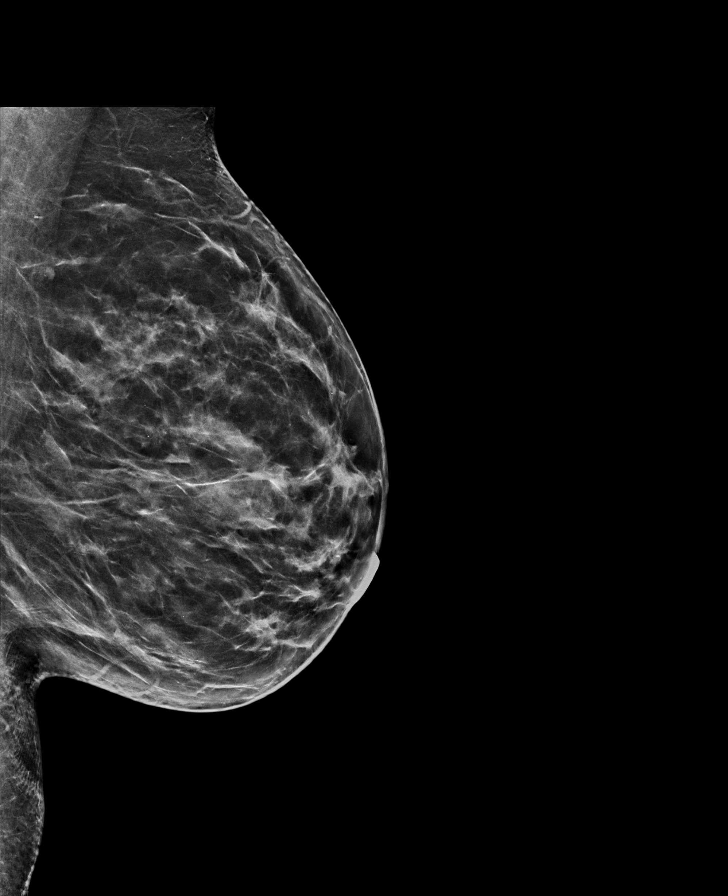

[R MLO synth-2D]
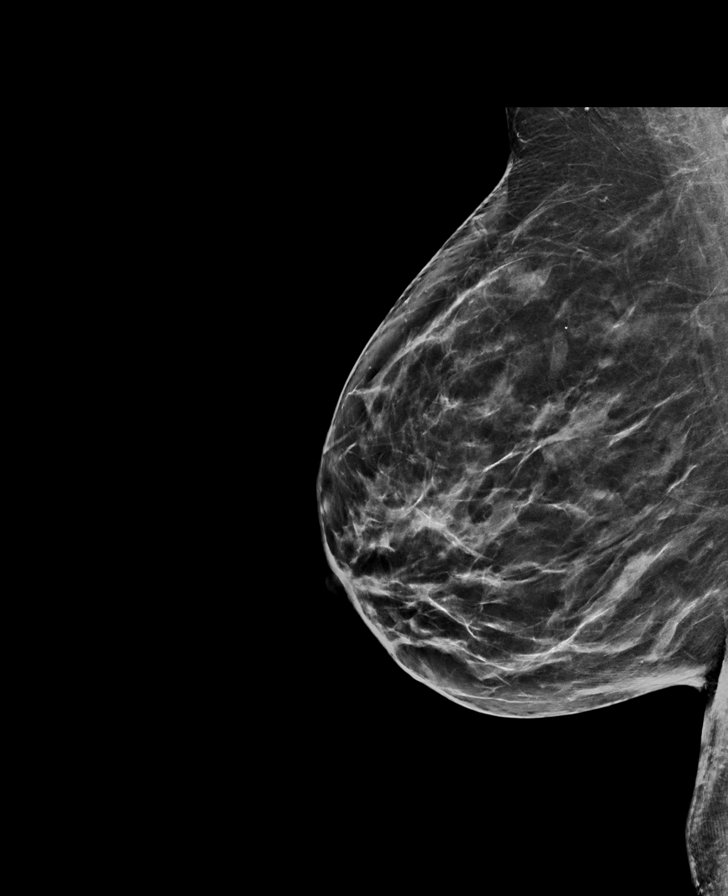

[R MLO tomo · tomo slice 36/71.0]
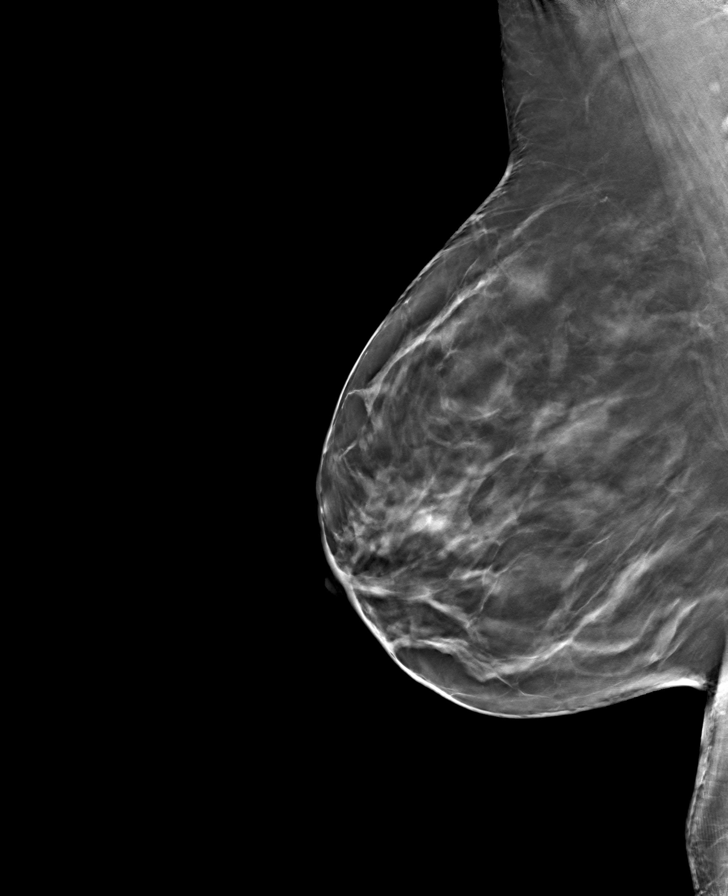

[R CC tomo · tomo slice 32/63.0]
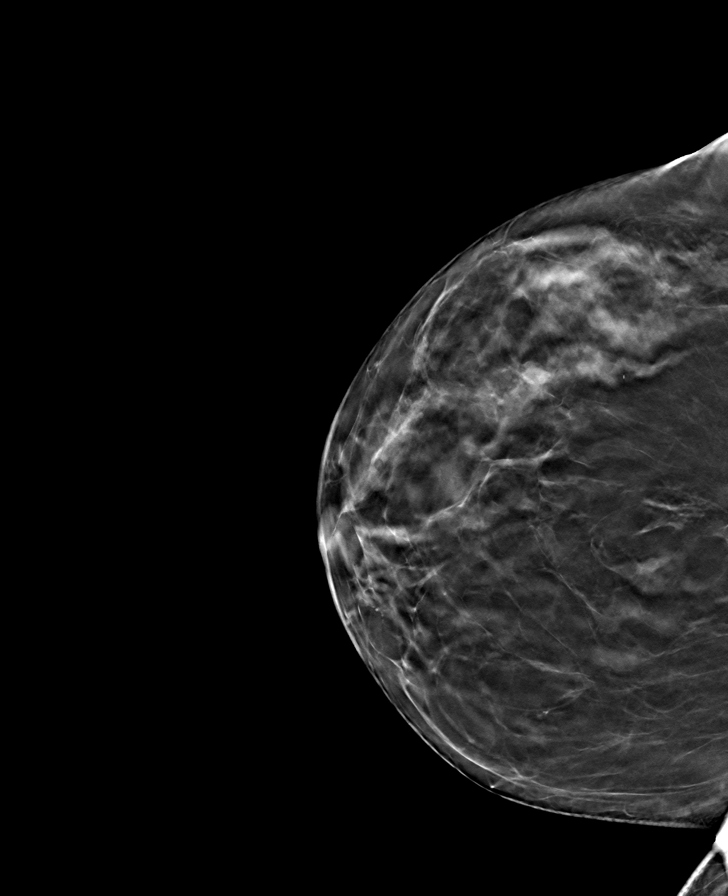

[L CC tomo · tomo slice 33/64.0]
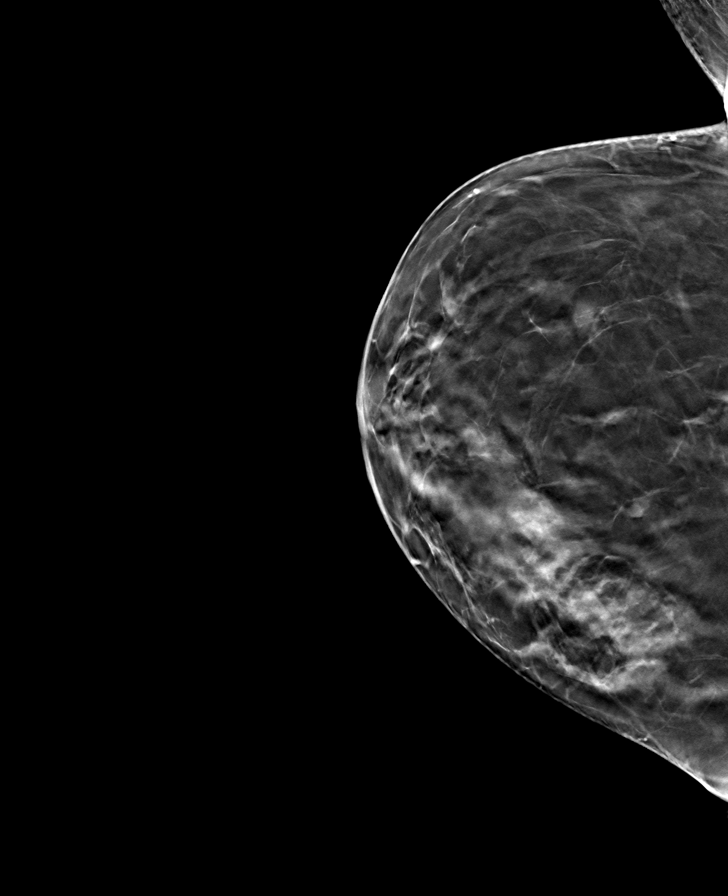

[L MLO tomo · tomo slice 33/66.0]
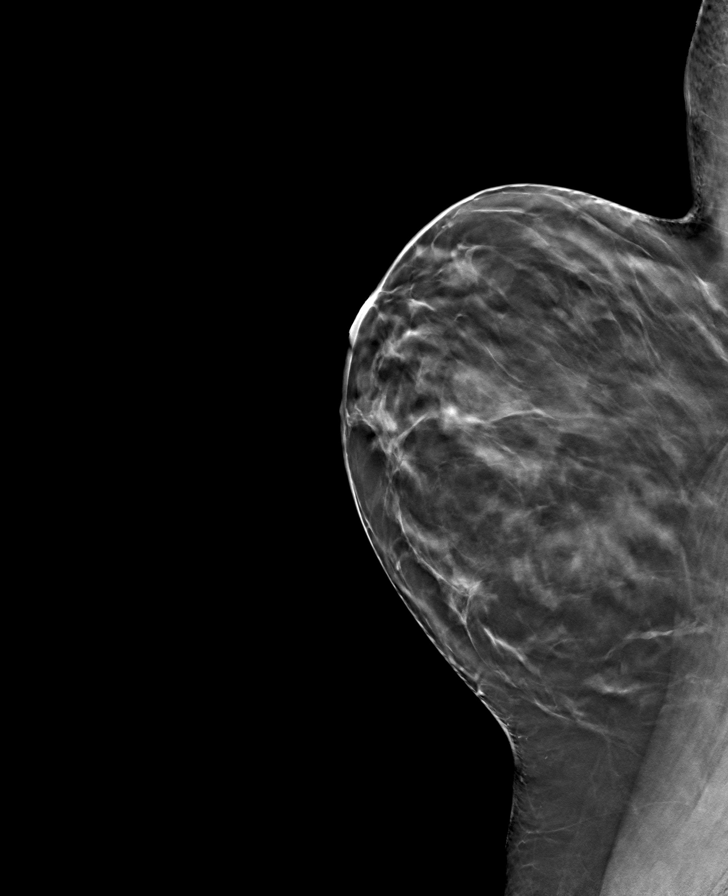

[8 of 24 positions shown; findings below may reference images not displayed]

ACR Breast Density Category c: The breast tissue is heterogeneously
dense, which may obscure small masses.
FINDINGS: There are no findings suspicious for malignancy. Images were
processed with CAD.
IMPRESSION: No mammographic evidence of malignancy. A result letter of this
screening mammogram will be mailed directly to the patient.

RECOMMENDATION:
Screening mammogram in one year. (Code:[id number])

BI-RADS CATEGORY  1: Negative.

## 2022-05-10 ENCOUNTER — Other Ambulatory Visit: Payer: Self-pay

## 2022-05-10 NOTE — Progress Notes (Signed)
Name: Shelby Henson   MRN: [id number]    DOB: 11/19/1970   Date:05/11/2022       Progress Note  Subjective  Chief Complaint  Follow Up  HPI  GERD: she was doing well, off PPI,  she takes famotidine daily and symptoms are controlled    Asthma mild persistent: . She had one flare earlier this year, she had mostly chest tightness used nebulizer more often and symptoms resolved. Currently no cough, wheezing or SOB. She uses pulmicort and albuterol prn Unchanged    Metabolic Syndrome/Obesity : She denies polyphagia, polydipsia or polyuria. She used to be morbidly obese with BMI above 35 with co-morbidities , GERD, OA of right hip, metabolic syndrome , however lost weight on Ozempic . Weight used to be in the 170 lbs range, however in 2021 it spiked to 193 lbs, we started her on Ozempic lost down to 170 lbs but Covid and did not feel well and stopped medication, weight went up again to 180 lbs and we resumed medication in Dec 2022. [facility] and her weight was 177 lbs she states the 1 mg dose made her unable to eat for weeks and she decided to stop taking it. She is now following a low carb diet and weight is trending up , she is frustrated.    Chronic right hip pain: s/p replacement but still on modified duty at work and has daily pain, no longer seeing pain clinic, but sees Shelby Henson once a year. She takes Meloxicam daily, but currently taking it mostly for her thumb tendinitis and arthritis, advised to switch to Voltaren.  Pain on her hip is described as burning, dull and aching, constant, still requires her to shift positions frequently to control symptoms. Seeing neurologist now , had MRI and has mild arthritis lumbar spine, and Dr. Sherryll BurgerShah is ordering NCS and EMG it showed meralgia paresthetica . She is on higher dose of gabapentin also had three  injections done by Dr. Ashley RoyaltyMatthews but failed the treatment and [facility] but did not a find a provider that would be  able to do the procedure in [place]. Symptoms are stable    Insomnia:  Lunesta, Seroquel, Temazepam and  Trazodone made her moody. Melatonin did not work. We will try prazosin to see if she can tolerate it    Mood swings :she has been on Pritiq for many years, she has a family history of bipolar She denies side effects, no anxiety symptoms, phq was negative but she is not sleeping well and has fatigue and lack of concentration due to inability to sleep   Low B12: last level was low and she will get B12 injection today    Migraine headache: she states symptoms usually intense , associated with nausea, photosensitivity, throbbing like and sometimes dull. Emgality is working for her, no episodes of migraine in months   Patient Active Problem List   Diagnosis Date Noted   Morbid obesity 01/05/2022   Rotator cuff impingement syndrome, right 09/29/2021   Biceps tendinitis, right 09/29/2021   Meralgia paresthetica of right side 03/29/2021   Psoas tendinitis, right hip 03/29/2021   History of total right hip arthroplasty 03/29/2021   Status post total replacement of right hip 02/19/2021   Meralgia paresthetica of right side 02/19/2021   Polyp of ascending colon 02/19/2021   Polyp of sigmoid colon 02/19/2021   Special screening for malignant neoplasms, colon    Polyp  of ascending colon    Polyp of sigmoid colon    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 03/23/2020   Pain in right hip 01/12/2019   Vitamin D deficiency 02/25/2018   Chronic pain 10/24/2015   Hot flashes 10/24/2015   Migraine without aura and without status migrainosus, not intractable 10/24/2015   Metabolic syndrome 10/24/2015   Dyslipidemia 10/24/2015   Allergic rhinitis, seasonal 05/17/2015   Asthma, mild intermittent 08/08/2014   Chronic insomnia 08/08/2014   DDD (degenerative disc disease), lumbar 08/08/2014   Mood disorder 08/08/2014   Gastro-esophageal reflux disease without esophagitis 08/08/2014    H/O total hysterectomy 08/08/2014   Irritable bowel syndrome with diarrhea 08/08/2014   Osteoarthritis of right hip 08/08/2014   Adult BMI 30+ 08/08/2014   SBO (spina bifida occulta) 08/08/2014    Past Surgical History:  Procedure Laterality Date   ABLATION     BREAST CYST ASPIRATION Left    CESAREAN SECTION     CESAREAN SECTION     CESAREAN SECTION     COLONOSCOPY N/A 09/22/2020   Procedure: COLONOSCOPY;  Surgeon: Pasty Spillers, MD;  Location: [facility] ENDOSCOPY;  Service: Endoscopy;  Laterality: N/A;   HIP SURGERY  10/2018   LAPAROSCOPY  2018   LEEP  2005   benign pathology; Dr. Luella Cook   SUPRACERVICAL ABDOMINAL HYSTERECTOMY  2010   leio/AUB; Dr. Luella Cook   TOTAL HIP ARTHROPLASTY Right 11/12/2018   Procedure: TOTAL HIP ARTHROPLASTY ANTERIOR APPROACH;  Surgeon: Lyndle Herrlich, MD;  Location: [facility] ORS;  Service: Orthopedics;  Laterality: Right;    Family History  Problem Relation Age of Onset   Depression Mother    Bipolar disorder Brother    Colon cancer Maternal Grandfather        not sure of age   Breast cancer Neg Hx     Social History   Tobacco Use   Smoking status: Never   Smokeless tobacco: Never  Substance Use Topics   Alcohol use: Yes    Alcohol/week: 0.0 standard drinks of alcohol    Comment: occasionally     Current Outpatient Medications:    albuterol (PROVENTIL) (2.5 MG/3ML) 0.083% nebulizer solution, USE ONE VIAL VIA NEBULIZER EVERY 6 HOURS AS NEEDED FOR WHEEZING OR SHORTNESS OF BREATH., Disp: 75 mL, Rfl: 2   desvenlafaxine (PRISTIQ) 50 MG 24 hr tablet, Take 1 tablet (50 mg total) by mouth daily., Disp: 90 tablet, Rfl: 1   diclofenac Sodium (VOLTAREN) 1 % GEL, Apply 2 g topically 4 (four) times daily., Disp: 100 g, Rfl: 1   famotidine (PEPCID) 40 MG tablet, Take 1 tablet (40 mg total) by mouth daily., Disp: 90 tablet, Rfl: 1   gabapentin (NEURONTIN) 300 MG capsule, Take 3 capsules (900 mg total) by mouth at bedtime, Disp: 180 capsule, Rfl: 3    Galcanezumab-gnlm (EMGALITY) 120 MG/ML SOAJ, Inject 1 mL into the skin every 30 (thirty) days., Disp: 1 mL, Rfl: 0   lidocaine (LIDODERM) 5 %, Place 2 patches onto the skin daily. Remove & Discard patch within 12 hours or as directed by MD, Disp: 60 patch, Rfl: 2   budesonide (PULMICORT) 0.5 MG/2ML nebulizer solution, TAKE 2 MLS BY NEBULIZATION 2 TIMES DAILY., Disp: 60 mL, Rfl: 5  Allergies  Allergen Reactions   Celecoxib Hives   Prednisone     intolerance, makes her angry. Makes her angry   Topamax [Topiramate]     Mood changes   Zolpidem    Codeine Rash  Penicillins Rash    Did it involve swelling of the face/tongue/throat, SOB, or low BP? No Did it involve sudden or severe rash/hives, skin peeling, or any reaction on the inside of your mouth or nose? Yes Did you need to seek medical attention at a hospital or doctor's office? Yes When did it last happen? More than 20 years ago       If all above answers are "NO", may proceed with cephalosporin use.     I personally reviewed active problem list, medication list, allergies, family history, social history, health maintenance with the patient/caregiver today.   ROS  Constitutional: Negative for fever , positive for  weight change.  Respiratory: Negative for cough and shortness of breath.   Cardiovascular: Negative for chest pain or palpitations.  Gastrointestinal: Negative for abdominal pain, no bowel changes.  Musculoskeletal: positive  for gait problem but no  joint swelling.  Skin: Negative for rash.  Neurological: Negative for dizziness or headache.  No other specific complaints in a complete review of systems (except as listed in HPI above).   Objective  Vitals:   05/11/22 1104  BP: 122/76  Pulse: 96  Resp: 14  Temp: 97.8 F (36.6 C)  TempSrc: Oral  SpO2: 98%  Weight: 177 lb 9.6 oz (80.6 kg)  Height: 4\' 11"  (1.499 m)    Body mass index is 35.87 kg/m.  Physical Exam  Constitutional: Patient appears  well-developed and well-nourished. Obese  No distress.  HEENT: head atraumatic, normocephalic, pupils equal and reactive to light, neck supple Cardiovascular: Normal rate, regular rhythm and normal heart sounds.  No murmur heard. No BLE edema. Pulmonary/Chest: Effort normal and breath sounds normal. No respiratory distress. Abdominal: Soft.  There is no tenderness. Psychiatric: Patient has a normal mood and affect. behavior is normal. Judgment and thought content normal.   PHQ2/9:    05/11/2022   11:16 AM 01/05/2022    8:36 AM 11/03/2021   10:46 AM 08/25/2021    9:34 AM 08/03/2021    2:16 PM  Depression screen PHQ 2/9  Decreased Interest 0 0 0 0 0  Down, Depressed, Hopeless 0 0 0 0 0  PHQ - 2 Score 0 0 0 0 0  Altered sleeping 3 3 3 3 3   Tired, decreased energy 3 0 0 0 1  Change in appetite 1 0 0 3 0  Feeling bad or failure about yourself  0 0 0 0 0  Trouble concentrating 2 0 1 0 1  Moving slowly or fidgety/restless 0 0 0 0 0  Suicidal thoughts 0 0 0 0 0  PHQ-9 Score 9 3 4 6 5   Difficult doing work/chores Somewhat difficult    Not difficult at all    phq 9 is positive   Fall Risk:    05/11/2022   11:04 AM 01/05/2022    8:36 AM 11/03/2021   10:46 AM 08/25/2021    9:29 AM 08/03/2021    2:16 PM  Fall Risk   Falls in the past year? 0 0 0 0 0  Number falls in past yr:  0 0 0   Injury with Fall?  0 0 0 0  Risk for fall due to : No Fall Risks No Fall Risks No Fall Risks No Fall Risks   Follow up Falls prevention discussed Falls prevention discussed Falls prevention discussed Falls prevention discussed       Functional Status Survey: Is the patient deaf or have difficulty hearing?: No Does the patient have  difficulty seeing, even when wearing glasses/contacts?: No Does the patient have difficulty concentrating, remembering, or making decisions?: No Does the patient have difficulty walking or climbing stairs?: Yes Does the patient have difficulty dressing or bathing?: No Does the  patient have difficulty doing errands alone such as visiting a doctor's office or shopping?: No    Assessment & Plan  1. Chronic insomnia  - prazosin (MINIPRESS) 1 MG capsule; Take 1 capsule (1 mg total) by mouth at bedtime.  Dispense: 30 capsule; Refill: 0  2. Migraine without aura and without status migrainosus, not intractable  - Galcanezumab-gnlm (EMGALITY) 120 MG/ML SOAJ; Inject 1 mL into the skin every 30 (thirty) days.  Dispense: 1 mL; Refill: 5  3. Morbid obesity  Discussed with the patient the risk posed by an increased BMI. Discussed importance of portion control, calorie counting and at least 150 minutes of physical activity weekly. Avoid sweet beverages and drink more water. Eat at least 6 servings of fruit and vegetables daily    4. Mood disorder  Stable   5. Hot flashes  Intermittently and stable   6. Mild intermittent asthma in adult without complication   7. B12 deficiency  - B12 and Folate Panel  8. Vitamin D deficiency  - VITAMIN D 25 Hydroxy (Vit-D Deficiency, Fractures)  9. Dyslipidemia  - Lipid panel - COMPLETE METABOLIC PANEL WITH GFR  10. Metabolic syndrome  - COMPLETE METABOLIC PANEL WITH GFR - Hemoglobin A1c  11. History of total right hip arthroplasty   12. Routine screening for STI (sexually transmitted infection)  - RPR - HIV Antibody (routine testing w rflx)  13. History of iron deficiency anemia  - CBC with Differential/Platelet - Iron, TIBC and Ferritin Panel

## 2022-05-11 ENCOUNTER — Ambulatory Visit (INDEPENDENT_AMBULATORY_CARE_PROVIDER_SITE_OTHER): Payer: 59 | Admitting: Family Medicine

## 2022-05-11 ENCOUNTER — Other Ambulatory Visit: Payer: Self-pay

## 2022-05-11 ENCOUNTER — Encounter: Payer: Self-pay | Admitting: Family Medicine

## 2022-05-11 VITALS — BP 128/78 | HR 96 | Ht 59.0 in | Wt 178.0 lb

## 2022-05-11 VITALS — BP 122/76 | HR 96 | Temp 97.8°F | Resp 14 | Ht 59.0 in | Wt 177.6 lb

## 2022-05-11 DIAGNOSIS — R232 Flushing: Secondary | ICD-10-CM

## 2022-05-11 DIAGNOSIS — Z113 Encounter for screening for infections with a predominantly sexual mode of transmission: Secondary | ICD-10-CM

## 2022-05-11 DIAGNOSIS — G43009 Migraine without aura, not intractable, without status migrainosus: Secondary | ICD-10-CM

## 2022-05-11 DIAGNOSIS — E8881 Metabolic syndrome: Secondary | ICD-10-CM

## 2022-05-11 DIAGNOSIS — E785 Hyperlipidemia, unspecified: Secondary | ICD-10-CM | POA: Diagnosis not present

## 2022-05-11 DIAGNOSIS — E538 Deficiency of other specified B group vitamins: Secondary | ICD-10-CM | POA: Diagnosis not present

## 2022-05-11 DIAGNOSIS — Z862 Personal history of diseases of the blood and blood-forming organs and certain disorders involving the immune mechanism: Secondary | ICD-10-CM | POA: Diagnosis not present

## 2022-05-11 DIAGNOSIS — E559 Vitamin D deficiency, unspecified: Secondary | ICD-10-CM | POA: Diagnosis not present

## 2022-05-11 DIAGNOSIS — J4520 Mild intermittent asthma, uncomplicated: Secondary | ICD-10-CM | POA: Diagnosis not present

## 2022-05-11 DIAGNOSIS — Z96641 Presence of right artificial hip joint: Secondary | ICD-10-CM

## 2022-05-11 DIAGNOSIS — M25511 Pain in right shoulder: Secondary | ICD-10-CM

## 2022-05-11 DIAGNOSIS — F5104 Psychophysiologic insomnia: Secondary | ICD-10-CM | POA: Diagnosis not present

## 2022-05-11 DIAGNOSIS — G8929 Other chronic pain: Secondary | ICD-10-CM | POA: Diagnosis not present

## 2022-05-11 DIAGNOSIS — F39 Unspecified mood [affective] disorder: Secondary | ICD-10-CM | POA: Diagnosis not present

## 2022-05-11 MED ORDER — PRAZOSIN HCL 1 MG PO CAPS
1.0000 mg | ORAL_CAPSULE | Freq: Every day | ORAL | 0 refills | Status: DC
  Filled 2022-05-11: qty 30, 30d supply, fill #0

## 2022-05-11 MED ORDER — CYANOCOBALAMIN 1000 MCG/ML IJ SOLN
1000.0000 ug | Freq: Once | INTRAMUSCULAR | Status: AC
Start: 2022-05-11 — End: 2022-05-11
  Administered 2022-05-11: 1000 ug via INTRAMUSCULAR

## 2022-05-11 MED ORDER — EMGALITY 120 MG/ML ~~LOC~~ SOAJ
1.0000 mL | SUBCUTANEOUS | 5 refills | Status: DC
  Filled 2022-05-11: qty 1, 28d supply, fill #0
  Filled 2022-06-26: qty 1, 28d supply, fill #1

## 2022-05-11 NOTE — Patient Instructions (Signed)
-   Review information attached - Follow-up as-needed

## 2022-05-11 NOTE — Assessment & Plan Note (Signed)
Noted in the setting of concomitant right rotator cuff impingement and biceps tendinitis.  The latter issues have greatly improved and she has residual pain throughout the right shoulder blade, has completed physical therapy and receives needling with short-lived benefit.  Exam consistent with multiple trigger points in the scapular and parascapular region, elected to proceed with trigger point injections today, and tolerated these well.  - Multiple trigger point injections today, tolerated well - Restart home exercises after 2 days - Patient to contact if symptoms recur, low threshold for advanced imaging given chronicity

## 2022-05-11 NOTE — Progress Notes (Signed)
     Primary Care / Sports Medicine Office Visit  Patient Information:  Patient ID: ANVI DAIGLER, female DOB: [birth year] Age: 52 y.o. MRN: [id number]   ASMI KURTH is a pleasant 52 y.o. female presenting with the following:  Chief Complaint  Patient presents with   Shoulder Pain    Right muscle     Vitals:   05/11/22 1330  BP: 128/78  Pulse: 96  SpO2: 99%   Vitals:   05/11/22 1330  Weight: 178 lb (80.7 kg)  Height: 4\' 11"  (1.499 m)   Body mass index is 35.95 kg/m.  No results found.   Independent interpretation of notes and tests performed by another provider:   None  Procedures performed:   Trigger point injections were performed at the site of maximal tenderness using 1% plain Lidocaine and Kenalog. This was well tolerated.  Pertinent History, Exam, Impression, and Recommendations:   Shona was seen today for shoulder pain.  Chronic right shoulder pain Assessment & Plan: Noted in the setting of concomitant right rotator cuff impingement and biceps tendinitis.  The latter issues have greatly improved and she has residual pain throughout the right shoulder blade, has completed physical therapy and receives needling with short-lived benefit.  Exam consistent with multiple trigger points in the scapular and parascapular region, elected to proceed with trigger point injections today, and tolerated these well.  - Multiple trigger point injections today, tolerated well - Restart home exercises after 2 days - Patient to contact if symptoms recur, low threshold for advanced imaging given chronicity       Orders & Medications No orders of the defined types were placed in this encounter.  No orders of the defined types were placed in this encounter.    Return if symptoms worsen or fail to improve.     Jerrol Banana, MD, [facility]   Primary Care Sports Medicine Primary Care and Sports Medicine at [facility]

## 2022-05-12 LAB — CBC WITH DIFFERENTIAL/PLATELET
Absolute Monocytes: 461 cells/uL (ref 200–950)
Basophils Absolute: 58 cells/uL (ref 0–200)
Basophils Relative: 0.8 %
Eosinophils Absolute: 1152 cells/uL — ABNORMAL HIGH (ref 15–500)
Eosinophils Relative: 16 %
HCT: 36 % (ref 35.0–45.0)
Hemoglobin: 11.5 g/dL — ABNORMAL LOW (ref 11.7–15.5)
Lymphs Abs: 1966 cells/uL (ref 850–3900)
MCH: 25.3 pg — ABNORMAL LOW (ref 27.0–33.0)
MCHC: 31.9 g/dL — ABNORMAL LOW (ref 32.0–36.0)
MCV: 79.3 fL — ABNORMAL LOW (ref 80.0–100.0)
MPV: 12 fL (ref 7.5–12.5)
Monocytes Relative: 6.4 %
Neutro Abs: 3564 cells/uL (ref 1500–7800)
Neutrophils Relative %: 49.5 %
Platelets: 326 10*3/uL (ref 140–400)
RBC: 4.54 10*6/uL (ref 3.80–5.10)
RDW: 14.1 % (ref 11.0–15.0)
Total Lymphocyte: 27.3 %
WBC: 7.2 10*3/uL (ref 3.8–10.8)

## 2022-05-12 LAB — COMPLETE METABOLIC PANEL WITHOUT GFR
AG Ratio: 1.3 (calc) (ref 1.0–2.5)
ALT: 25 U/L (ref 6–29)
AST: 26 U/L (ref 10–35)
Albumin: 4.5 g/dL (ref 3.6–5.1)
Alkaline phosphatase (APISO): 132 U/L (ref 37–153)
BUN: 15 mg/dL (ref 7–25)
CO2: 26 mmol/L (ref 20–32)
Calcium: 9.9 mg/dL (ref 8.6–10.4)
Chloride: 104 mmol/L (ref 98–110)
Creat: 0.7 mg/dL (ref 0.50–1.03)
Globulin: 3.5 g/dL (ref 1.9–3.7)
Glucose, Bld: 91 mg/dL (ref 65–99)
Potassium: 4.2 mmol/L (ref 3.5–5.3)
Sodium: 141 mmol/L (ref 135–146)
Total Bilirubin: 0.3 mg/dL (ref 0.2–1.2)
Total Protein: 8 g/dL (ref 6.1–8.1)
eGFR: 103 mL/min/{1.73_m2}

## 2022-05-12 LAB — VITAMIN D 25 HYDROXY (VIT D DEFICIENCY, FRACTURES): Vit D, 25-Hydroxy: 23 ng/mL — ABNORMAL LOW (ref 30–100)

## 2022-05-12 LAB — IRON,TIBC AND FERRITIN PANEL
%SAT: 8 % (calc) — ABNORMAL LOW (ref 16–45)
Ferritin: 10 ng/mL — ABNORMAL LOW (ref 16–232)
Iron: 40 ug/dL — ABNORMAL LOW (ref 45–160)
TIBC: 489 mcg/dL (calc) — ABNORMAL HIGH (ref 250–450)

## 2022-05-12 LAB — LIPID PANEL
Cholesterol: 228 mg/dL — ABNORMAL HIGH (ref <200)
HDL: 57 mg/dL (ref >=50)
LDL Cholesterol (Calc): 143 mg/dL (calc) — ABNORMAL HIGH
Non-HDL Cholesterol (Calc): 171 mg/dL (calc) — ABNORMAL HIGH (ref <130)
Total CHOL/HDL Ratio: 4 (calc) (ref <5.0)
Triglycerides: 146 mg/dL (ref <150)

## 2022-05-12 LAB — B12 AND FOLATE PANEL
Folate: 7.1 ng/mL
Vitamin B-12: 430 pg/mL (ref 200–1100)

## 2022-05-12 LAB — HEMOGLOBIN A1C
Hgb A1c MFr Bld: 5.7 %{Hb} — ABNORMAL HIGH
Mean Plasma Glucose: 117 mg/dL
eAG (mmol/L): 6.5 mmol/L

## 2022-05-12 LAB — HIV ANTIBODY (ROUTINE TESTING W REFLEX): HIV 1&2 Ab, 4th Generation: NONREACTIVE

## 2022-05-12 LAB — RPR: RPR Ser Ql: NONREACTIVE

## 2022-05-14 ENCOUNTER — Encounter: Payer: Self-pay | Admitting: Family Medicine

## 2022-05-14 NOTE — Telephone Encounter (Signed)
Please advise 

## 2022-05-15 ENCOUNTER — Other Ambulatory Visit: Payer: Self-pay | Admitting: Family Medicine

## 2022-05-15 DIAGNOSIS — G8929 Other chronic pain: Secondary | ICD-10-CM

## 2022-05-15 DIAGNOSIS — M7521 Bicipital tendinitis, right shoulder: Secondary | ICD-10-CM

## 2022-05-15 DIAGNOSIS — M7541 Impingement syndrome of right shoulder: Secondary | ICD-10-CM

## 2022-05-16 ENCOUNTER — Other Ambulatory Visit: Payer: Self-pay

## 2022-05-16 ENCOUNTER — Other Ambulatory Visit: Payer: Self-pay | Admitting: Family Medicine

## 2022-05-16 ENCOUNTER — Other Ambulatory Visit: Payer: Self-pay | Admitting: Nurse Practitioner

## 2022-05-16 DIAGNOSIS — J4520 Mild intermittent asthma, uncomplicated: Secondary | ICD-10-CM

## 2022-05-16 DIAGNOSIS — J4530 Mild persistent asthma, uncomplicated: Secondary | ICD-10-CM

## 2022-05-16 MED ORDER — BUDESONIDE 0.5 MG/2ML IN SUSP
2.0000 mL | Freq: Two times a day (BID) | RESPIRATORY_TRACT | 5 refills | Status: DC
Start: 2022-05-16 — End: 2022-08-10
  Filled 2022-05-24: qty 120, 30d supply, fill #0

## 2022-05-17 ENCOUNTER — Other Ambulatory Visit: Payer: Self-pay

## 2022-05-17 NOTE — Telephone Encounter (Signed)
Unable to refill per protocol, Rx request is too soon.  Requested Prescriptions  Pending Prescriptions Disp Refills   albuterol (PROVENTIL) (2.5 MG/3ML) 0.083% nebulizer solution 75 mL 2    Sig: USE ONE VIAL VIA NEBULIZER EVERY 6 HOURS AS NEEDED FOR WHEEZING OR SHORTNESS OF BREATH.     Pulmonology:  Beta Agonists 2 Passed - 05/16/2022  7:47 AM      Passed - Last BP in normal range    BP Readings from Last 1 Encounters:  05/11/22 128/78         Passed - Last Heart Rate in normal range    Pulse Readings from Last 1 Encounters:  05/11/22 96         Passed - Valid encounter within last 12 months    Recent Outpatient Visits           6 days ago Chronic right shoulder pain    [facility], Ocie Bob, MD   6 days ago Chronic insomnia   [facility] Alba Cory, MD   4 months ago Biceps tendinitis, right   [facility], Ocie Bob, MD   4 months ago Mood disorder [facility])   [facility] Alba Cory, MD   5 months ago Rotator cuff impingement syndrome, right   [facility] at [facility], Ocie Bob, MD       Future Appointments             In 2 months Alba Cory, MD [facility]

## 2022-05-21 ENCOUNTER — Ambulatory Visit
Admission: RE | Admit: 2022-05-21 | Discharge: 2022-05-21 | Disposition: A | Discharge status: Home / Self Care | Payer: 59 | Source: Ambulatory Visit | Attending: Family Medicine | Admitting: Family Medicine

## 2022-05-21 DIAGNOSIS — M7521 Bicipital tendinitis, right shoulder: Secondary | ICD-10-CM | POA: Insufficient documentation

## 2022-05-21 DIAGNOSIS — G8929 Other chronic pain: Secondary | ICD-10-CM

## 2022-05-21 DIAGNOSIS — M25511 Pain in right shoulder: Secondary | ICD-10-CM | POA: Diagnosis not present

## 2022-05-21 DIAGNOSIS — M7541 Impingement syndrome of right shoulder: Secondary | ICD-10-CM

## 2022-05-24 ENCOUNTER — Other Ambulatory Visit: Payer: Self-pay

## 2022-05-24 ENCOUNTER — Encounter: Payer: Self-pay | Admitting: Family Medicine

## 2022-05-24 NOTE — Telephone Encounter (Signed)
Please review.  KP

## 2022-05-28 ENCOUNTER — Encounter: Payer: Self-pay | Admitting: Family Medicine

## 2022-05-29 ENCOUNTER — Encounter: Payer: Self-pay | Admitting: Family Medicine

## 2022-05-29 LAB — POC HEMOCCULT BLD/STL (HOME/3-CARD/SCREEN)
Card #2 Fecal Occult Blod, POC: NEGATIVE
Card #3 Fecal Occult Blood, POC: NEGATIVE
Fecal Occult Blood, POC: NEGATIVE

## 2022-05-29 NOTE — Addendum Note (Signed)
Addended by: Dollene Primrose on: 05/29/2022 03:28 PM   Modules accepted: Orders

## 2022-05-31 ENCOUNTER — Encounter: Payer: Self-pay | Admitting: Family Medicine

## 2022-06-04 ENCOUNTER — Other Ambulatory Visit: Payer: Self-pay

## 2022-06-04 ENCOUNTER — Other Ambulatory Visit: Payer: Self-pay | Admitting: Family Medicine

## 2022-06-04 MED ORDER — IRON (FERROUS SULFATE) 325 (65 FE) MG PO TABS
325.0000 mg | ORAL_TABLET | Freq: Every day | ORAL | 1 refills | Status: DC
  Filled 2022-06-04: qty 100, fill #0

## 2022-06-05 ENCOUNTER — Other Ambulatory Visit: Payer: Self-pay

## 2022-06-26 ENCOUNTER — Other Ambulatory Visit: Payer: Self-pay

## 2022-06-26 ENCOUNTER — Encounter: Payer: Self-pay | Admitting: Family Medicine

## 2022-06-26 ENCOUNTER — Other Ambulatory Visit: Payer: Self-pay | Admitting: Family Medicine

## 2022-06-26 DIAGNOSIS — J4520 Mild intermittent asthma, uncomplicated: Secondary | ICD-10-CM

## 2022-06-26 DIAGNOSIS — F5104 Psychophysiologic insomnia: Secondary | ICD-10-CM

## 2022-06-26 DIAGNOSIS — G43009 Migraine without aura, not intractable, without status migrainosus: Secondary | ICD-10-CM

## 2022-06-26 MED ORDER — EMGALITY 120 MG/ML ~~LOC~~ SOAJ
1.0000 mL | SUBCUTANEOUS | 5 refills | Status: DC
  Filled 2022-06-26: qty 1, 28d supply, fill #0
  Filled 2022-08-29: qty 1, 28d supply, fill #1
  Filled 2022-09-28: qty 1, 28d supply, fill #2
  Filled 2022-10-29 – 2022-11-07 (×3): qty 1, 28d supply, fill #3
  Filled 2022-11-07: qty 1, 30d supply, fill #3
  Filled 2022-11-12: qty 1, 28d supply, fill #3
  Filled 2022-12-19: qty 1, 28d supply, fill #4
  Filled 2023-01-24: qty 1, 28d supply, fill #5

## 2022-06-26 MED ORDER — ALBUTEROL SULFATE (2.5 MG/3ML) 0.083% IN NEBU
2.5000 mg | INHALATION_SOLUTION | Freq: Four times a day (QID) | RESPIRATORY_TRACT | 0 refills | Status: DC | PRN
Start: 2022-06-26 — End: 2022-07-02
  Filled 2022-06-26: qty 75, 7d supply, fill #0

## 2022-06-26 MED ORDER — PRAZOSIN HCL 1 MG PO CAPS
1.0000 mg | ORAL_CAPSULE | Freq: Every day | ORAL | 0 refills | Status: DC
Start: 2022-06-26 — End: 2022-08-10
  Filled 2022-06-26: qty 30, 30d supply, fill #0

## 2022-06-27 ENCOUNTER — Other Ambulatory Visit: Payer: Self-pay

## 2022-06-28 ENCOUNTER — Other Ambulatory Visit: Payer: Self-pay

## 2022-06-29 ENCOUNTER — Other Ambulatory Visit: Payer: Self-pay

## 2022-07-02 ENCOUNTER — Encounter: Payer: Self-pay | Admitting: Family Medicine

## 2022-07-02 ENCOUNTER — Other Ambulatory Visit: Payer: Self-pay

## 2022-07-02 ENCOUNTER — Ambulatory Visit (INDEPENDENT_AMBULATORY_CARE_PROVIDER_SITE_OTHER): Payer: 59 | Admitting: Family Medicine

## 2022-07-02 VITALS — BP 124/72 | HR 87 | Temp 98.0°F | Resp 14 | Ht 59.0 in | Wt 179.6 lb

## 2022-07-02 DIAGNOSIS — J4521 Mild intermittent asthma with (acute) exacerbation: Secondary | ICD-10-CM | POA: Diagnosis not present

## 2022-07-02 MED ORDER — HYDROCOD POLI-CHLORPHE POLI ER 10-8 MG/5ML PO SUER
5.0000 mL | Freq: Every evening | ORAL | 0 refills | Status: DC | PRN
Start: 2022-07-02 — End: 2022-08-10
  Filled 2022-07-02: qty 115, 23d supply, fill #0

## 2022-07-02 MED ORDER — MONTELUKAST SODIUM 10 MG PO TABS
10.0000 mg | ORAL_TABLET | Freq: Every day | ORAL | 3 refills | Status: DC
Start: 2022-07-02 — End: 2022-08-10
  Filled 2022-07-02: qty 30, 30d supply, fill #0
  Filled 2022-08-01: qty 30, 30d supply, fill #1

## 2022-07-02 MED ORDER — ALBUTEROL SULFATE (2.5 MG/3ML) 0.083% IN NEBU
2.5000 mg | INHALATION_SOLUTION | Freq: Four times a day (QID) | RESPIRATORY_TRACT | 1 refills | Status: DC | PRN
Start: 2022-07-02 — End: 2023-06-13
  Filled 2022-07-02: qty 75, 7d supply, fill #0
  Filled 2022-11-26: qty 75, 7d supply, fill #1

## 2022-07-02 NOTE — Progress Notes (Signed)
Name: Shelby Henson   MRN: [id number]    DOB: [birth year]   Date:07/02/2022       Progress Note  Subjective  Chief Complaint  Asthma  HPI  Asthma: she has been out of budesonide. She usually has two flares per year. Over the past 5 days she has noticed a dry cough and chest tightness. She has been using albuterol every 3-4 hours . She denies any preceding URI. She has very mild SOB, denies wheezing or fever. The most disturbing problem is the dry cough  Patient Active Problem List   Diagnosis Date Noted   Chronic right shoulder pain 05/11/2022   Morbid obesity (HCC) 01/05/2022   Rotator cuff impingement syndrome, right 09/29/2021   Biceps tendinitis, right 09/29/2021   Meralgia paresthetica of right side 03/29/2021   Psoas tendinitis, right hip 03/29/2021   History of total right hip arthroplasty 03/29/2021   Status post total replacement of right hip 02/19/2021   Meralgia paresthetica of right side 02/19/2021   Polyp of ascending colon 02/19/2021   Polyp of sigmoid colon 02/19/2021   Special screening for malignant neoplasms, colon    Polyp of ascending colon    Polyp of sigmoid colon    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 03/23/2020   Pain in right hip 01/12/2019   Vitamin D deficiency 02/25/2018   Chronic pain 10/24/2015   Hot flashes 10/24/2015   Migraine without aura and without status migrainosus, not intractable 10/24/2015   Metabolic syndrome 10/24/2015   Dyslipidemia 10/24/2015   Allergic rhinitis, seasonal 05/17/2015   Asthma, mild intermittent 08/08/2014   Chronic insomnia 08/08/2014   DDD (degenerative disc disease), lumbar 08/08/2014   Mood disorder (HCC) 08/08/2014   Gastro-esophageal reflux disease without esophagitis 08/08/2014   H/O total hysterectomy 08/08/2014   Irritable bowel syndrome with diarrhea 08/08/2014   Osteoarthritis of right hip 08/08/2014   Adult BMI 30+ 08/08/2014   SBO (spina bifida occulta) 08/08/2014    Past  Surgical History:  Procedure Laterality Date   ABLATION     BREAST CYST ASPIRATION Left    CESAREAN SECTION     CESAREAN SECTION     CESAREAN SECTION     COLONOSCOPY N/A 09/22/2020   Procedure: COLONOSCOPY;  Surgeon: Pasty Spillers, MD;  Location: [facility] ENDOSCOPY;  Service: Endoscopy;  Laterality: N/A;   HIP SURGERY  10/2018   LAPAROSCOPY  2018   LEEP  2005   benign pathology; Dr. Luella Cook   SUPRACERVICAL ABDOMINAL HYSTERECTOMY  2010   leio/AUB; Dr. Luella Cook   TOTAL HIP ARTHROPLASTY Right 11/12/2018   Procedure: TOTAL HIP ARTHROPLASTY ANTERIOR APPROACH;  Surgeon: Lyndle Herrlich, MD;  Location: [facility] ORS;  Service: Orthopedics;  Laterality: Right;    Family History  Problem Relation Age of Onset   Depression Mother    Bipolar disorder Brother    Colon cancer Maternal Grandfather        not sure of age   Breast cancer Neg Hx     Social History   Tobacco Use   Smoking status: Never   Smokeless tobacco: Never  Substance Use Topics   Alcohol use: Yes    Alcohol/week: 0.0 standard drinks of alcohol    Comment: occasionally     Current Outpatient Medications:    budesonide (PULMICORT) 0.5 MG/2ML nebulizer solution, Take 2 mLs (0.5 mg total) by nebulization 2 (two) times daily., Disp: 60 mL, Rfl: 5   chlorpheniramine-HYDROcodone (TUSSIONEX) 10-8 MG/5ML, Take 5 mLs  by mouth at bedtime as needed for cough., Disp: 115 mL, Rfl: 0   desvenlafaxine (PRISTIQ) 50 MG 24 hr tablet, Take 1 tablet (50 mg total) by mouth daily., Disp: 90 tablet, Rfl: 1   diclofenac Sodium (VOLTAREN) 1 % GEL, Apply 2 g topically 4 (four) times daily., Disp: 100 g, Rfl: 1   famotidine (PEPCID) 40 MG tablet, Take 1 tablet (40 mg total) by mouth daily., Disp: 90 tablet, Rfl: 1   gabapentin (NEURONTIN) 300 MG capsule, Take 3 capsules (900 mg total) by mouth at bedtime, Disp: 180 capsule, Rfl: 3   Galcanezumab-gnlm (EMGALITY) 120 MG/ML SOAJ, Inject 1 mL into the skin every 30 (thirty) days., Disp: 1 mL, Rfl:  5   lidocaine (LIDODERM) 5 %, Place 2 patches onto the skin daily. Remove & Discard patch within 12 hours or as directed by MD, Disp: 60 patch, Rfl: 2   montelukast (SINGULAIR) 10 MG tablet, Take 1 tablet (10 mg total) by mouth at bedtime., Disp: 30 tablet, Rfl: 3   prazosin (MINIPRESS) 1 MG capsule, Take 1 capsule (1 mg total) by mouth at bedtime., Disp: 30 capsule, Rfl: 0   albuterol (PROVENTIL) (2.5 MG/3ML) 0.083% nebulizer solution, Take 3 mLs (2.5 mg total) by nebulization every 6 (six) hours as needed for wheezing or shortness of breath, Disp: 75 mL, Rfl: 1  Allergies  Allergen Reactions   Celecoxib Hives   Prednisone     intolerance, makes her angry. Makes her angry   Topamax [Topiramate]     Mood changes   Zolpidem    Codeine Rash   Penicillins Rash    Did it involve swelling of the face/tongue/throat, SOB, or low BP? No Did it involve sudden or severe rash/hives, skin peeling, or any reaction on the inside of your mouth or nose? Yes Did you need to seek medical attention at a hospital or doctor's office? Yes When did it last happen? More than 20 years ago       If all above answers are "NO", may proceed with cephalosporin use.     I personally reviewed active problem list, medication list, allergies, family history, social history, health maintenance with the patient/caregiver today.   ROS  Ten systems reviewed and is negative except as mentioned in HPI   Objective  Vitals:   07/02/22 1127  BP: 124/72  Pulse: 87  Resp: 14  Temp: 98 F (36.7 C)  TempSrc: Oral  SpO2: 97%  Weight: 179 lb 9.6 oz (81.5 kg)  Height: 4\' 11"  (1.499 m)    Body mass index is 36.27 kg/m.  Physical Exam  Constitutional: Patient appears well-developed and well-nourished. Obese  No distress.  HEENT: head atraumatic, normocephalic, pupils equal and reactive to light, neck supple Cardiovascular: Normal rate, regular rhythm and normal heart sounds.  No murmur heard. No BLE  edema. Pulmonary/Chest: Effort normal and breath sounds normal. No respiratory distress. Abdominal: Soft.  There is no tenderness. Psychiatric: Patient has a normal mood and affect. behavior is normal. Judgment and thought content normal.    PHQ2/9:    05/11/2022   11:16 AM 01/05/2022    8:36 AM 11/03/2021   10:46 AM 08/25/2021    9:34 AM 08/03/2021    2:16 PM  Depression screen PHQ 2/9  Decreased Interest 0 0 0 0 0  Down, Depressed, Hopeless 0 0 0 0 0  PHQ - 2 Score 0 0 0 0 0  Altered sleeping 3 3 3 3 3   Tired, decreased energy  3 0 0 0 1  Change in appetite 1 0 0 3 0  Feeling bad or failure about yourself  0 0 0 0 0  Trouble concentrating 2 0 1 0 1  Moving slowly or fidgety/restless 0 0 0 0 0  Suicidal thoughts 0 0 0 0 0  PHQ-9 Score 9 3 4 6 5   Difficult doing work/chores Somewhat difficult    Not difficult at all    phq 9 is negative   Fall Risk:    07/02/2022   11:28 AM 05/11/2022   11:04 AM 01/05/2022    8:36 AM 11/03/2021   10:46 AM 08/25/2021    9:29 AM  Fall Risk   Falls in the past year? 0 0 0 0 0  Number falls in past yr:   0 0 0  Injury with Fall?   0 0 0  Risk for fall due to : No Fall Risks No Fall Risks No Fall Risks No Fall Risks No Fall Risks  Follow up Falls prevention discussed Falls prevention discussed Falls prevention discussed Falls prevention discussed Falls prevention discussed     Assessment & Plan  1. Asthma in adult, mild intermittent, with acute exacerbation  - albuterol (PROVENTIL) (2.5 MG/3ML) 0.083% nebulizer solution; Take 3 mLs (2.5 mg total) by nebulization every 6 (six) hours as needed for wheezing or shortness of breath  Dispense: 75 mL; Refill: 1 - montelukast (SINGULAIR) 10 MG tablet; Take 1 tablet (10 mg total) by mouth at bedtime.  Dispense: 30 tablet; Refill: 3 - chlorpheniramine-HYDROcodone (TUSSIONEX) 10-8 MG/5ML; Take 5 mLs by mouth at bedtime as needed for cough.  Dispense: 115 mL; Refill: 0  She cannot tolerate oral steroids

## 2022-07-03 ENCOUNTER — Other Ambulatory Visit: Payer: Self-pay

## 2022-07-03 ENCOUNTER — Other Ambulatory Visit: Payer: Self-pay | Admitting: Family Medicine

## 2022-07-11 ENCOUNTER — Encounter: Payer: Self-pay | Admitting: Family Medicine

## 2022-07-11 ENCOUNTER — Other Ambulatory Visit: Payer: Self-pay | Admitting: Family Medicine

## 2022-07-13 ENCOUNTER — Other Ambulatory Visit: Payer: Self-pay | Admitting: Family Medicine

## 2022-07-13 ENCOUNTER — Other Ambulatory Visit: Payer: Self-pay

## 2022-07-13 MED ORDER — AIRSUPRA 90-80 MCG/ACT IN AERO
2.0000 | INHALATION_SPRAY | Freq: Four times a day (QID) | RESPIRATORY_TRACT | 1 refills | Status: DC | PRN
  Filled 2022-07-13: qty 10.7, 25d supply, fill #0

## 2022-07-18 ENCOUNTER — Encounter: Payer: Self-pay | Admitting: Family Medicine

## 2022-07-30 ENCOUNTER — Other Ambulatory Visit: Payer: Self-pay

## 2022-07-30 ENCOUNTER — Encounter: Payer: Self-pay | Admitting: Family Medicine

## 2022-07-30 ENCOUNTER — Other Ambulatory Visit: Payer: Self-pay | Admitting: Family Medicine

## 2022-07-30 MED ORDER — METHOCARBAMOL 500 MG PO TABS
500.0000 mg | ORAL_TABLET | Freq: Four times a day (QID) | ORAL | 0 refills | Status: DC
  Filled 2022-07-30: qty 40, 10d supply, fill #0

## 2022-08-01 ENCOUNTER — Other Ambulatory Visit: Payer: Self-pay

## 2022-08-09 NOTE — Progress Notes (Signed)
Name: Shelby Henson   MRN: [id number]    DOB: [birth year]   Date:08/10/2022       Progress Note  Subjective  Chief Complaint  Follow Up  HPI  GERD: she was doing well, off PPI,  she takes famotidine daily and symptoms are controlled    Asthma moderate persistent: it used to be controlled with intermittent flares that responded to albuterol and budesonide ( she tried Advair years ago but felt that it did not help symptoms like nebulizing solution) , however over  the past few months had to use rescue inhaler often, for cough. She is ow on Airsuppra but symptoms still present , today we will add Trelegy - she cannot tolerate oral prednisone. She was advised to rinse mouth after using medication   Metabolic Syndrome/Obesity : She denies polyphagia, polydipsia or polyuria. She used to be morbidly obese with BMI above 35 with co-morbidities , GERD, OA of right hip, metabolic syndrome , however lost weight on Ozempic . Weight used to be in the 170 lbs range, however in 2021 it spiked to 193 lbs, we started her on Ozempic lost down to 170 lbs but Covid and did not feel well and stopped medication, weight went up again to 180 lbs and we resumed medication in Dec 2022. We gave ger [facility] in April 23 and her weight was 177 lbs she states the 1 mg dose made her unable to eat for weeks and she decided to stop taking it. Weight is stable again around 180 lbs    Chronic right hip pain: s/p replacement but still on modified duty at work and has daily pain, no longer seeing pain clinic, but sees Dr. Odis Luster once a year. She takes Meloxicam daily, but currently taking it mostly for her thumb tendinitis and arthritis, advised to switch to Voltaren.  Pain on her hip is described as burning, dull and aching, constant, still requires her to shift positions frequently to control symptoms. Seeing neurologist now , had MRI and has mild arthritis lumbar spine, and Dr. Sherryll Burger is ordering NCS and EMG it showed meralgia paresthetica  . She is on higher dose of gabapentin also had three  injections done by Dr. Ashley Royalty but failed the treatment and she was going to [facility] for ablation but did not a find a provider that would be able to do the procedure in [place].  She needs gabapentin refill , no longer seeing Dr. Sherryll Burger , takes it for meralgia paresthetica and chronic hip pain   Insomnia:  Lunesta, Seroquel, Temazepam and  Trazodone made her moody. Melatonin did not work. She is doing well on Prazosin able to fall and stay asleep    Mood swings :she has been on Pritiq for many years, she has a family history of bipolar She denies side effects, no anxiety symptoms,  phq 9 is normal today - only has fatigue   Low B12: last level was better and she did not return for B12 injections but explained she needs to continue getting it monthly    Migraine headache: she states symptoms usually intense , associated with nausea, photosensitivity, throbbing like and sometimes dull. Emgality is working for her, no episodes of migraine in months   Iron deficiency anemia: she cannot tolerate eating meat, hemoccult stools negative, colonoscopy up to date, she is taking iron pills and we will recheck labs today but she still feels tired, discussed getting TSH but she wants to hold off for now   Patient Active  Problem List   Diagnosis Date Noted   Iron deficiency anemia due to dietary causes 08/10/2022   Asthma, moderate persistent, poorly-controlled 08/10/2022   Chronic right shoulder pain 05/11/2022   Morbid obesity (HCC) 01/05/2022   Rotator cuff impingement syndrome, right 09/29/2021   Biceps tendinitis, right 09/29/2021   Meralgia paresthetica of right side 03/29/2021   Psoas tendinitis, right hip 03/29/2021   History of total right hip arthroplasty 03/29/2021   Status post total replacement of right hip 02/19/2021   Meralgia paresthetica of right side 02/19/2021   Polyp of ascending colon 02/19/2021   Polyp of sigmoid colon 02/19/2021    Special screening for malignant neoplasms, colon    Polyp of ascending colon    Polyp of sigmoid colon    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 03/23/2020   Pain in right hip 01/12/2019   Vitamin D deficiency 02/25/2018   Chronic pain 10/24/2015   Hot flashes 10/24/2015   Migraine without aura and without status migrainosus, not intractable 10/24/2015   Metabolic syndrome 10/24/2015   Dyslipidemia 10/24/2015   Allergic rhinitis, seasonal 05/17/2015   Asthma in adult, mild intermittent, with acute exacerbation 08/08/2014   Chronic insomnia 08/08/2014   DDD (degenerative disc disease), lumbar 08/08/2014   Mood disorder (HCC) 08/08/2014   Gastroesophageal reflux disease without esophagitis 08/08/2014   H/O total hysterectomy 08/08/2014   Irritable bowel syndrome with diarrhea 08/08/2014   Osteoarthritis of right hip 08/08/2014   Adult BMI 30+ 08/08/2014   SBO (spina bifida occulta) 08/08/2014    Past Surgical History:  Procedure Laterality Date   ABLATION     BREAST CYST ASPIRATION Left    CESAREAN SECTION     CESAREAN SECTION     CESAREAN SECTION     COLONOSCOPY N/A 09/22/2020   Procedure: COLONOSCOPY;  Surgeon: Pasty Spillers, MD;  Location: [facility] ENDOSCOPY;  Service: Endoscopy;  Laterality: N/A;   HIP SURGERY  10/2018   LAPAROSCOPY  2018   LEEP  2005   benign pathology; Dr. Luella Cook   SUPRACERVICAL ABDOMINAL HYSTERECTOMY  2010   leio/AUB; Dr. Luella Cook   TOTAL HIP ARTHROPLASTY Right 11/12/2018   Procedure: TOTAL HIP ARTHROPLASTY ANTERIOR APPROACH;  Surgeon: Lyndle Herrlich, MD;  Location: [facility] ORS;  Service: Orthopedics;  Laterality: Right;    Family History  Problem Relation Age of Onset   Depression Mother    Bipolar disorder Brother    Colon cancer Maternal Grandfather        not sure of age   Breast cancer Neg Hx     Social History   Tobacco Use   Smoking status: Never   Smokeless tobacco: Never  Substance Use Topics   Alcohol  use: Yes    Alcohol/week: 0.0 standard drinks of alcohol    Comment: occasionally     Current Outpatient Medications:    albuterol (PROVENTIL) (2.5 MG/3ML) 0.083% nebulizer solution, Take 3 mLs (2.5 mg total) by nebulization every 6 (six) hours as needed for wheezing or shortness of breath, Disp: 75 mL, Rfl: 1   Albuterol-Budesonide (AIRSUPRA) 90-80 MCG/ACT AERO, Inhale 2 puffs into the lungs 4 (four) times daily as needed., Disp: 10.7 g, Rfl: 1   diclofenac Sodium (VOLTAREN) 1 % GEL, Apply 2 g topically 4 (four) times daily., Disp: 100 g, Rfl: 1   Fluticasone-Umeclidin-Vilant (TRELEGY ELLIPTA) 100-62.5-25 MCG/ACT AEPB, Inhale 1 puff into the lungs daily., Disp: 1 each, Rfl: 2   Galcanezumab-gnlm (EMGALITY) 120 MG/ML SOAJ, Inject  1 mL into the skin every 30 (thirty) days., Disp: 1 mL, Rfl: 5   methocarbamol (ROBAXIN) 500 MG tablet, Take 1 tablet (500 mg total) by mouth 4 (four) times daily., Disp: 40 tablet, Rfl: 0   desvenlafaxine (PRISTIQ) 50 MG 24 hr tablet, Take 1 tablet (50 mg total) by mouth daily., Disp: 90 tablet, Rfl: 1   famotidine (PEPCID) 40 MG tablet, Take 1 tablet (40 mg total) by mouth daily., Disp: 90 tablet, Rfl: 1   gabapentin (NEURONTIN) 300 MG capsule, Take 3 capsules (900 mg total) by mouth at bedtime, Disp: 270 capsule, Rfl: 1   lidocaine (LIDODERM) 5 %, Place 2 patches onto the skin daily. Remove & Discard patch within 12 hours or as directed by MD (Patient not taking: Reported on 08/10/2022), Disp: 60 patch, Rfl: 2   montelukast (SINGULAIR) 10 MG tablet, Take 1 tablet (10 mg total) by mouth at bedtime., Disp: 90 tablet, Rfl: 1   prazosin (MINIPRESS) 1 MG capsule, Take 1 capsule (1 mg total) by mouth at bedtime., Disp: 90 capsule, Rfl: 1  Allergies  Allergen Reactions   Celecoxib Hives   Prednisone     intolerance, makes her angry. Makes her angry   Topamax [Topiramate]     Mood changes   Zolpidem    Codeine Rash   Penicillins Rash    Did it involve swelling of  the face/tongue/throat, SOB, or low BP? No Did it involve sudden or severe rash/hives, skin peeling, or any reaction on the inside of your mouth or nose? Yes Did you need to seek medical attention at a hospital or doctor's office? Yes When did it last happen? More than 20 years ago       If all above answers are "NO", may proceed with cephalosporin use.     I personally reviewed active problem list, medication list, allergies, family history, social history, health maintenance with the patient/caregiver today.   ROS  Constitutional: Negative for fever or weight change.  Respiratory: Negative for cough and shortness of breath.   Cardiovascular: Negative for chest pain or palpitations.  Gastrointestinal: Negative for abdominal pain, no bowel changes.  Musculoskeletal: Negative for gait problem or joint swelling.  Skin: Negative for rash.  Neurological: Negative for dizziness or headache.  No other specific complaints in a complete review of systems (except as listed in HPI above).   Objective  Vitals:   08/10/22 1101  BP: 120/76  Pulse: 96  Resp: 14  Temp: 98 F (36.7 C)  TempSrc: Oral  SpO2: 98%  Weight: 180 lb 14.4 oz (82.1 kg)  Height: 4\' 11"  (1.499 m)    Body mass index is 36.54 kg/m.  Physical Exam  Constitutional: Patient appears well-developed and well-nourished. Obese  No distress.  HEENT: head atraumatic, normocephalic, pupils equal and reactive to light, neck supple Cardiovascular: Normal rate, regular rhythm and normal heart sounds.  No murmur heard. No BLE edema. Pulmonary/Chest: Effort normal and breath sounds normal. No respiratory distress. Abdominal: Soft.  There is no tenderness. Psychiatric: Patient has a normal mood and affect. behavior is normal. Judgment and thought content normal.    PHQ2/9:    08/10/2022   11:01 AM 05/11/2022   11:16 AM 01/05/2022    8:36 AM 11/03/2021   10:46 AM 08/25/2021    9:34 AM  Depression screen PHQ 2/9  Decreased  Interest 0 0 0 0 0  Down, Depressed, Hopeless 0 0 0 0 0  PHQ - 2 Score 0 0 0  0 0  Altered sleeping 3 3 3 3 3   Tired, decreased energy 3 3 0 0 0  Change in appetite 0 1 0 0 3  Feeling bad or failure about yourself  0 0 0 0 0  Trouble concentrating 1 2 0 1 0  Moving slowly or fidgety/restless 0 0 0 0 0  Suicidal thoughts 0 0 0 0 0  PHQ-9 Score 7 9 3 4 6   Difficult doing work/chores Not difficult at all Somewhat difficult       phq 9 is negative   Fall Risk:    08/10/2022   11:01 AM 07/02/2022   11:28 AM 05/11/2022   11:04 AM 01/05/2022    8:36 AM 11/03/2021   10:46 AM  Fall Risk   Falls in the past year? 0 0 0 0 0  Number falls in past yr:    0 0  Injury with Fall?    0 0  Risk for fall due to : No Fall Risks No Fall Risks No Fall Risks No Fall Risks No Fall Risks  Follow up Falls prevention discussed Falls prevention discussed Falls prevention discussed Falls prevention discussed Falls prevention discussed      Functional Status Survey: Is the patient deaf or have difficulty hearing?: No Does the patient have difficulty seeing, even when wearing glasses/contacts?: No Does the patient have difficulty concentrating, remembering, or making decisions?: No Does the patient have difficulty walking or climbing stairs?: Yes Does the patient have difficulty dressing or bathing?: No Does the patient have difficulty doing errands alone such as visiting a doctor's office or shopping?: No    Assessment & Plan   1. Asthma, moderate persistent, poorly-controlled  - Ambulatory referral to Pulmonology - Fluticasone-Umeclidin-Vilant (TRELEGY ELLIPTA) 100-62.5-25 MCG/ACT AEPB; Inhale 1 puff into the lungs daily.  Dispense: 1 each; Refill: 2 - montelukast (SINGULAIR) 10 MG tablet; Take 1 tablet (10 mg total) by mouth at bedtime.  Dispense: 90 tablet; Refill: 1  2. Mood disorder (HCC)  - desvenlafaxine (PRISTIQ) 50 MG 24 hr tablet; Take 1 tablet (50 mg total) by mouth daily.  Dispense: 90  tablet; Refill: 1  3. Hot flashes  - desvenlafaxine (PRISTIQ) 50 MG 24 hr tablet; Take 1 tablet (50 mg total) by mouth daily.  Dispense: 90 tablet; Refill: 1  4. Gastroesophageal reflux disease without esophagitis  - famotidine (PEPCID) 40 MG tablet; Take 1 tablet (40 mg total) by mouth daily.  Dispense: 90 tablet; Refill: 1  5. Chronic insomnia  - prazosin (MINIPRESS) 1 MG capsule; Take 1 capsule (1 mg total) by mouth at bedtime.  Dispense: 90 capsule; Refill: 1  6. Migraine without aura and without status migrainosus, not intractable   7. Morbid obesity (HCC)  Discussed with the patient the risk posed by an increased BMI. Discussed importance of portion control, calorie counting and at least 150 minutes of physical activity weekly. Avoid sweet beverages and drink more water. Eat at least 6 servings of fruit and vegetables daily    8. Meralgia paresthetica of right side  - gabapentin (NEURONTIN) 300 MG capsule; Take 3 capsules (900 mg total) by mouth at bedtime  Dispense: 270 capsule; Refill: 1  9. Iron deficiency anemia due to dietary causes  - CBC with Differential/Platelet - Iron, TIBC and Ferritin Panel

## 2022-08-10 ENCOUNTER — Other Ambulatory Visit: Payer: Self-pay

## 2022-08-10 ENCOUNTER — Encounter: Payer: Self-pay | Admitting: Family Medicine

## 2022-08-10 ENCOUNTER — Ambulatory Visit (INDEPENDENT_AMBULATORY_CARE_PROVIDER_SITE_OTHER): Payer: 59 | Admitting: Family Medicine

## 2022-08-10 VITALS — BP 120/76 | HR 96 | Temp 98.0°F | Resp 14 | Ht 59.0 in | Wt 180.9 lb

## 2022-08-10 DIAGNOSIS — J4540 Moderate persistent asthma, uncomplicated: Secondary | ICD-10-CM | POA: Diagnosis not present

## 2022-08-10 DIAGNOSIS — G5711 Meralgia paresthetica, right lower limb: Secondary | ICD-10-CM

## 2022-08-10 DIAGNOSIS — K219 Gastro-esophageal reflux disease without esophagitis: Secondary | ICD-10-CM | POA: Diagnosis not present

## 2022-08-10 DIAGNOSIS — F5104 Psychophysiologic insomnia: Secondary | ICD-10-CM | POA: Diagnosis not present

## 2022-08-10 DIAGNOSIS — G43009 Migraine without aura, not intractable, without status migrainosus: Secondary | ICD-10-CM | POA: Diagnosis not present

## 2022-08-10 DIAGNOSIS — E538 Deficiency of other specified B group vitamins: Secondary | ICD-10-CM

## 2022-08-10 DIAGNOSIS — R232 Flushing: Secondary | ICD-10-CM | POA: Diagnosis not present

## 2022-08-10 DIAGNOSIS — F39 Unspecified mood [affective] disorder: Secondary | ICD-10-CM

## 2022-08-10 DIAGNOSIS — D508 Other iron deficiency anemias: Secondary | ICD-10-CM | POA: Diagnosis not present

## 2022-08-10 LAB — CBC WITH DIFFERENTIAL/PLATELET
Basophils Absolute: 49 cells/uL (ref 0–200)
Eosinophils Absolute: 897 cells/uL — ABNORMAL HIGH (ref 15–500)
HCT: 36.4 % (ref 35.0–45.0)
Hemoglobin: 11.2 g/dL — ABNORMAL LOW (ref 11.7–15.5)
MCHC: 30.8 g/dL — ABNORMAL LOW (ref 32.0–36.0)
Neutro Abs: 2574 cells/uL (ref 1500–7800)
Platelets: 273 10*3/uL (ref 140–400)
Total Lymphocyte: 33.1 %

## 2022-08-10 MED ORDER — GABAPENTIN 300 MG PO CAPS
900.0000 mg | ORAL_CAPSULE | Freq: Every day | ORAL | 1 refills | Status: DC
Start: 2022-08-10 — End: 2022-12-14
  Filled 2022-08-10: qty 270, 90d supply, fill #0
  Filled 2022-11-12: qty 270, 90d supply, fill #1

## 2022-08-10 MED ORDER — MONTELUKAST SODIUM 10 MG PO TABS
10.0000 mg | ORAL_TABLET | Freq: Every day | ORAL | 1 refills | Status: DC
Start: 2022-08-10 — End: 2022-12-13
  Filled 2022-08-10: qty 90, 90d supply, fill #0

## 2022-08-10 MED ORDER — PRAZOSIN HCL 1 MG PO CAPS
1.0000 mg | ORAL_CAPSULE | Freq: Every day | ORAL | 1 refills | Status: DC
Start: 2022-08-10 — End: 2022-12-14
  Filled 2022-08-10: qty 90, 90d supply, fill #0
  Filled 2022-11-12: qty 90, 90d supply, fill #1

## 2022-08-10 MED ORDER — FAMOTIDINE 40 MG PO TABS
40.0000 mg | ORAL_TABLET | Freq: Every day | ORAL | 1 refills | Status: DC
Start: 2022-08-10 — End: 2023-04-04
  Filled 2022-08-10 – 2022-08-29 (×2): qty 90, 90d supply, fill #0
  Filled 2022-12-19: qty 90, 90d supply, fill #1

## 2022-08-10 MED ORDER — TRELEGY ELLIPTA 100-62.5-25 MCG/ACT IN AEPB
1.0000 | INHALATION_SPRAY | Freq: Every day | RESPIRATORY_TRACT | 2 refills | Status: DC
Start: 2022-08-10 — End: 2022-09-04
  Filled 2022-08-10: qty 60, 30d supply, fill #0

## 2022-08-10 MED ORDER — DESVENLAFAXINE SUCCINATE ER 50 MG PO TB24
50.0000 mg | ORAL_TABLET | Freq: Every day | ORAL | 1 refills | Status: DC
Start: 2022-08-10 — End: 2023-04-04
  Filled 2022-08-10 – 2022-08-29 (×2): qty 90, 90d supply, fill #0
  Filled 2022-12-19: qty 90, 90d supply, fill #1

## 2022-08-10 MED ORDER — CYANOCOBALAMIN 1000 MCG/ML IJ SOLN
1000.0000 ug | Freq: Once | INTRAMUSCULAR | Status: AC
Start: 2022-08-10 — End: 2022-08-10
  Administered 2022-08-10: 1000 ug via INTRAMUSCULAR

## 2022-08-10 NOTE — Addendum Note (Signed)
Addended by: Benay Pike on: 08/10/2022 11:46 AM   Modules accepted: Orders

## 2022-08-11 LAB — CBC WITH DIFFERENTIAL/PLATELET
Absolute Monocytes: 561 cells/uL (ref 200–950)
Basophils Relative: 0.8 %
Eosinophils Relative: 14.7 %
Lymphs Abs: 2019 cells/uL (ref 850–3900)
MCH: 24.6 pg — ABNORMAL LOW (ref 27.0–33.0)
MCV: 80 fL (ref 80.0–100.0)
MPV: 11.3 fL (ref 7.5–12.5)
Monocytes Relative: 9.2 %
Neutrophils Relative %: 42.2 %
RBC: 4.55 10*6/uL (ref 3.80–5.10)
RDW: 17.5 % — ABNORMAL HIGH (ref 11.0–15.0)
WBC: 6.1 10*3/uL (ref 3.8–10.8)

## 2022-08-11 LAB — IRON,TIBC AND FERRITIN PANEL
%SAT: 12 % (calc) — ABNORMAL LOW (ref 16–45)
Ferritin: 28 ng/mL (ref 16–232)
Iron: 50 ug/dL (ref 45–160)
TIBC: 420 mcg/dL (calc) (ref 250–450)

## 2022-08-12 ENCOUNTER — Encounter: Payer: Self-pay | Admitting: Family Medicine

## 2022-08-13 ENCOUNTER — Encounter: Payer: Self-pay | Admitting: Family Medicine

## 2022-08-13 ENCOUNTER — Other Ambulatory Visit: Payer: Self-pay

## 2022-08-13 DIAGNOSIS — M7521 Bicipital tendinitis, right shoulder: Secondary | ICD-10-CM

## 2022-08-13 DIAGNOSIS — G8929 Other chronic pain: Secondary | ICD-10-CM

## 2022-08-14 ENCOUNTER — Encounter: Payer: Self-pay | Admitting: Family Medicine

## 2022-08-15 ENCOUNTER — Telehealth: Payer: Self-pay

## 2022-08-15 NOTE — Telephone Encounter (Signed)
Voicemail unavailable. Attempted to reach patient about AirSupra RX coupon that she can come by and pick up to help with the cost of the medication.

## 2022-08-16 ENCOUNTER — Other Ambulatory Visit: Payer: Self-pay

## 2022-08-20 ENCOUNTER — Encounter: Payer: Self-pay | Admitting: Family Medicine

## 2022-08-20 NOTE — Telephone Encounter (Signed)
Maybe Poggi for her?

## 2022-08-20 NOTE — Telephone Encounter (Signed)
Can you send her referral to [facility]

## 2022-08-21 ENCOUNTER — Other Ambulatory Visit: Payer: Self-pay

## 2022-08-21 ENCOUNTER — Other Ambulatory Visit: Payer: Self-pay | Admitting: Family Medicine

## 2022-08-21 DIAGNOSIS — M25531 Pain in right wrist: Secondary | ICD-10-CM

## 2022-08-21 MED ORDER — DICLOFENAC SODIUM 1 % EX GEL
2.0000 g | Freq: Four times a day (QID) | CUTANEOUS | 1 refills | Status: DC
  Filled 2022-08-29: qty 100, 12d supply, fill #0
  Filled 2022-09-28: qty 100, 12d supply, fill #1

## 2022-08-29 ENCOUNTER — Other Ambulatory Visit: Payer: Self-pay

## 2022-08-30 ENCOUNTER — Other Ambulatory Visit: Payer: Self-pay

## 2022-08-31 ENCOUNTER — Other Ambulatory Visit: Payer: Self-pay

## 2022-08-31 ENCOUNTER — Encounter: Payer: Self-pay | Admitting: Family Medicine

## 2022-08-31 DIAGNOSIS — G8929 Other chronic pain: Secondary | ICD-10-CM

## 2022-08-31 DIAGNOSIS — M7611 Psoas tendinitis, right hip: Secondary | ICD-10-CM

## 2022-08-31 DIAGNOSIS — M7521 Bicipital tendinitis, right shoulder: Secondary | ICD-10-CM

## 2022-09-03 ENCOUNTER — Other Ambulatory Visit: Payer: Self-pay

## 2022-09-03 ENCOUNTER — Institutional Professional Consult (permissible substitution): Payer: 59 | Admitting: Pulmonary Disease

## 2022-09-04 ENCOUNTER — Other Ambulatory Visit: Payer: Self-pay

## 2022-09-04 ENCOUNTER — Ambulatory Visit
Admission: RE | Admit: 2022-09-04 | Discharge: 2022-09-04 | Disposition: A | Discharge status: Home / Self Care | Payer: 59 | Attending: Pulmonary Disease | Admitting: Pulmonary Disease

## 2022-09-04 ENCOUNTER — Ambulatory Visit (INDEPENDENT_AMBULATORY_CARE_PROVIDER_SITE_OTHER): Payer: 59 | Admitting: Pulmonary Disease

## 2022-09-04 ENCOUNTER — Encounter: Payer: Self-pay | Admitting: Pulmonary Disease

## 2022-09-04 ENCOUNTER — Other Ambulatory Visit: Admission: RE | Admit: 2022-09-04 | Payer: 59 | Source: Home / Self Care

## 2022-09-04 ENCOUNTER — Ambulatory Visit: Admission: RE | Admit: 2022-09-04 | Payer: 59 | Source: Ambulatory Visit

## 2022-09-04 VITALS — BP 126/72 | HR 82 | Temp 97.9°F | Ht 59.0 in | Wt 184.0 lb

## 2022-09-04 DIAGNOSIS — J4540 Moderate persistent asthma, uncomplicated: Secondary | ICD-10-CM

## 2022-09-04 DIAGNOSIS — R0602 Shortness of breath: Secondary | ICD-10-CM | POA: Diagnosis not present

## 2022-09-04 DIAGNOSIS — J45909 Unspecified asthma, uncomplicated: Secondary | ICD-10-CM | POA: Diagnosis not present

## 2022-09-04 DIAGNOSIS — R059 Cough, unspecified: Secondary | ICD-10-CM | POA: Diagnosis not present

## 2022-09-04 MED ORDER — TRELEGY ELLIPTA 200-62.5-25 MCG/ACT IN AEPB
1.0000 | INHALATION_SPRAY | Freq: Every day | RESPIRATORY_TRACT | 6 refills | Status: DC
  Filled 2022-09-04: qty 60, 30d supply, fill #0
  Filled 2022-10-09 – 2022-10-24 (×2): qty 60, 30d supply, fill #1
  Filled 2022-12-19: qty 60, 30d supply, fill #2
  Filled 2023-01-11 – 2023-01-24 (×2): qty 60, 30d supply, fill #3
  Filled 2023-03-04: qty 60, 30d supply, fill #4
  Filled 2023-07-08: qty 60, 30d supply, fill #5

## 2022-09-04 MED ORDER — PANTOPRAZOLE SODIUM 40 MG PO TBEC
40.0000 mg | DELAYED_RELEASE_TABLET | Freq: Every day | ORAL | 1 refills | Status: DC
Start: 2022-09-04 — End: 2022-12-19
  Filled 2022-09-04: qty 30, 30d supply, fill #0
  Filled 2022-10-29: qty 30, 30d supply, fill #1

## 2022-09-04 NOTE — Progress Notes (Unsigned)
Synopsis: Referred in by Alba Cory, MD   Subjective:   PATIENT ID: Lucien Mons GENDER: female DOB: 1970-09-28, MRN: [id number]  Chief Complaint  Patient presents with   pulmonary consult    Hx of asthma- prod cough with clear sputum.     HPI Ms Puglise is a 52 y.o female patient with a past medical history of moderate persistent asthma and GERD presenting to the pulmonary clinic today for further management of her asthma.   She had asthma as a child which she grew out of and then symptoms restarted in her early 44s. She was on pulmicort nebulizer and albuterol as needed. She never required hospitalization and symptoms manifest as dry cough usually.   For the past 4 months she started having a dry cough that persisted. She was trialed on [place] with minimal benefit. About a week ago she was started on Trelegy Ellipta 100 1 puff daily which seems to be helping.    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 No travel outside the [place]. She has 3 kids who are healthy.   ROS All systems were reviewed and are negative except for the above.  Objective:   Vitals:   09/04/22 0921  BP: 126/72  Pulse: 82  Temp: 97.9 F (36.6 C)  TempSrc: Temporal  SpO2: 98%  Weight: 184 lb (83.5 kg)  Height: 4\' 11"  (1.499 m)   98% on RA BMI Readings from Last 3 Encounters:  09/04/22 37.16 kg/m  08/10/22 36.54 kg/m  07/02/22 36.27 kg/m   Wt Readings from Last 3 Encounters:  09/04/22 184 lb (83.5 kg)  08/10/22 180 lb 14.4 oz (82.1 kg)  07/02/22 179 lb 9.6 oz (81.5 kg)    Physical Exam GEN: NAD, Healthy Appearing HEENT: Supple Neck, Reactive Pupils, EOMI  CVS: Normal S1, Normal S2, RRR, No murmurs or ES appreciated  Lungs: Clear bilateral air entry.  Abdomen: Soft, non tender, non distended, + BS  Extremities: Warm and well perfused, No edema  Skin: No suspicious lesions appreciated  Psych: Normal  Affect  Ancillary Information   CBC    Component Value Date/Time   WBC 6.1 08/10/2022 1150   RBC 4.55 08/10/2022 1150   HGB 11.2 (L) 08/10/2022 1150   HGB 12.2 08/04/2018 1557   HCT 36.4 08/10/2022 1150   HCT 36.3 08/04/2018 1557   PLT 273 08/10/2022 1150   PLT 208 08/04/2018 1557   MCV 80.0 08/10/2022 1150   MCV 82 08/04/2018 1557   MCV 86 08/07/2011 2024   MCH 24.6 (L) 08/10/2022 1150   MCHC 30.8 (L) 08/10/2022 1150   RDW 17.5 (H) 08/10/2022 1150   RDW 13.6 08/04/2018 1557   RDW 13.8 08/07/2011 2024   LYMPHSABS 2,019 08/10/2022 1150   LYMPHSABS 2.1 05/20/2015 0824   LYMPHSABS 2.6 08/07/2011 2024   MONOABS 0.5 12/08/2019 1633   MONOABS 0.6 08/07/2011 2024   EOSABS 897 (H) 08/10/2022 1150   EOSABS 1.1 (H) 05/20/2015 0824   EOSABS 1.0 (H) 08/07/2011 2024   BASOSABS 49 08/10/2022 1150   BASOSABS 0.0 05/20/2015 0824   BASOSABS 0.1 08/07/2011 2024        No data to display           Assessment & Plan:  Ms Devivo is a 52 y.o female patient with a past medical history of moderate persistent asthma and GERD presenting to the pulmonary clinic today for further  management of her asthma.   #Moderate Persistent Asthma  Manifests as dry cough. Elevated EOS at 1100.   []  PFT  []  Strongyloides Serology  []  IgE and Allergen testing []  Increase Fluticasone-Umeclidinum-Vilanterol [Trelegy Ellipta] to 253mcg-62.32mcg-25mcg 1 puff daily.  []  Continue with Montelukast [Singulair] 10mg  1tab daily.  #GERD Currently on famotidine 40mg  1tab daily.   []  Start Pantoprazole 40mg  1 tab PO daily  []  Decrease Famotidine to 20mg  1tab daily  []  Advise on lifestyle modifications.   Return in about 4 weeks (around 10/02/2022).  I spent 60 minutes caring for this patient today, including preparing to see the patient, obtaining a medical history , reviewing a separately obtained history, performing a medically appropriate examination and/or evaluation, counseling and educating the  patient/family/caregiver, ordering medications, tests, or procedures, documenting clinical information in the electronic health record, and independently interpreting results (not separately reported/billed) and communicating results to the patient/family/caregiver  Janann Colonel, MD [facility] 09/04/2022 9:53 AM

## 2022-09-05 ENCOUNTER — Encounter: Payer: Self-pay | Admitting: Pulmonary Disease

## 2022-09-09 ENCOUNTER — Encounter: Payer: Self-pay | Admitting: Pulmonary Disease

## 2022-09-11 ENCOUNTER — Encounter: Payer: Self-pay | Admitting: Student in an Organized Health Care Education/Training Program

## 2022-09-11 ENCOUNTER — Ambulatory Visit
Payer: 59 | Attending: Student in an Organized Health Care Education/Training Program | Admitting: Student in an Organized Health Care Education/Training Program

## 2022-09-11 VITALS — BP 136/88 | HR 83 | Temp 98.0°F | Resp 16 | Ht 59.0 in | Wt 174.0 lb

## 2022-09-11 DIAGNOSIS — G8929 Other chronic pain: Secondary | ICD-10-CM | POA: Insufficient documentation

## 2022-09-11 DIAGNOSIS — M7591 Shoulder lesion, unspecified, right shoulder: Secondary | ICD-10-CM | POA: Diagnosis not present

## 2022-09-11 DIAGNOSIS — M25511 Pain in right shoulder: Secondary | ICD-10-CM | POA: Insufficient documentation

## 2022-09-11 DIAGNOSIS — M7581 Other shoulder lesions, right shoulder: Secondary | ICD-10-CM | POA: Insufficient documentation

## 2022-09-11 NOTE — Progress Notes (Signed)
PROVIDER NOTE: Information contained herein reflects review and annotations entered in association with encounter. Interpretation of such information and data should be left to medically-trained personnel. Information provided to patient can be located elsewhere in the medical record under "Patient Instructions". Document created using STT-dictation technology, any transcriptional errors that may result from process are unintentional.    Patient: Shelby Henson  Service Category: E/M  Provider: Edward Jolly, MD  DOB: December 30, 1970  DOS: 09/11/2022  Referring Provider: Jerrol Banana, MD  MRN: [id number]  Specialty: Interventional Pain Management  PCP: Alba Cory, MD  Type: Established Patient  Setting: Ambulatory outpatient    Location: Office  Delivery: Face-to-face     HPI  [name], a 52 y.o. year old female, is here today because of her Supraspinatus tendonitis, right [M75.91]. [name] primary complain today is Shoulder Pain (right)   Pain Assessment: Severity of Chronic pain is reported as a 4 /10. Location: Shoulder Right/to right clavicle. Onset: More than a month ago. Quality: Aching, Burning, Sharp, Shooting, Dull. Timing: Constant. Modifying factor(s): diclofenac gel. Vitals:  height is 4\' 11"  (1.499 m) and weight is 174 lb (78.9 kg). Her temperature is 98 F (36.7 C). Her blood pressure is 136/88 and her pulse is 83. Her respiration is 16 and oxygen saturation is 97%.  BMI: Estimated body mass index is 35.14 kg/m as calculated from the following:   Height as of this encounter: 4\' 11"  (1.499 m).   Weight as of this encounter: 174 lb (78.9 kg). Last encounter: Visit date not found. Last procedure: Visit date not found.  Reason for encounter:  right shoulder pain .   Patient presents today with persistent and severe right shoulder pain.  She states that the pain has been going on for many months.  She has had trigger point injections and steroid injections done into her  shoulder without much benefit.  She had a recent right shoulder MRI that shows supraspinatus tendinosis and rotator cuff arthropathy.  She would like to avoid surgery if possible.  She is here to discuss other options to help manage her right shoulder pain.    ROS  Constitutional: Denies any fever or chills Gastrointestinal: No reported hemesis, hematochezia, vomiting, or acute GI distress Musculoskeletal:  Right shoulder pain Neurological: No reported episodes of acute onset apraxia, aphasia, dysarthria, agnosia, amnesia, paralysis, loss of coordination, or loss of consciousness  Medication Review  Albuterol-Budesonide, Fluticasone-Umeclidin-Vilant, Galcanezumab-gnlm, albuterol, desvenlafaxine, diclofenac Sodium, famotidine, gabapentin, lidocaine, methocarbamol, montelukast, pantoprazole, and prazosin  History Review  Allergy: [name] is allergic to celecoxib, prednisone, topamax [topiramate], zolpidem, codeine, and penicillins. Drug: [name]  reports no history of drug use. Alcohol:  reports current alcohol use. Tobacco:  reports that she has never smoked. She has never used smokeless tobacco. Social: [name]  reports that she has never smoked. She has never used smokeless tobacco. She reports current alcohol use. She reports that she does not use drugs. Medical:  has a past medical history of Anemia, Asthma, Complication of anesthesia, Depression, GERD (gastroesophageal reflux disease), Migraine, Morbid obesity (HCC), Osteoarthritis, and PONV (postoperative nausea and vomiting). Surgical: Shelby Henson  has a past surgical history that includes Supracervical abdominal hysterectomy (2010); Cesarean section; Cesarean section; Ablation; LEEP (2005); laparoscopy (2018); Breast cyst aspiration (Left); Cesarean section; Total hip arthroplasty (Right, 11/12/2018); Hip surgery (10/2018); and Colonoscopy (N/A, 09/22/2020). Family: family history includes Bipolar disorder in her brother; Colon  cancer in her maternal grandfather; Depression in her mother.  Laboratory Chemistry Profile   Renal Lab Results  Component Value Date   BUN 15 05/11/2022   CREATININE 0.70 05/11/2022   BCR SEE NOTE: 05/11/2022   GFRAA 122 09/08/2019   GFRNONAA >60 12/08/2019    Hepatic Lab Results  Component Value Date   AST 26 05/11/2022   ALT 25 05/11/2022   ALBUMIN 4.1 09/17/2018   ALKPHOS 94 09/17/2018   HCVAB 0.1 09/17/2018    Electrolytes Lab Results  Component Value Date   NA 141 05/11/2022   K 4.2 05/11/2022   CL 104 05/11/2022   CALCIUM 9.9 05/11/2022    Bone Lab Results  Component Value Date   VD25OH 23 (L) 05/11/2022    Inflammation (CRP: Acute Phase) (ESR: Chronic Phase) No results found for: "CRP", "ESRSEDRATE", "LATICACIDVEN"       Note: Above Lab results reviewed.  Recent Imaging Review  CLINICAL DATA:  Right shoulder pain for 2 months. Limited range of motion. No known injury.   EXAM: MRI OF THE RIGHT SHOULDER WITHOUT CONTRAST   TECHNIQUE: Multiplanar, multisequence MR imaging of the shoulder was performed. No intravenous contrast was administered.   COMPARISON:  None Available.   FINDINGS: Rotator cuff: Moderate tendinosis of the supraspinatus tendon with a small partial-thickness bursal surface tear. Moderate tendinosis of the infraspinatus tendon. Teres minor tendon is intact. Subscapularis tendon is intact.   Muscles: No muscle atrophy or edema. No intramuscular fluid collection or hematoma.   Biceps Long Head: Intraarticular and extraarticular portions of the biceps tendon are intact.   Acromioclavicular Joint: Mild arthropathy of the acromioclavicular joint. No subacromial/subdeltoid bursal fluid.   Glenohumeral Joint: No joint effusion. Mild partial-thickness cartilage loss of the glenohumeral joint.   Labrum: Grossly intact, but evaluation is limited by lack of intraarticular fluid/contrast.   Bones: No fracture or dislocation. No  aggressive osseous lesion.   Other: No fluid collection or hematoma.   IMPRESSION: 1. Moderate tendinosis of the supraspinatus tendon with a small partial-thickness bursal surface tear. 2. Moderate tendinosis of the infraspinatus tendon. 3. Mild osteoarthritis of the glenohumeral joint.     Electronically Signed   By: Elige Ko M.D.   On: 05/24/2022 06:05        DG Chest 2 View CLINICAL DATA:  Cough, short of breath, asthma  EXAM: CHEST - 2 VIEW  COMPARISON:  12/08/2019  FINDINGS: Frontal and lateral views of the chest demonstrate an unremarkable cardiac silhouette. No acute airspace disease, effusion, or pneumothorax. No acute bony abnormalities.  IMPRESSION: 1. No acute intrathoracic process.  Electronically Signed   By: Sharlet Salina M.D.   On: 09/09/2022 17:04 Note: Reviewed        Physical Exam  General appearance: Well nourished, well developed, and well hydrated. In no apparent acute distress Mental status: Alert, oriented x 3 (person, place, & time)       Respiratory: No evidence of acute respiratory distress Eyes: PERLA Vitals: BP 136/88   Pulse 83   Temp 98 F (36.7 C)   Resp 16   Ht 4\' 11"  (1.499 m)   Wt 174 lb (78.9 kg)   SpO2 97%   BMI 35.14 kg/m  BMI: Estimated body mass index is 35.14 kg/m as calculated from the following:   Height as of this encounter: 4\' 11"  (1.499 m).   Weight as of this encounter: 174 lb (78.9 kg). Ideal: Patient must be at least 60 in tall to calculate ideal body weight  Upper Extremity (UE) Exam  Side: Right upper extremity  Side: Left upper extremity  Skin & Extremity Inspection: Skin color, temperature, and hair growth are WNL. No peripheral edema or cyanosis. No masses, redness, swelling, asymmetry, or associated skin lesions. No contractures.  Skin & Extremity Inspection: Skin color, temperature, and hair growth are WNL. No peripheral edema or cyanosis. No masses, redness, swelling, asymmetry, or  associated skin lesions. No contractures.  Functional ROM: Pain restricted ROM for shoulder  Functional ROM: Unrestricted ROM          Muscle Tone/Strength: Functionally intact. No obvious neuro-muscular anomalies detected.  Muscle Tone/Strength: Functionally intact. No obvious neuro-muscular anomalies detected.  Sensory (Neurological): Arthropathic arthralgia          Sensory (Neurological): Unimpaired          Palpation: No palpable anomalies              Palpation: No palpable anomalies              Provocative Test(s):  Phalen's test: deferred Tinel's test: deferred Apley's scratch test (touch opposite shoulder):  Action 1 (Across chest): Decreased ROM Action 2 (Overhead): Decreased ROM Action 3 (LB reach): Decreased ROM   Provocative Test(s):  Phalen's test: deferred Tinel's test: deferred Apley's scratch test (touch opposite shoulder):  Action 1 (Across chest): deferred Action 2 (Overhead): deferred Action 3 (LB reach): deferred     Assessment   Diagnosis Status  1. Supraspinatus tendonitis, right   2. Right rotator cuff tendonitis   3. Chronic right shoulder pain    Persistent Persistent Persistent   Updated Problems: Problem  Right Rotator Cuff Tendonitis    Plan of Care  Plan for right suprascapular nerve block.  Risks and benefits reviewed and patient like to proceed.  We also discussed radiofrequency ablation of the suprascapular nerve.  Has a positive response to her nerve block.  Orders:  Orders Placed This Encounter  Procedures   SUPRASCAPULAR NERVE BLOCK    For shoulder pain.    Standing Status:   Future    Standing Expiration Date:   12/12/2022    Scheduling Instructions:     Purpose: Right      Level(s): Suprascapular notch     Sedation: local     Scheduling Timeframe: As permitted by the schedule    Order Specific Question:   Where will this procedure be performed?    Answer:   [facility]   Follow-up plan:   Return in about 8 days  (around 09/19/2022) for Right SSNB, in clinic [facility].      Recent Visits No visits were found meeting these conditions. Showing recent visits within past 90 days and meeting all other requirements Today's Visits Date Type Provider Dept  09/11/22 Office Visit Edward Jolly, MD [facility]  Showing today's visits and meeting all other requirements Future Appointments Date Type Provider Dept  09/19/22 Appointment Edward Jolly, MD [facility]  Showing future appointments within next 90 days and meeting all other requirements  I discussed the assessment and treatment plan with the patient. The patient was provided an opportunity to ask questions and all were answered. The patient agreed with the plan and demonstrated an understanding of the instructions.  Patient advised to call back or seek an in-person evaluation if the symptoms or condition worsens.  Duration of encounter: .  Total time on encounter, as per AMA guidelines included both the face-to-face and non-face-to-face time personally spent by the physician and/or other qualified health care  professional(s) on the day of the encounter (includes time in activities that require the physician or other qualified health care professional and does not include time in activities normally performed by clinical staff). Physician's time may include the following activities when performed: Preparing to see the patient (e.g., pre-charting review of records, searching for previously ordered imaging, lab work, and nerve conduction tests) Review of prior analgesic pharmacotherapies. Reviewing PMP Interpreting ordered tests (e.g., lab work, imaging, nerve conduction tests) Performing post-procedure evaluations, including interpretation of diagnostic procedures Obtaining and/or reviewing separately obtained history Performing a medically appropriate examination and/or evaluation Counseling and educating the  patient/family/caregiver Ordering medications, tests, or procedures Referring and communicating with other health care professionals (when not separately reported) Documenting clinical information in the electronic or other health record Independently interpreting results (not separately reported) and communicating results to the patient/ family/caregiver Care coordination (not separately reported)  Note by: Edward Jolly, MD Date: 09/11/2022; Time: 9:30 AM

## 2022-09-11 NOTE — Progress Notes (Signed)
Safety precautions to be maintained throughout the outpatient stay will include: orient to surroundings, keep bed in low position, maintain call bell within reach at all times, provide assistance with transfer out of bed and ambulation.  

## 2022-09-11 NOTE — Patient Instructions (Signed)
GENERAL RISKS AND COMPLICATIONS ° °What are the risk, side effects and possible complications? °Generally speaking, most procedures are safe.  However, with any procedure there are risks, side effects, and the possibility of complications.  The risks and complications are dependent upon the sites that are lesioned, or the type of nerve block to be performed.  The closer the procedure is to the spine, the more serious the risks are.  Great care is taken when placing the radio frequency needles, block needles or lesioning probes, but sometimes complications can occur. °Infection: Any time there is an injection through the skin, there is a risk of infection.  This is why sterile conditions are used for these blocks.  There are four possible types of infection. °Localized skin infection. °Central Nervous System Infection-This can be in the form of Meningitis, which can be deadly. °Epidural Infections-This can be in the form of an epidural abscess, which can cause pressure inside of the spine, causing compression of the spinal cord with subsequent paralysis. This would require an emergency surgery to decompress, and there are no guarantees that the patient would recover from the paralysis. °Discitis-This is an infection of the intervertebral discs.  It occurs in about 1% of discography procedures.  It is difficult to treat and it may lead to surgery. ° °      2. Pain: the needles have to go through skin and soft tissues, will cause soreness. °      3. Damage to internal structures:  The nerves to be lesioned may be near blood vessels or   ° other nerves which can be potentially damaged. °      4. Bleeding: Bleeding is more common if the patient is taking blood thinners such as  aspirin, Coumadin, Ticiid, Plavix, etc., or if he/she have some genetic predisposition  such as hemophilia. Bleeding into the spinal canal can cause compression of the spinal  cord with subsequent paralysis.  This would require an emergency  surgery to  decompress and there are no guarantees that the patient would recover from the  paralysis. °      5. Pneumothorax:  Puncturing of a lung is a possibility, every time a needle is introduced in  the area of the chest or upper back.  Pneumothorax refers to free air around the  collapsed lung(s), inside of the thoracic cavity (chest cavity).  Another two possible  complications related to a similar event would include: Hemothorax and Chylothorax.   These are variations of the Pneumothorax, where instead of air around the collapsed  lung(s), you may have blood or chyle, respectively. °      6. Spinal headaches: They may occur with any procedures in the area of the spine. °      7. Persistent CSF (Cerebro-Spinal Fluid) leakage: This is a rare problem, but may occur  with prolonged intrathecal or epidural catheters either due to the formation of a fistulous  track or a dural tear. °      8. Nerve damage: By working so close to the spinal cord, there is always a possibility of  nerve damage, which could be as serious as a permanent spinal cord injury with  paralysis. °      9. Death:  Although rare, severe deadly allergic reactions known as "Anaphylactic  reaction" can occur to any of the medications used. °     10. Worsening of the symptoms:  We can always make thing worse. ° °What are the chances   of something like this happening? °Chances of any of this occuring are extremely low.  By statistics, you have more of a chance of getting killed in a motor vehicle accident: while driving to the hospital than any of the above occurring .  Nevertheless, you should be aware that they are possibilities.  In general, it is similar to taking a shower.  Everybody knows that you can slip, hit your head and get killed.  Does that mean that you should not shower again?  Nevertheless always keep in mind that statistics do not mean anything if you happen to be on the wrong side of them.  Even if a procedure has a 1 (one) in a  1,000,000 (million) chance of going wrong, it you happen to be that one..Also, keep in mind that by statistics, you have more of a chance of having something go wrong when taking medications. ° °Who should not have this procedure? °If you are on a blood thinning medication (e.g. Coumadin, Plavix, see list of "Blood Thinners"), or if you have an active infection going on, you should not have the procedure.  If you are taking any blood thinners, please inform your physician. ° °How should I prepare for this procedure? °Do not eat or drink anything at least six hours prior to the procedure. °Bring a driver with you .  It cannot be a taxi. °Come accompanied by an adult that can drive you back, and that is strong enough to help you if your legs get weak or numb from the local anesthetic. °Take all of your medicines the morning of the procedure with just enough water to swallow them. °If you have diabetes, make sure that you are scheduled to have your procedure done first thing in the morning, whenever possible. °If you have diabetes, take only half of your insulin dose and notify our nurse that you have done so as soon as you arrive at the clinic. °If you are diabetic, but only take blood sugar pills (oral hypoglycemic), then do not take them on the morning of your procedure.  You may take them after you have had the procedure. °Do not take aspirin or any aspirin-containing medications, at least eleven (11) days prior to the procedure.  They may prolong bleeding. °Wear loose fitting clothing that may be easy to take off and that you would not mind if it got stained with Betadine or blood. °Do not wear any jewelry or perfume °Remove any nail coloring.  It will interfere with some of our monitoring equipment. ° °NOTE: Remember that this is not meant to be interpreted as a complete list of all possible complications.  Unforeseen problems may occur. ° °BLOOD THINNERS °The following drugs contain aspirin or other products,  which can cause increased bleeding during surgery and should not be taken for 2 weeks prior to and 1 week after surgery.  If you should need take something for relief of minor pain, you may take acetaminophen which is found in Tylenol,m Datril, Anacin-3 and Panadol. It is not blood thinner. The products listed below are.  Do not take any of the products listed below in addition to any listed on your instruction sheet. ° °A.P.C or A.P.C with Codeine Codeine Phosphate Capsules #3 Ibuprofen Ridaura  °ABC compound Congesprin Imuran rimadil  °Advil Cope Indocin Robaxisal  °Alka-Seltzer Effervescent Pain Reliever and Antacid Coricidin or Coricidin-D ° Indomethacin Rufen  °Alka-Seltzer plus Cold Medicine Cosprin Ketoprofen S-A-C Tablets  °Anacin Analgesic Tablets or Capsules Coumadin   Korlgesic Salflex  Anacin Extra Strength Analgesic tablets or capsules CP-2 Tablets Lanoril Salicylate  Anaprox Cuprimine Capsules Levenox Salocol  Anexsia-D Dalteparin Magan Salsalate  Anodynos Darvon compound Magnesium Salicylate Sine-off  Ansaid Dasin Capsules Magsal Sodium Salicylate  Anturane Depen Capsules Marnal Soma  APF Arthritis pain formula Dewitt's Pills Measurin Stanback  Argesic Dia-Gesic Meclofenamic Sulfinpyrazone  Arthritis Bayer Timed Release Aspirin Diclofenac Meclomen Sulindac  Arthritis pain formula Anacin Dicumarol Medipren Supac  Analgesic (Safety coated) Arthralgen Diffunasal Mefanamic Suprofen  Arthritis Strength Bufferin Dihydrocodeine Mepro Compound Suprol  Arthropan liquid Dopirydamole Methcarbomol with Aspirin Synalgos  ASA tablets/Enseals Disalcid Micrainin Tagament  Ascriptin Doan's Midol Talwin  Ascriptin A/D Dolene Mobidin Tanderil  Ascriptin Extra Strength Dolobid Moblgesic Ticlid  Ascriptin with Codeine Doloprin or Doloprin with Codeine Momentum Tolectin  Asperbuf Duoprin Mono-gesic Trendar  Aspergum Duradyne Motrin or Motrin IB Triminicin  Aspirin plain, buffered or enteric coated  Durasal Myochrisine Trigesic  Aspirin Suppositories Easprin Nalfon Trillsate  Aspirin with Codeine Ecotrin Regular or Extra Strength Naprosyn Uracel  Atromid-S Efficin Naproxen Ursinus  Auranofin Capsules Elmiron Neocylate Vanquish  Axotal Emagrin Norgesic Verin  Azathioprine Empirin or Empirin with Codeine Normiflo Vitamin E  Azolid Emprazil Nuprin Voltaren  Bayer Aspirin plain, buffered or children's or timed BC Tablets or powders Encaprin Orgaran Warfarin Sodium  Buff-a-Comp Enoxaparin Orudis Zorpin  Buff-a-Comp with Codeine Equegesic Os-Cal-Gesic   Buffaprin Excedrin plain, buffered or Extra Strength Oxalid   Bufferin Arthritis Strength Feldene Oxphenbutazone   Bufferin plain or Extra Strength Feldene Capsules Oxycodone with Aspirin   Bufferin with Codeine Fenoprofen Fenoprofen Pabalate or Pabalate-SF   Buffets II Flogesic Panagesic   Buffinol plain or Extra Strength Florinal or Florinal with Codeine Panwarfarin   Buf-Tabs Flurbiprofen Penicillamine   Butalbital Compound Four-way cold tablets Penicillin   Butazolidin Fragmin Pepto-Bismol   Carbenicillin Geminisyn Percodan   Carna Arthritis Reliever Geopen Persantine   Carprofen Gold's salt Persistin   Chloramphenicol Goody's Phenylbutazone   Chloromycetin Haltrain Piroxlcam   Clmetidine heparin Plaquenil   Cllnoril Hyco-pap Ponstel   Clofibrate Hydroxy chloroquine Propoxyphen         Before stopping any of these medications, be sure to consult the physician who ordered them.  Some, such as Coumadin (Warfarin) are ordered to prevent or treat serious conditions such as "deep thrombosis", "pumonary embolisms", and other heart problems.  The amount of time that you may need off of the medication may also vary with the medication and the reason for which you were taking it.  If you are taking any of these medications, please make sure you notify your pain physician before you undergo any procedures.         Selective Nerve Root  Block Patient Information  Description: Specific nerve roots exit the spinal canal and these nerves can be compressed and inflamed by a bulging disc and bone spurs.  By injecting steroids on the nerve root, we can potentially decrease the inflammation surrounding these nerves, which often leads to decreased pain.  Also, by injecting local anesthesia on the nerve root, this can provide [place] helpful information to give to your referring doctor if it decreases your pain.  Selective nerve root blocks can be done along the spine from the neck to the low back depending on the location of your pain.   After numbing the skin with local anesthesia, a small needle is passed to the nerve root and the position of the needle is verified using x-ray pictures.  After the needle is  in correct position, we then deposit the medication.  You may experience a pressure sensation while this is being done.  The entire block usually lasts less than 15 minutes.  Conditions that may be treated with selective nerve root blocks: Low back and leg pain Spinal stenosis Diagnostic block prior to potential surgery Neck and arm pain Post laminectomy syndrome  Preparation for the injection:  Do not eat any solid food or dairy products within 8 hours of your appointment. You may drink clear liquids up to 3 hours before an appointment.  Clear liquids include water, black coffee, juice or soda.  No milk or cream please. You may take your regular medications, including pain medications, with a sip of water before your appointment.  Diabetics should hold regular insulin (if taken separately) and take 1/2 normal NPH dose the morning of the procedure.  Carry some sugar containing items with you to your appointment. A driver must accompany you and be prepared to drive you home after your procedure. Bring all your current medications with you. An IV may be inserted and sedation may be given at the discretion of the physician. A blood  pressure cuff, EKG, and other monitors will often be applied during the procedure.  Some patients may need to have extra oxygen administered for a short period. You will be asked to provide medical information, including allergies, prior to the procedure.  We must know immediately if you are taking blood  Thinners (like Coumadin) or if you are allergic to IV iodine contrast (dye).  Possible side-effects: All are usually temporary Bleeding from needle site Light headedness Numbness and tingling Decreased blood pressure Weakness in arms/legs Pressure sensation in back/neck Pain at injection site (several days)  Possible complications: All are extremely rare Infection Nerve injury Spinal headache (a headache wore with upright position)  Call if you experience: Fever/chills associated with headache or increased back/neck pain Headache worsened by an upright position New onset weakness or numbness of an extremity below the injection site Hives or difficulty breathing (go to the emergency room) Inflammation or drainage at the injection site(s) Severe back/neck pain greater than usual New symptoms which are concerning to you  Please note:  Although the local anesthetic injected can often make your back or neck feel good for several hours after the injection the pain will likely return.  It takes 3-5 days for steroids to work on the nerve root. You may not notice any pain relief for at least one week.  If effective, we will often do a series of 3 injections spaced 3-6 weeks apart to maximally decrease your pain.    If you have any questions, please call [phone] [facility]

## 2022-09-17 NOTE — Addendum Note (Signed)
Addended by: Janann Colonel on: 09/17/2022 01:17 PM   Modules accepted: Orders

## 2022-09-19 ENCOUNTER — Ambulatory Visit: Payer: 59 | Admitting: Student in an Organized Health Care Education/Training Program

## 2022-10-08 ENCOUNTER — Ambulatory Visit
Payer: 59 | Attending: Student in an Organized Health Care Education/Training Program | Admitting: Student in an Organized Health Care Education/Training Program

## 2022-10-08 ENCOUNTER — Ambulatory Visit
Admission: RE | Admit: 2022-10-08 | Discharge: 2022-10-08 | Disposition: A | Discharge status: Home / Self Care | Payer: 59 | Source: Ambulatory Visit | Attending: Student in an Organized Health Care Education/Training Program | Admitting: Student in an Organized Health Care Education/Training Program

## 2022-10-08 VITALS — BP 153/95 | HR 77 | Temp 97.9°F | Resp 17 | Ht 59.0 in | Wt 170.0 lb

## 2022-10-08 DIAGNOSIS — G8929 Other chronic pain: Secondary | ICD-10-CM | POA: Diagnosis not present

## 2022-10-08 DIAGNOSIS — M7591 Shoulder lesion, unspecified, right shoulder: Secondary | ICD-10-CM | POA: Insufficient documentation

## 2022-10-08 DIAGNOSIS — M7581 Other shoulder lesions, right shoulder: Secondary | ICD-10-CM | POA: Insufficient documentation

## 2022-10-08 DIAGNOSIS — M25511 Pain in right shoulder: Secondary | ICD-10-CM | POA: Insufficient documentation

## 2022-10-08 MED ORDER — IOHEXOL 180 MG/ML  SOLN
10.0000 mL | Freq: Once | INTRAMUSCULAR | Status: AC
  Administered 2022-10-08: 10 mL via INTRA_ARTICULAR
  Filled 2022-10-08: qty 20

## 2022-10-08 MED ORDER — LIDOCAINE HCL 2 % IJ SOLN
20.0000 mL | Freq: Once | INTRAMUSCULAR | Status: AC
  Administered 2022-10-08: 100 mg

## 2022-10-08 MED ORDER — ROPIVACAINE HCL 2 MG/ML IJ SOLN
4.0000 mL | Freq: Once | INTRAMUSCULAR | Status: AC
  Administered 2022-10-08: 4 mL via INTRA_ARTICULAR
  Filled 2022-10-08: qty 20

## 2022-10-08 MED ORDER — DEXAMETHASONE SODIUM PHOSPHATE 10 MG/ML IJ SOLN
10.0000 mg | Freq: Once | INTRAMUSCULAR | Status: AC
  Administered 2022-10-08: 10 mg
  Filled 2022-10-08: qty 1

## 2022-10-08 NOTE — Patient Instructions (Signed)

## 2022-10-08 NOTE — Progress Notes (Signed)
PROVIDER NOTE: Interpretation of information contained herein should be left to medically-trained personnel. Specific patient instructions are provided elsewhere under "Patient Instructions" section of medical record. This document was created in part using STT-dictation technology, any transcriptional errors that may result from this process are unintentional.  Patient: Shelby Henson Type: Established DOB: 1970/03/09 MRN: [id number] PCP: Alba Cory, MD  Service: Procedure DOS: 10/08/2022 Setting: Ambulatory Location: Ambulatory outpatient facility Delivery: Face-to-face Provider: Edward Jolly, MD Specialty: Interventional Pain Management Specialty designation: 09 Location: Outpatient facility Ref. Prov.: Alba Cory, MD       Interventional Therapy   Procedure: Suprascapular nerve block (SSNB) #1  Laterality:  Right  Level: Superior to scapular spine, lateral to supraspinatus fossa (Suprascapular notch).  Imaging: Fluoroscopic guidance         Anesthesia: Local anesthesia (1-2% Lidocaine)  DOS: 10/08/2022  Performed by: Edward Jolly, MD  Purpose: Diagnostic/Therapeutic Indications: Shoulder pain, severe enough to impact quality of life and/or function. 1. Supraspinatus tendonitis, right   2. Right rotator cuff tendonitis   3. Chronic right shoulder pain    NAS-11 score:   Pre-procedure: 4 /10   Post-procedure: 3 /10     Target: Suprascapular nerve Location: midway between the medial border of the scapula and the acromion as it runs through the suprascapular notch. Region: Suprascapular, posterior shoulder  Approach: Percutaneous  Neuroanatomy: The suprascapular nerve is the lateral branch of the superior trunk of the brachial plexus. It receives nerve fibers that originate in the nerve roots C5 and C6 (and sometimes C4). It is a mixed nerve, meaning that it provides both sensory and motor supply for the suprascapular region. Function: The main function of this nerve is  to provide motor innervation for two muscles, the supraspinatus and infraspinatus muscles. They are part of the rotator cuff muscles. In addition, the suprascapular nerve provides a sensory supply to the joints of the scapula (glenohumeral and acromioclavicular joints). Rationale (medical necessity): procedure needed and proper for the diagnosis and/or treatment of the patient's medical symptoms and needs.  Position / Prep / Materials:  Position: Prone Materials:  Tray: Block Needle(s):  Type: Spinal  Gauge (G): 22  Length: 3.5 in.  Qty: 1 Prep solution: DuraPrep (Iodine Povacrylex [0.7% available iodine] and Isopropyl Alcohol, 74% w/w) Prep Area: Entire posterior shoulder area. From upper spine to shoulder proper (upper arm), and from lateral neck to lower tip of shoulder blade.   H&P (Pre-op Assessment):  Shelby Henson is a 52 y.o. (year old), female patient, seen today for interventional treatment. She  has a past surgical history that includes Supracervical abdominal hysterectomy (2010); Cesarean section; Cesarean section; Ablation; LEEP (2005); laparoscopy (2018); Breast cyst aspiration (Left); Cesarean section; Total hip arthroplasty (Right, 11/12/2018); Hip surgery (10/2018); and Colonoscopy (N/A, 09/22/2020). Ms. Lathen has a current medication list which includes the following prescription(s): albuterol, airsupra, desvenlafaxine, diclofenac sodium, famotidine, trelegy ellipta, gabapentin, emgality, lidocaine, methocarbamol, montelukast, pantoprazole, and prazosin. Her primarily concern today is the Shoulder Pain  Initial Vital Signs:  Pulse/HCG Rate: 77ECG Heart Rate: 70 Temp: 97.9 F (36.6 C) Resp: 16 BP: 131/79 SpO2: 99 %  BMI: Estimated body mass index is 34.34 kg/m as calculated from the following:   Height as of this encounter: 4\' 11"  (1.499 m).   Weight as of this encounter: 170 lb (77.1 kg).  Risk Assessment: Allergies: Reviewed. She is allergic to celecoxib, prednisone,  topamax [topiramate], zolpidem, codeine, and penicillins.  Allergy Precautions: None required Coagulopathies: Reviewed. None identified.  Blood-thinner  therapy: None at this time Active Infection(s): Reviewed. None identified. Ms. Escovedo is afebrile  Site Confirmation: Ms. Alers was asked to confirm the procedure and laterality before marking the site Procedure checklist: Completed Consent: Before the procedure and under the influence of no sedative(s), amnesic(s), or anxiolytics, the patient was informed of the treatment options, risks and possible complications. To fulfill our ethical and legal obligations, as recommended by the American Medical Association's Code of Ethics, I have informed the patient of my clinical impression; the nature and purpose of the treatment or procedure; the risks, benefits, and possible complications of the intervention; the alternatives, including doing nothing; the risk(s) and benefit(s) of the alternative treatment(s) or procedure(s); and the risk(s) and benefit(s) of doing nothing. The patient was provided information about the general risks and possible complications associated with the procedure. These may include, but are not limited to: failure to achieve desired goals, infection, bleeding, organ or nerve damage, allergic reactions, paralysis, and death. In addition, the patient was informed of those risks and complications associated to the procedure, such as failure to decrease pain; infection; bleeding; organ or nerve damage with subsequent damage to sensory, motor, and/or autonomic systems, resulting in permanent pain, numbness, and/or weakness of one or several areas of the body; allergic reactions; (i.e.: anaphylactic reaction); and/or death. Furthermore, the patient was informed of those risks and complications associated with the medications. These include, but are not limited to: allergic reactions (i.e.: anaphylactic or anaphylactoid reaction(s)); adrenal  axis suppression; blood sugar elevation that in diabetics may result in ketoacidosis or comma; water retention that in patients with history of congestive heart failure may result in shortness of breath, pulmonary edema, and decompensation with resultant heart failure; weight gain; swelling or edema; medication-induced neural toxicity; particulate matter embolism and blood vessel occlusion with resultant organ, and/or nervous system infarction; and/or aseptic necrosis of one or more joints. Finally, the patient was informed that Medicine is not an exact science; therefore, there is also the possibility of unforeseen or unpredictable risks and/or possible complications that may result in a catastrophic outcome. The patient indicated having understood very clearly. We have given the patient no guarantees and we have made no promises. Enough time was given to the patient to ask questions, all of which were answered to the patient's satisfaction. Ms. Hark has indicated that she wanted to continue with the procedure. Attestation: I, the ordering provider, attest that I have discussed with the patient the benefits, risks, side-effects, alternatives, likelihood of achieving goals, and potential problems during recovery for the procedure that I have provided informed consent. Date  Time: 10/08/2022 10:33 AM  Pre-Procedure Preparation:  Monitoring: As per clinic protocol. Respiration, ETCO2, SpO2, BP, heart rate and rhythm monitor placed and checked for adequate function Safety Precautions: Patient was assessed for positional comfort and pressure points before starting the procedure. Time-out: I initiated and conducted the "Time-out" before starting the procedure, as per protocol. The patient was asked to participate by confirming the accuracy of the "Time Out" information. Verification of the correct person, site, and procedure were performed and confirmed by me, the nursing staff, and the patient. "Time-out"  conducted as per Joint Commission's Universal Protocol (UP.01.01.01). Time: 1134 Start Time: 1134 hrs.  Description of Procedure:          Procedural Technique Safety Precautions: Aspiration looking for blood return was conducted prior to all injections. At no point did we inject any substances, as a needle was being advanced. No attempts were made  at seeking any paresthesias. Safe injection practices and needle disposal techniques used. Medications properly checked for expiration dates. SDV (single dose vial) medications used. Description of the Procedure: Protocol guidelines were followed. The patient was placed in position over the procedure table. The target area was identified and the area prepped in the usual manner. Skin & deeper tissues infiltrated with local anesthetic. Appropriate amount of time allowed to pass for local anesthetics to take effect. The procedure needles were then advanced to the target area. Proper needle placement secured. Negative aspiration confirmed. Solution injected in intermittent fashion, asking for systemic symptoms every 0.5cc of injectate. The needles were then removed and the area cleansed, making sure to leave some of the prepping solution back to take advantage of its long term bactericidal properties.  5 cc solution made of 4 cc of 0.2% ropivacaine, 1 cc of Decadron 10 mg/cc, injected along the right suprascapular nerve.   Vitals:   10/08/22 1039 10/08/22 1134 10/08/22 1137  BP: 131/79 (!) 144/97 (!) 153/95  Pulse: 77    Resp: 16 18 17   Temp: 97.9 F (36.6 C)    SpO2: 99% 99% 100%  Weight: 170 lb (77.1 kg)    Height: 4\' 11"  (1.499 m)       Start Time: 1134 hrs. End Time: 1137 hrs.  Imaging Guidance (Spinal):          Type of Imaging Technique: Fluoroscopy Guidance (Spinal) Indication(s): Assistance in needle guidance and placement for procedures requiring needle placement in or near specific anatomical locations not easily accessible without such  assistance. Exposure Time: Please see nurses notes. Contrast: Before injecting any contrast, we confirmed that the patient did not have an allergy to iodine, shellfish, or radiological contrast. Once satisfactory needle placement was completed at the desired level, radiological contrast was injected. Contrast injected under live fluoroscopy. No contrast complications. See chart for type and volume of contrast used. Fluoroscopic Guidance: I was personally present during the use of fluoroscopy. "Tunnel Vision Technique" used to obtain the best possible view of the target area. Parallax error corrected before commencing the procedure. "Direction-depth-direction" technique used to introduce the needle under continuous pulsed fluoroscopy. Once target was reached, antero-posterior, oblique, and lateral fluoroscopic projection used confirm needle placement in all planes. Images permanently stored in EMR. Interpretation: I personally interpreted the imaging intraoperatively. Adequate needle placement confirmed in multiple planes. Appropriate spread of contrast into desired area was observed. No evidence of afferent or efferent intravascular uptake. No intrathecal or subarachnoid spread observed. Permanent images saved into the patient's record.  Antibiotic Prophylaxis:   Anti-infectives (From admission, onward)    None      Indication(s): None identified  Post-operative Assessment:  Post-procedure Vital Signs:  Pulse/HCG Rate: 7765 Temp: 97.9 F (36.6 C) Resp: 17 BP: (!) 153/95 SpO2: 100 %  EBL: None  Complications: No immediate post-treatment complications observed by team, or reported by patient.  Note: The patient tolerated the entire procedure well. A repeat set of vitals were taken after the procedure and the patient was kept under observation following institutional policy, for this type of procedure. Post-procedural neurological assessment was performed, showing return to baseline, prior  to discharge. The patient was provided with post-procedure discharge instructions, including a section on how to identify potential problems. Should any problems arise concerning this procedure, the patient was given instructions to immediately contact us, at any time, without hesitation. In any case, we plan to contact the patient by telephone for a follow-up status report  regarding this interventional procedure.  Comments:  No additional relevant information.  Plan of Care (POC)  Orders:  Orders Placed This Encounter  Procedures   DG PAIN CLINIC C-ARM 1-60 MIN NO REPORT    Intraoperative interpretation by procedural physician at [facility].    Standing Status:   Standing    Number of Occurrences:   1    Order Specific Question:   Reason for exam:    Answer:   Assistance in needle guidance and placement for procedures requiring needle placement in or near specific anatomical locations not easily accessible without such assistance.     Medications ordered for procedure: Meds ordered this encounter  Medications   iohexol (OMNIPAQUE) 180 MG/ML injection 10 mL    Must be Myelogram-compatible. If not available, you may substitute with a water-soluble, non-ionic, hypoallergenic, myelogram-compatible radiological contrast medium.   lidocaine (XYLOCAINE) 2 % (with pres) injection 400 mg   dexamethasone (DECADRON) injection 10 mg   ropivacaine (PF) 2 mg/mL (0.2%) (NAROPIN) injection 4 mL   Medications administered: We administered iohexol, lidocaine, dexamethasone, and ropivacaine (PF) 2 mg/mL (0.2%).  See the medical record for exact dosing, route, and time of administration.  Follow-up plan:   Return in about 4 weeks (around 11/05/2022) for PPE, F2F.       Recent Visits Date Type Provider Dept  09/11/22 Office Visit Edward Jolly, MD [facility]  Showing recent visits within past 90 days and meeting all other requirements Today's Visits Date Type Provider Dept   10/08/22 Procedure visit Edward Jolly, MD [facility]  Showing today's visits and meeting all other requirements Future Appointments Date Type Provider Dept  11/05/22 Appointment Edward Jolly, MD [facility]  Showing future appointments within next 90 days and meeting all other requirements  Disposition: Discharge home  Discharge (Date  Time): 10/08/2022; 1145 hrs.   Primary Care Physician: Alba Cory, MD Location: [facility] Note by: Edward Jolly, MD (TTS technology used. I apologize for any typographical errors that were not detected and corrected.) Date: 10/08/2022; Time: 11:52 AM  Disclaimer:  Medicine is not an Visual merchandiser. The only guarantee in medicine is that nothing is guaranteed. It is important to note that the decision to proceed with this intervention was based on the information collected from the patient. The Data and conclusions were drawn from the patient's questionnaire, the interview, and the physical examination. Because the information was provided in large part by the patient, it cannot be guaranteed that it has not been purposely or unconsciously manipulated. Every effort has been made to obtain as much relevant data as possible for this evaluation. It is important to note that the conclusions that lead to this procedure are derived in large part from the available data. Always take into account that the treatment will also be dependent on availability of resources and existing treatment guidelines, considered by other Pain Management Practitioners as being common knowledge and practice, at the time of the intervention. For Medico-Legal purposes, it is also important to point out that variation in procedural techniques and pharmacological choices are the acceptable norm. The indications, contraindications, technique, and results of the above procedure should only be interpreted and judged by a Board-Certified  Interventional Pain Specialist with extensive familiarity and expertise in the same exact procedure and technique.

## 2022-10-08 NOTE — Progress Notes (Signed)
Safety precautions to be maintained throughout the outpatient stay will include: orient to surroundings, keep bed in low position, maintain call bell within reach at all times, provide assistance with transfer out of bed and ambulation.  

## 2022-10-09 ENCOUNTER — Other Ambulatory Visit: Payer: Self-pay

## 2022-10-09 ENCOUNTER — Other Ambulatory Visit: Payer: Self-pay | Admitting: Family Medicine

## 2022-10-09 ENCOUNTER — Encounter: Payer: Self-pay | Admitting: Student in an Organized Health Care Education/Training Program

## 2022-10-09 ENCOUNTER — Telehealth: Payer: Self-pay | Admitting: *Deleted

## 2022-10-09 DIAGNOSIS — M25531 Pain in right wrist: Secondary | ICD-10-CM

## 2022-10-09 MED ORDER — DICLOFENAC SODIUM 1 % EX GEL
2.0000 g | Freq: Four times a day (QID) | CUTANEOUS | 1 refills | Status: DC
  Filled 2022-10-09 – 2022-10-29 (×2): qty 100, 12d supply, fill #0
  Filled 2022-11-12 – 2023-03-04 (×3): qty 100, 12d supply, fill #1

## 2022-10-09 NOTE — Telephone Encounter (Signed)
Attempted to call for post procedure follow-up. Voice mailbox not set up. 

## 2022-10-10 ENCOUNTER — Other Ambulatory Visit: Payer: Self-pay | Admitting: Family Medicine

## 2022-10-10 DIAGNOSIS — Z1231 Encounter for screening mammogram for malignant neoplasm of breast: Secondary | ICD-10-CM

## 2022-10-16 ENCOUNTER — Ambulatory Visit: Payer: 59

## 2022-10-17 ENCOUNTER — Encounter: Payer: Self-pay | Admitting: Pulmonary Disease

## 2022-10-17 ENCOUNTER — Ambulatory Visit: Payer: 59 | Admitting: Pulmonary Disease

## 2022-10-17 VITALS — BP 130/70 | HR 88 | Temp 98.5°F | Ht 59.0 in | Wt 185.2 lb

## 2022-10-17 DIAGNOSIS — J4550 Severe persistent asthma, uncomplicated: Secondary | ICD-10-CM | POA: Diagnosis not present

## 2022-10-17 DIAGNOSIS — D721 Eosinophilia, unspecified: Secondary | ICD-10-CM | POA: Diagnosis not present

## 2022-10-17 NOTE — Progress Notes (Signed)
Synopsis: Referred in by Shelby Cory, MD   Subjective:   PATIENT ID: Shelby Henson GENDER: female DOB: 1970/12/20, MRN: [id number]  Chief Complaint  Patient presents with   Follow-up    Non prod cough. Canceled PFT.     HPI Ms Shelby Henson is a 52 y.o female patient with a past medical history of moderate persistent asthma and GERD presenting to the pulmonary clinic today for further management of her asthma.   She had asthma as a child which she grew out of and then symptoms restarted in her early 13s. She was on pulmicort nebulizer and albuterol as needed. She never required hospitalization and symptoms manifest as dry cough usually.   I saw her in August 2024 for complaints of 4 months of dry cough that persisted. She was trialed on [place] with minimal benefit. About a week prior to her presentation, she was started on Trelegy Ellipta 100 1 puff daily which seems to be helping. I increased her Trelegy Ellipta to 200 which seems to have helped with cough. She denies any rash, neuropathy, allergic rhinitis or cardiac symptoms.   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 No travel outside the [place]. She has 3 kids who are healthy.   ROS All systems were reviewed and are negative except for the above.  Objective:   Vitals:   10/17/22 0948  BP: 130/70  Pulse: 88  Temp: 98.5 F (36.9 C)  TempSrc: Oral  SpO2: 99%  Weight: 185 lb 3.2 oz (84 kg)  Height: 4\' 11"  (1.499 m)   99% on RA BMI Readings from Last 3 Encounters:  10/17/22 37.41 kg/m  10/08/22 34.34 kg/m  09/11/22 35.14 kg/m   Wt Readings from Last 3 Encounters:  10/17/22 185 lb 3.2 oz (84 kg)  10/08/22 170 lb (77.1 kg)  09/11/22 174 lb (78.9 kg)    Physical Exam GEN: NAD, Healthy Appearing HEENT: Supple Neck, Reactive Pupils, EOMI  CVS: Normal S1, Normal S2, RRR, No murmurs or ES appreciated  Lungs: Clear bilateral air entry.  Abdomen:  Soft, non tender, non distended, + BS  Extremities: Warm and well perfused, No edema  Skin: No suspicious lesions appreciated  Psych: Normal Affect  Ancillary Information   CBC    Component Value Date/Time   WBC 6.1 08/10/2022 1150   RBC 4.55 08/10/2022 1150   HGB 11.2 (L) 08/10/2022 1150   HGB 12.2 08/04/2018 1557   HCT 36.4 08/10/2022 1150   HCT 36.3 08/04/2018 1557   PLT 273 08/10/2022 1150   PLT 208 08/04/2018 1557   MCV 80.0 08/10/2022 1150   MCV 82 08/04/2018 1557   MCV 86 08/07/2011 2024   MCH 24.6 (L) 08/10/2022 1150   MCHC 30.8 (L) 08/10/2022 1150   RDW 17.5 (H) 08/10/2022 1150   RDW 13.6 08/04/2018 1557   RDW 13.8 08/07/2011 2024   LYMPHSABS 2,019 08/10/2022 1150   LYMPHSABS 2.1 05/20/2015 0824   LYMPHSABS 2.6 08/07/2011 2024   MONOABS 0.5 12/08/2019 1633   MONOABS 0.6 08/07/2011 2024   EOSABS 897 (H) 08/10/2022 1150   EOSABS 1.1 (H) 05/20/2015 0824   EOSABS 1.0 (H) 08/07/2011 2024   BASOSABS 49 08/10/2022 1150   BASOSABS 0.0 05/20/2015 0824   BASOSABS 0.1 08/07/2011 2024        No data to display         Chest x-ray 08/06 no acute intrathoracic process.  Using over-the-counter in July 2024 897 Allergen panel was negative.  IgE 4.  Creatinine 0.7 and BUN 15. Strongyloides serology was negative.  Assessment & Plan:  Ms Shelby Henson is a 52 y.o female patient with a past medical history of moderate persistent asthma and GERD presenting to the pulmonary clinic today for further management of her asthma.   #Moderate Persistent Asthma  Manifests as dry cough. Elevated EOS at 1100 with EGPA in the differential. No other clear organ involvement.   []  PFT  []  CBC with differential, ANCA, UA to assess for proteinuria. []  Continue with Fluticasone-Umeclidinum-Vilanterol [Trelegy Ellipta] to 279mcg-62.36mcg-25mcg 1 puff daily.  []  Can discontinue montelukast [Singulair] 10mg  1tab daily as she says she did not notice any difference. []  Continue with  budesonide-albuterol [Airsupra] 80-92 puffs every 6 hours as needed. []  Will discuss biologic options on follow-up visit.  #GERD Currently on famotidine 40mg  1tab daily.   []  Start Pantoprazole 40mg  1 tab PO daily  []  Decrease Famotidine to 20mg  1tab daily  []  Advise on lifestyle modifications.   Return in about 3 months (around 01/16/2023).  I spent 45 minutes caring for this patient today, including preparing to see the patient, obtaining a medical history , reviewing a separately obtained history, performing a medically appropriate examination and/or evaluation, counseling and educating the patient/family/caregiver, ordering medications, tests, or procedures, documenting clinical information in the electronic health record, and independently interpreting results (not separately reported/billed) and communicating results to the patient/family/caregiver  Shelby Colonel, MD [facility] 10/17/2022 10:14 AM

## 2022-10-19 ENCOUNTER — Ambulatory Visit
Admission: RE | Admit: 2022-10-19 | Discharge: 2022-10-19 | Disposition: A | Discharge status: Home / Self Care | Payer: 59 | Source: Ambulatory Visit | Attending: Family Medicine | Admitting: Family Medicine

## 2022-10-19 ENCOUNTER — Other Ambulatory Visit: Payer: Self-pay

## 2022-10-19 DIAGNOSIS — Z1231 Encounter for screening mammogram for malignant neoplasm of breast: Secondary | ICD-10-CM | POA: Insufficient documentation

## 2022-10-29 ENCOUNTER — Other Ambulatory Visit: Payer: Self-pay

## 2022-11-05 ENCOUNTER — Ambulatory Visit
Payer: 59 | Attending: Student in an Organized Health Care Education/Training Program | Admitting: Student in an Organized Health Care Education/Training Program

## 2022-11-05 ENCOUNTER — Encounter: Payer: Self-pay | Admitting: Student in an Organized Health Care Education/Training Program

## 2022-11-05 VITALS — BP 142/76 | HR 73 | Temp 97.5°F | Resp 16 | Ht 59.0 in | Wt 174.0 lb

## 2022-11-05 DIAGNOSIS — G8929 Other chronic pain: Secondary | ICD-10-CM | POA: Diagnosis not present

## 2022-11-05 DIAGNOSIS — M7581 Other shoulder lesions, right shoulder: Secondary | ICD-10-CM | POA: Diagnosis not present

## 2022-11-05 DIAGNOSIS — M25511 Pain in right shoulder: Secondary | ICD-10-CM | POA: Diagnosis not present

## 2022-11-05 DIAGNOSIS — M7591 Shoulder lesion, unspecified, right shoulder: Secondary | ICD-10-CM | POA: Insufficient documentation

## 2022-11-05 NOTE — Progress Notes (Signed)
PROVIDER NOTE: Information contained herein reflects review and annotations entered in association with encounter. Interpretation of such information and data should be left to medically-trained personnel. Information provided to patient can be located elsewhere in the medical record under "Patient Instructions". Document created using STT-dictation technology, any transcriptional errors that may result from process are unintentional.    Patient: Shelby Henson  Service Category: E/M  Provider: Edward Jolly, MD  DOB: 11-Nov-1970  DOS: 11/05/2022  Referring Provider: Alba Cory, MD  MRN: [id number]  Specialty: Interventional Pain Management  PCP: Alba Cory, MD  Type: Established Patient  Setting: Ambulatory outpatient    Location: Office  Delivery: Face-to-face     HPI  Shelby Henson, a 52 y.o. year old female, is here today because of her Supraspinatus tendonitis, right [M75.91]. Shelby Henson primary complain today is Shoulder Pain (right)  Pertinent problems: Shelby Henson does not have any pertinent problems on file. Pain Assessment: Severity of Chronic pain is reported as a 5 /10. Location: Shoulder Right/down arm to elbow. Onset: More than a month ago. Quality: Aching, Constant, Sore, Nagging, Stabbing, Tender. Timing:  . Modifying factor(s): Nothing is working , uses, ice, heat. Vitals:  height is 4\' 11"  (1.499 m) and weight is 174 lb (78.9 kg). Her temperature is 97.5 F (36.4 C) (abnormal). Her blood pressure is 142/76 (abnormal) and her pulse is 73. Her respiration is 16 and oxygen saturation is 100%.  BMI: Estimated body mass index is 35.14 kg/m as calculated from the following:   Height as of this encounter: 4\' 11"  (1.499 m).   Weight as of this encounter: 174 lb (78.9 kg). Last encounter: 09/11/2022. Last procedure: 10/08/2022.  Reason for encounter: post-procedure evaluation and assessment   Post-procedure evaluation   Procedure: Suprascapular nerve block (SSNB) #1   Laterality:  Right  Level: Superior to scapular spine, lateral to supraspinatus fossa (Suprascapular notch).  Imaging: Fluoroscopic guidance         Anesthesia: Local anesthesia (1-2% Lidocaine)  DOS: 10/08/2022  Performed by: Edward Jolly, MD  Purpose: Diagnostic/Therapeutic Indications: Shoulder pain, severe enough to impact quality of life and/or function. 1. Supraspinatus tendonitis, right   2. Right rotator cuff tendonitis   3. Chronic right shoulder pain    NAS-11 score:   Pre-procedure: 4 /10   Post-procedure: 3 /10      Effectiveness:  Initial hour after procedure: 50 %  Subsequent 4-6 hours post-procedure: 0 %  Analgesia past initial 6 hours: 0 % (no relief)  Ongoing improvement:  Analgesic:  0%  ROS  Constitutional: Denies any fever or chills Gastrointestinal: No reported hemesis, hematochezia, vomiting, or acute GI distress Musculoskeletal:  right shoulder pain Neurological: No reported episodes of acute onset apraxia, aphasia, dysarthria, agnosia, amnesia, paralysis, loss of coordination, or loss of consciousness  Medication Review  Albuterol-Budesonide, Fluticasone-Umeclidin-Vilant, Galcanezumab-gnlm, albuterol, desvenlafaxine, diclofenac Sodium, famotidine, gabapentin, lidocaine, methocarbamol, montelukast, pantoprazole, and prazosin  History Review  Allergy: Shelby Henson is allergic to celecoxib, prednisone, topamax [topiramate], zolpidem, codeine, and penicillins. Drug: Shelby Henson  reports no history of drug use. Alcohol:  reports current alcohol use. Tobacco:  reports that she has never smoked. She has never used smokeless tobacco. Social: Shelby Henson  reports that she has never smoked. She has never used smokeless tobacco. She reports current alcohol use. She reports that she does not use drugs. Medical:  has a past medical history of Anemia, Asthma, Complication of anesthesia, Depression, GERD (gastroesophageal reflux disease), Migraine, Morbid obesity  (HCC),  Osteoarthritis, and PONV (postoperative nausea and vomiting). Surgical: Shelby Henson  has a past surgical history that includes Supracervical abdominal hysterectomy (2010); Cesarean section; Cesarean section; Ablation; LEEP (2005); laparoscopy (2018); Breast cyst aspiration (Left); Cesarean section; Total hip arthroplasty (Right, 11/12/2018); Hip surgery (10/2018); and Colonoscopy (N/A, 09/22/2020). Family: family history includes Bipolar disorder in her brother; Colon cancer in her maternal grandfather; Depression in her mother.  Laboratory Chemistry Profile   Renal Lab Results  Component Value Date   BUN 15 05/11/2022   CREATININE 0.70 05/11/2022   BCR SEE NOTE: 05/11/2022   GFRAA 122 09/08/2019   GFRNONAA >60 12/08/2019    Hepatic Lab Results  Component Value Date   AST 26 05/11/2022   ALT 25 05/11/2022   ALBUMIN 4.1 09/17/2018   ALKPHOS 94 09/17/2018   HCVAB 0.1 09/17/2018    Electrolytes Lab Results  Component Value Date   NA 141 05/11/2022   K 4.2 05/11/2022   CL 104 05/11/2022   CALCIUM 9.9 05/11/2022    Bone Lab Results  Component Value Date   VD25OH 23 (L) 05/11/2022    Inflammation (CRP: Acute Phase) (ESR: Chronic Phase) No results found for: "CRP", "ESRSEDRATE", "LATICACIDVEN"       Note: Above Lab results reviewed.  Recent Imaging Review  MM 3D SCREENING MAMMOGRAM BILATERAL BREAST CLINICAL DATA:  Screening.  EXAM: DIGITAL SCREENING BILATERAL MAMMOGRAM WITH TOMOSYNTHESIS AND CAD  TECHNIQUE: Bilateral screening digital craniocaudal and mediolateral oblique mammograms were obtained. Bilateral screening digital breast tomosynthesis was performed. The images were evaluated with computer-aided detection.  COMPARISON:  Previous exam(s).  ACR Breast Density Category c: The breasts are heterogeneously dense, which may obscure small masses.  FINDINGS: There are no findings suspicious for malignancy.  IMPRESSION: No mammographic evidence of  malignancy. A result letter of this screening mammogram will be mailed directly to the patient.  RECOMMENDATION: Screening mammogram in one year. (Code:SM-B-01Y)  BI-RADS CATEGORY  1: Negative.  Electronically Signed   By: Frederico Hamman M.D.   On: 10/22/2022 11:44 Note: Reviewed        Physical Exam  General appearance: Well nourished, well developed, and well hydrated. In no apparent acute distress Mental status: Alert, oriented x 3 (person, place, & time)       Respiratory: No evidence of acute respiratory distress Eyes: PERLA Vitals: BP (!) 142/76   Pulse 73   Temp (!) 97.5 F (36.4 C)   Resp 16   Ht 4\' 11"  (1.499 m)   Wt 174 lb (78.9 kg)   SpO2 100%   BMI 35.14 kg/m  BMI: Estimated body mass index is 35.14 kg/m as calculated from the following:   Height as of this encounter: 4\' 11"  (1.499 m).   Weight as of this encounter: 174 lb (78.9 kg). Ideal: Patient must be at least 60 in tall to calculate ideal body weight  Upper Extremity (UE) Exam      Side: Right upper extremity   Side: Left upper extremity  Skin & Extremity Inspection: Skin color, temperature, and hair growth are WNL. No peripheral edema or cyanosis. No masses, redness, swelling, asymmetry, or associated skin lesions. No contractures.   Skin & Extremity Inspection: Skin color, temperature, and hair growth are WNL. No peripheral edema or cyanosis. No masses, redness, swelling, asymmetry, or associated skin lesions. No contractures.  Functional ROM: Pain restricted ROM for shoulder   Functional ROM: Unrestricted ROM          Muscle Tone/Strength: Functionally intact. No obvious  neuro-muscular anomalies detected.   Muscle Tone/Strength: Functionally intact. No obvious neuro-muscular anomalies detected.  Sensory (Neurological): Arthropathic arthralgia           Sensory (Neurological): Unimpaired          Palpation: No palpable anomalies               Palpation: No palpable anomalies              Provocative  Test(s):  Phalen's test: deferred Tinel's test: deferred Apley's scratch test (touch opposite shoulder):  Action 1 (Across chest): Decreased ROM Action 2 (Overhead): Decreased ROM Action 3 (LB reach): Decreased ROM     Provocative Test(s):  Phalen's test: deferred Tinel's test: deferred Apley's scratch test (touch opposite shoulder):  Action 1 (Across chest): deferred Action 2 (Overhead): deferred Action 3 (LB reach): deferred       Assessment   Diagnosis Status  1. Supraspinatus tendonitis, right   2. Right rotator cuff tendonitis   3. Chronic right shoulder pain    Persistent Persistent Persistent   Updated Problems: No problems updated.  Plan of Care  Unfortunately, Hodan continues to struggle with persistent and severe right shoulder pain and limited range of motion.  She has rotator cuff arthropathy and supraspinatus tendinitis.  Given lack of response to conservative measures including physical therapy, NSAIDs, injections, I recommend that she follow-up with orthopedics to see what surgical options are available.  I will happy to see her back as needed. eOrders:  Orders Placed This Encounter  Procedures   AMB referral to orthopedics    Referral Priority:   Routine    Referral Type:   Consultation    Referred to Provider:   Huel Cote, MD    Number of Visits Requested:   1   Follow-up plan:   Return if symptoms worsen or fail to improve.      Recent Visits Date Type Provider Dept  10/08/22 Procedure visit Edward Jolly, MD [facility]  09/11/22 Office Visit Edward Jolly, MD [facility]  Showing recent visits within past 90 days and meeting all other requirements Today's Visits Date Type Provider Dept  11/05/22 Office Visit Edward Jolly, MD [facility]  Showing today's visits and meeting all other requirements Future Appointments No visits were found meeting these conditions. Showing future appointments within next 90  days and meeting all other requirements  I discussed the assessment and treatment plan with the patient. The patient was provided an opportunity to ask questions and all were answered. The patient agreed with the plan and demonstrated an understanding of the instructions.  Patient advised to call back or seek an in-person evaluation if the symptoms or condition worsens.  Duration of encounter: .  Total time on encounter, as per AMA guidelines included both the face-to-face and non-face-to-face time personally spent by the physician and/or other qualified health care professional(s) on the day of the encounter (includes time in activities that require the physician or other qualified health care professional and does not include time in activities normally performed by clinical staff). Physician's time may include the following activities when performed: Preparing to see the patient (e.g., pre-charting review of records, searching for previously ordered imaging, lab work, and nerve conduction tests) Review of prior analgesic pharmacotherapies. Reviewing PMP Interpreting ordered tests (e.g., lab work, imaging, nerve conduction tests) Performing post-procedure evaluations, including interpretation of diagnostic procedures Obtaining and/or reviewing separately obtained history Performing a medically appropriate examination and/or evaluation Counseling and educating  the patient/family/caregiver Ordering medications, tests, or procedures Referring and communicating with other health care professionals (when not separately reported) Documenting clinical information in the electronic or other health record Independently interpreting results (not separately reported) and communicating results to the patient/ family/caregiver Care coordination (not separately reported)  Note by: Edward Jolly, MD Date: 11/05/2022; Time: 3:04 PM

## 2022-11-07 ENCOUNTER — Other Ambulatory Visit: Payer: Self-pay

## 2022-11-07 ENCOUNTER — Encounter: Payer: Self-pay | Admitting: Family Medicine

## 2022-11-12 ENCOUNTER — Other Ambulatory Visit: Payer: Self-pay

## 2022-11-13 ENCOUNTER — Other Ambulatory Visit: Payer: Self-pay

## 2022-11-14 ENCOUNTER — Other Ambulatory Visit: Payer: Self-pay

## 2022-11-15 ENCOUNTER — Ambulatory Visit (HOSPITAL_BASED_OUTPATIENT_CLINIC_OR_DEPARTMENT_OTHER): Payer: Self-pay | Admitting: Orthopaedic Surgery

## 2022-11-15 ENCOUNTER — Ambulatory Visit: Payer: 59 | Admitting: Orthopaedic Surgery

## 2022-11-15 ENCOUNTER — Other Ambulatory Visit: Payer: Self-pay

## 2022-11-15 DIAGNOSIS — M75111 Incomplete rotator cuff tear or rupture of right shoulder, not specified as traumatic: Secondary | ICD-10-CM | POA: Diagnosis not present

## 2022-11-15 MED ORDER — OXYCODONE HCL 5 MG PO TABS
5.0000 mg | ORAL_TABLET | ORAL | 0 refills | Status: DC | PRN
Start: 2022-11-15 — End: 2023-03-05
  Filled 2022-11-15: qty 15, 3d supply, fill #0

## 2022-11-15 MED ORDER — ACETAMINOPHEN 500 MG PO TABS
500.0000 mg | ORAL_TABLET | Freq: Three times a day (TID) | ORAL | 0 refills | Status: AC
  Filled 2022-11-15: qty 30, 10d supply, fill #0

## 2022-11-15 MED ORDER — IBUPROFEN 800 MG PO TABS
800.0000 mg | ORAL_TABLET | Freq: Three times a day (TID) | ORAL | 0 refills | Status: AC
  Filled 2022-11-15: qty 30, 10d supply, fill #0

## 2022-11-15 MED ORDER — ASPIRIN 325 MG PO TBEC
325.0000 mg | DELAYED_RELEASE_TABLET | Freq: Every day | ORAL | 0 refills | Status: DC
  Filled 2022-11-15: qty 14, 14d supply, fill #0

## 2022-11-15 NOTE — Addendum Note (Signed)
Addended by: Jeanella Cara on: 11/15/2022 09:07 AM   Modules accepted: Orders

## 2022-11-15 NOTE — Addendum Note (Signed)
Addended by: Benancio Deeds on: 11/15/2022 10:03 AM   Modules accepted: Orders

## 2022-11-15 NOTE — Progress Notes (Signed)
Chief Complaint: Right shoulder pain     History of Present Illness:    Shelby Henson is a 52 y.o. female right-hand-dominant female presents with ongoing right shoulder pain that has been going on for last several years.  She has now had 2 injections which did not provide significant relief.  She has been working physical therapy and strengthening of the shoulder in addition to dry needling none of which are helpful.  She has been taking anti-inflammatories.  She is having persistent pain with any type of overhead activities.  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  She is having a difficult time laying directly on the side.  She is no longer to participate in working out and lifting overhead as result of this    Surgical History:   None  PMH/PSH/Family History/Social History/Meds/Allergies:    Past Medical History:  Diagnosis Date   Anemia    Asthma    well controlled   Complication of anesthesia    Depression    GERD (gastroesophageal reflux disease)    occ   Migraine    Morbid obesity (HCC)    Osteoarthritis    PONV (postoperative nausea and vomiting)    with c-section x 1   Past Surgical History:  Procedure Laterality Date   ABLATION     BREAST CYST ASPIRATION Left    CESAREAN SECTION     CESAREAN SECTION     CESAREAN SECTION     COLONOSCOPY N/A 09/22/2020   Procedure: COLONOSCOPY;  Surgeon: Pasty Spillers, MD;  Location: [facility];  Service: Endoscopy;  Laterality: N/A;   HIP SURGERY  10/2018   LAPAROSCOPY  2018   LEEP  2005   benign pathology; Dr. Luella Cook   SUPRACERVICAL ABDOMINAL HYSTERECTOMY  2010   leio/AUB; Dr. Luella Cook   TOTAL HIP ARTHROPLASTY Right 11/12/2018   Procedure: TOTAL HIP ARTHROPLASTY ANTERIOR APPROACH;  Surgeon: Lyndle Herrlich, MD;  Location: [facility] OR[facility];  Service: Orthopedics;  Laterality: Right;   Social  History   Socioeconomic History   Marital status: Divorced    Spouse name: Not on file   Number of children: 3   Years of education: Not on file   Highest education level: Associate degree: academic program  Occupational History   Not on file  Tobacco Use   Smoking status: Never   Smokeless tobacco: Never  Vaping Use   Vaping status: Never Used  Substance and Sexual Activity   Alcohol use: Yes    Alcohol/week: 0.0 standard drinks of alcohol    Comment: occasionally   Drug use: No   Sexual activity: Yes    Birth control/protection: Surgical    Comment: Hysterectomy  Other Topics Concern   Not on file  Social History Narrative   Not on file   Social Determinants of Health   Financial Resource Strain: Medium Risk (05/07/2022)   Overall Financial Resource Strain (CARDIA)    Difficulty of Paying Living Expenses: Somewhat hard  Food Insecurity: Food Insecurity Present (05/07/2022)   Hunger Vital Sign    Worried About Running Out of Food in the Last Year: Sometimes true    Ran Out of Food in the Last Year: Sometimes true  Transportation Needs: No Transportation Needs (05/07/2022)   PRAPARE - Administrator, Civil Service (Medical): No    Lack of Transportation (Non-Medical): No  Physical Activity: Sufficiently Active (05/07/2022)   Exercise Vital Sign    Days of Exercise per Week: 2 days    Minutes of Exercise per Session: 90 min  Stress: Stress Concern Present (05/07/2022)   [employer] of Occupational Health - Occupational Stress Questionnaire    Feeling of Stress : Rather much  Social Connections: Moderately Isolated (05/07/2022)   Social Connection and Isolation Panel [NHANES]    Frequency of Communication with Friends and Family: More than three times a week    Frequency of Social Gatherings with Friends and Family: Three times a week    Attends Religious Services: 1 to 4 times per year    Active Member of Clubs or Organizations: No    Attends Museum/gallery exhibitions officer: Not on file    Marital Status: Divorced   Family History  Problem Relation Age of Onset   Depression Mother    Bipolar disorder Brother    Colon cancer Maternal Grandfather        not sure of age   Breast cancer Neg Hx    Allergies  Allergen Reactions   Celecoxib Hives   Prednisone     intolerance, makes her angry. Makes her angry   Topamax [Topiramate]     Mood changes   Zolpidem    Codeine Rash   Penicillins Rash    Did it involve swelling of the face/tongue/throat, SOB, or low BP? No Did it involve sudden or severe rash/hives, skin peeling, or any reaction on the inside of your mouth or nose? Yes Did you need to seek medical attention at a hospital or doctor's office? Yes When did it last happen? More than 20 years ago       If all above answers are "NO", may proceed with cephalosporin use.    Current Outpatient Medications  Medication Sig Dispense Refill   albuterol (PROVENTIL) (2.5 MG/3ML) 0.083% nebulizer solution Take 3 mLs (2.5 mg total) by nebulization every 6 (six) hours as needed for wheezing or shortness of breath 75 mL 1   Albuterol-Budesonide (AIRSUPRA) 90-80 MCG/ACT AERO Inhale 2 puffs into the lungs 4 (four) times daily as needed. 10.7 g 1   desvenlafaxine (PRISTIQ) 50 MG 24 hr tablet Take 1 tablet (50 mg total) by mouth daily. 90 tablet 1   diclofenac Sodium (VOLTAREN) 1 % GEL Apply 2 grams topically 4 (four) times daily. 100 g 1   famotidine (PEPCID) 40 MG tablet Take 1 tablet (40 mg total) by mouth daily. 90 tablet 1   Fluticasone-Umeclidin-Vilant (TRELEGY ELLIPTA) 200-62.5-25 MCG/ACT AEPB Inhale 1 puff into the lungs daily. 60 each 6   gabapentin (NEURONTIN) 300 MG capsule Take 3 capsules (900 mg total) by mouth at bedtime. 270 capsule 1   Galcanezumab-gnlm (EMGALITY) 120 MG/ML SOAJ Inject 1 mL into the skin every 30 (thirty) days. 1 mL 5   lidocaine (LIDODERM) 5 % Place 2 patches onto the skin daily. Remove & Discard patch within 12  hours or as directed by MD 60 patch 2   methocarbamol (ROBAXIN) 500 MG tablet Take 1 tablet (500 mg total) by mouth 4 (four) times daily. 40 tablet 0   montelukast (SINGULAIR) 10 MG tablet Take 1 tablet (10 mg total) by mouth at bedtime. 90 tablet 1   pantoprazole (PROTONIX) 40 MG tablet Take 1 tablet (  40 mg total) by mouth daily. 30 tablet 1   prazosin (MINIPRESS) 1 MG capsule Take 1 capsule (1 mg total) by mouth at bedtime. 90 capsule 1   No current facility-administered medications for this visit.   No results found.  Review of Systems:   A ROS was performed including pertinent positives and negatives as documented in the HPI.  Physical Exam :   Constitutional: NAD and appears stated age Neurological: Alert and oriented Psych: Appropriate affect and cooperative There were no vitals taken for this visit.   Comprehensive Musculoskeletal Exam:    Musculoskeletal Exam    Inspection Right Left  Skin No atrophy or winging No atrophy or winging  Palpation    Tenderness Greater tuberosity, biceps None  Range of Motion    Flexion (passive) 170 170  Flexion (active) 170 170  Abduction 170 170  ER at the side 70 70  Can reach behind back to T12 T12  Strength     4 out of 5, negative belly press Full  Special Tests    Pseudoparalytic No No  Neurologic    Fires PIN, radial, median, ulnar, musculocutaneous, axillary, suprascapular, long thoracic, and spinal accessory innervated muscles. No abnormal sensibility  Vascular/Lymphatic    Radial Pulse 2+ 2+  Cervical Exam    Patient has symmetric cervical range of motion with negative Spurling's test.  Special Test: Positive speeds maneuver     Imaging:   Xray (3 views right shoulder): Normal  MRI (right shoulder): Nearly full-thickness articular sided rotator cuff tear of the supraspinatus as well as intrasubstance biceps tearing with good muscle belly on the T1 sagittal view  I personally reviewed and interpreted the  radiographs.   Assessment:   52 y.o. female right dominant female with right shoulder pain in the setting of a nearly full-thickness right rotator cuff tear of the supraspinatus.  I did describe that she also has some biceps tearing and at this time she has had multiple forms of conservative management including 2 injections, physical therapy, activity restriction, NSAID medication none of which have given her prolonged relief.  Given this we did discuss the possibility of arthroscopic intervention including rotator cuff repair with biceps tenodesis.  I did discuss the risks and limitations associated with this.  After discussion she has elected for this.  Plan :    -Plan for right shoulder arthroscopy with rotator cuff repair, acromioplasty, biceps tenodesis   After a lengthy discussion of treatment options, including risks, benefits, alternatives, complications of surgical and nonsurgical conservative options, the patient elected surgical repair.   The patient  is aware of the material risks  and complications including, but not limited to injury to adjacent structures, neurovascular injury, infection, numbness, bleeding, implant failure, thermal burns, stiffness, persistent pain, failure to heal, disease transmission from allograft, need for further surgery, dislocation, anesthetic risks, blood clots, risks of death,and others. The probabilities of surgical success and failure discussed with patient given their particular co-morbidities.The time and nature of expected rehabilitation and recovery was discussed.The patient's questions were all answered preoperatively.  No barriers to understanding were noted. I explained the natural history of the disease process and Rx rationale.  I explained to the patient what I considered to be reasonable expectations given their personal situation.  The final treatment plan was arrived at through a shared patient decision making process model.      I  personally saw and evaluated the patient, and participated in the management and treatment plan.  Huel Cote, MD Attending Physician, Orthopedic Surgery  This document was dictated using Dragon voice recognition software. A reasonable attempt at proof reading has been made to minimize errors.

## 2022-11-16 ENCOUNTER — Other Ambulatory Visit: Payer: Self-pay

## 2022-11-20 ENCOUNTER — Encounter: Payer: Self-pay | Admitting: Family Medicine

## 2022-11-20 ENCOUNTER — Other Ambulatory Visit: Payer: Self-pay

## 2022-11-21 ENCOUNTER — Other Ambulatory Visit: Payer: Self-pay

## 2022-11-21 ENCOUNTER — Encounter (HOSPITAL_BASED_OUTPATIENT_CLINIC_OR_DEPARTMENT_OTHER): Payer: Self-pay | Admitting: Orthopaedic Surgery

## 2022-11-21 ENCOUNTER — Other Ambulatory Visit (HOSPITAL_BASED_OUTPATIENT_CLINIC_OR_DEPARTMENT_OTHER): Payer: Self-pay | Admitting: Orthopaedic Surgery

## 2022-11-21 MED ORDER — MELOXICAM 15 MG PO TABS
15.0000 mg | ORAL_TABLET | Freq: Every day | ORAL | 0 refills | Status: AC
  Filled 2022-11-21: qty 30, 30d supply, fill #0

## 2022-11-22 ENCOUNTER — Ambulatory Visit: Payer: 59 | Admitting: Obstetrics and Gynecology

## 2022-11-23 ENCOUNTER — Other Ambulatory Visit: Payer: Self-pay

## 2022-11-26 ENCOUNTER — Other Ambulatory Visit: Payer: Self-pay

## 2022-11-26 ENCOUNTER — Encounter: Payer: Self-pay | Admitting: Family Medicine

## 2022-11-28 ENCOUNTER — Encounter: Payer: Self-pay | Admitting: Family Medicine

## 2022-11-30 DIAGNOSIS — M75101 Unspecified rotator cuff tear or rupture of right shoulder, not specified as traumatic: Secondary | ICD-10-CM

## 2022-11-30 HISTORY — DX: Unspecified rotator cuff tear or rupture of right shoulder, not specified as traumatic: M75.101

## 2022-12-13 NOTE — Progress Notes (Signed)
Name: Shelby Henson   MRN: [id number]    DOB: 1970-02-10   Date:12/14/2022       Progress Note  Subjective  Chief Complaint  Follow Up  HPI  GERD: she is doing well alternating PPI and H2 blocker and symptoms are controlled    Asthma moderate persistent: currently on Trelegy and also uses Airsuppra prn and seems to be under better control. She states no cough or wheezing at this time    Metabolic Syndrome/Obesity : She denies polyphagia, polydipsia or polyuria. She used to be morbidly obese with BMI above 35 with co-morbidities , GERD, OA of right hip, metabolic syndrome , however lost weight on Ozempic . Weight used to be in the 170 lbs range, however in 2021 it spiked to 193 lbs, we started her on Ozempic lost down to 170 lbs but Covid and did not feel well and stopped medication, weight went up again to 180 lbs and we resumed medication in Dec 2022. We gave [facility] in April 23 and her weight was 177 lbs she states the 1 mg dose made her unable to eat for weeks and she decided to stop taking it. Weight is trending up again today is 182.4 lbs    Chronic right hip pain: s/p replacement but still on modified duty at work and has daily pain, no longer seeing pain clinic, but sees Dr. Odis Luster once a year. She takes Meloxicam  Pain on her hip is described as burning, dull and aching, constant, still requires her to shift positions frequently to control symptoms. Seeing neurologist now , had MRI and has mild arthritis lumbar spine, and Dr. Sherryll Burger is ordering NCS and EMG it showed meralgia paresthetica . She is on higher dose of gabapentin also had three  injections done by Dr. Ashley Royalty but failed the treatment and she was going to [facility] for ablation but did not a find a provider that would be able to do the procedure in [place].  She is on gabapentin and still daily pain . Pain is now 4/10    Insomnia:  Lunesta, Seroquel, Temazepam and  Trazodone made her moody. Melatonin did not work. She is able to fall  asleep on Prazosin but unable to stay asleep    Mood swings :she has been on Pritiq for many years, she has a family history of bipolar She denies side effects, no anxiety symptoms,  phq 9 is normal today - only has fatigue . She is compliant   Low B12: she has one shot, explained it must monthly .    Migraine headache: she states symptoms usually intense , associated with nausea, photosensitivity, throbbing like and sometimes dull. Emgality is working for her. Episodes are down to about once a month , happens right before her shots   Iron deficiency anemia: she cannot tolerate eating meat, hemoccult stools negative, colonoscopy up to date, she is taking iron pills . She still feels tired, but not sleeping well at night   Rotator cuff Tear right shoulder: seeing Dr. Steward Drone and will have a repair on Dec 12 th 2024   Patient Active Problem List   Diagnosis Date Noted   Right rotator cuff tendonitis 09/11/2022   Iron deficiency anemia due to dietary causes 08/10/2022   Asthma, moderate persistent, poorly-controlled 08/10/2022   Chronic right shoulder pain 05/11/2022   Morbid obesity (HCC) 01/05/2022   Rotator cuff impingement syndrome, right 09/29/2021   Biceps tendinitis, right 09/29/2021   Meralgia paresthetica of right side  03/29/2021   Psoas tendinitis, right hip 03/29/2021   History of total right hip arthroplasty 03/29/2021   Status post total replacement of right hip 02/19/2021   Meralgia paresthetica of right side 02/19/2021   Polyp of ascending colon 02/19/2021   Polyp of sigmoid colon 02/19/2021   Special screening for malignant neoplasms, colon    Polyp of ascending colon    Polyp of sigmoid colon    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 03/23/2020   Pain in right hip 01/12/2019   Vitamin D deficiency 02/25/2018   Chronic pain 10/24/2015   Hot flashes 10/24/2015   Migraine without aura and without status migrainosus, not intractable 10/24/2015    Metabolic syndrome 10/24/2015   Dyslipidemia 10/24/2015   Allergic rhinitis, seasonal 05/17/2015   Asthma in adult, mild intermittent, with acute exacerbation 08/08/2014   Chronic insomnia 08/08/2014   DDD (degenerative disc disease), lumbar 08/08/2014   Mood disorder (HCC) 08/08/2014   Gastroesophageal reflux disease without esophagitis 08/08/2014   H/O total hysterectomy 08/08/2014   Irritable bowel syndrome with diarrhea 08/08/2014   Osteoarthritis of right hip 08/08/2014   Adult BMI 30+ 08/08/2014   SBO (spina bifida occulta) 08/08/2014    Past Surgical History:  Procedure Laterality Date   ABLATION     BREAST CYST ASPIRATION Left    CESAREAN SECTION     CESAREAN SECTION     CESAREAN SECTION     COLONOSCOPY N/A 09/22/2020   Procedure: COLONOSCOPY;  Surgeon: Pasty Spillers, MD;  Location: [facility] ENDOSCOPY;  Service: Endoscopy;  Laterality: N/A;   HIP SURGERY  10/2018   LAPAROSCOPY  2018   LEEP  2005   benign pathology; Dr. Luella Cook   SUPRACERVICAL ABDOMINAL HYSTERECTOMY  2010   leio/AUB; Dr. Luella Cook   TOTAL HIP ARTHROPLASTY Right 11/12/2018   Procedure: TOTAL HIP ARTHROPLASTY ANTERIOR APPROACH;  Surgeon: Lyndle Herrlich, MD;  Location: [facility] ORS;  Service: Orthopedics;  Laterality: Right;    Family History  Problem Relation Age of Onset   Depression Mother    Bipolar disorder Brother    Colon cancer Maternal Grandfather        not sure of age   Breast cancer Neg Hx     Social History   Tobacco Use   Smoking status: Never   Smokeless tobacco: Never  Substance Use Topics   Alcohol use: Yes    Alcohol/week: 0.0 standard drinks of alcohol    Comment: occasionally     Current Outpatient Medications:    albuterol (PROVENTIL) (2.5 MG/3ML) 0.083% nebulizer solution, Take 3 mLs (2.5 mg total) by nebulization every 6 (six) hours as needed for wheezing or shortness of breath, Disp: 75 mL, Rfl: 1   Albuterol-Budesonide (AIRSUPRA) 90-80 MCG/ACT AERO, Inhale 2 puffs  into the lungs 4 (four) times daily as needed., Disp: 10.7 g, Rfl: 1   aspirin EC 325 MG tablet, Take 1 tablet (325 mg total) by mouth daily., Disp: 14 tablet, Rfl: 0   desvenlafaxine (PRISTIQ) 50 MG 24 hr tablet, Take 1 tablet (50 mg total) by mouth daily., Disp: 90 tablet, Rfl: 1   diclofenac Sodium (VOLTAREN) 1 % GEL, Apply 2 grams topically 4 (four) times daily., Disp: 100 g, Rfl: 1   famotidine (PEPCID) 40 MG tablet, Take 1 tablet (40 mg total) by mouth daily., Disp: 90 tablet, Rfl: 1   Fluticasone-Umeclidin-Vilant (TRELEGY ELLIPTA) 200-62.5-25 MCG/ACT AEPB, Inhale 1 puff into the lungs daily., Disp: 60 each, Rfl: 6  gabapentin (NEURONTIN) 300 MG capsule, Take 3 capsules (900 mg total) by mouth at bedtime., Disp: 270 capsule, Rfl: 1   Galcanezumab-gnlm (EMGALITY) 120 MG/ML SOAJ, Inject 1 mL into the skin every 30 (thirty) days., Disp: 1 mL, Rfl: 5   lidocaine (LIDODERM) 5 %, Place 2 patches onto the skin daily. Remove & Discard patch within 12 hours or as directed by MD, Disp: 60 patch, Rfl: 2   meloxicam (MOBIC) 15 MG tablet, Take 1 tablet (15 mg total) by mouth daily., Disp: 30 tablet, Rfl: 0   methocarbamol (ROBAXIN) 500 MG tablet, Take 1 tablet (500 mg total) by mouth 4 (four) times daily., Disp: 40 tablet, Rfl: 0   oxyCODONE (ROXICODONE) 5 MG immediate release tablet, Take 1 tablet (5 mg total) by mouth every 4 (four) hours as needed for severe pain (pain score 7-10) or breakthrough pain., Disp: 15 tablet, Rfl: 0   pantoprazole (PROTONIX) 40 MG tablet, Take 1 tablet (40 mg total) by mouth daily., Disp: 30 tablet, Rfl: 1   prazosin (MINIPRESS) 1 MG capsule, Take 1 capsule (1 mg total) by mouth at bedtime., Disp: 90 capsule, Rfl: 1  Allergies  Allergen Reactions   Celecoxib Hives   Prednisone     intolerance, makes her angry. Makes her angry   Topamax [Topiramate]     Mood changes   Zolpidem    Codeine Rash   Penicillins Rash    Did it involve swelling of the face/tongue/throat,  SOB, or low BP? No Did it involve sudden or severe rash/hives, skin peeling, or any reaction on the inside of your mouth or nose? Yes Did you need to seek medical attention at a hospital or doctor's office? Yes When did it last happen? More than 20 years ago       If all above answers are "NO", may proceed with cephalosporin use.     I personally reviewed active problem list, medication list, allergies, family history, social history, health maintenance with the patient/caregiver today.   ROS  Ten systems reviewed and is negative except as mentioned in HPI    Objective  Vitals:   12/14/22 0951  BP: 122/74  Pulse: 74  Resp: 12  Temp: 98.1 F (36.7 C)  TempSrc: Oral  SpO2: 98%  Weight: 182 lb 6.4 oz (82.7 kg)  Height: 4\' 11"  (1.499 m)    Body mass index is 36.84 kg/m.  Physical Exam  Constitutional: Patient appears well-developed and well-nourished. Obese  No distress.  HEENT: head atraumatic, normocephalic, pupils equal and reactive to light, neck supple Cardiovascular: Normal rate, regular rhythm and normal heart sounds.  No murmur heard. No BLE edema. Pulmonary/Chest: Effort normal and breath sounds normal. No respiratory distress. Abdominal: Soft.  There is no tenderness. Psychiatric: Patient has a normal mood and affect. behavior is normal. Judgment and thought content normal.   PHQ2/9:    12/14/2022    9:50 AM 08/10/2022   11:01 AM 05/11/2022   11:16 AM 01/05/2022    8:36 AM 11/03/2021   10:46 AM  Depression screen PHQ 2/9  Decreased Interest 0 0 0 0 0  Down, Depressed, Hopeless 0 0 0 0 0  PHQ - 2 Score 0 0 0 0 0  Altered sleeping 0 3 3 3 3   Tired, decreased energy 0 3 3 0 0  Change in appetite 0 0 1 0 0  Feeling bad or failure about yourself  0 0 0 0 0  Trouble concentrating 0 1 2 0 1  Moving slowly or fidgety/restless 0 0 0 0 0  Suicidal thoughts 0 0 0 0 0  PHQ-9 Score 0 7 9 3 4   Difficult doing work/chores  Not difficult at all Somewhat difficult       phq 9 is negative   Fall Risk:    12/14/2022    9:50 AM 09/11/2022    9:00 AM 08/10/2022   11:01 AM 07/02/2022   11:28 AM 05/11/2022   11:04 AM  Fall Risk   Falls in the past year? 1 1 0 0 0  Number falls in past yr: 0 0     Injury with Fall? 1 1     Comment  bruising on right side, right shoulder pain     Risk for fall due to : History of fall(s);Orthopedic patient  No Fall Risks No Fall Risks No Fall Risks  Follow up Falls prevention discussed  Falls prevention discussed Falls prevention discussed Falls prevention discussed      Functional Status Survey: Is the patient deaf or have difficulty hearing?: No Does the patient have difficulty seeing, even when wearing glasses/contacts?: No Does the patient have difficulty concentrating, remembering, or making decisions?: No Does the patient have difficulty walking or climbing stairs?: Yes Does the patient have difficulty dressing or bathing?: No Does the patient have difficulty doing errands alone such as visiting a doctor's office or shopping?: No    Assessment & Plan  1. Morbid obesity (HCC)  Discussed with the patient the risk posed by an increased BMI. Discussed importance of portion control, calorie counting and at least 150 minutes of physical activity weekly. Avoid sweet beverages and drink more water. Eat at least 6 servings of fruit and vegetables daily    2. Mood disorder (HCC)  On pristiq   3. Need for immunization against influenza  Up to date   4. Chronic insomnia  Increased dose of medication to 2 mg at bedtime   5. Migraine without aura and without status migrainosus, not intractable  Doing well   6. Iron deficiency anemia due to dietary causes  - CBC with Differential/Platelet - Iron, TIBC and Ferritin Panel  7. B12 deficiency  - cyanocobalamin (VITAMIN B12) injection 1,000 mcg  8. Meralgia paresthetica of right side  - gabapentin (NEURONTIN) 300 MG capsule; Take 3 capsules (900 mg total) by  mouth at bedtime.  Dispense: 270 capsule; Refill: 1  9. Vitamin D deficiency  Continue supplementation  10. Asthma, moderate persistent, well-controlled   11. Metabolic syndrome  - Hemoglobin A1c  12. Rotator cuff tear arthropathy of right shoulder  Keep appointment with ortho

## 2022-12-14 ENCOUNTER — Other Ambulatory Visit: Payer: Self-pay

## 2022-12-14 ENCOUNTER — Encounter: Payer: Self-pay | Admitting: Family Medicine

## 2022-12-14 ENCOUNTER — Ambulatory Visit (INDEPENDENT_AMBULATORY_CARE_PROVIDER_SITE_OTHER): Payer: 59 | Admitting: Family Medicine

## 2022-12-14 DIAGNOSIS — E538 Deficiency of other specified B group vitamins: Secondary | ICD-10-CM | POA: Diagnosis not present

## 2022-12-14 DIAGNOSIS — G5711 Meralgia paresthetica, right lower limb: Secondary | ICD-10-CM

## 2022-12-14 DIAGNOSIS — D508 Other iron deficiency anemias: Secondary | ICD-10-CM

## 2022-12-14 DIAGNOSIS — E8881 Metabolic syndrome: Secondary | ICD-10-CM

## 2022-12-14 DIAGNOSIS — G43009 Migraine without aura, not intractable, without status migrainosus: Secondary | ICD-10-CM

## 2022-12-14 DIAGNOSIS — M75101 Unspecified rotator cuff tear or rupture of right shoulder, not specified as traumatic: Secondary | ICD-10-CM

## 2022-12-14 DIAGNOSIS — F39 Unspecified mood [affective] disorder: Secondary | ICD-10-CM

## 2022-12-14 DIAGNOSIS — E559 Vitamin D deficiency, unspecified: Secondary | ICD-10-CM | POA: Diagnosis not present

## 2022-12-14 DIAGNOSIS — J4540 Moderate persistent asthma, uncomplicated: Secondary | ICD-10-CM

## 2022-12-14 DIAGNOSIS — F5104 Psychophysiologic insomnia: Secondary | ICD-10-CM

## 2022-12-14 DIAGNOSIS — Z23 Encounter for immunization: Secondary | ICD-10-CM

## 2022-12-14 MED ORDER — GABAPENTIN 300 MG PO CAPS
900.0000 mg | ORAL_CAPSULE | Freq: Every day | ORAL | 1 refills | Status: DC
Start: 2022-12-14 — End: 2023-09-19
  Filled 2022-12-14 – 2023-03-04 (×2): qty 270, 90d supply, fill #0
  Filled 2023-06-06: qty 270, 90d supply, fill #1

## 2022-12-14 MED ORDER — PRAZOSIN HCL 2 MG PO CAPS
2.0000 mg | ORAL_CAPSULE | Freq: Every day | ORAL | 0 refills | Status: DC
  Filled 2022-12-14: qty 90, 90d supply, fill #0

## 2022-12-14 MED ORDER — CYANOCOBALAMIN 1000 MCG/ML IJ SOLN
1000.0000 ug | Freq: Once | INTRAMUSCULAR | Status: AC
  Administered 2022-12-14: 1000 ug via INTRAMUSCULAR

## 2022-12-19 ENCOUNTER — Other Ambulatory Visit: Payer: Self-pay

## 2022-12-19 ENCOUNTER — Other Ambulatory Visit: Payer: Self-pay | Admitting: Pulmonary Disease

## 2022-12-19 DIAGNOSIS — J4540 Moderate persistent asthma, uncomplicated: Secondary | ICD-10-CM

## 2022-12-19 MED ORDER — PANTOPRAZOLE SODIUM 40 MG PO TBEC
40.0000 mg | DELAYED_RELEASE_TABLET | Freq: Every day | ORAL | 1 refills | Status: AC
  Filled 2022-12-19: qty 30, 30d supply, fill #0
  Filled 2023-04-04: qty 30, 30d supply, fill #1

## 2022-12-21 DIAGNOSIS — D508 Other iron deficiency anemias: Secondary | ICD-10-CM | POA: Diagnosis not present

## 2022-12-21 DIAGNOSIS — E8881 Metabolic syndrome: Secondary | ICD-10-CM | POA: Diagnosis not present

## 2022-12-22 LAB — CBC WITH DIFFERENTIAL/PLATELET
Absolute Lymphocytes: 1798 {cells}/uL (ref 850–3900)
Absolute Monocytes: 409 {cells}/uL (ref 200–950)
Basophils Absolute: 50 {cells}/uL (ref 0–200)
Basophils Relative: 0.8 %
Eosinophils Absolute: 614 {cells}/uL — ABNORMAL HIGH (ref 15–500)
Eosinophils Relative: 9.9 %
HCT: 36.1 % (ref 35.0–45.0)
Hemoglobin: 11.7 g/dL (ref 11.7–15.5)
MCH: 27.2 pg (ref 27.0–33.0)
MCHC: 32.4 g/dL (ref 32.0–36.0)
MCV: 84 fL (ref 80.0–100.0)
MPV: 12.9 fL — ABNORMAL HIGH (ref 7.5–12.5)
Monocytes Relative: 6.6 %
Neutro Abs: 3329 {cells}/uL (ref 1500–7800)
Neutrophils Relative %: 53.7 %
Platelets: 238 10*3/uL (ref 140–400)
RBC: 4.3 10*6/uL (ref 3.80–5.10)
RDW: 14.5 % (ref 11.0–15.0)
Total Lymphocyte: 29 %
WBC: 6.2 10*3/uL (ref 3.8–10.8)

## 2022-12-22 LAB — HEMOGLOBIN A1C
Hgb A1c MFr Bld: 5.5 %{Hb} (ref <5.7)
Mean Plasma Glucose: 111 mg/dL
eAG (mmol/L): 6.2 mmol/L

## 2022-12-22 LAB — IRON,TIBC AND FERRITIN PANEL
%SAT: 17 % (ref 16–45)
Ferritin: 24 ng/mL (ref 16–232)
Iron: 66 ug/dL (ref 45–160)
TIBC: 380 ug/dL (ref 250–450)

## 2022-12-25 ENCOUNTER — Encounter: Payer: Self-pay | Admitting: Pulmonary Disease

## 2022-12-25 ENCOUNTER — Encounter (HOSPITAL_BASED_OUTPATIENT_CLINIC_OR_DEPARTMENT_OTHER): Payer: Self-pay | Admitting: Orthopaedic Surgery

## 2022-12-25 ENCOUNTER — Encounter
Admission: RE | Admit: 2022-12-25 | Discharge: 2022-12-25 | Disposition: A | Discharge status: Home / Self Care | Payer: 59 | Source: Ambulatory Visit | Attending: Orthopaedic Surgery | Admitting: Orthopaedic Surgery

## 2022-12-25 ENCOUNTER — Other Ambulatory Visit: Payer: Self-pay

## 2022-12-25 HISTORY — DX: Iron deficiency anemia, unspecified: D50.9

## 2022-12-25 HISTORY — DX: Unilateral primary osteoarthritis, right hip: M16.11

## 2022-12-25 HISTORY — DX: Vitamin D deficiency, unspecified: E55.9

## 2022-12-25 HISTORY — DX: Other intervertebral disc degeneration, lumbar region without mention of lumbar back pain or lower extremity pain: M51.369

## 2022-12-25 HISTORY — DX: Polyp of colon: K63.5

## 2022-12-25 NOTE — Patient Instructions (Signed)
Your procedure is scheduled on: Thursday, December 5 Report to the Registration Desk on the 1st floor of the [facility] To find out your arrival time, please call [phone] between 1PM - 3PM on: Wednesday, December 4 If your arrival time is 6:00 am, do not arrive before that time as the [facility] entrance doors do not open until 6:00 am.  REMEMBER: Instructions that are not followed completely may result in serious medical risk, up to and including death; or upon the discretion of your surgeon and anesthesiologist your surgery may need to be rescheduled.  Do not eat food after midnight the night before surgery.  No gum chewing or hard candies.  You may however, drink CLEAR liquids up to 2 hours before you are scheduled to arrive for your surgery. Do not drink anything within 2 hours of your scheduled arrival time.  Clear liquids include: - water  - apple juice without pulp - gatorade (not RED colors) - black coffee or tea (Do NOT add milk or creamers to the coffee or tea) Do NOT drink anything that is not on this list.  In addition, your doctor has ordered for you to drink the provided:  Ensure Pre-Surgery Clear Carbohydrate Drink  Drinking this carbohydrate drink up to two hours before surgery helps to reduce insulin resistance and improve patient outcomes. Please complete drinking 2 hours before scheduled arrival time.  One week prior to surgery: starting November 28 Stop Anti-inflammatories (NSAIDS) such as Advil, Aleve, Ibuprofen, Motrin, Naproxen, Naprosyn and Aspirin based products such as Excedrin, Goody's Powder, BC Powder. Stop ANY OVER THE COUNTER supplements until after surgery.  You may however, continue to take Tylenol if needed for pain up until the day of surgery.  Continue taking all of your other prescription medications up until the day of surgery.  ON THE DAY OF SURGERY ONLY TAKE THESE MEDICATIONS WITH SIPS OF WATER:  Albuterol nebulizer Desvenlafaxine  (Pristiq) Famotidine (Pepcid)  No Alcohol for 24 hours before or after surgery.  No Smoking including e-cigarettes for 24 hours before surgery.  No chewable tobacco products for at least 6 hours before surgery.  No nicotine patches on the day of surgery.  Do not use any "recreational" drugs for at least a week (preferably 2 weeks) before your surgery.  Please be advised that the combination of cocaine and anesthesia may have negative outcomes, up to and including death. If you test positive for cocaine, your surgery will be cancelled.  On the morning of surgery brush your teeth with toothpaste and water, you may rinse your mouth with mouthwash if you wish. Do not swallow any toothpaste or mouthwash.  Use CHG Soap as directed on instruction sheet.  Do not wear jewelry, make-up, hairpins, clips or nail polish.  For welded (permanent) jewelry: bracelets, anklets, waist bands, etc.  Please have this removed prior to surgery.  If it is not removed, there is a chance that hospital personnel will need to cut it off on the day of surgery.  Do not wear lotions, powders, or perfumes.   Do not shave body hair from the neck down 48 hours before surgery.  Contact lenses, hearing aids and dentures may not be worn into surgery.  Do not bring valuables to the hospital. [facility] is not responsible for any missing/lost belongings or valuables.   Notify your doctor if there is any change in your medical condition (cold, fever, infection).  Wear comfortable clothing (specific to your surgery type) to the  hospital.  After surgery, you can help prevent lung complications by doing breathing exercises.  Take deep breaths and cough every 1-2 hours. Your doctor may order a device called an Incentive Spirometer to help you take deep breaths.  If you are being discharged the day of surgery, you will not be allowed to drive home. You will need a responsible individual to drive you home and stay with you  for 24 hours after surgery.   If you are taking public transportation, you will need to have a responsible individual with you.  Please call the Pre-admissions Testing Dept. at [phone] if you have any questions about these instructions.  Surgery Visitation Policy:  Patients having surgery or a procedure may have two visitors.  Children under the age of 22 must have an adult with them who is not the patient.     Preparing for Surgery with CHLORHEXIDINE GLUCONATE (CHG) Soap  Chlorhexidine Gluconate (CHG) Soap  o An antiseptic cleaner that kills germs and bonds with the skin to continue killing germs even after washing  o Used for showering the night before surgery and morning of surgery  Before surgery, you can play an important role by reducing the number of germs on your skin.  CHG (Chlorhexidine gluconate) soap is an antiseptic cleanser which kills germs and bonds with the skin to continue killing germs even after washing.  Please do not use if you have an allergy to CHG or antibacterial soaps. If your skin becomes reddened/irritated stop using the CHG.  1. Shower the NIGHT BEFORE SURGERY and the MORNING OF SURGERY with CHG soap.  2. If you choose to wash your hair, wash your hair first as usual with your normal shampoo.  3. After shampooing, rinse your hair and body thoroughly to remove the shampoo.  4. Use CHG as you would any other liquid soap. You can apply CHG directly to the skin and wash gently with a scrungie or a clean washcloth.  5. Apply the CHG soap to your body only from the neck down. Do not use on open wounds or open sores. Avoid contact with your eyes, ears, mouth, and genitals (private parts). Wash face and genitals (private parts) with your normal soap.  6. Wash thoroughly, paying special attention to the area where your surgery will be performed.  7. Thoroughly rinse your body with warm water.  8. Do not shower/wash with your normal soap after using  and rinsing off the CHG soap.  9. Pat yourself dry with a clean towel.  10. Wear clean pajamas to bed the night before surgery.  12. Place clean sheets on your bed the night of your first shower and do not sleep with pets.  13. Shower again with the CHG soap on the day of surgery prior to arriving at the hospital.  14. Do not apply any deodorants/lotions/powders.  15. Please wear clean clothes to the hospital.

## 2022-12-31 ENCOUNTER — Inpatient Hospital Stay: Admission: RE | Admit: 2022-12-31 | Payer: 59 | Source: Ambulatory Visit

## 2022-12-31 ENCOUNTER — Encounter: Payer: Self-pay | Admitting: Family Medicine

## 2023-01-02 MED ORDER — TRANEXAMIC ACID-NACL 1000-0.7 MG/100ML-% IV SOLN
1000.0000 mg | INTRAVENOUS | Status: AC
  Administered 2023-01-03: 1000 mg via INTRAVENOUS

## 2023-01-02 MED ORDER — ORAL CARE MOUTH RINSE: 15.0000 mL | Freq: Once | OROMUCOSAL | Status: AC

## 2023-01-02 MED ORDER — ACETAMINOPHEN 500 MG PO TABS
1000.0000 mg | ORAL_TABLET | Freq: Once | ORAL | Status: AC
  Administered 2023-01-03: 1000 mg via ORAL

## 2023-01-02 MED ORDER — CEFAZOLIN SODIUM-DEXTROSE 2-4 GM/100ML-% IV SOLN
2.0000 g | INTRAVENOUS | Status: AC
  Administered 2023-01-03: 2 g via INTRAVENOUS

## 2023-01-02 MED ORDER — CHLORHEXIDINE GLUCONATE 0.12 % MT SOLN
15.0000 mL | Freq: Once | OROMUCOSAL | Status: AC
  Administered 2023-01-03: 15 mL via OROMUCOSAL

## 2023-01-02 MED ORDER — GABAPENTIN 300 MG PO CAPS
300.0000 mg | ORAL_CAPSULE | Freq: Once | ORAL | Status: AC
  Administered 2023-01-03: 300 mg via ORAL

## 2023-01-02 MED ORDER — LACTATED RINGERS IV SOLN: INTRAVENOUS | Status: DC

## 2023-01-03 ENCOUNTER — Ambulatory Visit: Payer: 59 | Admitting: Anesthesiology

## 2023-01-03 ENCOUNTER — Ambulatory Visit
Admission: RE | Admit: 2023-01-03 | Discharge: 2023-01-03 | Disposition: A | Discharge status: Home / Self Care | Payer: 59 | Attending: Orthopaedic Surgery | Admitting: Orthopaedic Surgery

## 2023-01-03 ENCOUNTER — Other Ambulatory Visit: Payer: Self-pay

## 2023-01-03 ENCOUNTER — Encounter
Admission: RE | Disposition: A | Discharge status: Home / Self Care | Payer: Self-pay | Source: Home / Self Care | Attending: Orthopaedic Surgery

## 2023-01-03 ENCOUNTER — Ambulatory Visit: Payer: 59

## 2023-01-03 ENCOUNTER — Encounter: Payer: Self-pay | Admitting: Orthopaedic Surgery

## 2023-01-03 DIAGNOSIS — Z79899 Other long term (current) drug therapy: Secondary | ICD-10-CM | POA: Diagnosis not present

## 2023-01-03 DIAGNOSIS — K219 Gastro-esophageal reflux disease without esophagitis: Secondary | ICD-10-CM | POA: Insufficient documentation

## 2023-01-03 DIAGNOSIS — M25811 Other specified joint disorders, right shoulder: Secondary | ICD-10-CM | POA: Diagnosis not present

## 2023-01-03 DIAGNOSIS — M75101 Unspecified rotator cuff tear or rupture of right shoulder, not specified as traumatic: Secondary | ICD-10-CM | POA: Diagnosis not present

## 2023-01-03 DIAGNOSIS — G8918 Other acute postprocedural pain: Secondary | ICD-10-CM | POA: Diagnosis not present

## 2023-01-03 DIAGNOSIS — M1990 Unspecified osteoarthritis, unspecified site: Secondary | ICD-10-CM | POA: Insufficient documentation

## 2023-01-03 DIAGNOSIS — M75121 Complete rotator cuff tear or rupture of right shoulder, not specified as traumatic: Secondary | ICD-10-CM | POA: Diagnosis present

## 2023-01-03 DIAGNOSIS — Z5986 Financial insecurity: Secondary | ICD-10-CM | POA: Diagnosis not present

## 2023-01-03 DIAGNOSIS — J45909 Unspecified asthma, uncomplicated: Secondary | ICD-10-CM | POA: Diagnosis not present

## 2023-01-03 DIAGNOSIS — M75111 Incomplete rotator cuff tear or rupture of right shoulder, not specified as traumatic: Secondary | ICD-10-CM

## 2023-01-03 HISTORY — PX: SHOULDER ACROMIOPLASTY: SHX6093

## 2023-01-03 HISTORY — PX: SHOULDER ARTHROSCOPY WITH ROTATOR CUFF REPAIR AND OPEN BICEPS TENODESIS: SHX6677

## 2023-01-03 SURGERY — SHOULDER ARTHROSCOPY WITH ROTATOR CUFF REPAIR AND OPEN BICEPS TENODESIS
Anesthesia: General | Site: Shoulder | Laterality: Right

## 2023-01-03 MED ORDER — BUPIVACAINE LIPOSOME 1.3 % IJ SUSP
INTRAMUSCULAR | Status: DC | PRN
  Administered 2023-01-03: 13 mL via PERINEURAL
  Administered 2023-01-03: 7 mL via PERINEURAL

## 2023-01-03 MED ORDER — OXYCODONE HCL 5 MG PO TABS
5.0000 mg | ORAL_TABLET | Freq: Once | ORAL | Status: AC | PRN
  Administered 2023-01-03: 5 mg via ORAL

## 2023-01-03 MED ORDER — LACTATED RINGERS IR SOLN
Status: DC | PRN
  Administered 2023-01-03 (×2): 3001 mL

## 2023-01-03 MED ORDER — DEXAMETHASONE SODIUM PHOSPHATE 10 MG/ML IJ SOLN
INTRAMUSCULAR | Status: DC | PRN
  Administered 2023-01-03: 8 mg via INTRAVENOUS

## 2023-01-03 MED ORDER — SUGAMMADEX SODIUM 200 MG/2ML IV SOLN
INTRAVENOUS | Status: DC | PRN
  Administered 2023-01-03: 200 mg via INTRAVENOUS

## 2023-01-03 MED ORDER — PROPOFOL 500 MG/50ML IV EMUL
INTRAVENOUS | Status: DC | PRN
  Administered 2023-01-03: 30 ug/kg/min via INTRAVENOUS

## 2023-01-03 MED ORDER — BUPIVACAINE HCL (PF) 0.5 % IJ SOLN
INTRAMUSCULAR | Status: DC | PRN
  Administered 2023-01-03: 3 mL via PERINEURAL
  Administered 2023-01-03: 7 mL via PERINEURAL

## 2023-01-03 MED ORDER — LACTATED RINGERS IR SOLN
Status: DC | PRN
  Administered 2023-01-03 (×2): 6000 mL

## 2023-01-03 MED ORDER — BUPIVACAINE LIPOSOME 1.3 % IJ SUSP
INTRAMUSCULAR | Status: AC
  Filled 2023-01-03: qty 20

## 2023-01-03 MED ORDER — FENTANYL CITRATE PF 50 MCG/ML IJ SOSY
PREFILLED_SYRINGE | INTRAMUSCULAR | Status: AC
  Filled 2023-01-03: qty 1

## 2023-01-03 MED ORDER — LIDOCAINE HCL (CARDIAC) PF 100 MG/5ML IV SOSY
PREFILLED_SYRINGE | INTRAVENOUS | Status: DC | PRN
  Administered 2023-01-03: 20 mg via INTRAVENOUS

## 2023-01-03 MED ORDER — ROCURONIUM BROMIDE 100 MG/10ML IV SOLN
INTRAVENOUS | Status: DC | PRN
  Administered 2023-01-03: 50 mg via INTRAVENOUS

## 2023-01-03 MED ORDER — FENTANYL CITRATE (PF) 100 MCG/2ML IJ SOLN
INTRAMUSCULAR | Status: DC | PRN
  Administered 2023-01-03 (×2): 50 ug via INTRAVENOUS

## 2023-01-03 MED ORDER — ONDANSETRON HCL 4 MG/2ML IJ SOLN
INTRAMUSCULAR | Status: DC | PRN
  Administered 2023-01-03: 4 mg via INTRAVENOUS

## 2023-01-03 MED ORDER — PROPOFOL 10 MG/ML IV BOLUS
INTRAVENOUS | Status: AC
  Filled 2023-01-03: qty 40

## 2023-01-03 MED ORDER — LIDOCAINE HCL (PF) 1 % IJ SOLN
INTRAMUSCULAR | Status: AC
  Filled 2023-01-03: qty 5

## 2023-01-03 MED ORDER — OXYCODONE HCL 5 MG PO TABS
ORAL_TABLET | ORAL | Status: AC
  Filled 2023-01-03: qty 1

## 2023-01-03 MED ORDER — FENTANYL CITRATE (PF) 100 MCG/2ML IJ SOLN
25.0000 ug | INTRAMUSCULAR | Status: DC | PRN
Start: 2023-01-03 — End: 2023-01-03
  Administered 2023-01-03: 25 ug via INTRAVENOUS

## 2023-01-03 MED ORDER — EPINEPHRINE PF 1 MG/ML IJ SOLN
INTRAMUSCULAR | Status: AC
  Filled 2023-01-03: qty 2

## 2023-01-03 MED ORDER — FENTANYL CITRATE PF 50 MCG/ML IJ SOSY
50.0000 ug | PREFILLED_SYRINGE | Freq: Once | INTRAMUSCULAR | Status: AC
  Administered 2023-01-03: 50 ug via INTRAVENOUS

## 2023-01-03 MED ORDER — MIDAZOLAM HCL 2 MG/2ML IJ SOLN
INTRAMUSCULAR | Status: AC
  Filled 2023-01-03: qty 2

## 2023-01-03 MED ORDER — FENTANYL CITRATE (PF) 100 MCG/2ML IJ SOLN
INTRAMUSCULAR | Status: AC
  Filled 2023-01-03: qty 2

## 2023-01-03 MED ORDER — MIDAZOLAM HCL 2 MG/2ML IJ SOLN
INTRAMUSCULAR | Status: AC
Start: 2023-01-03 — End: ?
  Filled 2023-01-03: qty 2

## 2023-01-03 MED ORDER — OXYCODONE HCL 5 MG/5ML PO SOLN: 5.0000 mg | Freq: Once | ORAL | Status: AC | PRN

## 2023-01-03 MED ORDER — BUPIVACAINE HCL (PF) 0.5 % IJ SOLN
INTRAMUSCULAR | Status: AC
  Filled 2023-01-03: qty 10

## 2023-01-03 MED ORDER — MIDAZOLAM HCL 2 MG/2ML IJ SOLN
1.0000 mg | Freq: Once | INTRAMUSCULAR | Status: AC
  Administered 2023-01-03: 1 mg via INTRAVENOUS

## 2023-01-03 MED ORDER — BUPIVACAINE HCL (PF) 0.25 % IJ SOLN
INTRAMUSCULAR | Status: AC
  Filled 2023-01-03: qty 30

## 2023-01-03 MED ORDER — ACETAMINOPHEN 500 MG PO TABS
ORAL_TABLET | ORAL | Status: AC
  Filled 2023-01-03: qty 2

## 2023-01-03 MED ORDER — PROPOFOL 10 MG/ML IV BOLUS
INTRAVENOUS | Status: DC | PRN
  Administered 2023-01-03: 120 mg via INTRAVENOUS

## 2023-01-03 MED ORDER — GABAPENTIN 300 MG PO CAPS
ORAL_CAPSULE | ORAL | Status: AC
  Filled 2023-01-03: qty 1

## 2023-01-03 MED ORDER — PHENYLEPHRINE HCL-NACL 20-0.9 MG/250ML-% IV SOLN
INTRAVENOUS | Status: DC | PRN
  Administered 2023-01-03: 30 ug/min via INTRAVENOUS

## 2023-01-03 MED ORDER — MIDAZOLAM HCL 2 MG/2ML IJ SOLN
INTRAMUSCULAR | Status: DC | PRN
  Administered 2023-01-03: 2 mg via INTRAVENOUS

## 2023-01-03 SURGICAL SUPPLY — 64 items
ANCHOR DUAL INSERTER (Anchor) IMPLANT
ANCHOR JUGGERKNOT SOFT S-P (Anchor) IMPLANT
ANCHOR SUT QUATTRO KNTLS 4.5 (Anchor) IMPLANT
BLADE EXCALIBUR 4.0X13 (MISCELLANEOUS) ×2 IMPLANT
BLADE SHAVER 4.5X7 STR FR (MISCELLANEOUS) ×2 IMPLANT
BUR ABRADER 4.0 W/FLUTE AQUA (MISCELLANEOUS) ×2 IMPLANT
BURR ABRADER 4.0 W/FLUTE AQUA (MISCELLANEOUS) ×2
BURR OVAL 8 FLU 4.0X13 (MISCELLANEOUS) IMPLANT
CANNULA 5.75X71 LONG (CANNULA) IMPLANT
CANNULA PASSPORT 5 (CANNULA) IMPLANT
CANNULA PASSPORT BUTTON 10-40 (CANNULA) IMPLANT
CANNULA TWIST IN 8.25X7CM (CANNULA) ×2 IMPLANT
CHLORAPREP W/TINT 26 (MISCELLANEOUS) ×4 IMPLANT
COOLER POLAR GLACIER W/PUMP (MISCELLANEOUS) ×2 IMPLANT
DRAPE ARTHROSCOPY W/POUCH 90 (DRAPES) ×2 IMPLANT
DRAPE IMP U-DRAPE 54X76 (DRAPES) ×2 IMPLANT
DRAPE INCISE IOBAN 66X45 STRL (DRAPES) ×2 IMPLANT
DRAPE U-SHAPE 47X51 STRL (DRAPES) ×4 IMPLANT
DW OUTFLOW CASSETTE/TUBE SET (MISCELLANEOUS) ×2 IMPLANT
GAUZE PAD ABD 8X10 STRL (GAUZE/BANDAGES/DRESSINGS) ×2 IMPLANT
GAUZE SPONGE 4X4 12PLY STRL (GAUZE/BANDAGES/DRESSINGS) ×2 IMPLANT
GAUZE XEROFORM 1X8 LF (GAUZE/BANDAGES/DRESSINGS) ×2 IMPLANT
GLOVE BIO SURGEON STRL SZ 6 (GLOVE) ×4 IMPLANT
GLOVE BIO SURGEON STRL SZ7.5 (GLOVE) ×2 IMPLANT
GLOVE BIOGEL PI IND STRL 6.5 (GLOVE) ×2 IMPLANT
GLOVE BIOGEL PI IND STRL 8 (GLOVE) ×2 IMPLANT
GOWN SRG XL LVL 3 NONREINFORCE (GOWNS) ×2 IMPLANT
GOWN STRL REUS W/ TWL LRG LVL3 (GOWN DISPOSABLE) ×4 IMPLANT
IV LR IRRIG 3000ML ARTHROMATIC (IV SOLUTION) ×2 IMPLANT
KIT STABILIZATION SHOULDER (MISCELLANEOUS) ×2 IMPLANT
KIT STR SPEAR 1.8 FBRTK DISP (KITS) IMPLANT
LASSO 90 CVE QUICKPAS (DISPOSABLE) IMPLANT
LASSO CRESCENT QUICKPASS (SUTURE) IMPLANT
MANIFOLD NEPTUNE II (INSTRUMENTS) ×2 IMPLANT
MASK FACE SPIDER DISP (MASK) ×2 IMPLANT
NDL HD SCORPION MEGA LOADER (NEEDLE) IMPLANT
NDL SAFETY ECLIPSE 18X1.5 (NEEDLE) ×2 IMPLANT
NDL SUT PASSER RTC (NEEDLE) IMPLANT
NEEDLE SUT PASSER RTC (NEEDLE) ×2 IMPLANT
PACK ARTHROSCOPY SHOULDER (MISCELLANEOUS) ×2 IMPLANT
PAD ABD DERMACEA PRESS 5X9 (GAUZE/BANDAGES/DRESSINGS) ×2 IMPLANT
PAD WRAPON POLAR SHDR UNIV (MISCELLANEOUS) IMPLANT
PAD WRAPON POLAR SHDR XLG (MISCELLANEOUS) ×2 IMPLANT
PORT APPOLLO RF 90DEGREE MULTI (SURGICAL WAND) IMPLANT
SUT ETHILON 3 0 PS 1 (SUTURE) ×2 IMPLANT
SUT FIBERWIRE #2 38 T-5 BLUE (SUTURE)
SUT PDS AB 1 CT 36 (SUTURE) IMPLANT
SUTURE FIBERWR #2 38 T-5 BLUE (SUTURE) IMPLANT
SUTURE TAPE 1.3 40 TPR END (SUTURE) IMPLANT
SUTURE TAPE TIGERLINK 1.3MM BL (SUTURE) IMPLANT
SUTURETAPE 1.3 40 TPR END (SUTURE)
SUTURETAPE TIGERLINK 1.3MM BL (SUTURE)
SYR 5ML LL (SYRINGE) ×2 IMPLANT
TAPE CLOTH SURG 4X10 WHT LF (GAUZE/BANDAGES/DRESSINGS) ×2 IMPLANT
TAPE FIBER 2MM 7IN #2 BLUE (SUTURE) IMPLANT
TAPESTRY RC STS 28X28 (Orthopedic Implant) ×2 IMPLANT
TENDON TAPESTRY RC STS 28X28 (Orthopedic Implant) IMPLANT
TOWEL OR 17X26 4PK STRL BLUE (TOWEL DISPOSABLE) ×2 IMPLANT
TUBE SET DOUBLEFLO INFLOW (TUBING) ×2 IMPLANT
TUBE SET DOUBLEFLO OUTFLOW (TUBING) ×2 IMPLANT
TUBING CONNECTING 10 (TUBING) ×2 IMPLANT
WAND WEREWOLF FLOW 90D (MISCELLANEOUS) ×2 IMPLANT
WRAPON POLAR PAD SHDR UNIV (MISCELLANEOUS)
WRAPON POLAR PAD SHDR XLG (MISCELLANEOUS) ×2

## 2023-01-03 NOTE — Anesthesia Preprocedure Evaluation (Signed)
Anesthesia Evaluation  Patient identified by MRN, date of birth, ID band Patient awake    Reviewed: Allergy & Precautions, NPO status , Patient's Chart, lab work & pertinent test results  History of Anesthesia Complications (+) PONV and history of anesthetic complications  Airway Mallampati: III  TM Distance: >3 FB Neck ROM: full    Dental  (+) Chipped, Poor Dentition   Pulmonary asthma    Pulmonary exam normal        Cardiovascular negative cardio ROS Normal cardiovascular exam     Neuro/Psych  Headaches PSYCHIATRIC DISORDERS       Neuromuscular disease    GI/Hepatic Neg liver ROS,GERD  Controlled,,  Endo/Other  negative endocrine ROS    Renal/GU      Musculoskeletal   Abdominal   Peds  Hematology negative hematology ROS (+)   Anesthesia Other Findings Past Medical History: No date: Anemia No date: Asthma     Comment:  well controlled No date: Colon polyps No date: Complication of anesthesia No date: DDD (degenerative disc disease), lumbar No date: Depression No date: GERD (gastroesophageal reflux disease)     Comment:  occ No date: Iron deficiency anemia No date: Migraine No date: Morbid obesity (HCC) No date: Osteoarthritis No date: Osteoarthritis of right hip No date: PONV (postoperative nausea and vomiting)     Comment:  with c-section x 1 11/2022: Right rotator cuff tear No date: Vitamin D deficiency  Past Surgical History: No date: BREAST CYST ASPIRATION; Left 1990: CESAREAN SECTION 1993: CESAREAN SECTION 1996: CESAREAN SECTION 09/22/2020: COLONOSCOPY; N/A     Comment:  Procedure: COLONOSCOPY;  Surgeon: Pasty Spillers,               MD;  Location: [facility] ENDOSCOPY;  Service: Endoscopy;                Laterality: N/A; 2018: LAPAROSCOPY 2005: LEEP     Comment:  benign pathology; Dr. Luella Cook 2010: SUPRACERVICAL ABDOMINAL HYSTERECTOMY     Comment:  leio/AUB; Dr. Luella Cook 11/12/2018:  TOTAL HIP ARTHROPLASTY; Right     Comment:  Procedure: TOTAL HIP ARTHROPLASTY ANTERIOR APPROACH;                Surgeon: Lyndle Herrlich, MD;  Location: [facility] ORS;                Service: Orthopedics;  Laterality: Right;  BMI    Body Mass Index: 33.93 kg/m      Reproductive/Obstetrics negative OB ROS                             Anesthesia Physical Anesthesia Plan  ASA: 3  Anesthesia Plan: General ETT   Post-op Pain Management: Regional block*   Induction: Intravenous  PONV Risk Score and Plan: Ondansetron, Dexamethasone, Midazolam and Treatment may vary due to age or medical condition  Airway Management Planned: Oral ETT  Additional Equipment:   Intra-op Plan:   Post-operative Plan: Extubation in OR  Informed Consent: I have reviewed the patients History and Physical, chart, labs and discussed the procedure including the risks, benefits and alternatives for the proposed anesthesia with the patient or authorized representative who has indicated his/her understanding and acceptance.     Dental Advisory Given  Plan Discussed with: Anesthesiologist, CRNA and Surgeon  Anesthesia Plan Comments: (Patient consented for risks of anesthesia including but not limited to:  - adverse reactions to medications - damage to eyes, teeth, lips  or other oral mucosa - nerve damage due to positioning  - sore throat or hoarseness - Damage to heart, brain, nerves, lungs, other parts of body or loss of life  Patient voiced understanding and assent.)       Anesthesia Quick Evaluation

## 2023-01-03 NOTE — Op Note (Signed)
Date of Surgery: 01/03/2023  INDICATIONS: Shelby Henson is a 52 y.o.-year-old female with right shoulder rotator cuff tear.  The risk and benefits of the procedure were discussed in detail and documented in the pre-operative evaluation.   PREOPERATIVE DIAGNOSES: Right shoulder, chronic rotator cuff tear and subacromial impingement.  POSTOPERATIVE DIAGNOSIS: Same.  PROCEDURE: Arthroscopic extensive debridement - 29823 Subdeltoid Bursa, Supraspinatus Tendon, and Anterior Labrum Arthroscopic subacromial decompression - 16109 Arthroscopic rotator cuff repair - 60454  SURGEON: Benancio Deeds MD  ASSISTANT: Kerby Less, ATC  ANESTHESIA:  general plus interscalene nerve block  IV FLUIDS AND URINE: See anesthesia record.  ANTIBIOTICS: Ancef  ESTIMATED BLOOD LOSS: 5 mL.  IMPLANTS:  Implant Name Type Inv. Item Serial No. Manufacturer Lot No. LRB No. Used Action  ANCHOR JUGGERKNOT SOFT S-P - [id number] Anchor ANCHOR JUGGERKNOT SOFT S-P  ZIMMER RECON(ORTH,TRAU,BIO,SG) [id number] Right 1 Implanted  ANCHOR SUT QUATTRO KNTLS 4.5 - [id number] Anchor ANCHOR SUT QUATTRO KNTLS 4.5  ZIMMER RECON(ORTH,TRAU,BIO,SG) [id number] Right 1 Implanted  [facility] DUAL INSERTER - [id number] Anchor ANCHOR DUAL INSERTER  ZIMMER RECON(ORTH,TRAU,BIO,SG) [id number] Right 1 Implanted  ANCHOR DUAL INSERTER - [id number] Anchor ANCHOR DUAL INSERTER  ZIMMER RECON(ORTH,TRAU,BIO,SG) [id number] Right 1 Implanted  TAPESTRY RC STS [id number] - [id number] Orthopedic Implant TAPESTRY RC STS [id number]  ZIMMER RECON(ORTH,TRAU,BIO,SG) [id number] Right 1 Implanted    DRAINS: None  CULTURES: None  COMPLICATIONS: none  PROCEDURE:    OPERATIVE FINDING: Exam under anesthesia:   Examination under anesthesia revealed forward elevation of 150 degrees.  With the arm at the side, there was 65 degrees of external rotation.  There is a 1+ anterior load shift and a 1+ posterior load shift.    Arthroscopic findings demonstrated: Articular space: Grade 2  chondral fraying head and glenoid, anterior superior labral fraying Biceps: Normal Subscapularis: Intact Supraspinatus: Full-thickness tear Infraspinatus: Intact    I identified the patient in the pre-operative holding area.  I marked the operative right shoulder with my initials. I reviewed the risks and benefits of the proposed surgical intervention and the patient wished to proceed.  Anesthesia was then performed with regional block.  The patient was transferred to the operative suite and placed in the beach chair position with all bony prominences padded.     SCDs were placed on bilateral lower extremity. Appropriate antibiotics was administered within 1 hour before incision.  Anesthesia was induced.  The operative extremity was then prepped and draped in standard fashion. A time out was performed confirming the correct extremity, correct patient and correct procedure.   The arthroscope was introduced in the glenohumeral joint from a posterior portal.  An anterior portal was created.  The shoulder was examined and the above findings were noted.     With an arthroscopic shaver and a wand ablator, synovitis throughout the  shoulder was resected.  The arthroscopic shaver was used to excise torn portions of the labrum back to a stable margin. Specifically this was done for the anterior superior labrum.    Through visualization from intra-articular, the footprint of the rotator cuff was debrided of soft tissues back to bleeding bone.  This was done with an arthroscopic shaver.     The rotator cuff was then approached through the subacromial space. Anterior, lateral, and posterior portals were used.  Bursectomy was performed with an arthroscopic shaver and ArthroCare wand.  The soft tissues on the undersurface of the acromion and clavicle were resected with the arthroscopic shaver and wand. There was good excursion that was noted  of the tendon back to its footprint which would be amendable to an  repair.   The rotator cuff was repaired with a double row configuration with 1 medial row all suture suture anchors as noted above with sutures passed from anterior to posterior in horizontal fashion with a self passing suture device.  A total of 4 limbs of suture were passed.  The sutures were placed with a single lateral row anchor.  This provided excellent anatomic footprint approximation.  The quality of the tissue at this time this was augmented with a collagen patch.  This was introduced in the anterior portal and the lateral portal was used to place 4 staples in the corners of this.  This had excellent opposition to the rotator cuff repair   The shoulder was irrigated.  The arthroscopic instruments were removed.  Wounds were closed with 3-0 nylon sutures.  A sterile dressing was applied with xeroform, 4x8s, abdominal pad, and tape. An Shelby Henson was placed and the upper extremity was placed in a shoulder immobilizer.  The patient tolerated the procedure well and was taken to the recovery room in stable condition.  All counts were correct in the case. The patient tolerated the procedure well and was taken to the recovery room in stable condition.    POSTOPERATIVE PLAN: Will follow the rotator cuff repair protocol.  She will be placed on 2 weeks of aspirin's.  She will be seen 2 weeks postop  Benancio Deeds, MD 1:35 PM

## 2023-01-03 NOTE — Anesthesia Procedure Notes (Signed)
Procedure Name: Intubation Date/Time: 01/03/2023 11:47 AM  Performed by: Jaye Beagle, CRNAPre-anesthesia Checklist: Patient identified, Emergency Drugs available, Suction available and Patient being monitored Patient Re-evaluated:Patient Re-evaluated prior to induction Oxygen Delivery Method: Circle system utilized Preoxygenation: Pre-oxygenation with 100% oxygen Induction Type: IV induction Ventilation: Mask ventilation without difficulty Laryngoscope Size: McGrath and 3 Grade View: Grade I Tube type: Oral Number of attempts: 1 Airway Equipment and Method: Stylet and Oral airway Placement Confirmation: ETT inserted through vocal cords under direct vision, positive ETCO2 and breath sounds checked- equal and bilateral Secured at: 19 cm Tube secured with: Tape Dental Injury: Teeth and Oropharynx as per pre-operative assessment

## 2023-01-03 NOTE — Discharge Instructions (Addendum)
Interscalene Nerve Block with Exparel   For your surgery you have received an Interscalene Nerve Block with Exparel. Nerve Blocks affect many types of nerves, including nerves that control movement, pain and normal sensation.  You may experience feelings such as numbness, tingling, heaviness, weakness or the inability to move your arm or the feeling or sensation that your arm has "fallen asleep". A nerve block with Exparel can last up to 5 days.  Usually the weakness wears off first.  The tingling and heaviness usually wear off next.  Finally you may start to notice pain.  Keep in mind that this may occur in any order.  Once a nerve block starts to wear off it is usually completely gone within 60 minutes. ISNB may cause mild shortness of breath, a hoarse voice, blurry vision, unequal pupils, or drooping of the face on the same side as the nerve block.  These symptoms will usually resolve with the numbness.  Very rarely the procedure itself can cause mild seizures. If needed, your surgeon will give you a prescription for pain medication.  It will take about 60 minutes for the oral pain medication to become fully effective.  So, it is recommended that you start taking this medication before the nerve block first begins to wear off, or when you first begin to feel discomfort. Take your pain medication only as prescribed.  Pain medication can cause sedation and decrease your breathing if you take more than you need for the level of pain that you have. Nausea is a common side effect of many pain medications.  You may want to eat something before taking your pain medicine to prevent nausea. After an Interscalene nerve block, you cannot feel pain, pressure or extremes in temperature in the effected arm.  Because your arm is numb it is at an increased risk for injury.  To decrease the possibility of injury, please practice the following:  POLAR CARE INFORMATION  MassAdvertisement.it  How to use [employer] Polar Care  [facility] Therapy System?  YouTube   ShippingScam.co.uk  OPERATING INSTRUCTIONS  Start the product With dry hands, connect the transformer to the electrical connection located on the top of the cooler. Next, plug the transformer into an appropriate electrical outlet. The unit will automatically start running at this point.  To stop the pump, disconnect electrical power.  Unplug to stop the product when not in use. Unplugging the Polar Care unit turns it off. Always unplug immediately after use. Never leave it plugged in while unattended. Remove pad.    FIRST ADD WATER TO FILL LINE, THEN ICE---Replace ice when existing ice is almost melted  1 Discuss Treatment with your Licensed Health Care Practitioner and Use Only as Prescribed 2 Apply Insulation Barrier & Cold Therapy Pad 3 Check for Moisture 4 Inspect Skin Regularly  Tips and Trouble Shooting Usage Tips 1. Use cubed or chunked ice for optimal performance. 2. It is recommended to drain the Pad between uses. To drain the pad, hold the Pad upright with the hose pointed toward the ground. Depress the black plunger and allow water to drain out. 3. You may disconnect the Pad from the unit without removing the pad from the affected area by depressing the silver tabs on the hose coupling and gently pulling the hoses apart. The Pad and unit will seal itself and will not leak. Note: Some dripping during release is normal. 4. DO NOT RUN PUMP WITHOUT WATER! The pump in this unit is designed to run  with water. Running the unit without water will cause permanent damage to the pump. 5. Unplug unit before removing lid.  TROUBLESHOOTING GUIDE Pump not running, Water not flowing to the pad, Pad is not getting cold 1. Make sure the transformer is plugged into the wall outlet. 2. Confirm that the ice and water are filled to the indicated levels. 3. Make sure there are no kinks in the pad. 4. Gently pull on the blue tube to  make sure the tube/pad junction is straight. 5. Remove the pad from the treatment site and ll it while the pad is lying at; then reapply. 6. Confirm that the pad couplings are securely attached to the unit. Listen for the double clicks (Figure 1) to confirm the pad couplings are securely attached.  Leaks    Note: Some condensation on the lines, controller, and pads is unavoidable, especially in warmer climates. 1. If using a [employer] Polar Care Cold Therapy unit with a detachable Cold Therapy Pad, and a leak exists (other than condensation on the lines) disconnect the pad couplings. Make sure the silver tabs on the couplings are depressed before reconnecting the pad to the pump hose; then confirm both sides of the coupling are properly clicked in. 2. If the coupling continues to leak or a leak is detected in the pad itself, stop using it and call [employer] Customer Care at [phone].  Cleaning After use, empty and dry the unit with a soft cloth. Warm water and mild detergent may be used occasionally to clean the pump and tubes.  WARNING: The Polar Care Cube can be cold enough to cause serious injury, including full skin necrosis. Follow these Operating Instructions, and carefully read the Product Insert (see pouch on side of unit) and the Cold Therapy Pad Fitting Instructions (provided with each Cold Therapy Pad) prior to use.       SHOULDER SLING IMMOBILIZER   VIDEO Slingshot 2 Shoulder Brace Application - YouTube ---https://www.porter.info/  INSTRUCTIONS While supporting the injured arm, slide the forearm into the sling. Wrap the adjustable shoulder strap around the neck and shoulders and attach the strap end to the sling using  the "alligator strap tab."  Adjust the shoulder strap to the required length. Position the shoulder pad behind the neck. To secure the shoulder pad location (optional), pull the shoulder strap away from the shoulder pad, unfold the hook material on  the top of the pad, then press the shoulder strap back onto the hook material to secure the pad in place. Attach the closure strap across the open top of the sling. Position the strap so that it holds the arm securely in the sling. Next, attach the thumb strap to the open end of the sling between the thumb and fingers. After sling has been fit, it may be easily removed and reapplied using the quick release buckle on shoulder strap. If a neutral pillow or 15 abduction pillow is included, place the pillow at the waistline. Attach the sling to the pillow, lining up hook material on the pillow with the loop on sling. Adjust the waist strap to fit.  If waist strap is too long, cut it to fit. Use the small piece of double sided hook material (located on top of the pillow) to secure the strap end. Place the double sided hook material on the inside of the cut strap end and secure it to the waist strap.     If no pillow is included, attach the waist strap to  the sling and adjust to fit.    Washing Instructions: Straps and sling must be removed and cleaned regularly depending on your activity level and perspiration. Hand wash straps and sling in cold water with mild detergent, rinse, air dry            Discharge Instructions    Attending Surgeon: Huel Cote, MD Office Phone Number: [phone]   Diagnosis and Procedures:    Surgeries Performed: Right shoulder rotator cuff repair  Discharge Plan:    Diet: Resume usual diet. Begin with light or bland foods.  Drink plenty of fluids.  Activity:  Keep sling and dressing in place until your follow up visit in Physical Therapy You are advised to go home directly from the hospital or surgical center. Restrict your activities.  GENERAL INSTRUCTIONS: 1.  Keep your surgical site elevated above your heart for at least 5-7 days or longer to prevent swelling. This will improve your comfort and your overall recovery following surgery.     2.  Please call Dr. Serena Croissant office at [phone] with questions Monday-Friday during business hours. If no one answers, please leave a message and someone should get back to the patient within 24 hours. For emergencies please call 911 or proceed to the emergency room.   3. Patient to notify surgical team if experiences any of the following: Bowel/Bladder dysfunction, uncontrolled pain, nerve/muscle weakness, incision with increased drainage or redness, nausea/vomiting and Fever greater than 101.0 F.  Be alert for signs of infection including redness, streaking, odor, fever or chills. Be alert for excessive pain or bleeding and notify your surgeon immediately.  WOUND INSTRUCTIONS:   Leave your dressing/cast/splint in place until your post operative visit.  Keep it clean and dry.  Always keep the incision clean and dry until the staples/sutures are removed. If there is no drainage from the incision you should keep it open to air. If there is drainage from the incision you must keep it covered at all times until the drainage stops  Do not soak in a bath tub, hot tub, pool, lake or other body of water until 21 days after your surgery and your incision is completely dry and healed.  If you have removable sutures (or staples) they must be removed 10-14 days (unless otherwise instructed) from the day of your surgery.     1)  Elevate the extremity as much as possible.  2)  Keep the dressing clean and dry.  3)  Please call us if the dressing becomes wet or dirty.  4)  If you are experiencing worsening pain or worsening swelling, please call.     MEDICATIONS: Resume all previous home medications at the previous prescribed dose and frequency unless otherwise noted Start taking the  pain medications on an as-needed basis as prescribed  Please taper down pain medication over the next week following surgery.  Ideally you should not require a refill of any narcotic pain medication.  Take pain medication  with food to minimize nausea. In addition to the prescribed pain medication, you may take over-the-counter pain relievers such as Tylenol.  Do NOT take additional tylenol if your pain medication already has tylenol in it.  Aspirin 325mg  daily per bottle instructions. Narcotic Policy: Per [facility] clinic policy, our goal is ensure optimal postoperative pain control with a multimodal pain management strategy. For all [facility] patients, our goal is to wean post-operative narcotic medications by 6 weeks post-operatively, and many times sooner. If this is not possible due  to utilization of pain medication prior to surgery, your [facility] doctor will support your acute post-operative pain control for the first 6 weeks postoperatively, with a plan to transition you back to your primary pain team following that. Cyndia Skeeters will work to ensure a Therapist, occupational.      FOLLOWUP INSTRUCTIONS: 1. Follow up at the Physical Therapy Clinic 3-4 days following surgery. This appointment should be scheduled unless other arrangements have been made.The Physical Therapy scheduling number is [phone] if an appointment has not already been arranged.  2. Contact Dr. Serena Croissant office during office hours at [phone] or the practice after hours line at [phone] for non-emergencies. For medical emergencies call 911.   Discharge Location: Home

## 2023-01-03 NOTE — H&P (Signed)
Chief Complaint: Right shoulder pain        History of Present Illness:      Shelby Henson is a 52 y.o. female right-hand-dominant female presents with ongoing right shoulder pain that has been going on for last several years.  She has now had 2 injections which did not provide significant relief.  She has been working physical therapy and strengthening of the shoulder in addition to dry needling none of which are helpful.  She has been taking anti-inflammatories.  She is having persistent pain with any type of overhead activities.  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  She is having a difficult time laying directly on the side.  She is no longer to participate in working out and lifting overhead as result of this       Surgical History:   None   PMH/PSH/Family History/Social History/Meds/Allergies:         Past Medical History:  Diagnosis Date   Anemia     Asthma      well controlled   Complication of anesthesia     Depression     GERD (gastroesophageal reflux disease)      occ   Migraine     Morbid obesity (HCC)     Osteoarthritis     PONV (postoperative nausea and vomiting)      with c-section x 1             Past Surgical History:  Procedure Laterality Date   ABLATION       BREAST CYST ASPIRATION Left     CESAREAN SECTION       CESAREAN SECTION       CESAREAN SECTION       COLONOSCOPY N/A 09/22/2020    Procedure: COLONOSCOPY;  Surgeon: Pasty Spillers, MD;  Location: [facility];  Service: Endoscopy;  Laterality: N/A;   HIP SURGERY   10/2018   LAPAROSCOPY   2018   LEEP   2005    benign pathology; Dr. Luella Cook   SUPRACERVICAL ABDOMINAL HYSTERECTOMY   2010    leio/AUB; Dr. Luella Cook   TOTAL HIP ARTHROPLASTY Right 11/12/2018    Procedure: TOTAL HIP ARTHROPLASTY ANTERIOR APPROACH;  Surgeon: Lyndle Herrlich, MD;  Location: [facility];  Service:  Orthopedics;  Laterality: Right;        Social History         Socioeconomic History   Marital status: Divorced      Spouse name: Not on file   Number of children: 3   Years of education: Not on file   Highest education level: Associate degree: academic program  Occupational History   Not on file  Tobacco Use   Smoking status: Never   Smokeless tobacco: Never  Vaping Use   Vaping status: Never Used  Substance and Sexual Activity   Alcohol use: Yes      Alcohol/week: 0.0 standard drinks of alcohol      Comment: occasionally   Drug use: No   Sexual activity: Yes      Birth control/protection: Surgical      Comment: Hysterectomy  Other Topics Concern   Not on file  Social History Narrative   Not on file    Social Determinants of Health        Financial Resource Strain: Medium Risk (05/07/2022)    Overall Financial  Resource Strain (CARDIA)     Difficulty of Paying Living Expenses: Somewhat hard  Food Insecurity: Food Insecurity Present (05/07/2022)    Hunger Vital Sign     Worried About Running Out of Food in the Last Year: Sometimes true     Ran Out of Food in the Last Year: Sometimes true  Transportation Needs: No Transportation Needs (05/07/2022)    PRAPARE - Therapist, art (Medical): No     Lack of Transportation (Non-Medical): No  Physical Activity: Sufficiently Active (05/07/2022)    Exercise Vital Sign     Days of Exercise per Week: 2 days     Minutes of Exercise per Session: 90 min  Stress: Stress Concern Present (05/07/2022)    [employer] of Occupational Health - Occupational Stress Questionnaire     Feeling of Stress : Rather much  Social Connections: Moderately Isolated (05/07/2022)    Social Connection and Isolation Panel [NHANES]     Frequency of Communication with Friends and Family: More than three times a week     Frequency of Social Gatherings with Friends and Family: Three times a week     Attends Religious Services: 1 to 4  times per year     Active Member of Clubs or Organizations: No     Attends Engineer, structural: Not on file     Marital Status: Divorced         Family History  Problem Relation Age of Onset   Depression Mother     Bipolar disorder Brother     Colon cancer Maternal Grandfather          not sure of age   Breast cancer Neg Hx          Allergies       Allergies  Allergen Reactions   Celecoxib Hives   Prednisone        intolerance, makes her angry. Makes her angry   Topamax [Topiramate]        Mood changes   Zolpidem     Codeine Rash   Penicillins Rash      Did it involve swelling of the face/tongue/throat, SOB, or low BP? No Did it involve sudden or severe rash/hives, skin peeling, or any reaction on the inside of your mouth or nose? Yes Did you need to seek medical attention at a hospital or doctor's office? Yes When did it last happen? More than 20 years ago       If all above answers are "NO", may proceed with cephalosporin use.              Current Outpatient Medications  Medication Sig Dispense Refill   albuterol (PROVENTIL) (2.5 MG/3ML) 0.083% nebulizer solution Take 3 mLs (2.5 mg total) by nebulization every 6 (six) hours as needed for wheezing or shortness of breath 75 mL 1   Albuterol-Budesonide (AIRSUPRA) 90-80 MCG/ACT AERO Inhale 2 puffs into the lungs 4 (four) times daily as needed. 10.7 g 1   desvenlafaxine (PRISTIQ) 50 MG 24 hr tablet Take 1 tablet (50 mg total) by mouth daily. 90 tablet 1   diclofenac Sodium (VOLTAREN) 1 % GEL Apply 2 grams topically 4 (four) times daily. 100 g 1   famotidine (PEPCID) 40 MG tablet Take 1 tablet (40 mg total) by mouth daily. 90 tablet 1   Fluticasone-Umeclidin-Vilant (TRELEGY ELLIPTA) 200-62.5-25 MCG/ACT AEPB Inhale 1 puff into the lungs daily. 60 each 6   gabapentin (NEURONTIN) 300 MG  capsule Take 3 capsules (900 mg total) by mouth at bedtime. 270 capsule 1   Galcanezumab-gnlm (EMGALITY) 120 MG/ML SOAJ Inject 1 mL  into the skin every 30 (thirty) days. 1 mL 5   lidocaine (LIDODERM) 5 % Place 2 patches onto the skin daily. Remove & Discard patch within 12 hours or as directed by MD 60 patch 2   methocarbamol (ROBAXIN) 500 MG tablet Take 1 tablet (500 mg total) by mouth 4 (four) times daily. 40 tablet 0   montelukast (SINGULAIR) 10 MG tablet Take 1 tablet (10 mg total) by mouth at bedtime. 90 tablet 1   pantoprazole (PROTONIX) 40 MG tablet Take 1 tablet (40 mg total) by mouth daily. 30 tablet 1   prazosin (MINIPRESS) 1 MG capsule Take 1 capsule (1 mg total) by mouth at bedtime. 90 capsule 1      No current facility-administered medications for this visit.      Imaging Results (Last 48 hours)  No results found.     Review of Systems:   A ROS was performed including pertinent positives and negatives as documented in the HPI.   Physical Exam :   Constitutional: NAD and appears stated age Neurological: Alert and oriented Psych: Appropriate affect and cooperative There were no vitals taken for this visit.    Comprehensive Musculoskeletal Exam:     Musculoskeletal Exam      Inspection Right Left  Skin No atrophy or winging No atrophy or winging  Palpation      Tenderness Greater tuberosity, biceps None  Range of Motion      Flexion (passive) 170 170  Flexion (active) 170 170  Abduction 170 170  ER at the side 70 70  Can reach behind back to T12 T12  Strength        4 out of 5, negative belly press Full  Special Tests      Pseudoparalytic No No  Neurologic      Fires PIN, radial, median, ulnar, musculocutaneous, axillary, suprascapular, long thoracic, and spinal accessory innervated muscles. No abnormal sensibility  Vascular/Lymphatic      Radial Pulse 2+ 2+  Cervical Exam      Patient has symmetric cervical range of motion with negative Spurling's test.  Special Test: Positive speeds maneuver        Imaging:   Xray (3 views right shoulder): Normal   MRI (right shoulder): Nearly  full-thickness articular sided rotator cuff tear of the supraspinatus as well as intrasubstance biceps tearing with good muscle belly on the T1 sagittal view   I personally reviewed and interpreted the radiographs.     Assessment:   52 y.o. female right dominant female with right shoulder pain in the setting of a nearly full-thickness right rotator cuff tear of the supraspinatus.  I did describe that she also has some biceps tearing and at this time she has had multiple forms of conservative management including 2 injections, physical therapy, activity restriction, NSAID medication none of which have given her prolonged relief.  Given this we did discuss the possibility of arthroscopic intervention including rotator cuff repair with biceps tenodesis.  I did discuss the risks and limitations associated with this.  After discussion she has elected for this.   Plan :     -Plan for right shoulder arthroscopy with rotator cuff repair, acromioplasty, biceps tenodesis     After a lengthy discussion of treatment options, including risks, benefits, alternatives, complications of surgical and nonsurgical conservative options, the patient elected  surgical repair.    The patient  is aware of the material risks  and complications including, but not limited to injury to adjacent structures, neurovascular injury, infection, numbness, bleeding, implant failure, thermal burns, stiffness, persistent pain, failure to heal, disease transmission from allograft, need for further surgery, dislocation, anesthetic risks, blood clots, risks of death,and others. The probabilities of surgical success and failure discussed with patient given their particular co-morbidities.The time and nature of expected rehabilitation and recovery was discussed.The patient's questions were all answered preoperatively.  No barriers to understanding were noted. I explained the natural history of the disease process and Rx rationale.  I explained  to the patient what I considered to be reasonable expectations given their personal situation.  The final treatment plan was arrived at through a shared patient decision making process model.           I personally saw and evaluated the patient, and participated in the management and treatment plan.   Huel Cote, MD Attending Physician, Orthopedic Surgery   This document was dictated using Dragon voice recognition software. A reasonable attempt at proof reading has been made to minimize errors.

## 2023-01-03 NOTE — Brief Op Note (Signed)
   Brief Op Note  Date of Surgery: 01/03/2023  Preoperative Diagnosis: RIGHT SHOULDER ROTATOR CUFF TEAR  Postoperative Diagnosis: same  Procedure: Procedure(s): RIGHT SHOULDER ARTHROSCOPY WITH ROTATOR CUFF REPAIR AND  BICEPS TENODESIS RIGHT SHOULDER ACROMIOPLASTY  Implants: Implant Name Type Inv. Item Serial No. Manufacturer Lot No. LRB No. Used Action  ANCHOR JUGGERKNOT SOFT S-P - [id number] Anchor ANCHOR JUGGERKNOT SOFT S-P  [employer]IMMER [employer]ON(ORTH,TRAU,BIO,SG) [id number] Right 1 Implanted  ANCHOR SUT QUATTRO KNTLS 4.5 - [id number] Anchor ANCHOR SUT QUATTRO KNTLS 4.5  ZIMMER RECON(ORTH,TRAU,BIO,SG) [id number] Right 1 Implanted  [facility] DUAL INSERTER - [id number] Anchor ANCHOR DUAL INSERTER  ZIMMER RECON(ORTH,TRAU,BIO,SG) [id number] Right 1 Implanted  ANCHOR DUAL INSERTER - [id number] Anchor ANCHOR DUAL INSERTER  ZIMMER [employer]ON(ORTH,TRAU,BIO,SG) [id number] Right 1 Implanted  TAPESTRY RC STS [id number] - [id number] Orthopedic Implant TAPESTRY RC STS [id number]  ZIMMER RECON(ORTH,TRAU,BIO,SG) [id number] Right 1 Implanted    Surgeons: Surgeon(s): Huel Cote, MD  Anesthesia: Choice    Estimated Blood Loss: See anesthesia record  Complications: None  Condition to PACU: Stable  Benancio Deeds, MD 01/03/2023 1:34 PM

## 2023-01-03 NOTE — Interval H&P Note (Signed)
History and Physical Interval Note:  01/03/2023 10:33 AM  Shelby Henson  has presented today for surgery, with the diagnosis of RIGHT SHOULDER ROTATOR CUFF TEAR.  The various methods of treatment have been discussed with the patient and family. After consideration of risks, benefits and other options for treatment, the patient has consented to  Procedure(s): RIGHT SHOULDER ARTHROSCOPY WITH ROTATOR CUFF REPAIR AND  BICEPS TENODESIS (Right) RIGHT SHOULDER ACROMIOPLASTY (Right) as a surgical intervention.  The patient's history has been reviewed, patient examined, no change in status, stable for surgery.  I have reviewed the patient's chart and labs.  Questions were answered to the patient's satisfaction.     Huel Cote

## 2023-01-03 NOTE — Anesthesia Procedure Notes (Signed)
Anesthesia Regional Block: Interscalene brachial plexus block   Pre-Anesthetic Checklist: , timeout performed,  Correct Patient, Correct Site, Correct Laterality,  Correct Procedure, Correct Position, site marked,  Risks and benefits discussed,  Surgical consent,  Pre-op evaluation,  At surgeon's request and post-op pain management  Laterality: Upper and Left  Prep: chloraprep       Needles:  Injection technique: Single-shot  Needle Type: Stimiplex     Needle Length: 9cm  Needle Gauge: 22     Additional Needles:   Procedures:,,,, ultrasound used (permanent image in chart),,    Narrative:  Start time: 01/03/2023 11:03 AM End time: 01/03/2023 11:04 AM Injection made incrementally with aspirations every 5 mL.  Performed by: Personally   Additional Notes: Patient consented for risk and benefits of nerve block including but not limited to nerve damage, Horner's syndrome, failed block, bleeding and infection.  Patient voiced understanding.  Functioning IV was confirmed and monitors were applied.  Timeout done prior to procedure and prior to any sedation being given to the patient.  Patient confirmed procedure site prior to any sedation given to the patient.  A 50mm 22ga Stimuplex needle was used. Sterile prep,hand hygiene and sterile gloves were used.  Minimal sedation used for procedure.  No paresthesia endorsed by patient during the procedure.  Negative aspiration and negative test dose prior to incremental administration of local anesthetic. The patient tolerated the procedure well with no immediate complications.

## 2023-01-03 NOTE — Anesthesia Postprocedure Evaluation (Signed)
Anesthesia Post Note  Patient: RAYSHAWNA ALTES  Procedure(s) Performed: RIGHT SHOULDER ARTHROSCOPY WITH ROTATOR CUFF REPAIR AND  BICEPS TENODESIS (Right) RIGHT SHOULDER ACROMIOPLASTY (Right: Shoulder)  Patient location during evaluation: PACU Anesthesia Type: General Level of consciousness: awake and alert Pain management: pain level controlled Vital Signs Assessment: post-procedure vital signs reviewed and stable Respiratory status: spontaneous breathing, nonlabored ventilation, respiratory function stable and patient connected to nasal cannula oxygen Cardiovascular status: blood pressure returned to baseline and stable Postop Assessment: no apparent nausea or vomiting Anesthetic complications: no   No notable events documented.   Last Vitals:  Vitals:   01/03/23 1400 01/03/23 1403  BP: (!) 145/74   Pulse: 72 76  Resp: 14 17  Temp:  (!) 36.3 C  SpO2: 90% 94%    Last Pain:  Vitals:   01/03/23 1403  TempSrc:   PainSc: 3                  Cleda Mccreedy Brynda Heick

## 2023-01-03 NOTE — Transfer of Care (Signed)
Immediate Anesthesia Transfer of Care Note  Patient: SANGITA KUBIAK  Procedure(s) Performed: RIGHT SHOULDER ARTHROSCOPY WITH ROTATOR CUFF REPAIR AND  BICEPS TENODESIS (Right) RIGHT SHOULDER ACROMIOPLASTY (Right: Shoulder)  Patient Location: PACU  Anesthesia Type:General  Level of Consciousness: drowsy  Airway & Oxygen Therapy: Patient Spontanous Breathing and Patient connected to face mask oxygen  Post-op Assessment: Report given to RN  Post vital signs: stable  Last Vitals:  Vitals Value Taken Time  BP 144/91 01/03/23 1306  Temp    Pulse 69 01/03/23 1307  Resp 17 01/03/23 1307  SpO2 99 % 01/03/23 1307  Vitals shown include unfiled device data.  Last Pain:  Vitals:   01/03/23 1035  TempSrc: Temporal  PainSc: 3          Complications: No notable events documented.

## 2023-01-04 ENCOUNTER — Encounter: Payer: Self-pay | Admitting: Orthopaedic Surgery

## 2023-01-07 ENCOUNTER — Ambulatory Visit: Payer: 59 | Attending: Orthopaedic Surgery

## 2023-01-07 DIAGNOSIS — M6281 Muscle weakness (generalized): Secondary | ICD-10-CM | POA: Diagnosis not present

## 2023-01-07 DIAGNOSIS — M25619 Stiffness of unspecified shoulder, not elsewhere classified: Secondary | ICD-10-CM | POA: Insufficient documentation

## 2023-01-07 DIAGNOSIS — M25611 Stiffness of right shoulder, not elsewhere classified: Secondary | ICD-10-CM | POA: Insufficient documentation

## 2023-01-07 DIAGNOSIS — M75111 Incomplete rotator cuff tear or rupture of right shoulder, not specified as traumatic: Secondary | ICD-10-CM | POA: Insufficient documentation

## 2023-01-07 DIAGNOSIS — M25511 Pain in right shoulder: Secondary | ICD-10-CM | POA: Insufficient documentation

## 2023-01-07 NOTE — Therapy (Signed)
OUTPATIENT PHYSICAL THERAPY EVALUATION   Patient Name: Shelby Henson MRN: [id number] DOB:1971/01/28, 52 y.o., female Today's Date: 01/07/2023  END OF SESSION:  PT End of Session - 01/07/23 1035     Visit Number 1    Number of Visits 24    Date for PT Re-Evaluation 04/07/23    Authorization Type [facility] [employer] FOC[employer]    Authorization Time Period 01/07/23-04/07/23    Progress Note Due on Visit 10    PT Start Time 0801    PT Stop Time 0845    PT Time Calculation (min) 44 min    Activity Tolerance Patient tolerated treatment well;No increased pain    Behavior During Therapy WFL for tasks assessed/performed             Past Medical History:  Diagnosis Date   Anemia    Asthma    well controlled   Colon polyps    Complication of anesthesia    DDD (degenerative disc disease), lumbar    Depression    GERD (gastroesophageal reflux disease)    occ   Iron deficiency anemia    Migraine    Morbid obesity (HCC)    Osteoarthritis    Osteoarthritis of right hip    PONV (postoperative nausea and vomiting)    with c-section x 1   Right rotator cuff tear 11/2022   Vitamin D deficiency    Past Surgical History:  Procedure Laterality Date   BREAST CYST ASPIRATION Left    CESAREAN SECTION  1990   CESAREAN SECTION  1993   CESAREAN SECTION  1996   COLONOSCOPY N/A 09/22/2020   Procedure: COLONOSCOPY;  Surgeon: Shelby Spillers, MD;  Location: [facility];  Service: Endoscopy;  Laterality: N/A;   LAPAROSCOPY  2018   LEEP  2005   benign pathology; Dr. Luella Henson   SHOULDER ACROMIOPLASTY Right 01/03/2023   Procedure: RIGHT SHOULDER ACROMIOPLASTY;  Surgeon: Shelby Cote, MD;  Location: [facility];  Service: Orthopedics;  Laterality: Right;   SHOULDER ARTHROSCOPY WITH ROTATOR CUFF REPAIR AND OPEN BICEPS TENODESIS Right 01/03/2023   Procedure: RIGHT SHOULDER ARTHROSCOPY WITH ROTATOR CUFF REPAIR AND  BICEPS TENODESIS;  Surgeon: Shelby Cote, MD;  Location: [facility];  Service:  Orthopedics;  Laterality: Right;   SUPRACERVICAL ABDOMINAL HYSTERECTOMY  2010   leio/AUB; Dr. Luella Henson   TOTAL HIP ARTHROPLASTY Right 11/12/2018   Procedure: TOTAL HIP ARTHROPLASTY ANTERIOR APPROACH;  Surgeon: Shelby Herrlich, MD;  Location: [facility];  Service: Orthopedics;  Laterality: Right;   Patient Active Problem List   Diagnosis Date Noted   Nontraumatic incomplete tear of right rotator cuff 01/03/2023   Right rotator cuff tendonitis 09/11/2022   Iron deficiency anemia due to dietary causes 08/10/2022   Asthma, moderate persistent, poorly-controlled 08/10/2022   Chronic right shoulder pain 05/11/2022   Morbid obesity (HCC) 01/05/2022   Rotator cuff impingement syndrome, right 09/29/2021   Biceps tendinitis, right 09/29/2021   Meralgia paresthetica of right side 03/29/2021   Psoas tendinitis, right hip 03/29/2021   History of total right hip arthroplasty 03/29/2021   Status post total replacement of right hip 02/19/2021   Meralgia paresthetica of right side 02/19/2021   Polyp of ascending colon 02/19/2021   Polyp of sigmoid colon 02/19/2021   Special screening for malignant neoplasms, colon    Polyp of ascending colon    Polyp of sigmoid colon    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 03/23/2020   Pain in right hip 01/12/2019  Vitamin D deficiency 02/25/2018   Chronic pain 10/24/2015   Hot flashes 10/24/2015   Migraine without aura and without status migrainosus, not intractable 10/24/2015   Metabolic syndrome 10/24/2015   Dyslipidemia 10/24/2015   Allergic rhinitis, seasonal 05/17/2015   Asthma in adult, mild intermittent, with acute exacerbation 08/08/2014   Chronic insomnia 08/08/2014   DDD (degenerative disc disease), lumbar 08/08/2014   Mood disorder (HCC) 08/08/2014   Gastroesophageal reflux disease without esophagitis 08/08/2014   H/O total hysterectomy 08/08/2014   Irritable bowel syndrome with diarrhea 08/08/2014   Osteoarthritis of  right hip 08/08/2014   Adult BMI 30+ 08/08/2014   SBO (spina bifida occulta) 08/08/2014    PCP: Shelby Cory, MD   REFERRING PROVIDER: Huel Cote, MD orthopedics   REFERRING DIAG: Rt arthroscopic rotator cuff repair   THERAPY DIAG:  Right shoulder pain, unspecified chronicity  Stiffness of right shoulder, not elsewhere classified  Rationale for Evaluation and Treatment: Rehabilitation  ONSET DATE: 01/03/23  SUBJECTIVE:                                                                                                                                                                                      SUBJECTIVE STATEMENT: Pt reports having Surgery Thursday on her shoulder, DC to her DTR's house. Pt has needed help with sling from DTR. Pt has been using [facility] with relief. Pt has been able ot achieve her pain goals with exception to overnight last 2 nights, wherein pain got to 8/10. Pt report diurnal use OTC tylenol and ibuprofen, noctural use Rx oxy. Pt will transition back to her home soon with intermittent assist from DTR. Pt did not see PT prior to leaving [facility], has not been doing any formal HEP since surgery. Pt is familiar to this clinic in a variety of ways.   Hand dominance: Righty   PERTINENT HISTORY: Shelby Henson is a 52yo F presents to outpatient physical therapy on postop day 4 status post right arthroscopic shoulder debridement of supraspinatus tendon, anterior glenoid labrum, subacromial depression, rotator cuff repair with Dr. Huel Henson.  Patient reports more than 1 year of insidious onset shoulder pain previously followed by sports medicine Dr. Ashley Henson.  PAIN:  Are you having pain? 3/10 right shoulder: Worst pain was 8 out of 10 while sleeping overnight 2 nights ago.  PRECAUTIONS: Patient in sling immobilizer and nonweightbearing x 2 weeks.  Awaiting updates pending follow-up visit with orthopedics.   WEIGHT BEARING RESTRICTIONS: Yes, right upper extremity  nonweightbearing  FALLS:  Has patient fallen in last 6 months?  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 PATIENT SURVEYS:  FOTO: 4  SENSATION: WNL  POSTURE: WNL  UPPER EXTREMITY ROM:    ROM Right eval Left eval  Shoulder flexion 90   Shoulder extension NT   Shoulder abduction NT   Shoulder internal rotation full   Shoulder external rotation NT   Elbow flexion full   Elbow extension full    Wrist flexion full    Wrist extension full    Wrist ulnar deviation full    Wrist radial deviation full    forearm pronation full    forearm supination full    (Blank rows = not tested)  PALPATION:  Tenderness at the posterior shoulder portal wound while adjusting/donning sling   TODAY'S TREATMENT:                                                                                                                                         DATE: 01/07/23  -self care education, activity modification -sling/immobilizer adjustment and fitting -tips on learning independent sling don/doffing -HEP activities and self modification  1x10 of the following RUE -gross finger flexion x3secH, gross finger extension x3secH  -CCW wrist circle, CW wrist circle -forearm supination and pronation  -standing elbow AA/ROM flexion extension (in full supination)  -standing Rt GHJ pendulum 1x15sec -seated Rt GHJ pendulum 5x5sec, then 2x45sec  -isometric Rt shoulder adduction into towel roll 10x3secH (no rotation, elbow supported)   *education on 90 degrees limitation for shoulder flexion      PATIENT EDUCATION: Education details: See above- extensive education this visit  Person educated: patient   Education method: Research scientist (medical), deliberate practice, positive reinforcement, explicit instruction, establish rules,  Education comprehension: adequate   HOME EXERCISE PROGRAM: Access Code: [email or web address] URL: [email or web address] Date: 01/07/2023 Prepared by: Alvera Novel  Exercises - Seated Cervical Sidebending Stretch  - 1-2 x daily - 3 sets - 30 hold - Seated Assisted Cervical Rotation with Towel  - 1-2 x daily - 3 sets - 30 hold - Sidelying Thoracic Rotation Book Openings- Both sides  - 1 x daily - 1-2 x weekly - 1 sets - 20 reps - 1-2sec hold - Supine Cervical Retraction with Towel  - 2 x daily - 7 x weekly - 1 sets - 15 reps - 3-5sec hold - Shoulder external rotation, scapular retraction RED band  - 1 x daily - 3 x weekly - 3 sets - 15 reps - Standing Bilateral Low Shoulder Row with Anchored Resistance  - 1 x daily - 7 x weekly - 3 sets - 15 reps  ASSESSMENT:  CLINICAL IMPRESSION: 52yoF s/p Rt shoulder repair, now following postoperative protocol, immobilization. As indicated by surgeon pt will benefit from skilled PT services to fully rehabilitate  this operative shoulder to allow for full return to PLOF in UE use for ADL, IADL, and fitness/leisure.   OBJECTIVE IMPAIRMENTS: decreased activity tolerance, decreased endurance, decreased knowledge of condition, decreased ROM, decreased strength, increased muscle spasms, impaired flexibility, and improper body mechanics.   ACTIVITY LIMITATIONS: carrying, lifting, bathing, toileting, dressing, self feeding, reach over head, and hygiene/grooming  PARTICIPATION LIMITATIONS: meal prep, cleaning, laundry, driving, shopping, community activity, occupation, and yard work  PERSONAL FACTORS: Age, Behavior pattern, Education, Fitness, Past/current experiences, Profession, Social background, Time since onset of injury/illness/exacerbation, and Transportation are also affecting patient's functional outcome.   REHAB POTENTIAL:  Excellent  CLINICAL DECISION MAKING: Stable/uncomplicated  EVALUATION COMPLEXITY: Low   GOALS: Goals reviewed with patient? No  SHORT TERM GOALS: Target date: 02/07/23  Pt to demonstrate Rt shoulder P/ROM flexion to >120 degrees, ABDCT >90 degrees, ER >45 degrees to improve ability to perform ADL/selfcare.  Baseline: Goal status: INITIAL  2.  Pt to report successful HEP performance and successful adherence to postoperative restrictions.  Baseline:  Goal status: INITIAL  3.  Pt to demonstrate 3/5 Rt shoulder MMT to 90 degrees of flexion and 90 degrees of abduction.  Baseline: unable Goal status: INITIAL  LONG TERM GOALS: Target date: 04/07/23  Pt to improve FOTO survey goal by >60 points to indicate a monstrous reduction in difficulty of Rt limb use in a variety of questionable activities.  Baseline: 4 Goal status: INITIAL  2.  Pt to demonstrate 5/5 Rt shoulder flexion, abduction, ER, IR, extension; 5/5 Rt elbow flexion/extension  Baseline:  Goal status: INITIAL  3.  Pt to demonstrate >155 degrees Rt shoulder A/ROM without end-range pain>2/10.  Baseline:  Goal status: INITIAL  4.  Pt to demonstrate ability to perform gym-type exercises of resistance and aerobic natures without pain exacerbation in shoulder to allow for return to regular exercise at DC.  Baseline:  Goal status: INITIAL  PLAN:  PT FREQUENCY: 1-2x/week  PT DURATION: 3 months   PLANNED INTERVENTIONS: 97110-Therapeutic exercises, 97530- Therapeutic activity, [id number]- Neuromuscular re-education, 97535- Self Care, 30865- Manual therapy, 97014- Electrical stimulation (unattended), [id number]- Electrical stimulation (manual), Patient/Family education, and Moist heat  PLAN FOR NEXT SESSION: Review HEP, add in isometric shoulder flexion, abduction, rotation, extension.  11:30 AM, 01/07/23 Rosamaria Lints, PT, DPT Physical Therapist - [facility]  Outpatient Physical Therapy- [facility]  [facility] [phone]       [place] C, PT 01/07/2023, 10:37 AM

## 2023-01-09 ENCOUNTER — Ambulatory Visit: Payer: 59

## 2023-01-11 ENCOUNTER — Other Ambulatory Visit: Payer: Self-pay

## 2023-01-11 ENCOUNTER — Telehealth: Payer: Self-pay | Admitting: Orthopaedic Surgery

## 2023-01-11 NOTE — Telephone Encounter (Signed)
[employer] forms received. To [employer]. 

## 2023-01-13 ENCOUNTER — Encounter (HOSPITAL_BASED_OUTPATIENT_CLINIC_OR_DEPARTMENT_OTHER): Payer: Self-pay | Admitting: Orthopaedic Surgery

## 2023-01-14 ENCOUNTER — Ambulatory Visit: Payer: 59

## 2023-01-15 ENCOUNTER — Ambulatory Visit: Payer: 59

## 2023-01-15 DIAGNOSIS — M6281 Muscle weakness (generalized): Secondary | ICD-10-CM | POA: Diagnosis not present

## 2023-01-15 DIAGNOSIS — M25511 Pain in right shoulder: Secondary | ICD-10-CM | POA: Diagnosis not present

## 2023-01-15 DIAGNOSIS — M25619 Stiffness of unspecified shoulder, not elsewhere classified: Secondary | ICD-10-CM

## 2023-01-15 DIAGNOSIS — M25611 Stiffness of right shoulder, not elsewhere classified: Secondary | ICD-10-CM | POA: Diagnosis not present

## 2023-01-15 DIAGNOSIS — M75111 Incomplete rotator cuff tear or rupture of right shoulder, not specified as traumatic: Secondary | ICD-10-CM | POA: Diagnosis not present

## 2023-01-15 NOTE — Therapy (Signed)
OUTPATIENT PHYSICAL THERAPY SHOULDER TREATMENT   Patient Name: Shelby Henson MRN: [id number] DOB:November 01, 1970, 52 y.o., female Today's Date: 01/15/2023  END OF SESSION:  PT End of Session - 01/15/23 0904     Visit Number 2    Number of Visits 24    Date for PT Re-Evaluation 04/07/23    Authorization Type [facility] [employer] FOCUS    Authorization Time Period 01/07/23-04/07/23    Progress Note Due on Visit 10    PT Start Time 0854    PT Stop Time 0928    PT Time Calculation (min) 34 min    Activity Tolerance Patient tolerated treatment well;Patient limited by pain    Behavior During Therapy [facility] for tasks assessed/performed             Past Medical History:  Diagnosis Date   Anemia    Asthma    well controlled   Colon polyps    Complication of anesthesia    DDD (degenerative disc disease), lumbar    Depression    GERD (gastroesophageal reflux disease)    occ   Iron deficiency anemia    Migraine    Morbid obesity (HCC)    Osteoarthritis    Osteoarthritis of right hip    PONV (postoperative nausea and vomiting)    with c-section x 1   Right rotator cuff tear 11/2022   Vitamin D deficiency    Past Surgical History:  Procedure Laterality Date   BREAST CYST ASPIRATION Left    CESAREAN SECTION  1990   CESAREAN SECTION  1993   CESAREAN SECTION  1996   COLONOSCOPY N/A 09/22/2020   Procedure: COLONOSCOPY;  Surgeon: Shelby Spillers, MD;  Location: [facility];  Service: Endoscopy;  Laterality: N/A;   LAPAROSCOPY  2018   LEEP  2005   benign pathology; Dr. Luella Henson   SHOULDER ACROMIOPLASTY Right 01/03/2023   Procedure: RIGHT SHOULDER ACROMIOPLASTY;  Surgeon: Shelby Cote, MD;  Location: [facility];  Service: Orthopedics;  Laterality: Right;   SHOULDER ARTHROSCOPY WITH ROTATOR CUFF REPAIR AND OPEN BICEPS TENODESIS Right 01/03/2023   Procedure: RIGHT SHOULDER ARTHROSCOPY WITH ROTATOR CUFF REPAIR AND  BICEPS TENODESIS;  Surgeon: Shelby Cote, MD;  Location: [facility];   Service: Orthopedics;  Laterality: Right;   SUPRACERVICAL ABDOMINAL HYSTERECTOMY  2010   leio/AUB; Dr. Luella Henson   TOTAL HIP ARTHROPLASTY Right 11/12/2018   Procedure: TOTAL HIP ARTHROPLASTY ANTERIOR APPROACH;  Surgeon: Shelby Herrlich, MD;  Location: [facility];  Service: Orthopedics;  Laterality: Right;   Patient Active Problem List   Diagnosis Date Noted   Nontraumatic incomplete tear of right rotator cuff 01/03/2023   Right rotator cuff tendonitis 09/11/2022   Iron deficiency anemia due to dietary causes 08/10/2022   Asthma, moderate persistent, poorly-controlled 08/10/2022   Chronic right shoulder pain 05/11/2022   Morbid obesity (HCC) 01/05/2022   Rotator cuff impingement syndrome, right 09/29/2021   Biceps tendinitis, right 09/29/2021   Meralgia paresthetica of right side 03/29/2021   Psoas tendinitis, right hip 03/29/2021   History of total right hip arthroplasty 03/29/2021   Status post total replacement of right hip 02/19/2021   Meralgia paresthetica of right side 02/19/2021   Polyp of ascending colon 02/19/2021   Polyp of sigmoid colon 02/19/2021   Special screening for malignant neoplasms, colon    Polyp of ascending colon    Polyp of sigmoid colon    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 03/23/2020   Pain in right  hip 01/12/2019   Vitamin D deficiency 02/25/2018   Chronic pain 10/24/2015   Hot flashes 10/24/2015   Migraine without aura and without status migrainosus, not intractable 10/24/2015   Metabolic syndrome 10/24/2015   Dyslipidemia 10/24/2015   Allergic rhinitis, seasonal 05/17/2015   Asthma in adult, mild intermittent, with acute exacerbation 08/08/2014   Chronic insomnia 08/08/2014   DDD (degenerative disc disease), lumbar 08/08/2014   Mood disorder (HCC) 08/08/2014   Gastroesophageal reflux disease without esophagitis 08/08/2014   H/O total hysterectomy 08/08/2014   Irritable bowel syndrome with diarrhea 08/08/2014    Osteoarthritis of right hip 08/08/2014   Adult BMI 30+ 08/08/2014   SBO (spina bifida occulta) 08/08/2014    PCP: Shelby Cory, MD   REFERRING PROVIDER: Huel Cote, MD orthopedics   REFERRING DIAG: Rt arthroscopic rotator cuff repair   THERAPY DIAG:  Right shoulder pain, unspecified chronicity  Stiffness of right shoulder, not elsewhere classified  Muscle weakness (generalized)  Limited range of motion (ROM) of shoulder  Rationale for Evaluation and Treatment: Rehabilitation  ONSET DATE: 01/03/23  SUBJECTIVE:                                                                                                                                                                                      SUBJECTIVE STATEMENT: Patient reports her sling is ill fitting and going by MD office right after PT to have adjusted or switched out. Reports pain limited mobility and states compliance with prescribed HEP to date.    Hand dominance: Righty   PERTINENT HISTORY: Shelby Henson is a 52yo F presents to outpatient physical therapy on postop day 4 status post right arthroscopic shoulder debridement of supraspinatus tendon, anterior glenoid labrum, subacromial depression, rotator cuff repair with Dr. Huel Henson.  Patient reports more than 1 year of insidious onset shoulder pain previously followed by sports medicine Dr. Ashley Henson.  PAIN:  Are you having pain? 3/10 right shoulder: Worst pain was 8 out of 10 while sleeping overnight 2 nights ago.  PRECAUTIONS: Patient in sling immobilizer and nonweightbearing x 2 weeks.  Awaiting updates pending follow-up visit with orthopedics.   WEIGHT BEARING RESTRICTIONS: Yes, right upper extremity nonweightbearing  FALLS:  Has patient fallen in last 6 months?  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 PATIENT SURVEYS:  FOTO: 4  SENSATION: WNL  POSTURE: WNL  UPPER EXTREMITY ROM:    ROM Right  eval Left eval  Shoulder flexion 90   Shoulder extension NT   Shoulder abduction NT   Shoulder internal rotation full   Shoulder external rotation NT   Elbow flexion full   Elbow extension full    Wrist flexion full    Wrist extension full    Wrist ulnar deviation full    Wrist radial deviation full    forearm pronation full    forearm supination full    (Blank rows = not tested)  PALPATION:  Tenderness at the posterior shoulder portal wound while adjusting/donning sling   TODAY'S TREATMENT:                                                                                                                                         DATE: 01/15/23  THEREX: Review and educated in the following:  -CCW wrist circle, CW wrist circle x 10 reps - AROM Right wrist flex/ext x 10 reps  AROM -forearm supination and pronation  x 10 reps each -standing elbow AROM flexion extension (in full supination)  -standing Rt GHJ pendulum 1x15sec   *observed right shoulder- dressing in tact with no evidence of any would infection surrounding bandages.  *Patient to go by MD office and obtain a shoulder protocol as Author unable to locate in electronic chart.   Manual therapy:  Gentle grade 1-2 GH joint mobs (inf/PA/AP) x several min Gentle PROM right shoulder elevation < 90 deg x several min     PATIENT EDUCATION: Education details: See above- extensive education this visit  Person educated: patient  Education method: Research scientist (medical), deliberate practice, positive reinforcement, explicit instruction, establish rules,  Education comprehension: adequate   HOME EXERCISE PROGRAM: Access Code: [email or web address] URL:  [email or web address] Date: 01/07/2023 Prepared by: Alvera Novel  Exercises - Seated Cervical Sidebending Stretch  - 1-2 x daily - 3 sets - 30 hold - Seated Assisted Cervical Rotation with Towel  - 1-2 x daily - 3 sets - 30 hold - Sidelying Thoracic Rotation Book Openings- Both sides  - 1 x daily - 1-2 x weekly - 1 sets - 20 reps - 1-2sec hold - Supine Cervical Retraction with Towel  - 2 x daily - 7 x weekly - 1 sets - 15 reps - 3-5sec hold - Shoulder external rotation, scapular retraction RED band  - 1 x daily - 3 x weekly - 3 sets - 15 reps - Standing Bilateral Low Shoulder Row with Anchored Resistance  - 1 x daily - 7 x weekly - 3 sets - 15 reps  ASSESSMENT:  CLINICAL IMPRESSION: Patient presents today in sling in no acute distress- able to don/doff independently. She exhibited good recall of all beginner exercises and able to relax some with manual PROM today. Treatment very limited due to acute nature of condition s/p Arthroscopic RCR and patient to obtain a protocol prior to next session. She was pain limited  but was able to demo in session improved ability to relax with ROM activities. Patient will benefit from skilled PT services to fully rehabilitate this operative shoulder to allow for full return to PLOF in UE use for ADL, IADL, and fitness/leisure.   OBJECTIVE IMPAIRMENTS: decreased activity tolerance, decreased endurance, decreased knowledge of condition, decreased ROM, decreased strength, increased muscle spasms, impaired flexibility, and improper body mechanics.   ACTIVITY LIMITATIONS: carrying, lifting, bathing, toileting, dressing, self feeding, reach over head, and hygiene/grooming  PARTICIPATION LIMITATIONS: meal prep, cleaning, laundry, driving, shopping, community activity, occupation, and yard work  PERSONAL FACTORS: Age, Behavior pattern, Education, Fitness, Past/current experiences, Profession, Social background, Time since onset of  injury/illness/exacerbation, and Transportation are also affecting patient's functional outcome.   REHAB POTENTIAL: Excellent  CLINICAL DECISION MAKING: Stable/uncomplicated  EVALUATION COMPLEXITY: Low   GOALS: Goals reviewed with patient? No  SHORT TERM GOALS: Target date: 02/07/23  Pt to demonstrate Rt shoulder P/ROM flexion to >120 degrees, ABDCT >90 degrees, ER >45 degrees to improve ability to perform ADL/selfcare.  Baseline: Goal status: INITIAL  2.  Pt to report successful HEP performance and successful adherence to postoperative restrictions.  Baseline:  Goal status: INITIAL  3.  Pt to demonstrate 3/5 Rt shoulder MMT to 90 degrees of flexion and 90 degrees of abduction.  Baseline: unable Goal status: INITIAL  LONG TERM GOALS: Target date: 04/07/23  Pt to improve FOTO survey goal by >60 points to indicate a monstrous reduction in difficulty of Rt limb use in a variety of questionable activities.  Baseline: 4 Goal status: INITIAL  2.  Pt to demonstrate 5/5 Rt shoulder flexion, abduction, ER, IR, extension; 5/5 Rt elbow flexion/extension  Baseline:  Goal status: INITIAL  3.  Pt to demonstrate >155 degrees Rt shoulder A/ROM without end-range pain>2/10.  Baseline:  Goal status: INITIAL  4.  Pt to demonstrate ability to perform gym-type exercises of resistance and aerobic natures without pain exacerbation in shoulder to allow for return to regular exercise at DC.  Baseline:  Goal status: INITIAL  PLAN:  PT FREQUENCY: 1-2x/week  PT DURATION: 3 months   PLANNED INTERVENTIONS: 97110-Therapeutic exercises, 97530- Therapeutic activity, [id number]- Neuromuscular re-education, 97535- Self Care, 27253- Manual therapy, 97014- Electrical stimulation (unattended), [id number]- Electrical stimulation (manual), Patient/Family education, and Moist heat  PLAN FOR NEXT SESSION: Review HEP, add in isometric shoulder flexion, abduction, rotation, extension.  3:05 PM, 01/15/23 Louis Meckel,  PT Physical Therapist - [facility]  [facility] [phone]       Lenda Kelp, PT 01/15/2023, 3:05 PM

## 2023-01-16 ENCOUNTER — Ambulatory Visit: Payer: 59

## 2023-01-16 ENCOUNTER — Ambulatory Visit: Payer: 59 | Admitting: Pulmonary Disease

## 2023-01-17 ENCOUNTER — Ambulatory Visit: Payer: 59 | Admitting: Orthopaedic Surgery

## 2023-01-17 DIAGNOSIS — M75111 Incomplete rotator cuff tear or rupture of right shoulder, not specified as traumatic: Secondary | ICD-10-CM

## 2023-01-17 NOTE — Progress Notes (Signed)
Post Operative Evaluation    Procedure/Date of Surgery: Right rotator cuff repair with collagen patch augmentation 12/5   Interval History:  . Presents 2 weeks status post the above procedure.  Overall she is doing well.  She has been compliant with sling usage.  She has been working with physical therapy.  She has remained out of work.   PMH/PSH/Family History/Social History/Meds/Allergies:    Past Medical History:  Diagnosis Date   Anemia    Asthma    well controlled   Colon polyps    Complication of anesthesia    DDD (degenerative disc disease), lumbar    Depression    GERD (gastroesophageal reflux disease)    occ   Iron deficiency anemia    Migraine    Morbid obesity (HCC)    Osteoarthritis    Osteoarthritis of right hip    PONV (postoperative nausea and vomiting)    with c-section x 1   Right rotator cuff tear 11/2022   Vitamin D deficiency    Past Surgical History:  Procedure Laterality Date   BREAST CYST ASPIRATION Left    CESAREAN SECTION  1990   CESAREAN SECTION  1993   CESAREAN SECTION  1996   COLONOSCOPY N/A 09/22/2020   Procedure: COLONOSCOPY;  Surgeon: Pasty Spillers, MD;  Location: [facility];  Service: Endoscopy;  Laterality: N/A;   LAPAROSCOPY  2018   LEEP  2005   benign pathology; Dr. Luella Cook   SHOULDER ACROMIOPLASTY Right 01/03/2023   Procedure: RIGHT SHOULDER ACROMIOPLASTY;  Surgeon: Huel Cote, MD;  Location: [facility];  Service: Orthopedics;  Laterality: Right;   SHOULDER ARTHROSCOPY WITH ROTATOR CUFF REPAIR AND OPEN BICEPS TENODESIS Right 01/03/2023   Procedure: RIGHT SHOULDER ARTHROSCOPY WITH ROTATOR CUFF REPAIR AND  BICEPS TENODESIS;  Surgeon: Huel Cote, MD;  Location: [facility];  Service: Orthopedics;  Laterality: Right;   SUPRACERVICAL ABDOMINAL HYSTERECTOMY  2010   leio/AUB; Dr. Luella Cook   TOTAL HIP ARTHROPLASTY Right 11/12/2018   Procedure: TOTAL HIP ARTHROPLASTY ANTERIOR APPROACH;   Surgeon: Lyndle Herrlich, MD;  Location: [facility];  Service: Orthopedics;  Laterality: Right;   Social History   Socioeconomic History   Marital status: Divorced    Spouse name: Not on file   Number of children: 3   Years of education: Not on file   Highest education level: Associate degree: occupational, Scientist, product/process development, or vocational program  Occupational History   Not on file  Tobacco Use   Smoking status: Never   Smokeless tobacco: Never  Vaping Use   Vaping status: Never Used  Substance and Sexual Activity   Alcohol use: Yes    Alcohol/week: 0.0 standard drinks of alcohol    Comment: occasionally   Drug use: No   Sexual activity: Yes    Birth control/protection: Surgical    Comment: Hysterectomy  Other Topics Concern   Not on file  Social History Narrative   Not on file   Social Drivers of Health   Financial Resource Strain: Low Risk  (12/07/2022)   Overall Financial Resource Strain (CARDIA)    Difficulty of Paying Living Expenses: Not very hard  Food Insecurity: Patient Declined (12/07/2022)   Hunger Vital Sign    Worried About Running Out of Food in the Last Year: Patient declined    Barista  in the Last Year: Patient declined  Transportation Needs: No Transportation Needs (12/07/2022)   PRAPARE - Administrator, Civil Service (Medical): No    Lack of Transportation (Non-Medical): No  Physical Activity: Inactive (12/07/2022)   Exercise Vital Sign    Days of Exercise per Week: 0 days    Minutes of Exercise per Session: 90 min  Stress: Stress Concern Present (12/07/2022)   [employer] of Occupational Health - Occupational Stress Questionnaire    Feeling of Stress : Rather much  Social Connections: Unknown (12/07/2022)   Social Connection and Isolation Panel [NHANES]    Frequency of Communication with Friends and Family: More than three times a week    Frequency of Social Gatherings with Friends and Family: More than three times a week    Attends  Religious Services: Patient declined    Database administrator or Organizations: No    Attends Engineer, structural: Not on file    Marital Status: Divorced   Family History  Problem Relation Age of Onset   Depression Mother    Bipolar disorder Brother    Colon cancer Maternal Grandfather        not sure of age   Breast cancer Neg Hx    Allergies  Allergen Reactions   Celecoxib Hives   Prednisone     intolerance, makes her angry. Makes her angry   Topamax [Topiramate]     Mood changes   Zolpidem     Mood changes   Codeine Rash   Penicillins Rash    Did it involve swelling of the face/tongue/throat, SOB, or low BP? No Did it involve sudden or severe rash/hives, skin peeling, or any reaction on the inside of your mouth or nose? Yes Did you need to seek medical attention at a hospital or doctor's office? Yes When did it last happen? More than 20 years ago       If all above answers are "NO", may proceed with cephalosporin use.    Current Outpatient Medications  Medication Sig Dispense Refill   acetaminophen (TYLENOL) 500 MG tablet Take 1,000 mg by mouth every 6 (six) hours as needed for moderate pain (pain score 4-6).     albuterol (PROVENTIL) (2.5 MG/3ML) 0.083% nebulizer solution Take 3 mLs (2.5 mg total) by nebulization every 6 (six) hours as needed for wheezing or shortness of breath 75 mL 1   Albuterol-Budesonide (AIRSUPRA) 90-80 MCG/ACT AERO Inhale 2 puffs into the lungs 4 (four) times daily as needed. 10.7 g 1   aspirin EC 325 MG tablet Take 1 tablet (325 mg total) by mouth daily. (Patient taking differently: Take 325 mg by mouth daily. For after the surgery) 14 tablet 0   desvenlafaxine (PRISTIQ) 50 MG 24 hr tablet Take 1 tablet (50 mg total) by mouth daily. 90 tablet 1   diclofenac Sodium (VOLTAREN) 1 % GEL Apply 2 grams topically 4 (four) times daily. 100 g 1   famotidine (PEPCID) 40 MG tablet Take 1 tablet (40 mg total) by mouth daily. 90 tablet 1    Fluticasone-Umeclidin-Vilant (TRELEGY ELLIPTA) 200-62.5-25 MCG/ACT AEPB Inhale 1 puff into the lungs daily. (Patient taking differently: Inhale 1 puff into the lungs at bedtime.) 60 each 6   gabapentin (NEURONTIN) 300 MG capsule Take 3 capsules (900 mg total) by mouth at bedtime. 270 capsule 1   Galcanezumab-gnlm (EMGALITY) 120 MG/ML SOAJ Inject 1 mL into the skin every 30 (thirty) days. 1 mL 5   lidocaine (  LIDODERM) 5 % Place 2 patches onto the skin daily. Remove & Discard patch within 12 hours or as directed by MD (Patient not taking: Reported on 12/25/2022) 60 patch 2   oxyCODONE (ROXICODONE) 5 MG immediate release tablet Take 1 tablet (5 mg total) by mouth every 4 (four) hours as needed for severe pain (pain score 7-10) or breakthrough pain. 15 tablet 0   pantoprazole (PROTONIX) 40 MG tablet Take 1 tablet (40 mg total) by mouth daily. (Patient taking differently: Take 40 mg by mouth daily as needed.) 30 tablet 1   prazosin (MINIPRESS) 2 MG capsule Take 1 capsule (2 mg total) by mouth at bedtime. 90 capsule 0   No current facility-administered medications for this visit.   No results found.  Review of Systems:   A ROS was performed including pertinent positives and negatives as documented in the HPI.   Musculoskeletal Exam:    There were no vitals taken for this visit.  Right shoulder incisions are well-appearing without erythema or drainage.  2+ radial pulse.  Distal neurosensory exam is intact.  Sling is well-fitting after adjustment  Imaging:      I personally reviewed and interpreted the radiographs.   Assessment:   2-week status post right shoulder rotator cuff repair with collagen patch augmentation.  Overall doing well.  At this time she will continue to advance according the rotator cuff repair protocol.  She will continue passive range of motion as time.  I will plan to see her back in 4 weeks for reassessment.  She remain out of work till this time  Plan :    -Return to  clinic 4 weeks for reassessment      I personally saw and evaluated the patient, and participated in the management and treatment plan.  Huel Cote, MD Attending Physician, Orthopedic Surgery  This document was dictated using Dragon voice recognition software. A reasonable attempt at proof reading has been made to minimize errors.

## 2023-01-21 ENCOUNTER — Ambulatory Visit: Payer: 59

## 2023-01-21 DIAGNOSIS — M25511 Pain in right shoulder: Secondary | ICD-10-CM

## 2023-01-21 DIAGNOSIS — M25619 Stiffness of unspecified shoulder, not elsewhere classified: Secondary | ICD-10-CM | POA: Diagnosis not present

## 2023-01-21 DIAGNOSIS — M6281 Muscle weakness (generalized): Secondary | ICD-10-CM

## 2023-01-21 DIAGNOSIS — M25611 Stiffness of right shoulder, not elsewhere classified: Secondary | ICD-10-CM | POA: Diagnosis not present

## 2023-01-21 DIAGNOSIS — M75111 Incomplete rotator cuff tear or rupture of right shoulder, not specified as traumatic: Secondary | ICD-10-CM | POA: Diagnosis not present

## 2023-01-21 NOTE — Therapy (Signed)
OUTPATIENT PHYSICAL THERAPY SHOULDER TREATMENT   Patient Name: Shelby Henson MRN: [id number] DOB:1970-06-05, 52 y.o., female Today's Date: 01/21/2023  END OF SESSION:  PT End of Session - 01/21/23 1048     Visit Number 3    Number of Visits 24    Date for PT Re-Evaluation 04/07/23    Authorization Type [facility] [employer]    Authorization Time Period 01/07/23-04/07/23    Progress Note Due on Visit 10    PT Start Time 1018    PT Stop Time 1053    PT Time Calculation (min) 35 min    Activity Tolerance Patient tolerated treatment well;Patient limited by pain    Behavior During Therapy WFL for tasks assessed/performed              Past Medical History:  Diagnosis Date   Anemia    Asthma    well controlled   Colon polyps    Complication of anesthesia    DDD (degenerative disc disease), lumbar    Depression    GERD (gastroesophageal reflux disease)    occ   Iron deficiency anemia    Migraine    Morbid obesity (HCC)    Osteoarthritis    Osteoarthritis of right hip    PONV (postoperative nausea and vomiting)    with c-section x 1   Right rotator cuff tear 11/2022   Vitamin D deficiency    Past Surgical History:  Procedure Laterality Date   BREAST CYST ASPIRATION Left    CESAREAN SECTION  1990   CESAREAN SECTION  1993   CESAREAN SECTION  1996   COLONOSCOPY N/A 09/22/2020   Procedure: COLONOSCOPY;  Surgeon: Pasty Spillers, MD;  Location: [facility];  Service: Endoscopy;  Laterality: N/A;   LAPAROSCOPY  2018   LEEP  2005   benign pathology; Dr. Luella Cook   SHOULDER ACROMIOPLASTY Right 01/03/2023   Procedure: RIGHT SHOULDER ACROMIOPLASTY;  Surgeon: Huel Cote, MD;  Location: [facility];  Service: Orthopedics;  Laterality: Right;   SHOULDER ARTHROSCOPY WITH ROTATOR CUFF REPAIR AND OPEN BICEPS TENODESIS Right 01/03/2023   Procedure: RIGHT SHOULDER ARTHROSCOPY WITH ROTATOR CUFF REPAIR AND  BICEPS TENODESIS;  Surgeon: Huel Cote, MD;  Location: [facility];   Service: Orthopedics;  Laterality: Right;   SUPRACERVICAL ABDOMINAL HYSTERECTOMY  2010   leio/AUB; Dr. Luella Cook   TOTAL HIP ARTHROPLASTY Right 11/12/2018   Procedure: TOTAL HIP ARTHROPLASTY ANTERIOR APPROACH;  Surgeon: Lyndle Herrlich, MD;  Location: [facility];  Service: Orthopedics;  Laterality: Right;   Patient Active Problem List   Diagnosis Date Noted   Nontraumatic incomplete tear of right rotator cuff 01/03/2023   Right rotator cuff tendonitis 09/11/2022   Iron deficiency anemia due to dietary causes 08/10/2022   Asthma, moderate persistent, poorly-controlled 08/10/2022   Chronic right shoulder pain 05/11/2022   Morbid obesity (HCC) 01/05/2022   Rotator cuff impingement syndrome, right 09/29/2021   Biceps tendinitis, right 09/29/2021   Meralgia paresthetica of right side 03/29/2021   Psoas tendinitis, right hip 03/29/2021   History of total right hip arthroplasty 03/29/2021   Status post total replacement of right hip 02/19/2021   Meralgia paresthetica of right side 02/19/2021   Polyp of ascending colon 02/19/2021   Polyp of sigmoid colon 02/19/2021   Special screening for malignant neoplasms, colon    Polyp of ascending colon    Polyp of sigmoid colon    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 03/23/2020   Pain in  right hip 01/12/2019   Vitamin D deficiency 02/25/2018   Chronic pain 10/24/2015   Hot flashes 10/24/2015   Migraine without aura and without status migrainosus, not intractable 10/24/2015   Metabolic syndrome 10/24/2015   Dyslipidemia 10/24/2015   Allergic rhinitis, seasonal 05/17/2015   Asthma in adult, mild intermittent, with acute exacerbation 08/08/2014   Chronic insomnia 08/08/2014   DDD (degenerative disc disease), lumbar 08/08/2014   Mood disorder (HCC) 08/08/2014   Gastroesophageal reflux disease without esophagitis 08/08/2014   H/O total hysterectomy 08/08/2014   Irritable bowel syndrome with diarrhea 08/08/2014    Osteoarthritis of right hip 08/08/2014   Adult BMI 30+ 08/08/2014   SBO (spina bifida occulta) 08/08/2014    PCP: Alba Cory, MD   REFERRING PROVIDER: Huel Cote, MD orthopedics   REFERRING DIAG: Rt arthroscopic rotator cuff repair   THERAPY DIAG:  Right shoulder pain, unspecified chronicity  Stiffness of right shoulder, not elsewhere classified  Muscle weakness (generalized)  Limited range of motion (ROM) of shoulder  Rationale for Evaluation and Treatment: Rehabilitation  ONSET DATE: 01/03/23  SUBJECTIVE:                                                                                                                                                                                      SUBJECTIVE STATEMENT: Patient reports doing well overall. States she had a good visit with MD last week and progressing as appropriate. She states she didn't have to take any pain meds yesterday.    Hand dominance: Right  PERTINENT HISTORY: Shelby Henson is a 52yo F presents to outpatient physical therapy on postop day 4 status post right arthroscopic shoulder debridement of supraspinatus tendon, anterior glenoid labrum, subacromial depression, rotator cuff repair with Dr. Huel Cote.  Patient reports more than 1 year of insidious onset shoulder pain previously followed by sports medicine Dr. Ashley Royalty.  PAIN:  Are you having pain? 3/10 right shoulder: Worst pain was 8 out of 10 while sleeping overnight 2 nights ago.  PRECAUTIONS: Patient in sling immobilizer and nonweightbearing x 2 weeks.  Awaiting updates pending follow-up visit with orthopedics.   WEIGHT BEARING RESTRICTIONS: Yes, right upper extremity nonweightbearing  FALLS:  Has patient fallen in last 6 months?  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 PATIENT SURVEYS:  FOTO: 4  SENSATION: WNL  POSTURE: WNL  UPPER EXTREMITY ROM:    ROM Right eval Left eval  Shoulder flexion 90   Shoulder extension NT   Shoulder abduction NT   Shoulder internal rotation full   Shoulder external rotation NT   Elbow flexion full   Elbow extension full    Wrist flexion full    Wrist extension full    Wrist ulnar deviation full    Wrist radial deviation full    forearm pronation full    forearm supination full    (Blank rows = not tested)    PALPATION:  Tenderness at the posterior shoulder portal wound while adjusting/donning sling   *PROTOCOL LOCATED IN MEDIA SECTION OF EPIC   TODAY'S TREATMENT:                                                                                                                                         DATE: 01/21/23   Manual therapy: (supine)  Gentle grade 1-2 GH joint mobs (inf/PA/AP) x several min Gentle PROM right shoulder flex < 110 deg x several min- empty end feel with initial mm guarding- improved over time Gentle PROM right shoulder ABD <80 deg  x several min- No pain within this ROM Gentle PROM right shoulder ER- up to 40 deg x several min- no pain   Cryotherapy: (Unbilled)  Ice to Right shoulder at end of session- x 8 min   PATIENT EDUCATION: Education details: See above- extensive education this visit  Person educated: patient  Education method: Research scientist (medical), deliberate practice, positive reinforcement, explicit instruction, establish rules,  Education comprehension: adequate   HOME EXERCISE PROGRAM: Access Code: [email or web address] URL: [email or web address] Date: 01/07/2023 Prepared by: Alvera Novel  Exercises - Seated Cervical Sidebending Stretch  - 1-2 x daily - 3 sets - 30 hold - Seated Assisted Cervical Rotation with Towel  - 1-2 x  daily - 3 sets - 30 hold - Sidelying Thoracic Rotation Book Openings- Both sides  - 1 x daily - 1-2 x weekly - 1 sets - 20 reps - 1-2sec hold - Supine Cervical Retraction with Towel  - 2 x daily - 7 x weekly - 1 sets - 15 reps - 3-5sec hold - Shoulder external rotation, scapular retraction RED band  - 1 x daily - 3 x weekly - 3 sets - 15 reps - Standing Bilateral Low Shoulder Row with Anchored Resistance  - 1 x daily - 7 x weekly - 3 sets - 15 reps  ASSESSMENT:  CLINICAL IMPRESSION: Patient presents with good motivation and pain well controlled today. Followed protocol provided by ortho MD and patient performed well- able to relax better today with manual techniques - mostly limited by MD restrictions. She denied any worsening with pain and able to achieve most of ROM limits except for shoulder flex today.  Patient will benefit from skilled PT services to fully rehabilitate this operative shoulder to allow for full return to PLOF in UE use for ADL, IADL, and fitness/leisure.  OBJECTIVE IMPAIRMENTS: decreased activity tolerance, decreased endurance, decreased knowledge of condition, decreased ROM, decreased strength, increased muscle spasms, impaired flexibility, and improper body mechanics.   ACTIVITY LIMITATIONS: carrying, lifting, bathing, toileting, dressing, self feeding, reach over head, and hygiene/grooming  PARTICIPATION LIMITATIONS: meal prep, cleaning, laundry, driving, shopping, community activity, occupation, and yard work  PERSONAL FACTORS: Age, Behavior pattern, Education, Fitness, Past/current experiences, Profession, Social background, Time since onset of injury/illness/exacerbation, and Transportation are also affecting patient's functional outcome.   REHAB POTENTIAL: Excellent  CLINICAL DECISION MAKING: Stable/uncomplicated  EVALUATION COMPLEXITY: Low   GOALS: Goals reviewed with patient? No  SHORT TERM GOALS: Target date: 02/07/23  Pt to demonstrate Rt shoulder P/ROM  flexion to >120 degrees, ABDCT >90 degrees, ER >45 degrees to improve ability to perform ADL/selfcare.  Baseline: Goal status: INITIAL  2.  Pt to report successful HEP performance and successful adherence to postoperative restrictions.  Baseline:  Goal status: INITIAL  3.  Pt to demonstrate 3/5 Rt shoulder MMT to 90 degrees of flexion and 90 degrees of abduction.  Baseline: unable Goal status: INITIAL  LONG TERM GOALS: Target date: 04/07/23  Pt to improve FOTO survey goal by >60 points to indicate a monstrous reduction in difficulty of Rt limb use in a variety of questionable activities.  Baseline: 4 Goal status: INITIAL  2.  Pt to demonstrate 5/5 Rt shoulder flexion, abduction, ER, IR, extension; 5/5 Rt elbow flexion/extension  Baseline:  Goal status: INITIAL  3.  Pt to demonstrate >155 degrees Rt shoulder A/ROM without end-range pain>2/10.  Baseline:  Goal status: INITIAL  4.  Pt to demonstrate ability to perform gym-type exercises of resistance and aerobic natures without pain exacerbation in shoulder to allow for return to regular exercise at DC.  Baseline:  Goal status: INITIAL  PLAN:  PT FREQUENCY: 1-2x/week  PT DURATION: 3 months   PLANNED INTERVENTIONS: 97110-Therapeutic exercises, 97530- Therapeutic activity, 97112- Neuromuscular re-education, 97535- Self Care, 09323- Manual therapy, 97014- Electrical stimulation (unattended), [phone]- Electrical stimulation (manual), Patient/Family education, and Moist heat  PLAN FOR NEXT SESSION: continue per Shoulder protocol - week 3  1:06 PM, 01/21/23 Louis Meckel, PT Physical Therapist - [facility]  Outpatient Physical Therapy- [facility] [phone]       Lenda Kelp, PT 01/21/2023, 1:06 PM

## 2023-01-24 ENCOUNTER — Other Ambulatory Visit: Payer: Self-pay

## 2023-01-24 ENCOUNTER — Other Ambulatory Visit (HOSPITAL_BASED_OUTPATIENT_CLINIC_OR_DEPARTMENT_OTHER): Payer: Self-pay | Admitting: Student

## 2023-01-24 ENCOUNTER — Encounter (HOSPITAL_BASED_OUTPATIENT_CLINIC_OR_DEPARTMENT_OTHER): Payer: Self-pay | Admitting: Orthopaedic Surgery

## 2023-01-24 MED ORDER — TRAMADOL HCL 50 MG PO TABS
50.0000 mg | ORAL_TABLET | Freq: Four times a day (QID) | ORAL | 0 refills | Status: AC | PRN
  Filled 2023-01-24: qty 20, 5d supply, fill #0

## 2023-01-24 NOTE — Therapy (Signed)
OUTPATIENT PHYSICAL THERAPY SHOULDER TREATMENT   Patient Name: Shelby Henson MRN: [id number] DOB:06-23-1970, 52 y.o., female Today's Date: 01/28/2023  END OF SESSION:  PT End of Session - 01/28/23 0758     Visit Number 4    Number of Visits 24    Date for PT Re-Evaluation 04/07/23    Authorization Type [facility] [employer]    Authorization Time Period 01/07/23-04/07/23    Progress Note Due on Visit 10    PT Start Time 0800    PT Stop Time 0840    PT Time Calculation (min) 40 min    Activity Tolerance Patient tolerated treatment well;Patient limited by pain    Behavior During Therapy [facility] for tasks assessed/performed               Past Medical History:  Diagnosis Date   Anemia    Asthma    well controlled   Colon polyps    Complication of anesthesia    DDD (degenerative disc disease), lumbar    Depression    GERD (gastroesophageal reflux disease)    occ   Iron deficiency anemia    Migraine    Morbid obesity (HCC)    Osteoarthritis    Osteoarthritis of right hip    PONV (postoperative nausea and vomiting)    with c-section x 1   Right rotator cuff tear 11/2022   Vitamin D deficiency    Past Surgical History:  Procedure Laterality Date   BREAST CYST ASPIRATION Left    CESAREAN SECTION  1990   CESAREAN SECTION  1993   CESAREAN SECTION  1996   COLONOSCOPY N/A 09/22/2020   Procedure: COLONOSCOPY;  Surgeon: Pasty Spillers, MD;  Location: [facility] ENDOSCOPY;  Service: Endoscopy;  Laterality: N/A;   LAPAROSCOPY  2018   LEEP  2005   benign pathology; Dr. Luella Cook   SHOULDER ACROMIOPLASTY Right 01/03/2023   Procedure: RIGHT SHOULDER ACROMIOPLASTY;  Surgeon: Huel Cote, MD;  Location: [facility] ORS;  Service: Orthopedics;  Laterality: Right;   SHOULDER ARTHROSCOPY WITH ROTATOR CUFF REPAIR AND OPEN BICEPS TENODESIS Right 01/03/2023   Procedure: RIGHT SHOULDER ARTHROSCOPY WITH ROTATOR CUFF REPAIR AND  BICEPS TENODESIS;  Surgeon: Huel Cote, MD;  Location: [facility]  ORS;  Service: Orthopedics;  Laterality: Right;   SUPRACERVICAL ABDOMINAL HYSTERECTOMY  2010   leio/AUB; Dr. Luella Cook   TOTAL HIP ARTHROPLASTY Right 11/12/2018   Procedure: TOTAL HIP ARTHROPLASTY ANTERIOR APPROACH;  Surgeon: Lyndle Herrlich, MD;  Location: [facility] ORS;  Service: Orthopedics;  Laterality: Right;   Patient Active Problem List   Diagnosis Date Noted   Nontraumatic incomplete tear of right rotator cuff 01/03/2023   Right rotator cuff tendonitis 09/11/2022   Iron deficiency anemia due to dietary causes 08/10/2022   Asthma, moderate persistent, poorly-controlled 08/10/2022   Chronic right shoulder pain 05/11/2022   Morbid obesity (HCC) 01/05/2022   Rotator cuff impingement syndrome, right 09/29/2021   Biceps tendinitis, right 09/29/2021   Meralgia paresthetica of right side 03/29/2021   Psoas tendinitis, right hip 03/29/2021   History of total right hip arthroplasty 03/29/2021   Status post total replacement of right hip 02/19/2021   Meralgia paresthetica of right side 02/19/2021   Polyp of ascending colon 02/19/2021   Polyp of sigmoid colon 02/19/2021   Special screening for malignant neoplasms, colon    Polyp of ascending colon    Polyp of sigmoid colon    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 03/23/2020   Pain  in right hip 01/12/2019   Vitamin D deficiency 02/25/2018   Chronic pain 10/24/2015   Hot flashes 10/24/2015   Migraine without aura and without status migrainosus, not intractable 10/24/2015   Metabolic syndrome 10/24/2015   Dyslipidemia 10/24/2015   Allergic rhinitis, seasonal 05/17/2015   Asthma in adult, mild intermittent, with acute exacerbation 08/08/2014   Chronic insomnia 08/08/2014   DDD (degenerative disc disease), lumbar 08/08/2014   Mood disorder (HCC) 08/08/2014   Gastroesophageal reflux disease without esophagitis 08/08/2014   H/O total hysterectomy 08/08/2014   Irritable bowel syndrome with diarrhea 08/08/2014    Osteoarthritis of right hip 08/08/2014   Adult BMI 30+ 08/08/2014   SBO (spina bifida occulta) 08/08/2014    PCP: Alba Cory, MD   REFERRING PROVIDER: Huel Cote, MD orthopedics   REFERRING DIAG: Rt arthroscopic rotator cuff repair   THERAPY DIAG:  Right shoulder pain, unspecified chronicity  Stiffness of right shoulder, not elsewhere classified  Muscle weakness (generalized)  Rationale for Evaluation and Treatment: Rehabilitation  ONSET DATE: 01/03/23  SUBJECTIVE:                                                                                                                                                                                      SUBJECTIVE STATEMENT: Patient reports she is almost out of the sling, can't wait to be done with it.    Hand dominance: Right  PERTINENT HISTORY: Thaddeus Teska is a 52yo F presents to outpatient physical therapy on postop day 4 status post right arthroscopic shoulder debridement of supraspinatus tendon, anterior glenoid labrum, subacromial depression, rotator cuff repair with Dr. Huel Cote.  Patient reports more than 1 year of insidious onset shoulder pain previously followed by sports medicine Dr. Ashley Royalty.  PAIN:  Are you having pain? 3/10 right shoulder: Worst pain was 8 out of 10 while sleeping overnight 2 nights ago.  PRECAUTIONS: Patient in sling immobilizer and nonweightbearing x 2 weeks.  Awaiting updates pending follow-up visit with orthopedics.   WEIGHT BEARING RESTRICTIONS: Yes, right upper extremity nonweightbearing  FALLS:  Has patient fallen in last 6 months?  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 PATIENT  SURVEYS:  FOTO: 4  SENSATION: WNL  POSTURE: WNL  UPPER EXTREMITY ROM:    ROM Right eval Left eval  Shoulder flexion 90   Shoulder extension NT   Shoulder abduction NT   Shoulder internal rotation full  Shoulder external rotation NT   Elbow flexion full   Elbow extension full    Wrist flexion full    Wrist extension full    Wrist ulnar deviation full    Wrist radial deviation full    forearm pronation full    forearm supination full    (Blank rows = not tested)    PALPATION:  Tenderness at the posterior shoulder portal wound while adjusting/donning sling   *PROTOCOL LOCATED IN MEDIA SECTION OF EPIC   TODAY'S TREATMENT:                                                                                                                                         DATE: 01/28/23   Manual therapy: (supine)  Gentle grade 1-2 GH joint mobs (inf/PA/AP) x several min Gentle PROM right shoulder flex < 110 deg x several min- empty end feel with initial mm guarding- improved over time Gentle PROM right shoulder ABD <80 deg  x several min- No pain within this ROM Gentle PROM right shoulder ER- up to 40 deg x several min- no pain   Cryotherapy: applied during therex: Ice to Right shoulder TherEx: -elbow flexion/extension 20x  Putty: -grip strengthening squeezes: x several minutes -finger pinches x several minutes   PATIENT EDUCATION: Education details: See above- extensive education this visit  Person educated: patient  Education method: Research scientist (medical), deliberate practice, positive reinforcement, explicit instruction, establish rules,  Education comprehension: adequate   HOME EXERCISE PROGRAM: Access Code: [email or web address] URL: [email or web address] Date: 01/07/2023 Prepared by: Alvera Novel  Exercises - Seated Cervical Sidebending Stretch  - 1-2 x daily - 3 sets - 30 hold - Seated Assisted Cervical Rotation with Towel  - 1-2 x daily - 3 sets - 30  hold - Sidelying Thoracic Rotation Book Openings- Both sides  - 1 x daily - 1-2 x weekly - 1 sets - 20 reps - 1-2sec hold - Supine Cervical Retraction with Towel  - 2 x daily - 7 x weekly - 1 sets - 15 reps - 3-5sec hold - Shoulder external rotation, scapular retraction RED band  - 1 x daily - 3 x weekly - 3 sets - 15 reps - Standing Bilateral Low Shoulder Row with Anchored Resistance  - 1 x daily - 7 x weekly - 3 sets - 15 reps  ASSESSMENT:  CLINICAL IMPRESSION: Patient tolerates progressive PROM. Putty exercises added to POC at this time. She remains highly motivated for progression of exercises however is still limited by protocol at this time. Will progress once reached clearance per physician protocol.  Patient will benefit from skilled PT services to fully rehabilitate this operative shoulder to allow for full return to PLOF in UE use for ADL, IADL, and fitness/leisure.   OBJECTIVE IMPAIRMENTS: decreased activity tolerance, decreased endurance, decreased knowledge of condition, decreased ROM, decreased strength, increased muscle spasms, impaired flexibility, and improper body mechanics.  ACTIVITY LIMITATIONS: carrying, lifting, bathing, toileting, dressing, self feeding, reach over head, and hygiene/grooming  PARTICIPATION LIMITATIONS: meal prep, cleaning, laundry, driving, shopping, community activity, occupation, and yard work  PERSONAL FACTORS: Age, Behavior pattern, Education, Fitness, Past/current experiences, Profession, Social background, Time since onset of injury/illness/exacerbation, and Transportation are also affecting patient's functional outcome.   REHAB POTENTIAL: Excellent  CLINICAL DECISION MAKING: Stable/uncomplicated  EVALUATION COMPLEXITY: Low   GOALS: Goals reviewed with patient? No  SHORT TERM GOALS: Target date: 02/07/23  Pt to demonstrate Rt shoulder P/ROM flexion to >120 degrees, ABDCT >90 degrees, ER >45 degrees to improve ability to perform ADL/selfcare.   Baseline: Goal status: INITIAL  2.  Pt to report successful HEP performance and successful adherence to postoperative restrictions.  Baseline:  Goal status: INITIAL  3.  Pt to demonstrate 3/5 Rt shoulder MMT to 90 degrees of flexion and 90 degrees of abduction.  Baseline: unable Goal status: INITIAL  LONG TERM GOALS: Target date: 04/07/23  Pt to improve FOTO survey goal by >60 points to indicate a monstrous reduction in difficulty of Rt limb use in a variety of questionable activities.  Baseline: 4 Goal status: INITIAL  2.  Pt to demonstrate 5/5 Rt shoulder flexion, abduction, ER, IR, extension; 5/5 Rt elbow flexion/extension  Baseline:  Goal status: INITIAL  3.  Pt to demonstrate >155 degrees Rt shoulder A/ROM without end-range pain>2/10.  Baseline:  Goal status: INITIAL  4.  Pt to demonstrate ability to perform gym-type exercises of resistance and aerobic natures without pain exacerbation in shoulder to allow for return to regular exercise at DC.  Baseline:  Goal status: INITIAL  PLAN:  PT FREQUENCY: 1-2x/week  PT DURATION: 3 months   PLANNED INTERVENTIONS: 97110-Therapeutic exercises, 97530- Therapeutic activity, [id number]- Neuromuscular re-education, 97535- Self Care, 91478- Manual therapy, 97014- Electrical stimulation (unattended), [phone]- Electrical stimulation (manual), Patient/Family education, and Moist heat  PLAN FOR NEXT SESSION: continue per Shoulder protocol - week 3  8:43 AM, 01/28/23     Precious Bard, PT 01/28/2023, 8:43 AM

## 2023-01-28 ENCOUNTER — Ambulatory Visit: Payer: 59

## 2023-01-28 DIAGNOSIS — M6281 Muscle weakness (generalized): Secondary | ICD-10-CM | POA: Diagnosis not present

## 2023-01-28 DIAGNOSIS — M25511 Pain in right shoulder: Secondary | ICD-10-CM

## 2023-01-28 DIAGNOSIS — M25611 Stiffness of right shoulder, not elsewhere classified: Secondary | ICD-10-CM | POA: Diagnosis not present

## 2023-01-28 DIAGNOSIS — M75111 Incomplete rotator cuff tear or rupture of right shoulder, not specified as traumatic: Secondary | ICD-10-CM | POA: Diagnosis not present

## 2023-01-28 DIAGNOSIS — M25619 Stiffness of unspecified shoulder, not elsewhere classified: Secondary | ICD-10-CM | POA: Diagnosis not present

## 2023-01-31 ENCOUNTER — Ambulatory Visit: Payer: 59

## 2023-02-02 ENCOUNTER — Telehealth: Payer: 59

## 2023-02-02 DIAGNOSIS — L089 Local infection of the skin and subcutaneous tissue, unspecified: Secondary | ICD-10-CM | POA: Diagnosis not present

## 2023-02-02 MED ORDER — CEPHALEXIN 500 MG PO CAPS: 500.0000 mg | ORAL_CAPSULE | Freq: Three times a day (TID) | ORAL | 0 refills | Status: DC

## 2023-02-02 NOTE — Progress Notes (Signed)
 Virtual Visit Consent   Shelby Henson, you are scheduled for a virtual visit with a [facility] provider today. Just as with appointments in the office, your consent must be obtained to participate. Your consent will be active for this visit and any virtual visit you may have with one of our providers in the next 365 days. If you have a MyChart account, a copy of this consent can be sent to you electronically.  As this is a virtual visit, video technology does not allow for your provider to perform a traditional examination. This may limit your provider's ability to fully assess your condition. If your provider identifies any concerns that need to be evaluated in person or the need to arrange testing (such as labs, EKG, etc.), we will make arrangements to do so. Although advances in technology are sophisticated, we cannot ensure that it will always work on either your end or our end. If the connection with a video visit is poor, the visit may have to be switched to a telephone visit. With either a video or telephone visit, we are not always able to ensure that we have a secure connection.  By engaging in this virtual visit, you consent to the provision of healthcare and authorize for your insurance to be billed (if applicable) for the services provided during this visit. Depending on your insurance coverage, you may receive a charge related to this service.  I need to obtain your verbal consent now. Are you willing to proceed with your visit today? Shelby Henson has provided verbal consent on 02/02/2023 for a virtual visit (video or telephone). Shelby Lamp, FNP  Date: 02/02/2023 10:47 AM  Virtual Visit via Video Note   I, Shelby Henson, connected with  Shelby Henson  ([id number], 20-Sep-1970) on 02/02/23 at 11:15 AM EST by a video-enabled telemedicine application and verified that I am speaking with the correct person using two identifiers.  Location: Patient: Virtual Visit Location Patient: Home Provider:  Virtual Visit Location Provider: Home Office   I discussed the limitations of evaluation and management by telemedicine and the availability of in person appointments. The patient expressed understanding and agreed to proceed.    History of Present Illness: Shelby Henson is a 53 y.o. who identifies as a female who was assigned female at birth, and is being seen today for infection of finger. She says she pulled a hangnail anf it got infection. SABRA  HPI: HPI  Problems:  Patient Active Problem List   Diagnosis Date Noted   Nontraumatic incomplete tear of right rotator cuff 01/03/2023   Right rotator cuff tendonitis 09/11/2022   Iron  deficiency anemia due to dietary causes 08/10/2022   Asthma, moderate persistent, poorly-controlled 08/10/2022   Chronic right shoulder pain 05/11/2022   Morbid obesity (HCC) 01/05/2022   Rotator cuff impingement syndrome, right 09/29/2021   Biceps tendinitis, right 09/29/2021   Meralgia paresthetica of right side 03/29/2021   Psoas tendinitis, right hip 03/29/2021   History of total right hip arthroplasty 03/29/2021   Status post total replacement of right hip 02/19/2021   Meralgia paresthetica of right side 02/19/2021   Polyp of ascending colon 02/19/2021   Polyp of sigmoid colon 02/19/2021   Special screening for malignant neoplasms, colon    Polyp of ascending colon    Polyp of sigmoid colon    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 03/23/2020   Pain in right hip 01/12/2019   Vitamin D  deficiency 02/25/2018  Chronic pain 10/24/2015   Hot flashes 10/24/2015   Migraine without aura and without status migrainosus, not intractable 10/24/2015   Metabolic syndrome 10/24/2015   Dyslipidemia 10/24/2015   Allergic rhinitis, seasonal 05/17/2015   Asthma in adult, mild intermittent, with acute exacerbation 08/08/2014   Chronic insomnia 08/08/2014   DDD (degenerative disc disease), lumbar 08/08/2014   Mood disorder (HCC) 08/08/2014    Gastroesophageal reflux disease without esophagitis 08/08/2014   H/O total hysterectomy 08/08/2014   Irritable bowel syndrome with diarrhea 08/08/2014   Osteoarthritis of right hip 08/08/2014   Adult BMI 30+ 08/08/2014   SBO (spina bifida occulta) 08/08/2014    Allergies:  Allergies  Allergen Reactions   Celecoxib Hives   Prednisone      intolerance, makes her angry. Makes her angry   Topamax  [Topiramate ]     Mood changes   Zolpidem     Mood changes   Codeine Rash   Penicillins Rash    Did it involve swelling of the face/tongue/throat, SOB, or low BP? No Did it involve sudden or severe rash/hives, skin peeling, or any reaction on the inside of your mouth or nose? Yes Did you need to seek medical attention at a hospital or doctor's office? Yes When did it last happen? More than 20 years ago       If all above answers are "NO", may proceed with cephalosporin use.    Medications:  Current Outpatient Medications:    cephALEXin  (KEFLEX ) 500 MG capsule, Take 1 capsule (500 mg total) by mouth 3 (three) times daily for 10 days., Disp: 30 capsule, Rfl: 0   acetaminophen  (TYLENOL ) 500 MG tablet, Take 1,000 mg by mouth every 6 (six) hours as needed for moderate pain (pain score 4-6)., Disp: , Rfl:    albuterol  (PROVENTIL ) (2.5 MG/3ML) 0.083% nebulizer solution, Take 3 mLs (2.5 mg total) by nebulization every 6 (six) hours as needed for wheezing or shortness of breath, Disp: 75 mL, Rfl: 1   Albuterol -Budesonide  (AIRSUPRA ) 90-80 MCG/ACT AERO, Inhale 2 puffs into the lungs 4 (four) times daily as needed., Disp: 10.7 g, Rfl: 1   aspirin  EC 325 MG tablet, Take 1 tablet (325 mg total) by mouth daily. (Patient taking differently: Take 325 mg by mouth daily. For after the surgery), Disp: 14 tablet, Rfl: 0   desvenlafaxine  (PRISTIQ ) 50 MG 24 hr tablet, Take 1 tablet (50 mg total) by mouth daily., Disp: 90 tablet, Rfl: 1   diclofenac  Sodium (VOLTAREN ) 1 % GEL, Apply 2 grams topically 4 (four) times  daily., Disp: 100 g, Rfl: 1   famotidine  (PEPCID ) 40 MG tablet, Take 1 tablet (40 mg total) by mouth daily., Disp: 90 tablet, Rfl: 1   Fluticasone -Umeclidin-Vilant (TRELEGY ELLIPTA ) 200-62.5-25 MCG/ACT AEPB, Inhale 1 puff into the lungs daily. (Patient taking differently: Inhale 1 puff into the lungs at bedtime.), Disp: 60 each, Rfl: 6   gabapentin  (NEURONTIN ) 300 MG capsule, Take 3 capsules (900 mg total) by mouth at bedtime., Disp: 270 capsule, Rfl: 1   Galcanezumab -gnlm (EMGALITY ) 120 MG/ML SOAJ, Inject 1 mL into the skin every 30 (thirty) days., Disp: 1 mL, Rfl: 5   lidocaine  (LIDODERM ) 5 %, Place 2 patches onto the skin daily. Remove & Discard patch within 12 hours or as directed by MD (Patient not taking: Reported on 12/25/2022), Disp: 60 patch, Rfl: 2   oxyCODONE  (ROXICODONE ) 5 MG immediate release tablet, Take 1 tablet (5 mg total) by mouth every 4 (four) hours as needed for severe pain (pain score  7-10) or breakthrough pain., Disp: 15 tablet, Rfl: 0   pantoprazole  (PROTONIX ) 40 MG tablet, Take 1 tablet (40 mg total) by mouth daily. (Patient taking differently: Take 40 mg by mouth daily as needed.), Disp: 30 tablet, Rfl: 1   prazosin  (MINIPRESS ) 2 MG capsule, Take 1 capsule (2 mg total) by mouth at bedtime., Disp: 90 capsule, Rfl: 0  Observations/Objective: Patient is well-developed, well-nourished in no acute distress.  Resting comfortably  at home.  Head is normocephalic, atraumatic.  No labored breathing.  Speech is clear and coherent with logical content.  Patient is alert and oriented at baseline.    Assessment and Plan: 1. Infection of finger (Primary)  Keep clean and dry, antibiotic ointment, UC if sx worsen.   Follow Up Instructions: I discussed the assessment and treatment plan with the patient. The patient was provided an opportunity to ask questions and all were answered. The patient agreed with the plan and demonstrated an understanding of the instructions.  A copy of  instructions were sent to the patient via MyChart unless otherwise noted below.     The patient was advised to call back or seek an in-person evaluation if the symptoms worsen or if the condition fails to improve as anticipated.    Shraddha Lebron, FNP

## 2023-02-02 NOTE — Patient Instructions (Signed)

## 2023-02-03 NOTE — Therapy (Signed)
 OUTPATIENT PHYSICAL THERAPY SHOULDER TREATMENT   Patient Name: Shelby Henson MRN: [id number] DOB:1970/04/24, 53 y.o., female Today's Date: 02/04/2023  END OF SESSION:  PT End of Session - 02/04/23 0801     Visit Number 5    Number of Visits 24    Date for PT Re-Evaluation 04/07/23    Authorization Type Shelby Henson [employer] FOC[employer]    Authorization Time Period 01/07/23-04/07/23    Progress Note Due on Visit 10    PT Start Time 0800    PT Stop Time 0844    PT Time Calculation (min) 44 min    Activity Tolerance Patient tolerated treatment well;Patient limited by pain    Behavior During Therapy [facility] for tasks assessed/performed                Past Medical History:  Diagnosis Date   Anemia    Asthma    well controlled   Colon polyps    Complication of anesthesia    DDD (degenerative disc disease), lumbar    Depression    GERD (gastroesophageal reflux disease)    occ   Iron  deficiency anemia    Migraine    Morbid obesity (HCC)    Osteoarthritis    Osteoarthritis of right hip    PONV (postoperative nausea and vomiting)    with c-section x 1   Right rotator cuff tear 11/2022   Vitamin D  deficiency    Past Surgical History:  Procedure Laterality Date   BREAST CYST ASPIRATION Left    CESAREAN SECTION  1990   CESAREAN SECTION  1993   CESAREAN SECTION  1996   COLONOSCOPY N/A 09/22/2020   Procedure: COLONOSCOPY;  Surgeon: Shelby Keene NOVAK, MD;  Location: [facility] ENDOSCOPY;  Service: Endoscopy;  Laterality: N/A;   LAPAROSCOPY  2018   LEEP  2005   benign pathology; Dr. Rolm   SHOULDER ACROMIOPLASTY Right 01/03/2023   Procedure: RIGHT SHOULDER ACROMIOPLASTY;  Surgeon: Shelby Standing, MD;  Location: [facility] ORS;  Service: Orthopedics;  Laterality: Right;   SHOULDER ARTHROSCOPY WITH ROTATOR CUFF REPAIR AND OPEN BICEPS TENODESIS Right 01/03/2023   Procedure: RIGHT SHOULDER ARTHROSCOPY WITH ROTATOR CUFF REPAIR AND  BICEPS TENODESIS;  Surgeon: Shelby Standing, MD;  Location: [facility]  ORS;  Service: Orthopedics;  Laterality: Right;   SUPRACERVICAL ABDOMINAL HYSTERECTOMY  2010   leio/AUB; Dr. Rolm   TOTAL HIP ARTHROPLASTY Right 11/12/2018   Procedure: TOTAL HIP ARTHROPLASTY ANTERIOR APPROACH;  Surgeon: Shelby Lynwood SAUNDERS, MD;  Location: [facility] ORS;  Service: Orthopedics;  Laterality: Right;   Patient Active Problem List   Diagnosis Date Noted   Nontraumatic incomplete tear of right rotator cuff 01/03/2023   Right rotator cuff tendonitis 09/11/2022   Iron  deficiency anemia due to dietary causes 08/10/2022   Asthma, moderate persistent, poorly-controlled 08/10/2022   Chronic right shoulder pain 05/11/2022   Morbid obesity (HCC) 01/05/2022   Rotator cuff impingement syndrome, right 09/29/2021   Biceps tendinitis, right 09/29/2021   Meralgia paresthetica of right side 03/29/2021   Psoas tendinitis, right hip 03/29/2021   History of total right hip arthroplasty 03/29/2021   Status post total replacement of right hip 02/19/2021   Meralgia paresthetica of right side 02/19/2021   Polyp of ascending colon 02/19/2021   Polyp of sigmoid colon 02/19/2021   Special screening for malignant neoplasms, colon    Polyp of ascending colon    Polyp of sigmoid colon    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 03/23/2020  Pain in right hip 01/12/2019   Vitamin D  deficiency 02/25/2018   Chronic pain 10/24/2015   Hot flashes 10/24/2015   Migraine without aura and without status migrainosus, not intractable 10/24/2015   Metabolic syndrome 10/24/2015   Dyslipidemia 10/24/2015   Allergic rhinitis, seasonal 05/17/2015   Asthma in adult, mild intermittent, with acute exacerbation 08/08/2014   Chronic insomnia 08/08/2014   DDD (degenerative disc disease), lumbar 08/08/2014   Mood disorder (HCC) 08/08/2014   Gastroesophageal reflux disease without esophagitis 08/08/2014   H/O total hysterectomy 08/08/2014   Irritable bowel syndrome with diarrhea 08/08/2014    Osteoarthritis of right hip 08/08/2014   Adult BMI 30+ 08/08/2014   SBO (spina bifida occulta) 08/08/2014    PCP: Shelby Loron, MD   REFERRING PROVIDER: Elspeth Parker, MD orthopedics   REFERRING DIAG: Rt arthroscopic rotator cuff repair   THERAPY DIAG:  Right shoulder pain, unspecified chronicity  Stiffness of right shoulder, not elsewhere classified  Muscle weakness (generalized)  Limited range of motion (ROM) of shoulder  Rationale for Evaluation and Treatment: Rehabilitation  ONSET DATE: 01/03/23  SUBJECTIVE:                                                                                                                                                                                      SUBJECTIVE STATEMENT: Patient has removed sling , is now driving. is now week 4.   Hand dominance: Right  PERTINENT HISTORY: Shelby Henson is a 53yo F presents to outpatient physical therapy on postop day 4 status post right arthroscopic shoulder debridement of supraspinatus tendon, anterior glenoid labrum, subacromial depression, rotator cuff repair with Dr. Elspeth Henson.  Patient reports more than 1 year of insidious onset shoulder pain previously followed by sports medicine Dr. Alvia.  PAIN:  Are you having pain? 3/10 right shoulder: Worst pain was 8 out of 10 while sleeping overnight 2 nights ago.  PRECAUTIONS: Patient in sling immobilizer and nonweightbearing x 2 weeks.  Awaiting updates pending follow-up visit with orthopedics.   WEIGHT BEARING RESTRICTIONS: Yes, right upper extremity nonweightbearing  FALLS:  Has patient fallen in last 6 months?  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 PATIENT SURVEYS:  FOTO: 4  SENSATION: WNL  POSTURE: WNL  UPPER EXTREMITY ROM:    ROM Right eval Left eval  Shoulder flexion 90   Shoulder extension NT   Shoulder abduction NT   Shoulder  internal rotation full   Shoulder external rotation NT   Elbow flexion full   Elbow extension full    Wrist flexion full    Wrist extension full    Wrist ulnar deviation full    Wrist radial deviation full    forearm pronation full    forearm supination full    (Blank rows = not tested)    PALPATION:  Tenderness at the posterior shoulder portal wound while adjusting/donning sling   *PROTOCOL LOCATED IN MEDIA SECTION OF EPIC   TODAY'S TREATMENT:                                                                                                                                         DATE: 02/04/23   Manual therapy: (supine)  Gentle grade 1-2 GH joint mobs (inf/PA/AP) x several min Gentle PROM right shoulder flex < 110 deg x several min- empty end feel with initial mm guarding- improved over time Gentle PROM right shoulder ABD <80 deg  x several min- No pain within this ROM Gentle PROM right shoulder ER- up to 40 deg x several min- no pain   Cryotherapy: applied during therex: Ice to Right shoulder  TherEx:  Introduce AAROM 10x 20 second holds; cues for leading with the LUE; on incline plinth -use of dowel: flexion, abduction, ER   Seated UE ranger:  -flexion/extension 10x -abduction/adduction 10x -circles 10x, clockwise/counter 10x  -use of dowel: flexion, abduction, ER      PATIENT EDUCATION: Education details: See above- extensive education this visit  Person educated: patient  Education method: collaborative learning, deliberate practice, positive reinforcement, explicit instruction, establish rules,  Education comprehension: adequate   HOME EXERCISE PROGRAM: Access Code: [email or web address] URL: [email or web address] Date:  01/07/2023 Prepared by: Peggye Linear  Exercises - Seated Cervical Sidebending Stretch  - 1-2 x daily - 3 sets - 30 hold - Seated Assisted Cervical Rotation with Towel  - 1-2 x daily - 3 sets - 30 hold - Sidelying Thoracic Rotation Book Openings- Both sides  - 1 x daily - 1-2 x weekly - 1 sets - 20 reps - 1-2sec hold - Supine Cervical Retraction with Towel  - 2 x daily - 7 x weekly - 1 sets - 15 reps - 3-5sec hold - Shoulder external rotation, scapular retraction RED band  - 1 x daily - 3 x weekly - 3 sets - 15 reps - Henson Bilateral Low Shoulder Row with Anchored Resistance  - 1 x daily - 7 x weekly - 3 sets - 15 reps  Access Code: [email or web address] URL: [email or web address] Date: 02/04/2023 Prepared by: Allina Riches  Exercises - Supine Shoulder Flexion AAROM with Dowel  - 1 x daily - 7 x weekly - 2 sets - 10 reps - 5 hold - Supine Shoulder Abduction AAROM with Dowel  - 1 x daily - 7 x weekly - 2 sets - 10 reps -  5 hold - Supine Shoulder External Rotation with Dowel  - 1 x daily - 7 x weekly - 2 sets - 10 reps - 5 hold - Seated Scapular Retraction  - 1 x daily - 7 x weekly - 2 sets - 10 reps - 5 hold  ASSESSMENT:  CLINICAL IMPRESSION: Patient now in protocol where she can initiate AAROM. Patient tolerates AAROM well with no pain increase. PROM performed for increased range with patient reporting relief of symptoms.  Patient will benefit from skilled PT services to fully rehabilitate this operative shoulder to allow for full return to PLOF in UE use for ADL, IADL, and fitness/leisure.   OBJECTIVE IMPAIRMENTS: decreased activity tolerance, decreased endurance, decreased knowledge of condition, decreased ROM, decreased strength, increased muscle spasms, impaired flexibility, and improper body mechanics.   ACTIVITY LIMITATIONS: carrying, lifting, bathing, toileting, dressing, self feeding, reach over head, and hygiene/grooming  PARTICIPATION LIMITATIONS: meal prep, cleaning,  laundry, driving, shopping, community activity, occupation, and yard work  PERSONAL FACTORS: Age, Behavior pattern, Education, Fitness, Past/current experiences, Profession, Social background, Time since onset of injury/illness/exacerbation, and Transportation are also affecting patient's functional outcome.   REHAB POTENTIAL: Excellent  CLINICAL DECISION MAKING: Stable/uncomplicated  EVALUATION COMPLEXITY: Low   GOALS: Goals reviewed with patient? No  SHORT TERM GOALS: Target date: 02/07/23  Pt to demonstrate Rt shoulder P/ROM flexion to >120 degrees, ABDCT >90 degrees, ER >45 degrees to improve ability to perform ADL/selfcare.  Baseline: Goal status: INITIAL  2.  Pt to report successful HEP performance and successful adherence to postoperative restrictions.  Baseline:  Goal status: INITIAL  3.  Pt to demonstrate 3/5 Rt shoulder MMT to 90 degrees of flexion and 90 degrees of abduction.  Baseline: unable Goal status: INITIAL  LONG TERM GOALS: Target date: 04/07/23  Pt to improve FOTO survey goal by >60 points to indicate a monstrous reduction in difficulty of Rt limb use in a variety of questionable activities.  Baseline: 4 Goal status: INITIAL  2.  Pt to demonstrate 5/5 Rt shoulder flexion, abduction, ER, IR, extension; 5/5 Rt elbow flexion/extension  Baseline:  Goal status: INITIAL  3.  Pt to demonstrate >155 degrees Rt shoulder A/ROM without end-range pain>2/10.  Baseline:  Goal status: INITIAL  4.  Pt to demonstrate ability to perform gym-type exercises of resistance and aerobic natures without pain exacerbation in shoulder to allow for return to regular exercise at DC.  Baseline:  Goal status: INITIAL  PLAN:  PT FREQUENCY: 1-2x/week  PT DURATION: 3 months   PLANNED INTERVENTIONS: 97110-Therapeutic exercises, 97530- Therapeutic activity, [id number]- Neuromuscular re-education, 97535- Self Care, [id number]- Manual therapy, [id number]- Electrical stimulation (unattended), [id number]-  Electrical stimulation (manual), Patient/Family education, and Moist heat  PLAN FOR NEXT SESSION: continue per Shoulder protocol - week 4  8:49 AM, 02/04/23     Eldean Klatt, PT 02/04/2023, 8:49 AM

## 2023-02-04 ENCOUNTER — Ambulatory Visit: Payer: 59 | Attending: Orthopaedic Surgery

## 2023-02-04 DIAGNOSIS — M25611 Stiffness of right shoulder, not elsewhere classified: Secondary | ICD-10-CM | POA: Insufficient documentation

## 2023-02-04 DIAGNOSIS — M25511 Pain in right shoulder: Secondary | ICD-10-CM | POA: Insufficient documentation

## 2023-02-04 DIAGNOSIS — M6281 Muscle weakness (generalized): Secondary | ICD-10-CM | POA: Insufficient documentation

## 2023-02-04 DIAGNOSIS — M25619 Stiffness of unspecified shoulder, not elsewhere classified: Secondary | ICD-10-CM | POA: Diagnosis not present

## 2023-02-06 ENCOUNTER — Ambulatory Visit: Payer: 59

## 2023-02-06 NOTE — Therapy (Signed)
 OUTPATIENT PHYSICAL THERAPY SHOULDER TREATMENT   Patient Name: Shelby Henson MRN: [id number] DOB:July 28, 1970, 53 y.o., female Today's Date: 02/07/2023  END OF SESSION:  PT End of Session - 02/07/23 0919     Visit Number 6    Number of Visits 24    Date for PT Re-Evaluation 04/07/23    Authorization Type [employer] FOC[employer]    Authorization Time Period 01/07/23-04/07/23    Progress Note Due on Visit 10    PT Start Time 0921    PT Stop Time 1005    PT Time Calculation (min) 44 min    Activity Tolerance Patient tolerated treatment well;Patient limited by pain    Behavior During Therapy [facility] for tasks assessed/performed                 Past Medical History:  Diagnosis Date   Anemia    Asthma    well controlled   Colon polyps    Complication of anesthesia    DDD (degenerative disc disease), lumbar    Depression    GERD (gastroesophageal reflux disease)    occ   Iron  deficiency anemia    Migraine    Morbid obesity (HCC)    Osteoarthritis    Osteoarthritis of right hip    PONV (postoperative nausea and vomiting)    with c-section x 1   Right rotator cuff tear 11/2022   Vitamin D  deficiency    Past Surgical History:  Procedure Laterality Date   BREAST CYST ASPIRATION Left    CESAREAN SECTION  1990   CESAREAN SECTION  1993   CESAREAN SECTION  1996   COLONOSCOPY N/A 09/22/2020   Procedure: COLONOSCOPY;  Surgeon: Janalyn Keene NOVAK, MD;  Location: [facility] ENDOSCOPY;  Service: Endoscopy;  Laterality: N/A;   LAPAROSCOPY  2018   LEEP  2005   benign pathology; Dr. Rolm   SHOULDER ACROMIOPLASTY Right 01/03/2023   Procedure: RIGHT SHOULDER ACROMIOPLASTY;  Surgeon: Genelle Standing, MD;  Location: [facility] ORS;  Service: Orthopedics;  Laterality: Right;   SHOULDER ARTHROSCOPY WITH ROTATOR CUFF REPAIR AND OPEN BICEPS TENODESIS Right 01/03/2023   Procedure: RIGHT SHOULDER ARTHROSCOPY WITH ROTATOR CUFF REPAIR AND  BICEPS TENODESIS;  Surgeon: Genelle Standing, MD;  Location: [facility]  ORS;  Service: Orthopedics;  Laterality: Right;   SUPRACERVICAL ABDOMINAL HYSTERECTOMY  2010   leio/AUB; Dr. Rolm   TOTAL HIP ARTHROPLASTY Right 11/12/2018   Procedure: TOTAL HIP ARTHROPLASTY ANTERIOR APPROACH;  Surgeon: Leora Lynwood SAUNDERS, MD;  Location: [facility] ORS;  Service: Orthopedics;  Laterality: Right;   Patient Active Problem List   Diagnosis Date Noted   Nontraumatic incomplete tear of right rotator cuff 01/03/2023   Right rotator cuff tendonitis 09/11/2022   Iron  deficiency anemia due to dietary causes 08/10/2022   Asthma, moderate persistent, poorly-controlled 08/10/2022   Chronic right shoulder pain 05/11/2022   Morbid obesity (HCC) 01/05/2022   Rotator cuff impingement syndrome, right 09/29/2021   Biceps tendinitis, right 09/29/2021   Meralgia paresthetica of right side 03/29/2021   Psoas tendinitis, right hip 03/29/2021   History of total right hip arthroplasty 03/29/2021   Status post total replacement of right hip 02/19/2021   Meralgia paresthetica of right side 02/19/2021   Polyp of ascending colon 02/19/2021   Polyp of sigmoid colon 02/19/2021   Special screening for malignant neoplasms, colon    Polyp of ascending colon    Polyp of sigmoid colon    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 03/23/2020  Pain in right hip 01/12/2019   Vitamin D  deficiency 02/25/2018   Chronic pain 10/24/2015   Hot flashes 10/24/2015   Migraine without aura and without status migrainosus, not intractable 10/24/2015   Metabolic syndrome 10/24/2015   Dyslipidemia 10/24/2015   Allergic rhinitis, seasonal 05/17/2015   Asthma in adult, mild intermittent, with acute exacerbation 08/08/2014   Chronic insomnia 08/08/2014   DDD (degenerative disc disease), lumbar 08/08/2014   Mood disorder (HCC) 08/08/2014   Gastroesophageal reflux disease without esophagitis 08/08/2014   H/O total hysterectomy 08/08/2014   Irritable bowel syndrome with diarrhea 08/08/2014    Osteoarthritis of right hip 08/08/2014   Adult BMI 30+ 08/08/2014   SBO (spina bifida occulta) 08/08/2014    PCP: Dorette Loron, MD   REFERRING PROVIDER: Elspeth Parker, MD orthopedics   REFERRING DIAG: Rt arthroscopic rotator cuff repair   THERAPY DIAG:  Right shoulder pain, unspecified chronicity  Stiffness of right shoulder, not elsewhere classified  Muscle weakness (generalized)  Rationale for Evaluation and Treatment: Rehabilitation  ONSET DATE: 01/03/23  SUBJECTIVE:                                                                                                                                                                                      SUBJECTIVE STATEMENT:  Patient reports extensive soreness after last session/over the past few days.   Hand dominance: Right  PERTINENT HISTORY: Tanajah Boulter is a 53yo F presents to outpatient physical therapy on postop day 4 status post right arthroscopic shoulder debridement of supraspinatus tendon, anterior glenoid labrum, subacromial depression, rotator cuff repair with Dr. Elspeth Parker.  Patient reports more than 1 year of insidious onset shoulder pain previously followed by sports medicine Dr. Alvia.  PAIN:  Are you having pain? 3/10 right shoulder: Worst pain was 8 out of 10 while sleeping overnight 2 nights ago.  PRECAUTIONS: Patient in sling immobilizer and nonweightbearing x 2 weeks.  Awaiting updates pending follow-up visit with orthopedics.   WEIGHT BEARING RESTRICTIONS: Yes, right upper extremity nonweightbearing  FALLS:  Has patient fallen in last 6 months?  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 PATIENT  SURVEYS:  FOTO: 4  SENSATION: WNL  POSTURE: WNL  UPPER EXTREMITY ROM:    ROM Right eval Left eval  Shoulder flexion 90   Shoulder extension NT   Shoulder abduction NT   Shoulder internal rotation full   Shoulder external rotation  NT   Elbow flexion full   Elbow extension full    Wrist flexion full    Wrist extension full    Wrist ulnar deviation full    Wrist radial deviation full    forearm pronation full    forearm supination full    (Blank rows = not tested)    PALPATION:  Tenderness at the posterior shoulder portal wound while adjusting/donning sling   *PROTOCOL LOCATED IN MEDIA SECTION OF EPIC   TODAY'S TREATMENT:                                                                                                                                         DATE: 02/07/23   Manual therapy: (supine)  Gentle grade 1-2 GH joint mobs (inf/PA/AP) x several min Gentle PROM right shoulder flex < 110 deg x several min- empty end feel with initial mm guarding- improved over time Gentle PROM right shoulder ABD <80 deg  x several min- No pain within this ROM Gentle PROM right shoulder ER- up to 40 deg x several min- no pain   Cryotherapy: applied during therex: Ice to Right shoulder  TherEx:  Introduce AAROM 10x 20 second holds; cues for leading with the LUE; on incline plinth -use of dowel: flexion, abduction, ER   Seated UE ranger:  -flexion/extension 10x -abduction/adduction 10x -circles 10x, clockwise/counter 10x       PATIENT EDUCATION: Education details: See above- extensive education this visit  Person educated: patient  Education method: research scientist (medical), deliberate practice, positive reinforcement, explicit instruction, establish rules,  Education comprehension: adequate   HOME EXERCISE PROGRAM: Access Code: [email or web address] URL: [email or web address] Date: 01/07/2023 Prepared by: Peggye Linear  Exercises - Seated Cervical Sidebending  Stretch  - 1-2 x daily - 3 sets - 30 hold - Seated Assisted Cervical Rotation with Towel  - 1-2 x daily - 3 sets - 30 hold - Sidelying Thoracic Rotation Book Openings- Both sides  - 1 x daily - 1-2 x weekly - 1 sets - 20 reps - 1-2sec hold - Supine Cervical Retraction with Towel  - 2 x daily - 7 x weekly - 1 sets - 15 reps - 3-5sec hold - Shoulder external rotation, scapular retraction RED band  - 1 x daily - 3 x weekly - 3 sets - 15 reps - Standing Bilateral Low Shoulder Row with Anchored Resistance  - 1 x daily - 7 x weekly - 3 sets - 15 reps  Access Code: [email or web address] URL: [email or web address] Date: 02/04/2023 Prepared by: Pericles Carmicheal  Exercises - Supine Shoulder Flexion AAROM with Dowel  - 1 x daily - 7 x weekly - 2 sets - 10 reps - 5 hold - Supine Shoulder Abduction AAROM with Dowel  - 1 x daily - 7 x weekly - 2 sets - 10 reps - 5 hold - Supine Shoulder External Rotation with Dowel  - 1 x  daily - 7 x weekly - 2 sets - 10 reps - 5 hold - Seated Scapular Retraction  - 1 x daily - 7 x weekly - 2 sets - 10 reps - 5 hold  ASSESSMENT:  CLINICAL IMPRESSION: Patient educated on need to reduce excessive movement now that she is out of sling. Patient consistently performing AROM quick movements unintentionally with surgical limb. Educated on need to be careful of movements this week.  Patient will benefit from skilled PT services to fully rehabilitate this operative shoulder to allow for full return to PLOF in UE use for ADL, IADL, and fitness/leisure.   OBJECTIVE IMPAIRMENTS: decreased activity tolerance, decreased endurance, decreased knowledge of condition, decreased ROM, decreased strength, increased muscle spasms, impaired flexibility, and improper body mechanics.   ACTIVITY LIMITATIONS: carrying, lifting, bathing, toileting, dressing, self feeding, reach over head, and hygiene/grooming  PARTICIPATION LIMITATIONS: meal prep, cleaning, laundry, driving, shopping, community  activity, occupation, and yard work  PERSONAL FACTORS: Age, Behavior pattern, Education, Fitness, Past/current experiences, Profession, Social background, Time since onset of injury/illness/exacerbation, and Transportation are also affecting patient's functional outcome.   REHAB POTENTIAL: Excellent  CLINICAL DECISION MAKING: Stable/uncomplicated  EVALUATION COMPLEXITY: Low   GOALS: Goals reviewed with patient? No  SHORT TERM GOALS: Target date: 02/07/23  Pt to demonstrate Rt shoulder P/ROM flexion to >120 degrees, ABDCT >90 degrees, ER >45 degrees to improve ability to perform ADL/selfcare.  Baseline: Goal status: INITIAL  2.  Pt to report successful HEP performance and successful adherence to postoperative restrictions.  Baseline:  Goal status: INITIAL  3.  Pt to demonstrate 3/5 Rt shoulder MMT to 90 degrees of flexion and 90 degrees of abduction.  Baseline: unable Goal status: INITIAL  LONG TERM GOALS: Target date: 04/07/23  Pt to improve FOTO survey goal by >60 points to indicate a monstrous reduction in difficulty of Rt limb use in a variety of questionable activities.  Baseline: 4 Goal status: INITIAL  2.  Pt to demonstrate 5/5 Rt shoulder flexion, abduction, ER, IR, extension; 5/5 Rt elbow flexion/extension  Baseline:  Goal status: INITIAL  3.  Pt to demonstrate >155 degrees Rt shoulder A/ROM without end-range pain>2/10.  Baseline:  Goal status: INITIAL  4.  Pt to demonstrate ability to perform gym-type exercises of resistance and aerobic natures without pain exacerbation in shoulder to allow for return to regular exercise at DC.  Baseline:  Goal status: INITIAL  PLAN:  PT FREQUENCY: 1-2x/week  PT DURATION: 3 months   PLANNED INTERVENTIONS: 97110-Therapeutic exercises, 97530- Therapeutic activity, [id number]- Neuromuscular re-education, 97535- Self Care, 02859- Manual therapy, 97014- Electrical stimulation (unattended), [id number]- Electrical stimulation (manual),  Patient/Family education, and Moist heat  PLAN FOR NEXT SESSION: continue per Shoulder protocol - week 4  10:10 AM, 02/07/23     Kyzen Horn, PT 02/07/2023, 10:10 AM

## 2023-02-07 ENCOUNTER — Ambulatory Visit: Payer: 59

## 2023-02-07 DIAGNOSIS — M6281 Muscle weakness (generalized): Secondary | ICD-10-CM | POA: Diagnosis not present

## 2023-02-07 DIAGNOSIS — M25611 Stiffness of right shoulder, not elsewhere classified: Secondary | ICD-10-CM | POA: Diagnosis not present

## 2023-02-07 DIAGNOSIS — M25511 Pain in right shoulder: Secondary | ICD-10-CM

## 2023-02-07 DIAGNOSIS — M25619 Stiffness of unspecified shoulder, not elsewhere classified: Secondary | ICD-10-CM | POA: Diagnosis not present

## 2023-02-07 NOTE — Therapy (Signed)
 OUTPATIENT PHYSICAL THERAPY SHOULDER TREATMENT   Patient Name: Shelby Henson MRN: [id number] DOB:[birth year], 53 y.o., female Today's Date: 02/11/2023  END OF SESSION:  PT End of Session - 02/11/23 0754     Visit Number 7    Number of Visits 24    Date for PT Re-Evaluation 04/07/23    Authorization Type Shelby Henson [employer]OC[employer]    Authorization Time Period 01/07/23-04/07/23    Progress Note Due on Visit 10    PT Start Time 0800    PT Stop Time 0845    PT Time Calculation (min) 45 min    Activity Tolerance Patient tolerated treatment well;Patient limited by pain    Behavior During Therapy Shelby Henson for tasks assessed/performed                  Past Medical History:  Diagnosis Date   Anemia    Asthma    well controlled   Colon polyps    Complication of anesthesia    DDD (degenerative disc disease), lumbar    Depression    GERD (gastroesophageal reflux disease)    occ   Iron  deficiency anemia    Migraine    Morbid obesity (HCC)    Osteoarthritis    Osteoarthritis of right hip    PONV (postoperative nausea and vomiting)    with c-section x 1   Right rotator cuff tear 11/2022   Vitamin D  deficiency    Past Surgical History:  Procedure Laterality Date   BREAST CYST ASPIRATION Left    CESAREAN SECTION  1990   CESAREAN SECTION  1993   CESAREAN SECTION  1996   COLONOSCOPY N/A 09/22/2020   Procedure: COLONOSCOPY;  Surgeon: Shelby Keene NOVAK, MD;  Location: [facility];  Service: Endoscopy;  Laterality: N/A;   LAPAROSCOPY  2018   LEEP  2005   benign pathology; Shelby Henson   SHOULDER ACROMIOPLASTY Right 01/03/2023   Procedure: RIGHT SHOULDER ACROMIOPLASTY;  Surgeon: Shelby Standing, MD;  Location: [facility];  Service: Orthopedics;  Laterality: Right;   SHOULDER ARTHROSCOPY WITH ROTATOR CUFF REPAIR AND OPEN BICEPS TENODESIS Right 01/03/2023   Procedure: RIGHT SHOULDER ARTHROSCOPY WITH ROTATOR CUFF REPAIR AND  BICEPS TENODESIS;  Surgeon: Shelby Standing, MD;  Location:  [facility];  Service: Orthopedics;  Laterality: Right;   SUPRACERVICAL ABDOMINAL HYSTERECTOMY  2010   leio/AUB; Shelby Henson   TOTAL HIP ARTHROPLASTY Right 11/12/2018   Procedure: TOTAL HIP ARTHROPLASTY ANTERIOR APPROACH;  Surgeon: Shelby Lynwood SAUNDERS, MD;  Location: [facility];  Service: Orthopedics;  Laterality: Right;   Patient Active Problem List   Diagnosis Date Noted   Nontraumatic incomplete tear of right rotator cuff 01/03/2023   Right rotator cuff tendonitis 09/11/2022   Iron  deficiency anemia due to dietary causes 08/10/2022   Asthma, moderate persistent, poorly-controlled 08/10/2022   Chronic right shoulder pain 05/11/2022   Morbid obesity (HCC) 01/05/2022   Rotator cuff impingement syndrome, right 09/29/2021   Biceps tendinitis, right 09/29/2021   Meralgia paresthetica of right side 03/29/2021   Psoas tendinitis, right hip 03/29/2021   History of total right hip arthroplasty 03/29/2021   Status post total replacement of right hip 02/19/2021   Meralgia paresthetica of right side 02/19/2021   Polyp of ascending colon 02/19/2021   Polyp of sigmoid colon 02/19/2021   Special screening for malignant neoplasms, colon    Polyp of ascending colon    Polyp of sigmoid colon    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 03/23/2020  Pain in right hip 01/12/2019   Vitamin D  deficiency 02/25/2018   Chronic pain 10/24/2015   Hot flashes 10/24/2015   Migraine without aura and without status migrainosus, not intractable 10/24/2015   Metabolic syndrome 10/24/2015   Dyslipidemia 10/24/2015   Allergic rhinitis, seasonal 05/17/2015   Asthma in adult, mild intermittent, with acute exacerbation 08/08/2014   Chronic insomnia 08/08/2014   DDD (degenerative disc disease), lumbar 08/08/2014   Mood disorder (HCC) 08/08/2014   Gastroesophageal reflux disease without esophagitis 08/08/2014   H/O total hysterectomy 08/08/2014   Irritable bowel syndrome with diarrhea 08/08/2014    Osteoarthritis of right hip 08/08/2014   Adult BMI 30+ 08/08/2014   SBO (spina bifida occulta) 08/08/2014    PCP: Shelby Loron, MD   REFERRING PROVIDER: Elspeth Parker, MD orthopedics   REFERRING DIAG: Rt arthroscopic rotator cuff repair   THERAPY DIAG:  Right shoulder pain, unspecified chronicity  Stiffness of right shoulder, not elsewhere classified  Muscle weakness (generalized)  Limited range of motion (ROM) of shoulder  Rationale for Evaluation and Treatment: Rehabilitation  ONSET DATE: 01/03/23  SUBJECTIVE:                                                                                                                                                                                      SUBJECTIVE STATEMENT: Today is patients first day attempting to be back at work.   Hand dominance: Right  PERTINENT HISTORY: Shelby Henson is a 53yo F presents to outpatient physical therapy on postop day 4 status post right arthroscopic shoulder debridement of supraspinatus tendon, anterior glenoid labrum, subacromial depression, rotator cuff repair with Dr. Elspeth Henson.  Patient reports more than 1 year of insidious onset shoulder pain previously followed by sports medicine Shelby Henson.  PAIN:  Are you having pain? 3/10 right shoulder: Worst pain was 8 out of 10 while sleeping overnight 2 nights ago.  PRECAUTIONS: Patient in sling immobilizer and nonweightbearing x 2 weeks.  Awaiting updates pending follow-up visit with orthopedics.   WEIGHT BEARING RESTRICTIONS: Yes, right upper extremity nonweightbearing  FALLS:  Has patient fallen in last 6 months?  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 PATIENT SURVEYS:  FOTO: 4  SENSATION: WNL  POSTURE: WNL  UPPER EXTREMITY ROM:    ROM Right eval Left eval  Shoulder flexion 90   Shoulder extension NT   Shoulder abduction NT   Shoulder internal  rotation full   Shoulder external rotation NT   Elbow flexion full   Elbow extension full    Wrist flexion full    Wrist extension full    Wrist ulnar deviation full    Wrist radial deviation full    forearm pronation full    forearm supination full    (Blank rows = not tested)    PALPATION:  Tenderness at the posterior shoulder portal wound while adjusting/donning sling   *PROTOCOL LOCATED IN MEDIA SECTION OF EPIC   TODAY'S TREATMENT:                                                                                                                                         DATE: 02/11/23   Manual therapy: (supine)  Gentle grade 1-2 GH joint mobs (inf/PA/AP) x several min Gentle PROM right shoulder flex  improved over time Gentle PROM right shoulder x several min Gentle PROM right shoulder ER- up to 40 deg x several min- no pain  Bicep lengthening stretch with pressure x multiple reps  Cryotherapy: applied during therex: Ice to Right shoulder  TherEx:  Seated UE ranger:  -flexion/extension 10x -abduction/adduction 10x -circles 10x, clockwise/counter 10x -alphabet   Finger walk:  -flexion 5x -abduction 5x  Wall slide with towel 5x   Desk stretches and posture education x 8 minutes      PATIENT EDUCATION: Education details: See above- extensive education this visit  Person educated: patient  Education method: research scientist (medical), deliberate practice, positive reinforcement, explicit instruction, establish rules,  Education comprehension: adequate   HOME EXERCISE PROGRAM: Access Code: [email or web address] URL: [email or web address] Date: 01/07/2023 Prepared by: Peggye Linear  Exercises - Seated Cervical Sidebending Stretch  -  1-2 x daily - 3 sets - 30 hold - Seated Assisted Cervical Rotation with Towel  - 1-2 x daily - 3 sets - 30 hold - Sidelying Thoracic Rotation Book Openings- Both sides  - 1 x daily - 1-2 x weekly - 1 sets - 20 reps - 1-2sec hold - Supine Cervical Retraction with Towel  - 2 x daily - 7 x weekly - 1 sets - 15 reps - 3-5sec hold - Shoulder external rotation, scapular retraction RED band  - 1 x daily - 3 x weekly - 3 sets - 15 reps - Henson Bilateral Low Shoulder Row with Anchored Resistance  - 1 x daily - 7 x weekly - 3 sets - 15 reps  Access Code: [email or web address] URL: [email or web address] Date: 02/04/2023 Prepared by: Doaa Kendzierski  Exercises - Supine Shoulder Flexion AAROM with Dowel  - 1 x daily - 7 x weekly - 2 sets - 10 reps - 5 hold - Supine Shoulder Abduction AAROM with Dowel  - 1 x daily - 7 x weekly - 2 sets - 10 reps - 5 hold - Supine Shoulder External Rotation with Dowel  - 1 x daily - 7 x  weekly - 2 sets - 10 reps - 5 hold - Seated Scapular Retraction  - 1 x daily - 7 x weekly - 2 sets - 10 reps - 5 hold  ASSESSMENT:  CLINICAL IMPRESSION: Progressive AAROM ROM interventions tolerated well with patient reporting feelings of relief/stretching with increased range. No pain present this session. Icing applied and desk orientation for posture.  Patient will benefit from skilled PT services to fully rehabilitate this operative shoulder to allow for full return to PLOF in UE use for ADL, IADL, and fitness/leisure.   OBJECTIVE IMPAIRMENTS: decreased activity tolerance, decreased endurance, decreased knowledge of condition, decreased ROM, decreased strength, increased muscle spasms, impaired flexibility, and improper body mechanics.   ACTIVITY LIMITATIONS: carrying, lifting, bathing, toileting, dressing, self feeding, reach over head, and hygiene/grooming  PARTICIPATION LIMITATIONS: meal prep, cleaning, laundry, driving, shopping, community activity, occupation, and yard  work  PERSONAL FACTORS: Age, Behavior pattern, Education, Fitness, Past/current experiences, Profession, Social background, Time since onset of injury/illness/exacerbation, and Transportation are also affecting patient's functional outcome.   REHAB POTENTIAL: Excellent  CLINICAL DECISION MAKING: Stable/uncomplicated  EVALUATION COMPLEXITY: Low   GOALS: Goals reviewed with patient? No  SHORT TERM GOALS: Target date: 02/07/23  Pt to demonstrate Rt shoulder P/ROM flexion to >120 degrees, ABDCT >90 degrees, ER >45 degrees to improve ability to perform ADL/selfcare.  Baseline: Goal status: INITIAL  2.  Pt to report successful HEP performance and successful adherence to postoperative restrictions.  Baseline:  Goal status: INITIAL  3.  Pt to demonstrate 3/5 Rt shoulder MMT to 90 degrees of flexion and 90 degrees of abduction.  Baseline: unable Goal status: INITIAL  LONG TERM GOALS: Target date: 04/07/23  Pt to improve FOTO survey goal by >60 points to indicate a monstrous reduction in difficulty of Rt limb use in a variety of questionable activities.  Baseline: 4 Goal status: INITIAL  2.  Pt to demonstrate 5/5 Rt shoulder flexion, abduction, ER, IR, extension; 5/5 Rt elbow flexion/extension  Baseline:  Goal status: INITIAL  3.  Pt to demonstrate >155 degrees Rt shoulder A/ROM without end-range pain>2/10.  Baseline:  Goal status: INITIAL  4.  Pt to demonstrate ability to perform gym-type exercises of resistance and aerobic natures without pain exacerbation in shoulder to allow for return to regular exercise at DC.  Baseline:  Goal status: INITIAL  PLAN:  PT FREQUENCY: 1-2x/week  PT DURATION: 3 months   PLANNED INTERVENTIONS: 97110-Therapeutic exercises, 97530- Therapeutic activity, [id number]- Neuromuscular re-education, 97535- Self Care, [id number]- Manual therapy, [id number]- Electrical stimulation (unattended), [id number]- Electrical stimulation (manual), Patient/Family education, and Moist  heat  PLAN FOR NEXT SESSION: continue per Shoulder protocol - week 4  9:09 AM, 02/11/23     Chiyoko Torrico, PT 02/11/2023, 9:09 AM

## 2023-02-11 ENCOUNTER — Other Ambulatory Visit (HOSPITAL_BASED_OUTPATIENT_CLINIC_OR_DEPARTMENT_OTHER): Payer: Self-pay | Admitting: Orthopaedic Surgery

## 2023-02-11 ENCOUNTER — Encounter (HOSPITAL_BASED_OUTPATIENT_CLINIC_OR_DEPARTMENT_OTHER): Payer: Self-pay | Admitting: Orthopaedic Surgery

## 2023-02-11 ENCOUNTER — Ambulatory Visit: Payer: 59

## 2023-02-11 ENCOUNTER — Other Ambulatory Visit: Payer: Self-pay

## 2023-02-11 DIAGNOSIS — M25511 Pain in right shoulder: Secondary | ICD-10-CM | POA: Diagnosis not present

## 2023-02-11 DIAGNOSIS — M25619 Stiffness of unspecified shoulder, not elsewhere classified: Secondary | ICD-10-CM | POA: Diagnosis not present

## 2023-02-11 DIAGNOSIS — M25611 Stiffness of right shoulder, not elsewhere classified: Secondary | ICD-10-CM | POA: Diagnosis not present

## 2023-02-11 DIAGNOSIS — M6281 Muscle weakness (generalized): Secondary | ICD-10-CM

## 2023-02-11 MED ORDER — TRAMADOL HCL 50 MG PO TABS
50.0000 mg | ORAL_TABLET | Freq: Four times a day (QID) | ORAL | 0 refills | Status: DC | PRN
  Filled 2023-02-11: qty 30, 8d supply, fill #0

## 2023-02-12 ENCOUNTER — Encounter: Payer: Self-pay | Admitting: Family Medicine

## 2023-02-12 ENCOUNTER — Other Ambulatory Visit: Payer: Self-pay | Admitting: Family Medicine

## 2023-02-12 ENCOUNTER — Other Ambulatory Visit: Payer: Self-pay

## 2023-02-12 DIAGNOSIS — B3731 Acute candidiasis of vulva and vagina: Secondary | ICD-10-CM

## 2023-02-12 MED ORDER — FLUCONAZOLE 150 MG PO TABS
150.0000 mg | ORAL_TABLET | ORAL | 0 refills | Status: DC
  Filled 2023-02-12: qty 3, 6d supply, fill #0

## 2023-02-12 NOTE — Therapy (Signed)
 OUTPATIENT PHYSICAL THERAPY SHOULDER TREATMENT   Patient Name: Shelby Henson MRN: [id number] DOB:September 06, 1970, 53 y.o., female Today's Date: 02/13/2023  END OF SESSION:  PT End of Session - 02/13/23 0757     Visit Number 8    Number of Visits 24    Date for PT Re-Evaluation 04/07/23    Authorization Type Arlin Benes [employer] F[employer]    Authorization Time Period 01/07/23-04/07/23    Progress Note Due on Visit 10    PT Start Time 0800    PT Stop Time 0844    PT Time Calculation (min) 44 min    Activity Tolerance Patient tolerated treatment well;Patient limited by pain    Behavior During Therapy [facility] for tasks assessed/performed                   Past Medical History:  Diagnosis Date   Anemia    Asthma    well controlled   Colon polyps    Complication of anesthesia    DDD (degenerative disc disease), lumbar    Depression    GERD (gastroesophageal reflux disease)    occ   Iron  deficiency anemia    Migraine    Morbid obesity (HCC)    Osteoarthritis    Osteoarthritis of right hip    PONV (postoperative nausea and vomiting)    with c-section x 1   Right rotator cuff tear 11/2022   Vitamin D  deficiency    Past Surgical History:  Procedure Laterality Date   BREAST CYST ASPIRATION Left    CESAREAN SECTION  1990   CESAREAN SECTION  1993   CESAREAN SECTION  1996   COLONOSCOPY N/A 09/22/2020   Procedure: COLONOSCOPY;  Surgeon: Irby Mannan, MD;  Location: [facility];  Service: Endoscopy;  Laterality: N/A;   LAPAROSCOPY  2018   LEEP  2005   benign pathology; Dr. Laney Piper   SHOULDER ACROMIOPLASTY Right 01/03/2023   Procedure: RIGHT SHOULDER ACROMIOPLASTY;  Surgeon: Wilhelmenia Harada, MD;  Location: [facility];  Service: Orthopedics;  Laterality: Right;   SHOULDER ARTHROSCOPY WITH ROTATOR CUFF REPAIR AND OPEN BICEPS TENODESIS Right 01/03/2023   Procedure: RIGHT SHOULDER ARTHROSCOPY WITH ROTATOR CUFF REPAIR AND  BICEPS TENODESIS;  Surgeon: Wilhelmenia Harada, MD;  Location:  [facility];  Service: Orthopedics;  Laterality: Right;   SUPRACERVICAL ABDOMINAL HYSTERECTOMY  2010   leio/AUB; Dr. Laney Piper   TOTAL HIP ARTHROPLASTY Right 11/12/2018   Procedure: TOTAL HIP ARTHROPLASTY ANTERIOR APPROACH;  Surgeon: Jerlyn Moons, MD;  Location: [facility];  Service: Orthopedics;  Laterality: Right;   Patient Active Problem List   Diagnosis Date Noted   Nontraumatic incomplete tear of right rotator cuff 01/03/2023   Right rotator cuff tendonitis 09/11/2022   Iron  deficiency anemia due to dietary causes 08/10/2022   Asthma, moderate persistent, poorly-controlled 08/10/2022   Chronic right shoulder pain 05/11/2022   Morbid obesity (HCC) 01/05/2022   Rotator cuff impingement syndrome, right 09/29/2021   Biceps tendinitis, right 09/29/2021   Meralgia paresthetica of right side 03/29/2021   Psoas tendinitis, right hip 03/29/2021   History of total right hip arthroplasty 03/29/2021   Status post total replacement of right hip 02/19/2021   Meralgia paresthetica of right side 02/19/2021   Polyp of ascending colon 02/19/2021   Polyp of sigmoid colon 02/19/2021   Special screening for malignant neoplasms, colon    Polyp of ascending colon    Polyp of sigmoid colon    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  03/23/2020   Pain in right hip 01/12/2019   Vitamin D  deficiency 02/25/2018   Chronic pain 10/24/2015   Hot flashes 10/24/2015   Migraine without aura and without status migrainosus, not intractable 10/24/2015   Metabolic syndrome 10/24/2015   Dyslipidemia 10/24/2015   Allergic rhinitis, seasonal 05/17/2015   Asthma in adult, mild intermittent, with acute exacerbation 08/08/2014   Chronic insomnia 08/08/2014   DDD (degenerative disc disease), lumbar 08/08/2014   Mood disorder (HCC) 08/08/2014   Gastroesophageal reflux disease without esophagitis 08/08/2014   H/O total hysterectomy 08/08/2014   Irritable bowel syndrome with diarrhea 08/08/2014    Osteoarthritis of right hip 08/08/2014   Adult BMI 30+ 08/08/2014   SBO (spina bifida occulta) 08/08/2014    PCP: Arleen Lacer, MD   REFERRING PROVIDER: Wilhelmenia Harada, MD orthopedics   REFERRING DIAG: Rt arthroscopic rotator cuff repair   THERAPY DIAG:  Right shoulder pain, unspecified chronicity  Stiffness of right shoulder, not elsewhere classified  Muscle weakness (generalized)  Limited range of motion (ROM) of shoulder  Rationale for Evaluation and Treatment: Rehabilitation  ONSET DATE: 01/03/23  SUBJECTIVE:                                                                                                                                                                                      SUBJECTIVE STATEMENT: Patient reports her bicep has felt sore. Has been working full time this week.   Hand dominance: Right  PERTINENT HISTORY: Shelby Henson is a 53yo F presents to outpatient physical therapy on postop day 4 status post right arthroscopic shoulder debridement of supraspinatus tendon, anterior glenoid labrum, subacromial depression, rotator cuff repair with Dr. Wilhelmenia Harada.  Patient reports more than 1 year of insidious onset shoulder pain previously followed by sports medicine Dr. Augustus Ledger.  PAIN:  Are you having pain? 3/10 right shoulder: Worst pain was 8 out of 10 while sleeping overnight 2 nights ago.  PRECAUTIONS: Patient in sling immobilizer and nonweightbearing x 2 weeks.  Awaiting updates pending follow-up visit with orthopedics.   WEIGHT BEARING RESTRICTIONS: Yes, right upper extremity nonweightbearing  FALLS:  Has patient fallen in last 6 months?  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 PATIENT SURVEYS:  FOTO: 4  SENSATION: WNL  POSTURE: WNL  UPPER EXTREMITY ROM:    ROM Right eval Left eval  Shoulder flexion 90   Shoulder extension NT   Shoulder  abduction NT   Shoulder internal rotation full   Shoulder external rotation NT   Elbow flexion full   Elbow extension full    Wrist flexion full    Wrist extension full    Wrist ulnar deviation full    Wrist radial deviation full    forearm pronation full    forearm supination full    (Blank rows = not tested)    PALPATION:  Tenderness at the posterior shoulder portal wound while adjusting/donning sling   *PROTOCOL LOCATED IN MEDIA SECTION OF EPIC   TODAY'S TREATMENT:                                                                                                                                         DATE: 02/13/23   Manual therapy: (supine)  Gentle grade 1-2 GH joint mobs (inf/PA/AP) x several min Gentle PROM right shoulder flex  improved over time Gentle PROM right shoulder x several min Gentle PROM right shoulder ER- up to 40 deg x several min- no pain  Bicep lengthening stretch with pressure x multiple reps  Cryotherapy: applied during therex: Ice to Right shoulder  TherEx:  Finger walk:  -flexion 8x -abduction 8x  AROM supine: flexion 10x; 2 sets to 45 degrees; abduction 10x; 2 sets 45 degrees, IR/ER to belly to neutral 10x; 2 sets     PATIENT EDUCATION: Education details: See above- extensive education this visit  Person educated: patient  Education method: collaborative learning, deliberate practice, positive reinforcement, explicit instruction, establish rules,  Education comprehension: adequate   HOME EXERCISE PROGRAM: Access Code: [email or web address] URL: [email or web address] Date: 01/07/2023 Prepared by: Atlee Blanks  Exercises - Seated Cervical Sidebending Stretch  - 1-2 x daily - 3 sets - 30 hold - Seated Assisted Cervical  Rotation with Towel  - 1-2 x daily - 3 sets - 30 hold - Sidelying Thoracic Rotation Book Openings- Both sides  - 1 x daily - 1-2 x weekly - 1 sets - 20 reps - 1-2sec hold - Supine Cervical Retraction with Towel  - 2 x daily - 7 x weekly - 1 sets - 15 reps - 3-5sec hold - Shoulder external rotation, scapular retraction RED band  - 1 x daily - 3 x weekly - 3 sets - 15 reps - Standing Bilateral Low Shoulder Row with Anchored Resistance  - 1 x daily - 7 x weekly - 3 sets - 15 reps  Access Code: [email or web address] URL: [email or web address] Date: 02/04/2023 Prepared by: Hanif Radin  Exercises - Supine Shoulder Flexion AAROM with Dowel  - 1 x daily - 7 x weekly - 2 sets - 10 reps - 5 hold - Supine Shoulder Abduction AAROM with Dowel  - 1 x daily - 7 x weekly - 2 sets - 10 reps - 5 hold - Supine Shoulder External Rotation with Dowel  - 1 x daily - 7 x weekly -  2 sets - 10 reps - 5 hold - Seated Scapular Retraction  - 1 x daily - 7 x weekly - 2 sets - 10 reps - 5 hold  ASSESSMENT:  CLINICAL IMPRESSION:   Patient introduced to AROM this session with no pain increase. Modification to 45 degrees only while in supine to decrease aggravation. Patient is highly motivated throughout session and eager to progress according to protocol.  Patient will benefit from skilled PT services to fully rehabilitate this operative shoulder to allow for full return to PLOF in UE use for ADL, IADL, and fitness/leisure.   OBJECTIVE IMPAIRMENTS: decreased activity tolerance, decreased endurance, decreased knowledge of condition, decreased ROM, decreased strength, increased muscle spasms, impaired flexibility, and improper body mechanics.   ACTIVITY LIMITATIONS: carrying, lifting, bathing, toileting, dressing, self feeding, reach over head, and hygiene/grooming  PARTICIPATION LIMITATIONS: meal prep, cleaning, laundry, driving, shopping, community activity, occupation, and yard work  PERSONAL FACTORS: Age, Behavior  pattern, Education, Fitness, Past/current experiences, Profession, Social background, Time since onset of injury/illness/exacerbation, and Transportation are also affecting patient's functional outcome.   REHAB POTENTIAL: Excellent  CLINICAL DECISION MAKING: Stable/uncomplicated  EVALUATION COMPLEXITY: Low   GOALS: Goals reviewed with patient? No  SHORT TERM GOALS: Target date: 02/07/23  Pt to demonstrate Rt shoulder P/ROM flexion to >120 degrees, ABDCT >90 degrees, ER >45 degrees to improve ability to perform ADL/selfcare.  Baseline: Goal status: INITIAL  2.  Pt to report successful HEP performance and successful adherence to postoperative restrictions.  Baseline:  Goal status: INITIAL  3.  Pt to demonstrate 3/5 Rt shoulder MMT to 90 degrees of flexion and 90 degrees of abduction.  Baseline: unable Goal status: INITIAL  LONG TERM GOALS: Target date: 04/07/23  Pt to improve FOTO survey goal by >60 points to indicate a monstrous reduction in difficulty of Rt limb use in a variety of questionable activities.  Baseline: 4 Goal status: INITIAL  2.  Pt to demonstrate 5/5 Rt shoulder flexion, abduction, ER, IR, extension; 5/5 Rt elbow flexion/extension  Baseline:  Goal status: INITIAL  3.  Pt to demonstrate >155 degrees Rt shoulder A/ROM without end-range pain>2/10.  Baseline:  Goal status: INITIAL  4.  Pt to demonstrate ability to perform gym-type exercises of resistance and aerobic natures without pain exacerbation in shoulder to allow for return to regular exercise at DC.  Baseline:  Goal status: INITIAL  PLAN:  PT FREQUENCY: 1-2x/week  PT DURATION: 3 months   PLANNED INTERVENTIONS: 97110-Therapeutic exercises, 97530- Therapeutic activity, [id number]- Neuromuscular re-education, 97535- Self Care, 82956- Manual therapy, 97014- Electrical stimulation (unattended), [id number]- Electrical stimulation (manual), Patient/Family education, and Moist heat  PLAN FOR NEXT SESSION: continue  per Shoulder protocol - week 4  8:45 AM, 02/13/23     Sherice Ijames, PT 02/13/2023, 8:45 AM

## 2023-02-13 ENCOUNTER — Ambulatory Visit: Payer: 59

## 2023-02-13 DIAGNOSIS — M25619 Stiffness of unspecified shoulder, not elsewhere classified: Secondary | ICD-10-CM

## 2023-02-13 DIAGNOSIS — M6281 Muscle weakness (generalized): Secondary | ICD-10-CM

## 2023-02-13 DIAGNOSIS — M25511 Pain in right shoulder: Secondary | ICD-10-CM

## 2023-02-13 DIAGNOSIS — M25611 Stiffness of right shoulder, not elsewhere classified: Secondary | ICD-10-CM | POA: Diagnosis not present

## 2023-02-14 ENCOUNTER — Ambulatory Visit (INDEPENDENT_AMBULATORY_CARE_PROVIDER_SITE_OTHER): Payer: 59 | Admitting: Orthopaedic Surgery

## 2023-02-14 DIAGNOSIS — M75111 Incomplete rotator cuff tear or rupture of right shoulder, not specified as traumatic: Secondary | ICD-10-CM

## 2023-02-14 NOTE — Progress Notes (Signed)
Post Operative Evaluation    Procedure/Date of Surgery: Right rotator cuff repair with collagen patch augmentation 12/5   Interval History:  . Presents 6 weeks status post the above procedure.  Overall she is doing well.  She is continuing to improve with physical therapy.  She has been working on passive range of motion.  She does have some soreness in the front of the shoulder that goes down the biceps  PMH/PSH/Family History/Social History/Meds/Allergies:    Past Medical History:  Diagnosis Date   Anemia    Asthma    well controlled   Colon polyps    Complication of anesthesia    DDD (degenerative disc disease), lumbar    Depression    GERD (gastroesophageal reflux disease)    occ   Iron deficiency anemia    Migraine    Morbid obesity (HCC)    Osteoarthritis    Osteoarthritis of right hip    PONV (postoperative nausea and vomiting)    with c-section x 1   Right rotator cuff tear 11/2022   Vitamin D deficiency    Past Surgical History:  Procedure Laterality Date   BREAST CYST ASPIRATION Left    CESAREAN SECTION  1990   CESAREAN SECTION  1993   CESAREAN SECTION  1996   COLONOSCOPY N/A 09/22/2020   Procedure: COLONOSCOPY;  Surgeon: Pasty Spillers, MD;  Location: [facility];  Service: Endoscopy;  Laterality: N/A;   LAPAROSCOPY  2018   LEEP  2005   benign pathology; Dr. Luella Cook   SHOULDER ACROMIOPLASTY Right 01/03/2023   Procedure: RIGHT SHOULDER ACROMIOPLASTY;  Surgeon: Huel Cote, MD;  Location: [facility];  Service: Orthopedics;  Laterality: Right;   SHOULDER ARTHROSCOPY WITH ROTATOR CUFF REPAIR AND OPEN BICEPS TENODESIS Right 01/03/2023   Procedure: RIGHT SHOULDER ARTHROSCOPY WITH ROTATOR CUFF REPAIR AND  BICEPS TENODESIS;  Surgeon: Huel Cote, MD;  Location: [facility];  Service: Orthopedics;  Laterality: Right;   SUPRACERVICAL ABDOMINAL HYSTERECTOMY  2010   leio/AUB; Dr. Luella Cook   TOTAL HIP ARTHROPLASTY Right  11/12/2018   Procedure: TOTAL HIP ARTHROPLASTY ANTERIOR APPROACH;  Surgeon: Lyndle Herrlich, MD;  Location: [facility];  Service: Orthopedics;  Laterality: Right;   Social History   Socioeconomic History   Marital status: Divorced    Spouse name: Not on file   Number of children: 3   Years of education: Not on file   Highest education level: Associate degree: occupational, Scientist, product/process development, or vocational program  Occupational History   Not on file  Tobacco Use   Smoking status: Never   Smokeless tobacco: Never  Vaping Use   Vaping status: Never Used  Substance and Sexual Activity   Alcohol use: Yes    Alcohol/week: 0.0 standard drinks of alcohol    Comment: occasionally   Drug use: No   Sexual activity: Yes    Birth control/protection: Surgical    Comment: Hysterectomy  Other Topics Concern   Not on file  Social History Narrative   Not on file   Social Drivers of Health   Financial Resource Strain: Low Risk  (12/07/2022)   Overall Financial Resource Strain (CARDIA)    Difficulty of Paying Living Expenses: Not very hard  Food Insecurity: Patient Declined (12/07/2022)   Hunger Vital Sign    Worried About Programme researcher, broadcasting/film/video in  the Last Year: Patient declined    Ran Out of Food in the Last Year: Patient declined  Transportation Needs: No Transportation Needs (12/07/2022)   PRAPARE - Administrator, Civil Service (Medical): No    Lack of Transportation (Non-Medical): No  Physical Activity: Inactive (12/07/2022)   Exercise Vital Sign    Days of Exercise per Week: 0 days    Minutes of Exercise per Session: 90 min  Stress: Stress Concern Present (12/07/2022)   [employer] of Occupational Health - Occupational Stress Questionnaire    Feeling of Stress : Rather much  Social Connections: Unknown (12/07/2022)   Social Connection and Isolation Panel [NHANES]    Frequency of Communication with Friends and Family: More than three times a week    Frequency of Social  Gatherings with Friends and Family: More than three times a week    Attends Religious Services: Patient declined    Database administrator or Organizations: No    Attends Engineer, structural: Not on file    Marital Status: Divorced   Family History  Problem Relation Age of Onset   Depression Mother    Bipolar disorder Brother    Colon cancer Maternal Grandfather        not sure of age   Breast cancer Neg Hx    Allergies  Allergen Reactions   Celecoxib Hives   Prednisone     intolerance, makes her angry. Makes her angry   Topamax [Topiramate]     Mood changes   Zolpidem     Mood changes   Codeine Rash   Penicillins Rash    Did it involve swelling of the face/tongue/throat, SOB, or low BP? No Did it involve sudden or severe rash/hives, skin peeling, or any reaction on the inside of your mouth or nose? Yes Did you need to seek medical attention at a hospital or doctor's office? Yes When did it last happen? More than 20 years ago       If all above answers are "NO", may proceed with cephalosporin use.    Current Outpatient Medications  Medication Sig Dispense Refill   acetaminophen (TYLENOL) 500 MG tablet Take 1,000 mg by mouth every 6 (six) hours as needed for moderate pain (pain score 4-6).     albuterol (PROVENTIL) (2.5 MG/3ML) 0.083% nebulizer solution Take 3 mLs (2.5 mg total) by nebulization every 6 (six) hours as needed for wheezing or shortness of breath 75 mL 1   Albuterol-Budesonide (AIRSUPRA) 90-80 MCG/ACT AERO Inhale 2 puffs into the lungs 4 (four) times daily as needed. 10.7 g 1   aspirin EC 325 MG tablet Take 1 tablet (325 mg total) by mouth daily. (Patient taking differently: Take 325 mg by mouth daily. For after the surgery) 14 tablet 0   desvenlafaxine (PRISTIQ) 50 MG 24 hr tablet Take 1 tablet (50 mg total) by mouth daily. 90 tablet 1   diclofenac Sodium (VOLTAREN) 1 % GEL Apply 2 grams topically 4 (four) times daily. 100 g 1   famotidine (PEPCID) 40  MG tablet Take 1 tablet (40 mg total) by mouth daily. 90 tablet 1   fluconazole (DIFLUCAN) 150 MG tablet Take 1 tablet (150 mg total) by mouth every other day. 3 tablet 0   Fluticasone-Umeclidin-Vilant (TRELEGY ELLIPTA) 200-62.5-25 MCG/ACT AEPB Inhale 1 puff into the lungs daily. (Patient taking differently: Inhale 1 puff into the lungs at bedtime.) 60 each 6   gabapentin (NEURONTIN) 300 MG capsule Take 3 capsules (  900 mg total) by mouth at bedtime. 270 capsule 1   Galcanezumab-gnlm (EMGALITY) 120 MG/ML SOAJ Inject 1 mL into the skin every 30 (thirty) days. 1 mL 5   lidocaine (LIDODERM) 5 % Place 2 patches onto the skin daily. Remove & Discard patch within 12 hours or as directed by MD (Patient not taking: Reported on 12/25/2022) 60 patch 2   oxyCODONE (ROXICODONE) 5 MG immediate release tablet Take 1 tablet (5 mg total) by mouth every 4 (four) hours as needed for severe pain (pain score 7-10) or breakthrough pain. 15 tablet 0   pantoprazole (PROTONIX) 40 MG tablet Take 1 tablet (40 mg total) by mouth daily. (Patient taking differently: Take 40 mg by mouth daily as needed.) 30 tablet 1   prazosin (MINIPRESS) 2 MG capsule Take 1 capsule (2 mg total) by mouth at bedtime. 90 capsule 0   traMADol (ULTRAM) 50 MG tablet Take 1 tablet (50 mg total) by mouth every 6 (six) hours as needed. 30 tablet 0   No current facility-administered medications for this visit.   No results found.  Review of Systems:   A ROS was performed including pertinent positives and negatives as documented in the HPI.   Musculoskeletal Exam:    There were no vitals taken for this visit.  Right shoulder incisions are well-appearing without erythema or drainage.  Passive range of motion as well as active range of motion is to 100 degrees in the standing position against gravity.  External rotation at side is 30 degrees 2+ radial pulse.  Distal neurosensory exam is intact.  Sling is well-fitting after adjustment  Imaging:       I personally reviewed and interpreted the radiographs.   Assessment:   6-week status post right shoulder rotator cuff repair with collagen patch augmentation.  Overall doing well.  At this time she may progress to active range of motion.  Overall she is doing quite well.  I will plan to see her back in 6 weeks for a MyChart video visit Plan :    -Return to clinic 6 weeks for reassessment      I personally saw and evaluated the patient, and participated in the management and treatment plan.  Huel Cote, MD Attending Physician, Orthopedic Surgery  This document was dictated using Dragon voice recognition software. A reasonable attempt at proof reading has been made to minimize errors.

## 2023-02-14 NOTE — Therapy (Signed)
OUTPATIENT PHYSICAL THERAPY SHOULDER TREATMENT   Patient Name: Shelby Henson MRN: [id number] DOB:1970/11/17, 53 y.o., female Today's Date: 02/18/2023  END OF SESSION:  PT End of Session - 02/18/23 0720     Visit Number 9    Number of Visits 24    Date for PT Re-Evaluation 04/07/23    Authorization Type [facility] [employer] F[employer]    Authorization Time Period 01/07/23-04/07/23    Progress Note Due on Visit 10    PT Start Time 0716    PT Stop Time 0800    PT Time Calculation (min) 44 min    Activity Tolerance Patient tolerated treatment well;Patient limited by pain    Behavior During Therapy [facility] for tasks assessed/performed                    Past Medical History:  Diagnosis Date   Anemia    Asthma    well controlled   Colon polyps    Complication of anesthesia    DDD (degenerative disc disease), lumbar    Depression    GERD (gastroesophageal reflux disease)    occ   Iron deficiency anemia    Migraine    Morbid obesity (HCC)    Osteoarthritis    Osteoarthritis of right hip    PONV (postoperative nausea and vomiting)    with c-section x 1   Right rotator cuff tear 11/2022   Vitamin D deficiency    Past Surgical History:  Procedure Laterality Date   BREAST CYST ASPIRATION Left    CESAREAN SECTION  1990   CESAREAN SECTION  1993   CESAREAN SECTION  1996   COLONOSCOPY N/A 09/22/2020   Procedure: COLONOSCOPY;  Surgeon: Pasty Spillers, MD;  Location: [facility];  Service: Endoscopy;  Laterality: N/A;   LAPAROSCOPY  2018   LEEP  2005   benign pathology; Dr. Luella Cook   SHOULDER ACROMIOPLASTY Right 01/03/2023   Procedure: RIGHT SHOULDER ACROMIOPLASTY;  Surgeon: Huel Cote, MD;  Location: [facility];  Service: Orthopedics;  Laterality: Right;   SHOULDER ARTHROSCOPY WITH ROTATOR CUFF REPAIR AND OPEN BICEPS TENODESIS Right 01/03/2023   Procedure: RIGHT SHOULDER ARTHROSCOPY WITH ROTATOR CUFF REPAIR AND  BICEPS TENODESIS;  Surgeon: Huel Cote, MD;  Location:  [facility];  Service: Orthopedics;  Laterality: Right;   SUPRACERVICAL ABDOMINAL HYSTERECTOMY  2010   leio/AUB; Dr. Luella Cook   TOTAL HIP ARTHROPLASTY Right 11/12/2018   Procedure: TOTAL HIP ARTHROPLASTY ANTERIOR APPROACH;  Surgeon: Lyndle Herrlich, MD;  Location: [facility];  Service: Orthopedics;  Laterality: Right;   Patient Active Problem List   Diagnosis Date Noted   Nontraumatic incomplete tear of right rotator cuff 01/03/2023   Right rotator cuff tendonitis 09/11/2022   Iron deficiency anemia due to dietary causes 08/10/2022   Asthma, moderate persistent, poorly-controlled 08/10/2022   Chronic right shoulder pain 05/11/2022   Morbid obesity (HCC) 01/05/2022   Rotator cuff impingement syndrome, right 09/29/2021   Biceps tendinitis, right 09/29/2021   Meralgia paresthetica of right side 03/29/2021   Psoas tendinitis, right hip 03/29/2021   History of total right hip arthroplasty 03/29/2021   Status post total replacement of right hip 02/19/2021   Meralgia paresthetica of right side 02/19/2021   Polyp of ascending colon 02/19/2021   Polyp of sigmoid colon 02/19/2021   Special screening for malignant neoplasms, colon    Polyp of ascending colon    Polyp of sigmoid colon    COVID-19 long hauler manifesting chronic loss of smell and  taste 03/23/2020   Pain in right hip 01/12/2019   Vitamin D deficiency 02/25/2018   Chronic pain 10/24/2015   Hot flashes 10/24/2015   Migraine without aura and without status migrainosus, not intractable 10/24/2015   Metabolic syndrome 10/24/2015   Dyslipidemia 10/24/2015   Allergic rhinitis, seasonal 05/17/2015   Asthma in adult, mild intermittent, with acute exacerbation 08/08/2014   Chronic insomnia 08/08/2014   DDD (degenerative disc disease), lumbar 08/08/2014   Mood disorder (HCC) 08/08/2014   Gastroesophageal reflux disease without esophagitis 08/08/2014   H/O total hysterectomy 08/08/2014   Irritable bowel syndrome with diarrhea 08/08/2014    Osteoarthritis of right hip 08/08/2014   Adult BMI 30+ 08/08/2014   SBO (spina bifida occulta) 08/08/2014    PCP: Alba Cory, MD   REFERRING PROVIDER: Huel Cote, MD orthopedics   REFERRING DIAG: Rt arthroscopic rotator cuff repair   THERAPY DIAG:  Right shoulder pain, unspecified chronicity  Stiffness of right shoulder, not elsewhere classified  Muscle weakness (generalized)  Limited range of motion (ROM) of shoulder  Rationale for Evaluation and Treatment: Rehabilitation  ONSET DATE: 01/03/23  SUBJECTIVE:                                                                                                                                                                                      SUBJECTIVE STATEMENT: Patient reports she had a lot of pain on Saturday night but is  better today. Has started gentle AROM at home.   Hand dominance: Right  PERTINENT HISTORY: Shelby Henson is a 53yo F presents to outpatient physical therapy on postop day 4 status post right arthroscopic shoulder debridement of supraspinatus tendon, anterior glenoid labrum, subacromial depression, rotator cuff repair with Dr. Huel Cote.  Patient reports more than 1 year of insidious onset shoulder pain previously followed by sports medicine Dr. Ashley Royalty.  PAIN:  Are you having pain? 3/10 right shoulder: Worst pain was 8 out of 10 while sleeping overnight 2 nights ago.  PRECAUTIONS: Patient in sling immobilizer and nonweightbearing x 2 weeks.  Awaiting updates pending follow-up visit with orthopedics.   WEIGHT BEARING RESTRICTIONS: Yes, right upper extremity nonweightbearing  FALLS:  Has patient fallen in last 6 months?  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 PATIENT SURVEYS:  FOTO: 4  SENSATION: WNL  POSTURE: WNL  UPPER EXTREMITY ROM:    ROM Right eval Left eval  Shoulder flexion  90   Shoulder extension NT   Shoulder abduction NT   Shoulder internal rotation full   Shoulder external rotation NT   Elbow flexion full   Elbow extension full    Wrist flexion full    Wrist extension full    Wrist ulnar deviation full    Wrist radial deviation full    forearm pronation full    forearm supination full    (Blank rows = not tested)    PALPATION:  Tenderness at the posterior shoulder portal wound while adjusting/donning sling   *PROTOCOL LOCATED IN MEDIA SECTION OF EPIC   TODAY'S TREATMENT:                                                                                                                                         DATE: 02/18/23   Manual therapy: (supine)  Gentle grade 1-2 GH joint mobs (inf/PA/AP) x several min Gentle PROM right shoulder flex  improved over time Gentle PROM right shoulder x several min Gentle PROM right shoulder ER- up to 40 deg x several min- no pain  Bicep lengthening stretch with pressure x multiple reps TherEx:  Finger walk:  -flexion 8x -abduction 8x  AROM supine: flexion 10x; 2 sets with holds; abduction 10x; 2 sets with holds, IR/ER to belly to neutral 10x; 2 sets  Wall slide 10x;  Wall posture 10x      PATIENT EDUCATION: Education details: See above- extensive education this visit  Person educated: patient  Education method: collaborative learning, deliberate practice, positive reinforcement, explicit instruction, establish rules,  Education comprehension: adequate   HOME EXERCISE PROGRAM: Access Code: [email or web address] URL: [email or web address] Date: 01/07/2023 Prepared by: Alvera Novel  Exercises - Seated Cervical Sidebending Stretch  - 1-2 x daily - 3 sets - 30 hold - Seated Assisted Cervical  Rotation with Towel  - 1-2 x daily - 3 sets - 30 hold - Sidelying Thoracic Rotation Book Openings- Both sides  - 1 x daily - 1-2 x weekly - 1 sets - 20 reps - 1-2sec hold - Supine Cervical Retraction with Towel  - 2 x daily - 7 x weekly - 1 sets - 15 reps - 3-5sec hold - Shoulder external rotation, scapular retraction RED band  - 1 x daily - 3 x weekly - 3 sets - 15 reps - Standing Bilateral Low Shoulder Row with Anchored Resistance  - 1 x daily - 7 x weekly - 3 sets - 15 reps  Access Code: [email or web address] URL: [email or web address] Date: 02/04/2023 Prepared by: Precious Bard  Exercises - Supine Shoulder Flexion AAROM with Dowel  - 1 x daily - 7 x weekly - 2 sets - 10 reps - 5 hold - Supine Shoulder Abduction AAROM with Dowel  - 1 x daily - 7 x weekly - 2 sets - 10 reps - 5 hold - Supine Shoulder External Rotation with Dowel  - 1  x daily - 7 x weekly - 2 sets - 10 reps - 5 hold - Seated Scapular Retraction  - 1 x daily - 7 x weekly - 2 sets - 10 reps - 5 hold  ASSESSMENT:  CLINICAL IMPRESSION: Patient is progressing with AROM in supine position with increased range without pain. Patient educated on need to perform in supine position for gravity elimination at this time. Patient tolerates wall slides in standing for AAROM and added to HEP. Patient is highly motivated throughout session.   Patient will benefit from skilled PT services to fully rehabilitate this operative shoulder to allow for full return to PLOF in UE use for ADL, IADL, and fitness/leisure.   OBJECTIVE IMPAIRMENTS: decreased activity tolerance, decreased endurance, decreased knowledge of condition, decreased ROM, decreased strength, increased muscle spasms, impaired flexibility, and improper body mechanics.   ACTIVITY LIMITATIONS: carrying, lifting, bathing, toileting, dressing, self feeding, reach over head, and hygiene/grooming  PARTICIPATION LIMITATIONS: meal prep, cleaning, laundry, driving, shopping, community  activity, occupation, and yard work  PERSONAL FACTORS: Age, Behavior pattern, Education, Fitness, Past/current experiences, Profession, Social background, Time since onset of injury/illness/exacerbation, and Transportation are also affecting patient's functional outcome.   REHAB POTENTIAL: Excellent  CLINICAL DECISION MAKING: Stable/uncomplicated  EVALUATION COMPLEXITY: Low   GOALS: Goals reviewed with patient? No  SHORT TERM GOALS: Target date: 02/07/23  Pt to demonstrate Rt shoulder P/ROM flexion to >120 degrees, ABDCT >90 degrees, ER >45 degrees to improve ability to perform ADL/selfcare.  Baseline: Goal status: INITIAL  2.  Pt to report successful HEP performance and successful adherence to postoperative restrictions.  Baseline:  Goal status: INITIAL  3.  Pt to demonstrate 3/5 Rt shoulder MMT to 90 degrees of flexion and 90 degrees of abduction.  Baseline: unable Goal status: INITIAL  LONG TERM GOALS: Target date: 04/07/23  Pt to improve FOTO survey goal by >60 points to indicate a monstrous reduction in difficulty of Rt limb use in a variety of questionable activities.  Baseline: 4 Goal status: INITIAL  2.  Pt to demonstrate 5/5 Rt shoulder flexion, abduction, ER, IR, extension; 5/5 Rt elbow flexion/extension  Baseline:  Goal status: INITIAL  3.  Pt to demonstrate >155 degrees Rt shoulder A/ROM without end-range pain>2/10.  Baseline:  Goal status: INITIAL  4.  Pt to demonstrate ability to perform gym-type exercises of resistance and aerobic natures without pain exacerbation in shoulder to allow for return to regular exercise at DC.  Baseline:  Goal status: INITIAL  PLAN:  PT FREQUENCY: 1-2x/week  PT DURATION: 3 months   PLANNED INTERVENTIONS: 97110-Therapeutic exercises, 97530- Therapeutic activity, [id number]- Neuromuscular re-education, 97535- Self Care, [id number]- Manual therapy, 97014- Electrical stimulation (unattended), [id number]- Electrical stimulation (manual),  Patient/Family education, and Moist heat  PLAN FOR NEXT SESSION: continue per Shoulder protocol - week 4  11:13 AM, 02/18/23     Precious Bard, PT 02/18/2023, 11:13 AM

## 2023-02-18 ENCOUNTER — Ambulatory Visit: Payer: 59

## 2023-02-18 DIAGNOSIS — M25619 Stiffness of unspecified shoulder, not elsewhere classified: Secondary | ICD-10-CM | POA: Diagnosis not present

## 2023-02-18 DIAGNOSIS — M6281 Muscle weakness (generalized): Secondary | ICD-10-CM | POA: Diagnosis not present

## 2023-02-18 DIAGNOSIS — M25511 Pain in right shoulder: Secondary | ICD-10-CM

## 2023-02-18 DIAGNOSIS — M25611 Stiffness of right shoulder, not elsewhere classified: Secondary | ICD-10-CM | POA: Diagnosis not present

## 2023-02-19 ENCOUNTER — Encounter (HOSPITAL_BASED_OUTPATIENT_CLINIC_OR_DEPARTMENT_OTHER): Payer: Self-pay | Admitting: Orthopaedic Surgery

## 2023-02-19 NOTE — Therapy (Signed)
OUTPATIENT PHYSICAL THERAPY SHOULDER TREATMENT/ Physical Therapy Progress Note   Dates of reporting period  01/07/23   to   02/20/23     Patient Name: Shelby Henson MRN: [id number] DOB:[birth year], 53 y.o., female Today's Date: 02/20/2023  END OF SESSION:  PT End of Session - 02/20/23 0758     Visit Number 10    Number of Visits 24    Date for PT Re-Evaluation 04/07/23    Authorization Type [facility] [employer] FOC[employer]    Authorization Time Period 01/07/23-04/07/23    Progress Note Due on Visit 10    PT Start Time 0800    PT Stop Time 0844    PT Time Calculation (min) 44 min    Activity Tolerance Patient tolerated treatment well;Patient limited by pain    Behavior During Therapy [facility] for tasks assessed/performed                     Past Medical History:  Diagnosis Date   Anemia    Asthma    well controlled   Colon polyps    Complication of anesthesia    DDD (degenerative disc disease), lumbar    Depression    GERD (gastroesophageal reflux disease)    occ   Iron deficiency anemia    Migraine    Morbid obesity (HCC)    Osteoarthritis    Osteoarthritis of right hip    PONV (postoperative nausea and vomiting)    with c-section x 1   Right rotator cuff tear 11/2022   Vitamin D deficiency    Past Surgical History:  Procedure Laterality Date   BREAST CYST ASPIRATION Left    CESAREAN SECTION  1990   CESAREAN SECTION  1993   CESAREAN SECTION  1996   COLONOSCOPY N/A 09/22/2020   Procedure: COLONOSCOPY;  Surgeon: Pasty Spillers, MD;  Location: [facility] ENDOSCOPY;  Service: Endoscopy;  Laterality: N/A;   LAPAROSCOPY  2018   LEEP  2005   benign pathology; Dr. Luella Cook   SHOULDER ACROMIOPLASTY Right 01/03/2023   Procedure: RIGHT SHOULDER ACROMIOPLASTY;  Surgeon: Huel Cote, MD;  Location: [facility] ORS;  Service: Orthopedics;  Laterality: Right;   SHOULDER ARTHROSCOPY WITH ROTATOR CUFF REPAIR AND OPEN BICEPS TENODESIS Right 01/03/2023   Procedure: RIGHT SHOULDER  ARTHROSCOPY WITH ROTATOR CUFF REPAIR AND  BICEPS TENODESIS;  Surgeon: Huel Cote, MD;  Location: [facility] ORS;  Service: Orthopedics;  Laterality: Right;   SUPRACERVICAL ABDOMINAL HYSTERECTOMY  2010   leio/AUB; Dr. Luella Cook   TOTAL HIP ARTHROPLASTY Right 11/12/2018   Procedure: TOTAL HIP ARTHROPLASTY ANTERIOR APPROACH;  Surgeon: Lyndle Herrlich, MD;  Location: [facility] ORS;  Service: Orthopedics;  Laterality: Right;   Patient Active Problem List   Diagnosis Date Noted   Nontraumatic incomplete tear of right rotator cuff 01/03/2023   Right rotator cuff tendonitis 09/11/2022   Iron deficiency anemia due to dietary causes 08/10/2022   Asthma, moderate persistent, poorly-controlled 08/10/2022   Chronic right shoulder pain 05/11/2022   Morbid obesity (HCC) 01/05/2022   Rotator cuff impingement syndrome, right 09/29/2021   Biceps tendinitis, right 09/29/2021   Meralgia paresthetica of right side 03/29/2021   Psoas tendinitis, right hip 03/29/2021   History of total right hip arthroplasty 03/29/2021   Status post total replacement of right hip 02/19/2021   Meralgia paresthetica of right side 02/19/2021   Polyp of ascending colon 02/19/2021   Polyp of sigmoid colon 02/19/2021   Special screening for malignant neoplasms, colon    Polyp of  ascending colon    Polyp of sigmoid colon    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 03/23/2020   Pain in right hip 01/12/2019   Vitamin D deficiency 02/25/2018   Chronic pain 10/24/2015   Hot flashes 10/24/2015   Migraine without aura and without status migrainosus, not intractable 10/24/2015   Metabolic syndrome 10/24/2015   Dyslipidemia 10/24/2015   Allergic rhinitis, seasonal 05/17/2015   Asthma in adult, mild intermittent, with acute exacerbation 08/08/2014   Chronic insomnia 08/08/2014   DDD (degenerative disc disease), lumbar 08/08/2014   Mood disorder (HCC) 08/08/2014   Gastroesophageal reflux disease without esophagitis  08/08/2014   H/O total hysterectomy 08/08/2014   Irritable bowel syndrome with diarrhea 08/08/2014   Osteoarthritis of right hip 08/08/2014   Adult BMI 30+ 08/08/2014   SBO (spina bifida occulta) 08/08/2014    PCP: Alba Cory, MD   REFERRING PROVIDER: Huel Cote, MD orthopedics   REFERRING DIAG: Rt arthroscopic rotator cuff repair   THERAPY DIAG:  Right shoulder pain, unspecified chronicity  Stiffness of right shoulder, not elsewhere classified  Muscle weakness (generalized)  Rationale for Evaluation and Treatment: Rehabilitation  ONSET DATE: 01/03/23  SUBJECTIVE:                                                                                                                                                                                      SUBJECTIVE STATEMENT: Patient has been compliant with HEP. Has been having some pain with the cold weather.   Hand dominance: Right  PERTINENT HISTORY: Shelby Henson is a 53yo F presents to outpatient physical therapy on postop day 4 status post right arthroscopic shoulder debridement of supraspinatus tendon, anterior glenoid labrum, subacromial depression, rotator cuff repair with Dr. Huel Cote.  Patient reports more than 1 year of insidious onset shoulder pain previously followed by sports medicine Dr. Ashley Royalty.  PAIN:  Are you having pain? 3/10 right shoulder: Worst pain was 8 out of 10 while sleeping overnight 2 nights ago.  PRECAUTIONS: Patient in sling immobilizer and nonweightbearing x 2 weeks.  Awaiting updates pending follow-up visit with orthopedics.   WEIGHT BEARING RESTRICTIONS: Yes, right upper extremity nonweightbearing  FALLS:  Has patient fallen in last 6 months?  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 PATIENT SURVEYS:  FOTO: 4  SENSATION: WNL  POSTURE: WNL  UPPER EXTREMITY ROM:    ROM Right  eval Left eval  Shoulder flexion 90   Shoulder extension NT   Shoulder abduction NT   Shoulder internal rotation full   Shoulder external rotation NT   Elbow flexion full   Elbow extension full    Wrist flexion full    Wrist extension full    Wrist ulnar deviation full    Wrist radial deviation full    forearm pronation full    forearm supination full    (Blank rows = not tested)    PALPATION:  Tenderness at the posterior shoulder portal wound while adjusting/donning sling   *PROTOCOL LOCATED IN MEDIA SECTION OF EPIC   TODAY'S TREATMENT:                                                                                                                                         DATE: 02/20/23  Physical therapy treatment session today consisted of completing assessment of goals and administration of testing as demonstrated and documented in flow sheet, treatment, and goals section of this note. Addition treatments may be found below.   Manual therapy: (supine)  Gentle grade 1-2 GH joint mobs (inf/PA/AP) x several min Gentle PROM right shoulder flex  improved over time Gentle PROM right shoulder x several min Gentle PROM right shoulder ER- up to 40 deg x several min- no pain  Bicep lengthening stretch with pressure x multiple reps TherEx:  Finger walk:  -flexion 8x -abduction 8x  AROM supine: flexion 10x; 2 sets with holds; abduction 10x; 2 sets with holds, IR/ER to belly to neutral 10x; 2 sets       PATIENT EDUCATION: Education details: See above- extensive education this visit  Person educated: patient  Education method: Research scientist (medical), deliberate practice, positive reinforcement, explicit instruction, establish rules,  Education comprehension: adequate   HOME  EXERCISE PROGRAM: Access Code: [email or web address] URL: [email or web address] Date: 01/07/2023 Prepared by: Alvera Novel  Exercises - Seated Cervical Sidebending Stretch  - 1-2 x daily - 3 sets - 30 hold - Seated Assisted Cervical Rotation with Towel  - 1-2 x daily - 3 sets - 30 hold - Sidelying Thoracic Rotation Book Openings- Both sides  - 1 x daily - 1-2 x weekly - 1 sets - 20 reps - 1-2sec hold - Supine Cervical Retraction with Towel  - 2 x daily - 7 x weekly - 1 sets - 15 reps - 3-5sec hold - Shoulder external rotation, scapular retraction RED band  - 1 x daily - 3 x weekly - 3 sets - 15 reps - Standing Bilateral Low Shoulder Row with Anchored Resistance  - 1 x daily - 7 x weekly - 3 sets - 15 reps  Access Code: [email or web address] URL: [email or web address] Date: 02/04/2023 Prepared by: Precious Bard  Exercises - Supine Shoulder Flexion AAROM with Dowel  - 1 x daily - 7 x weekly - 2 sets - 10 reps - 5 hold -  Supine Shoulder Abduction AAROM with Dowel  - 1 x daily - 7 x weekly - 2 sets - 10 reps - 5 hold - Supine Shoulder External Rotation with Dowel  - 1 x daily - 7 x weekly - 2 sets - 10 reps - 5 hold - Seated Scapular Retraction  - 1 x daily - 7 x weekly - 2 sets - 10 reps - 5 hold  ASSESSMENT:  CLINICAL IMPRESSION:  Patient's condition has the potential to improve in response to therapy. Maximum improvement is yet to be obtained. The anticipated improvement is attainable and reasonable in a generally predictable time. Patient is making progress with AROM in supine and seated position. She is highly motivated throughout session. Her strength not measured yet due to protocol at this time.   Patient will benefit from skilled PT services to fully rehabilitate this operative shoulder to allow for full return to PLOF in UE use for ADL, IADL, and fitness/leisure.   OBJECTIVE IMPAIRMENTS: decreased activity tolerance, decreased endurance, decreased knowledge of condition,  decreased ROM, decreased strength, increased muscle spasms, impaired flexibility, and improper body mechanics.   ACTIVITY LIMITATIONS: carrying, lifting, bathing, toileting, dressing, self feeding, reach over head, and hygiene/grooming  PARTICIPATION LIMITATIONS: meal prep, cleaning, laundry, driving, shopping, community activity, occupation, and yard work  PERSONAL FACTORS: Age, Behavior pattern, Education, Fitness, Past/current experiences, Profession, Social background, Time since onset of injury/illness/exacerbation, and Transportation are also affecting patient's functional outcome.   REHAB POTENTIAL: Excellent  CLINICAL DECISION MAKING: Stable/uncomplicated  EVALUATION COMPLEXITY: Low   GOALS: Goals reviewed with patient? No  SHORT TERM GOALS: Target date: 02/07/23  Pt to demonstrate Rt shoulder P/ROM flexion to >120 degrees, ABDCT >90 degrees, ER >45 degrees to improve ability to perform ADL/selfcare.  Baseline: 1/22: flexion: 70, abduction 38, scaption 71 Goal status:Partially Met   2.  Pt to report successful HEP performance and successful adherence to postoperative restrictions.  Baseline: HEP compliant Goal status:MET  3.  Pt to demonstrate 3/5 Rt shoulder MMT to 90 degrees of flexion and 90 degrees of abduction.  Baseline: unable 1/22: not able to due to protocol Goal status: in progress  LONG TERM GOALS: Target date: 04/07/23  Pt to improve FOTO survey goal by >60 points to indicate a monstrous reduction in difficulty of Rt limb use in a variety of questionable activities.  Baseline: 4 1/22: 44 Goal status: Partially Met   2.  Pt to demonstrate 5/5 Rt shoulder flexion, abduction, ER, IR, extension; 5/5 Rt elbow flexion/extension  Baseline: 1/22: deferred due to protocol Goal status:ongoing   3.  Pt to demonstrate >155 degrees Rt shoulder A/ROM without end-range pain>2/10.  Baseline: 1/22: flexion: 70, abduction 38, scaption 71 Goal status: Partially Met  4.  Pt  to demonstrate ability to perform gym-type exercises of resistance and aerobic natures without pain exacerbation in shoulder to allow for return to regular exercise at DC.  Baseline: 1/22: not released from protocol yet Goal status: ongoing   PLAN:  PT FREQUENCY: 1-2x/week  PT DURATION: 3 months   PLANNED INTERVENTIONS: 97110-Therapeutic exercises, 97530- Therapeutic activity, 97112- Neuromuscular re-education, 97535- Self Care, 13086- Manual therapy, 97014- Electrical stimulation (unattended), [phone]- Electrical stimulation (manual), Patient/Family education, and Moist heat  PLAN FOR NEXT SESSION: continue per Shoulder protocol - week 4  9:05 AM, 02/20/23     Precious Bard, PT 02/20/2023, 9:05 AM

## 2023-02-20 ENCOUNTER — Other Ambulatory Visit (HOSPITAL_BASED_OUTPATIENT_CLINIC_OR_DEPARTMENT_OTHER): Payer: Self-pay | Admitting: Orthopaedic Surgery

## 2023-02-20 ENCOUNTER — Ambulatory Visit: Payer: 59

## 2023-02-20 DIAGNOSIS — M6281 Muscle weakness (generalized): Secondary | ICD-10-CM | POA: Diagnosis not present

## 2023-02-20 DIAGNOSIS — M25511 Pain in right shoulder: Secondary | ICD-10-CM

## 2023-02-20 DIAGNOSIS — M25619 Stiffness of unspecified shoulder, not elsewhere classified: Secondary | ICD-10-CM | POA: Diagnosis not present

## 2023-02-20 DIAGNOSIS — M75111 Incomplete rotator cuff tear or rupture of right shoulder, not specified as traumatic: Secondary | ICD-10-CM

## 2023-02-20 DIAGNOSIS — M25611 Stiffness of right shoulder, not elsewhere classified: Secondary | ICD-10-CM

## 2023-02-25 ENCOUNTER — Ambulatory Visit: Payer: 59

## 2023-02-26 NOTE — Therapy (Signed)
OUTPATIENT PHYSICAL THERAPY SHOULDER TREATMENT/     Patient Name: Shelby Henson MRN: [id number] DOB:11-10-1970, 54 y.o., female Today's Date: 02/27/2023  END OF SESSION:  PT End of Session - 02/27/23 0749     Visit Number 11    Number of Visits 24    Date for PT Re-Evaluation 04/07/23    Authorization Type [facility] [employer] FOCUS    Authorization Time Period 01/07/23-04/07/23    Progress Note Due on Visit 10    PT Start Time 0759    PT Stop Time 0844    PT Time Calculation (min) 45 min    Activity Tolerance Patient tolerated treatment well;Patient limited by pain    Behavior During Therapy [facility] for tasks assessed/performed                      Past Medical History:  Diagnosis Date   Anemia    Asthma    well controlled   Colon polyps    Complication of anesthesia    DDD (degenerative disc disease), lumbar    Depression    GERD (gastroesophageal reflux disease)    occ   Iron deficiency anemia    Migraine    Morbid obesity (HCC)    Osteoarthritis    Osteoarthritis of right hip    PONV (postoperative nausea and vomiting)    with c-section x 1   Right rotator cuff tear 11/2022   Vitamin D deficiency    Past Surgical History:  Procedure Laterality Date   BREAST CYST ASPIRATION Left    CESAREAN SECTION  1990   CESAREAN SECTION  1993   CESAREAN SECTION  1996   COLONOSCOPY N/A 09/22/2020   Procedure: COLONOSCOPY;  Surgeon: Pasty Spillers, MD;  Location: [facility] ENDOSCOPY;  Service: Endoscopy;  Laterality: N/A;   LAPAROSCOPY  2018   LEEP  2005   benign pathology; Dr. Luella Cook   SHOULDER ACROMIOPLASTY Right 01/03/2023   Procedure: RIGHT SHOULDER ACROMIOPLASTY;  Surgeon: Huel Cote, MD;  Location: [facility] ORS;  Service: Orthopedics;  Laterality: Right;   SHOULDER ARTHROSCOPY WITH ROTATOR CUFF REPAIR AND OPEN BICEPS TENODESIS Right 01/03/2023   Procedure: RIGHT SHOULDER ARTHROSCOPY WITH ROTATOR CUFF REPAIR AND  BICEPS TENODESIS;  Surgeon: Huel Cote, MD;   Location: [facility] ORS;  Service: Orthopedics;  Laterality: Right;   SUPRACERVICAL ABDOMINAL HYSTERECTOMY  2010   leio/AUB; Dr. Luella Cook   TOTAL HIP ARTHROPLASTY Right 11/12/2018   Procedure: TOTAL HIP ARTHROPLASTY ANTERIOR APPROACH;  Surgeon: Lyndle Herrlich, MD;  Location: [facility] ORS;  Service: Orthopedics;  Laterality: Right;   Patient Active Problem List   Diagnosis Date Noted   Nontraumatic incomplete tear of right rotator cuff 01/03/2023   Right rotator cuff tendonitis 09/11/2022   Iron deficiency anemia due to dietary causes 08/10/2022   Asthma, moderate persistent, poorly-controlled 08/10/2022   Chronic right shoulder pain 05/11/2022   Morbid obesity (HCC) 01/05/2022   Rotator cuff impingement syndrome, right 09/29/2021   Biceps tendinitis, right 09/29/2021   Meralgia paresthetica of right side 03/29/2021   Psoas tendinitis, right hip 03/29/2021   History of total right hip arthroplasty 03/29/2021   Status post total replacement of right hip 02/19/2021   Meralgia paresthetica of right side 02/19/2021   Polyp of ascending colon 02/19/2021   Polyp of sigmoid colon 02/19/2021   Special screening for malignant neoplasms, colon    Polyp of ascending colon    Polyp of sigmoid colon    COVID-19 long hauler manifesting chronic  loss of smell and taste 03/23/2020   Pain in right hip 01/12/2019   Vitamin D deficiency 02/25/2018   Chronic pain 10/24/2015   Hot flashes 10/24/2015   Migraine without aura and without status migrainosus, not intractable 10/24/2015   Metabolic syndrome 10/24/2015   Dyslipidemia 10/24/2015   Allergic rhinitis, seasonal 05/17/2015   Asthma in adult, mild intermittent, with acute exacerbation 08/08/2014   Chronic insomnia 08/08/2014   DDD (degenerative disc disease), lumbar 08/08/2014   Mood disorder (HCC) 08/08/2014   Gastroesophageal reflux disease without esophagitis 08/08/2014   H/O total hysterectomy 08/08/2014   Irritable bowel syndrome with diarrhea  08/08/2014   Osteoarthritis of right hip 08/08/2014   Adult BMI 30+ 08/08/2014   SBO (spina bifida occulta) 08/08/2014    PCP: Alba Cory, MD   REFERRING PROVIDER: Huel Cote, MD orthopedics   REFERRING DIAG: Rt arthroscopic rotator cuff repair   THERAPY DIAG:  Right shoulder pain, unspecified chronicity  Stiffness of right shoulder, not elsewhere classified  Muscle weakness (generalized)  Rationale for Evaluation and Treatment: Rehabilitation  ONSET DATE: 01/03/23  SUBJECTIVE:                                                                                                                                                                                      SUBJECTIVE STATEMENT: Patient has been ill the past few days with an exacerbation of her asthma.   Hand dominance: Right  PERTINENT HISTORY: Shelby Henson is a 53yo F presents to outpatient physical therapy on postop day 4 status post right arthroscopic shoulder debridement of supraspinatus tendon, anterior glenoid labrum, subacromial depression, rotator cuff repair with Dr. Huel Cote.  Patient reports more than 1 year of insidious onset shoulder pain previously followed by sports medicine Dr. Ashley Royalty.  PAIN:  Are you having pain? 3/10 right shoulder: Worst pain was 8 out of 10 while sleeping overnight 2 nights ago.  PRECAUTIONS: Patient in sling immobilizer and nonweightbearing x 2 weeks.  Awaiting updates pending follow-up visit with orthopedics.   WEIGHT BEARING RESTRICTIONS: Yes, right upper extremity nonweightbearing  FALLS:  Has patient fallen in last 6 months?  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 PATIENT SURVEYS:  FOTO: 4  SENSATION: WNL  POSTURE: WNL  UPPER EXTREMITY ROM:    ROM Right eval Left eval  Shoulder flexion 90   Shoulder extension NT   Shoulder abduction NT  Shoulder internal rotation full   Shoulder external rotation NT   Elbow flexion full   Elbow extension full    Wrist flexion full    Wrist extension full    Wrist ulnar deviation full    Wrist radial deviation full    forearm pronation full    forearm supination full    (Blank rows = not tested)    PALPATION:  Tenderness at the posterior shoulder portal wound while adjusting/donning sling   *PROTOCOL LOCATED IN MEDIA SECTION OF EPIC   TODAY'S TREATMENT:                                                                                                                                         DATE: 02/27/23  Manual therapy: (supine)  Gentle grade 1-2 GH joint mobs (inf/PA/AP) x several min Gentle PROM right shoulder flex  improved over time Gentle PROM right shoulder x several min Gentle PROM right shoulder ER- up to 40 deg x several min- no pain  Gentle PROM PNF d2 10x  Bicep lengthening stretch with pressure x multiple reps TherEx:  Finger walk:  -flexion 8x -abduction 8x  AROM supine: flexion 10x; 2 sets with holds; abduction 10x; 2 sets with holds, IR/ER to belly to neutral 10x; 2 sets  UE ranger:  Flexion/extension 10x ABC      PATIENT EDUCATION: Education details: See above- extensive education this visit  Person educated: patient  Education method: Research scientist (medical), deliberate practice, positive reinforcement, explicit instruction, establish rules,  Education comprehension: adequate   HOME EXERCISE PROGRAM: Access Code: [email or web address] URL: [email or web address] Date: 01/07/2023 Prepared by: Alvera Novel  Exercises - Seated Cervical Sidebending Stretch  - 1-2 x daily - 3 sets - 30 hold - Seated Assisted Cervical Rotation with Towel  - 1-2 x  daily - 3 sets - 30 hold - Sidelying Thoracic Rotation Book Openings- Both sides  - 1 x daily - 1-2 x weekly - 1 sets - 20 reps - 1-2sec hold - Supine Cervical Retraction with Towel  - 2 x daily - 7 x weekly - 1 sets - 15 reps - 3-5sec hold - Shoulder external rotation, scapular retraction RED band  - 1 x daily - 3 x weekly - 3 sets - 15 reps - Standing Bilateral Low Shoulder Row with Anchored Resistance  - 1 x daily - 7 x weekly - 3 sets - 15 reps  Access Code: [email or web address] URL: [email or web address] Date: 02/04/2023 Prepared by: Precious Bard  Exercises - Supine Shoulder Flexion AAROM with Dowel  - 1 x daily - 7 x weekly - 2 sets - 10 reps - 5 hold - Supine Shoulder Abduction AAROM with Dowel  - 1 x daily - 7 x weekly - 2 sets - 10 reps - 5 hold - Supine Shoulder External Rotation with Dowel  - 1 x daily - 7 x weekly - 2 sets -  10 reps - 5 hold - Seated Scapular Retraction  - 1 x daily - 7 x weekly - 2 sets - 10 reps - 5 hold  ASSESSMENT:  CLINICAL IMPRESSION: Patient is progressing with functional ROM. Addition of PNF patterning tolerated but initially painful. Patient tolerates ABC on UE ranger without pain this session.   Patient will benefit from skilled PT services to fully rehabilitate this operative shoulder to allow for full return to PLOF in UE use for ADL, IADL, and fitness/leisure.   OBJECTIVE IMPAIRMENTS: decreased activity tolerance, decreased endurance, decreased knowledge of condition, decreased ROM, decreased strength, increased muscle spasms, impaired flexibility, and improper body mechanics.   ACTIVITY LIMITATIONS: carrying, lifting, bathing, toileting, dressing, self feeding, reach over head, and hygiene/grooming  PARTICIPATION LIMITATIONS: meal prep, cleaning, laundry, driving, shopping, community activity, occupation, and yard work  PERSONAL FACTORS: Age, Behavior pattern, Education, Fitness, Past/current experiences, Profession, Social background, Time  since onset of injury/illness/exacerbation, and Transportation are also affecting patient's functional outcome.   REHAB POTENTIAL: Excellent  CLINICAL DECISION MAKING: Stable/uncomplicated  EVALUATION COMPLEXITY: Low   GOALS: Goals reviewed with patient? No  SHORT TERM GOALS: Target date: 02/07/23  Pt to demonstrate Rt shoulder P/ROM flexion to >120 degrees, ABDCT >90 degrees, ER >45 degrees to improve ability to perform ADL/selfcare.  Baseline: 1/22: flexion: 70, abduction 38, scaption 71 Goal status:Partially Met   2.  Pt to report successful HEP performance and successful adherence to postoperative restrictions.  Baseline: HEP compliant Goal status:MET  3.  Pt to demonstrate 3/5 Rt shoulder MMT to 90 degrees of flexion and 90 degrees of abduction.  Baseline: unable 1/22: not able to due to protocol Goal status: in progress  LONG TERM GOALS: Target date: 04/07/23  Pt to improve FOTO survey goal by >60 points to indicate a monstrous reduction in difficulty of Rt limb use in a variety of questionable activities.  Baseline: 4 1/22: 44 Goal status: Partially Met   2.  Pt to demonstrate 5/5 Rt shoulder flexion, abduction, ER, IR, extension; 5/5 Rt elbow flexion/extension  Baseline: 1/22: deferred due to protocol Goal status:ongoing   3.  Pt to demonstrate >155 degrees Rt shoulder A/ROM without end-range pain>2/10.  Baseline: 1/22: flexion: 70, abduction 38, scaption 71 Goal status: Partially Met  4.  Pt to demonstrate ability to perform gym-type exercises of resistance and aerobic natures without pain exacerbation in shoulder to allow for return to regular exercise at DC.  Baseline: 1/22: not released from protocol yet Goal status: ongoing   PLAN:  PT FREQUENCY: 1-2x/week  PT DURATION: 3 months   PLANNED INTERVENTIONS: 97110-Therapeutic exercises, 97530- Therapeutic activity, 97112- Neuromuscular re-education, 97535- Self Care, 78295- Manual therapy, 97014- Electrical  stimulation (unattended), [phone]- Electrical stimulation (manual), Patient/Family education, and Moist heat  PLAN FOR NEXT SESSION: continue per Shoulder protocol - week 4  8:45 AM, 02/27/23     Precious Bard, PT 02/27/2023, 8:45 AM

## 2023-02-27 ENCOUNTER — Ambulatory Visit: Payer: 59

## 2023-02-27 ENCOUNTER — Encounter: Payer: Self-pay | Admitting: Family Medicine

## 2023-02-27 DIAGNOSIS — M6281 Muscle weakness (generalized): Secondary | ICD-10-CM

## 2023-02-27 DIAGNOSIS — M25611 Stiffness of right shoulder, not elsewhere classified: Secondary | ICD-10-CM

## 2023-02-27 DIAGNOSIS — M25619 Stiffness of unspecified shoulder, not elsewhere classified: Secondary | ICD-10-CM | POA: Diagnosis not present

## 2023-02-27 DIAGNOSIS — M25511 Pain in right shoulder: Secondary | ICD-10-CM | POA: Diagnosis not present

## 2023-03-04 ENCOUNTER — Other Ambulatory Visit: Payer: Self-pay | Admitting: Family Medicine

## 2023-03-04 ENCOUNTER — Other Ambulatory Visit: Payer: Self-pay

## 2023-03-04 ENCOUNTER — Ambulatory Visit: Payer: 59 | Attending: Orthopaedic Surgery

## 2023-03-04 DIAGNOSIS — M25611 Stiffness of right shoulder, not elsewhere classified: Secondary | ICD-10-CM | POA: Insufficient documentation

## 2023-03-04 DIAGNOSIS — G43009 Migraine without aura, not intractable, without status migrainosus: Secondary | ICD-10-CM

## 2023-03-04 DIAGNOSIS — M25619 Stiffness of unspecified shoulder, not elsewhere classified: Secondary | ICD-10-CM | POA: Diagnosis not present

## 2023-03-04 DIAGNOSIS — M6281 Muscle weakness (generalized): Secondary | ICD-10-CM | POA: Insufficient documentation

## 2023-03-04 DIAGNOSIS — M25511 Pain in right shoulder: Secondary | ICD-10-CM | POA: Diagnosis not present

## 2023-03-04 MED ORDER — EMGALITY 120 MG/ML ~~LOC~~ SOAJ
1.0000 mL | SUBCUTANEOUS | 5 refills | Status: DC
  Filled 2023-03-04 – 2023-03-22 (×2): qty 1, 28d supply, fill #0
  Filled 2023-04-22: qty 1, 28d supply, fill #1
  Filled 2023-05-23: qty 1, 28d supply, fill #2
  Filled 2023-06-27: qty 1, 28d supply, fill #3
  Filled 2023-07-25: qty 1, 28d supply, fill #4
  Filled 2023-09-09: qty 1, 28d supply, fill #5

## 2023-03-04 NOTE — Therapy (Signed)
OUTPATIENT PHYSICAL THERAPY SHOULDER TREATMENT/     Patient Name: Shelby Henson MRN: [id number] DOB:December 14, 1970, 53 y.o., female Today's Date: 03/04/2023  END OF SESSION:  PT End of Session - 03/04/23 0800     Visit Number 12    Number of Visits 24    Date for PT Re-Evaluation 04/07/23    Authorization Type [facility] [employer] F[employer]    Authorization Time Period 01/07/23-04/07/23    Progress Note Due on Visit 10    PT Start Time 0803    PT Stop Time 0846    PT Time Calculation (min) 43 min    Activity Tolerance Patient tolerated treatment well    Behavior During Therapy [facility] for tasks assessed/performed                      Past Medical History:  Diagnosis Date   Anemia    Asthma    well controlled   Colon polyps    Complication of anesthesia    DDD (degenerative disc disease), lumbar    Depression    GERD (gastroesophageal reflux disease)    occ   Iron deficiency anemia    Migraine    Morbid obesity (HCC)    Osteoarthritis    Osteoarthritis of right hip    PONV (postoperative nausea and vomiting)    with c-section x 1   Right rotator cuff tear 11/2022   Vitamin D deficiency    Past Surgical History:  Procedure Laterality Date   BREAST CYST ASPIRATION Left    CESAREAN SECTION  1990   CESAREAN SECTION  1993   CESAREAN SECTION  1996   COLONOSCOPY N/A 09/22/2020   Procedure: COLONOSCOPY;  Surgeon: Pasty Spillers, MD;  Location: [facility];  Service: Endoscopy;  Laterality: N/A;   LAPAROSCOPY  2018   LEEP  2005   benign pathology; Dr. Luella Cook   SHOULDER ACROMIOPLASTY Right 01/03/2023   Procedure: RIGHT SHOULDER ACROMIOPLASTY;  Surgeon: Huel Cote, MD;  Location: [facility];  Service: Orthopedics;  Laterality: Right;   SHOULDER ARTHROSCOPY WITH ROTATOR CUFF REPAIR AND OPEN BICEPS TENODESIS Right 01/03/2023   Procedure: RIGHT SHOULDER ARTHROSCOPY WITH ROTATOR CUFF REPAIR AND  BICEPS TENODESIS;  Surgeon: Huel Cote, MD;  Location: [facility];   Service: Orthopedics;  Laterality: Right;   SUPRACERVICAL ABDOMINAL HYSTERECTOMY  2010   leio/AUB; Dr. Luella Cook   TOTAL HIP ARTHROPLASTY Right 11/12/2018   Procedure: TOTAL HIP ARTHROPLASTY ANTERIOR APPROACH;  Surgeon: Lyndle Herrlich, MD;  Location: [facility];  Service: Orthopedics;  Laterality: Right;   Patient Active Problem List   Diagnosis Date Noted   Nontraumatic incomplete tear of right rotator cuff 01/03/2023   Right rotator cuff tendonitis 09/11/2022   Iron deficiency anemia due to dietary causes 08/10/2022   Asthma, moderate persistent, poorly-controlled 08/10/2022   Chronic right shoulder pain 05/11/2022   Morbid obesity (HCC) 01/05/2022   Rotator cuff impingement syndrome, right 09/29/2021   Biceps tendinitis, right 09/29/2021   Meralgia paresthetica of right side 03/29/2021   Psoas tendinitis, right hip 03/29/2021   History of total right hip arthroplasty 03/29/2021   Status post total replacement of right hip 02/19/2021   Meralgia paresthetica of right side 02/19/2021   Polyp of ascending colon 02/19/2021   Polyp of sigmoid colon 02/19/2021   Special screening for malignant neoplasms, colon    Polyp of ascending colon    Polyp of sigmoid colon    COVID-19 long hauler manifesting chronic loss of smell  and taste 03/23/2020   Pain in right hip 01/12/2019   Vitamin D deficiency 02/25/2018   Chronic pain 10/24/2015   Hot flashes 10/24/2015   Migraine without aura and without status migrainosus, not intractable 10/24/2015   Metabolic syndrome 10/24/2015   Dyslipidemia 10/24/2015   Allergic rhinitis, seasonal 05/17/2015   Asthma in adult, mild intermittent, with acute exacerbation 08/08/2014   Chronic insomnia 08/08/2014   DDD (degenerative disc disease), lumbar 08/08/2014   Mood disorder (HCC) 08/08/2014   Gastroesophageal reflux disease without esophagitis 08/08/2014   H/O total hysterectomy 08/08/2014   Irritable bowel syndrome with diarrhea 08/08/2014    Osteoarthritis of right hip 08/08/2014   Adult BMI 30+ 08/08/2014   SBO (spina bifida occulta) 08/08/2014    PCP: Alba Cory, MD   REFERRING PROVIDER: Huel Cote, MD orthopedics   REFERRING DIAG: Rt arthroscopic rotator cuff repair   THERAPY DIAG:  Right shoulder pain, unspecified chronicity  Stiffness of right shoulder, not elsewhere classified  Muscle weakness (generalized)  Limited range of motion (ROM) of shoulder  Rationale for Evaluation and Treatment: Rehabilitation  ONSET DATE: 01/03/23  SUBJECTIVE:                                                                                                                                                                                      SUBJECTIVE STATEMENT: Patient reports feeling pretty good    Hand dominance: Right  PERTINENT HISTORY: Shelby Henson is a 53yo F presents to outpatient physical therapy on postop day 4 status post right arthroscopic shoulder debridement of supraspinatus tendon, anterior glenoid labrum, subacromial depression, rotator cuff repair with Dr. Huel Cote.  Patient reports more than 1 year of insidious onset shoulder pain previously followed by sports medicine Dr. Ashley Royalty.  PAIN:  Are you having pain? 3/10 right shoulder: Worst pain was 8 out of 10 while sleeping overnight 2 nights ago.  PRECAUTIONS: Patient in sling immobilizer and nonweightbearing x 2 weeks.  Awaiting updates pending follow-up visit with orthopedics.   WEIGHT BEARING RESTRICTIONS: Yes, right upper extremity nonweightbearing  FALLS:  Has patient fallen in last 6 months?  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 PATIENT  SURVEYS:  FOTO: 4  SENSATION: WNL  POSTURE: WNL  UPPER EXTREMITY ROM:    ROM Right eval Left eval  Shoulder flexion 90   Shoulder extension NT   Shoulder abduction NT   Shoulder internal  rotation full   Shoulder external rotation NT   Elbow flexion full   Elbow extension full    Wrist flexion full    Wrist extension full    Wrist ulnar deviation full    Wrist radial deviation full    forearm pronation full    forearm supination full    (Blank rows = not tested)    PALPATION:  Tenderness at the posterior shoulder portal wound while adjusting/donning sling   *PROTOCOL LOCATED IN MEDIA SECTION OF EPIC   TODAY'S TREATMENT:                                                                                                                                         DATE: 03/04/23  THEREX:  (supine) R Shoulder Gentle grade 1-2 GH joint mobs (inf/PA/AP) x several min Gentle PROM right shoulder flex x several min Gentle PROM right shoulder x several min Gentle PROM right shoulder ER x several min  Mobilization with movement- GH joint - inf mobs with progressive flex/abd x several min; PA/AP mobs with R shoulder ER/IR  NMR: For improved scapulohumeral rhythm Scapular mobility- Shrug into depression (gentle) 2 sets of 10 Scapular mobility- Active assistive scap protraction- serratus punch (gentle) 2x 10 Scapular mobility- Active scap retraction 2 x 10 AROM supine: flexion 10x; 2 sets with holds; abduction 10x; 2 sets with holds, IR/ER to belly to neutral 10x; 2 sets  Therapeutic activities- For improved ROM and strength to achieve improved functional performance  Active shoulder flex (supine) 2 x 10  (no pain)  Active shoulder ABD (supine) 2 x 10 (no pain)  Active Shoulder ER/IR (supine) 2 x 10 (no pain)     PATIENT EDUCATION: Education details: See above- extensive education this visit  Person educated: patient  Education method: Research scientist (medical), deliberate  practice, positive reinforcement, explicit instruction, establish rules,  Education comprehension: adequate   HOME EXERCISE PROGRAM: Access Code: [email or web address] URL: [email or web address] Date: 01/07/2023 Prepared by: Alvera Novel  Exercises - Seated Cervical Sidebending Stretch  - 1-2 x daily - 3 sets - 30 hold - Seated Assisted Cervical Rotation with Towel  - 1-2 x daily - 3 sets - 30 hold - Sidelying Thoracic Rotation Book Openings- Both sides  - 1 x daily - 1-2 x weekly - 1 sets - 20 reps - 1-2sec hold - Supine Cervical Retraction with Towel  - 2 x daily - 7 x weekly - 1 sets - 15 reps - 3-5sec hold - Shoulder external rotation, scapular retraction RED band  - 1 x daily - 3 x weekly - 3 sets - 15 reps - Standing Bilateral Low Shoulder Row with Anchored Resistance  - 1 x daily - 7 x weekly - 3 sets - 15 reps  Access Code: [email or web address] URL: [email or web address] Date: 02/04/2023 Prepared by: Precious Bard  Exercises - Supine Shoulder Flexion AAROM with Dowel  - 1  x daily - 7 x weekly - 2 sets - 10 reps - 5 hold - Supine Shoulder Abduction AAROM with Dowel  - 1 x daily - 7 x weekly - 2 sets - 10 reps - 5 hold - Supine Shoulder External Rotation with Dowel  - 1 x daily - 7 x weekly - 2 sets - 10 reps - 5 hold - Seated Scapular Retraction  - 1 x daily - 7 x weekly - 2 sets - 10 reps - 5 hold  ASSESSMENT:  CLINICAL IMPRESSION: Patient presents with good motivation for today's session. Overall progressing well - mostly firm end feel vs. Any empty end feel with all R shoulder ROM. She is transitioning well to more Active ROM activities and able to incorporate more scapular strengthening per protocol guidelines. No pain reported during session today.  Patient will benefit from skilled PT services to fully rehabilitate this operative shoulder to allow for full return to PLOF in UE use for ADL, IADL, and fitness/leisure.   OBJECTIVE IMPAIRMENTS: decreased activity  tolerance, decreased endurance, decreased knowledge of condition, decreased ROM, decreased strength, increased muscle spasms, impaired flexibility, and improper body mechanics.   ACTIVITY LIMITATIONS: carrying, lifting, bathing, toileting, dressing, self feeding, reach over head, and hygiene/grooming  PARTICIPATION LIMITATIONS: meal prep, cleaning, laundry, driving, shopping, community activity, occupation, and yard work  PERSONAL FACTORS: Age, Behavior pattern, Education, Fitness, Past/current experiences, Profession, Social background, Time since onset of injury/illness/exacerbation, and Transportation are also affecting patient's functional outcome.   REHAB POTENTIAL: Excellent  CLINICAL DECISION MAKING: Stable/uncomplicated  EVALUATION COMPLEXITY: Low   GOALS: Goals reviewed with patient? No  SHORT TERM GOALS: Target date: 02/07/23  Pt to demonstrate Rt shoulder P/ROM flexion to >120 degrees, ABDCT >90 degrees, ER >45 degrees to improve ability to perform ADL/selfcare.  Baseline: 1/22: flexion: 70, abduction 38, scaption 71 Goal status:Partially Met   2.  Pt to report successful HEP performance and successful adherence to postoperative restrictions.  Baseline: HEP compliant Goal status:MET  3.  Pt to demonstrate 3/5 Rt shoulder MMT to 90 degrees of flexion and 90 degrees of abduction.  Baseline: unable 1/22: not able to due to protocol Goal status: in progress  LONG TERM GOALS: Target date: 04/07/23  Pt to improve FOTO survey goal by >60 points to indicate a monstrous reduction in difficulty of Rt limb use in a variety of questionable activities.  Baseline: 4 1/22: 44 Goal status: Partially Met   2.  Pt to demonstrate 5/5 Rt shoulder flexion, abduction, ER, IR, extension; 5/5 Rt elbow flexion/extension  Baseline: 1/22: deferred due to protocol Goal status:ongoing   3.  Pt to demonstrate >155 degrees Rt shoulder A/ROM without end-range pain>2/10.  Baseline: 1/22: flexion:  70, abduction 38, scaption 71 Goal status: Partially Met  4.  Pt to demonstrate ability to perform gym-type exercises of resistance and aerobic natures without pain exacerbation in shoulder to allow for return to regular exercise at DC.  Baseline: 1/22: not released from protocol yet Goal status: ongoing   PLAN:  PT FREQUENCY: 1-2x/week  PT DURATION: 3 months   PLANNED INTERVENTIONS: 97110-Therapeutic exercises, 97530- Therapeutic activity, 97112- Neuromuscular re-education, 97535- Self Care, 16109- Manual therapy, 97014- Electrical stimulation (unattended), [id number]- Electrical stimulation (manual), Patient/Family education, and Moist heat  PLAN FOR NEXT SESSION: continue per Shoulder protocol - week 7 into 8- Continue to progress more AAROM/AROM right Shoulder as appropriate.   9:16 AM, 03/04/23     Lenda Kelp, PT 03/04/2023, 9:16 AM

## 2023-03-05 ENCOUNTER — Ambulatory Visit
Payer: 59 | Attending: Student in an Organized Health Care Education/Training Program | Admitting: Student in an Organized Health Care Education/Training Program

## 2023-03-05 ENCOUNTER — Other Ambulatory Visit: Payer: Self-pay

## 2023-03-05 ENCOUNTER — Encounter: Payer: Self-pay | Admitting: Student in an Organized Health Care Education/Training Program

## 2023-03-05 VITALS — BP 149/92 | HR 73 | Temp 98.1°F | Resp 16 | Ht 59.0 in | Wt 165.0 lb

## 2023-03-05 DIAGNOSIS — M7591 Shoulder lesion, unspecified, right shoulder: Secondary | ICD-10-CM | POA: Insufficient documentation

## 2023-03-05 DIAGNOSIS — M25511 Pain in right shoulder: Secondary | ICD-10-CM | POA: Diagnosis not present

## 2023-03-05 DIAGNOSIS — G5711 Meralgia paresthetica, right lower limb: Secondary | ICD-10-CM | POA: Insufficient documentation

## 2023-03-05 DIAGNOSIS — Z96641 Presence of right artificial hip joint: Secondary | ICD-10-CM | POA: Diagnosis not present

## 2023-03-05 DIAGNOSIS — M7581 Other shoulder lesions, right shoulder: Secondary | ICD-10-CM | POA: Diagnosis not present

## 2023-03-05 DIAGNOSIS — M25551 Pain in right hip: Secondary | ICD-10-CM | POA: Insufficient documentation

## 2023-03-05 DIAGNOSIS — G8929 Other chronic pain: Secondary | ICD-10-CM | POA: Insufficient documentation

## 2023-03-05 MED ORDER — OXYCODONE HCL 5 MG PO TABS
5.0000 mg | ORAL_TABLET | Freq: Every day | ORAL | 0 refills | Status: AC | PRN
  Filled 2023-03-05: qty 30, 30d supply, fill #0

## 2023-03-05 NOTE — Progress Notes (Signed)
PROVIDER NOTE: Information contained herein reflects review and annotations entered in association with encounter. Interpretation of such information and data should be left to medically-trained personnel. Information provided to patient can be located elsewhere in the medical record under "Patient Instructions". Document created using STT-dictation technology, any transcriptional errors that may result from process are unintentional.    Patient: Shelby Henson  Service Category: E/M  Provider: Edward Jolly, MD  DOB: 1970-11-16  DOS: 03/05/2023  Referring Provider: Huel Cote, MD  MRN: [id number]  Specialty: Interventional Pain Management  PCP: Alba Cory, MD  Type: Established Patient  Setting: Ambulatory outpatient    Location: Office  Delivery: Face-to-face     HPI  Shelby Henson, a 53 y.o. year old female, is here today because of her Supraspinatus tendonitis, right [M75.91]. Ms. Gallaga primary complain today is Shoulder Pain (right)   Pain Assessment: Severity of Chronic pain is reported as a 6 /10. Location: Shoulder Right/denies. Onset: More than a month ago. Quality: Aching, Dull. Timing: Constant. Modifying factor(s): nothing. Vitals:  height is 4\' 11"  (1.499 m) and weight is 165 lb (74.8 kg). Her temporal temperature is 98.1 F (36.7 C). Her blood pressure is 149/92 (abnormal) and her pulse is 73. Her respiration is 16 and oxygen saturation is 97%.  BMI: Estimated body mass index is 33.33 kg/m as calculated from the following:   Height as of this encounter: 4\' 11"  (1.499 m).   Weight as of this encounter: 165 lb (74.8 kg). Last encounter: 11/05/2022. Last procedure: 10/08/2022.  Reason for encounter:   History of Present Illness   Shelby Henson is a 53 year old female who presents with post-operative pain following rotator cuff surgery.  She underwent rotator cuff surgery in December and is experiencing post-operative pain. The pain is described as radiating and severe,  particularly after recent physical therapy sessions.  The pain is primarily associated with physical therapy, which occurs twice a week. She was not scheduled for physical therapy this week except for one session, which she found difficult to manage due to the pain. The pain is significant enough to cause nausea.  She has been using Tylenol for pain management, which was initially effective, but is no longer sufficient following recent therapy sessions. She premedicates with Tylenol before physical therapy sessions. She has tried using ice for pain relief, which she finds helpful, but notes that she cannot keep the ice on as long as she would like due to availability issues.  She is allergic to codeine, which causes a rash, and prefers to use oxycodone for pain management on physical therapy days and the day after, as it has been more effective for her.       ROS  Constitutional: Denies any fever or chills Gastrointestinal: No reported hemesis, hematochezia, vomiting, or acute GI distress Musculoskeletal:  right shoulder pain Neurological: No reported episodes of acute onset apraxia, aphasia, dysarthria, agnosia, amnesia, paralysis, loss of coordination, or loss of consciousness  Medication Review  Albuterol-Budesonide, Fluticasone-Umeclidin-Vilant, Galcanezumab-gnlm, acetaminophen, albuterol, aspirin EC, desvenlafaxine, diclofenac Sodium, famotidine, fluconazole, gabapentin, lidocaine, oxyCODONE, pantoprazole, prazosin, and traMADol  History Review  Allergy: [name] is allergic to celecoxib, prednisone, topamax [topiramate], zolpidem, codeine, and penicillins. Drug: [name]  reports no history of drug use. Alcohol:  reports current alcohol use. Tobacco:  reports that she has never smoked. She has never used smokeless tobacco. Social: Ms. Rohrer  reports that she has never smoked. She has never used smokeless tobacco. She reports  current alcohol use. She reports that she does not use  drugs. Medical:  has a past medical history of Anemia, Asthma, Colon polyps, Complication of anesthesia, DDD (degenerative disc disease), lumbar, Depression, GERD (gastroesophageal reflux disease), Iron deficiency anemia, Migraine, Morbid obesity (HCC), Osteoarthritis, Osteoarthritis of right hip, PONV (postoperative nausea and vomiting), Right rotator cuff tear (11/2022), and Vitamin D deficiency. Surgical: [name]  has a past surgical history that includes Supracervical abdominal hysterectomy (2010); Cesarean section (1990); Cesarean section (1993); LEEP (2005); laparoscopy (2018); Breast cyst aspiration (Left); Cesarean section (1996); Total hip arthroplasty (Right, 11/12/2018); Colonoscopy (N/A, 09/22/2020); Shoulder arthroscopy with rotator cuff repair and open biceps tenodesis (Right, 01/03/2023); and Shoulder acromioplasty (Right, 01/03/2023). Family: family history includes Bipolar disorder in her brother; Colon cancer in her maternal grandfather; Depression in her mother.  Laboratory Chemistry Profile   Renal Lab Results  Component Value Date   BUN 15 05/11/2022   CREATININE 0.70 05/11/2022   BCR SEE NOTE: 05/11/2022   GFRAA 122 09/08/2019   GFRNONAA >60 12/08/2019    Hepatic Lab Results  Component Value Date   AST 26 05/11/2022   ALT 25 05/11/2022   ALBUMIN 4.1 09/17/2018   ALKPHOS 94 09/17/2018   HCVAB 0.1 09/17/2018    Electrolytes Lab Results  Component Value Date   NA 141 05/11/2022   K 4.2 05/11/2022   CL 104 05/11/2022   CALCIUM 9.9 05/11/2022    Bone Lab Results  Component Value Date   VD25OH 23 (L) 05/11/2022    Inflammation (CRP: Acute Phase) (ESR: Chronic Phase) No results found for: "CRP", "ESRSEDRATE", "LATICACIDVEN"       Note: Above Lab results reviewed.  Recent Imaging Review  [place] OR NERVE BLOCK-IMAGE ONLY [facility]) There is no interpretation for this exam.    This order is for images obtained during a surgical procedure.  Please See  "Surgeries"  Tab for more information regarding the procedure. Note: Reviewed        Physical Exam  General appearance: Well nourished, well developed, and well hydrated. In no apparent acute distress Mental status: Alert, oriented x 3 (person, place, & time)       Respiratory: No evidence of acute respiratory distress Eyes: PERLA Vitals: BP (!) 149/92   Pulse 73   Temp 98.1 F (36.7 C) (Temporal)   Resp 16   Ht 4\' 11"  (1.499 m)   Wt 165 lb (74.8 kg)   SpO2 97%   BMI 33.33 kg/m  BMI: Estimated body mass index is 33.33 kg/m as calculated from the following:   Height as of this encounter: 4\' 11"  (1.499 m).   Weight as of this encounter: 165 lb (74.8 kg). Ideal: Patient must be at least 60 in tall to calculate ideal body weight  Right shoulder pain, increased pain with shoulder abduction  Assessment   Diagnosis Status  1. Supraspinatus tendonitis, right   2. Right rotator cuff tendonitis   3. Chronic right shoulder pain   4. Meralgia paraesthetica, right   5. Chronic hip pain after total replacement of right hip joint   6. Chronic right hip pain    Controlled Controlled Controlled   Updated Problems: No problems updated.  Plan of Care    Postoperative pain from December's rotator cuff surgery has worsened, particularly with physical therapy, and is not adequately managed with Tylenol. Preferring medication only on PT days and being allergic to codeine, oxycodone was chosen over low-dose hydrocodone. Surgery may not alleviate all pain, and nerve  blocks could be considered if pain persists. Emphasized short-term use of pain medication, only as needed for PT days. Prescribe oxycodone, 30 tablets, for use on PT days and the day after. Advise continued use of ice for pain relief. Follow-up is open-ended; contact if further pain management, such as injections, is needed.       Ms. SERENAH MILL has a current medication list which includes the following long-term medication(s): albuterol,  desvenlafaxine, famotidine, gabapentin, pantoprazole, and prazosin.  Pharmacotherapy (Medications Ordered): Meds ordered this encounter  Medications   oxyCODONE (OXY IR/ROXICODONE) 5 MG immediate release tablet    Sig: Take 1 tablet (5 mg total) by mouth daily as needed for severe pain (pain score 7-10). Must last 30 days.    Dispense:  30 tablet    Refill:  0    Chronic Pain: STOP Act (Not applicable) Fill 1 day early if closed on refill date. Avoid benzodiazepines within 8 hours of opioids   Orders:  No orders of the defined types were placed in this encounter.  Follow-up plan:   Return for patient will call to schedule F2F appt prn.      Recent Visits No visits were found meeting these conditions. Showing recent visits within past 90 days and meeting all other requirements Today's Visits Date Type Provider Dept  03/05/23 Office Visit Edward Jolly, MD [facility]  Showing today's visits and meeting all other requirements Future Appointments No visits were found meeting these conditions. Showing future appointments within next 90 days and meeting all other requirements  I discussed the assessment and treatment plan with the patient. The patient was provided an opportunity to ask questions and all were answered. The patient agreed with the plan and demonstrated an understanding of the instructions.  Patient advised to call back or seek an in-person evaluation if the symptoms or condition worsens.  Duration of encounter: .  Total time on encounter, as per AMA guidelines included both the face-to-face and non-face-to-face time personally spent by the physician and/or other qualified health care professional(s) on the day of the encounter (includes time in activities that require the physician or other qualified health care professional and does not include time in activities normally performed by clinical staff). Physician's time may include the following  activities when performed: Preparing to see the patient (e.g., pre-charting review of records, searching for previously ordered imaging, lab work, and nerve conduction tests) Review of prior analgesic pharmacotherapies. Reviewing PMP Interpreting ordered tests (e.g., lab work, imaging, nerve conduction tests) Performing post-procedure evaluations, including interpretation of diagnostic procedures Obtaining and/or reviewing separately obtained history Performing a medically appropriate examination and/or evaluation Counseling and educating the patient/family/caregiver Ordering medications, tests, or procedures Referring and communicating with other health care professionals (when not separately reported) Documenting clinical information in the electronic or other health record Independently interpreting results (not separately reported) and communicating results to the patient/ family/caregiver Care coordination (not separately reported)  Note by: Edward Jolly, MD Date: 03/05/2023; Time: 2:25 PM

## 2023-03-05 NOTE — Patient Instructions (Signed)

## 2023-03-07 ENCOUNTER — Ambulatory Visit: Payer: 59 | Admitting: Obstetrics and Gynecology

## 2023-03-07 ENCOUNTER — Encounter (HOSPITAL_BASED_OUTPATIENT_CLINIC_OR_DEPARTMENT_OTHER): Payer: Self-pay | Admitting: Orthopaedic Surgery

## 2023-03-07 NOTE — Therapy (Signed)
 OUTPATIENT PHYSICAL THERAPY SHOULDER TREATMENT/     Patient Name: Shelby Henson MRN: [id number] DOB:07/30/1970, 53 y.o., female Today's Date: 03/11/2023  END OF SESSION:  PT End of Session - 03/11/23 0713     Visit Number 13    Number of Visits 24    Date for PT Re-Evaluation 04/07/23    Authorization Type [employer] FOCUS    Authorization Time Period 01/07/23-04/07/23    Progress Note Due on Visit 10    PT Start Time 0714    PT Stop Time 0759    PT Time Calculation (min) 45 min    Activity Tolerance Patient tolerated treatment well    Behavior During Therapy [facility] for tasks assessed/performed                       Past Medical History:  Diagnosis Date   Anemia    Asthma    well controlled   Colon polyps    Complication of anesthesia    DDD (degenerative disc disease), lumbar    Depression    GERD (gastroesophageal reflux disease)    occ   Iron  deficiency anemia    Migraine    Morbid obesity (HCC)    Osteoarthritis    Osteoarthritis of right hip    PONV (postoperative nausea and vomiting)    with c-section x 1   Right rotator cuff tear 11/2022   Vitamin D  deficiency    Past Surgical History:  Procedure Laterality Date   BREAST CYST ASPIRATION Left    CESAREAN SECTION  1990   CESAREAN SECTION  1993   CESAREAN SECTION  1996   COLONOSCOPY N/A 09/22/2020   Procedure: COLONOSCOPY;  Surgeon: Irby Mannan, MD;  Location: [facility] ENDOSCOPY;  Service: Endoscopy;  Laterality: N/A;   LAPAROSCOPY  2018   LEEP  2005   benign pathology; Dr. Laney Piper   SHOULDER ACROMIOPLASTY Right 01/03/2023   Procedure: RIGHT SHOULDER ACROMIOPLASTY;  Surgeon: Wilhelmenia Harada, MD;  Location: [facility] ORS;  Service: Orthopedics;  Laterality: Right;   SHOULDER ARTHROSCOPY WITH ROTATOR CUFF REPAIR AND OPEN BICEPS TENODESIS Right 01/03/2023   Procedure: RIGHT SHOULDER ARTHROSCOPY WITH ROTATOR CUFF REPAIR AND  BICEPS TENODESIS;  Surgeon: Wilhelmenia Harada, MD;  Location: [facility] ORS;   Service: Orthopedics;  Laterality: Right;   SUPRACERVICAL ABDOMINAL HYSTERECTOMY  2010   leio/AUB; Dr. Laney Piper   TOTAL HIP ARTHROPLASTY Right 11/12/2018   Procedure: TOTAL HIP ARTHROPLASTY ANTERIOR APPROACH;  Surgeon: Jerlyn Moons, MD;  Location: [facility] ORS;  Service: Orthopedics;  Laterality: Right;   Patient Active Problem List   Diagnosis Date Noted   Nontraumatic incomplete tear of right rotator cuff 01/03/2023   Right rotator cuff tendonitis 09/11/2022   Iron  deficiency anemia due to dietary causes 08/10/2022   Asthma, moderate persistent, poorly-controlled 08/10/2022   Chronic right shoulder pain 05/11/2022   Morbid obesity (HCC) 01/05/2022   Rotator cuff impingement syndrome, right 09/29/2021   Biceps tendinitis, right 09/29/2021   Meralgia paresthetica of right side 03/29/2021   Psoas tendinitis, right hip 03/29/2021   History of total right hip arthroplasty 03/29/2021   Status post total replacement of right hip 02/19/2021   Meralgia paresthetica of right side 02/19/2021   Polyp of ascending colon 02/19/2021   Polyp of sigmoid colon 02/19/2021   Special screening for malignant neoplasms, colon    Polyp of ascending colon    Polyp of sigmoid colon    COVID-19 long hauler manifesting chronic loss of  smell and taste 03/23/2020   Pain in right hip 01/12/2019   Vitamin D  deficiency 02/25/2018   Chronic pain 10/24/2015   Hot flashes 10/24/2015   Migraine without aura and without status migrainosus, not intractable 10/24/2015   Metabolic syndrome 10/24/2015   Dyslipidemia 10/24/2015   Allergic rhinitis, seasonal 05/17/2015   Asthma in adult, mild intermittent, with acute exacerbation 08/08/2014   Chronic insomnia 08/08/2014   DDD (degenerative disc disease), lumbar 08/08/2014   Mood disorder (HCC) 08/08/2014   Gastroesophageal reflux disease without esophagitis 08/08/2014   H/O total hysterectomy 08/08/2014   Irritable bowel syndrome with diarrhea 08/08/2014    Osteoarthritis of right hip 08/08/2014   Adult BMI 30+ 08/08/2014   SBO (spina bifida occulta) 08/08/2014    PCP: Arleen Lacer, MD   REFERRING PROVIDER: Wilhelmenia Harada, MD orthopedics   REFERRING DIAG: Rt arthroscopic rotator cuff repair   THERAPY DIAG:  Right shoulder pain, unspecified chronicity  Stiffness of right shoulder, not elsewhere classified  Muscle weakness (generalized)  Rationale for Evaluation and Treatment: Rehabilitation  ONSET DATE: 01/03/23  SUBJECTIVE:                                                                                                                                                                                      SUBJECTIVE STATEMENT: Patient was very sore since last session.   Hand dominance: Right  PERTINENT HISTORY: Shelby Henson is a 53yo F presents to outpatient physical therapy on postop day 4 status post right arthroscopic shoulder debridement of supraspinatus tendon, anterior glenoid labrum, subacromial depression, rotator cuff repair with Dr. Wilhelmenia Harada.  Patient reports more than 1 year of insidious onset shoulder pain previously followed by sports medicine Dr. Augustus Ledger.  PAIN:  Are you having pain? 3/10 right shoulder: Worst pain was 8 out of 10 while sleeping overnight 2 nights ago.  PRECAUTIONS: Patient in sling immobilizer and nonweightbearing x 2 weeks.  Awaiting updates pending follow-up visit with orthopedics.   WEIGHT BEARING RESTRICTIONS: Yes, right upper extremity nonweightbearing  FALLS:  Has patient fallen in last 6 months?  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 PATIENT SURVEYS:  FOTO:  4  SENSATION: WNL  POSTURE: WNL  UPPER EXTREMITY ROM:    ROM Right eval Left eval  Shoulder flexion 90   Shoulder extension NT   Shoulder abduction NT   Shoulder internal rotation full   Shoulder external  rotation NT   Elbow flexion full   Elbow extension full    Wrist flexion full    Wrist extension full    Wrist ulnar deviation full    Wrist radial deviation full    forearm pronation full    forearm supination full    (Blank rows = not tested)    PALPATION:  Tenderness at the posterior shoulder portal wound while adjusting/donning sling   *PROTOCOL LOCATED IN MEDIA SECTION OF EPIC   TODAY'S TREATMENT:                                                                                                                                         DATE: 03/11/23  Manual therapy: (supine)  Gentle grade 1-2 GH joint mobs (inf/PA/AP) x several min Bicep lengthening stretch with pressure x multiple reps  TherEx: Gentle PROM right shoulder flex  improved over time Gentle PROM right shoulder x several min Gentle PROM right shoulder ER- up to 40 deg x several min- no pain  Isometrics: 40-50% muscle activation flexion, abduction, extension, adduction   Finger walk:  -flexion 5x -abduction 5x  AROM supine: flexion 10x; 2 sets with holds; abduction 10x; 2 sets with holds, IR/ER to belly to neutral 10x; 2 sets  UE ranger:  Flexion/extension 10x     PATIENT EDUCATION: Education details: See above- extensive education this visit  Person educated: patient  Education method: Research scientist (medical), deliberate practice, positive reinforcement, explicit instruction, establish rules,  Education comprehension: adequate   HOME EXERCISE PROGRAM: Access Code: [email or web address] URL: [email or web address] Date: 01/07/2023 Prepared by: Atlee Blanks  Exercises - Seated Cervical Sidebending Stretch  - 1-2 x daily - 3 sets - 30 hold - Seated Assisted Cervical Rotation with  Towel  - 1-2 x daily - 3 sets - 30 hold - Sidelying Thoracic Rotation Book Openings- Both sides  - 1 x daily - 1-2 x weekly - 1 sets - 20 reps - 1-2sec hold - Supine Cervical Retraction with Towel  - 2 x daily - 7 x weekly - 1 sets - 15 reps - 3-5sec hold - Shoulder external rotation, scapular retraction RED band  - 1 x daily - 3 x weekly - 3 sets - 15 reps - Standing Bilateral Low Shoulder Row with Anchored Resistance  - 1 x daily - 7 x weekly - 3 sets - 15 reps  Access Code: [email or web address] URL: [email or web address] Date: 02/04/2023 Prepared by: Arzu Mcgaughey  Exercises - Supine Shoulder Flexion AAROM with Dowel  - 1 x daily - 7 x weekly - 2 sets - 10 reps - 5 hold - Supine Shoulder Abduction AAROM with Dowel  - 1 x daily - 7 x weekly - 2 sets - 10 reps - 5 hold - Supine Shoulder External Rotation with Dowel  - 1 x daily - 7 x weekly - 2 sets - 10 reps - 5 hold -  Seated Scapular Retraction  - 1 x daily - 7 x weekly - 2 sets - 10 reps - 5 hold  ASSESSMENT:  CLINICAL IMPRESSION: Patient is highly motivated throughout session. Introduction to isometrics tolerated well with no pain increase. Overhead AAROM motions are pain decreasing. Patient will benefit from skilled PT services to fully rehabilitate this operative shoulder to allow for full return to PLOF in UE use for ADL, IADL, and fitness/leisure.   OBJECTIVE IMPAIRMENTS: decreased activity tolerance, decreased endurance, decreased knowledge of condition, decreased ROM, decreased strength, increased muscle spasms, impaired flexibility, and improper body mechanics.   ACTIVITY LIMITATIONS: carrying, lifting, bathing, toileting, dressing, self feeding, reach over head, and hygiene/grooming  PARTICIPATION LIMITATIONS: meal prep, cleaning, laundry, driving, shopping, community activity, occupation, and yard work  PERSONAL FACTORS: Age, Behavior pattern, Education, Fitness, Past/current experiences, Profession, Social background, Time  since onset of injury/illness/exacerbation, and Transportation are also affecting patient's functional outcome.   REHAB POTENTIAL: Excellent  CLINICAL DECISION MAKING: Stable/uncomplicated  EVALUATION COMPLEXITY: Low   GOALS: Goals reviewed with patient? No  SHORT TERM GOALS: Target date: 02/07/23  Pt to demonstrate Rt shoulder P/ROM flexion to >120 degrees, ABDCT >90 degrees, ER >45 degrees to improve ability to perform ADL/selfcare.  Baseline: 1/22: flexion: 70, abduction 38, scaption 71 Goal status:Partially Met   2.  Pt to report successful HEP performance and successful adherence to postoperative restrictions.  Baseline: HEP compliant Goal status:MET  3.  Pt to demonstrate 3/5 Rt shoulder MMT to 90 degrees of flexion and 90 degrees of abduction.  Baseline: unable 1/22: not able to due to protocol Goal status: in progress  LONG TERM GOALS: Target date: 04/07/23  Pt to improve FOTO survey goal by >60 points to indicate a monstrous reduction in difficulty of Rt limb use in a variety of questionable activities.  Baseline: 4 1/22: 44 Goal status: Partially Met   2.  Pt to demonstrate 5/5 Rt shoulder flexion, abduction, ER, IR, extension; 5/5 Rt elbow flexion/extension  Baseline: 1/22: deferred due to protocol Goal status:ongoing   3.  Pt to demonstrate >155 degrees Rt shoulder A/ROM without end-range pain>2/10.  Baseline: 1/22: flexion: 70, abduction 38, scaption 71 Goal status: Partially Met  4.  Pt to demonstrate ability to perform gym-type exercises of resistance and aerobic natures without pain exacerbation in shoulder to allow for return to regular exercise at DC.  Baseline: 1/22: not released from protocol yet Goal status: ongoing   PLAN:  PT FREQUENCY: 1-2x/week  PT DURATION: 3 months   PLANNED INTERVENTIONS: 97110-Therapeutic exercises, 97530- Therapeutic activity, 97112- Neuromuscular re-education, 97535- Self Care, 16109- Manual therapy, 97014- Electrical  stimulation (unattended), [id number]- Electrical stimulation (manual), Patient/Family education, and Moist heat  PLAN FOR NEXT SESSION: continue per Shoulder protocol - week 4  7:59 AM, 03/11/23     Tagen Brethauer, PT 03/11/2023, 7:59 AM

## 2023-03-08 ENCOUNTER — Other Ambulatory Visit: Payer: Self-pay

## 2023-03-08 ENCOUNTER — Other Ambulatory Visit: Payer: Self-pay | Admitting: Family Medicine

## 2023-03-08 ENCOUNTER — Other Ambulatory Visit (HOSPITAL_BASED_OUTPATIENT_CLINIC_OR_DEPARTMENT_OTHER): Payer: Self-pay | Admitting: Student

## 2023-03-08 DIAGNOSIS — F5104 Psychophysiologic insomnia: Secondary | ICD-10-CM

## 2023-03-08 MED ORDER — MELOXICAM 15 MG PO TABS
15.0000 mg | ORAL_TABLET | Freq: Every day | ORAL | 0 refills | Status: AC
  Filled 2023-03-08: qty 14, 14d supply, fill #0

## 2023-03-11 ENCOUNTER — Other Ambulatory Visit: Payer: Self-pay

## 2023-03-11 ENCOUNTER — Ambulatory Visit: Payer: 59

## 2023-03-11 ENCOUNTER — Other Ambulatory Visit: Payer: Self-pay | Admitting: Family Medicine

## 2023-03-11 DIAGNOSIS — F5104 Psychophysiologic insomnia: Secondary | ICD-10-CM

## 2023-03-11 DIAGNOSIS — M6281 Muscle weakness (generalized): Secondary | ICD-10-CM | POA: Diagnosis not present

## 2023-03-11 DIAGNOSIS — M25611 Stiffness of right shoulder, not elsewhere classified: Secondary | ICD-10-CM

## 2023-03-11 DIAGNOSIS — M25511 Pain in right shoulder: Secondary | ICD-10-CM | POA: Diagnosis not present

## 2023-03-11 DIAGNOSIS — M25619 Stiffness of unspecified shoulder, not elsewhere classified: Secondary | ICD-10-CM | POA: Diagnosis not present

## 2023-03-12 ENCOUNTER — Ambulatory Visit: Payer: 59 | Admitting: Student in an Organized Health Care Education/Training Program

## 2023-03-12 ENCOUNTER — Other Ambulatory Visit: Payer: Self-pay

## 2023-03-12 MED FILL — Prazosin HCl Cap 2 MG: ORAL | 90 days supply | Qty: 90 | Fill #0 | Status: AC

## 2023-03-14 NOTE — Therapy (Signed)
OUTPATIENT PHYSICAL THERAPY SHOULDER TREATMENT/     Patient Name: Shelby Henson MRN: [id number] DOB:[birth year], 53 y.o., female Today's Date: 03/18/2023  END OF SESSION:  PT End of Session - 03/18/23 0949     Visit Number 14    Number of Visits 24    Date for PT Re-Evaluation 04/07/23    Authorization Type [facility] [employer] FOCUS    Authorization Time Period 01/07/23-04/07/23    Progress Note Due on Visit 10    PT Start Time 0714    PT Stop Time 0758    PT Time Calculation (min) 44 min    Activity Tolerance Patient tolerated treatment well    Behavior During Therapy [facility] for tasks assessed/performed                        Past Medical History:  Diagnosis Date   Anemia    Asthma    well controlled   Colon polyps    Complication of anesthesia    DDD (degenerative disc disease), lumbar    Depression    GERD (gastroesophageal reflux disease)    occ   Iron deficiency anemia    Migraine    Morbid obesity (HCC)    Osteoarthritis    Osteoarthritis of right hip    PONV (postoperative nausea and vomiting)    with c-section x 1   Right rotator cuff tear 11/2022   Vitamin D deficiency    Past Surgical History:  Procedure Laterality Date   BREAST CYST ASPIRATION Left    CESAREAN SECTION  1990   CESAREAN SECTION  1993   CESAREAN SECTION  1996   COLONOSCOPY N/A 09/22/2020   Procedure: COLONOSCOPY;  Surgeon: Pasty Spillers, MD;  Location: [facility] END[facility];  Service: Endoscopy;  Laterality: N/A;   LAPAROSCOPY  2018   LEEP  2005   benign pathology; Dr. Luella Cook   SHOULDER ACROMIOPLASTY Right 01/03/2023   Procedure: RIGHT SHOULDER ACROMIOPLASTY;  Surgeon: Huel Cote, MD;  Location: [facility] ORS;  Service: Orthopedics;  Laterality: Right;   SHOULDER ARTHROSCOPY WITH ROTATOR CUFF REPAIR AND OPEN BICEPS TENODESIS Right 01/03/2023   Procedure: RIGHT SHOULDER ARTHROSCOPY WITH ROTATOR CUFF REPAIR AND  BICEPS TENODESIS;  Surgeon: Huel Cote, MD;  Location: [facility] ORS;   Service: Orthopedics;  Laterality: Right;   SUPRACERVICAL ABDOMINAL HYSTERECTOMY  2010   leio/AUB; Dr. Luella Cook   TOTAL HIP ARTHROPLASTY Right 11/12/2018   Procedure: TOTAL HIP ARTHROPLASTY ANTERIOR APPROACH;  Surgeon: Lyndle Herrlich, MD;  Location: [facility] ORS;  Service: Orthopedics;  Laterality: Right;   Patient Active Problem List   Diagnosis Date Noted   Nontraumatic incomplete tear of right rotator cuff 01/03/2023   Right rotator cuff tendonitis 09/11/2022   Iron deficiency anemia due to dietary causes 08/10/2022   Asthma, moderate persistent, poorly-controlled 08/10/2022   Chronic right shoulder pain 05/11/2022   Morbid obesity (HCC) 01/05/2022   Rotator cuff impingement syndrome, right 09/29/2021   Biceps tendinitis, right 09/29/2021   Meralgia paresthetica of right side 03/29/2021   Psoas tendinitis, right hip 03/29/2021   History of total right hip arthroplasty 03/29/2021   Status post total replacement of right hip 02/19/2021   Meralgia paresthetica of right side 02/19/2021   Polyp of ascending colon 02/19/2021   Polyp of sigmoid colon 02/19/2021   Special screening for malignant neoplasms, colon    Polyp of ascending colon    Polyp of sigmoid colon    COVID-19 long hauler manifesting chronic loss  of smell and taste 03/23/2020   Pain in right hip 01/12/2019   Vitamin D deficiency 02/25/2018   Chronic pain 10/24/2015   Hot flashes 10/24/2015   Migraine without aura and without status migrainosus, not intractable 10/24/2015   Metabolic syndrome 10/24/2015   Dyslipidemia 10/24/2015   Allergic rhinitis, seasonal 05/17/2015   Asthma in adult, mild intermittent, with acute exacerbation 08/08/2014   Chronic insomnia 08/08/2014   DDD (degenerative disc disease), lumbar 08/08/2014   Mood disorder (HCC) 08/08/2014   Gastroesophageal reflux disease without esophagitis 08/08/2014   H/O total hysterectomy 08/08/2014   Irritable bowel syndrome with diarrhea 08/08/2014    Osteoarthritis of right hip 08/08/2014   Adult BMI 30+ 08/08/2014   SBO (spina bifida occulta) 08/08/2014    PCP: Alba Cory, MD   REFERRING PROVIDER: Huel Cote, MD orthopedics   REFERRING DIAG: Rt arthroscopic rotator cuff repair   THERAPY DIAG:  Right shoulder pain, unspecified chronicity  Stiffness of right shoulder, not elsewhere classified  Muscle weakness (generalized)  Rationale for Evaluation and Treatment: Rehabilitation  ONSET DATE: 01/03/23  SUBJECTIVE:                                                                                                                                                                                      SUBJECTIVE STATEMENT: Patient reports some stiffness in her shoulder.   Hand dominance: Right  PERTINENT HISTORY: Shelby Henson is a 53yo F presents to outpatient physical therapy on postop day 4 status post right arthroscopic shoulder debridement of supraspinatus tendon, anterior glenoid labrum, subacromial depression, rotator cuff repair with Dr. Huel Cote.  Patient reports more than 1 year of insidious onset shoulder pain previously followed by sports medicine Dr. Ashley Royalty.  PAIN:  Are you having pain? 3/10 right shoulder: Worst pain was 8 out of 10 while sleeping overnight 2 nights ago.  PRECAUTIONS: Patient in sling immobilizer and nonweightbearing x 2 weeks.  Awaiting updates pending follow-up visit with orthopedics.   WEIGHT BEARING RESTRICTIONS: Yes, right upper extremity nonweightbearing  FALLS:  Has patient fallen in last 6 months?  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 PATIENT SURVEYS:  FOTO:  4  SENSATION: WNL  POSTURE: WNL  UPPER EXTREMITY ROM:    ROM Right eval Left eval  Shoulder flexion 90   Shoulder extension NT   Shoulder abduction NT   Shoulder internal rotation full   Shoulder  external rotation NT   Elbow flexion full   Elbow extension full    Wrist flexion full    Wrist extension full    Wrist ulnar deviation full    Wrist radial deviation full    forearm pronation full    forearm supination full    (Blank rows = not tested)    PALPATION:  Tenderness at the posterior shoulder portal wound while adjusting/donning sling   *PROTOCOL LOCATED IN MEDIA SECTION OF EPIC   TODAY'S TREATMENT:                                                                                                                                         DATE: 03/18/23  Manual therapy: (supine)  Gentle grade 1-2 GH joint mobs (inf/PA/AP) x several min Bicep lengthening stretch with pressure x multiple reps  TherEx: Gentle PROM right shoulder flex  improved over time Gentle PROM right shoulder x several min Gentle PROM right shoulder ER- up to 40 deg x several min- no pain  Isometrics: 40-50% muscle activation flexion, abduction, extension, adduction   Finger walk:  -flexion 5x -abduction 5x  AROM supine: flexion 10x; 2 sets with holds; abduction 10x; 2 sets with holds, IR/ER to belly to neutral 10x; 2 sets  UE ranger:  Flexion/extension 10x Alphabet x1     PATIENT EDUCATION: Education details: See above- extensive education this visit  Person educated: patient  Education method: Research scientist (medical), deliberate practice, positive reinforcement, explicit instruction, establish rules,  Education comprehension: adequate   HOME EXERCISE PROGRAM: Access Code: [email or web address] URL: [email or web address] Date: 01/07/2023 Prepared by: Alvera Novel  Exercises - Seated Cervical Sidebending Stretch  - 1-2 x daily - 3 sets - 30 hold - Seated Assisted Cervical  Rotation with Towel  - 1-2 x daily - 3 sets - 30 hold - Sidelying Thoracic Rotation Book Openings- Both sides  - 1 x daily - 1-2 x weekly - 1 sets - 20 reps - 1-2sec hold - Supine Cervical Retraction with Towel  - 2 x daily - 7 x weekly - 1 sets - 15 reps - 3-5sec hold - Shoulder external rotation, scapular retraction RED band  - 1 x daily - 3 x weekly - 3 sets - 15 reps - Standing Bilateral Low Shoulder Row with Anchored Resistance  - 1 x daily - 7 x weekly - 3 sets - 15 reps  Access Code: [email or web address] URL: [email or web address] Date: 02/04/2023 Prepared by: Precious Bard  Exercises - Supine Shoulder Flexion AAROM with Dowel  - 1 x daily - 7 x weekly - 2 sets - 10 reps - 5 hold - Supine Shoulder Abduction AAROM with Dowel  - 1 x daily - 7 x weekly - 2 sets - 10 reps - 5 hold - Supine Shoulder External Rotation with Dowel  - 1 x daily - 7 x weekly - 2 sets - 10 reps -  5 hold - Seated Scapular Retraction  - 1 x daily - 7 x weekly - 2 sets - 10 reps - 5 hold  ASSESSMENT:  CLINICAL IMPRESSION: Patient has decreased pain with PROM interventions. She is highly motivated to reduce pain and improve her functionality. Abduction AROM is challenging for her at this time.  Patient will benefit from skilled PT services to fully rehabilitate this operative shoulder to allow for full return to PLOF in UE use for ADL, IADL, and fitness/leisure.   OBJECTIVE IMPAIRMENTS: decreased activity tolerance, decreased endurance, decreased knowledge of condition, decreased ROM, decreased strength, increased muscle spasms, impaired flexibility, and improper body mechanics.   ACTIVITY LIMITATIONS: carrying, lifting, bathing, toileting, dressing, self feeding, reach over head, and hygiene/grooming  PARTICIPATION LIMITATIONS: meal prep, cleaning, laundry, driving, shopping, community activity, occupation, and yard work  PERSONAL FACTORS: Age, Behavior pattern, Education, Fitness, Past/current experiences,  Profession, Social background, Time since onset of injury/illness/exacerbation, and Transportation are also affecting patient's functional outcome.   REHAB POTENTIAL: Excellent  CLINICAL DECISION MAKING: Stable/uncomplicated  EVALUATION COMPLEXITY: Low   GOALS: Goals reviewed with patient? No  SHORT TERM GOALS: Target date: 02/07/23  Pt to demonstrate Rt shoulder P/ROM flexion to >120 degrees, ABDCT >90 degrees, ER >45 degrees to improve ability to perform ADL/selfcare.  Baseline: 1/22: flexion: 70, abduction 38, scaption 71 Goal status:Partially Met   2.  Pt to report successful HEP performance and successful adherence to postoperative restrictions.  Baseline: HEP compliant Goal status:MET  3.  Pt to demonstrate 3/5 Rt shoulder MMT to 90 degrees of flexion and 90 degrees of abduction.  Baseline: unable 1/22: not able to due to protocol Goal status: in progress  LONG TERM GOALS: Target date: 04/07/23  Pt to improve FOTO survey goal by >60 points to indicate a monstrous reduction in difficulty of Rt limb use in a variety of questionable activities.  Baseline: 4 1/22: 44 Goal status: Partially Met   2.  Pt to demonstrate 5/5 Rt shoulder flexion, abduction, ER, IR, extension; 5/5 Rt elbow flexion/extension  Baseline: 1/22: deferred due to protocol Goal status:ongoing   3.  Pt to demonstrate >155 degrees Rt shoulder A/ROM without end-range pain>2/10.  Baseline: 1/22: flexion: 70, abduction 38, scaption 71 Goal status: Partially Met  4.  Pt to demonstrate ability to perform gym-type exercises of resistance and aerobic natures without pain exacerbation in shoulder to allow for return to regular exercise at DC.  Baseline: 1/22: not released from protocol yet Goal status: ongoing   PLAN:  PT FREQUENCY: 1-2x/week  PT DURATION: 3 months   PLANNED INTERVENTIONS: 97110-Therapeutic exercises, 97530- Therapeutic activity, 97112- Neuromuscular re-education, 97535- Self Care, 29562-  Manual therapy, 97014- Electrical stimulation (unattended), [phone]- Electrical stimulation (manual), Patient/Family education, and Moist heat  PLAN FOR NEXT SESSION: continue per Shoulder protocol - week 4  9:55 AM, 03/18/23     Precious Bard, PT 03/18/2023, 9:55 AM

## 2023-03-15 ENCOUNTER — Other Ambulatory Visit: Payer: Self-pay

## 2023-03-18 ENCOUNTER — Ambulatory Visit: Payer: 59

## 2023-03-18 DIAGNOSIS — M6281 Muscle weakness (generalized): Secondary | ICD-10-CM | POA: Diagnosis not present

## 2023-03-18 DIAGNOSIS — M25611 Stiffness of right shoulder, not elsewhere classified: Secondary | ICD-10-CM

## 2023-03-18 DIAGNOSIS — M25619 Stiffness of unspecified shoulder, not elsewhere classified: Secondary | ICD-10-CM | POA: Diagnosis not present

## 2023-03-18 DIAGNOSIS — M25511 Pain in right shoulder: Secondary | ICD-10-CM | POA: Diagnosis not present

## 2023-03-20 ENCOUNTER — Ambulatory Visit: Payer: 59 | Admitting: Pulmonary Disease

## 2023-03-21 NOTE — Therapy (Signed)
 OUTPATIENT PHYSICAL THERAPY SHOULDER TREATMENT/     Patient Name: Shelby Henson MRN: [id number] DOB:1971/01/20, 53 y.o., female Today's Date: 03/25/2023  END OF SESSION:  PT End of Session - 03/25/23 0729     Visit Number 15    Number of Visits 24    Date for PT Re-Evaluation 04/07/23    Authorization Type [facility] [employer] F[employer]    Authorization Time Period 01/07/23-04/07/23    Progress Note Due on Visit 10    PT Start Time 0712    PT Stop Time 0755    PT Time Calculation (min) 43 min    Activity Tolerance Patient tolerated treatment well    Behavior During Therapy [facility] for tasks assessed/performed                         Past Medical History:  Diagnosis Date   Anemia    Asthma    well controlled   Colon polyps    Complication of anesthesia    DDD (degenerative disc disease), lumbar    Depression    GERD (gastroesophageal reflux disease)    occ   Iron deficiency anemia    Migraine    Morbid obesity (HCC)    Osteoarthritis    Osteoarthritis of right hip    PONV (postoperative nausea and vomiting)    with c-section x 1   Right rotator cuff tear 11/2022   Vitamin D deficiency    Past Surgical History:  Procedure Laterality Date   BREAST CYST ASPIRATION Left    CESAREAN SECTION  1990   CESAREAN SECTION  1993   CESAREAN SECTION  1996   COLONOSCOPY N/A 09/22/2020   Procedure: COLONOSCOPY;  Surgeon: Pasty Spillers, MD;  Location: [facility] ENDOSCOPY;  Service: Endoscopy;  Laterality: N/A;   LAPAROSCOPY  2018   LEEP  2005   benign pathology; Dr. Luella Cook   SHOULDER ACROMIOPLASTY Right 01/03/2023   Procedure: RIGHT SHOULDER ACROMIOPLASTY;  Surgeon: Huel Cote, MD;  Location: [facility] ORS;  Service: Orthopedics;  Laterality: Right;   SHOULDER ARTHROSCOPY WITH ROTATOR CUFF REPAIR AND OPEN BICEPS TENODESIS Right 01/03/2023   Procedure: RIGHT SHOULDER ARTHROSCOPY WITH ROTATOR CUFF REPAIR AND  BICEPS TENODESIS;  Surgeon: Huel Cote, MD;  Location: [facility]  ORS;  Service: Orthopedics;  Laterality: Right;   SUPRACERVICAL ABDOMINAL HYSTERECTOMY  2010   leio/AUB; Dr. Luella Cook   TOTAL HIP ARTHROPLASTY Right 11/12/2018   Procedure: TOTAL HIP ARTHROPLASTY ANTERIOR APPROACH;  Surgeon: Lyndle Herrlich, MD;  Location: [facility] ORS;  Service: Orthopedics;  Laterality: Right;   Patient Active Problem List   Diagnosis Date Noted   Nontraumatic incomplete tear of right rotator cuff 01/03/2023   Right rotator cuff tendonitis 09/11/2022   Iron deficiency anemia due to dietary causes 08/10/2022   Asthma, moderate persistent, poorly-controlled 08/10/2022   Chronic right shoulder pain 05/11/2022   Morbid obesity (HCC) 01/05/2022   Rotator cuff impingement syndrome, right 09/29/2021   Biceps tendinitis, right 09/29/2021   Meralgia paresthetica of right side 03/29/2021   Psoas tendinitis, right hip 03/29/2021   History of total right hip arthroplasty 03/29/2021   Status post total replacement of right hip 02/19/2021   Meralgia paresthetica of right side 02/19/2021   Polyp of ascending colon 02/19/2021   Polyp of sigmoid colon 02/19/2021   Special screening for malignant neoplasms, colon    Polyp of ascending colon    Polyp of sigmoid colon    COVID-19 long hauler manifesting chronic  loss of smell and taste 03/23/2020   Pain in right hip 01/12/2019   Vitamin D deficiency 02/25/2018   Chronic pain 10/24/2015   Hot flashes 10/24/2015   Migraine without aura and without status migrainosus, not intractable 10/24/2015   Metabolic syndrome 10/24/2015   Dyslipidemia 10/24/2015   Allergic rhinitis, seasonal 05/17/2015   Asthma in adult, mild intermittent, with acute exacerbation 08/08/2014   Chronic insomnia 08/08/2014   DDD (degenerative disc disease), lumbar 08/08/2014   Mood disorder (HCC) 08/08/2014   Gastroesophageal reflux disease without esophagitis 08/08/2014   H/O total hysterectomy 08/08/2014   Irritable bowel syndrome with diarrhea 08/08/2014    Osteoarthritis of right hip 08/08/2014   Adult BMI 30+ 08/08/2014   SBO (spina bifida occulta) 08/08/2014    PCP: Alba Cory, MD   REFERRING PROVIDER: Huel Cote, MD orthopedics   REFERRING DIAG: Rt arthroscopic rotator cuff repair   THERAPY DIAG:  Right shoulder pain, unspecified chronicity  Stiffness of right shoulder, not elsewhere classified  Muscle weakness (generalized)  Limited range of motion (ROM) of shoulder  Rationale for Evaluation and Treatment: Rehabilitation  ONSET DATE: 01/03/23  SUBJECTIVE:                                                                                                                                                                                      SUBJECTIVE STATEMENT: Patient reports soreness but compliance with HEP.   Hand dominance: Right  PERTINENT HISTORY: Shelby Henson is a 53yo F presents to outpatient physical therapy on postop day 4 status post right arthroscopic shoulder debridement of supraspinatus tendon, anterior glenoid labrum, subacromial depression, rotator cuff repair with Dr. Huel Cote.  Patient reports more than 1 year of insidious onset shoulder pain previously followed by sports medicine Dr. Ashley Royalty.  PAIN:  Are you having pain? 3/10 right shoulder: Worst pain was 8 out of 10 while sleeping overnight 2 nights ago.  PRECAUTIONS: Patient in sling immobilizer and nonweightbearing x 2 weeks.  Awaiting updates pending follow-up visit with orthopedics.   WEIGHT BEARING RESTRICTIONS: Yes, right upper extremity nonweightbearing  FALLS:  Has patient fallen in last 6 months?  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 PATIENT SURVEYS:  FOTO: 4  SENSATION: WNL  POSTURE: WNL  UPPER EXTREMITY ROM:    ROM Right eval Left eval  Shoulder flexion 90   Shoulder extension NT   Shoulder abduction NT  Shoulder internal rotation full   Shoulder external rotation NT   Elbow flexion full   Elbow extension full    Wrist flexion full    Wrist extension full    Wrist ulnar deviation full    Wrist radial deviation full    forearm pronation full    forearm supination full    (Blank rows = not tested)    PALPATION:  Tenderness at the posterior shoulder portal wound while adjusting/donning sling   *PROTOCOL LOCATED IN MEDIA SECTION OF EPIC   TODAY'S TREATMENT:                                                                                                                                         DATE: 03/25/23  Manual therapy: (supine)  Gentle grade 1-2 GH joint mobs (inf/PA/AP) x several min Bicep lengthening stretch with pressure x multiple reps  TherEx: Gentle PROM right shoulder flex, abduction, ER, IR 10x  Isometrics: 40-50% muscle activation flexion, abduction, extension, adduction  Gentle AROM:  flexion, abduction, IR, ER supine position 10x each direction  Seated: - slide hands from knees to hips 10x -hands to head 10x -shoulder abduction 10x; modified cues for shoulder down.    Finger walk:  -flexion 5x -abduction 5x  UE ranger:  Flexion/extension 10x Alphabet x1     PATIENT EDUCATION: Education details: See above- extensive education this visit  Person educated: patient  Education method: collaborative learning, deliberate practice, positive reinforcement, explicit instruction, establish rules,  Education comprehension: adequate   HOME EXERCISE PROGRAM: Access Code: [email or web address] URL: [email or web address] Date: 01/07/2023 Prepared by: Alvera Novel  Exercises - Seated Cervical Sidebending Stretch  - 1-2 x daily - 3 sets - 30 hold - Seated  Assisted Cervical Rotation with Towel  - 1-2 x daily - 3 sets - 30 hold - Sidelying Thoracic Rotation Book Openings- Both sides  - 1 x daily - 1-2 x weekly - 1 sets - 20 reps - 1-2sec hold - Supine Cervical Retraction with Towel  - 2 x daily - 7 x weekly - 1 sets - 15 reps - 3-5sec hold - Shoulder external rotation, scapular retraction RED band  - 1 x daily - 3 x weekly - 3 sets - 15 reps - Standing Bilateral Low Shoulder Row with Anchored Resistance  - 1 x daily - 7 x weekly - 3 sets - 15 reps  Access Code: [email or web address] URL: [email or web address] Date: 02/04/2023 Prepared by: Precious Bard  Exercises - Supine Shoulder Flexion AAROM with Dowel  - 1 x daily - 7 x weekly - 2 sets - 10 reps - 5 hold - Supine Shoulder Abduction AAROM with Dowel  - 1 x daily - 7 x weekly - 2 sets - 10 reps - 5 hold - Supine Shoulder External Rotation with Dowel  - 1 x daily - 7 x weekly - 2 sets - 10 reps -  5 hold - Seated Scapular Retraction  - 1 x daily - 7 x weekly - 2 sets - 10 reps - 5 hold  ASSESSMENT:  CLINICAL IMPRESSION:   Patient tolerates increased AROM supine and seated positioning with decreased pain and improved range. She is highly motivated throughout session. Cueing for reducing shoulder shrug with movement. Patient will benefit from skilled PT services to fully rehabilitate this operative shoulder to allow for full return to PLOF in UE use for ADL, IADL, and fitness/leisure.   OBJECTIVE IMPAIRMENTS: decreased activity tolerance, decreased endurance, decreased knowledge of condition, decreased ROM, decreased strength, increased muscle spasms, impaired flexibility, and improper body mechanics.   ACTIVITY LIMITATIONS: carrying, lifting, bathing, toileting, dressing, self feeding, reach over head, and hygiene/grooming  PARTICIPATION LIMITATIONS: meal prep, cleaning, laundry, driving, shopping, community activity, occupation, and yard work  PERSONAL FACTORS: Age, Behavior pattern,  Education, Fitness, Past/current experiences, Profession, Social background, Time since onset of injury/illness/exacerbation, and Transportation are also affecting patient's functional outcome.   REHAB POTENTIAL: Excellent  CLINICAL DECISION MAKING: Stable/uncomplicated  EVALUATION COMPLEXITY: Low   GOALS: Goals reviewed with patient? No  SHORT TERM GOALS: Target date: 02/07/23  Pt to demonstrate Rt shoulder P/ROM flexion to >120 degrees, ABDCT >90 degrees, ER >45 degrees to improve ability to perform ADL/selfcare.  Baseline: 1/22: flexion: 70, abduction 38, scaption 71 Goal status:Partially Met   2.  Pt to report successful HEP performance and successful adherence to postoperative restrictions.  Baseline: HEP compliant Goal status:MET  3.  Pt to demonstrate 3/5 Rt shoulder MMT to 90 degrees of flexion and 90 degrees of abduction.  Baseline: unable 1/22: not able to due to protocol Goal status: in progress  LONG TERM GOALS: Target date: 04/07/23  Pt to improve FOTO survey goal by >60 points to indicate a monstrous reduction in difficulty of Rt limb use in a variety of questionable activities.  Baseline: 4 1/22: 44 Goal status: Partially Met   2.  Pt to demonstrate 5/5 Rt shoulder flexion, abduction, ER, IR, extension; 5/5 Rt elbow flexion/extension  Baseline: 1/22: deferred due to protocol Goal status:ongoing   3.  Pt to demonstrate >155 degrees Rt shoulder A/ROM without end-range pain>2/10.  Baseline: 1/22: flexion: 70, abduction 38, scaption 71 Goal status: Partially Met  4.  Pt to demonstrate ability to perform gym-type exercises of resistance and aerobic natures without pain exacerbation in shoulder to allow for return to regular exercise at DC.  Baseline: 1/22: not released from protocol yet Goal status: ongoing   PLAN:  PT FREQUENCY: 1-2x/week  PT DURATION: 3 months   PLANNED INTERVENTIONS: 97110-Therapeutic exercises, 97530- Therapeutic activity, 97112-  Neuromuscular re-education, 97535- Self Care, 65784- Manual therapy, 97014- Electrical stimulation (unattended), [phone]- Electrical stimulation (manual), Patient/Family education, and Moist heat  PLAN FOR NEXT SESSION: continue per Shoulder protocol - week 4  8:04 AM, 03/25/23     Precious Bard, PT 03/25/2023, 8:04 AM

## 2023-03-22 ENCOUNTER — Ambulatory Visit: Payer: 59 | Admitting: Family Medicine

## 2023-03-22 ENCOUNTER — Other Ambulatory Visit: Payer: Self-pay

## 2023-03-25 ENCOUNTER — Ambulatory Visit: Payer: 59

## 2023-03-25 DIAGNOSIS — M25619 Stiffness of unspecified shoulder, not elsewhere classified: Secondary | ICD-10-CM | POA: Diagnosis not present

## 2023-03-25 DIAGNOSIS — M6281 Muscle weakness (generalized): Secondary | ICD-10-CM | POA: Diagnosis not present

## 2023-03-25 DIAGNOSIS — M25611 Stiffness of right shoulder, not elsewhere classified: Secondary | ICD-10-CM | POA: Diagnosis not present

## 2023-03-25 DIAGNOSIS — M25511 Pain in right shoulder: Secondary | ICD-10-CM

## 2023-03-28 ENCOUNTER — Telehealth (INDEPENDENT_AMBULATORY_CARE_PROVIDER_SITE_OTHER): Payer: 59 | Admitting: Orthopaedic Surgery

## 2023-03-28 DIAGNOSIS — M75111 Incomplete rotator cuff tear or rupture of right shoulder, not specified as traumatic: Secondary | ICD-10-CM

## 2023-03-28 NOTE — Progress Notes (Signed)
 Post Operative Evaluation    MyChart video visit Patient location:  [address]  [place] [address]  My location: [address] [place], [place] [address]   Procedure/Date of Surgery: Right rotator cuff repair with collagen patch augmentation 12/5   Interval History:  . Presents 12 weeks status post the above procedure.  Overall she is continuing to improve.  Overhead range of motion is much improved.  She is still having some soreness at nighttime and after physical therapy although she is making slow and steady improvements  PMH/PSH/Family History/Social History/Meds/Allergies:    Past Medical History:  Diagnosis Date   Anemia    Asthma    well controlled   Colon polyps    Complication of anesthesia    DDD (degenerative disc disease), lumbar    Depression    GERD (gastroesophageal reflux disease)    occ   Iron deficiency anemia    Migraine    Morbid obesity (HCC)    Osteoarthritis    Osteoarthritis of right hip    PONV (postoperative nausea and vomiting)    with c-section x 1   Right rotator cuff tear 11/2022   Vitamin D deficiency    Past Surgical History:  Procedure Laterality Date   BREAST CYST ASPIRATION Left    CESAREAN SECTION  1990   CESAREAN SECTION  1993   CESAREAN SECTION  1996   COLONOSCOPY N/A 09/22/2020   Procedure: COLONOSCOPY;  Surgeon: Pasty Spillers, MD;  Location: [facility] END[facility]COPY;  Service: Endoscopy;  Laterality: N/A;   LAPAROSCOPY  2018   LEEP  2005   benign pathology; Dr. Luella Cook   SHOULDER ACROMIOPLASTY Right 01/03/2023   Procedure: RIGHT SHOULDER ACROMIOPLASTY;  Surgeon: Huel Cote, MD;  Location: [facility] ORS;  Service: Orthopedics;  Laterality: Right;   SHOULDER ARTHROSCOPY WITH ROTATOR CUFF REPAIR AND OPEN BICEPS TENODESIS Right 01/03/2023   Procedure: RIGHT SHOULDER ARTHROSCOPY WITH ROTATOR CUFF REPAIR AND  BICEPS TENODESIS;  Surgeon: Huel Cote, MD;  Location: [facility]  ORS;  Service: Orthopedics;  Laterality: Right;   SUPRACERVICAL ABDOMINAL HYSTERECTOMY  2010   leio/AUB; Dr. Luella Cook   TOTAL HIP ARTHROPLASTY Right 11/12/2018   Procedure: TOTAL HIP ARTHROPLASTY ANTERIOR APPROACH;  Surgeon: Lyndle Herrlich, MD;  Location: [facility] ORS;  Service: Orthopedics;  Laterality: Right;   Social History   Socioeconomic History   Marital status: Divorced    Spouse name: Not on file   Number of children: 3   Years of education: Not on file   Highest education level: Associate degree: academic program  Occupational History   Not on file  Tobacco Use   Smoking status: Never   Smokeless tobacco: Never  Vaping Use   Vaping status: Never Used  Substance and Sexual Activity   Alcohol use: Yes    Alcohol/week: 0.0 standard drinks of alcohol    Comment: occasionally   Drug use: No   Sexual activity: Yes    Birth control/protection: Surgical    Comment: Hysterectomy  Other Topics Concern   Not on file  Social History Narrative   Not on file   Social Drivers of Health   Financial Resource Strain: Medium Risk (03/26/2023)   Overall Financial Resource Strain (CARDIA)    Difficulty of Paying Living Expenses: Somewhat hard  Food  Insecurity: Patient Declined (03/26/2023)   Hunger Vital Sign    Worried About Running Out of Food in the Last Year: Patient declined    Ran Out of Food in the Last Year: Patient declined  Transportation Needs: No Transportation Needs (03/26/2023)   PRAPARE - Administrator, Civil Service (Medical): No    Lack of Transportation (Non-Medical): No  Physical Activity: Inactive (03/26/2023)   Exercise Vital Sign    Days of Exercise per Week: 0 days    Minutes of Exercise per Session: 90 min  Stress: Stress Concern Present (03/26/2023)   [employer] of Occupational Health - Occupational Stress Questionnaire    Feeling of Stress : To some extent  Social Connections: Socially Isolated (03/26/2023)   Social Connection and  Isolation Panel [NHANES]    Frequency of Communication with Friends and Family: More than three times a week    Frequency of Social Gatherings with Friends and Family: Three times a week    Attends Religious Services: Never    Active Member of Clubs or Organizations: No    Attends Engineer, structural: Not on file    Marital Status: Divorced   Family History  Problem Relation Age of Onset   Depression Mother    Bipolar disorder Brother    Colon cancer Maternal Grandfather        not sure of age   Breast cancer Neg Hx    Allergies  Allergen Reactions   Celecoxib Hives   Prednisone     intolerance, makes her angry. Makes her angry   Topamax [Topiramate]     Mood changes   Zolpidem     Mood changes   Codeine Rash   Penicillins Rash    Did it involve swelling of the face/tongue/throat, SOB, or low BP? No Did it involve sudden or severe rash/hives, skin peeling, or any reaction on the inside of your mouth or nose? Yes Did you need to seek medical attention at a hospital or doctor's office? Yes When did it last happen? More than 20 years ago       If all above answers are "NO", may proceed with cephalosporin use.    Current Outpatient Medications  Medication Sig Dispense Refill   acetaminophen (TYLENOL) 500 MG tablet Take 1,000 mg by mouth every 6 (six) hours as needed for moderate pain (pain score 4-6).     albuterol (PROVENTIL) (2.5 MG/3ML) 0.083% nebulizer solution Take 3 mLs (2.5 mg total) by nebulization every 6 (six) hours as needed for wheezing or shortness of breath 75 mL 1   Albuterol-Budesonide (AIRSUPRA) 90-80 MCG/ACT AERO Inhale 2 puffs into the lungs 4 (four) times daily as needed. 10.7 g 1   aspirin EC 325 MG tablet Take 1 tablet (325 mg total) by mouth daily. (Patient taking differently: Take 325 mg by mouth daily. For after the surgery) 14 tablet 0   desvenlafaxine (PRISTIQ) 50 MG 24 hr tablet Take 1 tablet (50 mg total) by mouth daily. 90 tablet 1    diclofenac Sodium (VOLTAREN) 1 % GEL Apply 2 grams topically 4 (four) times daily. 100 g 1   famotidine (PEPCID) 40 MG tablet Take 1 tablet (40 mg total) by mouth daily. 90 tablet 1   fluconazole (DIFLUCAN) 150 MG tablet Take 1 tablet (150 mg total) by mouth every other day. 3 tablet 0   Fluticasone-Umeclidin-Vilant (TRELEGY ELLIPTA) 200-62.5-25 MCG/ACT AEPB Inhale 1 puff into the lungs daily. (Patient taking differently: Inhale 1 puff  into the lungs at bedtime.) 60 each 6   gabapentin (NEURONTIN) 300 MG capsule Take 3 capsules (900 mg total) by mouth at bedtime. 270 capsule 1   Galcanezumab-gnlm (EMGALITY) 120 MG/ML SOAJ Inject 1 mL into the skin every 30 (thirty) days. 1 mL 5   lidocaine (LIDODERM) 5 % Place 2 patches onto the skin daily. Remove & Discard patch within 12 hours or as directed by MD 60 patch 2   oxyCODONE (OXY IR/ROXICODONE) 5 MG immediate release tablet Take 1 tablet (5 mg total) by mouth daily as needed for severe pain (pain score 7-10). Must last 30 days. 30 tablet 0   pantoprazole (PROTONIX) 40 MG tablet Take 1 tablet (40 mg total) by mouth daily. (Patient taking differently: Take 40 mg by mouth daily as needed.) 30 tablet 1   prazosin (MINIPRESS) 2 MG capsule Take 1 capsule (2 mg total) by mouth at bedtime. 90 capsule 0   traMADol (ULTRAM) 50 MG tablet Take 1 tablet (50 mg total) by mouth every 6 (six) hours as needed. 30 tablet 0   No current facility-administered medications for this visit.   No results found.  Review of Systems:   A ROS was performed including pertinent positives and negatives as documented in the HPI.   Musculoskeletal Exam:    There were no vitals taken for this visit.  Right shoulder incisions are well-appearing without erythema or drainage.  Passive range of motion as well as active range of motion is to 135 degrees in the standing position against gravity.  External rotation at side is 30 degrees 2+ radial pulse.  Distal neurosensory exam is  intact.  Sling is well-fitting after adjustment  Imaging:      I personally reviewed and interpreted the radiographs.   Assessment:   12-week status post right shoulder rotator cuff repair with collagen patch augmentation.  Overall doing well.  This time she is continuing to strengthen and improve with range of motion.  I will plan to see her back in 12 weeks for likely final check Plan :    -Return to clinic 12 weeks for reassessment      I personally saw and evaluated the patient, and participated in the management and treatment plan.  Huel Cote, MD Attending Physician, Orthopedic Surgery  This document was dictated using Dragon voice recognition software. A reasonable attempt at proof reading has been made to minimize errors.

## 2023-03-29 ENCOUNTER — Encounter: Payer: Self-pay | Admitting: Family Medicine

## 2023-03-29 ENCOUNTER — Other Ambulatory Visit: Payer: Self-pay

## 2023-03-29 ENCOUNTER — Ambulatory Visit: Payer: 59 | Admitting: Family Medicine

## 2023-03-29 DIAGNOSIS — E538 Deficiency of other specified B group vitamins: Secondary | ICD-10-CM

## 2023-03-29 DIAGNOSIS — G43009 Migraine without aura, not intractable, without status migrainosus: Secondary | ICD-10-CM | POA: Diagnosis not present

## 2023-03-29 DIAGNOSIS — E559 Vitamin D deficiency, unspecified: Secondary | ICD-10-CM | POA: Diagnosis not present

## 2023-03-29 DIAGNOSIS — J4550 Severe persistent asthma, uncomplicated: Secondary | ICD-10-CM | POA: Diagnosis not present

## 2023-03-29 DIAGNOSIS — Z79899 Other long term (current) drug therapy: Secondary | ICD-10-CM

## 2023-03-29 DIAGNOSIS — Z113 Encounter for screening for infections with a predominantly sexual mode of transmission: Secondary | ICD-10-CM

## 2023-03-29 DIAGNOSIS — K219 Gastro-esophageal reflux disease without esophagitis: Secondary | ICD-10-CM

## 2023-03-29 DIAGNOSIS — E785 Hyperlipidemia, unspecified: Secondary | ICD-10-CM | POA: Diagnosis not present

## 2023-03-29 DIAGNOSIS — D508 Other iron deficiency anemias: Secondary | ICD-10-CM | POA: Diagnosis not present

## 2023-03-29 DIAGNOSIS — F5104 Psychophysiologic insomnia: Secondary | ICD-10-CM

## 2023-03-29 DIAGNOSIS — E8881 Metabolic syndrome: Secondary | ICD-10-CM | POA: Diagnosis not present

## 2023-03-29 DIAGNOSIS — R6889 Other general symptoms and signs: Secondary | ICD-10-CM

## 2023-03-29 DIAGNOSIS — Z23 Encounter for immunization: Secondary | ICD-10-CM | POA: Diagnosis not present

## 2023-03-29 DIAGNOSIS — F39 Unspecified mood [affective] disorder: Secondary | ICD-10-CM

## 2023-03-29 DIAGNOSIS — Z6836 Body mass index (BMI) 36.0-36.9, adult: Secondary | ICD-10-CM

## 2023-03-29 MED ORDER — PRAZOSIN HCL 2 MG PO CAPS
2.0000 mg | ORAL_CAPSULE | Freq: Every day | ORAL | 0 refills | Status: DC
  Filled 2023-03-29 – 2023-06-27 (×2): qty 90, 90d supply, fill #0

## 2023-03-29 MED ORDER — CYANOCOBALAMIN 1000 MCG/ML IJ SOLN
1000.0000 ug | Freq: Once | INTRAMUSCULAR | Status: AC
  Administered 2023-03-29: 1000 ug via INTRAMUSCULAR

## 2023-03-29 NOTE — Progress Notes (Signed)
 Name: Shelby Henson   MRN: [id number]    DOB: [birth year]   Date:03/29/2023       Progress Note  Subjective  Chief Complaint  Chief Complaint  Patient presents with   Medical Management of Chronic Issues   HPI   GERD: she is doing well alternating PPI and H2 blocker and symptoms are controlled    Asthma Severe without complications: : currently on Trelegy and also uses Airsuppra prn and seems to be under better control. She states no cough or wheezing at this time. Under the care of pulmonologist     Metabolic Syndrome/Obesity : She denies polyphagia, polydipsia or polyuria. She used to be morbidly obese with BMI above 35 with co-morbidities , GERD, OA of right hip, metabolic syndrome , however lost weight on Ozempic . Weight used to be in the 170 lbs range, however in 2021 it spiked to 193 lbs, we started her on Ozempic lost down to 170 lbs but Covid and did not feel well and stopped medication, weight went up again to 180 lbs and we resumed medication in Dec 2022. We gave ger [facility] in April 23 and her weight was 177 lbs , she has been off medications for a while. Weight was up to 181 lbs fall 2024 but today is down to 178.9 % but not sure how it happened   Chronic right hip pain: s/p replacement but still on modified duty at work and has daily pain, no longer seeing pain clinic, but sees Dr. Odis Luster once a year.  Pain on her hip is described as burning, dull and aching, constant, still requires her to shift positions frequently to control symptoms. Seeing neurologist now , had MRI and has mild arthritis lumbar spine, and Dr. Sherryll Burger is ordering NCS and EMG it showed meralgia paresthetica . She is on higher dose of gabapentin also had three  injections done by Dr. Ashley Royalty but failed the treatment and she was going to [facility] for ablation but did not a find a provider that would be able to do the procedure in [place].  She is on gabapentin and still daily pain .Pain today is 6/10 , pain is constant   Insomnia:   Lunesta, Seroquel, Temazepam and  Trazodone made her moody. Melatonin did not work. She is able to fall asleep on Prazosin but unable to stay asleep, she does not want to change medications at this time    Mood swings :she has been on Pritiq for many years, she has a family history of bipolar She denies side effects, no anxiety symptoms,  phq 9 is normal today - She is good on current regiments    Low B12: she wants to have a B12 shot today    Migraine headache: she states symptoms usually intense , associated with nausea, photosensitivity, throbbing like and sometimes dull. Emgality is working for her but still has break though migraine the week before she is due for another shot, discussed changing to Nurtec but she does not want to take another pill.. Episodes are down to about once a month , happens right before her shots    Iron deficiency anemia: she cannot tolerate eating meat, hemoccult stools negative, colonoscopy up to date, she is taking iron pills . She still feels tired, we will recheck labs    Rotator cuff Tear right shoulder: she has a repair on Dec 5 th, 2024 , she is still getting PT and still has pain     Patient Active  Problem List   Diagnosis Date Noted   Severe persistent asthma without complication 03/29/2023   Nontraumatic incomplete tear of right rotator cuff 01/03/2023   Right rotator cuff tendonitis 09/11/2022   Iron deficiency anemia due to dietary causes 08/10/2022   Chronic right shoulder pain 05/11/2022   Morbid obesity (HCC) 01/05/2022   Rotator cuff impingement syndrome, right 09/29/2021   Biceps tendinitis, right 09/29/2021   Meralgia paresthetica of right side 03/29/2021   Psoas tendinitis, right hip 03/29/2021   History of total right hip arthroplasty 03/29/2021   Status post total replacement of right hip 02/19/2021   Meralgia paresthetica of right side 02/19/2021   Polyp of ascending colon 02/19/2021   Polyp of sigmoid colon 02/19/2021   Special  screening for malignant neoplasms, colon    Polyp of ascending colon    Polyp of sigmoid colon    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 03/23/2020   Pain in right hip 01/12/2019   Vitamin D deficiency 02/25/2018   Chronic pain 10/24/2015   Hot flashes 10/24/2015   Migraine without aura and without status migrainosus, not intractable 10/24/2015   Metabolic syndrome 10/24/2015   Dyslipidemia 10/24/2015   Allergic rhinitis, seasonal 05/17/2015   Chronic insomnia 08/08/2014   DDD (degenerative disc disease), lumbar 08/08/2014   Mood disorder (HCC) 08/08/2014   Gastroesophageal reflux disease without esophagitis 08/08/2014   H/O total hysterectomy 08/08/2014   Irritable bowel syndrome with diarrhea 08/08/2014   Osteoarthritis of right hip 08/08/2014   Adult BMI 30+ 08/08/2014   SBO (spina bifida occulta) 08/08/2014    Past Surgical History:  Procedure Laterality Date   BREAST CYST ASPIRATION Left    CESAREAN SECTION  1990   CESAREAN SECTION  1993   CESAREAN SECTION  1996   COLONOSCOPY N/A 09/22/2020   Procedure: COLONOSCOPY;  Surgeon: Pasty Spillers, MD;  Location: [facility] END[facility]COPY;  Service: Endoscopy;  Laterality: N/A;   LAPAROSCOPY  2018   LEEP  2005   benign pathology; Dr. Luella Cook   SHOULDER ACROMIOPLASTY Right 01/03/2023   Procedure: RIGHT SHOULDER ACROMIOPLASTY;  Surgeon: Huel Cote, MD;  Location: [facility] ORS;  Service: Orthopedics;  Laterality: Right;   SHOULDER ARTHROSCOPY WITH ROTATOR CUFF REPAIR AND OPEN BICEPS TENODESIS Right 01/03/2023   Procedure: RIGHT SHOULDER ARTHROSCOPY WITH ROTATOR CUFF REPAIR AND  BICEPS TENODESIS;  Surgeon: Huel Cote, MD;  Location: [facility] ORS;  Service: Orthopedics;  Laterality: Right;   SUPRACERVICAL ABDOMINAL HYSTERECTOMY  2010   leio/AUB; Dr. Luella Cook   TOTAL HIP ARTHROPLASTY Right 11/12/2018   Procedure: TOTAL HIP ARTHROPLASTY ANTERIOR APPROACH;  Surgeon: Lyndle Herrlich, MD;  Location: [facility] ORS;   Service: Orthopedics;  Laterality: Right;    Family History  Problem Relation Age of Onset   Depression Mother    Bipolar disorder Brother    Colon cancer Maternal Grandfather        not sure of age   Breast cancer Neg Hx     Social History   Tobacco Use   Smoking status: Never   Smokeless tobacco: Never  Substance Use Topics   Alcohol use: Yes    Alcohol/week: 0.0 standard drinks of alcohol    Comment: occasionally     Current Outpatient Medications:    albuterol (PROVENTIL) (2.5 MG/3ML) 0.083% nebulizer solution, Take 3 mLs (2.5 mg total) by nebulization every 6 (six) hours as needed for wheezing or shortness of breath, Disp: 75 mL, Rfl: 1   Albuterol-Budesonide (AIRSUPRA) 90-80 MCG/ACT AERO,  Inhale 2 puffs into the lungs 4 (four) times daily as needed., Disp: 10.7 g, Rfl: 1   desvenlafaxine (PRISTIQ) 50 MG 24 hr tablet, Take 1 tablet (50 mg total) by mouth daily., Disp: 90 tablet, Rfl: 1   diclofenac Sodium (VOLTAREN) 1 % GEL, Apply 2 grams topically 4 (four) times daily., Disp: 100 g, Rfl: 1   famotidine (PEPCID) 40 MG tablet, Take 1 tablet (40 mg total) by mouth daily., Disp: 90 tablet, Rfl: 1   Fluticasone-Umeclidin-Vilant (TRELEGY ELLIPTA) 200-62.5-25 MCG/ACT AEPB, Inhale 1 puff into the lungs daily. (Patient taking differently: Inhale 1 puff into the lungs at bedtime.), Disp: 60 each, Rfl: 6   gabapentin (NEURONTIN) 300 MG capsule, Take 3 capsules (900 mg total) by mouth at bedtime., Disp: 270 capsule, Rfl: 1   Galcanezumab-gnlm (EMGALITY) 120 MG/ML SOAJ, Inject 1 mL into the skin every 30 (thirty) days., Disp: 1 mL, Rfl: 5   lidocaine (LIDODERM) 5 %, Place 2 patches onto the skin daily. Remove & Discard patch within 12 hours or as directed by MD, Disp: 60 patch, Rfl: 2   oxyCODONE (OXY IR/ROXICODONE) 5 MG immediate release tablet, Take 1 tablet (5 mg total) by mouth daily as needed for severe pain (pain score 7-10). Must last 30 days., Disp: 30 tablet, Rfl: 0    pantoprazole (PROTONIX) 40 MG tablet, Take 1 tablet (40 mg total) by mouth daily. (Patient taking differently: Take 40 mg by mouth daily as needed.), Disp: 30 tablet, Rfl: 1   traMADol (ULTRAM) 50 MG tablet, Take 1 tablet (50 mg total) by mouth every 6 (six) hours as needed., Disp: 30 tablet, Rfl: 0   prazosin (MINIPRESS) 2 MG capsule, Take 1 capsule (2 mg total) by mouth at bedtime., Disp: 90 capsule, Rfl: 0  Allergies  Allergen Reactions   Celecoxib Hives   Prednisone     intolerance, makes her angry. Makes her angry   Topamax [Topiramate]     Mood changes   Zolpidem     Mood changes   Codeine Rash   Penicillins Rash    Did it involve swelling of the face/tongue/throat, SOB, or low BP? No Did it involve sudden or severe rash/hives, skin peeling, or any reaction on the inside of your mouth or nose? Yes Did you need to seek medical attention at a hospital or doctor's office? Yes When did it last happen? More than 20 years ago       If all above answers are "NO", may proceed with cephalosporin use.     I personally reviewed active problem list, medication list, allergies, family history with the patient/caregiver today.   ROS  Ten systems reviewed and is negative except as mentioned in HPI    Objective  Vitals:   03/29/23 0937  BP: 136/82  Pulse: 78  Resp: 16  SpO2: 96%  Weight: 178 lb 14.4 oz (81.1 kg)  Height: 4\' 11"  (1.499 m)    Body mass index is 36.13 kg/m.  Physical Exam  Constitutional: Patient appears well-developed and well-nourished. Obese  No distress.  HEENT: head atraumatic, normocephalic, pupils equal and reactive to light, neck supple Cardiovascular: Normal rate, regular rhythm and normal heart sounds.  No murmur heard. No BLE edema. Pulmonary/Chest: Effort normal and breath sounds normal. No respiratory distress. Abdominal: Soft.  There is no tenderness. Psychiatric: Patient has a normal mood and affect. behavior is normal. Judgment and thought  content normal.    Diabetic Foot Exam:     PHQ2/9:  03/29/2023    9:36 AM 03/05/2023    2:03 PM 12/14/2022    9:50 AM 08/10/2022   11:01 AM 05/11/2022   11:16 AM  Depression screen PHQ 2/9  Decreased Interest 0 0 0 0 0  Down, Depressed, Hopeless 0 0 0 0 0  PHQ - 2 Score 0 0 0 0 0  Altered sleeping 0  0 3 3  Tired, decreased energy 0  0 3 3  Change in appetite 0  0 0 1  Feeling bad or failure about yourself  0  0 0 0  Trouble concentrating 0  0 1 2  Moving slowly or fidgety/restless 0  0 0 0  Suicidal thoughts 0  0 0 0  PHQ-9 Score 0  0 7 9  Difficult doing work/chores Not difficult at all   Not difficult at all Somewhat difficult    phq 9 is negative  Fall Risk:    03/05/2023    2:03 PM 12/14/2022    9:50 AM 09/11/2022    9:00 AM 08/10/2022   11:01 AM 07/02/2022   11:28 AM  Fall Risk   Falls in the past year? 0 1 1 0 0  Number falls in past yr:  0 0    Injury with Fall?  1 1    Comment   bruising on right side, right shoulder pain    Risk for fall due to :  History of fall(s);Orthopedic patient  No Fall Risks No Fall Risks  Follow up  Falls prevention discussed  Falls prevention discussed Falls prevention discussed     Assessment & Plan   1. Morbid obesity (HCC) (Primary)  Discussed with the patient the risk posed by an increased BMI. Discussed importance of portion control, calorie counting and at least 150 minutes of physical activity weekly. Avoid sweet beverages and drink more water. Eat at least 6 servings of fruit and vegetables daily    2. Mood disorder (HCC)  Doing well on Pristiq  3. Iron deficiency anemia due to dietary causes  - CBC with Differential/Platelet - Iron, TIBC and Ferritin Panel  4. Chronic insomnia  - prazosin (MINIPRESS) 2 MG capsule; Take 1 capsule (2 mg total) by mouth at bedtime.  Dispense: 90 capsule; Refill: 0  5. Severe persistent asthma without complication  Keep visits with pulmonologist   6. Migraine without aura and  without status migrainosus, not intractable  Doing well   7. Immunization due  - Pneumococcal conjugate vaccine 20-valent  8. Metabolic syndrome  - Hemoglobin A1c  9. Gastroesophageal reflux disease without esophagitis  Continue supplementation   10. B12 deficiency  - B12 and Folate Panel - CBC with Differential/Platelet  11. Vitamin D deficiency  - VITAMIN D 25 Hydroxy (Vit-D Deficiency, Fractures)  12. Dyslipidemia  - Lipid panel - COMPLETE METABOLIC PANEL WITH GFR  13. Cold intolerance  - TSH  14. Long-term use of high-risk medication  - COMPLETE METABOLIC PANEL WITH GFR  15. Screening examination for STI  - HIV Antibody (routine testing w rflx) - RPR

## 2023-03-30 LAB — TSH: TSH: 1.22 m[IU]/L

## 2023-03-30 LAB — HEMOGLOBIN A1C
Hgb A1c MFr Bld: 5.7 %{Hb} — ABNORMAL HIGH (ref <5.7)
Mean Plasma Glucose: 117 mg/dL
eAG (mmol/L): 6.5 mmol/L

## 2023-03-30 LAB — COMPLETE METABOLIC PANEL WITH GFR
AG Ratio: 1.5 (calc) (ref 1.0–2.5)
ALT: 51 U/L — ABNORMAL HIGH (ref 6–29)
AST: 38 U/L — ABNORMAL HIGH (ref 10–35)
Albumin: 4.4 g/dL (ref 3.6–5.1)
Alkaline phosphatase (APISO): 166 U/L — ABNORMAL HIGH (ref 37–153)
BUN: 17 mg/dL (ref 7–25)
CO2: 28 mmol/L (ref 20–32)
Calcium: 9.5 mg/dL (ref 8.6–10.4)
Chloride: 107 mmol/L (ref 98–110)
Creat: 0.53 mg/dL (ref 0.50–1.03)
Globulin: 2.9 g/dL (ref 1.9–3.7)
Glucose, Bld: 79 mg/dL (ref 65–99)
Potassium: 4.1 mmol/L (ref 3.5–5.3)
Sodium: 143 mmol/L (ref 135–146)
Total Bilirubin: 0.3 mg/dL (ref 0.2–1.2)
Total Protein: 7.3 g/dL (ref 6.1–8.1)
eGFR: 111 mL/min/{1.73_m2} (ref >=60)

## 2023-03-30 LAB — B12 AND FOLATE PANEL
Folate: 6.7 ng/mL
Vitamin B-12: 403 pg/mL (ref 200–1100)

## 2023-03-30 LAB — LIPID PANEL
Cholesterol: 207 mg/dL — ABNORMAL HIGH (ref <200)
HDL: 59 mg/dL (ref >=50)
LDL Cholesterol (Calc): 127 mg/dL — ABNORMAL HIGH
Non-HDL Cholesterol (Calc): 148 mg/dL — ABNORMAL HIGH (ref <130)
Total CHOL/HDL Ratio: 3.5 (calc) (ref <5.0)
Triglycerides: 99 mg/dL (ref <150)

## 2023-03-30 LAB — CBC WITH DIFFERENTIAL/PLATELET
Absolute Lymphocytes: 2192 {cells}/uL (ref 850–3900)
Absolute Monocytes: 613 {cells}/uL (ref 200–950)
Basophils Absolute: 59 {cells}/uL (ref 0–200)
Basophils Relative: 0.7 %
Eosinophils Absolute: 1268 {cells}/uL — ABNORMAL HIGH (ref 15–500)
Eosinophils Relative: 15.1 %
HCT: 34 % — ABNORMAL LOW (ref 35.0–45.0)
Hemoglobin: 11.1 g/dL — ABNORMAL LOW (ref 11.7–15.5)
MCH: 27.5 pg (ref 27.0–33.0)
MCHC: 32.6 g/dL (ref 32.0–36.0)
MCV: 84.4 fL (ref 80.0–100.0)
MPV: 12 fL (ref 7.5–12.5)
Monocytes Relative: 7.3 %
Neutro Abs: 4267 {cells}/uL (ref 1500–7800)
Neutrophils Relative %: 50.8 %
Platelets: 252 10*3/uL (ref 140–400)
RBC: 4.03 10*6/uL (ref 3.80–5.10)
RDW: 15 % (ref 11.0–15.0)
Total Lymphocyte: 26.1 %
WBC: 8.4 10*3/uL (ref 3.8–10.8)

## 2023-03-30 LAB — IRON,TIBC AND FERRITIN PANEL
%SAT: 11 % — ABNORMAL LOW (ref 16–45)
Ferritin: 33 ng/mL (ref 16–232)
Iron: 38 ug/dL — ABNORMAL LOW (ref 45–160)
TIBC: 351 ug/dL (ref 250–450)

## 2023-03-30 LAB — RPR: RPR Ser Ql: NONREACTIVE

## 2023-03-30 LAB — VITAMIN D 25 HYDROXY (VIT D DEFICIENCY, FRACTURES): Vit D, 25-Hydroxy: 26 ng/mL — ABNORMAL LOW (ref 30–100)

## 2023-03-30 LAB — HIV ANTIBODY (ROUTINE TESTING W REFLEX): HIV 1&2 Ab, 4th Generation: NONREACTIVE

## 2023-04-01 ENCOUNTER — Encounter: Payer: Self-pay | Admitting: Family Medicine

## 2023-04-01 ENCOUNTER — Other Ambulatory Visit: Payer: Self-pay | Admitting: Family Medicine

## 2023-04-01 DIAGNOSIS — R7989 Other specified abnormal findings of blood chemistry: Secondary | ICD-10-CM

## 2023-04-02 ENCOUNTER — Ambulatory Visit: Attending: Orthopaedic Surgery

## 2023-04-02 DIAGNOSIS — M25611 Stiffness of right shoulder, not elsewhere classified: Secondary | ICD-10-CM | POA: Insufficient documentation

## 2023-04-02 DIAGNOSIS — M25619 Stiffness of unspecified shoulder, not elsewhere classified: Secondary | ICD-10-CM | POA: Insufficient documentation

## 2023-04-02 DIAGNOSIS — M6281 Muscle weakness (generalized): Secondary | ICD-10-CM | POA: Diagnosis not present

## 2023-04-02 DIAGNOSIS — M25511 Pain in right shoulder: Secondary | ICD-10-CM | POA: Insufficient documentation

## 2023-04-02 NOTE — Therapy (Signed)
 OUTPATIENT PHYSICAL THERAPY SHOULDER TREATMENT/ Recert     Patient Name: Shelby Henson MRN: [id number] DOB:10-Feb-1970, 53 y.o., female Today's Date: 04/02/2023  END OF SESSION:  PT End of Session - 04/02/23 1012     Visit Number 16    Number of Visits 24    Date for PT Re-Evaluation 05/28/23    Authorization Type [facility] [employer]    Authorization Time Period 01/07/23-04/07/23    Progress Note Due on Visit 10    PT Start Time 1015    PT Stop Time 1059    PT Time Calculation (min) 44 min    Activity Tolerance Patient tolerated treatment well    Behavior During Therapy WFL for tasks assessed/performed                          Past Medical History:  Diagnosis Date   Anemia    Asthma    well controlled   Colon polyps    Complication of anesthesia    DDD (degenerative disc disease), lumbar    Depression    GERD (gastroesophageal reflux disease)    occ   Iron deficiency anemia    Migraine    Morbid obesity (HCC)    Osteoarthritis    Osteoarthritis of right hip    PONV (postoperative nausea and vomiting)    with c-section x 1   Right rotator cuff tear 11/2022   Vitamin D deficiency    Past Surgical History:  Procedure Laterality Date   BREAST CYST ASPIRATION Left    CESAREAN SECTION  1990   CESAREAN SECTION  1993   CESAREAN SECTION  1996   COLONOSCOPY N/A 09/22/2020   Procedure: COLONOSCOPY;  Surgeon: Pasty Spillers, MD;  Location: [facility];  Service: Endoscopy;  Laterality: N/A;   LAPAROSCOPY  2018   LEEP  2005   benign pathology; Dr. Luella Cook   SHOULDER ACROMIOPLASTY Right 01/03/2023   Procedure: RIGHT SHOULDER ACROMIOPLASTY;  Surgeon: Huel Cote, MD;  Location: [facility];  Service: Orthopedics;  Laterality: Right;   SHOULDER ARTHROSCOPY WITH ROTATOR CUFF REPAIR AND OPEN BICEPS TENODESIS Right 01/03/2023   Procedure: RIGHT SHOULDER ARTHROSCOPY WITH ROTATOR CUFF REPAIR AND  BICEPS TENODESIS;  Surgeon: Huel Cote, MD;  Location:  [facility];  Service: Orthopedics;  Laterality: Right;   SUPRACERVICAL ABDOMINAL HYSTERECTOMY  2010   leio/AUB; Dr. Luella Cook   TOTAL HIP ARTHROPLASTY Right 11/12/2018   Procedure: TOTAL HIP ARTHROPLASTY ANTERIOR APPROACH;  Surgeon: Lyndle Herrlich, MD;  Location: [facility];  Service: Orthopedics;  Laterality: Right;   Patient Active Problem List   Diagnosis Date Noted   Severe persistent asthma without complication 03/29/2023   Nontraumatic incomplete tear of right rotator cuff 01/03/2023   Right rotator cuff tendonitis 09/11/2022   Iron deficiency anemia due to dietary causes 08/10/2022   Chronic right shoulder pain 05/11/2022   Morbid obesity (HCC) 01/05/2022   Rotator cuff impingement syndrome, right 09/29/2021   Biceps tendinitis, right 09/29/2021   Meralgia paresthetica of right side 03/29/2021   Psoas tendinitis, right hip 03/29/2021   History of total right hip arthroplasty 03/29/2021   Status post total replacement of right hip 02/19/2021   Meralgia paresthetica of right side 02/19/2021   Polyp of ascending colon 02/19/2021   Polyp of sigmoid colon 02/19/2021   Special screening for malignant neoplasms, colon    Polyp of ascending colon    Polyp of sigmoid colon    COVID-19 long  hauler manifesting chronic loss of smell and taste 03/23/2020   Pain in right hip 01/12/2019   Vitamin D deficiency 02/25/2018   Chronic pain 10/24/2015   Hot flashes 10/24/2015   Migraine without aura and without status migrainosus, not intractable 10/24/2015   Metabolic syndrome 10/24/2015   Dyslipidemia 10/24/2015   Allergic rhinitis, seasonal 05/17/2015   Chronic insomnia 08/08/2014   DDD (degenerative disc disease), lumbar 08/08/2014   Mood disorder (HCC) 08/08/2014   Gastroesophageal reflux disease without esophagitis 08/08/2014   H/O total hysterectomy 08/08/2014   Irritable bowel syndrome with diarrhea 08/08/2014   Osteoarthritis of right hip 08/08/2014   Adult BMI 30+ 08/08/2014   SBO  (spina bifida occulta) 08/08/2014    PCP: Alba Cory, MD   REFERRING PROVIDER: Huel Cote, MD orthopedics   REFERRING DIAG: Rt arthroscopic rotator cuff repair   THERAPY DIAG:  Right shoulder pain, unspecified chronicity  Stiffness of right shoulder, not elsewhere classified  Muscle weakness (generalized)  Limited range of motion (ROM) of shoulder  Rationale for Evaluation and Treatment: Rehabilitation  ONSET DATE: 01/03/23  SUBJECTIVE:                                                                                                                                                                                      SUBJECTIVE STATEMENT: Patient worked out legs at gym yesterday, irritated shoulder.   Hand dominance: Right  PERTINENT HISTORY: Shelby Henson is a 53yo F presents to outpatient physical therapy on postop day 4 status post right arthroscopic shoulder debridement of supraspinatus tendon, anterior glenoid labrum, subacromial depression, rotator cuff repair with Dr. Huel Cote.  Patient reports more than 1 year of insidious onset shoulder pain previously followed by sports medicine Dr. Ashley Royalty.  PAIN:  Are you having pain? 3/10 right shoulder: Worst pain was 8 out of 10 while sleeping overnight 2 nights ago.  PRECAUTIONS: Patient in sling immobilizer and nonweightbearing x 2 weeks.  Awaiting updates pending follow-up visit with orthopedics.   WEIGHT BEARING RESTRICTIONS: Yes, right upper extremity nonweightbearing  FALLS:  Has patient fallen in last 6 months?  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 PATIENT SURVEYS:  FOTO:  4  SENSATION: WNL  POSTURE: WNL  UPPER EXTREMITY ROM:    ROM Right eval Left eval  Shoulder flexion 90   Shoulder extension NT   Shoulder abduction NT   Shoulder internal rotation full  Shoulder external rotation NT   Elbow flexion full   Elbow extension full    Wrist flexion full    Wrist extension full    Wrist ulnar deviation full    Wrist radial deviation full    forearm pronation full    forearm supination full    (Blank rows = not tested)    PALPATION:  Tenderness at the posterior shoulder portal wound while adjusting/donning sling   *PROTOCOL LOCATED IN MEDIA SECTION OF EPIC   TODAY'S TREATMENT:                                                                                                                                         DATE: 04/02/23 Physical therapy treatment session today consisted of completing assessment of goals and administration of testing as demonstrated and documented in flow sheet, treatment, and goals section of this note. Addition treatments may be found below.    Manual therapy: (supine)  Gentle grade 1-2 GH joint mobs (inf/PA/AP) x several min Bicep lengthening stretch with pressure x multiple reps Scapular depression and retraction x4 minutes   TherEx: Gentle PROM right shoulder flex, abduction, ER, IR 10x  Gentle AROM:  flexion, abduction, IR, ER supine position 10x each direction   Finger walk:  -flexion 5x -abduction 5x      PATIENT EDUCATION: Education details: See above- extensive education this visit  Person educated: patient  Education method: Research scientist (medical), deliberate practice, positive reinforcement, explicit instruction, establish rules,  Education comprehension: adequate   HOME EXERCISE PROGRAM: Access Code: [email or web address] URL: [email or web address] Date: 01/07/2023 Prepared by: Alvera Novel  Exercises - Seated Cervical Sidebending Stretch  - 1-2 x daily - 3 sets - 30 hold - Seated  Assisted Cervical Rotation with Towel  - 1-2 x daily - 3 sets - 30 hold - Sidelying Thoracic Rotation Book Openings- Both sides  - 1 x daily - 1-2 x weekly - 1 sets - 20 reps - 1-2sec hold - Supine Cervical Retraction with Towel  - 2 x daily - 7 x weekly - 1 sets - 15 reps - 3-5sec hold - Shoulder external rotation, scapular retraction RED band  - 1 x daily - 3 x weekly - 3 sets - 15 reps - Standing Bilateral Low Shoulder Row with Anchored Resistance  - 1 x daily - 7 x weekly - 3 sets - 15 reps  Access Code: [email or web address] URL: [email or web address] Date: 02/04/2023 Prepared by: Precious Bard  Exercises - Supine Shoulder Flexion AAROM with Dowel  - 1 x daily - 7 x weekly - 2 sets - 10 reps - 5 hold - Supine Shoulder Abduction AAROM with Dowel  - 1 x daily - 7 x weekly - 2 sets - 10 reps - 5 hold - Supine Shoulder External Rotation with Dowel  - 1 x daily - 7 x weekly - 2 sets - 10 reps - 5  hold - Seated Scapular Retraction  - 1 x daily - 7 x weekly - 2 sets - 10 reps - 5 hold  ASSESSMENT:  CLINICAL IMPRESSION: Patient is progressing with functional ROM and strength. She is limited by protocol and pain at this time but has a good prognosis. She is highly motivated to return to PLOF. ROM is near full with AROM in supine position and progressing in seated gravity position. Her strength is improving with decreased fatigue with AROM.  Patient will benefit from skilled PT services to fully rehabilitate this operative shoulder to allow for full return to PLOF in UE use for ADL, IADL, and fitness/leisure.   OBJECTIVE IMPAIRMENTS: decreased activity tolerance, decreased endurance, decreased knowledge of condition, decreased ROM, decreased strength, increased muscle spasms, impaired flexibility, and improper body mechanics.   ACTIVITY LIMITATIONS: carrying, lifting, bathing, toileting, dressing, self feeding, reach over head, and hygiene/grooming  PARTICIPATION LIMITATIONS: meal prep,  cleaning, laundry, driving, shopping, community activity, occupation, and yard work  PERSONAL FACTORS: Age, Behavior pattern, Education, Fitness, Past/current experiences, Profession, Social background, Time since onset of injury/illness/exacerbation, and Transportation are also affecting patient's functional outcome.   REHAB POTENTIAL: Excellent  CLINICAL DECISION MAKING: Stable/uncomplicated  EVALUATION COMPLEXITY: Low   GOALS: Goals reviewed with patient? No  SHORT TERM GOALS: Target date: 04/30/2023    Pt to demonstrate Rt shoulder P/ROM flexion to >120 degrees, ABDCT >90 degrees, ER >45 degrees to improve ability to perform ADL/selfcare.  Baseline: 1/22: flexion: 70, abduction 38, scaption 71 3/4: MET Goal status:MET  2.  Pt to report successful HEP performance and successful adherence to postoperative restrictions.  Baseline: HEP compliant Goal status:MET  3.  Pt to demonstrate 3/5 Rt shoulder MMT to 90 degrees of flexion and 90 degrees of abduction.  Baseline: unable 1/22: not able to due to protocol Goal status: in progress  LONG TERM GOALS: Target date: 05/28/2023    Pt to improve FOTO survey goal by >60 points to indicate a monstrous reduction in difficulty of Rt limb use in a variety of questionable activities.  Baseline: 4 1/22: 44 Goal status: Deferred   2.  Pt to demonstrate 5/5 Rt shoulder flexion, abduction, ER, IR, extension; 5/5 Rt elbow flexion/extension  Baseline: 1/22: deferred due to protocol 3/4: 3/5 strength  Goal status:ongoing   3.  Pt to demonstrate >155 degrees Rt shoulder A/ROM without end-range pain>2/10.  Baseline: 1/22: flexion: 70, abduction 38, scaption 71 3/4: flexion: 115 abduction: 92 scaption:  120 Goal status: Partially Met  4.  Pt to demonstrate ability to perform gym-type exercises of resistance and aerobic natures without pain exacerbation in shoulder to allow for return to regular exercise at DC.  Baseline: 1/22: not released from  protocol yet 3/4: no yet to this point of protocol  Goal status: ongoing   5   Patient will decrease Quick DASH score by > 8 points demonstrating reduced self-reported upper extremity disability. Baseline:  Goal status: NEW  PLAN:  PT FREQUENCY: 1-2x/week  PT DURATION: 8 weeks   PLANNED INTERVENTIONS: 97110-Therapeutic exercises, 97530- Therapeutic activity, [id number]- Neuromuscular re-education, 97535- Self Care, [id number]- Manual therapy, [id number]- Electrical stimulation (unattended), [id number]- Electrical stimulation (manual), Patient/Family education, and Moist heat  PLAN FOR NEXT SESSION: continue per Shoulder protocol - week 4  10:59 AM, 04/02/23     Precious Bard, PT 04/02/2023, 10:59 AM

## 2023-04-03 ENCOUNTER — Other Ambulatory Visit: Payer: Self-pay

## 2023-04-03 ENCOUNTER — Other Ambulatory Visit: Payer: Self-pay | Admitting: Family Medicine

## 2023-04-03 MED ORDER — NITROFURANTOIN MONOHYD MACRO 100 MG PO CAPS
100.0000 mg | ORAL_CAPSULE | Freq: Two times a day (BID) | ORAL | 0 refills | Status: DC
  Filled 2023-04-03: qty 10, 5d supply, fill #0

## 2023-04-04 ENCOUNTER — Ambulatory Visit: Payer: 59

## 2023-04-04 ENCOUNTER — Other Ambulatory Visit: Payer: Self-pay

## 2023-04-04 ENCOUNTER — Other Ambulatory Visit: Payer: Self-pay | Admitting: Family Medicine

## 2023-04-04 DIAGNOSIS — K219 Gastro-esophageal reflux disease without esophagitis: Secondary | ICD-10-CM

## 2023-04-04 DIAGNOSIS — F39 Unspecified mood [affective] disorder: Secondary | ICD-10-CM

## 2023-04-04 DIAGNOSIS — R232 Flushing: Secondary | ICD-10-CM

## 2023-04-04 MED ORDER — DESVENLAFAXINE SUCCINATE ER 50 MG PO TB24
50.0000 mg | ORAL_TABLET | Freq: Every day | ORAL | 1 refills | Status: DC
Start: 2023-04-04 — End: 2023-10-23
  Filled 2023-04-04: qty 90, 90d supply, fill #0
  Filled 2023-07-08: qty 90, 90d supply, fill #1

## 2023-04-04 MED ORDER — FAMOTIDINE 40 MG PO TABS
40.0000 mg | ORAL_TABLET | Freq: Every day | ORAL | 1 refills | Status: DC
  Filled 2023-04-04: qty 90, 90d supply, fill #0
  Filled 2023-07-08: qty 90, 90d supply, fill #1

## 2023-04-04 NOTE — Telephone Encounter (Signed)
 Last f/u 03/2023

## 2023-04-08 ENCOUNTER — Other Ambulatory Visit: Payer: Self-pay | Admitting: Family Medicine

## 2023-04-08 ENCOUNTER — Other Ambulatory Visit: Payer: Self-pay

## 2023-04-08 MED ORDER — FLUCONAZOLE 150 MG PO TABS
150.0000 mg | ORAL_TABLET | ORAL | 0 refills | Status: DC
  Filled 2023-04-08: qty 2, 4d supply, fill #0

## 2023-04-08 NOTE — Therapy (Signed)
 OUTPATIENT PHYSICAL THERAPY SHOULDER TREATMENT     Patient Name: Shelby Henson MRN: [id number] DOB:03/26/1970, 53 y.o., female Today's Date: 04/09/2023  END OF SESSION:  PT End of Session - 04/09/23 0715     Visit Number 17    Number of Visits 24    Date for PT Re-Evaluation 05/28/23    Authorization Type [facility] [employer] F[employer]    Authorization Time Period 01/07/23-04/07/23    Progress Note Due on Visit 10    PT Start Time 0715    PT Stop Time 0758    PT Time Calculation (min) 43 min    Activity Tolerance Patient tolerated treatment well    Behavior During Therapy [facility] for tasks assessed/performed                           Past Medical History:  Diagnosis Date   Anemia    Asthma    well controlled   Colon polyps    Complication of anesthesia    DDD (degenerative disc disease), lumbar    Depression    GERD (gastroesophageal reflux disease)    occ   Iron deficiency anemia    Migraine    Morbid obesity (HCC)    Osteoarthritis    Osteoarthritis of right hip    PONV (postoperative nausea and vomiting)    with c-section x 1   Right rotator cuff tear 11/2022   Vitamin D deficiency    Past Surgical History:  Procedure Laterality Date   BREAST CYST ASPIRATION Left    CESAREAN SECTION  1990   CESAREAN SECTION  1993   CESAREAN SECTION  1996   COLONOSCOPY N/A 09/22/2020   Procedure: COLONOSCOPY;  Surgeon: Pasty Spillers, MD;  Location: [facility] ENDOSCOPY;  Service: Endoscopy;  Laterality: N/A;   LAPAROSCOPY  2018   LEEP  2005   benign pathology; Dr. Luella Cook   SHOULDER ACROMIOPLASTY Right 01/03/2023   Procedure: RIGHT SHOULDER ACROMIOPLASTY;  Surgeon: Huel Cote, MD;  Location: [facility] ORS;  Service: Orthopedics;  Laterality: Right;   SHOULDER ARTHROSCOPY WITH ROTATOR CUFF REPAIR AND OPEN BICEPS TENODESIS Right 01/03/2023   Procedure: RIGHT SHOULDER ARTHROSCOPY WITH ROTATOR CUFF REPAIR AND  BICEPS TENODESIS;  Surgeon: Huel Cote, MD;  Location: [facility]  ORS;  Service: Orthopedics;  Laterality: Right;   SUPRACERVICAL ABDOMINAL HYSTERECTOMY  2010   leio/AUB; Dr. Luella Cook   TOTAL HIP ARTHROPLASTY Right 11/12/2018   Procedure: TOTAL HIP ARTHROPLASTY ANTERIOR APPROACH;  Surgeon: Lyndle Herrlich, MD;  Location: [facility] ORS;  Service: Orthopedics;  Laterality: Right;   Patient Active Problem List   Diagnosis Date Noted   Severe persistent asthma without complication 03/29/2023   Nontraumatic incomplete tear of right rotator cuff 01/03/2023   Right rotator cuff tendonitis 09/11/2022   Iron deficiency anemia due to dietary causes 08/10/2022   Chronic right shoulder pain 05/11/2022   Morbid obesity (HCC) 01/05/2022   Rotator cuff impingement syndrome, right 09/29/2021   Biceps tendinitis, right 09/29/2021   Meralgia paresthetica of right side 03/29/2021   Psoas tendinitis, right hip 03/29/2021   History of total right hip arthroplasty 03/29/2021   Status post total replacement of right hip 02/19/2021   Meralgia paresthetica of right side 02/19/2021   Polyp of ascending colon 02/19/2021   Polyp of sigmoid colon 02/19/2021   Special screening for malignant neoplasms, colon    Polyp of ascending colon    Polyp of sigmoid colon    COVID-19 long  hauler manifesting chronic loss of smell and taste 03/23/2020   Pain in right hip 01/12/2019   Vitamin D deficiency 02/25/2018   Chronic pain 10/24/2015   Hot flashes 10/24/2015   Migraine without aura and without status migrainosus, not intractable 10/24/2015   Metabolic syndrome 10/24/2015   Dyslipidemia 10/24/2015   Allergic rhinitis, seasonal 05/17/2015   Chronic insomnia 08/08/2014   DDD (degenerative disc disease), lumbar 08/08/2014   Mood disorder (HCC) 08/08/2014   Gastroesophageal reflux disease without esophagitis 08/08/2014   H/O total hysterectomy 08/08/2014   Irritable bowel syndrome with diarrhea 08/08/2014   Osteoarthritis of right hip 08/08/2014   Adult BMI 30+ 08/08/2014   SBO (spina  bifida occulta) 08/08/2014    PCP: Alba Cory, MD   REFERRING PROVIDER: Huel Cote, MD orthopedics   REFERRING DIAG: Rt arthroscopic rotator cuff repair   THERAPY DIAG:  Right shoulder pain, unspecified chronicity  Stiffness of right shoulder, not elsewhere classified  Muscle weakness (generalized)  Limited range of motion (ROM) of shoulder  Rationale for Evaluation and Treatment: Rehabilitation  ONSET DATE: 01/03/23  SUBJECTIVE:                                                                                                                                                                                      SUBJECTIVE STATEMENT: Patient reports feeling catching in her shoulder now. Having a lot of aching in the joint.   Hand dominance: Right  PERTINENT HISTORY: Shelby Henson is a 53yo F presents to outpatient physical therapy on postop day 4 status post right arthroscopic shoulder debridement of supraspinatus tendon, anterior glenoid labrum, subacromial depression, rotator cuff repair with Dr. Huel Cote.  Patient reports more than 1 year of insidious onset shoulder pain previously followed by sports medicine Dr. Ashley Royalty.  PAIN:  Are you having pain? 3/10 right shoulder: Worst pain was 8 out of 10 while sleeping overnight 2 nights ago.  PRECAUTIONS: Patient in sling immobilizer and nonweightbearing x 2 weeks.  Awaiting updates pending follow-up visit with orthopedics.   WEIGHT BEARING RESTRICTIONS: Yes, right upper extremity nonweightbearing  FALLS:  Has patient fallen in last 6 months?  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 PATIENT SURVEYS:  FOTO:  4  SENSATION: WNL  POSTURE: WNL  UPPER EXTREMITY ROM:    ROM Right eval Left eval  Shoulder flexion 90   Shoulder extension NT   Shoulder abduction  NT   Shoulder internal rotation full   Shoulder external rotation NT   Elbow flexion full   Elbow extension full    Wrist flexion full    Wrist extension full    Wrist ulnar deviation full    Wrist radial deviation full    forearm pronation full    forearm supination full    (Blank rows = not tested)    PALPATION:  Tenderness at the posterior shoulder portal wound while adjusting/donning sling   *PROTOCOL LOCATED IN MEDIA SECTION OF EPIC   TODAY'S TREATMENT:                                                                                                                                         DATE: 04/09/23   Manual therapy: (supine)  Gentle grade 1-2 GH joint mobs (inf/PA/AP) x several min Bicep lengthening stretch with pressure x multiple reps Scapular depression and retraction x4 minutes   TherEx: Gentle PROM right shoulder flex, abduction, ER, IR 10x  Gentle AROM:  flexion, abduction, IR, ER supine position 10x each direction Supine : 1lb weight flexion 10x, scap punch 10x  Sidelying: 1lb ER, abduction  no weight 10x each ; 2 sets each  Finger walk:  -flexion 5x -abduction 5x   UE ranger: -flexion 10x -ABC    PATIENT EDUCATION: Education details: See above- extensive education this visit  Person educated: patient  Education method: Research scientist (medical), deliberate practice, positive reinforcement, explicit instruction, establish rules,  Education comprehension: adequate   HOME EXERCISE PROGRAM: Access Code: [email or web address] URL: [email or web address] Date: 01/07/2023 Prepared by: Alvera Novel  Exercises - Seated Cervical Sidebending Stretch  - 1-2 x daily - 3 sets - 30 hold - Seated Assisted Cervical Rotation with Towel  - 1-2 x daily - 3 sets - 30 hold - Sidelying Thoracic Rotation  Book Openings- Both sides  - 1 x daily - 1-2 x weekly - 1 sets - 20 reps - 1-2sec hold - Supine Cervical Retraction with Towel  - 2 x daily - 7 x weekly - 1 sets - 15 reps - 3-5sec hold - Shoulder external rotation, scapular retraction RED band  - 1 x daily - 3 x weekly - 3 sets - 15 reps - Standing Bilateral Low Shoulder Row with Anchored Resistance  - 1 x daily - 7 x weekly - 3 sets - 15 reps  Access Code: [email or web address] URL: [email or web address] Date: 02/04/2023 Prepared by: Precious Bard  Exercises - Supine Shoulder Flexion AAROM with Dowel  - 1 x daily - 7 x weekly - 2 sets - 10 reps - 5 hold - Supine Shoulder Abduction AAROM with Dowel  - 1 x daily - 7 x weekly - 2 sets - 10 reps - 5 hold - Supine Shoulder External Rotation with Dowel  - 1 x daily - 7 x weekly - 2 sets - 10 reps - 5  hold - Seated Scapular Retraction  - 1 x daily - 7 x weekly - 2 sets - 10 reps - 5 hold  ASSESSMENT:  CLINICAL IMPRESSION: Patient has significant tenderness initially that improves with repetition. She has some catching with overhead motions. Patient tolerates introduction to weights this session with fatigue but no pain.   Patient will benefit from skilled PT services to fully rehabilitate this operative shoulder to allow for full return to PLOF in UE use for ADL, IADL, and fitness/leisure.   OBJECTIVE IMPAIRMENTS: decreased activity tolerance, decreased endurance, decreased knowledge of condition, decreased ROM, decreased strength, increased muscle spasms, impaired flexibility, and improper body mechanics.   ACTIVITY LIMITATIONS: carrying, lifting, bathing, toileting, dressing, self feeding, reach over head, and hygiene/grooming  PARTICIPATION LIMITATIONS: meal prep, cleaning, laundry, driving, shopping, community activity, occupation, and yard work  PERSONAL FACTORS: Age, Behavior pattern, Education, Fitness, Past/current experiences, Profession, Social background, Time since onset of  injury/illness/exacerbation, and Transportation are also affecting patient's functional outcome.   REHAB POTENTIAL: Excellent  CLINICAL DECISION MAKING: Stable/uncomplicated  EVALUATION COMPLEXITY: Low   GOALS: Goals reviewed with patient? No  SHORT TERM GOALS: Target date: 04/30/2023    Pt to demonstrate Rt shoulder P/ROM flexion to >120 degrees, ABDCT >90 degrees, ER >45 degrees to improve ability to perform ADL/selfcare.  Baseline: 1/22: flexion: 70, abduction 38, scaption 71 3/4: MET Goal status:MET  2.  Pt to report successful HEP performance and successful adherence to postoperative restrictions.  Baseline: HEP compliant Goal status:MET  3.  Pt to demonstrate 3/5 Rt shoulder MMT to 90 degrees of flexion and 90 degrees of abduction.  Baseline: unable 1/22: not able to due to protocol Goal status: in progress  LONG TERM GOALS: Target date: 05/28/2023    Pt to improve FOTO survey goal by >60 points to indicate a monstrous reduction in difficulty of Rt limb use in a variety of questionable activities.  Baseline: 4 1/22: 44 Goal status: Deferred   2.  Pt to demonstrate 5/5 Rt shoulder flexion, abduction, ER, IR, extension; 5/5 Rt elbow flexion/extension  Baseline: 1/22: deferred due to protocol 3/4: 3/5 strength  Goal status:ongoing   3.  Pt to demonstrate >155 degrees Rt shoulder A/ROM without end-range pain>2/10.  Baseline: 1/22: flexion: 70, abduction 38, scaption 71 3/4: flexion: 115 abduction: 92 scaption:  120 Goal status: Partially Met  4.  Pt to demonstrate ability to perform gym-type exercises of resistance and aerobic natures without pain exacerbation in shoulder to allow for return to regular exercise at DC.  Baseline: 1/22: not released from protocol yet 3/4: no yet to this point of protocol  Goal status: ongoing   5   Patient will decrease Quick DASH score by > 8 points demonstrating reduced self-reported upper extremity disability. Baseline:  Goal status:  NEW  PLAN:  PT FREQUENCY: 1-2x/week  PT DURATION: 8 weeks   PLANNED INTERVENTIONS: 97110-Therapeutic exercises, 97530- Therapeutic activity, [id number]- Neuromuscular re-education, 97535- Self Care, 81191- Manual therapy, 97014- Electrical stimulation (unattended), [id number]- Electrical stimulation (manual), Patient/Family education, and Moist heat  PLAN FOR NEXT SESSION: continue per Shoulder protocol - week 4  8:00 AM, 04/09/23     Precious Bard, PT 04/09/2023, 8:00 AM

## 2023-04-09 ENCOUNTER — Ambulatory Visit

## 2023-04-09 DIAGNOSIS — M25511 Pain in right shoulder: Secondary | ICD-10-CM | POA: Diagnosis not present

## 2023-04-09 DIAGNOSIS — M6281 Muscle weakness (generalized): Secondary | ICD-10-CM

## 2023-04-09 DIAGNOSIS — M25611 Stiffness of right shoulder, not elsewhere classified: Secondary | ICD-10-CM

## 2023-04-09 DIAGNOSIS — M25619 Stiffness of unspecified shoulder, not elsewhere classified: Secondary | ICD-10-CM | POA: Diagnosis not present

## 2023-04-10 NOTE — Progress Notes (Unsigned)
 PCP: Alba Cory, MD   No chief complaint on file.   HPI:      [name] is a 53 y.o. [phone] whose LMP was No LMP recorded. Patient has had a hysterectomy., presents today for her annual examination.  Her menses are absent due to supracx hyst for AUB. No PMB. She does not have vasomotor sx. Stopped clonidine a few months ago and feels fine. Also already on gabapentin and SSRI. Did ERT in past (feels she has weight gain side effects).   Sex activity: single partner, contraception - status post hysterectomy. She does not have vaginal dryness.  Last Pap: 08/04/18 Results were: no abnormalities /neg HPV DNA. Still has cx. Hx of LEEP 2005 with benign pathology.    Last mammogram: 10/19/22  Results were: normal--routine follow-up in 12 months There is no FH of breast cancer. There is no FH of ovarian cancer. The patient does do self-breast exams. Hx of FNA LT breast in past with fibrocystic changes.   Colonoscopy: 09/22/20 with [facility] with polyps; Repeat due after 7 years.   Tobacco use: The patient denies current or previous tobacco use. Alcohol use: social drinker No drug use Exercise: very active  She does get adequate calcium and Vitamin D in her diet.  Labs with PCP.   Patient Active Problem List   Diagnosis Date Noted   Severe persistent asthma without complication 03/29/2023   Nontraumatic incomplete tear of right rotator cuff 01/03/2023   Right rotator cuff tendonitis 09/11/2022   Iron deficiency anemia due to dietary causes 08/10/2022   Chronic right shoulder pain 05/11/2022   Morbid obesity (HCC) 01/05/2022   Rotator cuff impingement syndrome, right 09/29/2021   Biceps tendinitis, right 09/29/2021   Meralgia paresthetica of right side 03/29/2021   Psoas tendinitis, right hip 03/29/2021   History of total right hip arthroplasty 03/29/2021   Status post total replacement of right hip 02/19/2021   Meralgia paresthetica of right side 02/19/2021   Polyp of  ascending colon 02/19/2021   Polyp of sigmoid colon 02/19/2021   Special screening for malignant neoplasms, colon    Polyp of ascending colon    Polyp of sigmoid colon    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 03/23/2020   Pain in right hip 01/12/2019   Vitamin D deficiency 02/25/2018   Chronic pain 10/24/2015   Hot flashes 10/24/2015   Migraine without aura and without status migrainosus, not intractable 10/24/2015   Metabolic syndrome 10/24/2015   Dyslipidemia 10/24/2015   Allergic rhinitis, seasonal 05/17/2015   Chronic insomnia 08/08/2014   DDD (degenerative disc disease), lumbar 08/08/2014   Mood disorder (HCC) 08/08/2014   Gastroesophageal reflux disease without esophagitis 08/08/2014   H/O total hysterectomy 08/08/2014   Irritable bowel syndrome with diarrhea 08/08/2014   Osteoarthritis of right hip 08/08/2014   Adult BMI 30+ 08/08/2014   SBO (spina bifida occulta) 08/08/2014    Past Surgical History:  Procedure Laterality Date   BREAST CYST ASPIRATION Left    CESAREAN SECTION  1990   CESAREAN SECTION  1993   CESAREAN SECTION  1996   COLONOSCOPY N/A 09/22/2020   Procedure: COLONOSCOPY;  Surgeon: Pasty Spillers, MD;  Location: [facility] ENDOSCOPY;  Service: Endoscopy;  Laterality: N/A;   LAPAROSCOPY  2018   LEEP  2005   benign pathology; Dr. Luella Cook   SHOULDER ACROMIOPLASTY Right 01/03/2023   Procedure: RIGHT SHOULDER ACROMIOPLASTY;  Surgeon: Huel Cote, MD;  Location: [facility] ORS;  Service: Orthopedics;  Laterality: Right;   SHOULDER ARTHROSCOPY WITH ROTATOR CUFF REPAIR AND OPEN BICEPS TENODESIS Right 01/03/2023   Procedure: RIGHT SHOULDER ARTHROSCOPY WITH ROTATOR CUFF REPAIR AND  BICEPS TENODESIS;  Surgeon: Huel Cote, MD;  Location: [facility] ORS;  Service: Orthopedics;  Laterality: Right;   SUPRACERVICAL ABDOMINAL HYSTERECTOMY  2010   leio/AUB; Dr. Luella Cook   TOTAL HIP ARTHROPLASTY Right 11/12/2018   Procedure: TOTAL HIP ARTHROPLASTY  ANTERIOR APPROACH;  Surgeon: Lyndle Herrlich, MD;  Location: [facility] ORS;  Service: Orthopedics;  Laterality: Right;    Family History  Problem Relation Age of Onset   Depression Mother    Bipolar disorder Brother    Colon cancer Maternal Grandfather        not sure of age   Breast cancer Neg Hx     Social History   Socioeconomic History   Marital status: Divorced    Spouse name: Not on file   Number of children: 3   Years of education: Not on file   Highest education level: Associate degree: academic program  Occupational History   Not on file  Tobacco Use   Smoking status: Never   Smokeless tobacco: Never  Vaping Use   Vaping status: Never Used  Substance and Sexual Activity   Alcohol use: Yes    Alcohol/week: 0.0 standard drinks of alcohol    Comment: occasionally   Drug use: No   Sexual activity: Yes    Birth control/protection: Surgical    Comment: Hysterectomy  Other Topics Concern   Not on file  Social History Narrative   Not on file   Social Drivers of Health   Financial Resource Strain: Medium Risk (03/26/2023)   Overall Financial Resource Strain (CARDIA)    Difficulty of Paying Living Expenses: Somewhat hard  Food Insecurity: Patient Declined (03/26/2023)   Hunger Vital Sign    Worried About Running Out of Food in the Last Year: Patient declined    Ran Out of Food in the Last Year: Patient declined  Transportation Needs: No Transportation Needs (03/26/2023)   PRAPARE - Administrator, Civil Service (Medical): No    Lack of Transportation (Non-Medical): No  Physical Activity: Inactive (03/26/2023)   Exercise Vital Sign    Days of Exercise per Week: 0 days    Minutes of Exercise per Session: 90 min  Stress: Stress Concern Present (03/26/2023)   [employer] of Occupational Health - Occupational Stress Questionnaire    Feeling of Stress : To some extent  Social Connections: Socially Isolated (03/26/2023)   Social Connection and Isolation  Panel [NHANES]    Frequency of Communication with Friends and Family: More than three times a week    Frequency of Social Gatherings with Friends and Family: Three times a week    Attends Religious Services: Never    Active Member of Clubs or Organizations: No    Attends Banker Meetings: Not on file    Marital Status: Divorced  Intimate Partner Violence: Not At Risk (06/02/2021)   Humiliation, Afraid, Rape, and Kick questionnaire    Fear of Current or Ex-Partner: No    Emotionally Abused: No    Physically Abused: No    Sexually Abused: No     Current Outpatient Medications:    famotidine (PEPCID) 40 MG tablet, Take 1 tablet (40 mg total) by mouth daily., Disp: 90 tablet, Rfl: 1   nitrofurantoin, macrocrystal-monohydrate, (MACROBID) 100 MG capsule, Take 1 capsule (100 mg total) by mouth 2 (  two) times daily., Disp: 10 capsule, Rfl: 0   albuterol (PROVENTIL) (2.5 MG/3ML) 0.083% nebulizer solution, Take 3 mLs (2.5 mg total) by nebulization every 6 (six) hours as needed for wheezing or shortness of breath, Disp: 75 mL, Rfl: 1   Albuterol-Budesonide (AIRSUPRA) 90-80 MCG/ACT AERO, Inhale 2 puffs into the lungs 4 (four) times daily as needed., Disp: 10.7 g, Rfl: 1   desvenlafaxine (PRISTIQ) 50 MG 24 hr tablet, Take 1 tablet (50 mg total) by mouth daily., Disp: 90 tablet, Rfl: 1   diclofenac Sodium (VOLTAREN) 1 % GEL, Apply 2 grams topically 4 (four) times daily., Disp: 100 g, Rfl: 1   fluconazole (DIFLUCAN) 150 MG tablet, Take 1 tablet (150 mg total) by mouth every other day., Disp: 2 tablet, Rfl: 0   Fluticasone-Umeclidin-Vilant (TRELEGY ELLIPTA) 200-62.5-25 MCG/ACT AEPB, Inhale 1 puff into the lungs daily. (Patient taking differently: Inhale 1 puff into the lungs at bedtime.), Disp: 60 each, Rfl: 6   gabapentin (NEURONTIN) 300 MG capsule, Take 3 capsules (900 mg total) by mouth at bedtime., Disp: 270 capsule, Rfl: 1   Galcanezumab-gnlm (EMGALITY) 120 MG/ML SOAJ, Inject 1 mL into the  skin every 30 (thirty) days., Disp: 1 mL, Rfl: 5   lidocaine (LIDODERM) 5 %, Place 2 patches onto the skin daily. Remove & Discard patch within 12 hours or as directed by MD, Disp: 60 patch, Rfl: 2   pantoprazole (PROTONIX) 40 MG tablet, Take 1 tablet (40 mg total) by mouth daily. (Patient taking differently: Take 40 mg by mouth daily as needed.), Disp: 30 tablet, Rfl: 1   prazosin (MINIPRESS) 2 MG capsule, Take 1 capsule (2 mg total) by mouth at bedtime., Disp: 90 capsule, Rfl: 0   traMADol (ULTRAM) 50 MG tablet, Take 1 tablet (50 mg total) by mouth every 6 (six) hours as needed., Disp: 30 tablet, Rfl: 0     ROS:  Review of Systems  Constitutional:  Negative for fatigue, fever and unexpected weight change.  Respiratory:  Negative for cough, shortness of breath and wheezing.   Cardiovascular:  Negative for chest pain, palpitations and leg swelling.  Gastrointestinal:  Negative for blood in stool, constipation, diarrhea, nausea and vomiting.  Endocrine: Negative for cold intolerance, heat intolerance and polyuria.  Genitourinary:  Negative for dyspareunia, dysuria, flank pain, frequency, genital sores, hematuria, menstrual problem, pelvic pain, urgency, vaginal bleeding, vaginal discharge and vaginal pain.  Musculoskeletal:  Positive for arthralgias. Negative for back pain, joint swelling and myalgias.  Skin:  Negative for rash.  Neurological:  Positive for headaches. Negative for dizziness, syncope, light-headedness and numbness.  Hematological:  Negative for adenopathy.  Psychiatric/Behavioral:  Negative for agitation, confusion, sleep disturbance and suicidal ideas. The patient is not nervous/anxious.    BREAST: No symptoms    Objective: There were no vitals taken for this visit.   Physical Exam Constitutional:      Appearance: She is well-developed.  Genitourinary:     Vulva normal.     Genitourinary Comments: UTERUS SURG REM     Right Labia: No rash, tenderness or lesions.     Left Labia: No tenderness, lesions or rash.    Vaginal cuff intact.    No vaginal discharge, erythema or tenderness.      Right Adnexa: not tender and no mass present.    Left Adnexa: not tender and no mass present.    No cervical lesion or polyp.     Uterus is absent.  Breasts:    Right: No mass, nipple  discharge, skin change or tenderness.     Left: No mass, nipple discharge, skin change or tenderness.  Neck:     Thyroid: No thyromegaly.  Cardiovascular:     Rate and Rhythm: Normal rate and regular rhythm.     Heart sounds: Normal heart sounds. No murmur heard. Pulmonary:     Effort: Pulmonary effort is normal.     Breath sounds: Normal breath sounds.  Abdominal:     Palpations: Abdomen is soft.     Tenderness: There is no abdominal tenderness. There is no guarding.  Musculoskeletal:        General: Normal range of motion.     Cervical back: Normal range of motion.  Neurological:     General: No focal deficit present.     Mental Status: She is alert and oriented to person, place, and time.     Cranial Nerves: No cranial nerve deficit.  Skin:    General: Skin is warm and dry.  Psychiatric:        Mood and Affect: Mood normal.        Behavior: Behavior normal.        Thought Content: Thought content normal.        Judgment: Judgment normal.  Vitals reviewed.     Assessment/Plan:  Encounter for annual routine gynecological examination  Encounter for screening mammogram for malignant neoplasm of breast; pt current on mammo  Vasomotor flushing--sx resolved. F/u prn.          GYN counsel breast self exam, mammography screening, menopause, adequate intake of calcium and vitamin D, diet and exercise    F/U  No follow-ups on file.  Seana Underwood B. Naphtali Riede, PA-C 04/10/2023 3:34 PM

## 2023-04-11 ENCOUNTER — Other Ambulatory Visit: Payer: Self-pay

## 2023-04-11 ENCOUNTER — Ambulatory Visit: Payer: 59 | Admitting: Obstetrics and Gynecology

## 2023-04-11 ENCOUNTER — Other Ambulatory Visit (HOSPITAL_COMMUNITY)
Admission: RE | Admit: 2023-04-11 | Discharge: 2023-04-11 | Disposition: A | Discharge status: Home / Self Care | Source: Ambulatory Visit | Attending: Obstetrics and Gynecology | Admitting: Obstetrics and Gynecology

## 2023-04-11 ENCOUNTER — Encounter: Payer: Self-pay | Admitting: Obstetrics and Gynecology

## 2023-04-11 VITALS — BP 122/84 | Ht 59.0 in | Wt 174.0 lb

## 2023-04-11 DIAGNOSIS — R399 Unspecified symptoms and signs involving the genitourinary system: Secondary | ICD-10-CM | POA: Diagnosis not present

## 2023-04-11 DIAGNOSIS — Z01419 Encounter for gynecological examination (general) (routine) without abnormal findings: Secondary | ICD-10-CM | POA: Diagnosis not present

## 2023-04-11 DIAGNOSIS — Z124 Encounter for screening for malignant neoplasm of cervix: Secondary | ICD-10-CM | POA: Diagnosis not present

## 2023-04-11 DIAGNOSIS — Z1151 Encounter for screening for human papillomavirus (HPV): Secondary | ICD-10-CM | POA: Insufficient documentation

## 2023-04-11 DIAGNOSIS — Z1231 Encounter for screening mammogram for malignant neoplasm of breast: Secondary | ICD-10-CM

## 2023-04-11 LAB — POCT URINALYSIS DIPSTICK
Bilirubin, UA: NEGATIVE
Glucose, UA: NEGATIVE
Ketones, UA: NEGATIVE
Nitrite, UA: NEGATIVE
Protein, UA: NEGATIVE
Spec Grav, UA: 1.015 (ref 1.010–1.025)
pH, UA: 5 (ref 5.0–8.0)

## 2023-04-11 MED ORDER — SULFAMETHOXAZOLE-TRIMETHOPRIM 800-160 MG PO TABS
1.0000 | ORAL_TABLET | Freq: Two times a day (BID) | ORAL | 0 refills | Status: AC
Start: 2023-04-11 — End: 2023-04-18
  Filled 2023-04-11: qty 14, 7d supply, fill #0

## 2023-04-11 NOTE — Patient Instructions (Addendum)
 I value your feedback and you entrusting [place] with your care. If you get a [place] patient survey, I would appreciate you taking the time to let us know about your experience today. Thank you! ? ? ?

## 2023-04-12 LAB — CYTOLOGY - PAP
Comment: NEGATIVE
Diagnosis: NEGATIVE
High risk HPV: NEGATIVE

## 2023-04-13 ENCOUNTER — Encounter: Payer: Self-pay | Admitting: Obstetrics and Gynecology

## 2023-04-13 LAB — URINE CULTURE: Organism ID, Bacteria: NO GROWTH

## 2023-04-14 ENCOUNTER — Encounter: Payer: Self-pay | Admitting: Obstetrics and Gynecology

## 2023-04-14 ENCOUNTER — Other Ambulatory Visit: Payer: Self-pay

## 2023-04-14 MED ORDER — METRONIDAZOLE 500 MG PO TABS
1000.0000 mg | ORAL_TABLET | Freq: Two times a day (BID) | ORAL | 0 refills | Status: DC
  Filled 2023-04-14: qty 4, 1d supply, fill #0

## 2023-04-14 NOTE — Addendum Note (Signed)
 Addended by: Althea Grimmer B on: 04/14/2023 08:34 AM   Modules accepted: Orders

## 2023-04-15 NOTE — Telephone Encounter (Signed)
 Questions answered on 2nd message.

## 2023-04-16 NOTE — Therapy (Signed)
 OUTPATIENT PHYSICAL THERAPY SHOULDER TREATMENT     Patient Name: Shelby Henson MRN: [id number] DOB:01-28-1971, 53 y.o., female Today's Date: 04/17/2023  END OF SESSION:  PT End of Session - 04/17/23 0713     Visit Number 18    Number of Visits 24    Date for PT Re-Evaluation 05/28/23    Authorization Type [facility] [employer] FOC[employer]    Authorization Time Period 01/07/23-04/07/23    Progress Note Due on Visit 10    PT Start Time 0713    PT Stop Time 0758    PT Time Calculation (min) 45 min    Activity Tolerance Patient tolerated treatment well    Behavior During Therapy [facility] for tasks assessed/performed                            Past Medical History:  Diagnosis Date   Anemia    Asthma    well controlled   Colon polyps    Complication of anesthesia    DDD (degenerative disc disease), lumbar    Depression    GERD (gastroesophageal reflux disease)    occ   Iron deficiency anemia    Migraine    Morbid obesity (HCC)    Osteoarthritis    Osteoarthritis of right hip    PONV (postoperative nausea and vomiting)    with c-section x 1   Right rotator cuff tear 11/2022   Vitamin D deficiency    Past Surgical History:  Procedure Laterality Date   BREAST CYST ASPIRATION Left    CESAREAN SECTION  1990   CESAREAN SECTION  1993   CESAREAN SECTION  1996   COLONOSCOPY N/A 09/22/2020   Procedure: COLONOSCOPY;  Surgeon: Pasty Spillers, MD;  Location: [facility] ENDOS[facility];  Service: Endoscopy;  Laterality: N/A;   LAPAROSCOPY  2018   LEEP  2005   benign pathology; Dr. Luella Cook   SHOULDER ACROMIOPLASTY Right 01/03/2023   Procedure: RIGHT SHOULDER ACROMIOPLASTY;  Surgeon: Huel Cote, MD;  Location: [facility] ORS;  Service: Orthopedics;  Laterality: Right;   SHOULDER ARTHROSCOPY WITH ROTATOR CUFF REPAIR AND OPEN BICEPS TENODESIS Right 01/03/2023   Procedure: RIGHT SHOULDER ARTHROSCOPY WITH ROTATOR CUFF REPAIR AND  BICEPS TENODESIS;  Surgeon: Huel Cote, MD;  Location:  [facility] ORS;  Service: Orthopedics;  Laterality: Right;   SUPRACERVICAL ABDOMINAL HYSTERECTOMY  2010   leio/AUB; Dr. Luella Cook   TOTAL HIP ARTHROPLASTY Right 11/12/2018   Procedure: TOTAL HIP ARTHROPLASTY ANTERIOR APPROACH;  Surgeon: Lyndle Herrlich, MD;  Location: [facility] ORS;  Service: Orthopedics;  Laterality: Right;   Patient Active Problem List   Diagnosis Date Noted   Severe persistent asthma without complication 03/29/2023   Nontraumatic incomplete tear of right rotator cuff 01/03/2023   Right rotator cuff tendonitis 09/11/2022   Iron deficiency anemia due to dietary causes 08/10/2022   Chronic right shoulder pain 05/11/2022   Morbid obesity (HCC) 01/05/2022   Rotator cuff impingement syndrome, right 09/29/2021   Biceps tendinitis, right 09/29/2021   Meralgia paresthetica of right side 03/29/2021   Psoas tendinitis, right hip 03/29/2021   History of total right hip arthroplasty 03/29/2021   Status post total replacement of right hip 02/19/2021   Meralgia paresthetica of right side 02/19/2021   Polyp of ascending colon 02/19/2021   Polyp of sigmoid colon 02/19/2021   Special screening for malignant neoplasms, colon    Polyp of ascending colon    Polyp of sigmoid colon    COVID-19  long hauler manifesting chronic loss of smell and taste 03/23/2020   Pain in right hip 01/12/2019   Vitamin D deficiency 02/25/2018   Chronic pain 10/24/2015   Hot flashes 10/24/2015   Migraine without aura and without status migrainosus, not intractable 10/24/2015   Metabolic syndrome 10/24/2015   Dyslipidemia 10/24/2015   Allergic rhinitis, seasonal 05/17/2015   Chronic insomnia 08/08/2014   DDD (degenerative disc disease), lumbar 08/08/2014   Mood disorder (HCC) 08/08/2014   Gastroesophageal reflux disease without esophagitis 08/08/2014   H/O total hysterectomy 08/08/2014   Irritable bowel syndrome with diarrhea 08/08/2014   Osteoarthritis of right hip 08/08/2014   Adult BMI 30+ 08/08/2014   SBO  (spina bifida occulta) 08/08/2014    PCP: Alba Cory, MD   REFERRING PROVIDER: Huel Cote, MD orthopedics   REFERRING DIAG: Rt arthroscopic rotator cuff repair   THERAPY DIAG:  Right shoulder pain, unspecified chronicity  Stiffness of right shoulder, not elsewhere classified  Muscle weakness (generalized)  Rationale for Evaluation and Treatment: Rehabilitation  ONSET DATE: 01/03/23  SUBJECTIVE:                                                                                                                                                                                      SUBJECTIVE STATEMENT: Patient has had some increased pain and stiffness with the weather.   Hand dominance: Right  PERTINENT HISTORY: Shelby Henson is a 53yo F presents to outpatient physical therapy on postop day 4 status post right arthroscopic shoulder debridement of supraspinatus tendon, anterior glenoid labrum, subacromial depression, rotator cuff repair with Dr. Huel Cote.  Patient reports more than 1 year of insidious onset shoulder pain previously followed by sports medicine Dr. Ashley Royalty.  PAIN:  Are you having pain? 3/10 right shoulder: Worst pain was 8 out of 10 while sleeping overnight 2 nights ago.  PRECAUTIONS: Patient in sling immobilizer and nonweightbearing x 2 weeks.  Awaiting updates pending follow-up visit with orthopedics.   WEIGHT BEARING RESTRICTIONS: Yes, right upper extremity nonweightbearing  FALLS:  Has patient fallen in last 6 months?  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 PATIENT SURVEYS:  FOTO: 4  SENSATION: WNL  POSTURE: WNL  UPPER EXTREMITY ROM:    ROM  Right eval Left eval  Shoulder flexion 90   Shoulder extension NT   Shoulder abduction NT   Shoulder internal rotation full   Shoulder external rotation  NT   Elbow flexion full   Elbow extension full    Wrist flexion full    Wrist extension full    Wrist ulnar deviation full    Wrist radial deviation full    forearm pronation full    forearm supination full    (Blank rows = not tested)    PALPATION:  Tenderness at the posterior shoulder portal wound while adjusting/donning sling   *PROTOCOL LOCATED IN MEDIA SECTION OF EPIC   TODAY'S TREATMENT:                                                                                                                                         DATE: 04/17/23   Manual therapy: (supine)  Gentle grade 1-2 GH joint mobs (inf/PA/AP) x several min Bicep lengthening stretch with pressure x multiple reps Scapular depression and retraction x4 minutes   TherEx: Gentle PROM right shoulder flex, abduction, ER, IR 10x  Supine : 1lb weight flexion 10x, scap punch 10x ; abduction no weight 10x; 2 sets each movement Sidelying: 1lb ER, abduction  no weight 10x each ; 2 sets each  Seated: AROM: flexion, abduction, ER 10x;   Finger walk:  -flexion 5x -abduction 5x   PATIENT EDUCATION: Education details: See above- extensive education this visit  Person educated: patient  Education method: collaborative learning, deliberate practice, positive reinforcement, explicit instruction, establish rules,  Education comprehension: adequate   HOME EXERCISE PROGRAM: Access Code: [email or web address] URL: [email or web address] Date: 04/17/2023 Prepared by: Precious Bard  Exercises - Seated Shoulder Flexion Full Range  - 1 x daily - 7 x weekly - 2 sets - 10 reps - 5 hold - Seated Shoulder Abduction  - 1 x daily - 7 x weekly - 2 sets - 10 reps - 5 hold - Seated Shoulder External Rotation  - 1 x daily - 7 x weekly - 2 sets - 10 reps - 5 hold - Seated  Scapular Retraction  - 1 x daily - 7 x weekly - 2 sets - 10 reps - 5 hold - Supine Shoulder Flexion with Dowel  - 1 x daily - 7 x weekly - 2 sets - 10 reps - 5 hold - Supine Shoulder External Rotation in Abduction  - 1 x daily - 7 x weekly - 2 sets - 10 reps - 5 hold - Supine Shoulder Abduction AROM  - 1 x daily - 7 x weekly - 2 sets - 10 reps - 5 hold Access Code: [email or web address] URL: [email or web address] Date: 02/04/2023 Prepared by: Precious Bard  Exercises - Supine Shoulder Flexion AAROM with Dowel  - 1 x daily - 7 x weekly - 2 sets - 10 reps - 5 hold - Supine Shoulder Abduction AAROM with Dowel  - 1 x daily - 7 x weekly - 2 sets - 10 reps - 5 hold - Supine Shoulder External Rotation with Dowel  -  1 x daily - 7 x weekly - 2 sets - 10 reps - 5 hold - Seated Scapular Retraction  - 1 x daily - 7 x weekly - 2 sets - 10 reps - 5 hold  ASSESSMENT:  CLINICAL IMPRESSION: Patient AROM progressed to gravity positioning in seated position. HEP added with patient demonstrating understanding. Physical therapist educated patient on HEP and performance within pain levels.   Patient will benefit from skilled PT services to fully rehabilitate this operative shoulder to allow for full return to PLOF in UE use for ADL, IADL, and fitness/leisure.   OBJECTIVE IMPAIRMENTS: decreased activity tolerance, decreased endurance, decreased knowledge of condition, decreased ROM, decreased strength, increased muscle spasms, impaired flexibility, and improper body mechanics.   ACTIVITY LIMITATIONS: carrying, lifting, bathing, toileting, dressing, self feeding, reach over head, and hygiene/grooming  PARTICIPATION LIMITATIONS: meal prep, cleaning, laundry, driving, shopping, community activity, occupation, and yard work  PERSONAL FACTORS: Age, Behavior pattern, Education, Fitness, Past/current experiences, Profession, Social background, Time since onset of injury/illness/exacerbation, and Transportation are also  affecting patient's functional outcome.   REHAB POTENTIAL: Excellent  CLINICAL DECISION MAKING: Stable/uncomplicated  EVALUATION COMPLEXITY: Low   GOALS: Goals reviewed with patient? No  SHORT TERM GOALS: Target date: 04/30/2023    Pt to demonstrate Rt shoulder P/ROM flexion to >120 degrees, ABDCT >90 degrees, ER >45 degrees to improve ability to perform ADL/selfcare.  Baseline: 1/22: flexion: 70, abduction 38, scaption 71 3/4: MET Goal status:MET  2.  Pt to report successful HEP performance and successful adherence to postoperative restrictions.  Baseline: HEP compliant Goal status:MET  3.  Pt to demonstrate 3/5 Rt shoulder MMT to 90 degrees of flexion and 90 degrees of abduction.  Baseline: unable 1/22: not able to due to protocol Goal status: in progress  LONG TERM GOALS: Target date: 05/28/2023    Pt to improve FOTO survey goal by >60 points to indicate a monstrous reduction in difficulty of Rt limb use in a variety of questionable activities.  Baseline: 4 1/22: 44 Goal status: Deferred   2.  Pt to demonstrate 5/5 Rt shoulder flexion, abduction, ER, IR, extension; 5/5 Rt elbow flexion/extension  Baseline: 1/22: deferred due to protocol 3/4: 3/5 strength  Goal status:ongoing   3.  Pt to demonstrate >155 degrees Rt shoulder A/ROM without end-range pain>2/10.  Baseline: 1/22: flexion: 70, abduction 38, scaption 71 3/4: flexion: 115 abduction: 92 scaption:  120 Goal status: Partially Met  4.  Pt to demonstrate ability to perform gym-type exercises of resistance and aerobic natures without pain exacerbation in shoulder to allow for return to regular exercise at DC.  Baseline: 1/22: not released from protocol yet 3/4: no yet to this point of protocol  Goal status: ongoing   5   Patient will decrease Quick DASH score by > 8 points demonstrating reduced self-reported upper extremity disability. Baseline:  Goal status: NEW  PLAN:  PT FREQUENCY: 1-2x/week  PT DURATION:  8 weeks   PLANNED INTERVENTIONS: 97110-Therapeutic exercises, 97530- Therapeutic activity, [id number]- Neuromuscular re-education, 97535- Self Care, [id number]- Manual therapy, 97014- Electrical stimulation (unattended), [id number]- Electrical stimulation (manual), Patient/Family education, and Moist heat  PLAN FOR NEXT SESSION: continue per Shoulder protocol - week 4  9:15 AM, 04/17/23     Precious Bard, PT 04/17/2023, 9:15 AM

## 2023-04-17 ENCOUNTER — Ambulatory Visit

## 2023-04-17 DIAGNOSIS — M6281 Muscle weakness (generalized): Secondary | ICD-10-CM

## 2023-04-17 DIAGNOSIS — M25511 Pain in right shoulder: Secondary | ICD-10-CM

## 2023-04-17 DIAGNOSIS — M25619 Stiffness of unspecified shoulder, not elsewhere classified: Secondary | ICD-10-CM | POA: Diagnosis not present

## 2023-04-17 DIAGNOSIS — M25611 Stiffness of right shoulder, not elsewhere classified: Secondary | ICD-10-CM

## 2023-04-18 ENCOUNTER — Ambulatory Visit: Payer: 59

## 2023-04-22 ENCOUNTER — Other Ambulatory Visit: Payer: Self-pay

## 2023-04-22 ENCOUNTER — Other Ambulatory Visit: Payer: Self-pay | Admitting: Family Medicine

## 2023-04-22 DIAGNOSIS — M25531 Pain in right wrist: Secondary | ICD-10-CM

## 2023-04-22 MED ORDER — DICLOFENAC SODIUM 1 % EX GEL
2.0000 g | Freq: Four times a day (QID) | CUTANEOUS | 1 refills | Status: AC
  Filled 2023-04-22: qty 100, 12d supply, fill #0
  Filled 2023-12-10: qty 100, 12d supply, fill #1

## 2023-04-22 NOTE — Therapy (Signed)
 OUTPATIENT PHYSICAL THERAPY SHOULDER TREATMENT     Patient Name: Shelby Henson MRN: [id number] DOB:March 18, 1970, 53 y.o., female Today's Date: 04/23/2023  END OF SESSION:  PT End of Session - 04/23/23 0715     Visit Number 19    Number of Visits 24    Date for PT Re-Evaluation 05/28/23    Authorization Type [facility] [employer] FOCUS    Authorization Time Period 01/07/23-04/07/23    Progress Note Due on Visit 10    PT Start Time 0712    PT Stop Time 0757    PT Time Calculation (min) 45 min    Activity Tolerance Patient tolerated treatment well    Behavior During Therapy [facility] for tasks assessed/performed                             Past Medical History:  Diagnosis Date   Anemia    Asthma    well controlled   Colon polyps    Complication of anesthesia    DDD (degenerative disc disease), lumbar    Depression    GERD (gastroesophageal reflux disease)    occ   Iron deficiency anemia    Migraine    Morbid obesity (HCC)    Osteoarthritis    Osteoarthritis of right hip    PONV (postoperative nausea and vomiting)    with c-section x 1   Right rotator cuff tear 11/2022   Vitamin D deficiency    Past Surgical History:  Procedure Laterality Date   BREAST CYST ASPIRATION Left    CESAREAN SECTION  1990   CESAREAN SECTION  1993   CESAREAN SECTION  1996   COLONOSCOPY N/A 09/22/2020   Procedure: COLONOSCOPY;  Surgeon: Pasty Spillers, MD;  Location: [facility];  Service: Endoscopy;  Laterality: N/A;   LAPAROSCOPY  2018   LEEP  2005   benign pathology; Dr. Luella Cook   SHOULDER ACROMIOPLASTY Right 01/03/2023   Procedure: RIGHT SHOULDER ACROMIOPLASTY;  Surgeon: Huel Cote, MD;  Location: [facility] ORS;  Service: Orthopedics;  Laterality: Right;   SHOULDER ARTHROSCOPY WITH ROTATOR CUFF REPAIR AND OPEN BICEPS TENODESIS Right 01/03/2023   Procedure: RIGHT SHOULDER ARTHROSCOPY WITH ROTATOR CUFF REPAIR AND  BICEPS TENODESIS;  Surgeon: Huel Cote, MD;  Location:  [facility] ORS;  Service: Orthopedics;  Laterality: Right;   SUPRACERVICAL ABDOMINAL HYSTERECTOMY  2010   leio/AUB; Dr. Luella Cook   TOTAL HIP ARTHROPLASTY Right 11/12/2018   Procedure: TOTAL HIP ARTHROPLASTY ANTERIOR APPROACH;  Surgeon: Lyndle Herrlich, MD;  Location: [facility] ORS;  Service: Orthopedics;  Laterality: Right;   Patient Active Problem List   Diagnosis Date Noted   Severe persistent asthma without complication 03/29/2023   Nontraumatic incomplete tear of right rotator cuff 01/03/2023   Right rotator cuff tendonitis 09/11/2022   Iron deficiency anemia due to dietary causes 08/10/2022   Chronic right shoulder pain 05/11/2022   Morbid obesity (HCC) 01/05/2022   Rotator cuff impingement syndrome, right 09/29/2021   Biceps tendinitis, right 09/29/2021   Meralgia paresthetica of right side 03/29/2021   Psoas tendinitis, right hip 03/29/2021   History of total right hip arthroplasty 03/29/2021   Status post total replacement of right hip 02/19/2021   Meralgia paresthetica of right side 02/19/2021   Polyp of ascending colon 02/19/2021   Polyp of sigmoid colon 02/19/2021   Special screening for malignant neoplasms, colon    Polyp of ascending colon    Polyp of sigmoid colon  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 03/23/2020   Pain in right hip 01/12/2019   Vitamin D deficiency 02/25/2018   Chronic pain 10/24/2015   Hot flashes 10/24/2015   Migraine without aura and without status migrainosus, not intractable 10/24/2015   Metabolic syndrome 10/24/2015   Dyslipidemia 10/24/2015   Allergic rhinitis, seasonal 05/17/2015   Chronic insomnia 08/08/2014   DDD (degenerative disc disease), lumbar 08/08/2014   Mood disorder (HCC) 08/08/2014   Gastroesophageal reflux disease without esophagitis 08/08/2014   H/O total hysterectomy 08/08/2014   Irritable bowel syndrome with diarrhea 08/08/2014   Osteoarthritis of right hip 08/08/2014   Adult BMI 30+ 08/08/2014   SBO  (spina bifida occulta) 08/08/2014    PCP: Alba Cory, MD   REFERRING PROVIDER: Huel Cote, MD orthopedics   REFERRING DIAG: Rt arthroscopic rotator cuff repair   THERAPY DIAG:  Right shoulder pain, unspecified chronicity  Stiffness of right shoulder, not elsewhere classified  Muscle weakness (generalized)  Rationale for Evaluation and Treatment: Rehabilitation  ONSET DATE: 01/03/23  SUBJECTIVE:                                                                                                                                                                                      SUBJECTIVE STATEMENT: Patient has been compliant with overhead movement  Hand dominance: Right  PERTINENT HISTORY: Shelby Henson is a 53yo F presents to outpatient physical therapy on postop day 4 status post right arthroscopic shoulder debridement of supraspinatus tendon, anterior glenoid labrum, subacromial depression, rotator cuff repair with Dr. Huel Cote.  Patient reports more than 1 year of insidious onset shoulder pain previously followed by sports medicine Dr. Ashley Royalty.  PAIN:  Are you having pain? 3/10 right shoulder: Worst pain was 8 out of 10 while sleeping overnight 2 nights ago.  PRECAUTIONS: Patient in sling immobilizer and nonweightbearing x 2 weeks.  Awaiting updates pending follow-up visit with orthopedics.   WEIGHT BEARING RESTRICTIONS: Yes, right upper extremity nonweightbearing  FALLS:  Has patient fallen in last 6 months?  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 PATIENT SURVEYS:  FOTO: 4  SENSATION: WNL  POSTURE: WNL  UPPER EXTREMITY ROM:    ROM Right eval Left eval   Shoulder flexion 90   Shoulder extension NT   Shoulder abduction NT   Shoulder internal rotation full   Shoulder external rotation NT   Elbow  flexion full   Elbow extension full    Wrist flexion full    Wrist extension full    Wrist ulnar deviation full    Wrist radial deviation full    forearm pronation full    forearm supination full    (Blank rows = not tested)    PALPATION:  Tenderness at the posterior shoulder portal wound while adjusting/donning sling   *PROTOCOL LOCATED IN MEDIA SECTION OF EPIC   TODAY'S TREATMENT:                                                                                                                                         DATE: 04/23/23   Manual therapy: (supine)  Gentle grade 1-2 GH joint mobs (inf/PA/AP) x several min Bicep lengthening stretch with pressure x multiple reps   TherEx: Gentle PROM right shoulder flex, abduction, ER, IR 10x  Supine : 1lb weight: flexion 10x, scap punch 10x ;bicep curl 10x; 2 sets each movement Sidelying: 1lb ER, abduction  no weight 10x each ; 2 sets each   Seated: Sci fit : 3 minutes forward, 3 minutes backwards ; lvl 4   Finger walk:  -flexion 5x -abduction 5x   PATIENT EDUCATION: Education details: See above- extensive education this visit  Person educated: patient  Education method: Research scientist (medical), deliberate practice, positive reinforcement, explicit instruction, establish rules,  Education comprehension: adequate   HOME EXERCISE PROGRAM: Access Code: [email or web address] URL: [email or web address] Date: 04/17/2023 Prepared by: Precious Bard  Exercises - Seated Shoulder Flexion Full Range  - 1 x daily - 7 x weekly - 2 sets - 10 reps - 5 hold - Seated Shoulder Abduction  - 1 x daily - 7 x weekly - 2 sets - 10 reps - 5 hold - Seated Shoulder External Rotation  - 1 x daily - 7 x weekly - 2 sets - 10 reps - 5 hold - Seated Scapular Retraction  - 1 x daily - 7 x weekly - 2 sets -  10 reps - 5 hold - Supine Shoulder Flexion with Dowel  - 1 x daily - 7 x weekly - 2 sets - 10 reps - 5 hold - Supine Shoulder External Rotation in Abduction  - 1 x daily - 7 x weekly - 2 sets - 10 reps - 5 hold - Supine Shoulder Abduction AROM  - 1 x daily - 7 x weekly - 2 sets - 10 reps - 5 hold Access Code: [email or web address] URL: [email or web address] Date: 02/04/2023 Prepared by: Precious Bard  Exercises - Supine Shoulder Flexion AAROM with Dowel  - 1 x daily - 7 x weekly - 2 sets - 10 reps - 5 hold - Supine Shoulder Abduction AAROM with Dowel  - 1 x daily - 7 x weekly - 2 sets - 10 reps - 5 hold - Supine Shoulder External Rotation with Dowel  - 1 x daily -  7 x weekly - 2 sets - 10 reps - 5 hold - Seated Scapular Retraction  - 1 x daily - 7 x weekly - 2 sets - 10 reps - 5 hold  ASSESSMENT:  CLINICAL IMPRESSION: Patient is tolerating progressive strengthening without pain. She is introduced to SciFit without pain increased. Patient is compliant with HEP.   Patient will benefit from skilled PT services to fully rehabilitate this operative shoulder to allow for full return to PLOF in UE use for ADL, IADL, and fitness/leisure.   OBJECTIVE IMPAIRMENTS: decreased activity tolerance, decreased endurance, decreased knowledge of condition, decreased ROM, decreased strength, increased muscle spasms, impaired flexibility, and improper body mechanics.   ACTIVITY LIMITATIONS: carrying, lifting, bathing, toileting, dressing, self feeding, reach over head, and hygiene/grooming  PARTICIPATION LIMITATIONS: meal prep, cleaning, laundry, driving, shopping, community activity, occupation, and yard work  PERSONAL FACTORS: Age, Behavior pattern, Education, Fitness, Past/current experiences, Profession, Social background, Time since onset of injury/illness/exacerbation, and Transportation are also affecting patient's functional outcome.   REHAB POTENTIAL: Excellent  CLINICAL DECISION MAKING:  Stable/uncomplicated  EVALUATION COMPLEXITY: Low   GOALS: Goals reviewed with patient? No  SHORT TERM GOALS: Target date: 04/30/2023    Pt to demonstrate Rt shoulder P/ROM flexion to >120 degrees, ABDCT >90 degrees, ER >45 degrees to improve ability to perform ADL/selfcare.  Baseline: 1/22: flexion: 70, abduction 38, scaption 71 3/4: MET Goal status:MET  2.  Pt to report successful HEP performance and successful adherence to postoperative restrictions.  Baseline: HEP compliant Goal status:MET  3.  Pt to demonstrate 3/5 Rt shoulder MMT to 90 degrees of flexion and 90 degrees of abduction.  Baseline: unable 1/22: not able to due to protocol Goal status: in progress  LONG TERM GOALS: Target date: 05/28/2023    Pt to improve FOTO survey goal by >60 points to indicate a monstrous reduction in difficulty of Rt limb use in a variety of questionable activities.  Baseline: 4 1/22: 44 Goal status: Deferred   2.  Pt to demonstrate 5/5 Rt shoulder flexion, abduction, ER, IR, extension; 5/5 Rt elbow flexion/extension  Baseline: 1/22: deferred due to protocol 3/4: 3/5 strength  Goal status:ongoing   3.  Pt to demonstrate >155 degrees Rt shoulder A/ROM without end-range pain>2/10.  Baseline: 1/22: flexion: 70, abduction 38, scaption 71 3/4: flexion: 115 abduction: 92 scaption:  120 Goal status: Partially Met  4.  Pt to demonstrate ability to perform gym-type exercises of resistance and aerobic natures without pain exacerbation in shoulder to allow for return to regular exercise at DC.  Baseline: 1/22: not released from protocol yet 3/4: no yet to this point of protocol  Goal status: ongoing   5   Patient will decrease Quick DASH score by > 8 points demonstrating reduced self-reported upper extremity disability. Baseline:  Goal status: NEW  PLAN:  PT FREQUENCY: 1-2x/week  PT DURATION: 8 weeks   PLANNED INTERVENTIONS: 97110-Therapeutic exercises, 97530- Therapeutic activity, [id number]  Neuromuscular re-education, 97535- Self Care, 96045- Manual therapy, 97014- Electrical stimulation (unattended), [id number]- Electrical stimulation (manual), Patient/Family education, and Moist heat  PLAN FOR NEXT SESSION: continue per Shoulder protocol - week 4  7:58 AM, 04/23/23     Precious Bard, PT 04/23/2023, 7:58 AM

## 2023-04-23 ENCOUNTER — Ambulatory Visit

## 2023-04-23 DIAGNOSIS — M25611 Stiffness of right shoulder, not elsewhere classified: Secondary | ICD-10-CM | POA: Diagnosis not present

## 2023-04-23 DIAGNOSIS — M6281 Muscle weakness (generalized): Secondary | ICD-10-CM

## 2023-04-23 DIAGNOSIS — M25619 Stiffness of unspecified shoulder, not elsewhere classified: Secondary | ICD-10-CM | POA: Diagnosis not present

## 2023-04-23 DIAGNOSIS — M25511 Pain in right shoulder: Secondary | ICD-10-CM

## 2023-04-25 ENCOUNTER — Ambulatory Visit: Payer: 59

## 2023-04-30 ENCOUNTER — Ambulatory Visit: Attending: Orthopaedic Surgery

## 2023-04-30 DIAGNOSIS — M6281 Muscle weakness (generalized): Secondary | ICD-10-CM | POA: Insufficient documentation

## 2023-04-30 DIAGNOSIS — M25511 Pain in right shoulder: Secondary | ICD-10-CM | POA: Diagnosis not present

## 2023-04-30 DIAGNOSIS — M25611 Stiffness of right shoulder, not elsewhere classified: Secondary | ICD-10-CM | POA: Diagnosis not present

## 2023-04-30 NOTE — Therapy (Signed)
 OUTPATIENT PHYSICAL THERAPY SHOULDER TREATMENT /Physical Therapy Progress Note   Dates of reporting period  02/20/23   to   04/30/23       Patient Name: Shelby Henson MRN: [id number] DOB:April 19, 1970, 53 y.o., female Today's Date: 04/30/2023  END OF SESSION:  PT End of Session - 04/30/23 4098     Visit Number 20    Number of Visits 24    Date for PT Re-Evaluation 05/28/23    Authorization Type [facility] [employer]    Authorization Time Period 01/07/23-04/07/23    Progress Note Due on Visit 10    PT Start Time 0713    PT Stop Time 0758    PT Time Calculation (min) 45 min    Activity Tolerance Patient tolerated treatment well    Behavior During Therapy [facility] for tasks assessed/performed                             Past Medical History:  Diagnosis Date   Anemia    Asthma    well controlled   Colon polyps    Complication of anesthesia    DDD (degenerative disc disease), lumbar    Depression    GERD (gastroesophageal reflux disease)    occ   Iron deficiency anemia    Migraine    Morbid obesity (HCC)    Osteoarthritis    Osteoarthritis of right hip    PONV (postoperative nausea and vomiting)    with c-section x 1   Right rotator cuff tear 11/2022   Vitamin D deficiency    Past Surgical History:  Procedure Laterality Date   BREAST CYST ASPIRATION Left    CESAREAN SECTION  1990   CESAREAN SECTION  1993   CESAREAN SECTION  1996   COLONOSCOPY N/A 09/22/2020   Procedure: COLONOSCOPY;  Surgeon: Pasty Spillers, MD;  Location: [facility] ENDOS[facility];  Service: Endoscopy;  Laterality: N/A;   LAPAROSCOPY  2018   LEEP  2005   benign pathology; Dr. Luella Cook   SHOULDER ACROMIOPLASTY Right 01/03/2023   Procedure: RIGHT SHOULDER ACROMIOPLASTY;  Surgeon: Huel Cote, MD;  Location: [facility] ORS;  Service: Orthopedics;  Laterality: Right;   SHOULDER ARTHROSCOPY WITH ROTATOR CUFF REPAIR AND OPEN BICEPS TENODESIS Right 01/03/2023   Procedure: RIGHT SHOULDER ARTHROSCOPY  WITH ROTATOR CUFF REPAIR AND  BICEPS TENODESIS;  Surgeon: Huel Cote, MD;  Location: [facility] ORS;  Service: Orthopedics;  Laterality: Right;   SUPRACERVICAL ABDOMINAL HYSTERECTOMY  2010   leio/AUB; Dr. Luella Cook   TOTAL HIP ARTHROPLASTY Right 11/12/2018   Procedure: TOTAL HIP ARTHROPLASTY ANTERIOR APPROACH;  Surgeon: Lyndle Herrlich, MD;  Location: [facility] ORS;  Service: Orthopedics;  Laterality: Right;   Patient Active Problem List   Diagnosis Date Noted   Severe persistent asthma without complication 03/29/2023   Nontraumatic incomplete tear of right rotator cuff 01/03/2023   Right rotator cuff tendonitis 09/11/2022   Iron deficiency anemia due to dietary causes 08/10/2022   Chronic right shoulder pain 05/11/2022   Morbid obesity (HCC) 01/05/2022   Rotator cuff impingement syndrome, right 09/29/2021   Biceps tendinitis, right 09/29/2021   Meralgia paresthetica of right side 03/29/2021   Psoas tendinitis, right hip 03/29/2021   History of total right hip arthroplasty 03/29/2021   Status post total replacement of right hip 02/19/2021   Meralgia paresthetica of right side 02/19/2021   Polyp of ascending colon 02/19/2021   Polyp of sigmoid colon 02/19/2021   Special screening for  malignant neoplasms, colon    Polyp of ascending colon    Polyp of sigmoid colon    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 03/23/2020   Pain in right hip 01/12/2019   Vitamin D deficiency 02/25/2018   Chronic pain 10/24/2015   Hot flashes 10/24/2015   Migraine without aura and without status migrainosus, not intractable 10/24/2015   Metabolic syndrome 10/24/2015   Dyslipidemia 10/24/2015   Allergic rhinitis, seasonal 05/17/2015   Chronic insomnia 08/08/2014   DDD (degenerative disc disease), lumbar 08/08/2014   Mood disorder (HCC) 08/08/2014   Gastroesophageal reflux disease without esophagitis 08/08/2014   H/O total hysterectomy 08/08/2014   Irritable bowel syndrome with diarrhea  08/08/2014   Osteoarthritis of right hip 08/08/2014   Adult BMI 30+ 08/08/2014   SBO (spina bifida occulta) 08/08/2014    PCP: Alba Cory, MD   REFERRING PROVIDER: Huel Cote, MD orthopedics   REFERRING DIAG: Rt arthroscopic rotator cuff repair   THERAPY DIAG:  Right shoulder pain, unspecified chronicity  Stiffness of right shoulder, not elsewhere classified  Muscle weakness (generalized)  Rationale for Evaluation and Treatment: Rehabilitation  ONSET DATE: 01/03/23  SUBJECTIVE:                                                                                                                                                                                      SUBJECTIVE STATEMENT: Patient reports she still has difficulty putting her bra on.   Hand dominance: Right  PERTINENT HISTORY: Shelby Henson is a 53yo F presents to outpatient physical therapy on postop day 4 status post right arthroscopic shoulder debridement of supraspinatus tendon, anterior glenoid labrum, subacromial depression, rotator cuff repair with Dr. Huel Cote.  Patient reports more than 1 year of insidious onset shoulder pain previously followed by sports medicine Dr. Ashley Royalty.  PAIN:  Are you having pain? 3/10 right shoulder: Worst pain was 8 out of 10 while sleeping overnight 2 nights ago.  PRECAUTIONS: Patient in sling immobilizer and nonweightbearing x 2 weeks.  Awaiting updates pending follow-up visit with orthopedics.   WEIGHT BEARING RESTRICTIONS: Yes, right upper extremity nonweightbearing  FALLS:  Has patient fallen in last 6 months?  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 PATIENT SURVEYS:   FOTO: 4  SENSATION: WNL  POSTURE: WNL  UPPER EXTREMITY ROM:    ROM Right eval Left eval  Shoulder flexion 90  Shoulder extension NT   Shoulder abduction NT   Shoulder internal rotation full   Shoulder external rotation NT   Elbow flexion full   Elbow extension full    Wrist flexion full    Wrist extension full    Wrist ulnar deviation full    Wrist radial deviation full    forearm pronation full    forearm supination full    (Blank rows = not tested)    PALPATION:  Tenderness at the posterior shoulder portal wound while adjusting/donning sling   *PROTOCOL LOCATED IN MEDIA SECTION OF EPIC   TODAY'S TREATMENT:                                                                                                                                         DATE: 04/30/23  Physical therapy treatment session today consisted of completing assessment of goals and administration of testing as demonstrated and documented in flow sheet, treatment, and goals section of this note. Addition treatments may be found below.    Manual therapy: (supine)  Gentle grade 1-2 GH joint mobs (inf/PA/AP) x several min Bicep lengthening stretch with pressure x multiple reps   TherEx: Gentle PROM right shoulder flex, abduction, ER, IR 10x  Supine : 3lb weight: flexion 10x, scap punch 10x ;bicep curl 10x; 2 sets each movement Sidelying: 3lb ER, abduction  no weight 10x each ; 2 sets each   Seated: Sci fit : 3 minutes forward, 3 minutes backwards ; lvl 4  IR towel stretch 3x30 seconds   Finger walk:  -flexion 5x -abduction 5x   PATIENT EDUCATION: Education details: See above- extensive education this visit  Person educated: patient  Education method: Research scientist (medical), deliberate practice, positive reinforcement, explicit instruction, establish rules,  Education comprehension: adequate   HOME EXERCISE PROGRAM: Access Code: [email or web address] URL: [email or web address] Date:  04/17/2023 Prepared by: Precious Bard  Exercises - Seated Shoulder Flexion Full Range  - 1 x daily - 7 x weekly - 2 sets - 10 reps - 5 hold - Seated Shoulder Abduction  - 1 x daily - 7 x weekly - 2 sets - 10 reps - 5 hold - Seated Shoulder External Rotation  - 1 x daily - 7 x weekly - 2 sets - 10 reps - 5 hold - Seated Scapular Retraction  - 1 x daily - 7 x weekly - 2 sets - 10 reps - 5 hold - Supine Shoulder Flexion with Dowel  - 1 x daily - 7 x weekly - 2 sets - 10 reps - 5 hold - Supine Shoulder External Rotation in Abduction  - 1 x daily - 7 x weekly - 2 sets - 10 reps - 5 hold - Supine Shoulder Abduction AROM  - 1 x daily - 7 x weekly - 2 sets - 10 reps - 5 hold Access Code: [email or web address] URL: [email or web address] Date: 02/04/2023 Prepared by: Jearld Adjutant  Yania Bogie  Exercises - Supine Shoulder Flexion AAROM with Dowel  - 1 x daily - 7 x weekly - 2 sets - 10 reps - 5 hold - Supine Shoulder Abduction AAROM with Dowel  - 1 x daily - 7 x weekly - 2 sets - 10 reps - 5 hold - Supine Shoulder External Rotation with Dowel  - 1 x daily - 7 x weekly - 2 sets - 10 reps - 5 hold - Seated Scapular Retraction  - 1 x daily - 7 x weekly - 2 sets - 10 reps - 5 hold  ASSESSMENT:  CLINICAL IMPRESSION: Patient's condition has the potential to improve in response to therapy. Maximum improvement is yet to be obtained. The anticipated improvement is attainable and reasonable in a generally predictable time. Patient has significant improvement of Rom and strength and is progressing towards functional goals at this time.   Patient will benefit from skilled PT services to fully rehabilitate this operative shoulder to allow for full return to PLOF in UE use for ADL, IADL, and fitness/leisure.   OBJECTIVE IMPAIRMENTS: decreased activity tolerance, decreased endurance, decreased knowledge of condition, decreased ROM, decreased strength, increased muscle spasms, impaired flexibility, and improper body mechanics.    ACTIVITY LIMITATIONS: carrying, lifting, bathing, toileting, dressing, self feeding, reach over head, and hygiene/grooming  PARTICIPATION LIMITATIONS: meal prep, cleaning, laundry, driving, shopping, community activity, occupation, and yard work  PERSONAL FACTORS: Age, Behavior pattern, Education, Fitness, Past/current experiences, Profession, Social background, Time since onset of injury/illness/exacerbation, and Transportation are also affecting patient's functional outcome.   REHAB POTENTIAL: Excellent  CLINICAL DECISION MAKING: Stable/uncomplicated  EVALUATION COMPLEXITY: Low   GOALS: Goals reviewed with patient? No  SHORT TERM GOALS: Target date: 04/30/2023    Pt to demonstrate Rt shoulder P/ROM flexion to >120 degrees, ABDCT >90 degrees, ER >45 degrees to improve ability to perform ADL/selfcare.  Baseline: 1/22: flexion: 70, abduction 38, scaption 71 3/4: MET Goal status:MET  2.  Pt to report successful HEP performance and successful adherence to postoperative restrictions.  Baseline: HEP compliant Goal status:MET  3.  Pt to demonstrate 3/5 Rt shoulder MMT to 90 degrees of flexion and 90 degrees of abduction.  Baseline: unable 1/22: not able to due to protocol Goal status: in progress  LONG TERM GOALS: Target date: 05/28/2023    Pt to improve FOTO survey goal by >60 points to indicate a monstrous reduction in difficulty of Rt limb use in a variety of questionable activities.  Baseline: 4 1/22: 44 Goal status: Deferred   2.  Pt to demonstrate 5/5 Rt shoulder flexion, abduction, ER, IR, extension; 5/5 Rt elbow flexion/extension  Baseline: 1/22: deferred due to protocol 3/4: 3/5 strength  4/1: flexion 4/5, extension 4/5 abduction 3/5  Goal status:ongoing   3.  Pt to demonstrate >155 degrees Rt shoulder A/ROM without end-range pain>2/10.  Baseline: 1/22: flexion: 70, abduction 38, scaption 71 3/4: flexion: 115 abduction: 92 scaption:  120 4/1: flexion: 131 abduction:  138; scaption: 150 Goal status: Partially Met  4.  Pt to demonstrate ability to perform gym-type exercises of resistance and aerobic natures without pain exacerbation in shoulder to allow for return to regular exercise at DC.  Baseline: 1/22: not released from protocol yet 3/4: no yet to this point of protocol  Goal status: ongoing   5   Patient will decrease Quick DASH score by > 8 points demonstrating reduced self-reported upper extremity disability. Baseline: 4/1: 37%  Goal status: NEW  PLAN:  PT FREQUENCY: 1-2x/week  PT DURATION: 8 weeks   PLANNED INTERVENTIONS: 97110-Therapeutic exercises, 97530- Therapeutic activity, [id number]- Neuromuscular re-education, 97535- Self Care, 16109- Manual therapy, 97014- Electrical stimulation (unattended), [phone]- Electrical stimulation (manual), Patient/Family education, and Moist heat  PLAN FOR NEXT SESSION: continue per Shoulder protocol  8:02 AM, 04/30/23     Precious Bard, PT 04/30/2023, 8:02 AM

## 2023-05-02 ENCOUNTER — Ambulatory Visit: Payer: 59

## 2023-05-03 ENCOUNTER — Ambulatory Visit: Payer: 59

## 2023-05-08 ENCOUNTER — Encounter (HOSPITAL_BASED_OUTPATIENT_CLINIC_OR_DEPARTMENT_OTHER): Payer: Self-pay | Admitting: Orthopaedic Surgery

## 2023-05-08 NOTE — Therapy (Signed)
 OUTPATIENT PHYSICAL THERAPY SHOULDER TREATMENT        Patient Name: Shelby Henson MRN: [id number] DOB:1970-06-25, 53 y.o., female Today's Date: 05/08/2023  END OF SESSION:                    Past Medical History:  Diagnosis Date   Anemia    Asthma    well controlled   Colon polyps    Complication of anesthesia    DDD (degenerative disc disease), lumbar    Depression    GERD (gastroesophageal reflux disease)    occ   Iron deficiency anemia    Migraine    Morbid obesity (HCC)    Osteoarthritis    Osteoarthritis of right hip    PONV (postoperative nausea and vomiting)    with c-section x 1   Right rotator cuff tear 11/2022   Vitamin D deficiency    Past Surgical History:  Procedure Laterality Date   BREAST CYST ASPIRATION Left    CESAREAN SECTION  1990   CESAREAN SECTION  1993   CESAREAN SECTION  1996   COLONOSCOPY N/A 09/22/2020   Procedure: COLONOSCOPY;  Surgeon: Pasty Spillers, MD;  Location: [facility];  Service: Endoscopy;  Laterality: N/A;   LAPAROSCOPY  2018   LEEP  2005   benign pathology; Dr. Luella Cook   SHOULDER ACROMIOPLASTY Right 01/03/2023   Procedure: RIGHT SHOULDER ACROMIOPLASTY;  Surgeon: Huel Cote, MD;  Location: [facility] OR[facility];  Service: Orthopedics;  Laterality: Right;   SHOULDER ARTHROSCOPY WITH ROTATOR CUFF REPAIR AND OPEN BICEPS TENODESIS Right 01/03/2023   Procedure: RIGHT SHOULDER ARTHROSCOPY WITH ROTATOR CUFF REPAIR AND  BICEPS TENODESIS;  Surgeon: Huel Cote, MD;  Location: [facility];  Service: Orthopedics;  Laterality: Right;   SUPRACERVICAL ABDOMINAL HYSTERECTOMY  2010   leio/AUB; Dr. Luella Cook   TOTAL HIP ARTHROPLASTY Right 11/12/2018   Procedure: TOTAL HIP ARTHROPLASTY ANTERIOR APPROACH;  Surgeon: Lyndle Herrlich, MD;  Location: [facility];  Service: Orthopedics;  Laterality: Right;   Patient Active Problem List   Diagnosis Date Noted   Severe persistent asthma without complication 03/29/2023   Nontraumatic  incomplete tear of right rotator cuff 01/03/2023   Right rotator cuff tendonitis 09/11/2022   Iron deficiency anemia due to dietary causes 08/10/2022   Chronic right shoulder pain 05/11/2022   Morbid obesity (HCC) 01/05/2022   Rotator cuff impingement syndrome, right 09/29/2021   Biceps tendinitis, right 09/29/2021   Meralgia paresthetica of right side 03/29/2021   Psoas tendinitis, right hip 03/29/2021   History of total right hip arthroplasty 03/29/2021   Status post total replacement of right hip 02/19/2021   Meralgia paresthetica of right side 02/19/2021   Polyp of ascending colon 02/19/2021   Polyp of sigmoid colon 02/19/2021   Special screening for malignant neoplasms, colon    Polyp of ascending colon    Polyp of sigmoid colon    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 03/23/2020   Pain in right hip 01/12/2019   Vitamin D deficiency 02/25/2018   Chronic pain 10/24/2015   Hot flashes 10/24/2015   Migraine without aura and without status migrainosus, not intractable 10/24/2015   Metabolic syndrome 10/24/2015   Dyslipidemia 10/24/2015   Allergic rhinitis, seasonal 05/17/2015   Chronic insomnia 08/08/2014   DDD (degenerative disc disease), lumbar 08/08/2014   Mood disorder (HCC) 08/08/2014   Gastroesophageal reflux disease without esophagitis 08/08/2014   H/O total hysterectomy 08/08/2014   Irritable bowel syndrome with diarrhea 08/08/2014  Osteoarthritis of right hip 08/08/2014   Adult BMI 30+ 08/08/2014   SBO (spina bifida occulta) 08/08/2014    PCP: Alba Cory, MD   REFERRING PROVIDER: Huel Cote, MD orthopedics   REFERRING DIAG: Rt arthroscopic rotator cuff repair   THERAPY DIAG:  No diagnosis found.  Rationale for Evaluation and Treatment: Rehabilitation  ONSET DATE: 01/03/23  SUBJECTIVE:                                                                                                                                                                                       SUBJECTIVE STATEMENT: ***  Hand dominance: Right  PERTINENT HISTORY: Shelby Henson is a 53yo F presents to outpatient physical therapy on postop day 4 status post right arthroscopic shoulder debridement of supraspinatus tendon, anterior glenoid labrum, subacromial depression, rotator cuff repair with Dr. Huel Cote.  Patient reports more than 1 year of insidious onset shoulder pain previously followed by sports medicine Dr. Ashley Royalty.  PAIN:  Are you having pain? 3/10 right shoulder: Worst pain was 8 out of 10 while sleeping overnight 2 nights ago.  PRECAUTIONS: Patient in sling immobilizer and nonweightbearing x 2 weeks.  Awaiting updates pending follow-up visit with orthopedics.   WEIGHT BEARING RESTRICTIONS: Yes, right upper extremity nonweightbearing  FALLS:  Has patient fallen in last 6 months?  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 PATIENT SURVEYS:  FOTO: 4  SENSATION: WNL  POSTURE: WNL  UPPER EXTREMITY ROM:    ROM Right eval Left eval  Shoulder flexion 90   Shoulder extension NT   Shoulder abduction NT   Shoulder internal rotation full   Shoulder external rotation NT   Elbow flexion full   Elbow extension full    Wrist flexion full    Wrist extension full    Wrist ulnar deviation full    Wrist radial deviation full    forearm pronation full    forearm supination full    (Blank rows = not tested)    PALPATION:  Tenderness at the posterior shoulder portal wound while adjusting/donning sling   *PROTOCOL LOCATED IN MEDIA SECTION OF EPIC   TODAY'S TREATMENT:  DATE: 05/08/23    Manual therapy: (supine)  Gentle grade 1-2 GH joint mobs (inf/PA/AP) x several min Bicep lengthening stretch with pressure x multiple reps   TherEx: Gentle PROM right shoulder flex, abduction, ER, IR 10x  Supine : 3lb weight: flexion 10x, scap punch 10x ;bicep curl 10x; 2 sets each movement Sidelying: 3lb ER, abduction  no weight 10x each ; 2 sets each   Seated: Sci fit : 3 minutes forward, 3 minutes backwards ; lvl 4  IR towel stretch 3x30 seconds   Finger walk:  -flexion 5x -abduction 5x   PATIENT EDUCATION: Education details: See above- extensive education this visit  Person educated: patient  Education method: Research scientist (medical), deliberate practice, positive reinforcement, explicit instruction, establish rules,  Education comprehension: adequate   HOME EXERCISE PROGRAM: Access Code: [email or web address] URL: [email or web address] Date: 04/17/2023 Prepared by: Precious Bard  Exercises - Seated Shoulder Flexion Full Range  - 1 x daily - 7 x weekly - 2 sets - 10 reps - 5 hold - Seated Shoulder Abduction  - 1 x daily - 7 x weekly - 2 sets - 10 reps - 5 hold - Seated Shoulder External Rotation  - 1 x daily - 7 x weekly - 2 sets - 10 reps - 5 hold - Seated Scapular Retraction  - 1 x daily - 7 x weekly - 2 sets - 10 reps - 5 hold - Supine Shoulder Flexion with Dowel  - 1 x daily - 7 x weekly - 2 sets - 10 reps - 5 hold - Supine Shoulder External Rotation in Abduction  - 1 x daily - 7 x weekly - 2 sets - 10 reps - 5 hold - Supine Shoulder Abduction AROM  - 1 x daily - 7 x weekly - 2 sets - 10 reps - 5 hold Access Code: [email or web address] URL: [email or web address] Date: 02/04/2023 Prepared by: Precious Bard  Exercises - Supine Shoulder Flexion AAROM with Dowel  - 1 x daily - 7 x weekly - 2 sets - 10 reps - 5 hold - Supine Shoulder Abduction AAROM with Dowel  - 1 x  daily - 7 x weekly - 2 sets - 10 reps - 5 hold - Supine Shoulder External Rotation with Dowel  - 1 x daily - 7 x weekly - 2 sets - 10 reps - 5 hold - Seated Scapular Retraction  - 1 x daily - 7 x weekly - 2 sets - 10 reps - 5 hold  ASSESSMENT:  CLINICAL IMPRESSION: ***  Patient will benefit from skilled PT services to fully rehabilitate this operative shoulder to allow for full return to PLOF in UE use for ADL, IADL, and fitness/leisure.   OBJECTIVE IMPAIRMENTS: decreased activity tolerance, decreased endurance, decreased knowledge of condition, decreased ROM, decreased strength, increased muscle spasms, impaired flexibility, and improper body mechanics.   ACTIVITY LIMITATIONS: carrying, lifting, bathing, toileting, dressing, self feeding, reach over head, and hygiene/grooming  PARTICIPATION LIMITATIONS: meal prep, cleaning, laundry, driving, shopping, community activity, occupation, and yard work  PERSONAL FACTORS: Age, Behavior pattern, Education, Fitness, Past/current experiences, Profession, Social background, Time since onset of injury/illness/exacerbation, and Transportation are also affecting patient's functional outcome.   REHAB POTENTIAL: Excellent  CLINICAL DECISION MAKING: Stable/uncomplicated  EVALUATION COMPLEXITY: Low   GOALS: Goals reviewed with patient? No  SHORT TERM GOALS: Target date: 04/30/2023    Pt to demonstrate Rt shoulder P/ROM flexion to >120 degrees, ABDCT >90 degrees, ER >45 degrees to improve ability to perform ADL/selfcare.  Baseline: 1/22: flexion:  70, abduction 38, scaption 71 3/4: MET Goal status:MET  2.  Pt to report successful HEP performance and successful adherence to postoperative restrictions.  Baseline: HEP compliant Goal status:MET  3.  Pt to demonstrate 3/5 Rt shoulder MMT to 90 degrees of flexion and 90 degrees of abduction.  Baseline: unable 1/22: not able to due to protocol Goal status: in progress  LONG TERM GOALS: Target date:  05/28/2023    Pt to improve FOTO survey goal by >60 points to indicate a monstrous reduction in difficulty of Rt limb use in a variety of questionable activities.  Baseline: 4 1/22: 44 Goal status: Deferred   2.  Pt to demonstrate 5/5 Rt shoulder flexion, abduction, ER, IR, extension; 5/5 Rt elbow flexion/extension  Baseline: 1/22: deferred due to protocol 3/4: 3/5 strength  4/1: flexion 4/5, extension 4/5 abduction 3/5  Goal status:ongoing   3.  Pt to demonstrate >155 degrees Rt shoulder A/ROM without end-range pain>2/10.  Baseline: 1/22: flexion: 70, abduction 38, scaption 71 3/4: flexion: 115 abduction: 92 scaption:  120 4/1: flexion: 131 abduction: 138; scaption: 150 Goal status: Partially Met  4.  Pt to demonstrate ability to perform gym-type exercises of resistance and aerobic natures without pain exacerbation in shoulder to allow for return to regular exercise at DC.  Baseline: 1/22: not released from protocol yet 3/4: no yet to this point of protocol  Goal status: ongoing   5   Patient will decrease Quick DASH score by > 8 points demonstrating reduced self-reported upper extremity disability. Baseline: 4/1: 37%  Goal status: NEW  PLAN:  PT FREQUENCY: 1-2x/week  PT DURATION: 8 weeks   PLANNED INTERVENTIONS: 97110-Therapeutic exercises, 97530- Therapeutic activity, [id number]- Neuromuscular re-education, 97535- Self Care, 16109- Manual therapy, 97014- Electrical stimulation (unattended), [phone]- Electrical stimulation (manual), Patient/Family education, and Moist heat  PLAN FOR NEXT SESSION: continue per Shoulder protocol  10:41 AM, 05/08/23     Precious Bard, PT 05/08/2023, 10:41 AM

## 2023-05-09 ENCOUNTER — Ambulatory Visit: Payer: 59

## 2023-05-09 DIAGNOSIS — M6281 Muscle weakness (generalized): Secondary | ICD-10-CM | POA: Diagnosis not present

## 2023-05-09 DIAGNOSIS — M25511 Pain in right shoulder: Secondary | ICD-10-CM | POA: Diagnosis not present

## 2023-05-09 DIAGNOSIS — M25611 Stiffness of right shoulder, not elsewhere classified: Secondary | ICD-10-CM | POA: Diagnosis not present

## 2023-05-10 ENCOUNTER — Ambulatory Visit

## 2023-05-14 ENCOUNTER — Ambulatory Visit: Admitting: Obstetrics and Gynecology

## 2023-05-16 ENCOUNTER — Ambulatory Visit: Payer: 59

## 2023-05-17 ENCOUNTER — Ambulatory Visit

## 2023-05-17 DIAGNOSIS — E538 Deficiency of other specified B group vitamins: Secondary | ICD-10-CM | POA: Diagnosis not present

## 2023-05-17 MED ORDER — CYANOCOBALAMIN 1000 MCG/ML IJ SOLN
1000.0000 ug | Freq: Once | INTRAMUSCULAR | Status: AC
  Administered 2023-05-17: 1000 ug via INTRAMUSCULAR

## 2023-05-17 NOTE — Progress Notes (Signed)
 Patient is in office today for a nurse visit for B12 Injection. Patient Injection was given in the  Right deltoid. Patient tolerated injection well.

## 2023-05-21 ENCOUNTER — Ambulatory Visit

## 2023-05-21 DIAGNOSIS — M6281 Muscle weakness (generalized): Secondary | ICD-10-CM

## 2023-05-21 DIAGNOSIS — M25511 Pain in right shoulder: Secondary | ICD-10-CM | POA: Diagnosis not present

## 2023-05-21 DIAGNOSIS — M25611 Stiffness of right shoulder, not elsewhere classified: Secondary | ICD-10-CM

## 2023-05-21 NOTE — Therapy (Signed)
 OUTPATIENT PHYSICAL THERAPY SHOULDER TREATMENT        Patient Name: Shelby Henson MRN: [id number] DOB:January 09, 1971, 53 y.o., female Today's Date: 05/21/2023  END OF SESSION:  PT End of Session - 05/21/23 0759     Visit Number 22    Number of Visits 24    Date for PT Re-Evaluation 05/28/23    Authorization Type Arlin [employer]    Authorization Time Period 01/07/23-04/07/23    Progress Note Due on Visit 10    PT Start Time 0718    PT Stop Time 0759    PT Time Calculation (min) 41 min    Activity Tolerance Patient tolerated treatment well    Behavior During Therapy [facility] for tasks assessed/performed                              Past Medical History:  Diagnosis Date   Anemia    Asthma    well controlled   Colon polyps    Complication of anesthesia    DDD (degenerative disc disease), lumbar    Depression    GERD (gastroesophageal reflux disease)    occ   Iron  deficiency anemia    Migraine    Morbid obesity (HCC)    Osteoarthritis    Osteoarthritis of right hip    PONV (postoperative nausea and vomiting)    with c-section x 1   Right rotator cuff tear 11/2022   Vitamin D  deficiency    Past Surgical History:  Procedure Laterality Date   BREAST CYST ASPIRATION Left    CESAREAN SECTION  1990   CESAREAN SECTION  1993   CESAREAN SECTION  1996   COLONOSCOPY N/A 09/22/2020   Procedure: COLONOSCOPY;  Surgeon: Irby Mannan, MD;  Location: [facility] ENDOSCOPY;  Service: Endoscopy;  Laterality: N/A;   LAPAROSCOPY  2018   LEEP  2005   benign pathology; Dr. Laney Piper   SHOULDER ACROMIOPLASTY Right 01/03/2023   Procedure: RIGHT SHOULDER ACROMIOPLASTY;  Surgeon: Wilhelmenia Harada, MD;  Location: [facility] ORS;  Service: Orthopedics;  Laterality: Right;   SHOULDER ARTHROSCOPY WITH ROTATOR CUFF REPAIR AND OPEN BICEPS TENODESIS Right 01/03/2023   Procedure: RIGHT SHOULDER ARTHROSCOPY WITH ROTATOR CUFF REPAIR AND  BICEPS TENODESIS;  Surgeon: Wilhelmenia Harada, MD;   Location: [facility] ORS;  Service: Orthopedics;  Laterality: Right;   SUPRACERVICAL ABDOMINAL HYSTERECTOMY  2010   leio/AUB; Dr. Laney Piper   TOTAL HIP ARTHROPLASTY Right 11/12/2018   Procedure: TOTAL HIP ARTHROPLASTY ANTERIOR APPROACH;  Surgeon: Jerlyn Moons, MD;  Location: [facility] ORS;  Service: Orthopedics;  Laterality: Right;   Patient Active Problem List   Diagnosis Date Noted   Severe persistent asthma without complication 03/29/2023   Nontraumatic incomplete tear of right rotator cuff 01/03/2023   Right rotator cuff tendonitis 09/11/2022   Iron  deficiency anemia due to dietary causes 08/10/2022   Chronic right shoulder pain 05/11/2022   Morbid obesity (HCC) 01/05/2022   Rotator cuff impingement syndrome, right 09/29/2021   Biceps tendinitis, right 09/29/2021   Meralgia paresthetica of right side 03/29/2021   Psoas tendinitis, right hip 03/29/2021   History of total right hip arthroplasty 03/29/2021   Status post total replacement of right hip 02/19/2021   Meralgia paresthetica of right side 02/19/2021   Polyp of ascending colon 02/19/2021   Polyp of sigmoid colon 02/19/2021   Special screening for malignant neoplasms, colon    Polyp of ascending colon    Polyp of sigmoid  colon    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 03/23/2020   Pain in right hip 01/12/2019   Vitamin D  deficiency 02/25/2018   Chronic pain 10/24/2015   Hot flashes 10/24/2015   Migraine without aura and without status migrainosus, not intractable 10/24/2015   Metabolic syndrome 10/24/2015   Dyslipidemia 10/24/2015   Allergic rhinitis, seasonal 05/17/2015   Chronic insomnia 08/08/2014   DDD (degenerative disc disease), lumbar 08/08/2014   Mood disorder (HCC) 08/08/2014   Gastroesophageal reflux disease without esophagitis 08/08/2014   H/O total hysterectomy 08/08/2014   Irritable bowel syndrome with diarrhea 08/08/2014   Osteoarthritis of right hip 08/08/2014   Adult BMI 30+  08/08/2014   SBO (spina bifida occulta) 08/08/2014    PCP: Arleen Lacer, MD   REFERRING PROVIDER: Wilhelmenia Harada, MD orthopedics   REFERRING DIAG: Rt arthroscopic rotator cuff repair   THERAPY DIAG:  Right shoulder pain, unspecified chronicity  Stiffness of right shoulder, not elsewhere classified  Muscle weakness (generalized)  Rationale for Evaluation and Treatment: Rehabilitation  ONSET DATE: 01/03/23  SUBJECTIVE:                                                                                                                                                                                      SUBJECTIVE STATEMENT: Patient has been compliant with HEP and motivated.   Hand dominance: Right  PERTINENT HISTORY: Shelby Henson is a 53yo F presents to outpatient physical therapy on postop day 4 status post right arthroscopic shoulder debridement of supraspinatus tendon, anterior glenoid labrum, subacromial depression, rotator cuff repair with Dr. Wilhelmenia Harada.  Patient reports more than 1 year of insidious onset shoulder pain previously followed by sports medicine Dr. Augustus Ledger.  PAIN:  Are you having pain? 3/10 right shoulder: Worst pain was 8 out of 10 while sleeping overnight 2 nights ago.  PRECAUTIONS: Patient in sling immobilizer and nonweightbearing x 2 weeks.  Awaiting updates pending follow-up visit with orthopedics.   WEIGHT BEARING RESTRICTIONS: Yes, right upper extremity nonweightbearing  FALLS:  Has patient fallen in last 6 months?  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 PATIENT SURVEYS:  FOTO: 4  SENSATION: WNL  POSTURE: WNL  UPPER EXTREMITY ROM:    ROM  Right eval Left eval  Shoulder flexion 90   Shoulder extension NT   Shoulder abduction NT   Shoulder internal rotation full   Shoulder  external rotation NT   Elbow flexion full   Elbow extension full    Wrist flexion full    Wrist extension full    Wrist ulnar deviation full    Wrist radial deviation full    forearm pronation full    forearm supination full    (Blank rows = not tested)    PALPATION:  Tenderness at the posterior shoulder portal wound while adjusting/donning sling   *PROTOCOL LOCATED IN MEDIA SECTION OF EPIC   TODAY'S TREATMENT:                                                                                                                                         DATE: 05/21/23    Manual therapy: (supine)  Gentle grade 1-2 GH joint mobs (inf/PA/AP) x several min  TherEx: Gentle PROM right shoulder flex, abduction, ER, IR 10x  with prolonged holds Supine : 5lb dumbbells: -flexion 10x; 2 sets -chest press 10x; 2 sets -bicep curl 10x; 2 sets -narrow press 10x; 2 sets  Wall pushup 10x; 2 sets    Sci fit : 3 minutes forward, 3 minutes backwards ; lvl 4     Trigger Point Dry Needling  Subsequent Treatment: Instructions provided previously at initial dry needling treatment.   Patient Verbal Consent Given: Yes Education Handout Provided: Previously Provided Muscles Treated: R bicep Electrical Stimulation Performed: No Treatment Response/Outcome: responded well, no adverse reactions     PATIENT EDUCATION: Education details: See above- extensive education this visit  Person educated: patient  Education method: Research scientist (medical), deliberate practice, positive reinforcement, explicit instruction, establish rules,  Education comprehension: adequate   HOME EXERCISE PROGRAM: Access Code: [email or web address] URL: [email or web address] Date: 04/17/2023 Prepared by: Lizbeth Feijoo  Exercises - Seated Shoulder Flexion Full Range  - 1 x daily -  7 x weekly - 2 sets - 10 reps - 5 hold - Seated Shoulder Abduction  - 1 x daily - 7 x weekly - 2 sets - 10 reps - 5 hold - Seated Shoulder External Rotation  - 1 x daily - 7 x weekly - 2 sets - 10 reps - 5 hold - Seated Scapular Retraction  - 1 x daily - 7 x weekly - 2 sets - 10 reps - 5 hold - Supine Shoulder Flexion with Dowel  - 1 x daily - 7 x weekly - 2 sets - 10 reps - 5 hold - Supine Shoulder External Rotation in Abduction  - 1 x daily - 7 x weekly - 2 sets - 10 reps - 5 hold - Supine Shoulder Abduction AROM  - 1 x daily - 7 x weekly - 2 sets - 10 reps - 5 hold Access Code: [email or web address] URL: [email or web address] Date: 02/04/2023 Prepared by: Myranda Pavone  Exercises - Supine Shoulder Flexion AAROM with Dowel  - 1 x daily - 7 x weekly - 2 sets - 10 reps -  5 hold - Supine Shoulder Abduction AAROM with Dowel  - 1 x daily - 7 x weekly - 2 sets - 10 reps - 5 hold - Supine Shoulder External Rotation with Dowel  - 1 x daily - 7 x weekly - 2 sets - 10 reps - 5 hold - Seated Scapular Retraction  - 1 x daily - 7 x weekly - 2 sets - 10 reps - 5 hold  ASSESSMENT:  CLINICAL IMPRESSION: Patient tolerates progressive exercise this session with dumbbells. Patient is highly motivated throughout session and is able to perform wall pushup for first time this session. Progressive strengthening tolerated well with no pain increase.  Patient will benefit from skilled PT services to fully rehabilitate this operative shoulder to allow for full return to PLOF in UE use for ADL, IADL, and fitness/leisure.   OBJECTIVE IMPAIRMENTS: decreased activity tolerance, decreased endurance, decreased knowledge of condition, decreased ROM, decreased strength, increased muscle spasms, impaired flexibility, and improper body mechanics.   ACTIVITY LIMITATIONS: carrying, lifting, bathing, toileting, dressing, self feeding, reach over head, and hygiene/grooming  PARTICIPATION LIMITATIONS: meal prep, cleaning,  laundry, driving, shopping, community activity, occupation, and yard work  PERSONAL FACTORS: Age, Behavior pattern, Education, Fitness, Past/current experiences, Profession, Social background, Time since onset of injury/illness/exacerbation, and Transportation are also affecting patient's functional outcome.   REHAB POTENTIAL: Excellent  CLINICAL DECISION MAKING: Stable/uncomplicated  EVALUATION COMPLEXITY: Low   GOALS: Goals reviewed with patient? No  SHORT TERM GOALS: Target date: 04/30/2023    Pt to demonstrate Rt shoulder P/ROM flexion to >120 degrees, ABDCT >90 degrees, ER >45 degrees to improve ability to perform ADL/selfcare.  Baseline: 1/22: flexion: 70, abduction 38, scaption 71 3/4: MET Goal status:MET  2.  Pt to report successful HEP performance and successful adherence to postoperative restrictions.  Baseline: HEP compliant Goal status:MET  3.  Pt to demonstrate 3/5 Rt shoulder MMT to 90 degrees of flexion and 90 degrees of abduction.  Baseline: unable 1/22: not able to due to protocol Goal status: in progress  LONG TERM GOALS: Target date: 05/28/2023    Pt to improve FOTO survey goal by >60 points to indicate a monstrous reduction in difficulty of Rt limb use in a variety of questionable activities.  Baseline: 4 1/22: 44 Goal status: Deferred   2.  Pt to demonstrate 5/5 Rt shoulder flexion, abduction, ER, IR, extension; 5/5 Rt elbow flexion/extension  Baseline: 1/22: deferred due to protocol 3/4: 3/5 strength  4/1: flexion 4/5, extension 4/5 abduction 3/5  Goal status:ongoing   3.  Pt to demonstrate >155 degrees Rt shoulder A/ROM without end-range pain>2/10.  Baseline: 1/22: flexion: 70, abduction 38, scaption 71 3/4: flexion: 115 abduction: 92 scaption:  120 4/1: flexion: 131 abduction: 138; scaption: 150 Goal status: Partially Met  4.  Pt to demonstrate ability to perform gym-type exercises of resistance and aerobic natures without pain exacerbation in  shoulder to allow for return to regular exercise at DC.  Baseline: 1/22: not released from protocol yet 3/4: no yet to this point of protocol  Goal status: ongoing   5   Patient will decrease Quick DASH score by > 8 points demonstrating reduced self-reported upper extremity disability. Baseline: 4/1: 37%  Goal status: NEW  PLAN:  PT FREQUENCY: 1-2x/week  PT DURATION: 8 weeks   PLANNED INTERVENTIONS: 97110-Therapeutic exercises, 97530- Therapeutic activity, [id number]- Neuromuscular re-education, 97535- Self Care, 16109- Manual therapy, 97014- Electrical stimulation (unattended), 60454- Electrical stimulation (manual), Patient/Family education, and Moist heat  PLAN FOR NEXT  SESSION: continue per Shoulder protocol  8:15 AM, 05/21/23     Beadie Matsunaga, PT 05/21/2023, 8:15 AM

## 2023-05-23 ENCOUNTER — Encounter: Payer: Self-pay | Admitting: Family Medicine

## 2023-05-24 ENCOUNTER — Other Ambulatory Visit: Payer: Self-pay

## 2023-05-24 NOTE — Progress Notes (Signed)
 Shelby Henson, Krichna, MD   Chief Complaint  Patient presents with   Follow-up    TOC    HPI:      [name] is a 53 y.o. [phone] whose LMP was No LMP recorded. Patient has had a hysterectomy., presents today for trichomonas TOC. Diagnosed on pap smear 04/11/23, treated with flagyl . Partner did tx. Pt never had any sx. Due for TOC today. Had neg blood STD testing 2/25.  UTI sx with neg C&S 3/25 resolved.   Patient Active Problem List   Diagnosis Date Noted   Severe persistent asthma without complication 03/29/2023   Nontraumatic incomplete tear of right rotator cuff 01/03/2023   Right rotator cuff tendonitis 09/11/2022   Iron  deficiency anemia due to dietary causes 08/10/2022   Chronic right shoulder pain 05/11/2022   Morbid obesity (HCC) 01/05/2022   Rotator cuff impingement syndrome, right 09/29/2021   Biceps tendinitis, right 09/29/2021   Meralgia paresthetica of right side 03/29/2021   Psoas tendinitis, right hip 03/29/2021   History of total right hip arthroplasty 03/29/2021   Status post total replacement of right hip 02/19/2021   Meralgia paresthetica of right side 02/19/2021   Polyp of ascending colon 02/19/2021   Polyp of sigmoid colon 02/19/2021   Special screening for malignant neoplasms, colon    Polyp of ascending colon    Polyp of sigmoid colon    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 03/23/2020   Pain in right hip 01/12/2019   Vitamin D  deficiency 02/25/2018   Chronic pain 10/24/2015   Hot flashes 10/24/2015   Migraine without aura and without status migrainosus, not intractable 10/24/2015   Metabolic syndrome 10/24/2015   Dyslipidemia 10/24/2015   Allergic rhinitis, seasonal 05/17/2015   Chronic insomnia 08/08/2014   DDD (degenerative disc disease), lumbar 08/08/2014   Mood disorder (HCC) 08/08/2014   Gastroesophageal reflux disease without esophagitis 08/08/2014   H/O total hysterectomy 08/08/2014   Irritable bowel  syndrome with diarrhea 08/08/2014   Osteoarthritis of right hip 08/08/2014   Adult BMI 30+ 08/08/2014   SBO (spina bifida occulta) 08/08/2014    Past Surgical History:  Procedure Laterality Date   BREAST CYST ASPIRATION Left    CESAREAN SECTION  1990   CESAREAN SECTION  1993   CESAREAN SECTION  1996   COLONOSCOPY N/A 09/22/2020   Procedure: COLONOSCOPY;  Surgeon: Irby Mannan, MD;  Location: [facility] END[facility];  Service: Endoscopy;  Laterality: N/A;   LAPAROSCOPY  2018   LEEP  2005   benign pathology; Dr. Laney Piper   SHOULDER ACROMIOPLASTY Right 01/03/2023   Procedure: RIGHT SHOULDER ACROMIOPLASTY;  Surgeon: Wilhelmenia Harada, MD;  Location: [facility] ORS;  Service: Orthopedics;  Laterality: Right;   SHOULDER ARTHROSCOPY WITH ROTATOR CUFF REPAIR AND OPEN BICEPS TENODESIS Right 01/03/2023   Procedure: RIGHT SHOULDER ARTHROSCOPY WITH ROTATOR CUFF REPAIR AND  BICEPS TENODESIS;  Surgeon: Wilhelmenia Harada, MD;  Location: [facility] OR[facility];  Service: Orthopedics;  Laterality: Right;   SUPRACERVICAL ABDOMINAL HYSTERECTOMY  2010   leio/AUB; Dr. Laney Piper   TOTAL HIP ARTHROPLASTY Right 11/12/2018   Procedure: TOTAL HIP ARTHROPLASTY ANTERIOR APPROACH;  Surgeon: Jerlyn Moons, MD;  Location: [facility] OR[facility];  Service: Orthopedics;  Laterality: Right;    Family History  Problem Relation Age of Onset   Depression Mother    Bipolar disorder Brother    Colon cancer Maternal Grandfather        not sure of age   Breast cancer Neg Hx  Social History   Socioeconomic History   Marital status: Divorced    Spouse name: Not on file   Number of children: 3   Years of education: Not on file   Highest education level: Associate degree: academic program  Occupational History   Not on file  Tobacco Use   Smoking status: Never   Smokeless tobacco: Never  Vaping Use   Vaping status: Never Used  Substance and Sexual Activity   Alcohol use: Yes    Alcohol/week: 0.0 standard drinks of alcohol    Comment:  occasionally   Drug use: No   Sexual activity: Yes    Birth control/protection: Surgical    Comment: Hysterectomy  Other Topics Concern   Not on file  Social History Narrative   Not on file   Social Drivers of Health   Financial Resource Strain: Medium Risk (03/26/2023)   Overall Financial Resource Strain (CARDIA)    Difficulty of Paying Living Expenses: Somewhat hard  Food Insecurity: Patient Declined (03/26/2023)   Hunger Vital Sign    Worried About Running Out of Food in the Last Year: Patient declined    Ran Out of Food in the Last Year: Patient declined  Transportation Needs: No Transportation Needs (03/26/2023)   PRAPARE - Administrator, Civil Service (Medical): No    Lack of Transportation (Non-Medical): No  Physical Activity: Inactive (03/26/2023)   Exercise Vital Sign    Days of Exercise per Week: 0 days    Minutes of Exercise per Session: 90 min  Stress: Stress Concern Present (03/26/2023)   [employer] of Occupational Health - Occupational Stress Questionnaire    Feeling of Stress : To some extent  Social Connections: Socially Isolated (03/26/2023)   Social Connection and Isolation Panel [NHANES]    Frequency of Communication with Friends and Family: More than three times a week    Frequency of Social Gatherings with Friends and Family: Three times a week    Attends Religious Services: Never    Active Member of Clubs or Organizations: No    Attends Banker Meetings: Not on file    Marital Status: Divorced  Intimate Partner Violence: Not At Risk (06/02/2021)   Humiliation, Afraid, Rape, and Kick questionnaire    Fear of Current or Ex-Partner: No    Emotionally Abused: No    Physically Abused: No    Sexually Abused: No    Outpatient Medications Prior to Visit  Medication Sig Dispense Refill   albuterol  (PROVENTIL ) (2.5 MG/3ML) 0.083% nebulizer solution Take 3 mLs (2.5 mg total) by nebulization every 6 (six) hours as needed for wheezing  or shortness of breath 75 mL 1   desvenlafaxine  (PRISTIQ ) 50 MG 24 hr tablet Take 1 tablet (50 mg total) by mouth daily. 90 tablet 1   diclofenac  Sodium (VOLTAREN ) 1 % GEL Apply 2 grams topically 4 (four) times daily. 100 g 1   famotidine  (PEPCID ) 40 MG tablet Take 1 tablet (40 mg total) by mouth daily. 90 tablet 1   Fluticasone -Umeclidin-Vilant (TRELEGY ELLIPTA ) 200-62.5-25 MCG/ACT AEPB Inhale 1 puff into the lungs daily. (Patient taking differently: Inhale 1 puff into the lungs at bedtime.) 60 each 6   gabapentin  (NEURONTIN ) 300 MG capsule Take 3 capsules (900 mg total) by mouth at bedtime. 270 capsule 1   Galcanezumab -gnlm (EMGALITY ) 120 MG/ML SOAJ Inject 1 mL into the skin every 30 (thirty) days. 1 mL 5   lidocaine  (LIDODERM ) 5 % Place 2 patches onto the skin daily.  Remove & Discard patch within 12 hours or as directed by MD 60 patch 2   pantoprazole  (PROTONIX ) 40 MG tablet Take 1 tablet (40 mg total) by mouth daily. (Patient taking differently: Take 40 mg by mouth daily as needed.) 30 tablet 1   prazosin  (MINIPRESS ) 2 MG capsule Take 1 capsule (2 mg total) by mouth at bedtime. 90 capsule 0   traMADol  (ULTRAM ) 50 MG tablet Take 1 tablet (50 mg total) by mouth every 6 (six) hours as needed. 30 tablet 0   Albuterol -Budesonide  (AIRSUPRA ) 90-80 MCG/ACT AERO Inhale 2 puffs into the lungs 4 (four) times daily as needed. 10.7 g 1   fluconazole  (DIFLUCAN ) 150 MG tablet Take 1 tablet (150 mg total) by mouth every other day. 2 tablet 0   metroNIDAZOLE  (FLAGYL ) 500 MG tablet Take 2 tablets (1,000 mg total) by mouth 2 (two) times daily for 1 day. 4 tablet 0   No facility-administered medications prior to visit.      ROS:  Review of Systems  Constitutional:  Negative for fever.  Gastrointestinal:  Negative for blood in stool, constipation, diarrhea, nausea and vomiting.  Genitourinary:  Negative for dyspareunia, dysuria, flank pain, frequency, hematuria, urgency, vaginal bleeding, vaginal discharge  and vaginal pain.  Musculoskeletal:  Negative for back pain.  Skin:  Negative for rash.   BREAST: No symptoms   OBJECTIVE:   Vitals:  BP 129/78   Pulse 71   Ht 4\' 11"  (1.499 m)   Wt 179 lb (81.2 kg)   BMI 36.15 kg/m   Physical Exam Vitals reviewed.  Constitutional:      Appearance: She is well-developed.  Pulmonary:     Effort: Pulmonary effort is normal.  Genitourinary:    General: Normal vulva.     Pubic Area: No rash.      Labia:        Right: No rash, tenderness or lesion.        Left: No rash, tenderness or lesion.      Vagina: Normal. No vaginal discharge, erythema or tenderness.     Cervix: Normal.  Musculoskeletal:        General: Normal range of motion.     Cervical back: Normal range of motion.  Skin:    General: Skin is warm and dry.  Neurological:     General: No focal deficit present.     Mental Status: She is alert and oriented to person, place, and time.  Psychiatric:        Mood and Affect: Mood normal.        Behavior: Behavior normal.        Thought Content: Thought content normal.        Judgment: Judgment normal.      Assessment/Plan: Trichomonal infection - Plan: Cervicovaginal ancillary only; TOC today. Will f/u if abn.   Screening for STD (sexually transmitted disease) - Plan: Cervicovaginal ancillary only    Return if symptoms worsen or fail to improve.  Shaquaya Wuellner B. Keanan Melander, PA-C 05/27/2023 9:11 AM

## 2023-05-27 ENCOUNTER — Encounter: Payer: Self-pay | Admitting: Obstetrics and Gynecology

## 2023-05-27 ENCOUNTER — Other Ambulatory Visit (HOSPITAL_COMMUNITY)
Admission: RE | Admit: 2023-05-27 | Discharge: 2023-05-27 | Disposition: A | Discharge status: Home / Self Care | Source: Ambulatory Visit | Attending: Obstetrics and Gynecology | Admitting: Obstetrics and Gynecology

## 2023-05-27 ENCOUNTER — Ambulatory Visit (INDEPENDENT_AMBULATORY_CARE_PROVIDER_SITE_OTHER): Admitting: Obstetrics and Gynecology

## 2023-05-27 ENCOUNTER — Other Ambulatory Visit: Payer: Self-pay

## 2023-05-27 VITALS — BP 129/78 | HR 71 | Ht 59.0 in | Wt 179.0 lb

## 2023-05-27 DIAGNOSIS — A599 Trichomoniasis, unspecified: Secondary | ICD-10-CM

## 2023-05-27 DIAGNOSIS — Z113 Encounter for screening for infections with a predominantly sexual mode of transmission: Secondary | ICD-10-CM | POA: Diagnosis not present

## 2023-05-27 NOTE — Patient Instructions (Signed)
 I value your feedback and you entrusting [place] with your care. If you get a [place] patient survey, I would appreciate you taking the time to let us know about your experience today. Thank you! ? ? ?

## 2023-05-28 LAB — CERVICOVAGINAL ANCILLARY ONLY
Chlamydia: NEGATIVE
Comment: NEGATIVE
Comment: NEGATIVE
Comment: NORMAL
Neisseria Gonorrhea: NEGATIVE
Trichomonas: NEGATIVE

## 2023-05-31 ENCOUNTER — Encounter (HOSPITAL_BASED_OUTPATIENT_CLINIC_OR_DEPARTMENT_OTHER): Payer: Self-pay | Admitting: Orthopaedic Surgery

## 2023-05-31 ENCOUNTER — Ambulatory Visit (HOSPITAL_BASED_OUTPATIENT_CLINIC_OR_DEPARTMENT_OTHER): Admitting: Orthopaedic Surgery

## 2023-05-31 DIAGNOSIS — M25511 Pain in right shoulder: Secondary | ICD-10-CM | POA: Diagnosis not present

## 2023-05-31 DIAGNOSIS — M75111 Incomplete rotator cuff tear or rupture of right shoulder, not specified as traumatic: Secondary | ICD-10-CM

## 2023-05-31 MED ORDER — TRIAMCINOLONE ACETONIDE 40 MG/ML IJ SUSP
80.0000 mg | INTRAMUSCULAR | Status: AC | PRN
  Administered 2023-05-31: 80 mg via INTRA_ARTICULAR

## 2023-05-31 MED ORDER — LIDOCAINE HCL 1 % IJ SOLN
4.0000 mL | INTRAMUSCULAR | Status: AC | PRN
  Administered 2023-05-31: 4 mL

## 2023-05-31 NOTE — Progress Notes (Signed)
 Post Operative Evaluation     Procedure/Date of Surgery: Right rotator cuff repair with collagen patch augmentation 12/5   Interval History:  . Presents 6 months status post the above procedure.  At this time she is having some pain with overhead motion and limited range of motion.  This has been somewhat frustrating as she does have radiating pain down the side with overhead motion in particular or with laying directly on the side  PMH/PSH/Family History/Social History/Meds/Allergies:    Past Medical History:  Diagnosis Date  . Anemia   . Asthma    well controlled  . Colon polyps   . Complication of anesthesia   . DDD (degenerative disc disease), lumbar   . Depression   . GERD (gastroesophageal reflux disease)    occ  . Iron  deficiency anemia   . Migraine   . Morbid obesity (HCC)   . Osteoarthritis   . Osteoarthritis of right hip   . PONV (postoperative nausea and vomiting)    with c-section x 1  . Right rotator cuff tear 11/2022  . Vitamin D  deficiency    Past Surgical History:  Procedure Laterality Date  . BREAST CYST ASPIRATION Left   . CESAREAN SECTION  1990  . CESAREAN SECTION  1993  . CESAREAN SECTION  1996  . COLONOSCOPY N/A 09/22/2020   Procedure: COLONOSCOPY;  Surgeon: Irby Mannan, MD;  Location: [facility] ENDOSCOPY;  Service: Endoscopy;  Laterality: N/A;  . LAPAROSCOPY  2018  . LEEP  2005   benign pathology; Dr. Laney Piper  . SHOULDER ACROMIOPLASTY Right 01/03/2023   Procedure: RIGHT SHOULDER ACROMIOPLASTY;  Surgeon: Wilhelmenia Harada, MD;  Location: [facility] ORS;  Service: Orthopedics;  Laterality: Right;  . SHOULDER ARTHROSCOPY WITH ROTATOR CUFF REPAIR AND OPEN BICEPS TENODESIS Right 01/03/2023   Procedure: RIGHT SHOULDER ARTHROSCOPY WITH ROTATOR CUFF REPAIR AND  BICEPS TENODESIS;  Surgeon: Wilhelmenia Harada, MD;  Location: [facility] ORS;  Service: Orthopedics;  Laterality: Right;  . SUPRACERVICAL ABDOMINAL HYSTERECTOMY  2010    leio/AUB; Dr. Laney Piper  . TOTAL HIP ARTHROPLASTY Right 11/12/2018   Procedure: TOTAL HIP ARTHROPLASTY ANTERIOR APPROACH;  Surgeon: Jerlyn Moons, MD;  Location: [facility] ORS;  Service: Orthopedics;  Laterality: Right;   Social History   Socioeconomic History  . Marital status: Divorced    Spouse name: Not on file  . Number of children: 3  . Years of education: Not on file  . Highest education level: Associate degree: academic program  Occupational History  . Not on file  Tobacco Use  . Smoking status: Never  . Smokeless tobacco: Never  Vaping Use  . Vaping status: Never Used  Substance and Sexual Activity  . Alcohol use: Yes    Alcohol/week: 0.0 standard drinks of alcohol    Comment: occasionally  . Drug use: No  . Sexual activity: Yes    Birth control/protection: Surgical    Comment: Hysterectomy  Other Topics Concern  . Not on file  Social History Narrative  . Not on file   Social Drivers of Health   Financial Resource Strain: Medium Risk (03/26/2023)   Overall Financial Resource Strain (CARDIA)   . Difficulty of Paying Living Expenses: Somewhat hard  Food Insecurity: Patient Declined (03/26/2023)   Hunger Vital Sign   . Worried About Programme researcher, broadcasting/film/video in the Last  Year: Patient declined   . Ran Out of Food in the Last Year: Patient declined  Transportation Needs: No Transportation Needs (03/26/2023)   PRAPARE - Transportation   . Lack of Transportation (Medical): No   . Lack of Transportation (Non-Medical): No  Physical Activity: Unknown (03/26/2023)   Exercise Vital Sign   . Days of Exercise per Week: 0 days   . Minutes of Exercise per Session: Not on file  Recent Concern: Physical Activity - Inactive (03/26/2023)   Exercise Vital Sign   . Days of Exercise per Week: 0 days   . Minutes of Exercise per Session: 90 min  Stress: Stress Concern Present (03/26/2023)   [employer] of Occupational Health - Occupational Stress Questionnaire   . Feeling of Stress : To  some extent  Social Connections: Socially Isolated (03/26/2023)   Social Connection and Isolation Panel [NHANES]   . Frequency of Communication with Friends and Family: More than three times a week   . Frequency of Social Gatherings with Friends and Family: Three times a week   . Attends Religious Services: Never   . Active Member of Clubs or Organizations: No   . Attends Banker Meetings: Not on file   . Marital Status: Divorced   Family History  Problem Relation Age of Onset  . Depression Mother   . Bipolar disorder Brother   . Colon cancer Maternal Grandfather        not sure of age  . Breast cancer Neg Hx    Allergies  Allergen Reactions  . Celecoxib Hives  . Prednisone      intolerance, makes her angry. Makes her angry  . Topamax  [Topiramate ]     Mood changes  . Zolpidem     Mood changes  . Codeine Rash  . Penicillins Rash    Did it involve swelling of the face/tongue/throat, SOB, or low BP? No Did it involve sudden or severe rash/hives, skin peeling, or any reaction on the inside of your mouth or nose? Yes Did you need to seek medical attention at a hospital or doctor's office? Yes When did it last happen? More than 20 years ago       If all above answers are "NO", may proceed with cephalosporin use.    Current Outpatient Medications  Medication Sig Dispense Refill  . albuterol  (PROVENTIL ) (2.5 MG/3ML) 0.083% nebulizer solution Take 3 mLs (2.5 mg total) by nebulization every 6 (six) hours as needed for wheezing or shortness of breath 75 mL 1  . desvenlafaxine  (PRISTIQ ) 50 MG 24 hr tablet Take 1 tablet (50 mg total) by mouth daily. 90 tablet 1  . diclofenac  Sodium (VOLTAREN ) 1 % GEL Apply 2 grams topically 4 (four) times daily. 100 g 1  . famotidine  (PEPCID ) 40 MG tablet Take 1 tablet (40 mg total) by mouth daily. 90 tablet 1  . Fluticasone -Umeclidin-Vilant (TRELEGY ELLIPTA ) 200-62.5-25 MCG/ACT AEPB Inhale 1 puff into the lungs daily. (Patient taking  differently: Inhale 1 puff into the lungs at bedtime.) 60 each 6  . gabapentin  (NEURONTIN ) 300 MG capsule Take 3 capsules (900 mg total) by mouth at bedtime. 270 capsule 1  . Galcanezumab -gnlm (EMGALITY ) 120 MG/ML SOAJ Inject 1 mL into the skin every 30 (thirty) days. 1 mL 5  . lidocaine  (LIDODERM ) 5 % Place 2 patches onto the skin daily. Remove & Discard patch within 12 hours or as directed by MD 60 patch 2  . pantoprazole  (PROTONIX ) 40 MG tablet Take 1 tablet (40 mg  total) by mouth daily. (Patient taking differently: Take 40 mg by mouth daily as needed.) 30 tablet 1  . prazosin  (MINIPRESS ) 2 MG capsule Take 1 capsule (2 mg total) by mouth at bedtime. 90 capsule 0  . traMADol  (ULTRAM ) 50 MG tablet Take 1 tablet (50 mg total) by mouth every 6 (six) hours as needed. 30 tablet 0   No current facility-administered medications for this visit.   No results found.  Review of Systems:   A ROS was performed including pertinent positives and negatives as documented in the HPI.   Musculoskeletal Exam:    There were no vitals taken for this visit.  Right shoulder incisions are well-appearing without erythema or drainage.  Passive range of motion as well as active range of motion is to 135 degrees in the standing position against gravity.  External rotation at side is 30 degrees 2+ radial pulse.  Distal neurosensory exam is intact.  Sling is well-fitting after adjustment  Imaging:      I personally reviewed and interpreted the radiographs.   Assessment:   6 months status post right shoulder rotator cuff repair with collagen patch augmentation.  At this time she is having some stiffness and I do believe this is correlated with some overhead adhesive capsulitis.  Given this we will plan for an injection of the glenohumeral joint and I will see her back in 1 month for reassessment Plan :    - Right shoulder ultrasound-guided injection provided after verbal consent obtained    Procedure  Note  Patient: Shelby Henson             Date of Birth: 10/22/1970           MRN: [id number]             Visit Date: 05/31/2023  Procedures: Visit Diagnoses: No diagnosis found.  Large Joint Inj: R glenohumeral on 05/31/2023 12:20 PM Indications: pain Details: 22 G 1.5 in needle, ultrasound-guided anterior approach  Arthrogram: No  Medications: 4 mL lidocaine  1 %; 80 mg triamcinolone  acetonide 40 MG/ML Outcome: tolerated well, no immediate complications Procedure, treatment alternatives, risks and benefits explained, specific risks discussed. Consent was given by the patient. Immediately prior to procedure a time out was called to verify the correct patient, procedure, equipment, support staff and site/side marked as required. Patient was prepped and draped in the usual sterile fashion.         I personally saw and evaluated the patient, and participated in the management and treatment plan.  Wilhelmenia Harada, MD Attending Physician, Orthopedic Surgery  This document was dictated using Dragon voice recognition software. A reasonable attempt at proof reading has been made to minimize errors.

## 2023-06-01 DIAGNOSIS — F4322 Adjustment disorder with anxiety: Secondary | ICD-10-CM | POA: Diagnosis not present

## 2023-06-06 ENCOUNTER — Other Ambulatory Visit: Payer: Self-pay | Admitting: Family Medicine

## 2023-06-06 DIAGNOSIS — M545 Low back pain, unspecified: Secondary | ICD-10-CM

## 2023-06-07 ENCOUNTER — Other Ambulatory Visit: Payer: Self-pay | Admitting: Family Medicine

## 2023-06-07 ENCOUNTER — Other Ambulatory Visit: Payer: Self-pay

## 2023-06-07 DIAGNOSIS — M545 Low back pain, unspecified: Secondary | ICD-10-CM

## 2023-06-07 MED FILL — Lidocaine Patch 5%: CUTANEOUS | 30 days supply | Qty: 60 | Fill #0 | Status: AC

## 2023-06-11 NOTE — Therapy (Incomplete)
 OUTPATIENT PHYSICAL THERAPY SHOULDER TREATMENT / RECERT       Patient Name: Shelby Henson MRN: [id number] DOB:1970/10/19, 53 y.o., female Today's Date: 06/11/2023  END OF SESSION:                     Past Medical History:  Diagnosis Date   Anemia    Asthma    well controlled   Colon polyps    Complication of anesthesia    DDD (degenerative disc disease), lumbar    Depression    GERD (gastroesophageal reflux disease)    occ   Iron  deficiency anemia    Migraine    Morbid obesity (HCC)    Osteoarthritis    Osteoarthritis of right hip    PONV (postoperative nausea and vomiting)    with c-section x 1   Right rotator cuff tear 11/2022   Vitamin D  deficiency    Past Surgical History:  Procedure Laterality Date   BREAST CYST ASPIRATION Left    CESAREAN SECTION  1990   CESAREAN SECTION  1993   CESAREAN SECTION  1996   COLONOSCOPY N/A 09/22/2020   Procedure: COLONOSCOPY;  Surgeon: Irby Mannan, MD;  Location: [facility] ENDOSCOPY;  Service: Endoscopy;  Laterality: N/A;   LAPAROSCOPY  2018   LEEP  2005   benign pathology; Dr. Laney Piper   SHOULDER ACROMIOPLASTY Right 01/03/2023   Procedure: RIGHT SHOULDER ACROMIOPLASTY;  Surgeon: Shelby Harada, MD;  Location: [facility] ORS;  Service: Orthopedics;  Laterality: Right;   SHOULDER ARTHROSCOPY WITH ROTATOR CUFF REPAIR AND OPEN BICEPS TENODESIS Right 01/03/2023   Procedure: RIGHT SHOULDER ARTHROSCOPY WITH ROTATOR CUFF REPAIR AND  BICEPS TENODESIS;  Surgeon: Shelby Harada, MD;  Location: [facility] ORS;  Service: Orthopedics;  Laterality: Right;   SUPRACERVICAL ABDOMINAL HYSTERECTOMY  2010   leio/AUB; Dr. Laney Piper   TOTAL HIP ARTHROPLASTY Right 11/12/2018   Procedure: TOTAL HIP ARTHROPLASTY ANTERIOR APPROACH;  Surgeon: Jerlyn Moons, MD;  Location: [facility] ORS;  Service: Orthopedics;  Laterality: Right;   Patient Active Problem List   Diagnosis Date Noted   Severe persistent asthma without complication 03/29/2023    Nontraumatic incomplete tear of right rotator cuff 01/03/2023   Right rotator cuff tendonitis 09/11/2022   Iron  deficiency anemia due to dietary causes 08/10/2022   Chronic right shoulder pain 05/11/2022   Morbid obesity (HCC) 01/05/2022   Rotator cuff impingement syndrome, right 09/29/2021   Biceps tendinitis, right 09/29/2021   Meralgia paresthetica of right side 03/29/2021   Psoas tendinitis, right hip 03/29/2021   History of total right hip arthroplasty 03/29/2021   Status post total replacement of right hip 02/19/2021   Meralgia paresthetica of right side 02/19/2021   Polyp of ascending colon 02/19/2021   Polyp of sigmoid colon 02/19/2021   Special screening for malignant neoplasms, colon    Polyp of ascending colon    Polyp of sigmoid colon    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 03/23/2020   Pain in right hip 01/12/2019   Vitamin D  deficiency 02/25/2018   Chronic pain 10/24/2015   Hot flashes 10/24/2015   Migraine without aura and without status migrainosus, not intractable 10/24/2015   Metabolic syndrome 10/24/2015   Dyslipidemia 10/24/2015   Allergic rhinitis, seasonal 05/17/2015   Chronic insomnia 08/08/2014   DDD (degenerative disc disease), lumbar 08/08/2014   Mood disorder (HCC) 08/08/2014   Gastroesophageal reflux disease without esophagitis 08/08/2014   H/O total hysterectomy 08/08/2014   Irritable bowel syndrome with diarrhea  08/08/2014   Osteoarthritis of right hip 08/08/2014   Adult BMI 30+ 08/08/2014   SBO (spina bifida occulta) 08/08/2014    PCP: Arleen Lacer, MD   REFERRING PROVIDER: Wilhelmenia Harada, MD orthopedics   REFERRING DIAG: Rt arthroscopic rotator cuff repair   THERAPY DIAG:  No diagnosis found.  Rationale for Evaluation and Treatment: Rehabilitation  ONSET DATE: 01/03/23  SUBJECTIVE:                                                                                                                                                                                       SUBJECTIVE STATEMENT: *** Patient returning after multiple week absence due to work demands.   Hand dominance: Right  PERTINENT HISTORY: Shelby Henson is a 53yo F presents to outpatient physical therapy on postop day 4 status post right arthroscopic shoulder debridement of supraspinatus tendon, anterior glenoid labrum, subacromial depression, rotator cuff repair with Dr. Wilhelmenia Henson.  Patient reports more than 1 year of insidious onset shoulder pain previously followed by sports medicine Dr. Augustus Henson.  PAIN:  Are you having pain? 3/10 right shoulder: Worst pain was 8 out of 10 while sleeping overnight 2 nights ago.  PRECAUTIONS: Patient in sling immobilizer and nonweightbearing x 2 weeks.  Awaiting updates pending follow-up visit with orthopedics.   WEIGHT BEARING RESTRICTIONS: Yes, right upper extremity nonweightbearing  FALLS:  Has patient fallen in last 6 months?  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 PATIENT SURVEYS:  FOTO: 4  SENSATION: WNL  POSTURE: WNL  UPPER EXTREMITY ROM:    ROM Right eval Left eval  Shoulder flexion 90   Shoulder extension NT   Shoulder abduction NT   Shoulder internal rotation full   Shoulder external rotation NT   Elbow flexion full   Elbow extension full    Wrist flexion full    Wrist extension full    Wrist ulnar deviation full    Wrist radial deviation full    forearm pronation full    forearm supination full    (Blank rows = not tested)    PALPATION:  Tenderness at the posterior shoulder portal wound while adjusting/donning sling   *PROTOCOL LOCATED IN MEDIA SECTION OF  EPIC   TODAY'S TREATMENT:  DATE: 06/11/23  Physical therapy treatment session today consisted of completing assessment of goals and administration of testing as demonstrated and documented in flow sheet, treatment, and goals section of this note. Addition treatments may be found below.    Manual therapy: (supine)  Gentle grade 1-2 GH joint mobs (inf/PA/AP) x several min  TherEx: Gentle PROM right shoulder flex, abduction, ER, IR 10x  with prolonged holds Supine : 5lb dumbbells: -flexion 10x; 2 sets -chest press 10x; 2 sets -bicep curl 10x; 2 sets -narrow press 10x; 2 sets  Wall pushup 10x; 2 sets    Sci fit : 3 minutes forward, 3 minutes backwards ; lvl 4     Trigger Point Dry Needling  Subsequent Treatment: Instructions provided previously at initial dry needling treatment.   Patient Verbal Consent Given: Yes Education Handout Provided: Previously Provided Muscles Treated: R bicep Electrical Stimulation Performed: No Treatment Response/Outcome: responded well, no adverse reactions     PATIENT EDUCATION: Education details: See above- extensive education this visit  Person educated: patient  Education method: Research scientist (medical), deliberate practice, positive reinforcement, explicit instruction, establish rules,  Education comprehension: adequate   HOME EXERCISE PROGRAM: Access Code: [email or web address] URL: [email or web address] Date: 04/17/2023 Prepared by: Marke Goodwyn  Exercises - Seated Shoulder Flexion Full Range  - 1 x daily - 7 x weekly - 2 sets - 10 reps - 5 hold - Seated Shoulder Abduction  - 1 x daily - 7 x weekly - 2 sets - 10 reps - 5 hold - Seated Shoulder External Rotation  - 1 x daily - 7 x weekly - 2 sets - 10 reps - 5 hold - Seated Scapular Retraction  - 1 x daily - 7 x weekly - 2 sets - 10 reps - 5 hold -  Supine Shoulder Flexion with Dowel  - 1 x daily - 7 x weekly - 2 sets - 10 reps - 5 hold - Supine Shoulder External Rotation in Abduction  - 1 x daily - 7 x weekly - 2 sets - 10 reps - 5 hold - Supine Shoulder Abduction AROM  - 1 x daily - 7 x weekly - 2 sets - 10 reps - 5 hold Access Code: [email or web address] URL: [email or web address] Date: 02/04/2023 Prepared by: Rainy Rothman  Exercises - Supine Shoulder Flexion AAROM with Dowel  - 1 x daily - 7 x weekly - 2 sets - 10 reps - 5 hold - Supine Shoulder Abduction AAROM with Dowel  - 1 x daily - 7 x weekly - 2 sets - 10 reps - 5 hold - Supine Shoulder External Rotation with Dowel  - 1 x daily - 7 x weekly - 2 sets - 10 reps - 5 hold - Seated Scapular Retraction  - 1 x daily - 7 x weekly - 2 sets - 10 reps - 5 hold  ASSESSMENT:  CLINICAL IMPRESSION: ***  Patient will benefit from skilled PT services to fully rehabilitate this operative shoulder to allow for full return to PLOF in UE use for ADL, IADL, and fitness/leisure.   OBJECTIVE IMPAIRMENTS: decreased activity tolerance, decreased endurance, decreased knowledge of condition, decreased ROM, decreased strength, increased muscle spasms, impaired flexibility, and improper body mechanics.   ACTIVITY LIMITATIONS: carrying, lifting, bathing, toileting, dressing, self feeding, reach over head, and hygiene/grooming  PARTICIPATION LIMITATIONS: meal prep, cleaning, laundry, driving, shopping, community activity, occupation, and yard work  PERSONAL FACTORS: Age, Behavior pattern, Education, Fitness, Past/current experiences, Profession, Social background, Time since onset of injury/illness/exacerbation, and Transportation are  also affecting patient's functional outcome.   REHAB POTENTIAL: Excellent  CLINICAL DECISION MAKING: Stable/uncomplicated  EVALUATION COMPLEXITY: Low   GOALS: Goals reviewed with patient? No  SHORT TERM GOALS: Target date: 04/30/2023    Pt to demonstrate Rt  shoulder P/ROM flexion to >120 degrees, ABDCT >90 degrees, ER >45 degrees to improve ability to perform ADL/selfcare.  Baseline: 1/22: flexion: 70, abduction 38, scaption 71 3/4: MET Goal status:MET  2.  Pt to report successful HEP performance and successful adherence to postoperative restrictions.  Baseline: HEP compliant Goal status:MET  3.  Pt to demonstrate 3/5 Rt shoulder MMT to 90 degrees of flexion and 90 degrees of abduction.  Baseline: unable 1/22: not able to due to protocol Goal status: in progress  LONG TERM GOALS: Target date: 05/28/2023    Pt to improve FOTO survey goal by >60 points to indicate a monstrous reduction in difficulty of Rt limb use in a variety of questionable activities.  Baseline: 4 1/22: 44 Goal status: Deferred   2.  Pt to demonstrate 5/5 Rt shoulder flexion, abduction, ER, IR, extension; 5/5 Rt elbow flexion/extension  Baseline: 1/22: deferred due to protocol 3/4: 3/5 strength  4/1: flexion 4/5, extension 4/5 abduction 3/5  Goal status:ongoing   3.  Pt to demonstrate >155 degrees Rt shoulder A/ROM without end-range pain>2/10.  Baseline: 1/22: flexion: 70, abduction 38, scaption 71 3/4: flexion: 115 abduction: 92 scaption:  120 4/1: flexion: 131 abduction: 138; scaption: 150 Goal status: Partially Met  4.  Pt to demonstrate ability to perform gym-type exercises of resistance and aerobic natures without pain exacerbation in shoulder to allow for return to regular exercise at DC.  Baseline: 1/22: not released from protocol yet 3/4: no yet to this point of protocol  Goal status: ongoing   5   Patient will decrease Quick DASH score by > 8 points demonstrating reduced self-reported upper extremity disability. Baseline: 4/1: 37%  Goal status: NEW  PLAN:  PT FREQUENCY: 1-2x/week  PT DURATION: 8 weeks   PLANNED INTERVENTIONS: 97110-Therapeutic exercises, 97530- Therapeutic activity, [id number]- Neuromuscular re-education, 97535- Self Care, [id number]- Manual  therapy, 97014- Electrical stimulation (unattended), [id number]- Electrical stimulation (manual), Patient/Family education, and Moist heat  PLAN FOR NEXT SESSION: continue per Shoulder protocol  5:29 PM, 06/11/23     Adah Stoneberg, PT 06/11/2023, 5:29 PM

## 2023-06-12 ENCOUNTER — Ambulatory Visit: Attending: Orthopaedic Surgery

## 2023-06-12 DIAGNOSIS — M25511 Pain in right shoulder: Secondary | ICD-10-CM | POA: Insufficient documentation

## 2023-06-12 DIAGNOSIS — M6281 Muscle weakness (generalized): Secondary | ICD-10-CM | POA: Insufficient documentation

## 2023-06-12 DIAGNOSIS — M25611 Stiffness of right shoulder, not elsewhere classified: Secondary | ICD-10-CM | POA: Insufficient documentation

## 2023-06-12 NOTE — Therapy (Addendum)
 OUTPATIENT PHYSICAL THERAPY SHOULDER TREATMENT/RE-CERT       Patient Name: Shelby Henson MRN: [id number] DOB:Jun 15, 1970, 53 y.o., female Today's Date: 06/12/2023  END OF SESSION:  PT End of Session - 06/12/23 1315     Visit Number 23    Number of Visits 32    Date for PT Re-Evaluation 07/10/23    Authorization Type [employer]    Authorization Time Period 01/07/23-04/07/23    Progress Note Due on Visit 10    PT Start Time 1316    PT Stop Time 1400    PT Time Calculation (min) 44 min    Activity Tolerance Patient tolerated treatment well    Behavior During Therapy WFL for tasks assessed/performed              Past Medical History:  Diagnosis Date   Anemia    Asthma    well controlled   Colon polyps    Complication of anesthesia    DDD (degenerative disc disease), lumbar    Depression    GERD (gastroesophageal reflux disease)    occ   Iron  deficiency anemia    Migraine    Morbid obesity (HCC)    Osteoarthritis    Osteoarthritis of right hip    PONV (postoperative nausea and vomiting)    with c-section x 1   Right rotator cuff tear 11/2022   Vitamin D  deficiency    Past Surgical History:  Procedure Laterality Date   BREAST CYST ASPIRATION Left    CESAREAN SECTION  1990   CESAREAN SECTION  1993   CESAREAN SECTION  1996   COLONOSCOPY N/A 09/22/2020   Procedure: COLONOSCOPY;  Surgeon: Irby Mannan, MD;  Location: [facility];  Service: Endoscopy;  Laterality: N/A;   LAPAROSCOPY  2018   LEEP  2005   benign pathology; Dr. Laney Piper   SHOULDER ACROMIOPLASTY Right 01/03/2023   Procedure: RIGHT SHOULDER ACROMIOPLASTY;  Surgeon: Wilhelmenia Harada, MD;  Location: [facility];  Service: Orthopedics;  Laterality: Right;   SHOULDER ARTHROSCOPY WITH ROTATOR CUFF REPAIR AND OPEN BICEPS TENODESIS Right 01/03/2023   Procedure: RIGHT SHOULDER ARTHROSCOPY WITH ROTATOR CUFF REPAIR AND  BICEPS TENODESIS;  Surgeon: Wilhelmenia Harada, MD;  Location: [facility];  Service:  Orthopedics;  Laterality: Right;   SUPRACERVICAL ABDOMINAL HYSTERECTOMY  2010   leio/AUB; Dr. Laney Piper   TOTAL HIP ARTHROPLASTY Right 11/12/2018   Procedure: TOTAL HIP ARTHROPLASTY ANTERIOR APPROACH;  Surgeon: Jerlyn Moons, MD;  Location: [facility];  Service: Orthopedics;  Laterality: Right;   Patient Active Problem List   Diagnosis Date Noted   Severe persistent asthma without complication 03/29/2023   Nontraumatic incomplete tear of right rotator cuff 01/03/2023   Right rotator cuff tendonitis 09/11/2022   Iron  deficiency anemia due to dietary causes 08/10/2022   Chronic right shoulder pain 05/11/2022   Morbid obesity (HCC) 01/05/2022   Rotator cuff impingement syndrome, right 09/29/2021   Biceps tendinitis, right 09/29/2021   Meralgia paresthetica of right side 03/29/2021   Psoas tendinitis, right hip 03/29/2021   History of total right hip arthroplasty 03/29/2021   Status post total replacement of right hip 02/19/2021   Meralgia paresthetica of right side 02/19/2021   Polyp of ascending colon 02/19/2021   Polyp of sigmoid colon 02/19/2021   Special screening for malignant neoplasms, colon    Polyp of ascending colon    Polyp of sigmoid colon    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 03/23/2020  Pain in right hip 01/12/2019   Vitamin D  deficiency 02/25/2018   Chronic pain 10/24/2015   Hot flashes 10/24/2015   Migraine without aura and without status migrainosus, not intractable 10/24/2015   Metabolic syndrome 10/24/2015   Dyslipidemia 10/24/2015   Allergic rhinitis, seasonal 05/17/2015   Chronic insomnia 08/08/2014   DDD (degenerative disc disease), lumbar 08/08/2014   Mood disorder (HCC) 08/08/2014   Gastroesophageal reflux disease without esophagitis 08/08/2014   H/O total hysterectomy 08/08/2014   Irritable bowel syndrome with diarrhea 08/08/2014   Osteoarthritis of right hip 08/08/2014   Adult BMI 30+ 08/08/2014   SBO (spina bifida  occulta) 08/08/2014    PCP: Arleen Lacer, MD   REFERRING PROVIDER: Wilhelmenia Harada, MD orthopedics   REFERRING DIAG: Rt arthroscopic rotator cuff repair   THERAPY DIAG:  Right shoulder pain, unspecified chronicity  Stiffness of right shoulder, not elsewhere classified  Muscle weakness (generalized)  Impaired range of motion of right shoulder  Rationale for Evaluation and Treatment: Rehabilitation  ONSET DATE: 01/03/23  SUBJECTIVE:                                                                                                                                                                                      SUBJECTIVE STATEMENT:  Pt reports she feels as though she still needs therapy, but had been making good progress since the injection.  Pt reports the injection has reduced pain and has given her the ability to perform greater ROM with the R shoulder.  Hand dominance: Right  PERTINENT HISTORY: Shelby Henson is a 53yo F presents to outpatient physical therapy on postop day 4 status post right arthroscopic shoulder debridement of supraspinatus tendon, anterior glenoid labrum, subacromial depression, rotator cuff repair with Dr. Wilhelmenia Harada.  Patient reports more than 1 year of insidious onset shoulder pain previously followed by sports medicine Dr. Augustus Ledger.  PAIN:  Are you having pain? 3/10 right shoulder: Worst pain was 8 out of 10 while sleeping overnight 2 nights ago.  PRECAUTIONS: Patient in sling immobilizer and nonweightbearing x 2 weeks.  Awaiting updates pending follow-up visit with orthopedics.   WEIGHT BEARING RESTRICTIONS: Yes, right upper extremity nonweightbearing  FALLS:  Has patient fallen in last 6 months?  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 PATIENT SURVEYS:  FOTO: 4  SENSATION: WNL  POSTURE: WNL  UPPER EXTREMITY ROM:    ROM Right eval Left  eval  Shoulder flexion 90   Shoulder extension NT   Shoulder abduction NT   Shoulder internal rotation full   Shoulder external rotation NT   Elbow flexion full   Elbow extension full    Wrist flexion full    Wrist extension full    Wrist ulnar deviation full    Wrist radial deviation full    forearm pronation full    forearm supination full    (Blank rows = not tested)    PALPATION:  Tenderness at the posterior shoulder portal wound while adjusting/donning sling   *PROTOCOL LOCATED IN MEDIA SECTION OF EPIC   TODAY'S TREATMENT: DATE: 06/12/23   Manual therapy: (supine)  Gentle grade 1-2 GH joint mobs (inf/PA/AP) x several min    TherEx: Gentle PROM right shoulder flex, abduction, ER, IR 10x  with prolonged holds  Seated incline press with wedge in chair, 5# dumbbells, 2x10 each Seated bicep curls, 5# dumbbells, 2x10 each  Standing  shoulder flexion with 5# dumbbells, 2x10 each   Standing push-ups using support bar on wall, 2x10 Standing bent rows with 5# dumbbells, 2x10  Standing wall circles with physioball, 60 sec CW/60 sec CCW R UE only    PATIENT EDUCATION: Education details: See above- extensive education this visit  Person educated: patient  Education method: Research scientist (medical), deliberate practice, positive reinforcement, explicit instruction, establish rules,  Education comprehension: adequate   HOME EXERCISE PROGRAM: Access Code: [email or web address] URL: [email or web address] Date: 04/17/2023 Prepared by: Marina  Moser  Exercises - Seated Shoulder Flexion Full Range  - 1 x daily - 7 x weekly - 2 sets - 10 reps - 5 hold - Seated Shoulder Abduction  - 1 x daily - 7 x weekly - 2 sets - 10 reps - 5 hold - Seated Shoulder External  Rotation  - 1 x daily - 7 x weekly - 2 sets - 10 reps - 5 hold - Seated Scapular Retraction  - 1 x daily - 7 x weekly - 2 sets - 10 reps - 5 hold - Supine Shoulder Flexion with Dowel  - 1 x daily - 7 x weekly - 2 sets - 10 reps - 5 hold - Supine Shoulder External Rotation in Abduction  - 1 x daily - 7 x weekly - 2 sets - 10 reps - 5 hold - Supine Shoulder Abduction AROM  - 1 x daily - 7 x weekly - 2 sets - 10 reps - 5 hold Access Code: [email or web address] URL: [email or web address] Date: 02/04/2023 Prepared by: Marina  Moser  Exercises - Supine Shoulder Flexion AAROM with Dowel  - 1 x daily - 7 x weekly - 2 sets - 10 reps - 5 hold - Supine Shoulder Abduction AAROM with Dowel  - 1 x daily - 7 x weekly - 2 sets - 10 reps - 5 hold - Supine Shoulder External Rotation with Dowel  - 1 x daily - 7 x weekly - 2 sets - 10 reps - 5 hold - Seated Scapular Retraction  - 1 x daily - 7 x weekly - 2 sets - 10 reps - 5 hold  ASSESSMENT:  CLINICAL IMPRESSION:  Pt has made significant progress towards ROM goals and is demonstrating improved functional mobility of the R shoulder.  Pt still lacks adequate strength to return to prior level of functioning, specifically returning to the gym.  Pt will continue to benefit from skilled therapy in order to improve in overall strength necessary for return to gym based exercise program.  Patient's  condition has the potential to improve in response to therapy. Maximum improvement is yet to be obtained. The anticipated improvement is attainable and reasonable in a generally predictable time.   Pt will continue to benefit from skilled therapy to address remaining deficits in order to improve overall QoL and return to PLOF.      OBJECTIVE IMPAIRMENTS: decreased activity tolerance, decreased endurance, decreased knowledge of condition, decreased ROM, decreased strength, increased muscle spasms, impaired flexibility, and improper body mechanics.   ACTIVITY LIMITATIONS:  carrying, lifting, bathing, toileting, dressing, self feeding, reach over head, and hygiene/grooming  PARTICIPATION LIMITATIONS: meal prep, cleaning, laundry, driving, shopping, community activity, occupation, and yard work  PERSONAL FACTORS: Age, Behavior pattern, Education, Fitness, Past/current experiences, Profession, Social background, Time since onset of injury/illness/exacerbation, and Transportation are also affecting patient's functional outcome.   REHAB POTENTIAL: Excellent  CLINICAL DECISION MAKING: Stable/uncomplicated  EVALUATION COMPLEXITY: Low   GOALS: Goals reviewed with patient? No  SHORT TERM GOALS: Target date: 04/30/2023  Pt to demonstrate Rt shoulder P/ROM flexion to >120 degrees, ABDCT >90 degrees, ER >45 degrees to improve ability to perform ADL/selfcare.  Baseline: 1/22: flexion: 70, abduction 38, scaption 71 3/4: MET Goal status:MET  2.  Pt to report successful HEP performance and successful adherence to postoperative restrictions.  Baseline: HEP compliant Goal status:MET  3.  Pt to demonstrate 3/5 Rt shoulder MMT to 90 degrees of flexion and 90 degrees of abduction.  Baseline: unable 1/22: not able to due to protocol Goal status: in progress   LONG TERM GOALS: Target date: 05/28/2023  Pt to improve FOTO survey goal by >60 points to indicate a monstrous reduction in difficulty of Rt limb use in a variety of questionable activities.  Baseline: 4 1/22: 44 Goal status: Deferred   2.  Pt to demonstrate 5/5 Rt shoulder flexion, abduction, ER, IR, extension; 5/5 Rt elbow flexion/extension  Baseline: 1/22: deferred due to protocol 3/4: 3/5 strength  4/1: flexion 4/5, extension 4/5 abduction 3/5 5/14: Flexion 4/5, Extension: 4+/5, Abduction: 3*/5; pt still reporting significant pain in the shoulder flexion/abduction.  Goal status: Ongoing   3.  Pt to demonstrate >155 degrees Rt shoulder A/ROM without end-range pain>2/10.  Baseline: 1/22: flexion: 70, abduction  38, scaption 71 3/4: flexion: 115 abduction: 92 scaption:  120  4/1: flexion: 131 abduction: 138; scaption: 150 5/14: Flexion: 150, Abduction: 170, Scaption: 150; 2/10 pain at the end range "feeling stuck" Goal status: Partially Met  4.  Pt to demonstrate ability to perform gym-type exercises of resistance and aerobic natures without pain exacerbation in shoulder to allow for return to regular exercise at DC.  Baseline: 1/22: not released from protocol yet 3/4: no yet to this point of protocol  Goal status: Ongoing   5   Patient will decrease Quick DASH score by > 8 points demonstrating reduced self-reported upper extremity disability. Baseline: 4/1: 37%  5/14: 25% Goal status: MET, will continue to monitor  PLAN:  PT FREQUENCY: 1-2x/week  PT DURATION: 8 weeks   PLANNED INTERVENTIONS: 97110-Therapeutic exercises, 97530- Therapeutic activity, 97112- Neuromuscular re-education, 97535- Self Care, 16109- Manual therapy, 97014- Electrical stimulation (unattended), [phone]- Electrical stimulation (manual), Patient/Family education, and Moist heat  PLAN FOR NEXT SESSION: continue per Shoulder protocol    Rozanna Corner, PT, DPT Physical Therapist - [facility]  06/12/23, 2:01 PM

## 2023-06-12 NOTE — Addendum Note (Signed)
 Addended by: Alver Austin on: 06/12/2023 05:40 PM   Modules accepted: Orders

## 2023-06-13 ENCOUNTER — Encounter: Payer: Self-pay | Admitting: Family Medicine

## 2023-06-13 ENCOUNTER — Other Ambulatory Visit: Payer: Self-pay

## 2023-06-13 DIAGNOSIS — J4521 Mild intermittent asthma with (acute) exacerbation: Secondary | ICD-10-CM

## 2023-06-13 MED ORDER — ALBUTEROL SULFATE (2.5 MG/3ML) 0.083% IN NEBU
2.5000 mg | INHALATION_SOLUTION | Freq: Four times a day (QID) | RESPIRATORY_TRACT | 0 refills | Status: AC | PRN
Start: 2023-06-13 — End: 2024-06-12
  Filled 2023-06-13: qty 75, 7d supply, fill #0

## 2023-06-13 NOTE — Addendum Note (Signed)
 Addended by: Tsosie Jacquelynn on: 06/13/2023 08:26 AM   Modules accepted: Orders

## 2023-06-19 ENCOUNTER — Ambulatory Visit: Admitting: Nurse Practitioner

## 2023-06-19 ENCOUNTER — Other Ambulatory Visit: Payer: Self-pay

## 2023-06-19 VITALS — BP 124/82 | HR 90 | Temp 97.6°F | Ht 59.0 in | Wt 173.5 lb

## 2023-06-19 DIAGNOSIS — J069 Acute upper respiratory infection, unspecified: Secondary | ICD-10-CM | POA: Diagnosis not present

## 2023-06-19 DIAGNOSIS — J4521 Mild intermittent asthma with (acute) exacerbation: Secondary | ICD-10-CM | POA: Diagnosis not present

## 2023-06-19 MED ORDER — METHYLPREDNISOLONE 4 MG PO TBPK
ORAL_TABLET | ORAL | 0 refills | Status: AC
  Filled 2023-06-19: qty 21, 6d supply, fill #0

## 2023-06-19 MED ORDER — HYDROCOD POLI-CHLORPHE POLI ER 10-8 MG/5ML PO SUER
5.0000 mL | Freq: Two times a day (BID) | ORAL | 0 refills | Status: DC | PRN
Start: 2023-06-19 — End: 2023-06-27
  Filled 2023-06-19: qty 120, 12d supply, fill #0

## 2023-06-19 NOTE — Progress Notes (Signed)
 BP 124/82 (BP Location: Right Arm, Patient Position: Sitting, Cuff Size: Normal)   Pulse 90   Temp 97.6 F (36.4 C) (Oral)   Ht 4\' 11"  (1.499 m)   Wt 173 lb 8 oz (78.7 kg)   SpO2 97%   BMI 35.04 kg/m    Subjective:    Patient ID: Shelby Henson, female    DOB: 1970-09-21, 53 y.o.   MRN: [id number]  HPI: Shelby Henson is a 53 y.o. female  Chief Complaint  Patient presents with   URI    Onset Wednesday 5/15. Nasal congestion, cough, took covid test-neg. Patient says she was coughing/ wheezing 4 days ago but nebulizer helped. Loose stools     Discussed the use of AI scribe software for clinical note transcription with the patient, who gave verbal consent to proceed.  History of Present Illness Shelby Henson is a 53 year old female with asthma who presents with upper respiratory symptoms.  Her symptoms began on May 15th with nasal congestion and cough. She has been experiencing coughing and wheezing for four days, which improved with the use of her albuterol  nebulizer. No fever. A home COVID test was negative. Her symptoms were particularly severe on Friday and Saturday but started to improve by Monday.  Her current medications include Trelegy, which she continues to use, and an albuterol  nebulizer as needed. She has not been using Airsupra  recently, although she still has some available. She has a known allergy to prednisone , which causes mood changes, and has not tried other steroids. She has been using Mucinex, which she found helpful, but ran out on Saturday. Mucinex Nighttime kept her awake, and she has previously experienced similar effects with Benadryl .  She drinks between 64 to 80 ounces of water daily and does not consume juice or soda. 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         06/19/2023   10:46 AM 03/29/2023    9:36 AM 03/05/2023    2:03 PM  Depression screen PHQ 2/9  Decreased Interest 0 0 0  Down, Depressed, Hopeless 0 0 0  PHQ - 2 Score 0 0 0  Altered sleeping 0 0    Tired, decreased energy 0 0   Change in appetite 0 0   Feeling bad or failure about yourself  0 0   Trouble concentrating 0 0   Moving slowly or fidgety/restless 0 0   Suicidal thoughts 0 0   PHQ-9 Score 0 0   Difficult doing work/chores Not difficult at all Not difficult at all     Relevant past medical, surgical, family and social history reviewed and updated as indicated. Interim medical history since our last visit reviewed. Allergies and medications reviewed and updated.  Review of Systems  Ten systems reviewed and is negative except as mentioned in HPI      Objective:      BP 124/82 (BP Location: Right Arm, Patient Position: Sitting, Cuff Size: Normal)   Pulse 90   Temp 97.6 F (36.4 C) (Oral)   Ht 4\' 11"  (1.499 m)   Wt 173 lb 8 oz (78.7 kg)   SpO2 97%   BMI 35.04 kg/m    Wt Readings from Last 3 Encounters:  06/19/23 173 lb 8 oz (78.7 kg)  05/27/23 179 lb (81.2 kg)  04/11/23 174 lb (78.9 kg)    Physical Exam Vitals reviewed.  Constitutional:      Appearance: Normal appearance.  HENT:     Head: Normocephalic.  Cardiovascular:     Rate and Rhythm: Normal rate and regular rhythm.  Pulmonary:     Effort: Pulmonary effort is normal.     Breath sounds: Wheezing present.  Musculoskeletal:        General: Normal range of motion.  Skin:    General: Skin is warm and dry.  Neurological:     General: No focal deficit present.     Mental Status: She is alert and oriented to person, place, and time. Mental status is at baseline.  Psychiatric:        Mood and Affect: Mood normal.        Behavior: Behavior normal.        Thought Content: Thought content normal.        Judgment: Judgment normal.       Results for orders placed or performed in visit on 05/27/23  Cervicovaginal ancillary only   Collection Time: 05/27/23  9:02 AM  Result Value Ref Range   Neisseria Gonorrhea Negative    Chlamydia Negative    Trichomonas Negative    Comment Normal Reference  Range Trichomonas - Negative    Comment Normal Reference Ranger Chlamydia - Negative    Comment      Normal Reference Range Neisseria Gonorrhea - Negative          Assessment & Plan:   Problem List Items Addressed This Visit   None Visit Diagnoses       Asthma in adult, mild intermittent, with acute exacerbation    -  Primary   Relevant Medications   chlorpheniramine-HYDROcodone  (TUSSIONEX) 10-8 MG/5ML   methylPREDNISolone  (MEDROL  DOSEPAK) 4 MG TBPK tablet     Viral upper respiratory tract infection            Assessment and Plan Assessment & Plan Asthma Asthma exacerbation with wheezing and cough, likely triggered by recent upper respiratory symptoms. No fever reported. Wheezing present but not severe. Allergic to prednisone , causing mood changes. Considering trial of Medrol  dose pack to assess tolerance. If Medrol  causes similar mood changes, she will discontinue use. - Prescribe Medrol  dose pack to assess tolerance and response. - Instruct her to report any adverse effects or mood changes with Medrol . - Encourage continued use of albuterol  nebulizer as needed. - Advise hydration with water.  Upper respiratory symptoms Upper respiratory symptoms including nasal congestion and cough since May 15. Negative home COVID test. Symptoms improving with Mucinex, but she ran out of medication. No fever or body aches reported. Differential includes viral infection, but unlikely to change management. - Recommend continued use of Mucinex for symptom relief. - Offer flu and COVID test if symptoms worsen or if she desires. - Advise monitoring symptoms and reporting if fever develops or if symptoms persist. - Consider chest x-ray if symptoms do not improve or if fever develops to rule out pneumonia.  Pulmonology follow-up Overdue for follow-up with pulmonology. Current medications include Trelegy and albuterol  nebulizer. Not using Airsupra  recently. - Recommend contacting pulmonology to  schedule a follow-up appointment.        Follow up plan: Return if symptoms worsen or fail to improve.

## 2023-06-26 ENCOUNTER — Ambulatory Visit (HOSPITAL_BASED_OUTPATIENT_CLINIC_OR_DEPARTMENT_OTHER): Payer: 59 | Admitting: Orthopaedic Surgery

## 2023-06-27 ENCOUNTER — Other Ambulatory Visit: Payer: Self-pay | Admitting: Nurse Practitioner

## 2023-06-27 ENCOUNTER — Encounter: Payer: Self-pay | Admitting: Nurse Practitioner

## 2023-06-27 ENCOUNTER — Other Ambulatory Visit: Payer: Self-pay

## 2023-06-27 DIAGNOSIS — J4521 Mild intermittent asthma with (acute) exacerbation: Secondary | ICD-10-CM

## 2023-06-27 MED ORDER — HYDROCOD POLI-CHLORPHE POLI ER 10-8 MG/5ML PO SUER
5.0000 mL | Freq: Two times a day (BID) | ORAL | 0 refills | Status: DC | PRN
Start: 2023-06-27 — End: 2023-10-23
  Filled 2023-06-27: qty 120, 12d supply, fill #0
  Filled 2023-06-28: qty 115, 12d supply, fill #0

## 2023-06-28 ENCOUNTER — Ambulatory Visit (HOSPITAL_BASED_OUTPATIENT_CLINIC_OR_DEPARTMENT_OTHER): Admitting: Orthopaedic Surgery

## 2023-06-28 ENCOUNTER — Other Ambulatory Visit: Payer: Self-pay

## 2023-07-03 ENCOUNTER — Other Ambulatory Visit: Payer: Self-pay

## 2023-07-03 ENCOUNTER — Other Ambulatory Visit: Payer: Self-pay | Admitting: Family Medicine

## 2023-07-03 DIAGNOSIS — J4521 Mild intermittent asthma with (acute) exacerbation: Secondary | ICD-10-CM

## 2023-07-03 NOTE — Therapy (Signed)
 OUTPATIENT PHYSICAL THERAPY SHOULDER TREATMENT       Patient Name: Shelby Henson MRN: [id number] DOB:Oct 26, 1970, 53 y.o., female Today's Date: 07/04/2023  END OF SESSION:  PT End of Session - 07/04/23 0704     Visit Number 24    Number of Visits 32    Date for PT Re-Evaluation 07/10/23    Authorization Type [employer]    Authorization Time Period 01/07/23-04/07/23    Progress Note Due on Visit 10    PT Start Time 0705    PT Stop Time 0750    PT Time Calculation (min) 45 min    Activity Tolerance Patient tolerated treatment well    Behavior During Therapy [facility] for tasks assessed/performed               Past Medical History:  Diagnosis Date   Anemia    Asthma    well controlled   Colon polyps    Complication of anesthesia    DDD (degenerative disc disease), lumbar    Depression    GERD (gastroesophageal reflux disease)    occ   Iron  deficiency anemia    Migraine    Morbid obesity (HCC)    Osteoarthritis    Osteoarthritis of right hip    PONV (postoperative nausea and vomiting)    with c-section x 1   Right rotator cuff tear 11/2022   Vitamin D  deficiency    Past Surgical History:  Procedure Laterality Date   BREAST CYST ASPIRATION Left    CESAREAN SECTION  1990   CESAREAN SECTION  1993   CESAREAN SECTION  1996   COLONOSCOPY N/A 09/22/2020   Procedure: COLONOSCOPY;  Surgeon: Irby Mannan, MD;  Location: [facility] ENDOSCOPY;  Service: Endoscopy;  Laterality: N/A;   LAPAROSCOPY  2018   LEEP  2005   benign pathology; Dr. Laney Piper   SHOULDER ACROMIOPLASTY Right 01/03/2023   Procedure: RIGHT SHOULDER ACROMIOPLASTY;  Surgeon: Wilhelmenia Harada, MD;  Location: [facility] ORS;  Service: Orthopedics;  Laterality: Right;   SHOULDER ARTHROSCOPY WITH ROTATOR CUFF REPAIR AND OPEN BICEPS TENODESIS Right 01/03/2023   Procedure: RIGHT SHOULDER ARTHROSCOPY WITH ROTATOR CUFF REPAIR AND  BICEPS TENODESIS;  Surgeon: Wilhelmenia Harada, MD;  Location: [facility] ORS;  Service:  Orthopedics;  Laterality: Right;   SUPRACERVICAL ABDOMINAL HYSTERECTOMY  2010   leio/AUB; Dr. Laney Piper   TOTAL HIP ARTHROPLASTY Right 11/12/2018   Procedure: TOTAL HIP ARTHROPLASTY ANTERIOR APPROACH;  Surgeon: Jerlyn Moons, MD;  Location: [facility] ORS;  Service: Orthopedics;  Laterality: Right;   Patient Active Problem List   Diagnosis Date Noted   Severe persistent asthma without complication 03/29/2023   Nontraumatic incomplete tear of right rotator cuff 01/03/2023   Right rotator cuff tendonitis 09/11/2022   Iron  deficiency anemia due to dietary causes 08/10/2022   Chronic right shoulder pain 05/11/2022   Morbid obesity (HCC) 01/05/2022   Rotator cuff impingement syndrome, right 09/29/2021   Biceps tendinitis, right 09/29/2021   Meralgia paresthetica of right side 03/29/2021   Psoas tendinitis, right hip 03/29/2021   History of total right hip arthroplasty 03/29/2021   Status post total replacement of right hip 02/19/2021   Meralgia paresthetica of right side 02/19/2021   Polyp of ascending colon 02/19/2021   Polyp of sigmoid colon 02/19/2021   Special screening for malignant neoplasms, colon    Polyp of ascending colon    Polyp of sigmoid colon    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 03/23/2020  Pain in right hip 01/12/2019   Vitamin D  deficiency 02/25/2018   Chronic pain 10/24/2015   Hot flashes 10/24/2015   Migraine without aura and without status migrainosus, not intractable 10/24/2015   Metabolic syndrome 10/24/2015   Dyslipidemia 10/24/2015   Allergic rhinitis, seasonal 05/17/2015   Chronic insomnia 08/08/2014   DDD (degenerative disc disease), lumbar 08/08/2014   Mood disorder (HCC) 08/08/2014   Gastroesophageal reflux disease without esophagitis 08/08/2014   H/O total hysterectomy 08/08/2014   Irritable bowel syndrome with diarrhea 08/08/2014   Osteoarthritis of right hip 08/08/2014   Adult BMI 30+ 08/08/2014   SBO (spina bifida  occulta) 08/08/2014    PCP: Arleen Lacer, MD   REFERRING PROVIDER: Wilhelmenia Harada, MD orthopedics   REFERRING DIAG: Rt arthroscopic rotator cuff repair   THERAPY DIAG:  Right shoulder pain, unspecified chronicity  Stiffness of right shoulder, not elsewhere classified  Muscle weakness (generalized)  Rationale for Evaluation and Treatment: Rehabilitation  ONSET DATE: 01/03/23  SUBJECTIVE:                                                                                                                                                                                      SUBJECTIVE STATEMENT:  Patient to see surgeon next week.  Hand dominance: Right  PERTINENT HISTORY: Shelby Henson is a 53yo F presents to outpatient physical therapy on postop day 4 status post right arthroscopic shoulder debridement of supraspinatus tendon, anterior glenoid labrum, subacromial depression, rotator cuff repair with Dr. Wilhelmenia Harada.  Patient reports more than 1 year of insidious onset shoulder pain previously followed by sports medicine Dr. Augustus Ledger.  PAIN:  Are you having pain? 3/10 right shoulder: Worst pain was 8 out of 10 while sleeping overnight 2 nights ago.  PRECAUTIONS: Patient in sling immobilizer and nonweightbearing x 2 weeks.  Awaiting updates pending follow-up visit with orthopedics.   WEIGHT BEARING RESTRICTIONS: Yes, right upper extremity nonweightbearing  FALLS:  Has patient fallen in last 6 months?  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 PATIENT SURVEYS:  FOTO: 4  SENSATION: WNL  POSTURE: WNL  UPPER EXTREMITY ROM:    ROM Right eval Left eval  Shoulder flexion 90    Shoulder extension NT   Shoulder abduction NT   Shoulder internal rotation full   Shoulder external rotation NT   Elbow flexion full   Elbow extension full    Wrist flexion full  Wrist extension full    Wrist ulnar deviation full    Wrist radial deviation full    forearm pronation full    forearm supination full    (Blank rows = not tested)    PALPATION:  Tenderness at the posterior shoulder portal wound while adjusting/donning sling   *PROTOCOL LOCATED IN MEDIA SECTION OF EPIC   TODAY'S TREATMENT: DATE: 07/04/23   Manual therapy: (supine)  Gentle grade 1-2 GH joint mobs (inf/PA/AP) x several min    TherEx: Gentle PROM right shoulder flex, abduction, ER, IR 10x  with prolonged holds Supine bench press 10x; 2 sets with 5# DB Supine overhead flexion 10x;  #5;  Seated bicep curls, 5# dumbbells, 2x10 each  Seated shoulder abductionwith 5# dumbbells, 2x10 each   Standing push-ups using support bar on wall, 2x10 Seated bent rows with 5# dumbbells, 2x10  Standing wall circles with rainbow ball , 60 sec CW/60 sec CCW R UE only  SciFit: forward 2.5 minutes, backwards 2.5 minutes   PATIENT EDUCATION: Education details: See above- extensive education this visit  Person educated: patient  Education method: Research scientist (medical), deliberate practice, positive reinforcement, explicit instruction, establish rules,  Education comprehension: adequate   HOME EXERCISE PROGRAM: Access Code: [email or web address] URL: [email or web address] Date: 04/17/2023 Prepared by: Tzvi Economou  Exercises - Seated Shoulder Flexion Full Range  - 1 x daily - 7 x weekly - 2 sets - 10 reps - 5 hold - Seated Shoulder Abduction  - 1 x daily - 7 x weekly - 2 sets - 10 reps - 5 hold - Seated Shoulder External Rotation  - 1 x daily - 7 x weekly - 2 sets - 10 reps - 5 hold - Seated Scapular Retraction  - 1 x daily - 7 x weekly - 2 sets - 10 reps - 5 hold - Supine Shoulder Flexion with Dowel   - 1 x daily - 7 x weekly - 2 sets - 10 reps - 5 hold - Supine Shoulder External Rotation in Abduction  - 1 x daily - 7 x weekly - 2 sets - 10 reps - 5 hold - Supine Shoulder Abduction AROM  - 1 x daily - 7 x weekly - 2 sets - 10 reps - 5 hold Access Code: [email or web address] URL: [email or web address] Date: 02/04/2023 Prepared by: Candela Krul  Exercises - Supine Shoulder Flexion AAROM with Dowel  - 1 x daily - 7 x weekly - 2 sets - 10 reps - 5 hold - Supine Shoulder Abduction AAROM with Dowel  - 1 x daily - 7 x weekly - 2 sets - 10 reps - 5 hold - Supine Shoulder External Rotation with Dowel  - 1 x daily - 7 x weekly - 2 sets - 10 reps - 5 hold - Seated Scapular Retraction  - 1 x daily - 7 x weekly - 2 sets - 10 reps - 5 hold  ASSESSMENT:  CLINICAL IMPRESSION:  Patient to see physician next week, her ROM is within functional range at this time and is progressing with functional strength. Education on need for return to gentle gym program for strengthening.    Pt will continue to benefit from skilled therapy to address remaining deficits in order to improve overall QoL and return to PLOF.      OBJECTIVE IMPAIRMENTS: decreased activity tolerance, decreased endurance, decreased knowledge of condition, decreased ROM, decreased strength, increased muscle spasms, impaired flexibility, and improper body mechanics.   ACTIVITY LIMITATIONS: carrying, lifting, bathing, toileting, dressing, self feeding,  reach over head, and hygiene/grooming  PARTICIPATION LIMITATIONS: meal prep, cleaning, laundry, driving, shopping, community activity, occupation, and yard work  PERSONAL FACTORS: Age, Behavior pattern, Education, Fitness, Past/current experiences, Profession, Social background, Time since onset of injury/illness/exacerbation, and Transportation are also affecting patient's functional outcome.   REHAB POTENTIAL: Excellent  CLINICAL DECISION MAKING: Stable/uncomplicated  EVALUATION  COMPLEXITY: Low   GOALS: Goals reviewed with patient? No  SHORT TERM GOALS: Target date: 04/30/2023  Pt to demonstrate Rt shoulder P/ROM flexion to >120 degrees, ABDCT >90 degrees, ER >45 degrees to improve ability to perform ADL/selfcare.  Baseline: 1/22: flexion: 70, abduction 38, scaption 71 3/4: MET Goal status:MET  2.  Pt to report successful HEP performance and successful adherence to postoperative restrictions.  Baseline: HEP compliant Goal status:MET  3.  Pt to demonstrate 3/5 Rt shoulder MMT to 90 degrees of flexion and 90 degrees of abduction.  Baseline: unable 1/22: not able to due to protocol Goal status: in progress   LONG TERM GOALS: Target date: 05/28/2023  Pt to improve FOTO survey goal by >60 points to indicate a monstrous reduction in difficulty of Rt limb use in a variety of questionable activities.  Baseline: 4 1/22: 44 Goal status: Deferred   2.  Pt to demonstrate 5/5 Rt shoulder flexion, abduction, ER, IR, extension; 5/5 Rt elbow flexion/extension  Baseline: 1/22: deferred due to protocol 3/4: 3/5 strength  4/1: flexion 4/5, extension 4/5 abduction 3/5 5/14: Flexion 4/5, Extension: 4+/5, Abduction: 3*/5; pt still reporting significant pain in the shoulder flexion/abduction.  Goal status: Ongoing   3.  Pt to demonstrate >155 degrees Rt shoulder A/ROM without end-range pain>2/10.  Baseline: 1/22: flexion: 70, abduction 38, scaption 71 3/4: flexion: 115 abduction: 92 scaption:  120  4/1: flexion: 131 abduction: 138; scaption: 150 5/14: Flexion: 150, Abduction: 170, Scaption: 150; 2/10 pain at the end range "feeling stuck" Goal status: Partially Met  4.  Pt to demonstrate ability to perform gym-type exercises of resistance and aerobic natures without pain exacerbation in shoulder to allow for return to regular exercise at DC.  Baseline: 1/22: not released from protocol yet 3/4: no yet to this point of protocol  Goal status: Ongoing   5   Patient will decrease  Quick DASH score by > 8 points demonstrating reduced self-reported upper extremity disability. Baseline: 4/1: 37%  5/14: 25% Goal status: MET, will continue to monitor  PLAN:  PT FREQUENCY: 1-2x/week  PT DURATION: 8 weeks   PLANNED INTERVENTIONS: 97110-Therapeutic exercises, 97530- Therapeutic activity, 97112- Neuromuscular re-education, 97535- Self Care, 16109- Manual therapy, 97014- Electrical stimulation (unattended), [phone]- Electrical stimulation (manual), Patient/Family education, and Moist heat  PLAN FOR NEXT SESSION: continue per Shoulder protocol   Kendra Woolford  Brain Cahill, PT, DPT Physical Therapist - [facility]  Outpatient Physical Therapy- Main Campus [phone]    07/04/23, 7:51 AM

## 2023-07-04 ENCOUNTER — Ambulatory Visit: Attending: Orthopaedic Surgery

## 2023-07-04 DIAGNOSIS — M25511 Pain in right shoulder: Secondary | ICD-10-CM | POA: Diagnosis present

## 2023-07-04 DIAGNOSIS — M25611 Stiffness of right shoulder, not elsewhere classified: Secondary | ICD-10-CM | POA: Insufficient documentation

## 2023-07-04 DIAGNOSIS — M6281 Muscle weakness (generalized): Secondary | ICD-10-CM | POA: Insufficient documentation

## 2023-07-06 DIAGNOSIS — F4322 Adjustment disorder with anxiety: Secondary | ICD-10-CM | POA: Diagnosis not present

## 2023-07-08 NOTE — Therapy (Signed)
 OUTPATIENT PHYSICAL THERAPY SHOULDER TREATMENT       Patient Name: Shelby Henson MRN: [id number] DOB:[birth year], 53 y.o., female Today's Date: 07/08/2023  END OF SESSION:      Past Medical History:  Diagnosis Date   Anemia    Asthma    well controlled   Colon polyps    Complication of anesthesia    DDD (degenerative disc disease), lumbar    Depression    GERD (gastroesophageal reflux disease)    occ   Iron  deficiency anemia    Migraine    Morbid obesity (HCC)    Osteoarthritis    Osteoarthritis of right hip    PONV (postoperative nausea and vomiting)    with c-section x 1   Right rotator cuff tear 11/2022   Vitamin D  deficiency    Past Surgical History:  Procedure Laterality Date   BREAST CYST ASPIRATION Left    CESAREAN SECTION  1990   CESAREAN SECTION  1993   CESAREAN SECTION  1996   COLONOSCOPY N/A 09/22/2020   Procedure: COLONOSCOPY;  Surgeon: Irby Mannan, MD;  Location: [facility] END[facility]COPY;  Service: Endoscopy;  Laterality: N/A;   LAPAROSCOPY  2018   LEEP  2005   benign pathology; Dr. Laney Piper   SHOULDER ACROMIOPLASTY Right 01/03/2023   Procedure: RIGHT SHOULDER ACROMIOPLASTY;  Surgeon: Wilhelmenia Harada, MD;  Location: [facility] ORS;  Service: Orthopedics;  Laterality: Right;   SHOULDER ARTHROSCOPY WITH ROTATOR CUFF REPAIR AND OPEN BICEPS TENODESIS Right 01/03/2023   Procedure: RIGHT SHOULDER ARTHROSCOPY WITH ROTATOR CUFF REPAIR AND  BICEPS TENODESIS;  Surgeon: Wilhelmenia Harada, MD;  Location: [facility] ORS;  Service: Orthopedics;  Laterality: Right;   SUPRACERVICAL ABDOMINAL HYSTERECTOMY  2010   leio/AUB; Dr. Laney Piper   TOTAL HIP ARTHROPLASTY Right 11/12/2018   Procedure: TOTAL HIP ARTHROPLASTY ANTERIOR APPROACH;  Surgeon: Jerlyn Moons, MD;  Location: [facility] ORS;  Service: Orthopedics;  Laterality: Right;   Patient Active Problem List   Diagnosis Date Noted   Severe persistent asthma without complication 03/29/2023   Nontraumatic incomplete tear of right rotator  cuff 01/03/2023   Right rotator cuff tendonitis 09/11/2022   Iron  deficiency anemia due to dietary causes 08/10/2022   Chronic right shoulder pain 05/11/2022   Morbid obesity (HCC) 01/05/2022   Rotator cuff impingement syndrome, right 09/29/2021   Biceps tendinitis, right 09/29/2021   Meralgia paresthetica of right side 03/29/2021   Psoas tendinitis, right hip 03/29/2021   History of total right hip arthroplasty 03/29/2021   Status post total replacement of right hip 02/19/2021   Meralgia paresthetica of right side 02/19/2021   Polyp of ascending colon 02/19/2021   Polyp of sigmoid colon 02/19/2021   Special screening for malignant neoplasms, colon    Polyp of ascending colon    Polyp of sigmoid colon    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 03/23/2020   Pain in right hip 01/12/2019   Vitamin D  deficiency 02/25/2018   Chronic pain 10/24/2015   Hot flashes 10/24/2015   Migraine without aura and without status migrainosus, not intractable 10/24/2015   Metabolic syndrome 10/24/2015   Dyslipidemia 10/24/2015   Allergic rhinitis, seasonal 05/17/2015   Chronic insomnia 08/08/2014   DDD (degenerative disc disease), lumbar 08/08/2014   Mood disorder (HCC) 08/08/2014   Gastroesophageal reflux disease without esophagitis 08/08/2014   H/O total hysterectomy 08/08/2014   Irritable bowel syndrome with diarrhea 08/08/2014   Osteoarthritis of right hip 08/08/2014   Adult BMI 30+ 08/08/2014   SBO (  spina bifida occulta) 08/08/2014    PCP: Arleen Lacer, MD   REFERRING PROVIDER: Wilhelmenia Harada, MD orthopedics   REFERRING DIAG: Rt arthroscopic rotator cuff repair   THERAPY DIAG:  No diagnosis found.  Rationale for Evaluation and Treatment: Rehabilitation  ONSET DATE: 01/03/23  SUBJECTIVE:                                                                                                                                                                                       SUBJECTIVE STATEMENT:  *** Hand dominance: Right  PERTINENT HISTORY: Shelby Henson is a 53yo F presents to outpatient physical therapy on postop day 4 status post right arthroscopic shoulder debridement of supraspinatus tendon, anterior glenoid labrum, subacromial depression, rotator cuff repair with Dr. Wilhelmenia Harada.  Patient reports more than 1 year of insidious onset shoulder pain previously followed by sports medicine Dr. Augustus Ledger.  PAIN:  Are you having pain? 3/10 right shoulder: Worst pain was 8 out of 10 while sleeping overnight 2 nights ago.  PRECAUTIONS: Patient in sling immobilizer and nonweightbearing x 2 weeks.  Awaiting updates pending follow-up visit with orthopedics.   WEIGHT BEARING RESTRICTIONS: Yes, right upper extremity nonweightbearing  FALLS:  Has patient fallen in last 6 months?  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 PATIENT SURVEYS:  FOTO: 4  SENSATION: WNL  POSTURE: WNL  UPPER EXTREMITY ROM:    ROM Right eval Left eval  Shoulder flexion 90   Shoulder extension NT   Shoulder abduction NT   Shoulder internal rotation full   Shoulder external rotation NT   Elbow flexion full   Elbow extension full    Wrist flexion full    Wrist extension full    Wrist ulnar deviation full    Wrist radial deviation full    forearm pronation full    forearm supination full    (Blank rows = not tested)    PALPATION:  Tenderness at the posterior shoulder portal wound while adjusting/donning sling   *PROTOCOL LOCATED IN MEDIA SECTION OF EPIC   TODAY'S TREATMENT: DATE: 07/08/23   Manual therapy: (supine)  Gentle grade 1-2 GH joint mobs (inf/PA/AP) x  several min    TherEx: Gentle PROM right shoulder flex, abduction, ER, IR 10x  with prolonged holds Supine bench press 10x; 2 sets with 5# DB Supine overhead flexion 10x;  #5;  Seated bicep curls, 5# dumbbells, 2x10  each  Seated shoulder abductionwith 5# dumbbells, 2x10 each   Standing push-ups using support bar on wall, 2x10 Seated bent rows with 5# dumbbells, 2x10  Standing wall circles with rainbow ball , 60 sec CW/60 sec CCW R UE only  SciFit: forward 2.5 minutes, backwards 2.5 minutes   PATIENT EDUCATION: Education details: See above- extensive education this visit  Person educated: patient  Education method: Research scientist (medical), deliberate practice, positive reinforcement, explicit instruction, establish rules,  Education comprehension: adequate   HOME EXERCISE PROGRAM: Access Code: [email or web address] URL: [email or web address] Date: 04/17/2023 Prepared by: Jasara Corrigan  Exercises - Seated Shoulder Flexion Full Range  - 1 x daily - 7 x weekly - 2 sets - 10 reps - 5 hold - Seated Shoulder Abduction  - 1 x daily - 7 x weekly - 2 sets - 10 reps - 5 hold - Seated Shoulder External Rotation  - 1 x daily - 7 x weekly - 2 sets - 10 reps - 5 hold - Seated Scapular Retraction  - 1 x daily - 7 x weekly - 2 sets - 10 reps - 5 hold - Supine Shoulder Flexion with Dowel  - 1 x daily - 7 x weekly - 2 sets - 10 reps - 5 hold - Supine Shoulder External Rotation in Abduction  - 1 x daily - 7 x weekly - 2 sets - 10 reps - 5 hold - Supine Shoulder Abduction AROM  - 1 x daily - 7 x weekly - 2 sets - 10 reps - 5 hold Access Code: [email or web address] URL: [email or web address] Date: 02/04/2023 Prepared by: Consuelo Suthers  Exercises - Supine Shoulder Flexion AAROM with Dowel  - 1 x daily - 7 x weekly - 2 sets - 10 reps - 5 hold - Supine Shoulder Abduction AAROM with Dowel  - 1 x daily - 7 x weekly - 2 sets - 10 reps - 5 hold - Supine Shoulder External Rotation with Dowel  - 1 x  daily - 7 x weekly - 2 sets - 10 reps - 5 hold - Seated Scapular Retraction  - 1 x daily - 7 x weekly - 2 sets - 10 reps - 5 hold  ASSESSMENT:  CLINICAL IMPRESSION: ***    Pt will continue to benefit from skilled therapy to address remaining deficits in order to improve overall QoL and return to PLOF.      OBJECTIVE IMPAIRMENTS: decreased activity tolerance, decreased endurance, decreased knowledge of condition, decreased ROM, decreased strength, increased muscle spasms, impaired flexibility, and improper body mechanics.   ACTIVITY LIMITATIONS: carrying, lifting, bathing, toileting, dressing, self feeding, reach over head, and hygiene/grooming  PARTICIPATION LIMITATIONS: meal prep, cleaning, laundry, driving, shopping, community activity, occupation, and yard work  PERSONAL FACTORS: Age, Behavior pattern, Education, Fitness, Past/current experiences, Profession, Social background, Time since onset of injury/illness/exacerbation, and Transportation are also affecting patient's functional outcome.   REHAB POTENTIAL: Excellent  CLINICAL DECISION MAKING: Stable/uncomplicated  EVALUATION COMPLEXITY: Low   GOALS: Goals reviewed with patient? No  SHORT TERM GOALS: Target date: 04/30/2023  Pt to demonstrate Rt shoulder P/ROM flexion to >120 degrees, ABDCT >90 degrees, ER >45 degrees to improve ability to perform ADL/selfcare.  Baseline: 1/22: flexion: 70, abduction 38, scaption 71 3/4: MET Goal status:MET  2.  Pt to report successful HEP performance and successful adherence to postoperative restrictions.  Baseline: HEP compliant Goal status:MET  3.  Pt to demonstrate 3/5 Rt shoulder MMT to 90 degrees of flexion and 90 degrees of abduction.  Baseline:  unable 1/22: not able to due to protocol Goal status: in progress   LONG TERM GOALS: Target date: 05/28/2023  Pt to improve FOTO survey goal by >60 points to indicate a monstrous reduction in difficulty of Rt limb use in a variety of  questionable activities.  Baseline: 4 1/22: 44 Goal status: Deferred   2.  Pt to demonstrate 5/5 Rt shoulder flexion, abduction, ER, IR, extension; 5/5 Rt elbow flexion/extension  Baseline: 1/22: deferred due to protocol 3/4: 3/5 strength  4/1: flexion 4/5, extension 4/5 abduction 3/5 5/14: Flexion 4/5, Extension: 4+/5, Abduction: 3*/5; pt still reporting significant pain in the shoulder flexion/abduction.  Goal status: Ongoing   3.  Pt to demonstrate >155 degrees Rt shoulder A/ROM without end-range pain>2/10.  Baseline: 1/22: flexion: 70, abduction 38, scaption 71 3/4: flexion: 115 abduction: 92 scaption:  120  4/1: flexion: 131 abduction: 138; scaption: 150 5/14: Flexion: 150, Abduction: 170, Scaption: 150; 2/10 pain at the end range "feeling stuck" Goal status: Partially Met  4.  Pt to demonstrate ability to perform gym-type exercises of resistance and aerobic natures without pain exacerbation in shoulder to allow for return to regular exercise at DC.  Baseline: 1/22: not released from protocol yet 3/4: no yet to this point of protocol  Goal status: Ongoing   5   Patient will decrease Quick DASH score by > 8 points demonstrating reduced self-reported upper extremity disability. Baseline: 4/1: 37%  5/14: 25% Goal status: MET, will continue to monitor  PLAN:  PT FREQUENCY: 1-2x/week  PT DURATION: 8 weeks   PLANNED INTERVENTIONS: 97110-Therapeutic exercises, 97530- Therapeutic activity, 97112- Neuromuscular re-education, 97535- Self Care, 46962- Manual therapy, 97014- Electrical stimulation (unattended), [phone]- Electrical stimulation (manual), Patient/Family education, and Moist heat  PLAN FOR NEXT SESSION: continue per Shoulder protocol   Zarianna Dicarlo  Brain Cahill, PT, DPT Physical Therapist - [facility]  Outpatient Physical Therapy- Main Campus [phone]    07/08/23, 9:17 AM

## 2023-07-09 ENCOUNTER — Ambulatory Visit

## 2023-07-09 DIAGNOSIS — M6281 Muscle weakness (generalized): Secondary | ICD-10-CM

## 2023-07-09 DIAGNOSIS — M25611 Stiffness of right shoulder, not elsewhere classified: Secondary | ICD-10-CM

## 2023-07-09 DIAGNOSIS — M25511 Pain in right shoulder: Secondary | ICD-10-CM

## 2023-07-10 ENCOUNTER — Other Ambulatory Visit: Payer: Self-pay | Admitting: Family Medicine

## 2023-07-10 ENCOUNTER — Ambulatory Visit
Admission: RE | Admit: 2023-07-10 | Discharge: 2023-07-10 | Disposition: A | Discharge status: Home / Self Care | Source: Ambulatory Visit | Attending: Family Medicine | Admitting: Family Medicine

## 2023-07-10 ENCOUNTER — Encounter: Payer: Self-pay | Admitting: Family Medicine

## 2023-07-10 DIAGNOSIS — R053 Chronic cough: Secondary | ICD-10-CM

## 2023-07-10 DIAGNOSIS — R059 Cough, unspecified: Secondary | ICD-10-CM | POA: Diagnosis not present

## 2023-07-12 ENCOUNTER — Ambulatory Visit (HOSPITAL_BASED_OUTPATIENT_CLINIC_OR_DEPARTMENT_OTHER): Admitting: Orthopaedic Surgery

## 2023-07-12 ENCOUNTER — Ambulatory Visit: Payer: Self-pay | Admitting: Family Medicine

## 2023-07-12 DIAGNOSIS — M75111 Incomplete rotator cuff tear or rupture of right shoulder, not specified as traumatic: Secondary | ICD-10-CM

## 2023-07-12 DIAGNOSIS — M67951 Unspecified disorder of synovium and tendon, right thigh: Secondary | ICD-10-CM

## 2023-07-12 NOTE — Progress Notes (Signed)
 Post Operative Evaluation     Procedure/Date of Surgery: Right rotator cuff repair with collagen patch augmentation 12/5   Interval History:  . Presents 6 months status post the above procedure.  Overall she is doing very well with regard to her range of motion perspective which is much improved since her last visit.  She did quite well with an injection.  She is continuing to work through physical therapy.  She is also endorsing right hip pain and weakness with a limp on this right side.  This has been essentially persistent since her previous hip arthroplasty.  PMH/PSH/Family History/Social History/Meds/Allergies:    Past Medical History:  Diagnosis Date   Anemia    Asthma    well controlled   Colon polyps    Complication of anesthesia    DDD (degenerative disc disease), lumbar    Depression    GERD (gastroesophageal reflux disease)    occ   Iron  deficiency anemia    Migraine    Morbid obesity (HCC)    Osteoarthritis    Osteoarthritis of right hip    PONV (postoperative nausea and vomiting)    with c-section x 1   Right rotator cuff tear 11/2022   Vitamin D  deficiency    Past Surgical History:  Procedure Laterality Date   BREAST CYST ASPIRATION Left    CESAREAN SECTION  1990   CESAREAN SECTION  1993   CESAREAN SECTION  1996   COLONOSCOPY N/A 09/22/2020   Procedure: COLONOSCOPY;  Surgeon: Irby Mannan, MD;  Location: [facility];  Service: Endoscopy;  Laterality: N/A;   LAPAROSCOPY  2018   LEEP  2005   benign pathology; Dr. Laney Piper   SHOULDER ACROMIOPLASTY Right 01/03/2023   Procedure: RIGHT SHOULDER ACROMIOPLASTY;  Surgeon: Wilhelmenia Harada, MD;  Location: [facility];  Service: Orthopedics;  Laterality: Right;   SHOULDER ARTHROSCOPY WITH ROTATOR CUFF REPAIR AND OPEN BICEPS TENODESIS Right 01/03/2023   Procedure: RIGHT SHOULDER ARTHROSCOPY WITH ROTATOR CUFF REPAIR AND  BICEPS TENODESIS;  Surgeon: Wilhelmenia Harada, MD;   Location: [facility];  Service: Orthopedics;  Laterality: Right;   SUPRACERVICAL ABDOMINAL HYSTERECTOMY  2010   leio/AUB; Dr. Laney Piper   TOTAL HIP ARTHROPLASTY Right 11/12/2018   Procedure: TOTAL HIP ARTHROPLASTY ANTERIOR APPROACH;  Surgeon: Jerlyn Moons, MD;  Location: [facility];  Service: Orthopedics;  Laterality: Right;   Social History   Socioeconomic History   Marital status: Divorced    Spouse name: Not on file   Number of children: 3   Years of education: Not on file   Highest education level: Associate degree: academic program  Occupational History   Not on file  Tobacco Use   Smoking status: Never   Smokeless tobacco: Never  Vaping Use   Vaping status: Never Used  Substance and Sexual Activity   Alcohol use: Yes    Alcohol/week: 0.0 standard drinks of alcohol    Comment: occasionally   Drug use: No   Sexual activity: Yes    Birth control/protection: Surgical    Comment: Hysterectomy  Other Topics Concern   Not on file  Social History Narrative   Not on file   Social Drivers of Health   Financial Resource Strain: Medium Risk (03/26/2023)   Overall Financial Resource Strain (CARDIA)    Difficulty of Paying Living Expenses: Somewhat  hard  Food Insecurity: Patient Declined (03/26/2023)   Hunger Vital Sign    Worried About Running Out of Food in the Last Year: Patient declined    Ran Out of Food in the Last Year: Patient declined  Transportation Needs: No Transportation Needs (03/26/2023)   PRAPARE - Administrator, Civil Service (Medical): No    Lack of Transportation (Non-Medical): No  Physical Activity: Unknown (03/26/2023)   Exercise Vital Sign    Days of Exercise per Week: 0 days    Minutes of Exercise per Session: Not on file  Recent Concern: Physical Activity - Inactive (03/26/2023)   Exercise Vital Sign    Days of Exercise per Week: 0 days    Minutes of Exercise per Session: 90 min  Stress: Stress Concern Present (03/26/2023)   [employer]  of Occupational Health - Occupational Stress Questionnaire    Feeling of Stress : To some extent  Social Connections: Socially Isolated (03/26/2023)   Social Connection and Isolation Panel    Frequency of Communication with Friends and Family: More than three times a week    Frequency of Social Gatherings with Friends and Family: Three times a week    Attends Religious Services: Never    Active Member of Clubs or Organizations: No    Attends Engineer, structural: Not on file    Marital Status: Divorced   Family History  Problem Relation Age of Onset   Depression Mother    Bipolar disorder Brother    Colon cancer Maternal Grandfather        not sure of age   Breast cancer Neg Hx    Allergies  Allergen Reactions   Celecoxib Hives   Prednisone      intolerance, makes her angry. Makes her angry   Topamax  [Topiramate ]     Mood changes   Zolpidem     Mood changes   Codeine Rash   Penicillins Rash    Did it involve swelling of the face/tongue/throat, SOB, or low BP? No Did it involve sudden or severe rash/hives, skin peeling, or any reaction on the inside of your mouth or nose? Yes Did you need to seek medical attention at a hospital or doctor's office? Yes When did it last happen? More than 20 years ago       If all above answers are "NO", may proceed with cephalosporin use.    Current Outpatient Medications  Medication Sig Dispense Refill   albuterol  (PROVENTIL ) (2.5 MG/3ML) 0.083% nebulizer solution Take 3 mLs (2.5 mg total) by nebulization every 6 (six) hours as needed for wheezing or shortness of breath 75 mL 0   chlorpheniramine-HYDROcodone  (TUSSIONEX) 10-8 MG/5ML Take 5 mLs by mouth every 12 (twelve) hours as needed for cough (will cause drowsiness.). 115 mL 0   desvenlafaxine  (PRISTIQ ) 50 MG 24 hr tablet Take 1 tablet (50 mg total) by mouth daily. 90 tablet 1   diclofenac  Sodium (VOLTAREN ) 1 % GEL Apply 2 grams topically 4 (four) times daily. 100 g 1   famotidine   (PEPCID ) 40 MG tablet Take 1 tablet (40 mg total) by mouth daily. 90 tablet 1   Fluticasone -Umeclidin-Vilant (TRELEGY ELLIPTA ) 200-62.5-25 MCG/ACT AEPB Inhale 1 puff into the lungs daily. (Patient taking differently: Inhale 1 puff into the lungs at bedtime.) 60 each 6   gabapentin  (NEURONTIN ) 300 MG capsule Take 3 capsules (900 mg total) by mouth at bedtime. 270 capsule 1   Galcanezumab -gnlm (EMGALITY ) 120 MG/ML SOAJ Inject 1 mL into the  skin every 30 (thirty) days. 1 mL 5   lidocaine  (LIDODERM ) 5 % Place 2 patches onto the skin daily. Remove & Discard patch within 12 hours or as directed by MD 60 patch 2   pantoprazole  (PROTONIX ) 40 MG tablet Take 1 tablet (40 mg total) by mouth daily. (Patient taking differently: Take 40 mg by mouth daily as needed.) 30 tablet 1   prazosin  (MINIPRESS ) 2 MG capsule Take 1 capsule (2 mg total) by mouth at bedtime. 90 capsule 0   traMADol  (ULTRAM ) 50 MG tablet Take 1 tablet (50 mg total) by mouth every 6 (six) hours as needed. (Patient not taking: Reported on 06/19/2023) 30 tablet 0   No current facility-administered medications for this visit.   DG Chest 2 View Result Date: 07/12/2023 CLINICAL DATA:  Cough. EXAM: CHEST - 2 VIEW COMPARISON:  09/06/2022 and older studies. FINDINGS: Cardiac silhouette is normal in size and configuration. No mediastinal or hilar masses. No evidence of adenopathy. Clear lungs.  No pleural effusion or pneumothorax. Skeletal structures are intact. IMPRESSION: No active cardiopulmonary disease. Electronically Signed   By: Amanda Jungling M.D.   On: 07/12/2023 11:14    Review of Systems:   A ROS was performed including pertinent positives and negatives as documented in the HPI.   Musculoskeletal Exam:    There were no vitals taken for this visit.  Right shoulder incisions are well-appearing without erythema or drainage.  Passive range of motion as well as active range of motion is to 135 degrees in the standing position against gravity.   External rotation at side is 30 degrees 2+ radial pulse.  Distal neurosensory exam is intact.  Sling is well-fitting after adjustment  Right hip with positive Trendelenburg gait and pain over the trochanter with pain with resisted abduction of the right leg.  There is 3 degrees internal/external rotation without pain.  Distal neurosensory exam is intact  Imaging:      I personally reviewed and interpreted the radiographs.   Assessment:   6 months status post right shoulder rotator cuff repair with collagen patch augmentation.  Overall her right shoulder is continuing to improve.  She has previously worked with physical therapy on the right hip but is not getting any relief.  Given this I do believe she ultimately benefit from an MRI of the right hip as that we can rule out underlying gluteal atrophy or tearing.  Will plan to proceed with this and I will see her back following discuss results Plan :    - Right shoulder ultrasound-guided injection provided after verbal consent obtained    Procedure Note  Patient: Shelby Henson             Date of Birth: 02-21-1970           MRN: [id number]             Visit Date: 07/12/2023  Procedures: Visit Diagnoses: No diagnosis found.  No procedures performed      I personally saw and evaluated the patient, and participated in the management and treatment plan.  Wilhelmenia Harada, MD Attending Physician, Orthopedic Surgery  This document was dictated using Dragon voice recognition software. A reasonable attempt at proof reading has been made to minimize errors.

## 2023-07-13 DIAGNOSIS — F4322 Adjustment disorder with anxiety: Secondary | ICD-10-CM | POA: Diagnosis not present

## 2023-07-15 ENCOUNTER — Encounter: Payer: Self-pay | Admitting: Pulmonary Disease

## 2023-07-16 ENCOUNTER — Encounter: Payer: Self-pay | Admitting: Pulmonary Disease

## 2023-07-16 ENCOUNTER — Other Ambulatory Visit: Payer: Self-pay

## 2023-07-16 ENCOUNTER — Ambulatory Visit (INDEPENDENT_AMBULATORY_CARE_PROVIDER_SITE_OTHER): Admitting: Pulmonary Disease

## 2023-07-16 VITALS — HR 87 | Ht 59.0 in | Wt 177.0 lb

## 2023-07-16 DIAGNOSIS — J4540 Moderate persistent asthma, uncomplicated: Secondary | ICD-10-CM | POA: Diagnosis not present

## 2023-07-16 DIAGNOSIS — K219 Gastro-esophageal reflux disease without esophagitis: Secondary | ICD-10-CM | POA: Diagnosis not present

## 2023-07-16 LAB — NITRIC OXIDE: Nitric Oxide: 12

## 2023-07-16 MED ORDER — TRELEGY ELLIPTA 200-62.5-25 MCG/ACT IN AEPB
1.0000 | INHALATION_SPRAY | Freq: Every day | RESPIRATORY_TRACT | 6 refills | Status: DC
Start: 2023-07-16 — End: 2023-10-23
  Filled 2023-07-16: qty 60, 30d supply, fill #0
  Filled 2023-09-09: qty 180, 90d supply, fill #0

## 2023-07-16 NOTE — Progress Notes (Unsigned)
 Synopsis: Referred in by Arleen Lacer, MD   Subjective:   PATIENT ID: Shelby Henson GENDER: female DOB: 29-Jan-1971, MRN: [id number]  Chief Complaint  Patient presents with   Asthma    HPI Ms Aylesworth is a 53 y.o female patient with a past medical history of moderate persistent asthma and GERD presenting to the pulmonary clinic today for further management of her asthma.   She had asthma as a child which she grew out of and then symptoms restarted in her early 54s. She was on pulmicort  nebulizer and albuterol  as needed. She never required hospitalization and symptoms manifest as dry cough usually.   I saw her in August 2024 for complaints of 4 months of dry cough that persisted. She was trialed on airsupra  with minimal benefit. About a week prior to her presentation, she was started on Trelegy Ellipta  100 1 puff daily which seems to be helping. I increased her Trelegy Ellipta  to 200 which seems to have helped with cough. She denies any rash, neuropathy, allergic rhinitis or cardiac symptoms.   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 No travel outside the [place] . She has 3 kids who are healthy.   ROS All systems were reviewed and are negative except for the above.  Objective:   Vitals:   07/16/23 1324  Pulse: 87  SpO2: 98%  Weight: 177 lb (80.3 kg)  Height: 4' 11 (1.499 m)   98% on RA BMI Readings from Last 3 Encounters:  07/16/23 35.75 kg/m  06/19/23 35.04 kg/m  05/27/23 36.15 kg/m   Wt Readings from Last 3 Encounters:  07/16/23 177 lb (80.3 kg)  06/19/23 173 lb 8 oz (78.7 kg)  05/27/23 179 lb (81.2 kg)    Physical Exam GEN: NAD, Healthy Appearing HEENT: Supple Neck, Reactive Pupils, EOMI  CVS: Normal S1, Normal S2, RRR, No murmurs or ES appreciated  Lungs: Clear bilateral air entry.  Abdomen: Soft, non tender, non distended, + BS  Extremities: Warm and well perfused, No edema  Skin: No  suspicious lesions appreciated  Psych: Normal Affect  Ancillary Information   CBC    Component Value Date/Time   WBC 8.4 03/29/2023 1027   RBC 4.03 03/29/2023 1027   HGB 11.1 (L) 03/29/2023 1027   HGB 12.2 08/04/2018 1557   HCT 34.0 (L) 03/29/2023 1027   HCT 36.3 08/04/2018 1557   PLT 252 03/29/2023 1027   PLT 208 08/04/2018 1557   MCV 84.4 03/29/2023 1027   MCV 82 08/04/2018 1557   MCV 86 08/07/2011 2024   MCH 27.5 03/29/2023 1027   MCHC 32.6 03/29/2023 1027   RDW 15.0 03/29/2023 1027   RDW 13.6 08/04/2018 1557   RDW 13.8 08/07/2011 2024   LYMPHSABS 2,019 08/10/2022 1150   LYMPHSABS 2.1 05/20/2015 0824   LYMPHSABS 2.6 08/07/2011 2024   MONOABS 0.5 12/08/2019 1633   MONOABS 0.6 08/07/2011 2024   EOSABS 1,268 (H) 03/29/2023 1027   EOSABS 1.1 (H) 05/20/2015 0824   EOSABS 1.0 (H) 08/07/2011 2024   BASOSABS 59 03/29/2023 1027   BASOSABS 0.0 05/20/2015 0824   BASOSABS 0.1 08/07/2011 2024        No data to display         Chest x-ray 08/06 no acute intrathoracic process.  Using over-the-counter in July 2024 897 Allergen panel was negative.  IgE 4.  Creatinine 0.7 and BUN 15. Strongyloides serology was negative.  Assessment &  Plan:  Ms Hanssen is a 53 y.o female patient with a past medical history of moderate persistent asthma and GERD presenting to the pulmonary clinic today for further management of her asthma.   #Moderate Persistent Asthma  Manifests as dry cough. Elevated EOS at 1100 with EGPA in the differential. No other clear organ involvement.   []  PFT  []  CBC with differential, ANCA, UA to assess for proteinuria. []  Continue with Fluticasone -Umeclidinum-Vilanterol [Trelegy Ellipta ] to 260mcg-62.5mcg-25mcg 1 puff daily.  []  Can discontinue montelukast  [Singulair ] 10mg  1tab daily as she says she did not notice any difference. []  Continue with budesonide -albuterol  [Airsupra ] 80-92 puffs every 6 hours as needed. []  Will discuss biologic options on follow-up  visit.  #GERD Currently on famotidine  40mg  1tab daily.   []  Start Pantoprazole  40mg  1 tab PO daily  []  Decrease Famotidine  to 20mg  1tab daily  []  Advise on lifestyle modifications.   No follow-ups on file.  I spent 45 minutes caring for this patient today, including preparing to see the patient, obtaining a medical history , reviewing a separately obtained history, performing a medically appropriate examination and/or evaluation, counseling and educating the patient/family/caregiver, ordering medications, tests, or procedures, documenting clinical information in the electronic health record, and independently interpreting results (not separately reported/billed) and communicating results to the patient/family/caregiver  Annitta Kindler, MD [facility] 07/16/2023 1:41 PM

## 2023-07-18 DIAGNOSIS — F4322 Adjustment disorder with anxiety: Secondary | ICD-10-CM | POA: Diagnosis not present

## 2023-07-23 ENCOUNTER — Encounter (HOSPITAL_BASED_OUTPATIENT_CLINIC_OR_DEPARTMENT_OTHER): Payer: Self-pay | Admitting: Orthopaedic Surgery

## 2023-07-23 NOTE — Therapy (Incomplete)
 OUTPATIENT PHYSICAL THERAPY SHOULDER TREATMENT/ RECERT        Patient Name: Shelby Henson MRN: [id number] DOB:[birth year], 53 y.o., female Today's Date: 07/24/2023  END OF SESSION:  PT End of Session - 07/24/23 0703     Visit Number 26    Number of Visits 30    Date for PT Re-Evaluation 08/21/23    Authorization Type [employer]    Authorization Time Period 01/07/23-04/07/23    Progress Note Due on Visit 10    PT Start Time 0710    PT Stop Time 0755    PT Time Calculation (min) 45 min    Activity Tolerance Patient tolerated treatment well    Behavior During Therapy [facility] for tasks assessed/performed              Past Medical History:  Diagnosis Date   Anemia    Asthma    well controlled   Colon polyps    Complication of anesthesia    DDD (degenerative disc disease), lumbar    Depression    GERD (gastroesophageal reflux disease)    occ   Iron  deficiency anemia    Migraine    Morbid obesity (HCC)    Osteoarthritis    Osteoarthritis of right hip    PONV (postoperative nausea and vomiting)    with c-section x 1   Right rotator cuff tear 11/2022   Vitamin D  deficiency    Past Surgical History:  Procedure Laterality Date   BREAST CYST ASPIRATION Left    CESAREAN SECTION  1990   CESAREAN SECTION  1993   CESAREAN SECTION  1996   COLONOSCOPY N/A 09/22/2020   Procedure: COLONOSCOPY;  Surgeon: Janalyn Keene NOVAK, MD;  Location: [facility];  Service: Endoscopy;  Laterality: N/A;   LAPAROSCOPY  2018   LEEP  2005   benign pathology; Dr. Rolm   SHOULDER ACROMIOPLASTY Right 01/03/2023   Procedure: RIGHT SHOULDER ACROMIOPLASTY;  Surgeon: Genelle Standing, MD;  Location: [facility] ORS;  Service: Orthopedics;  Laterality: Right;   SHOULDER ARTHROSCOPY WITH ROTATOR CUFF REPAIR AND OPEN BICEPS TENODESIS Right 01/03/2023   Procedure: RIGHT SHOULDER ARTHROSCOPY WITH ROTATOR CUFF REPAIR AND  BICEPS TENODESIS;  Surgeon: Genelle Standing, MD;  Location: [facility] ORS;   Service: Orthopedics;  Laterality: Right;   SUPRACERVICAL ABDOMINAL HYSTERECTOMY  2010   leio/AUB; Dr. Rolm   TOTAL HIP ARTHROPLASTY Right 11/12/2018   Procedure: TOTAL HIP ARTHROPLASTY ANTERIOR APPROACH;  Surgeon: Leora Lynwood SAUNDERS, MD;  Location: [facility] ORS;  Service: Orthopedics;  Laterality: Right;   Patient Active Problem List   Diagnosis Date Noted   Severe persistent asthma without complication 03/29/2023   Nontraumatic incomplete tear of right rotator cuff 01/03/2023   Right rotator cuff tendonitis 09/11/2022   Iron  deficiency anemia due to dietary causes 08/10/2022   Chronic right shoulder pain 05/11/2022   Morbid obesity (HCC) 01/05/2022   Rotator cuff impingement syndrome, right 09/29/2021   Biceps tendinitis, right 09/29/2021   Meralgia paresthetica of right side 03/29/2021   Psoas tendinitis, right hip 03/29/2021   History of total right hip arthroplasty 03/29/2021   Status post total replacement of right hip 02/19/2021   Meralgia paresthetica of right side 02/19/2021   Polyp of ascending colon 02/19/2021   Polyp of sigmoid colon 02/19/2021   Special screening for malignant neoplasms, colon    Polyp of ascending colon    Polyp of sigmoid colon    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 03/23/2020  Pain in right hip 01/12/2019   Vitamin D  deficiency 02/25/2018   Chronic pain 10/24/2015   Hot flashes 10/24/2015   Migraine without aura and without status migrainosus, not intractable 10/24/2015   Metabolic syndrome 10/24/2015   Dyslipidemia 10/24/2015   Allergic rhinitis, seasonal 05/17/2015   Chronic insomnia 08/08/2014   DDD (degenerative disc disease), lumbar 08/08/2014   Mood disorder (HCC) 08/08/2014   Gastroesophageal reflux disease without esophagitis 08/08/2014   H/O total hysterectomy 08/08/2014   Irritable bowel syndrome with diarrhea 08/08/2014   Osteoarthritis of right hip 08/08/2014   Adult BMI 30+ 08/08/2014   SBO (spina  bifida occulta) 08/08/2014    PCP: Dorette Loron, MD   REFERRING PROVIDER: Elspeth Parker, MD orthopedics   REFERRING DIAG: Rt arthroscopic rotator cuff repair   THERAPY DIAG:  Right shoulder pain, unspecified chronicity - Plan: PT plan of care cert/re-cert  Stiffness of right shoulder, not elsewhere classified - Plan: PT plan of care cert/re-cert  Muscle weakness (generalized) - Plan: PT plan of care cert/re-cert  Rationale for Evaluation and Treatment: Rehabilitation  ONSET DATE: 01/03/23  SUBJECTIVE:                                                                                                                                                                                      SUBJECTIVE STATEMENT: Patient's surgeon requests patient continue with PT. Patient reports pain increase in bicep and surgical arm.  Hand dominance: Right  PERTINENT HISTORY: Shelby Henson is a 53yo F presents to outpatient physical therapy on postop day 4 status post right arthroscopic shoulder debridement of supraspinatus tendon, anterior glenoid labrum, subacromial depression, rotator cuff repair with Dr. Elspeth Parker.  Patient reports more than 1 year of insidious onset shoulder pain previously followed by sports medicine Dr. Alvia.  PAIN:  Are you having pain? 3/10 right shoulder: Worst pain was 8 out of 10 while sleeping overnight 2 nights ago.  PRECAUTIONS: Patient in sling immobilizer and nonweightbearing x 2 weeks.  Awaiting updates pending follow-up visit with orthopedics.   WEIGHT BEARING RESTRICTIONS: Yes, right upper extremity nonweightbearing  FALLS:  Has patient fallen in last 6 months?  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 PATIENT SURVEYS:  FOTO: 4  SENSATION: WNL  POSTURE: WNL  UPPER EXTREMITY ROM:    ROM Right eval Left eval  Shoulder flexion 90   Shoulder extension NT   Shoulder  abduction NT   Shoulder internal rotation full   Shoulder external rotation NT   Elbow flexion full   Elbow extension full    Wrist flexion full    Wrist extension full    Wrist ulnar deviation full    Wrist radial deviation full    forearm pronation full    forearm supination full    (Blank rows = not tested)    PALPATION:  Tenderness at the posterior shoulder portal wound while adjusting/donning sling   *PROTOCOL LOCATED IN MEDIA SECTION OF EPIC   TODAY'S TREATMENT: DATE: 07/24/23  Physical therapy treatment session today consisted of completing assessment of goals and administration of testing as demonstrated and documented in flow sheet, treatment, and goals section of this note. Addition treatments may be found below.    Manual therapy: (supine)  Gentle grade 1-2 GH joint mobs (inf/PA/AP) x several min    TherEx: Gentle PROM right shoulder flex, abduction, ER, IR 10x  with prolonged holds  Wall stretch:  Scarecrows 10x  Abduction snow angels 10x Tricep stretch 10x Modified wall cat cow 10x for scapular stabilization  Seated: Bicep stretch 30 seconds Shoulder adduction/abduction 10x IR hands on knees to hands behind back 10x   Trigger Point Dry Needling  Subsequent Treatment: Instructions provided previously at initial dry needling treatment.   Patient Verbal Consent Given: Yes Education Handout Provided: Previously Provided Muscles Treated: R bicep and upper trap  Electrical Stimulation Performed: No Treatment Response/Outcome: excellent twitch response      PATIENT EDUCATION: Education details: See above- extensive education this visit  Person educated: patient  Education method: Research scientist (medical), deliberate practice,  positive reinforcement, explicit instruction, establish rules,  Education comprehension: adequate   HOME EXERCISE PROGRAM: Access Code: [email or web address] URL: [email or web address] Date: 04/17/2023 Prepared by: Val Farnam  Exercises - Seated Shoulder Flexion Full Range  - 1 x daily - 7 x weekly - 2 sets - 10 reps - 5 hold - Seated Shoulder Abduction  - 1 x daily - 7 x weekly - 2 sets - 10 reps - 5 hold - Seated Shoulder External Rotation  - 1 x daily - 7 x weekly - 2 sets - 10 reps - 5 hold - Seated Scapular Retraction  - 1 x daily - 7 x weekly - 2 sets - 10 reps - 5 hold - Supine Shoulder Flexion with Dowel  - 1 x daily - 7 x weekly - 2 sets - 10 reps - 5 hold - Supine Shoulder External Rotation in Abduction  - 1 x daily - 7 x weekly - 2 sets - 10 reps - 5 hold - Supine Shoulder Abduction AROM  - 1 x daily - 7 x weekly - 2 sets - 10 reps - 5 hold Access Code: [email or web address] URL: [email or web address] Date: 02/04/2023 Prepared by: Kera Deacon  Exercises - Supine Shoulder Flexion AAROM with Dowel  - 1 x daily - 7 x weekly - 2 sets - 10 reps - 5 hold - Supine Shoulder Abduction AAROM with Dowel  - 1 x daily - 7 x weekly - 2 sets - 10 reps - 5 hold - Supine Shoulder External Rotation with Dowel  - 1 x daily - 7 x weekly - 2 sets - 10 reps - 5 hold - Seated Scapular Retraction  - 1 x daily - 7 x weekly - 2 sets - 10 reps - 5 hold  ASSESSMENT:  CLINICAL IMPRESSION: Patient's surgeon wants to continue physical therapy at this time. Will  re-adjust POC to 1x/week for 4 weeks for strengthening and HEP formation.  Patient agreeable with POC. Patient is progressing with strength and ROM however does continue to have some deficits with abduction and external rotation. Pt will continue to benefit from skilled therapy to address remaining deficits in order to improve overall QoL and return to PLOF.      OBJECTIVE IMPAIRMENTS: decreased activity tolerance, decreased endurance,  decreased knowledge of condition, decreased ROM, decreased strength, increased muscle spasms, impaired flexibility, and improper body mechanics.   ACTIVITY LIMITATIONS: carrying, lifting, bathing, toileting, dressing, self feeding, reach over head, and hygiene/grooming  PARTICIPATION LIMITATIONS: meal prep, cleaning, laundry, driving, shopping, community activity, occupation, and yard work  PERSONAL FACTORS: Age, Behavior pattern, Education, Fitness, Past/current experiences, Profession, Social background, Time since onset of injury/illness/exacerbation, and Transportation are also affecting patient's functional outcome.   REHAB POTENTIAL: Excellent  CLINICAL DECISION MAKING: Stable/uncomplicated  EVALUATION COMPLEXITY: Low   GOALS: Goals reviewed with patient? No  SHORT TERM GOALS: Target date: 04/30/2023  Pt to demonstrate Rt shoulder P/ROM flexion to >120 degrees, ABDCT >90 degrees, ER >45 degrees to improve ability to perform ADL/selfcare.  Baseline: 1/22: flexion: 70, abduction 38, scaption 71 3/4: MET Goal status:MET  2.  Pt to report successful HEP performance and successful adherence to postoperative restrictions.  Baseline: HEP compliant Goal status:MET  3.  Pt to demonstrate 3/5 Rt shoulder MMT to 90 degrees of flexion and 90 degrees of abduction.  Baseline: unable 1/22: not able to due to protocol 6/25; met Goal status: iMET   LONG TERM GOALS: Target date: 08/21/2023    Pt to improve FOTO survey goal by >60 points to indicate a monstrous reduction in difficulty of Rt limb use in a variety of questionable activities.  Baseline: 4 1/22: 44 Goal status: Deferred   2.  Pt to demonstrate 5/5 Rt shoulder flexion, abduction, ER, IR, extension; 5/5 Rt elbow flexion/extension  Baseline: 1/22: deferred due to protocol 3/4: 3/5 strength  4/1: flexion 4/5, extension 4/5 abduction 3/5 5/14: Flexion 4/5, Extension: 4+/5, Abduction: 3*/5; pt still reporting significant pain in the  shoulder flexion/abduction. 6/25: flexion 4+/5 abduction 4-/5 with pain, extension 5/5; IR 4+ ER 3+ Goal status: Ongoing   3.  Pt to demonstrate >155 degrees Rt shoulder A/ROM without end-range pain>2/10.  Baseline: 1/22: flexion: 70, abduction 38, scaption 71 3/4: flexion: 115 abduction: 92 scaption:  120  4/1: flexion: 131 abduction: 138; scaption: 150 5/14: Flexion: 150, Abduction: 170, Scaption: 150; 2/10 pain at the end range feeling stuck Goal status:MET  4.  Pt to demonstrate ability to perform gym-type exercises of resistance and aerobic natures without pain exacerbation in shoulder to allow for return to regular exercise at DC.  Baseline: 1/22: not released from protocol yet 3/4: no yet to this point of protocol  6/25: has not been to the gym due to secondary health issue  Goal status: Ongoing   5   Patient will decrease Quick DASH score by > 8 points demonstrating reduced self-reported upper extremity disability. Baseline: 4/1: 37%  5/14: 25% 6/25: 20%  Goal status: MET, Progressed  PLAN:  PT FREQUENCY: 1-2x/week  PT DURATION: 4 weeks   PLANNED INTERVENTIONS: 97110-Therapeutic exercises, 97530- Therapeutic activity, 97112- Neuromuscular re-education, 97535- Self Care, [id number]- Manual therapy, 97014- Electrical stimulation (unattended), [phone]- Electrical stimulation (manual), Patient/Family education, and Moist heat  PLAN FOR NEXT SESSION: strengthening, pain reduction   Lisandra Mathisen  Leopoldo, PT, DPT Physical Therapist - [facility]  Outpatient Physical Therapy- Main Campus [phone]    07/24/23, 8:16 AM

## 2023-07-24 ENCOUNTER — Encounter: Payer: Self-pay | Admitting: Pulmonary Disease

## 2023-07-24 ENCOUNTER — Other Ambulatory Visit: Payer: Self-pay | Admitting: Nurse Practitioner

## 2023-07-24 ENCOUNTER — Ambulatory Visit

## 2023-07-24 ENCOUNTER — Other Ambulatory Visit: Payer: Self-pay

## 2023-07-24 DIAGNOSIS — M25511 Pain in right shoulder: Secondary | ICD-10-CM | POA: Diagnosis not present

## 2023-07-24 DIAGNOSIS — M25611 Stiffness of right shoulder, not elsewhere classified: Secondary | ICD-10-CM

## 2023-07-24 DIAGNOSIS — M6281 Muscle weakness (generalized): Secondary | ICD-10-CM

## 2023-07-24 DIAGNOSIS — J4521 Mild intermittent asthma with (acute) exacerbation: Secondary | ICD-10-CM

## 2023-07-24 MED ORDER — AIRSUPRA 90-80 MCG/ACT IN AERO
1.0000 | INHALATION_SPRAY | RESPIRATORY_TRACT | 5 refills | Status: AC | PRN
Start: 2023-07-24 — End: ?
  Filled 2023-07-24: qty 10.7, 30d supply, fill #0

## 2023-07-25 ENCOUNTER — Encounter (HOSPITAL_BASED_OUTPATIENT_CLINIC_OR_DEPARTMENT_OTHER): Payer: Self-pay | Admitting: Orthopaedic Surgery

## 2023-07-26 ENCOUNTER — Other Ambulatory Visit: Payer: Self-pay

## 2023-07-29 ENCOUNTER — Encounter (HOSPITAL_BASED_OUTPATIENT_CLINIC_OR_DEPARTMENT_OTHER): Payer: Self-pay | Admitting: Orthopaedic Surgery

## 2023-08-01 ENCOUNTER — Ambulatory Visit

## 2023-08-05 ENCOUNTER — Ambulatory Visit: Attending: Orthopaedic Surgery

## 2023-08-05 ENCOUNTER — Other Ambulatory Visit: Payer: Self-pay

## 2023-08-05 DIAGNOSIS — M25611 Stiffness of right shoulder, not elsewhere classified: Secondary | ICD-10-CM | POA: Insufficient documentation

## 2023-08-05 DIAGNOSIS — M25511 Pain in right shoulder: Secondary | ICD-10-CM | POA: Insufficient documentation

## 2023-08-05 DIAGNOSIS — M6281 Muscle weakness (generalized): Secondary | ICD-10-CM | POA: Insufficient documentation

## 2023-08-05 DIAGNOSIS — M19041 Primary osteoarthritis, right hand: Secondary | ICD-10-CM | POA: Insufficient documentation

## 2023-08-05 DIAGNOSIS — M79641 Pain in right hand: Secondary | ICD-10-CM | POA: Insufficient documentation

## 2023-08-05 DIAGNOSIS — R278 Other lack of coordination: Secondary | ICD-10-CM | POA: Diagnosis present

## 2023-08-05 NOTE — Therapy (Signed)
 OUTPATIENT OCCUPATIONAL THERAPY ORTHO EVALUATION  Patient Name: Shelby Henson MRN: [id number] DOB:11/23/1970, 53 y.o., female Today's Date: 08/05/2023  PCP: Dr. Dorette Loron REFERRING PROVIDER: Dr. Elspeth Parker (ortho)  END OF SESSION:  OT End of Session - 08/05/23 0904     Visit Number 1    Number of Visits 12    Date for OT Re-Evaluation 09/13/23    OT Start Time 0930    OT Stop Time 1015    OT Time Calculation (min) 45 min    Activity Tolerance Patient tolerated treatment well    Behavior During Therapy WFL for tasks assessed/performed          Past Medical History:  Diagnosis Date   Anemia    Asthma    well controlled   Colon polyps    Complication of anesthesia    DDD (degenerative disc disease), lumbar    Depression    GERD (gastroesophageal reflux disease)    occ   Iron  deficiency anemia    Migraine    Morbid obesity (HCC)    Osteoarthritis    Osteoarthritis of right hip    PONV (postoperative nausea and vomiting)    with c-section x 1   Right rotator cuff tear 11/2022   Vitamin D  deficiency    Past Surgical History:  Procedure Laterality Date   BREAST CYST ASPIRATION Left    CESAREAN SECTION  1990   CESAREAN SECTION  1993   CESAREAN SECTION  1996   COLONOSCOPY N/A 09/22/2020   Procedure: COLONOSCOPY;  Surgeon: Janalyn Keene NOVAK, MD;  Location: [facility];  Service: Endoscopy;  Laterality: N/A;   LAPAROSCOPY  2018   LEEP  2005   benign pathology; Dr. Rolm   SHOULDER ACROMIOPLASTY Right 01/03/2023   Procedure: RIGHT SHOULDER ACROMIOPLASTY;  Surgeon: Parker Elspeth, MD;  Location: [facility];  Service: Orthopedics;  Laterality: Right;   SHOULDER ARTHROSCOPY WITH ROTATOR CUFF REPAIR AND OPEN BICEPS TENODESIS Right 01/03/2023   Procedure: RIGHT SHOULDER ARTHROSCOPY WITH ROTATOR CUFF REPAIR AND  BICEPS TENODESIS;  Surgeon: Parker Elspeth, MD;  Location: [facility];  Service: Orthopedics;  Laterality: Right;   SUPRACERVICAL ABDOMINAL HYSTERECTOMY   2010   leio/AUB; Dr. Rolm   TOTAL HIP ARTHROPLASTY Right 11/12/2018   Procedure: TOTAL HIP ARTHROPLASTY ANTERIOR APPROACH;  Surgeon: Leora Lynwood SAUNDERS, MD;  Location: [facility];  Service: Orthopedics;  Laterality: Right;   Patient Active Problem List   Diagnosis Date Noted   Severe persistent asthma without complication 03/29/2023   Nontraumatic incomplete tear of right rotator cuff 01/03/2023   Right rotator cuff tendonitis 09/11/2022   Iron  deficiency anemia due to dietary causes 08/10/2022   Chronic right shoulder pain 05/11/2022   Morbid obesity (HCC) 01/05/2022   Rotator cuff impingement syndrome, right 09/29/2021   Biceps tendinitis, right 09/29/2021   Meralgia paresthetica of right side 03/29/2021   Psoas tendinitis, right hip 03/29/2021   History of total right hip arthroplasty 03/29/2021   Status post total replacement of right hip 02/19/2021   Meralgia paresthetica of right side 02/19/2021   Polyp of ascending colon 02/19/2021   Polyp of sigmoid colon 02/19/2021   Special screening for malignant neoplasms, colon    Polyp of ascending colon    Polyp of sigmoid colon    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 03/23/2020   Pain in right hip 01/12/2019   Vitamin D  deficiency 02/25/2018   Chronic pain 10/24/2015   Hot flashes 10/24/2015  Migraine without aura and without status migrainosus, not intractable 10/24/2015   Metabolic syndrome 10/24/2015   Dyslipidemia 10/24/2015   Allergic rhinitis, seasonal 05/17/2015   Chronic insomnia 08/08/2014   DDD (degenerative disc disease), lumbar 08/08/2014   Mood disorder (HCC) 08/08/2014   Gastroesophageal reflux disease without esophagitis 08/08/2014   H/O total hysterectomy 08/08/2014   Irritable bowel syndrome with diarrhea 08/08/2014   Osteoarthritis of right hip 08/08/2014   Adult BMI 30+ 08/08/2014   SBO (spina bifida occulta) 08/08/2014   ONSET DATE: June 2024  REFERRING DIAG: M19.041  (ICD-10-CM) - Arthritis of right hand   THERAPY DIAG:  Muscle weakness (generalized)  Arthritis of right hand  Pain in right hand  Rationale for Evaluation and Treatment: Rehabilitation  SUBJECTIVE:  SUBJECTIVE STATEMENT: My hand just hurts all the time, so I'm constantly massaging it. Pt accompanied by: self  PERTINENT HISTORY: Hx of R frozen shoulder post rotator cuff repair, arthritis in the hand, and palmar inflammation/thickening of flexor tendons in the palm.  OT encouraged pt inquire with Dr. Genelle about whether this could be Dupyutren's contracture.  Pt presents today with c/o of pain in R hand with Dx of arthritis.  Pain is specifically palmar side at the thenar eminence.  Pt reports pain has been constant for at least the last year.    PRECAUTIONS: General joint protection/cumulative trauma prevention   RED FLAGS: None   WEIGHT BEARING RESTRICTIONS: No  PAIN:  Are you having pain? Yes: NPRS scale: 4/10 pain at rest, up to 6-7/10 with activity Pain location: R hand/thenar eminence Pain description: achy Aggravating factors: general use of the hand, holding eating utensils, pinching Relieving factors: heat, massage, soft neoprene thumb spica  FALLS: Has patient fallen in last 6 months? 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  NEXT MD VISIT: Return to Dr. Genelle on Fri 08/09/23 for cortisone shot in the R shoulder   OBJECTIVE:  Note: Objective measures were completed at Evaluation unless otherwise noted.  HAND DOMINANCE: Right  ADLs: Difficulty opening containers and drink bottles, lifting heavy pots/pans, picking up grandson (18 months), computer/mouse use (work related tasks), writing/filling out forms,   FUNCTIONAL OUTCOME MEASURES: MAM-20 for musculoskeletal conditions: 67/80  UPPER EXTREMITY ROM:  Full ROM  in R hand digits, but pain with thumb flexion.  Able to oppose all digits to thumb in R hand, but pain with 4th and 5th digit opposition to thumb.  UPPER EXTREMITY MMT:    MMT Right eval Left eval  Shoulder flexion    Shoulder abduction    Shoulder adduction    Shoulder extension    Shoulder internal rotation    Shoulder external rotation    Middle trapezius    Lower trapezius    Elbow flexion    Elbow extension    Wrist flexion 5 5  Wrist extension 5 5  Wrist ulnar deviation 5 5  Wrist radial deviation 5 5  Wrist pronation    Wrist supination    (Blank rows = not tested)  HAND FUNCTION: Grip strength: Right: 35 lbs; Left: 45 lbs, Lateral pinch: Right: 11 lbs, Left: 14 lbs, and 3 point pinch: Right: 8 lbs, Left: 10 lbs  COORDINATION: 9 Hole Peg test: Right: 18 sec; Left: 19 sec  SENSATION: No tingling/numbness in R hand.  EDEMA: No visible edema  COGNITION: Overall cognitive status:  Within functional limits for tasks assessed  OBSERVATIONS:  -Pt pleasant, cooperative, eager to work towards goals in OT poc.  -Thickened palmar/flexor tendons; encouraged pt to follow up with MD on Friday to inquire about whether this may be Dupuytren's contracture.  TREATMENT DATE: 08/05/23 Moist heat applied to R hand, used intermittently throughout session for pain management/muscle relaxation simultaneous to activities noted below.  Evaluation completed.    Iontophoresis with dexamethasone , 1.5 cc (small patch): tx time: 22 min.  (1 of 10 planned treatments completed) R volar thumb/thenar eminence, 40 mA/min, current amplitude 2.0 mA -Pt tolerated well; skin check performed with skin intact post tx   PATIENT EDUCATION: Education details: OT poc, iontophoresis; R hand strengthening (low reps/slow pinching/gripping to tolerance) Person educated: Patient Education method: Explanation and Verbal cues Education comprehension: verbalized understanding  HOME EXERCISE PROGRAM: Issued  small stress cushion for light pinching/gripping.  Will plan to issue yellow or pink putty in follow up visit.    GOALS: Goals reviewed with patient? Yes  SHORT TERM GOALS: Target date: 08/26/23  Pt will be indep to perform HEP for improving R hand strength for ADL/IADL/work related tasks and to maintain tissue extensibility in thickened flexor palmar tendons. Baseline: Eval: HEP initiated at eval; additional exercises will be added in upcoming sessions Goal status: INITIAL  2.  Pt will be indep to verbalize and implement 2-3 joint protection/cumulative trauma prevention strategies to reduce pain in the R hand.  Baseline: Eval: Education not yet initiated Goal status: INITIAL  LONG TERM GOALS: Target date: 09/13/23  Pt will increase MAM-20 score for musculoskeletal conditions by 9 or more points to indicate improvement in self perceived functional use of the R hand for daily tasks.   Baseline: Eval: 67/80 Goal status: INITIAL  2.  Pt will increase R grip strength by 10 or more lbs to improve ability to hold and carry heavy ADL supplies. Baseline: Eval: R 35 lbs (L 45 lbs) Goal status: INITIAL  3.  Pt will increase R lateral pinch strength by 5 or more lbs to ease ability to open drink bottles. Baseline: Eval: R 11 lbs (L 14 lbs) Goal status: INITIAL  4.  Pt will tolerate manual therapy, therapeutic modalities, and exercises to decrease pain in R hand to a reported 0/10 pain at rest and 3/10 pain or      less with activity.  Baseline: Eval: 4/10 pain at rest, 6-7/10 pain with activity Goal status: INITIAL   ASSESSMENT:  CLINICAL IMPRESSION: Patient is a 53 y.o. female who was seen today for occupational therapy evaluation for functional decline in ADL/IADL/work related tasks d/t pain and muscle weakness in the R dominant hand related to arthritis.  Pt reports constant aching in R thumb over the last year +, pinpointing pain to be in the R thumb thenar eminence.  Pt reporting  difficulties in ADL components which require use of force through the hand, specifically pinching and gripping.  Pt routinely wears a soft/neoprene thumb spica intermittently during the day, and finds relief from this, as well as from heat and massage.  Pt tolerated iontophoresis well this date (see above), and use of heat for pain management.  Pt will continue to benefit from skilled OT to address above noted goals, with pt's primary goals being to increase strength and decrease pain in her R dominant hand in order to improve indep and tolerance to daily tasks.  Pt in agreement with OT poc.  PERFORMANCE DEFICITS: in functional skills including ADLs,  IADLs, strength, pain, fascial restrictions, body mechanics, decreased knowledge of precautions, decreased knowledge of use of DME, and UE functional use, and psychosocial skills including coping strategies, environmental adaptation, habits, and routines and behaviors.   IMPAIRMENTS: are limiting patient from ADLs, IADLs, rest and sleep, work, leisure, and social participation.   COMORBIDITIES: has co-morbidities such as OA, R rotator cuff tear/repair, asthma that affects occupational performance. Patient will benefit from skilled OT to address above impairments and improve overall function.  MODIFICATION OR ASSISTANCE TO COMPLETE EVALUATION: No modification of tasks or assist necessary to complete an evaluation.  OT OCCUPATIONAL PROFILE AND HISTORY: Detailed assessment: Review of records and additional review of physical, cognitive, psychosocial history related to current functional performance.  CLINICAL DECISION MAKING: Moderate - several treatment options, min-mod task modification necessary  REHAB POTENTIAL: Good  EVALUATION COMPLEXITY: Moderate      PLAN:  OT FREQUENCY: 2x/week  OT DURATION: 6 weeks  PLANNED INTERVENTIONS: 97168 OT Re-evaluation, 97535 self care/ADL training, 02889 therapeutic exercise, 97530 therapeutic activity, 97140  manual therapy, 97035 ultrasound, 97018 paraffin, 02989 moist heat, 97010 cryotherapy, 97034 contrast bath, 97033 iontophoresis, 97760 Orthotic Initial, 97763 Orthotic/Prosthetic subsequent, passive range of motion, psychosocial skills training, energy conservation, coping strategies training, patient/family education, and DME and/or AE instructions  RECOMMENDED OTHER SERVICES: None at this time  CONSULTED AND AGREED WITH PLAN OF CARE: Patient  PLAN FOR NEXT SESSION: iontophoresis, HEP progression, manual therapy  Inocente Blazing, MS, OTR/L   Inocente MARLA Blazing, OT 08/05/2023, 10:43 AM

## 2023-08-07 ENCOUNTER — Ambulatory Visit

## 2023-08-07 DIAGNOSIS — M6281 Muscle weakness (generalized): Secondary | ICD-10-CM

## 2023-08-07 DIAGNOSIS — M79641 Pain in right hand: Secondary | ICD-10-CM

## 2023-08-07 DIAGNOSIS — M19041 Primary osteoarthritis, right hand: Secondary | ICD-10-CM

## 2023-08-07 NOTE — Therapy (Signed)
 OUTPATIENT PHYSICAL THERAPY SHOULDER TREATMENT/        Patient Name: Shelby Henson MRN: [id number] DOB:01-Apr-1970, 53 y.o., female Today's Date: 08/07/2023  END OF SESSION:        Past Medical History:  Diagnosis Date   Anemia    Asthma    well controlled   Colon polyps    Complication of anesthesia    DDD (degenerative disc disease), lumbar    Depression    GERD (gastroesophageal reflux disease)    occ   Iron  deficiency anemia    Migraine    Morbid obesity (HCC)    Osteoarthritis    Osteoarthritis of right hip    PONV (postoperative nausea and vomiting)    with c-section x 1   Right rotator cuff tear 11/2022   Vitamin D  deficiency    Past Surgical History:  Procedure Laterality Date   BREAST CYST ASPIRATION Left    CESAREAN SECTION  1990   CESAREAN SECTION  1993   CESAREAN SECTION  1996   COLONOSCOPY N/A 09/22/2020   Procedure: COLONOSCOPY;  Surgeon: Janalyn Keene NOVAK, MD;  Location: [facility] ENDOSCOPY;  Service: Endoscopy;  Laterality: N/A;   LAPAROSCOPY  2018   LEEP  2005   benign pathology; Dr. Rolm   SHOULDER ACROMIOPLASTY Right 01/03/2023   Procedure: RIGHT SHOULDER ACROMIOPLASTY;  Surgeon: Genelle Standing, MD;  Location: [facility] ORS;  Service: Orthopedics;  Laterality: Right;   SHOULDER ARTHROSCOPY WITH ROTATOR CUFF REPAIR AND OPEN BICEPS TENODESIS Right 01/03/2023   Procedure: RIGHT SHOULDER ARTHROSCOPY WITH ROTATOR CUFF REPAIR AND  BICEPS TENODESIS;  Surgeon: Genelle Standing, MD;  Location: [facility] ORS;  Service: Orthopedics;  Laterality: Right;   SUPRACERVICAL ABDOMINAL HYSTERECTOMY  2010   leio/AUB; Dr. Rolm   TOTAL HIP ARTHROPLASTY Right 11/12/2018   Procedure: TOTAL HIP ARTHROPLASTY ANTERIOR APPROACH;  Surgeon: Leora Lynwood SAUNDERS, MD;  Location: [facility] ORS;  Service: Orthopedics;  Laterality: Right;   Patient Active Problem List   Diagnosis Date Noted   Severe persistent asthma without complication 03/29/2023   Nontraumatic incomplete tear of right  rotator cuff 01/03/2023   Right rotator cuff tendonitis 09/11/2022   Iron  deficiency anemia due to dietary causes 08/10/2022   Chronic right shoulder pain 05/11/2022   Morbid obesity (HCC) 01/05/2022   Rotator cuff impingement syndrome, right 09/29/2021   Biceps tendinitis, right 09/29/2021   Meralgia paresthetica of right side 03/29/2021   Psoas tendinitis, right hip 03/29/2021   History of total right hip arthroplasty 03/29/2021   Status post total replacement of right hip 02/19/2021   Meralgia paresthetica of right side 02/19/2021   Polyp of ascending colon 02/19/2021   Polyp of sigmoid colon 02/19/2021   Special screening for malignant neoplasms, colon    Polyp of ascending colon    Polyp of sigmoid colon    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 03/23/2020   Pain in right hip 01/12/2019   Vitamin D  deficiency 02/25/2018   Chronic pain 10/24/2015   Hot flashes 10/24/2015   Migraine without aura and without status migrainosus, not intractable 10/24/2015   Metabolic syndrome 10/24/2015   Dyslipidemia 10/24/2015   Allergic rhinitis, seasonal 05/17/2015   Chronic insomnia 08/08/2014   DDD (degenerative disc disease), lumbar 08/08/2014   Mood disorder (HCC) 08/08/2014   Gastroesophageal reflux disease without esophagitis 08/08/2014   H/O total hysterectomy 08/08/2014   Irritable bowel syndrome with diarrhea 08/08/2014   Osteoarthritis of right hip 08/08/2014   Adult BMI 30+ 08/08/2014  SBO (spina bifida occulta) 08/08/2014    PCP: Dorette Loron, MD   REFERRING PROVIDER: Elspeth Parker, MD orthopedics   REFERRING DIAG: Rt arthroscopic rotator cuff repair   THERAPY DIAG:  No diagnosis found.  Rationale for Evaluation and Treatment: Rehabilitation  ONSET DATE: 01/03/23  SUBJECTIVE:                                                                                                                                                                                       SUBJECTIVE STATEMENT: *** Hand dominance: Right  PERTINENT HISTORY: Shelby Henson is a 53yo F presents to outpatient physical therapy on postop day 4 status post right arthroscopic shoulder debridement of supraspinatus tendon, anterior glenoid labrum, subacromial depression, rotator cuff repair with Dr. Elspeth Parker.  Patient reports more than 1 year of insidious onset shoulder pain previously followed by sports medicine Dr. Alvia.  PAIN:  Are you having pain? 3/10 right shoulder: Worst pain was 8 out of 10 while sleeping overnight 2 nights ago.  PRECAUTIONS: Patient in sling immobilizer and nonweightbearing x 2 weeks.  Awaiting updates pending follow-up visit with orthopedics.   WEIGHT BEARING RESTRICTIONS: Yes, right upper extremity nonweightbearing  FALLS:  Has patient fallen in last 6 months?  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 PATIENT SURVEYS:  FOTO: 4  SENSATION: WNL  POSTURE: WNL  UPPER EXTREMITY ROM:    ROM Right eval Left eval  Shoulder flexion 90   Shoulder extension NT   Shoulder abduction NT   Shoulder internal rotation full   Shoulder external rotation NT   Elbow flexion full   Elbow extension full    Wrist flexion full    Wrist extension full    Wrist ulnar deviation full    Wrist radial deviation full    forearm pronation full    forearm supination full    (Blank rows = not tested)    PALPATION:  Tenderness at the posterior shoulder portal wound while adjusting/donning sling   *PROTOCOL LOCATED IN MEDIA SECTION OF EPIC   TODAY'S TREATMENT: DATE: 08/07/23  Physical therapy treatment session today consisted of completing  assessment of goals and administration of testing as demonstrated and documented in flow sheet, treatment, and goals section of this note. Addition treatments may be found below.    Manual therapy: (supine)  Gentle grade 1-2 GH joint mobs (inf/PA/AP) x several min  TherEx: Gentle PROM right shoulder flex, abduction, ER, IR 10x  with prolonged holds  Wall stretch:  Scarecrows 10x  Abduction snow angels 10x Tricep stretch 10x Modified wall cat cow 10x for scapular stabilization  Seated: Bicep stretch 30 seconds Shoulder adduction/abduction 10x IR hands on knees to hands behind back 10x   Trigger Point Dry Needling  Subsequent Treatment: Instructions provided previously at initial dry needling treatment.   Patient Verbal Consent Given: Yes Education Handout Provided: Previously Provided Muscles Treated: R bicep and upper trap  Electrical Stimulation Performed: No Treatment Response/Outcome: excellent twitch response      PATIENT EDUCATION: Education details: See above- extensive education this visit  Person educated: patient  Education method: Research scientist (medical), deliberate practice, positive reinforcement, explicit instruction, establish rules,  Education comprehension: adequate   HOME EXERCISE PROGRAM: Access Code: [email or web address] URL: [email or web address] Date: 04/17/2023 Prepared by: Heleena Miceli  Exercises - Seated Shoulder Flexion Full Range  - 1 x daily - 7 x weekly - 2 sets - 10 reps - 5 hold - Seated Shoulder Abduction  - 1 x daily - 7 x weekly - 2 sets - 10 reps - 5 hold - Seated Shoulder External Rotation  - 1 x daily - 7 x weekly - 2 sets - 10 reps - 5 hold - Seated Scapular Retraction  - 1 x daily - 7 x weekly - 2 sets - 10 reps - 5 hold - Supine Shoulder Flexion with Dowel  - 1 x daily - 7 x weekly - 2 sets - 10 reps - 5 hold - Supine Shoulder External Rotation in Abduction  - 1 x daily - 7 x weekly - 2 sets - 10 reps - 5 hold - Supine  Shoulder Abduction AROM  - 1 x daily - 7 x weekly - 2 sets - 10 reps - 5 hold Access Code: [email or web address] URL: [email or web address] Date: 02/04/2023 Prepared by: Rasheedah Reis  Exercises - Supine Shoulder Flexion AAROM with Dowel  - 1 x daily - 7 x weekly - 2 sets - 10 reps - 5 hold - Supine Shoulder Abduction AAROM with Dowel  - 1 x daily - 7 x weekly - 2 sets - 10 reps - 5 hold - Supine Shoulder External Rotation with Dowel  - 1 x daily - 7 x weekly - 2 sets - 10 reps - 5 hold - Seated Scapular Retraction  - 1 x daily - 7 x weekly - 2 sets - 10 reps - 5 hold  ASSESSMENT:  CLINICAL IMPRESSION: *** Pt will continue to benefit from skilled therapy to address remaining deficits in order to improve overall QoL and return to PLOF.      OBJECTIVE IMPAIRMENTS: decreased activity tolerance, decreased endurance, decreased knowledge of condition, decreased ROM, decreased strength, increased muscle spasms, impaired flexibility, and improper body mechanics.   ACTIVITY LIMITATIONS: carrying, lifting, bathing, toileting, dressing, self feeding, reach over head, and hygiene/grooming  PARTICIPATION LIMITATIONS: meal prep, cleaning, laundry, driving, shopping, community activity, occupation, and yard work  PERSONAL FACTORS: Age, Behavior pattern, Education, Fitness, Past/current experiences, Profession, Social background, Time since onset of injury/illness/exacerbation, and Transportation are also affecting patient's functional outcome.   REHAB POTENTIAL: Excellent  CLINICAL DECISION MAKING: Stable/uncomplicated  EVALUATION COMPLEXITY: Low   GOALS: Goals reviewed with patient? No  SHORT TERM GOALS: Target date: 04/30/2023  Pt to demonstrate Rt shoulder P/ROM flexion to >120 degrees, ABDCT >90 degrees, ER >45 degrees to improve ability to perform ADL/selfcare.  Baseline: 1/22: flexion: 70, abduction  38, scaption 71 3/4: MET Goal status:MET  2.  Pt to report successful HEP performance  and successful adherence to postoperative restrictions.  Baseline: HEP compliant Goal status:MET  3.  Pt to demonstrate 3/5 Rt shoulder MMT to 90 degrees of flexion and 90 degrees of abduction.  Baseline: unable 1/22: not able to due to protocol 6/25; met Goal status: iMET   LONG TERM GOALS: Target date: 08/21/2023    Pt to improve FOTO survey goal by >60 points to indicate a monstrous reduction in difficulty of Rt limb use in a variety of questionable activities.  Baseline: 4 1/22: 44 Goal status: Deferred   2.  Pt to demonstrate 5/5 Rt shoulder flexion, abduction, ER, IR, extension; 5/5 Rt elbow flexion/extension  Baseline: 1/22: deferred due to protocol 3/4: 3/5 strength  4/1: flexion 4/5, extension 4/5 abduction 3/5 5/14: Flexion 4/5, Extension: 4+/5, Abduction: 3*/5; pt still reporting significant pain in the shoulder flexion/abduction. 6/25: flexion 4+/5 abduction 4-/5 with pain, extension 5/5; IR 4+ ER 3+ Goal status: Ongoing   3.  Pt to demonstrate >155 degrees Rt shoulder A/ROM without end-range pain>2/10.  Baseline: 1/22: flexion: 70, abduction 38, scaption 71 3/4: flexion: 115 abduction: 92 scaption:  120  4/1: flexion: 131 abduction: 138; scaption: 150 5/14: Flexion: 150, Abduction: 170, Scaption: 150; 2/10 pain at the end range feeling stuck Goal status:MET  4.  Pt to demonstrate ability to perform gym-type exercises of resistance and aerobic natures without pain exacerbation in shoulder to allow for return to regular exercise at DC.  Baseline: 1/22: not released from protocol yet 3/4: no yet to this point of protocol  6/25: has not been to the gym due to secondary health issue  Goal status: Ongoing   5   Patient will decrease Quick DASH score by > 8 points demonstrating reduced self-reported upper extremity disability. Baseline: 4/1: 37%  5/14: 25% 6/25: 20%  Goal status: MET, Progressed  PLAN:  PT FREQUENCY: 1-2x/week  PT DURATION: 4 weeks   PLANNED  INTERVENTIONS: 97110-Therapeutic exercises, 97530- Therapeutic activity, 97112- Neuromuscular re-education, 97535- Self Care, [id number]- Manual therapy, 97014- Electrical stimulation (unattended), [phone]- Electrical stimulation (manual), Patient/Family education, and Moist heat  PLAN FOR NEXT SESSION: strengthening, pain reduction   Tilia Faso  Leopoldo, PT, DPT Physical Therapist - [facility]  Outpatient Physical Therapy- Main Campus [phone]    08/07/23, 9:49 AM

## 2023-08-08 ENCOUNTER — Ambulatory Visit

## 2023-08-08 DIAGNOSIS — M6281 Muscle weakness (generalized): Secondary | ICD-10-CM | POA: Diagnosis not present

## 2023-08-08 DIAGNOSIS — M25511 Pain in right shoulder: Secondary | ICD-10-CM

## 2023-08-08 DIAGNOSIS — M25611 Stiffness of right shoulder, not elsewhere classified: Secondary | ICD-10-CM

## 2023-08-09 ENCOUNTER — Ambulatory Visit (HOSPITAL_BASED_OUTPATIENT_CLINIC_OR_DEPARTMENT_OTHER): Admitting: Orthopaedic Surgery

## 2023-08-09 DIAGNOSIS — M75111 Incomplete rotator cuff tear or rupture of right shoulder, not specified as traumatic: Secondary | ICD-10-CM | POA: Diagnosis not present

## 2023-08-09 MED ORDER — TRIAMCINOLONE ACETONIDE 40 MG/ML IJ SUSP
80.0000 mg | INTRAMUSCULAR | Status: AC | PRN
  Administered 2023-08-09: 80 mg via INTRA_ARTICULAR

## 2023-08-09 MED ORDER — LIDOCAINE HCL 1 % IJ SOLN
4.0000 mL | INTRAMUSCULAR | Status: AC | PRN
  Administered 2023-08-09: 4 mL

## 2023-08-09 NOTE — Progress Notes (Signed)
 Post Operative Evaluation     Procedure/Date of Surgery: Right rotator cuff repair with collagen patch augmentation 12/5   Interval History:  . Presents the above procedure.  She is experiencing some pain deep in the joint today.  This is consistent with capsulitis  PMH/PSH/Family History/Social History/Meds/Allergies:    Past Medical History:  Diagnosis Date  . Anemia   . Asthma    well controlled  . Colon polyps   . Complication of anesthesia   . DDD (degenerative disc disease), lumbar   . Depression   . GERD (gastroesophageal reflux disease)    occ  . Iron  deficiency anemia   . Migraine   . Morbid obesity (HCC)   . Osteoarthritis   . Osteoarthritis of right hip   . PONV (postoperative nausea and vomiting)    with c-section x 1  . Right rotator cuff tear 11/2022  . Vitamin D  deficiency    Past Surgical History:  Procedure Laterality Date  . BREAST CYST ASPIRATION Left   . CESAREAN SECTION  1990  . CESAREAN SECTION  1993  . CESAREAN SECTION  1996  . COLONOSCOPY N/A 09/22/2020   Procedure: COLONOSCOPY;  Surgeon: Janalyn Keene NOVAK, MD;  Location[facility] ENDOSCOPY;  Service: Endoscopy;  Laterality: N/A;  . LAPAROSCOPY  2018  . LEEP  2005   benign pathology; Dr. Rolm  . SHOULDER ACROMIOPLASTY Right 01/03/2023   Procedure: RIGHT SHOULDER ACROMIOPLASTY;  Surgeon: Genelle Standing, MD;  Location: [facility] ORS;  Service: Orthopedics;  Laterality: Right;  . SHOULDER ARTHROSCOPY WITH ROTATOR CUFF REPAIR AND OPEN BICEPS TENODESIS Right 01/03/2023   Procedure: RIGHT SHOULDER ARTHROSCOPY WITH ROTATOR CUFF REPAIR AND  BICEPS TENODESIS;  Surgeon: Genelle Standing, MD;  Location: [facility] ORS;  Service: Orthopedics;  Laterality: Right;  . SUPRACERVICAL ABDOMINAL HYSTERECTOMY  2010   leio/AUB; Dr. Rolm  . TOTAL HIP ARTHROPLASTY Right 11/12/2018   Procedure: TOTAL HIP ARTHROPLASTY ANTERIOR APPROACH;  Surgeon: Leora Lynwood SAUNDERS, MD;  Location: [facility] ORS;   Service: Orthopedics;  Laterality: Right;   Social History   Socioeconomic History  . Marital status: Divorced    Spouse name: Not on file  . Number of children: 3  . Years of education: Not on file  . Highest education level: Associate degree: academic program  Occupational History  . Not on file  Tobacco Use  . Smoking status: Never  . Smokeless tobacco: Never  Vaping Use  . Vaping status: Never Used  Substance and Sexual Activity  . Alcohol use: Yes    Alcohol/week: 0.0 standard drinks of alcohol    Comment: occasionally  . Drug use: No  . Sexual activity: Yes    Birth control/protection: Surgical    Comment: Hysterectomy  Other Topics Concern  . Not on file  Social History Narrative  . Not on file   Social Drivers of Health   Financial Resource Strain: Medium Risk (03/26/2023)   Overall Financial Resource Strain (CARDIA)   . Difficulty of Paying Living Expenses: Somewhat hard  Food Insecurity: Patient Declined (03/26/2023)   Hunger Vital Sign   . Worried About Programme researcher, broadcasting/film/video in the Last Year: Patient declined   . Ran Out of Food in the Last Year: Patient declined  Transportation Needs: No Transportation Needs (03/26/2023)   PRAPARE - Transportation   .  Lack of Transportation (Medical): No   . Lack of Transportation (Non-Medical): No  Physical Activity: Unknown (03/26/2023)   Exercise Vital Sign   . Days of Exercise per Week: 0 days   . Minutes of Exercise per Session: Not on file  Recent Concern: Physical Activity - Inactive (03/26/2023)   Exercise Vital Sign   . Days of Exercise per Week: 0 days   . Minutes of Exercise per Session: 90 min  Stress: Stress Concern Present (03/26/2023)   [employer] of Occupational Health - Occupational Stress Questionnaire   . Feeling of Stress : To some extent  Social Connections: Socially Isolated (03/26/2023)   Social Connection and Isolation Panel   . Frequency of Communication with Friends and Family: More than  three times a week   . Frequency of Social Gatherings with Friends and Family: Three times a week   . Attends Religious Services: Never   . Active Member of Clubs or Organizations: No   . Attends Banker Meetings: Not on file   . Marital Status: Divorced   Family History  Problem Relation Age of Onset  . Depression Mother   . Bipolar disorder Brother   . Colon cancer Maternal Grandfather        not sure of age  . Breast cancer Neg Hx    Allergies  Allergen Reactions  . Celecoxib Hives  . Prednisone      intolerance, makes her angry. Makes her angry  . Topamax  [Topiramate ]     Mood changes  . Zolpidem     Mood changes  . Codeine Rash  . Penicillins Rash    Did it involve swelling of the face/tongue/throat, SOB, or low BP? No Did it involve sudden or severe rash/hives, skin peeling, or any reaction on the inside of your mouth or nose? Yes Did you need to seek medical attention at a hospital or doctor's office? Yes When did it last happen? More than 20 years ago       If all above answers are "NO", may proceed with cephalosporin use.    Current Outpatient Medications  Medication Sig Dispense Refill  . albuterol  (PROVENTIL ) (2.5 MG/3ML) 0.083% nebulizer solution Take 3 mLs (2.5 mg total) by nebulization every 6 (six) hours as needed for wheezing or shortness of breath 75 mL 0  . Albuterol -Budesonide  (AIRSUPRA ) 90-80 MCG/ACT AERO Inhale 1-2 puffs into the lungs as needed (wheezing). 10.7 g 5  . chlorpheniramine-HYDROcodone  (TUSSIONEX) 10-8 MG/5ML Take 5 mLs by mouth every 12 (twelve) hours as needed for cough (will cause drowsiness.). 115 mL 0  . desvenlafaxine  (PRISTIQ ) 50 MG 24 hr tablet Take 1 tablet (50 mg total) by mouth daily. 90 tablet 1  . diclofenac  Sodium (VOLTAREN ) 1 % GEL Apply 2 grams topically 4 (four) times daily. 100 g 1  . famotidine  (PEPCID ) 40 MG tablet Take 1 tablet (40 mg total) by mouth daily. 90 tablet 1  . Fluticasone -Umeclidin-Vilant (TRELEGY  ELLIPTA) 200-62.5-25 MCG/ACT AEPB Inhale 1 puff into the lungs daily. (Patient taking differently: Inhale 1 puff into the lungs at bedtime.) 60 each 6  . Fluticasone -Umeclidin-Vilant (TRELEGY ELLIPTA ) 200-62.5-25 MCG/ACT AEPB Inhale 1 puff into the lungs daily. 180 each 6  . gabapentin  (NEURONTIN ) 300 MG capsule Take 3 capsules (900 mg total) by mouth at bedtime. 270 capsule 1  . Galcanezumab -gnlm (EMGALITY ) 120 MG/ML SOAJ Inject 1 mL into the skin every 30 (thirty) days. 1 mL 5  . lidocaine  (LIDODERM ) 5 % Place 2 patches onto  the skin daily. Remove & Discard patch within 12 hours or as directed by MD 60 patch 2  . pantoprazole  (PROTONIX ) 40 MG tablet Take 1 tablet (40 mg total) by mouth daily. (Patient taking differently: Take 40 mg by mouth daily as needed.) 30 tablet 1  . prazosin  (MINIPRESS ) 2 MG capsule Take 1 capsule (2 mg total) by mouth at bedtime. 90 capsule 0   No current facility-administered medications for this visit.   No results found.   Review of Systems:   A ROS was performed including pertinent positives and negatives as documented in the HPI.   Musculoskeletal Exam:    There were no vitals taken for this visit.  Right shoulder incisions are well-appearing without erythema or drainage.  Passive range of motion as well as active range of motion is to 135 degrees in the standing position against gravity.  External rotation at side is 30 degrees 2+ radial pulse.  Distal neurosensory exam is intact.  Sling is well-fitting after adjustment  Right hip with positive Trendelenburg gait and pain over the trochanter with pain with resisted abduction of the right leg.  There is 3 degrees internal/external rotation without pain.  Distal neurosensory exam is intact  Imaging:      I personally reviewed and interpreted the radiographs.   Assessment:   Status post right shoulder rotator cuff repair with collagen patch augmentation.  At this time she is continuing to improve  although is experiencing some pain in the shoulder joint capsule.  Given this I have recommended 1 additional ultrasound-guided injection and I will plan to see her back in 2 months for reassessment  - Right shoulder ultrasound-guided injection provided after verbal consent obtained    Procedure Note  Patient: Shelby Henson             Date of Birth: 10/20/1970           MRN: [id number]             Visit Date: 08/09/2023  Procedures: Visit Diagnoses: No diagnosis found.  Large Joint Inj: R glenohumeral on 08/09/2023 12:59 PM Indications: pain Details: 22 G 1.5 in needle, ultrasound-guided anterior approach  Arthrogram: No  Medications: 4 mL lidocaine  1 %; 80 mg triamcinolone  acetonide 40 MG/ML Outcome: tolerated well, no immediate complications Procedure, treatment alternatives, risks and benefits explained, specific risks discussed. Consent was given by the patient. Immediately prior to procedure a time out was called to verify the correct patient, procedure, equipment, support staff and site/side marked as required. Patient was prepped and draped in the usual sterile fashion.         I personally saw and evaluated the patient, and participated in the management and treatment plan.  Elspeth Parker, MD Attending Physician, Orthopedic Surgery  This document was dictated using Dragon voice recognition software. A reasonable attempt at proof reading has been made to minimize errors.

## 2023-08-09 NOTE — Therapy (Signed)
 OUTPATIENT OCCUPATIONAL THERAPY ORTHO TREATMENT NOTE  Patient Name: Shelby Henson MRN: [id number] DOB:05-02-1970, 53 y.o., female Today's Date: 08/09/2023  PCP: Dr. Dorette Loron REFERRING PROVIDER: Dr. Elspeth Parker (ortho)  END OF SESSION:  OT End of Session - 08/09/23 0841     Visit Number 2    Number of Visits 12    Date for OT Re-Evaluation 09/13/23    OT Start Time 1320    OT Stop Time 1400    OT Time Calculation (min) 40 min    Activity Tolerance Patient tolerated treatment well    Behavior During Therapy WFL for tasks assessed/performed          Past Medical History:  Diagnosis Date   Anemia    Asthma    well controlled   Colon polyps    Complication of anesthesia    DDD (degenerative disc disease), lumbar    Depression    GERD (gastroesophageal reflux disease)    occ   Iron  deficiency anemia    Migraine    Morbid obesity (HCC)    Osteoarthritis    Osteoarthritis of right hip    PONV (postoperative nausea and vomiting)    with c-section x 1   Right rotator cuff tear 11/2022   Vitamin D  deficiency    Past Surgical History:  Procedure Laterality Date   BREAST CYST ASPIRATION Left    CESAREAN SECTION  1990   CESAREAN SECTION  1993   CESAREAN SECTION  1996   COLONOSCOPY N/A 09/22/2020   Procedure: COLONOSCOPY;  Surgeon: Janalyn Keene NOVAK, MD;  Location: [facility];  Service: Endoscopy;  Laterality: N/A;   LAPAROSCOPY  2018   LEEP  2005   benign pathology; Dr. Rolm   SHOULDER ACROMIOPLASTY Right 01/03/2023   Procedure: RIGHT SHOULDER ACROMIOPLASTY;  Surgeon: Parker Elspeth, MD;  Location: [facility];  Service: Orthopedics;  Laterality: Right;   SHOULDER ARTHROSCOPY WITH ROTATOR CUFF REPAIR AND OPEN BICEPS TENODESIS Right 01/03/2023   Procedure: RIGHT SHOULDER ARTHROSCOPY WITH ROTATOR CUFF REPAIR AND  BICEPS TENODESIS;  Surgeon: Parker Elspeth, MD;  Location: [facility];  Service: Orthopedics;  Laterality: Right;   SUPRACERVICAL ABDOMINAL  HYSTERECTOMY  2010   leio/AUB; Dr. Rolm   TOTAL HIP ARTHROPLASTY Right 11/12/2018   Procedure: TOTAL HIP ARTHROPLASTY ANTERIOR APPROACH;  Surgeon: Leora Lynwood SAUNDERS, MD;  Location: [facility];  Service: Orthopedics;  Laterality: Right;   Patient Active Problem List   Diagnosis Date Noted   Severe persistent asthma without complication 03/29/2023   Nontraumatic incomplete tear of right rotator cuff 01/03/2023   Right rotator cuff tendonitis 09/11/2022   Iron  deficiency anemia due to dietary causes 08/10/2022   Chronic right shoulder pain 05/11/2022   Morbid obesity (HCC) 01/05/2022   Rotator cuff impingement syndrome, right 09/29/2021   Biceps tendinitis, right 09/29/2021   Meralgia paresthetica of right side 03/29/2021   Psoas tendinitis, right hip 03/29/2021   History of total right hip arthroplasty 03/29/2021   Status post total replacement of right hip 02/19/2021   Meralgia paresthetica of right side 02/19/2021   Polyp of ascending colon 02/19/2021   Polyp of sigmoid colon 02/19/2021   Special screening for malignant neoplasms, colon    Polyp of ascending colon    Polyp of sigmoid colon    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 03/23/2020   Pain in right hip 01/12/2019   Vitamin D  deficiency 02/25/2018   Chronic pain 10/24/2015   Hot flashes 10/24/2015  Migraine without aura and without status migrainosus, not intractable 10/24/2015   Metabolic syndrome 10/24/2015   Dyslipidemia 10/24/2015   Allergic rhinitis, seasonal 05/17/2015   Chronic insomnia 08/08/2014   DDD (degenerative disc disease), lumbar 08/08/2014   Mood disorder (HCC) 08/08/2014   Gastroesophageal reflux disease without esophagitis 08/08/2014   H/O total hysterectomy 08/08/2014   Irritable bowel syndrome with diarrhea 08/08/2014   Osteoarthritis of right hip 08/08/2014   Adult BMI 30+ 08/08/2014   SBO (spina bifida occulta) 08/08/2014   ONSET DATE: June 2024  REFERRING DIAG:  M19.041 (ICD-10-CM) - Arthritis of right hand   THERAPY DIAG:  Muscle weakness (generalized)  Arthritis of right hand  Pain in right hand  Rationale for Evaluation and Treatment: Rehabilitation  SUBJECTIVE:  SUBJECTIVE STATEMENT: Pt reports the ionto tx and soft tissue massage felt good to her hand at previous session. Pt accompanied by: self  PERTINENT HISTORY: Hx of R frozen shoulder post rotator cuff repair, arthritis in the hand, and palmar inflammation/thickening of flexor tendons in the palm.  OT encouraged pt inquire with Dr. Genelle about whether this could be Dupyutren's contracture.  Pt presents today with c/o of pain in R hand with Dx of arthritis.  Pain is specifically palmar side at the thenar eminence.  Pt reports pain has been constant for at least the last year.    PRECAUTIONS: General joint protection/cumulative trauma prevention   RED FLAGS: None   WEIGHT BEARING RESTRICTIONS: No  PAIN:  Are you having pain? Yes: NPRS scale: 2/10 pain at rest, up to 6-7/10 with activity Pain location: R hand/thenar eminence Pain description: achy Aggravating factors: general use of the hand, holding eating utensils, pinching Relieving factors: heat, massage, soft neoprene thumb spica  FALLS: Has patient fallen in last 6 months? 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  NEXT MD VISIT: Return to Dr. Genelle on Fri 08/09/23 for cortisone shot in the R shoulder   OBJECTIVE:  Note: Objective measures were completed at Evaluation unless otherwise noted.  HAND DOMINANCE: Right  ADLs: Difficulty opening containers and drink bottles, lifting heavy pots/pans, picking up grandson (18 months), computer/mouse use (work related tasks), writing/filling out forms,   FUNCTIONAL OUTCOME MEASURES: MAM-20 for musculoskeletal conditions:  67/80  UPPER EXTREMITY ROM:  Full ROM in R hand digits, but pain with thumb flexion.  Able to oppose all digits to thumb in R hand, but pain with 4th and 5th digit opposition to thumb.  UPPER EXTREMITY MMT:    MMT Right eval Left eval  Shoulder flexion    Shoulder abduction    Shoulder adduction    Shoulder extension    Shoulder internal rotation    Shoulder external rotation    Middle trapezius    Lower trapezius    Elbow flexion    Elbow extension    Wrist flexion 5 5  Wrist extension 5 5  Wrist ulnar deviation 5 5  Wrist radial deviation 5 5  Wrist pronation    Wrist supination    (Blank rows = not tested)  HAND FUNCTION: Grip strength: Right: 35 lbs; Left: 45 lbs, Lateral pinch: Right: 11 lbs, Left: 14 lbs, and 3 point pinch: Right: 8 lbs, Left: 10 lbs  COORDINATION: 9 Hole Peg test: Right: 18 sec; Left: 19 sec  SENSATION: No tingling/numbness in R hand.  EDEMA: No visible edema  COGNITION: Overall cognitive status: Within functional limits for tasks assessed  OBSERVATIONS:  -Pt pleasant, cooperative, eager to work towards goals in OT poc.  -Thickened palmar/flexor tendons; encouraged pt to follow up with MD on Friday to inquire about whether this may be Dupuytren's contracture.  TREATMENT DATE: 08/07/23 Moist heat applied to R hand, used intermittently throughout session for pain management/muscle relaxation simultaneous to activities noted below.  Iontophoresis with dexamethasone , 1.5 cc (small patch): tx time: 22 min.  (2 of 10 planned treatments completed) R volar thumb/thenar eminence, 40 mA/min, current amplitude 2.0 mA -Skin check performed; small blister noted beneath site of electrode.  Will continue to monitor.  Manual Therapy: -Soft tissue massage with massage cream + Biofreeze for pain management/muscle relaxation to palmar hand, thenar eminence, and thumb abductors  Therapeutic Exercise: -Issued pink theraputty and instructed in gross grasping,  lateral, and 3 point pinching, to be completed within pain free range -Advised on performing above exercises slowly and maintaining low reps, and monitoring for any increases in pain.  -Performed passive stretches for elongating thenar musculature -Gentle joint distraction during RD/UD stretch   PATIENT EDUCATION: Education details: HEP Person educated: Patient Education method: Explanation and Verbal cues Education comprehension: verbalized understanding  HOME EXERCISE PROGRAM: -Issued small stress cushion for light pinching/gripping.  Will plan to issue yellow or pink putty in follow up visit.   -Pink theraputty for low reps of gripping/pinching to tolerance  GOALS: Goals reviewed with patient? Yes  SHORT TERM GOALS: Target date: 08/26/23  Pt will be indep to perform HEP for improving R hand strength for ADL/IADL/work related tasks and to maintain tissue extensibility in thickened flexor palmar tendons. Baseline: Eval: HEP initiated at eval; additional exercises will be added in upcoming sessions Goal status: INITIAL  2.  Pt will be indep to verbalize and implement 2-3 joint protection/cumulative trauma prevention strategies to reduce pain in the R hand.  Baseline: Eval: Education not yet initiated Goal status: INITIAL  LONG TERM GOALS: Target date: 09/13/23  Pt will increase MAM-20 score for musculoskeletal conditions by 9 or more points to indicate improvement in self perceived functional use of the R hand for daily tasks.   Baseline: Eval: 67/80 Goal status: INITIAL  2.  Pt will increase R grip strength by 10 or more lbs to improve ability to hold and carry heavy ADL supplies. Baseline: Eval: R 35 lbs (L 45 lbs) Goal status: INITIAL  3.  Pt will increase R lateral pinch strength by 5 or more lbs to ease ability to open drink bottles. Baseline: Eval: R 11 lbs (L 14 lbs) Goal status: INITIAL  4.  Pt will tolerate manual therapy, therapeutic modalities, and exercises to  decrease pain in R hand to a reported 0/10 pain at rest and 3/10 pain or      less with activity.  Baseline: Eval: 4/10 pain at rest, 6-7/10 pain with activity Goal status: INITIAL   ASSESSMENT:  CLINICAL IMPRESSION: 2nd iontophoresis tx completed today with good tolerance.  Small blister noted post tx beneath site of electrode.  Will continue to monitor.  Good tolerance to manual therapy and therapeutic exercises noted above.  Pt will continue to benefit from skilled OT to address above noted goals, with pt's primary goals being to increase strength and decrease pain in her R dominant hand in order to improve indep and tolerance to daily tasks.  Pt in agreement with OT poc.  PERFORMANCE DEFICITS: in functional skills including ADLs, IADLs, strength, pain,  fascial restrictions, body mechanics, decreased knowledge of precautions, decreased knowledge of use of DME, and UE functional use, and psychosocial skills including coping strategies, environmental adaptation, habits, and routines and behaviors.   IMPAIRMENTS: are limiting patient from ADLs, IADLs, rest and sleep, work, leisure, and social participation.   COMORBIDITIES: has co-morbidities such as OA, R rotator cuff tear/repair, asthma that affects occupational performance. Patient will benefit from skilled OT to address above impairments and improve overall function.  MODIFICATION OR ASSISTANCE TO COMPLETE EVALUATION: No modification of tasks or assist necessary to complete an evaluation.  OT OCCUPATIONAL PROFILE AND HISTORY: Detailed assessment: Review of records and additional review of physical, cognitive, psychosocial history related to current functional performance.  CLINICAL DECISION MAKING: Moderate - several treatment options, min-mod task modification necessary  REHAB POTENTIAL: Good  EVALUATION COMPLEXITY: Moderate      PLAN:  OT FREQUENCY: 2x/week  OT DURATION: 6 weeks  PLANNED INTERVENTIONS: 97168 OT  Re-evaluation, 97535 self care/ADL training, 02889 therapeutic exercise, 97530 therapeutic activity, 97140 manual therapy, 97035 ultrasound, 97018 paraffin, 02989 moist heat, 97010 cryotherapy, 97034 contrast bath, 97033 iontophoresis, 97760 Orthotic Initial, 97763 Orthotic/Prosthetic subsequent, passive range of motion, psychosocial skills training, energy conservation, coping strategies training, patient/family education, and DME and/or AE instructions  RECOMMENDED OTHER SERVICES: None at this time  CONSULTED AND AGREED WITH PLAN OF CARE: Patient  PLAN FOR NEXT SESSION: iontophoresis, HEP progression, manual therapy  Inocente Blazing, MS, OTR/L   Inocente MARLA Blazing, OT 08/09/2023, 8:43 AM

## 2023-08-10 DIAGNOSIS — F4322 Adjustment disorder with anxiety: Secondary | ICD-10-CM | POA: Diagnosis not present

## 2023-08-12 ENCOUNTER — Ambulatory Visit

## 2023-08-12 DIAGNOSIS — M79641 Pain in right hand: Secondary | ICD-10-CM

## 2023-08-12 DIAGNOSIS — M19041 Primary osteoarthritis, right hand: Secondary | ICD-10-CM

## 2023-08-12 DIAGNOSIS — M6281 Muscle weakness (generalized): Secondary | ICD-10-CM

## 2023-08-12 NOTE — Therapy (Signed)
 OUTPATIENT OCCUPATIONAL THERAPY ORTHO TREATMENT NOTE  Patient Name: Shelby Henson MRN: [id number] DOB:[birth year], 53 y.o., female Today's Date: 08/12/2023  PCP: Dr. Dorette Loron REFERRING PROVIDER: Dr. Elspeth Parker (ortho)  END OF SESSION:  OT End of Session - 08/12/23 1625     Visit Number 3    Number of Visits 12    Date for OT Re-Evaluation 09/13/23    OT Start Time 1150    OT Stop Time 1230    OT Time Calculation (min) 40 min    Activity Tolerance Patient tolerated treatment well    Behavior During Therapy WFL for tasks assessed/performed          Past Medical History:  Diagnosis Date   Anemia    Asthma    well controlled   Colon polyps    Complication of anesthesia    DDD (degenerative disc disease), lumbar    Depression    GERD (gastroesophageal reflux disease)    occ   Iron  deficiency anemia    Migraine    Morbid obesity (HCC)    Osteoarthritis    Osteoarthritis of right hip    PONV (postoperative nausea and vomiting)    with c-section x 1   Right rotator cuff tear 11/2022   Vitamin D  deficiency    Past Surgical History:  Procedure Laterality Date   BREAST CYST ASPIRATION Left    CESAREAN SECTION  1990   CESAREAN SECTION  1993   CESAREAN SECTION  1996   COLONOSCOPY N/A 09/22/2020   Procedure: COLONOSCOPY;  Surgeon: Janalyn Keene NOVAK, MD;  Location: [facility];  Service: Endoscopy;  Laterality: N/A;   LAPAROSCOPY  2018   LEEP  2005   benign pathology; Dr. Rolm   SHOULDER ACROMIOPLASTY Right 01/03/2023   Procedure: RIGHT SHOULDER ACROMIOPLASTY;  Surgeon: Parker Elspeth, MD;  Location: [facility];  Service: Orthopedics;  Laterality: Right;   SHOULDER ARTHROSCOPY WITH ROTATOR CUFF REPAIR AND OPEN BICEPS TENODESIS Right 01/03/2023   Procedure: RIGHT SHOULDER ARTHROSCOPY WITH ROTATOR CUFF REPAIR AND  BICEPS TENODESIS;  Surgeon: Parker Elspeth, MD;  Location: [facility];  Service: Orthopedics;  Laterality: Right;   SUPRACERVICAL ABDOMINAL  HYSTERECTOMY  2010   leio/AUB; Dr. Rolm   TOTAL HIP ARTHROPLASTY Right 11/12/2018   Procedure: TOTAL HIP ARTHROPLASTY ANTERIOR APPROACH;  Surgeon: Leora Lynwood SAUNDERS, MD;  Location: [facility];  Service: Orthopedics;  Laterality: Right;   Patient Active Problem List   Diagnosis Date Noted   Severe persistent asthma without complication 03/29/2023   Nontraumatic incomplete tear of right rotator cuff 01/03/2023   Right rotator cuff tendonitis 09/11/2022   Iron  deficiency anemia due to dietary causes 08/10/2022   Chronic right shoulder pain 05/11/2022   Morbid obesity (HCC) 01/05/2022   Rotator cuff impingement syndrome, right 09/29/2021   Biceps tendinitis, right 09/29/2021   Meralgia paresthetica of right side 03/29/2021   Psoas tendinitis, right hip 03/29/2021   History of total right hip arthroplasty 03/29/2021   Status post total replacement of right hip 02/19/2021   Meralgia paresthetica of right side 02/19/2021   Polyp of ascending colon 02/19/2021   Polyp of sigmoid colon 02/19/2021   Special screening for malignant neoplasms, colon    Polyp of ascending colon    Polyp of sigmoid colon    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 03/23/2020   Pain in right hip 01/12/2019   Vitamin D  deficiency 02/25/2018   Chronic pain 10/24/2015   Hot flashes 10/24/2015  Migraine without aura and without status migrainosus, not intractable 10/24/2015   Metabolic syndrome 10/24/2015   Dyslipidemia 10/24/2015   Allergic rhinitis, seasonal 05/17/2015   Chronic insomnia 08/08/2014   DDD (degenerative disc disease), lumbar 08/08/2014   Mood disorder (HCC) 08/08/2014   Gastroesophageal reflux disease without esophagitis 08/08/2014   H/O total hysterectomy 08/08/2014   Irritable bowel syndrome with diarrhea 08/08/2014   Osteoarthritis of right hip 08/08/2014   Adult BMI 30+ 08/08/2014   SBO (spina bifida occulta) 08/08/2014   ONSET DATE: June 2024  REFERRING DIAG:  M19.041 (ICD-10-CM) - Arthritis of right hand   THERAPY DIAG:  Muscle weakness (generalized)  Arthritis of right hand  Pain in right hand  Rationale for Evaluation and Treatment: Rehabilitation  SUBJECTIVE:  SUBJECTIVE STATEMENT: Pt reports that she tried working with the putty gently, but found that releasing her grip after each rep caused pain in her thumb. Pt accompanied by: self  PERTINENT HISTORY: Hx of R frozen shoulder post rotator cuff repair, arthritis in the hand, and palmar inflammation/thickening of flexor tendons in the palm.  OT encouraged pt inquire with Dr. Genelle about whether this could be Dupyutren's contracture.  Pt presents today with c/o of pain in R hand with Dx of arthritis.  Pain is specifically palmar side at the thenar eminence.  Pt reports pain has been constant for at least the last year.    PRECAUTIONS: General joint protection/cumulative trauma prevention   RED FLAGS: None   WEIGHT BEARING RESTRICTIONS: No  PAIN: 08/12/23: 3/10 pain R thumb  Are you having pain? Yes: NPRS scale: 2/10 pain at rest, up to 6-7/10 with activity Pain location: R hand/thenar eminence Pain description: achy Aggravating factors: general use of the hand, holding eating utensils, pinching Relieving factors: heat, massage, soft neoprene thumb spica  FALLS: Has patient fallen in last 6 months? 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  NEXT MD VISIT: Return to Dr. Genelle on Fri 08/09/23 for cortisone shot in the R shoulder   OBJECTIVE:  Note: Objective measures were completed at Evaluation unless otherwise noted.  HAND DOMINANCE: Right  ADLs: Difficulty opening containers and drink bottles, lifting heavy pots/pans, picking up grandson (18 months), computer/mouse use (work related tasks), writing/filling out forms,   FUNCTIONAL  OUTCOME MEASURES: MAM-20 for musculoskeletal conditions: 67/80  UPPER EXTREMITY ROM:  Full ROM in R hand digits, but pain with thumb flexion.  Able to oppose all digits to thumb in R hand, but pain with 4th and 5th digit opposition to thumb.  UPPER EXTREMITY MMT:    MMT Right eval Left eval  Shoulder flexion    Shoulder abduction    Shoulder adduction    Shoulder extension    Shoulder internal rotation    Shoulder external rotation    Middle trapezius    Lower trapezius    Elbow flexion    Elbow extension    Wrist flexion 5 5  Wrist extension 5 5  Wrist ulnar deviation 5 5  Wrist radial deviation 5 5  Wrist pronation    Wrist supination    (Blank rows = not tested)  HAND FUNCTION: Grip strength: Right: 35 lbs; Left: 45 lbs, Lateral pinch: Right: 11 lbs, Left: 14 lbs, and 3 point pinch: Right: 8 lbs, Left: 10 lbs  COORDINATION: 9 Hole Peg test: Right: 18 sec; Left: 19 sec  SENSATION: No tingling/numbness in R hand.  EDEMA: No visible edema  COGNITION: Overall cognitive status: Within functional limits for tasks assessed  OBSERVATIONS:  -Pt pleasant, cooperative, eager to work towards goals in OT poc.  -Thickened palmar/flexor tendons; encouraged pt to follow up with MD on Friday to inquire about whether this may be Dupuytren's contracture.  TREATMENT DATE: 08/12/23 Moist heat applied to R hand, used intermittently throughout session for pain management/muscle relaxation simultaneous to activities noted below.  Iontophoresis with dexamethasone , 1.5 cc (small patch): tx time: 22 min.  (2 of 10 planned treatments completed) R volar thumb/thenar eminence, 40 mA/min, current amplitude 2.0 mA -Skin check performed; small blister noted beneath site of electrode.  Will continue to monitor.  Manual Therapy: -Soft tissue massage with massage cream + Biofreeze for pain management/muscle relaxation to palmar hand, thenar eminence, and thumb abductors  Therapeutic  Exercise: -Performed passive stretches for elongating thenar musculature -Performed metacarpal spread stretches and instructed in wrist and digit ext, digit abd for lengthening thickened palmar tendons  -Gentle joint distraction during RD/UD stretch   PATIENT EDUCATION: Education details: HEP; hold on putty exercises if painful Person educated: Patient Education method: Explanation and Verbal cues Education comprehension: verbalized understanding  HOME EXERCISE PROGRAM: -Issued small stress cushion for light pinching/gripping.  Will plan to issue yellow or pink putty in follow up visit.   -Pink theraputty for low reps of gripping/pinching to tolerance  GOALS: Goals reviewed with patient? Yes  SHORT TERM GOALS: Target date: 08/26/23  Pt will be indep to perform HEP for improving R hand strength for ADL/IADL/work related tasks and to maintain tissue extensibility in thickened flexor palmar tendons. Baseline: Eval: HEP initiated at eval; additional exercises will be added in upcoming sessions Goal status: INITIAL  2.  Pt will be indep to verbalize and implement 2-3 joint protection/cumulative trauma prevention strategies to reduce pain in the R hand.  Baseline: Eval: Education not yet initiated Goal status: INITIAL  LONG TERM GOALS: Target date: 09/13/23  Pt will increase MAM-20 score for musculoskeletal conditions by 9 or more points to indicate improvement in self perceived functional use of the R hand for daily tasks.   Baseline: Eval: 67/80 Goal status: INITIAL  2.  Pt will increase R grip strength by 10 or more lbs to improve ability to hold and carry heavy ADL supplies. Baseline: Eval: R 35 lbs (L 45 lbs) Goal status: INITIAL  3.  Pt will increase R lateral pinch strength by 5 or more lbs to ease ability to open drink bottles. Baseline: Eval: R 11 lbs (L 14 lbs) Goal status: INITIAL  4.  Pt will tolerate manual therapy, therapeutic modalities, and exercises to decrease pain  in R hand to a reported 0/10 pain at rest and 3/10 pain or      less with activity.  Baseline: Eval: 4/10 pain at rest, 6-7/10 pain with activity Goal status: INITIAL   ASSESSMENT:  CLINICAL IMPRESSION: 3rd iontophoresis tx completed today with good tolerance.  Small blister still present beneath site of electrode.  Will continue to monitor.  Good tolerance to manual therapy and therapeutic exercises noted above.  Encouraged pt hold on putty exercises at this time d/t pain when releasing putty after each gripping rep.  Pt will continue to benefit from skilled OT to address above noted goals, with pt's primary goals being to increase strength and decrease pain in her R dominant hand in order to improve indep and tolerance to daily tasks.  Pt in agreement with OT poc.  PERFORMANCE DEFICITS: in functional skills including ADLs, IADLs, strength, pain, fascial restrictions, body mechanics, decreased knowledge of precautions, decreased knowledge of use of DME, and UE functional use, and psychosocial skills including coping strategies, environmental adaptation, habits, and routines and behaviors.   IMPAIRMENTS: are limiting patient from ADLs, IADLs, rest and sleep, work, leisure, and social participation.   COMORBIDITIES: has co-morbidities such as OA, R rotator cuff tear/repair, asthma that affects occupational performance. Patient will benefit from skilled OT to address above impairments and improve overall function.  MODIFICATION OR ASSISTANCE TO COMPLETE EVALUATION: No modification of tasks or assist necessary to complete an evaluation.  OT OCCUPATIONAL PROFILE AND HISTORY: Detailed assessment: Review of records and additional review of physical, cognitive, psychosocial history related to current functional performance.  CLINICAL DECISION MAKING: Moderate - several treatment options, min-mod task modification necessary  REHAB POTENTIAL: Good  EVALUATION COMPLEXITY:  Moderate      PLAN:  OT FREQUENCY: 2x/week  OT DURATION: 6 weeks  PLANNED INTERVENTIONS: 97168 OT Re-evaluation, 97535 self care/ADL training, 02889 therapeutic exercise, 97530 therapeutic activity, 97140 manual therapy, 97035 ultrasound, 97018 paraffin, 02989 moist heat, 97010 cryotherapy, 97034 contrast bath, 97033 iontophoresis, 97760 Orthotic Initial, 97763 Orthotic/Prosthetic subsequent, passive range of motion, psychosocial skills training, energy conservation, coping strategies training, patient/family education, and DME and/or AE instructions  RECOMMENDED OTHER SERVICES: None at this time  CONSULTED AND AGREED WITH PLAN OF CARE: Patient  PLAN FOR NEXT SESSION: iontophoresis, HEP progression, manual therapy  Inocente Blazing, MS, OTR/L   Inocente MARLA Blazing, OT 08/12/2023, 4:27 PM

## 2023-08-14 ENCOUNTER — Ambulatory Visit

## 2023-08-15 ENCOUNTER — Ambulatory Visit

## 2023-08-15 DIAGNOSIS — M79641 Pain in right hand: Secondary | ICD-10-CM

## 2023-08-15 DIAGNOSIS — M19041 Primary osteoarthritis, right hand: Secondary | ICD-10-CM

## 2023-08-15 DIAGNOSIS — M6281 Muscle weakness (generalized): Secondary | ICD-10-CM | POA: Diagnosis not present

## 2023-08-18 NOTE — Therapy (Signed)
 OUTPATIENT OCCUPATIONAL THERAPY ORTHO TREATMENT NOTE  Patient Name: Shelby Henson MRN: [id number] DOB:1970/05/28, 53 y.o., female Today's Date: 08/18/2023  PCP: Dr. Dorette Loron REFERRING PROVIDER: Dr. Elspeth Parker (ortho)  END OF SESSION:  OT End of Session - 08/18/23 1322     Visit Number 4    Number of Visits 12    Date for OT Re-Evaluation 09/13/23    OT Start Time 1400    OT Stop Time 1445    OT Time Calculation (min) 45 min    Activity Tolerance Patient tolerated treatment well    Behavior During Therapy WFL for tasks assessed/performed          Past Medical History:  Diagnosis Date   Anemia    Asthma    well controlled   Colon polyps    Complication of anesthesia    DDD (degenerative disc disease), lumbar    Depression    GERD (gastroesophageal reflux disease)    occ   Iron  deficiency anemia    Migraine    Morbid obesity (HCC)    Osteoarthritis    Osteoarthritis of right hip    PONV (postoperative nausea and vomiting)    with c-section x 1   Right rotator cuff tear 11/2022   Vitamin D  deficiency    Past Surgical History:  Procedure Laterality Date   BREAST CYST ASPIRATION Left    CESAREAN SECTION  1990   CESAREAN SECTION  1993   CESAREAN SECTION  1996   COLONOSCOPY N/A 09/22/2020   Procedure: COLONOSCOPY;  Surgeon: Janalyn Keene NOVAK, MD;  Location: [facility];  Service: Endoscopy;  Laterality: N/A;   LAPAROSCOPY  2018   LEEP  2005   benign pathology; Dr. Rolm   SHOULDER ACROMIOPLASTY Right 01/03/2023   Procedure: RIGHT SHOULDER ACROMIOPLASTY;  Surgeon: Parker Elspeth, MD;  Location: [facility];  Service: Orthopedics;  Laterality: Right;   SHOULDER ARTHROSCOPY WITH ROTATOR CUFF REPAIR AND OPEN BICEPS TENODESIS Right 01/03/2023   Procedure: RIGHT SHOULDER ARTHROSCOPY WITH ROTATOR CUFF REPAIR AND  BICEPS TENODESIS;  Surgeon: Parker Elspeth, MD;  Location: [facility];  Service: Orthopedics;  Laterality: Right;   SUPRACERVICAL ABDOMINAL  HYSTERECTOMY  2010   leio/AUB; Dr. Rolm   TOTAL HIP ARTHROPLASTY Right 11/12/2018   Procedure: TOTAL HIP ARTHROPLASTY ANTERIOR APPROACH;  Surgeon: Leora Lynwood SAUNDERS, MD;  Location: [facility];  Service: Orthopedics;  Laterality: Right;   Patient Active Problem List   Diagnosis Date Noted   Severe persistent asthma without complication 03/29/2023   Nontraumatic incomplete tear of right rotator cuff 01/03/2023   Right rotator cuff tendonitis 09/11/2022   Iron  deficiency anemia due to dietary causes 08/10/2022   Chronic right shoulder pain 05/11/2022   Morbid obesity (HCC) 01/05/2022   Rotator cuff impingement syndrome, right 09/29/2021   Biceps tendinitis, right 09/29/2021   Meralgia paresthetica of right side 03/29/2021   Psoas tendinitis, right hip 03/29/2021   History of total right hip arthroplasty 03/29/2021   Status post total replacement of right hip 02/19/2021   Meralgia paresthetica of right side 02/19/2021   Polyp of ascending colon 02/19/2021   Polyp of sigmoid colon 02/19/2021   Special screening for malignant neoplasms, colon    Polyp of ascending colon    Polyp of sigmoid colon    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 03/23/2020   Pain in right hip 01/12/2019   Vitamin D  deficiency 02/25/2018   Chronic pain 10/24/2015   Hot flashes 10/24/2015  Migraine without aura and without status migrainosus, not intractable 10/24/2015   Metabolic syndrome 10/24/2015   Dyslipidemia 10/24/2015   Allergic rhinitis, seasonal 05/17/2015   Chronic insomnia 08/08/2014   DDD (degenerative disc disease), lumbar 08/08/2014   Mood disorder (HCC) 08/08/2014   Gastroesophageal reflux disease without esophagitis 08/08/2014   H/O total hysterectomy 08/08/2014   Irritable bowel syndrome with diarrhea 08/08/2014   Osteoarthritis of right hip 08/08/2014   Adult BMI 30+ 08/08/2014   SBO (spina bifida occulta) 08/08/2014   ONSET DATE: June 2024  REFERRING DIAG:  M19.041 (ICD-10-CM) - Arthritis of right hand   THERAPY DIAG:  Muscle weakness (generalized)  Arthritis of right hand  Pain in right hand  Rationale for Evaluation and Treatment: Rehabilitation  SUBJECTIVE:  SUBJECTIVE STATEMENT: Pt reports thumb has been aching. Pt accompanied by: self  PERTINENT HISTORY: Hx of R frozen shoulder post rotator cuff repair, arthritis in the hand, and palmar inflammation/thickening of flexor tendons in the palm.  OT encouraged pt inquire with Dr. Genelle about whether this could be Dupyutren's contracture.  Pt presents today with c/o of pain in R hand with Dx of arthritis.  Pain is specifically palmar side at the thenar eminence.  Pt reports pain has been constant for at least the last year.    PRECAUTIONS: General joint protection/cumulative trauma prevention   RED FLAGS: None   WEIGHT BEARING RESTRICTIONS: No  PAIN: 08/15/23: 2-3/10 pain R thumb  Are you having pain? Yes: NPRS scale: 2/10 pain at rest, up to 6-7/10 with activity Pain location: R hand/thenar eminence Pain description: achy Aggravating factors: general use of the hand, holding eating utensils, pinching Relieving factors: heat, massage, soft neoprene thumb spica  FALLS: Has patient fallen in last 6 months? 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  NEXT MD VISIT: Return to Dr. Genelle on Fri 08/09/23 for cortisone shot in the R shoulder   OBJECTIVE:  Note: Objective measures were completed at Evaluation unless otherwise noted.  HAND DOMINANCE: Right  ADLs: Difficulty opening containers and drink bottles, lifting heavy pots/pans, picking up grandson (18 months), computer/mouse use (work related tasks), writing/filling out forms,   FUNCTIONAL OUTCOME MEASURES: MAM-20 for musculoskeletal conditions: 67/80  UPPER EXTREMITY ROM:  Full  ROM in R hand digits, but pain with thumb flexion.  Able to oppose all digits to thumb in R hand, but pain with 4th and 5th digit opposition to thumb.  UPPER EXTREMITY MMT:    MMT Right eval Left eval  Shoulder flexion    Shoulder abduction    Shoulder adduction    Shoulder extension    Shoulder internal rotation    Shoulder external rotation    Middle trapezius    Lower trapezius    Elbow flexion    Elbow extension    Wrist flexion 5 5  Wrist extension 5 5  Wrist ulnar deviation 5 5  Wrist radial deviation 5 5  Wrist pronation    Wrist supination    (Blank rows = not tested)  HAND FUNCTION: Grip strength: Right: 35 lbs; Left: 45 lbs, Lateral pinch: Right: 11 lbs, Left: 14 lbs, and 3 point pinch: Right: 8 lbs, Left: 10 lbs  COORDINATION: 9 Hole Peg test: Right: 18 sec; Left: 19 sec  SENSATION: No tingling/numbness in R hand.  EDEMA: No visible edema  COGNITION: Overall cognitive status: Within  functional limits for tasks assessed  OBSERVATIONS:  -Pt pleasant, cooperative, eager to work towards goals in OT poc.  -Thickened palmar/flexor tendons; encouraged pt to follow up with MD on Friday to inquire about whether this may be Dupuytren's contracture.  TREATMENT DATE: 08/15/23 Moist heat applied to R hand, used intermittently throughout session for pain management/muscle relaxation simultaneous to activities noted below.  Iontophoresis with dexamethasone , 1.5 cc (small patch): tx time: 22 min.  (4 of 10 planned treatments completed); applied small bandaid overtop small blister on thumb to protect from ionto application R volar thumb/thenar eminence, 40 mA/min, current amplitude 2.0 mA -Skin check performed; blister without change.  Will continue to monitor.  Manual Therapy: -Soft tissue massage with massage cream + Biofreeze for pain management/muscle relaxation to palmar hand, thenar eminence, and thumb abductors  Therapeutic Exercise: -Performed passive stretches  for elongating thenar musculature -Performed metacarpal spread stretches and instructed in wrist and digit ext, digit abd for lengthening thickened palmar tendons  -Gentle joint distraction during RD/UD stretch   PATIENT EDUCATION: Education details: HEP; hold on putty exercises if painful Person educated: Patient Education method: Explanation and Verbal cues Education comprehension: verbalized understanding  HOME EXERCISE PROGRAM: -Issued small stress cushion for light pinching/gripping.  Will plan to issue yellow or pink putty in follow up visit.   -Pink theraputty for low reps of gripping/pinching to tolerance  GOALS: Goals reviewed with patient? Yes  SHORT TERM GOALS: Target date: 08/26/23  Pt will be indep to perform HEP for improving R hand strength for ADL/IADL/work related tasks and to maintain tissue extensibility in thickened flexor palmar tendons. Baseline: Eval: HEP initiated at eval; additional exercises will be added in upcoming sessions Goal status: INITIAL  2.  Pt will be indep to verbalize and implement 2-3 joint protection/cumulative trauma prevention strategies to reduce pain in the R hand.  Baseline: Eval: Education not yet initiated Goal status: INITIAL  LONG TERM GOALS: Target date: 09/13/23  Pt will increase MAM-20 score for musculoskeletal conditions by 9 or more points to indicate improvement in self perceived functional use of the R hand for daily tasks.   Baseline: Eval: 67/80 Goal status: INITIAL  2.  Pt will increase R grip strength by 10 or more lbs to improve ability to hold and carry heavy ADL supplies. Baseline: Eval: R 35 lbs (L 45 lbs) Goal status: INITIAL  3.  Pt will increase R lateral pinch strength by 5 or more lbs to ease ability to open drink bottles. Baseline: Eval: R 11 lbs (L 14 lbs) Goal status: INITIAL  4.  Pt will tolerate manual therapy, therapeutic modalities, and exercises to decrease pain in R hand to a reported 0/10 pain at  rest and 3/10 pain or      less with activity.  Baseline: Eval: 4/10 pain at rest, 6-7/10 pain with activity Goal status: INITIAL   ASSESSMENT:  CLINICAL IMPRESSION: 4th iontophoresis tx completed today with good tolerance.  Small blister still present beneath site of electrode, but this site covered for ionto tx to avoid worsening.  Will continue to monitor.  Good tolerance to manual therapy and therapeutic exercises noted above.  Continue to encourage pt hold on putty exercises at this time d/t pain when releasing putty after each gripping rep.  Pt will continue to benefit from skilled OT to address above noted goals, with pt's primary goals being to increase strength and decrease pain in her R dominant hand in order to improve indep and tolerance  to daily tasks.  Pt in agreement with OT poc.  PERFORMANCE DEFICITS: in functional skills including ADLs, IADLs, strength, pain, fascial restrictions, body mechanics, decreased knowledge of precautions, decreased knowledge of use of DME, and UE functional use, and psychosocial skills including coping strategies, environmental adaptation, habits, and routines and behaviors.   IMPAIRMENTS: are limiting patient from ADLs, IADLs, rest and sleep, work, leisure, and social participation.   COMORBIDITIES: has co-morbidities such as OA, R rotator cuff tear/repair, asthma that affects occupational performance. Patient will benefit from skilled OT to address above impairments and improve overall function.  MODIFICATION OR ASSISTANCE TO COMPLETE EVALUATION: No modification of tasks or assist necessary to complete an evaluation.  OT OCCUPATIONAL PROFILE AND HISTORY: Detailed assessment: Review of records and additional review of physical, cognitive, psychosocial history related to current functional performance.  CLINICAL DECISION MAKING: Moderate - several treatment options, min-mod task modification necessary  REHAB POTENTIAL: Good  EVALUATION COMPLEXITY:  Moderate      PLAN:  OT FREQUENCY: 2x/week  OT DURATION: 6 weeks  PLANNED INTERVENTIONS: 97168 OT Re-evaluation, 97535 self care/ADL training, 02889 therapeutic exercise, 97530 therapeutic activity, 97140 manual therapy, 97035 ultrasound, 97018 paraffin, 02989 moist heat, 97010 cryotherapy, 97034 contrast bath, 97033 iontophoresis, 97760 Orthotic Initial, 97763 Orthotic/Prosthetic subsequent, passive range of motion, psychosocial skills training, energy conservation, coping strategies training, patient/family education, and DME and/or AE instructions  RECOMMENDED OTHER SERVICES: None at this time  CONSULTED AND AGREED WITH PLAN OF CARE: Patient  PLAN FOR NEXT SESSION: iontophoresis, HEP progression, manual therapy  Inocente Blazing, MS, OTR/L  Inocente MARLA Blazing, OT 08/18/2023, 1:23 PM

## 2023-08-19 ENCOUNTER — Ambulatory Visit

## 2023-08-19 DIAGNOSIS — M79641 Pain in right hand: Secondary | ICD-10-CM

## 2023-08-19 DIAGNOSIS — M6281 Muscle weakness (generalized): Secondary | ICD-10-CM | POA: Diagnosis not present

## 2023-08-19 DIAGNOSIS — R278 Other lack of coordination: Secondary | ICD-10-CM

## 2023-08-19 DIAGNOSIS — M19041 Primary osteoarthritis, right hand: Secondary | ICD-10-CM

## 2023-08-19 NOTE — Therapy (Signed)
 OUTPATIENT OCCUPATIONAL THERAPY ORTHO TREATMENT NOTE  Patient Name: Shelby Henson MRN: [id number] DOB:1970/07/01, 54 y.o., female Today's Date: 08/19/2023  PCP: Dr. Dorette Loron REFERRING PROVIDER: Dr. Elspeth Parker (ortho)  END OF SESSION:  OT End of Session - 08/19/23 1411     Visit Number 5    Number of Visits 12    Date for OT Re-Evaluation 09/13/23    OT Start Time 1400    OT Stop Time 1445    OT Time Calculation (min) 45 min    Activity Tolerance Patient tolerated treatment well    Behavior During Therapy WFL for tasks assessed/performed          Past Medical History:  Diagnosis Date   Anemia    Asthma    well controlled   Colon polyps    Complication of anesthesia    DDD (degenerative disc disease), lumbar    Depression    GERD (gastroesophageal reflux disease)    occ   Iron  deficiency anemia    Migraine    Morbid obesity (HCC)    Osteoarthritis    Osteoarthritis of right hip    PONV (postoperative nausea and vomiting)    with c-section x 1   Right rotator cuff tear 11/2022   Vitamin D  deficiency    Past Surgical History:  Procedure Laterality Date   BREAST CYST ASPIRATION Left    CESAREAN SECTION  1990   CESAREAN SECTION  1993   CESAREAN SECTION  1996   COLONOSCOPY N/A 09/22/2020   Procedure: COLONOSCOPY;  Surgeon: Janalyn Keene NOVAK, MD;  Location: [facility];  Service: Endoscopy;  Laterality: N/A;   LAPAROSCOPY  2018   LEEP  2005   benign pathology; Dr. Rolm   SHOULDER ACROMIOPLASTY Right 01/03/2023   Procedure: RIGHT SHOULDER ACROMIOPLASTY;  Surgeon: Parker Elspeth, MD;  Location: [facility];  Service: Orthopedics;  Laterality: Right;   SHOULDER ARTHROSCOPY WITH ROTATOR CUFF REPAIR AND OPEN BICEPS TENODESIS Right 01/03/2023   Procedure: RIGHT SHOULDER ARTHROSCOPY WITH ROTATOR CUFF REPAIR AND  BICEPS TENODESIS;  Surgeon: Parker Elspeth, MD;  Location: [facility];  Service: Orthopedics;  Laterality: Right;   SUPRACERVICAL ABDOMINAL  HYSTERECTOMY  2010   leio/AUB; Dr. Rolm   TOTAL HIP ARTHROPLASTY Right 11/12/2018   Procedure: TOTAL HIP ARTHROPLASTY ANTERIOR APPROACH;  Surgeon: Leora Lynwood SAUNDERS, MD;  Location: [facility];  Service: Orthopedics;  Laterality: Right;   Patient Active Problem List   Diagnosis Date Noted   Severe persistent asthma without complication 03/29/2023   Nontraumatic incomplete tear of right rotator cuff 01/03/2023   Right rotator cuff tendonitis 09/11/2022   Iron  deficiency anemia due to dietary causes 08/10/2022   Chronic right shoulder pain 05/11/2022   Morbid obesity (HCC) 01/05/2022   Rotator cuff impingement syndrome, right 09/29/2021   Biceps tendinitis, right 09/29/2021   Meralgia paresthetica of right side 03/29/2021   Psoas tendinitis, right hip 03/29/2021   History of total right hip arthroplasty 03/29/2021   Status post total replacement of right hip 02/19/2021   Meralgia paresthetica of right side 02/19/2021   Polyp of ascending colon 02/19/2021   Polyp of sigmoid colon 02/19/2021   Special screening for malignant neoplasms, colon    Polyp of ascending colon    Polyp of sigmoid colon    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 03/23/2020   Pain in right hip 01/12/2019   Vitamin D  deficiency 02/25/2018   Chronic pain 10/24/2015   Hot flashes 10/24/2015  Migraine without aura and without status migrainosus, not intractable 10/24/2015   Metabolic syndrome 10/24/2015   Dyslipidemia 10/24/2015   Allergic rhinitis, seasonal 05/17/2015   Chronic insomnia 08/08/2014   DDD (degenerative disc disease), lumbar 08/08/2014   Mood disorder (HCC) 08/08/2014   Gastroesophageal reflux disease without esophagitis 08/08/2014   H/O total hysterectomy 08/08/2014   Irritable bowel syndrome with diarrhea 08/08/2014   Osteoarthritis of right hip 08/08/2014   Adult BMI 30+ 08/08/2014   SBO (spina bifida occulta) 08/08/2014   ONSET DATE: June 2024  REFERRING DIAG:  M19.041 (ICD-10-CM) - Arthritis of right hand   THERAPY DIAG:  Muscle weakness (generalized)  Other lack of coordination  Arthritis of right hand  Pain in right hand  Rationale for Evaluation and Treatment: Rehabilitation  SUBJECTIVE:  SUBJECTIVE STATEMENT: Pt reports pain increased on Saturday to 6/10 pain, though decreased today, and reports no specific activity that she can think of that may have contributed to her increased pain.  Pt accompanied by: self  PERTINENT HISTORY: Hx of R frozen shoulder post rotator cuff repair, arthritis in the hand, and palmar inflammation/thickening of flexor tendons in the palm.  OT encouraged pt inquire with Dr. Genelle about whether this could be Dupyutren's contracture.  Pt presents today with c/o of pain in R hand with Dx of arthritis.  Pain is specifically palmar side at the thenar eminence.  Pt reports pain has been constant for at least the last year.    PRECAUTIONS: General joint protection/cumulative trauma prevention   RED FLAGS: None   WEIGHT BEARING RESTRICTIONS: No  PAIN: 08/19/23: 3/10 pain R thumb  Are you having pain? Yes: NPRS scale: 2/10 pain at rest, up to 6-7/10 with activity Pain location: R hand/thenar eminence Pain description: achy Aggravating factors: general use of the hand, holding eating utensils, pinching Relieving factors: heat, massage, soft neoprene thumb spica  FALLS: Has patient fallen in last 6 months? 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  NEXT MD VISIT: Return to Dr. Genelle on Fri 08/09/23 for cortisone shot in the R shoulder   OBJECTIVE:  Note: Objective measures were completed at Evaluation unless otherwise noted.  HAND DOMINANCE: Right  ADLs: Difficulty opening containers and drink bottles, lifting heavy pots/pans, picking up grandson (18 months),  computer/mouse use (work related tasks), writing/filling out forms,   FUNCTIONAL OUTCOME MEASURES: MAM-20 for musculoskeletal conditions: 67/80  UPPER EXTREMITY ROM:  Full ROM in R hand digits, but pain with thumb flexion.  Able to oppose all digits to thumb in R hand, but pain with 4th and 5th digit opposition to thumb.  UPPER EXTREMITY MMT:    MMT Right eval Left eval  Shoulder flexion    Shoulder abduction    Shoulder adduction    Shoulder extension    Shoulder internal rotation    Shoulder external rotation    Middle trapezius    Lower trapezius    Elbow flexion    Elbow extension    Wrist flexion 5 5  Wrist extension 5 5  Wrist ulnar deviation 5 5  Wrist radial deviation 5 5  Wrist pronation    Wrist supination    (Blank rows = not tested)  HAND FUNCTION: Grip strength: Right: 35 lbs; Left: 45 lbs, Lateral pinch: Right: 11 lbs, Left: 14 lbs, and 3 point pinch: Right: 8 lbs, Left: 10 lbs  COORDINATION: 9 Hole Peg test: Right: 18 sec; Left: 19 sec  SENSATION: No tingling/numbness in R hand.  EDEMA: No visible edema  COGNITION: Overall cognitive status: Within functional limits for tasks assessed  OBSERVATIONS:  -Pt pleasant, cooperative, eager to work towards goals in OT poc.  -Thickened palmar/flexor tendons; encouraged pt to follow up with MD on Friday to inquire about whether this may be Dupuytren's contracture.  TREATMENT DATE: 08/19/23 Moist heat applied to R hand, used intermittently throughout session for pain management/muscle relaxation simultaneous to activities noted below.  Iontophoresis with dexamethasone , 1.5 cc (small patch): tx time: 22 min.  (5 of 10 planned treatments completed); applied small bandaid overtop small blister on thumb to protect from ionto application R volar thumb/thenar eminence, 40 mA/min, current amplitude 2.0 mA -Skin check performed; blister beginning to scab over.  Will continue to monitor.   Manual Therapy: -Soft tissue  massage with massage cream + Biofreeze for pain management/muscle relaxation to palmar hand, thenar eminence, and thumb abductors  Self Care: -Condition management education: Joint protection/cumulative trauma prevention strategies reviewed to reduce thumb strain:  -Recommendation to obtain mug with long handle to sip with hand cupped around mug   -Dycem issued for opening drink bottles/containers  -Trial of PenAgain, alternative prehension patterns to reduce thumb strain with handwriting, and built up foam around pen; pt preferred built up foam  -Issued built up foam grips for work and home writing utensils, fork, and knife   -Reviewed grooming aids and strategies to reduce thumb strain when pinching nail clippers   PATIENT EDUCATION: Education details: Environmental consultant Person educated: Patient Education method: Explanation and Verbal cues Education comprehension: verbalized understanding  HOME EXERCISE PROGRAM: -Issued small stress cushion for light pinching/gripping.  Will plan to issue yellow or pink putty in follow up visit.   -Pink theraputty for low reps of gripping/pinching to tolerance  GOALS: Goals reviewed with patient? Yes  SHORT TERM GOALS: Target date: 08/26/23  Pt will be indep to perform HEP for improving R hand strength for ADL/IADL/work related tasks and to maintain tissue extensibility in thickened flexor palmar tendons. Baseline: Eval: HEP initiated at eval; additional exercises will be added in upcoming sessions Goal status: INITIAL  2.  Pt will be indep to verbalize and implement 2-3 joint protection/cumulative trauma prevention strategies to reduce pain in the R hand.  Baseline: Eval: Education not yet initiated Goal status: INITIAL  LONG TERM GOALS: Target date: 09/13/23  Pt will increase MAM-20 score for musculoskeletal conditions by 9 or more points to indicate improvement in self perceived functional use of the R hand  for daily tasks.   Baseline: Eval: 67/80 Goal status: INITIAL  2.  Pt will increase R grip strength by 10 or more lbs to improve ability to hold and carry heavy ADL supplies. Baseline: Eval: R 35 lbs (L 45 lbs) Goal status: INITIAL  3.  Pt will increase R lateral pinch strength by 5 or more lbs to ease ability to open drink bottles. Baseline: Eval: R 11 lbs (L 14 lbs) Goal status: INITIAL  4.  Pt will tolerate manual therapy, therapeutic modalities, and exercises to decrease pain in R hand to a reported 0/10 pain at rest and 3/10 pain or      less with activity.  Baseline: Eval: 4/10 pain at rest, 6-7/10 pain with activity Goal status: INITIAL  ASSESSMENT:  CLINICAL IMPRESSION: 5th iontophoresis tx completed today with good tolerance.  Small blister still present beneath site  of electrode but scabbed over today.  OT covered this site for ionto tx to avoid worsening of blister.  Will continue to monitor.  Good tolerance to manual therapy to volar palm and thumb abductors.  Pt reports that she continues to stretch thumb abductors as this provides good pain relief for her.  Pt receptive to all joint protection education and recommendations for activity modifications.  Pt agreeable to trial built up foam grips over pen and eating utensils to reduce thumb strain, as pt verbalizes these 2 activities to be bothersome to her hand.  Pt will continue to benefit from skilled OT to address above noted goals, with pt's primary goals being to increase strength and decrease pain in her R dominant hand in order to improve indep and tolerance to daily tasks.  Pt in agreement with OT poc.  PERFORMANCE DEFICITS: in functional skills including ADLs, IADLs, strength, pain, fascial restrictions, body mechanics, decreased knowledge of precautions, decreased knowledge of use of DME, and UE functional use, and psychosocial skills including coping strategies, environmental adaptation, habits, and routines and behaviors.    IMPAIRMENTS: are limiting patient from ADLs, IADLs, rest and sleep, work, leisure, and social participation.   COMORBIDITIES: has co-morbidities such as OA, R rotator cuff tear/repair, asthma that affects occupational performance. Patient will benefit from skilled OT to address above impairments and improve overall function.  MODIFICATION OR ASSISTANCE TO COMPLETE EVALUATION: No modification of tasks or assist necessary to complete an evaluation.  OT OCCUPATIONAL PROFILE AND HISTORY: Detailed assessment: Review of records and additional review of physical, cognitive, psychosocial history related to current functional performance.  CLINICAL DECISION MAKING: Moderate - several treatment options, min-mod task modification necessary  REHAB POTENTIAL: Good  EVALUATION COMPLEXITY: Moderate      PLAN:  OT FREQUENCY: 2x/week  OT DURATION: 6 weeks  PLANNED INTERVENTIONS: 97168 OT Re-evaluation, 97535 self care/ADL training, 02889 therapeutic exercise, 97530 therapeutic activity, 97140 manual therapy, 97035 ultrasound, 97018 paraffin, 02989 moist heat, 97010 cryotherapy, 97034 contrast bath, 97033 iontophoresis, 97760 Orthotic Initial, 97763 Orthotic/Prosthetic subsequent, passive range of motion, psychosocial skills training, energy conservation, coping strategies training, patient/family education, and DME and/or AE instructions  RECOMMENDED OTHER SERVICES: None at this time  CONSULTED AND AGREED WITH PLAN OF CARE: Patient  PLAN FOR NEXT SESSION: iontophoresis, HEP progression, manual therapy  Inocente Blazing, MS, OTR/L  Inocente MARLA Blazing, OT 08/19/2023, 2:51 PM

## 2023-08-21 ENCOUNTER — Ambulatory Visit

## 2023-08-21 DIAGNOSIS — M6281 Muscle weakness (generalized): Secondary | ICD-10-CM | POA: Diagnosis not present

## 2023-08-21 DIAGNOSIS — M79641 Pain in right hand: Secondary | ICD-10-CM

## 2023-08-21 DIAGNOSIS — M19041 Primary osteoarthritis, right hand: Secondary | ICD-10-CM

## 2023-08-21 NOTE — Therapy (Signed)
 OUTPATIENT OCCUPATIONAL THERAPY ORTHO TREATMENT NOTE  Patient Name: Shelby Henson MRN: [id number] DOB:[birth year], 53 y.o., female Today's Date: 08/21/2023  PCP: Dr. Dorette Loron REFERRING PROVIDER: Dr. Elspeth Parker (ortho)  END OF SESSION:  OT End of Session - 08/21/23 1610     Visit Number 6    Number of Visits 12    Date for OT Re-Evaluation 09/13/23    OT Start Time 1400    OT Stop Time 1445    OT Time Calculation (min) 45 min    Activity Tolerance Patient tolerated treatment well    Behavior During Therapy WFL for tasks assessed/performed          Past Medical History:  Diagnosis Date   Anemia    Asthma    well controlled   Colon polyps    Complication of anesthesia    DDD (degenerative disc disease), lumbar    Depression    GERD (gastroesophageal reflux disease)    occ   Iron  deficiency anemia    Migraine    Morbid obesity (HCC)    Osteoarthritis    Osteoarthritis of right hip    PONV (postoperative nausea and vomiting)    with c-section x 1   Right rotator cuff tear 11/2022   Vitamin D  deficiency    Past Surgical History:  Procedure Laterality Date   BREAST CYST ASPIRATION Left    CESAREAN SECTION  1990   CESAREAN SECTION  1993   CESAREAN SECTION  1996   COLONOSCOPY N/A 09/22/2020   Procedure: COLONOSCOPY;  Surgeon: Janalyn Keene NOVAK, MD;  Location: [facility];  Service: Endoscopy;  Laterality: N/A;   LAPAROSCOPY  2018   LEEP  2005   benign pathology; Dr. Rolm   SHOULDER ACROMIOPLASTY Right 01/03/2023   Procedure: RIGHT SHOULDER ACROMIOPLASTY;  Surgeon: Parker Elspeth, MD;  Location: [facility];  Service: Orthopedics;  Laterality: Right;   SHOULDER ARTHROSCOPY WITH ROTATOR CUFF REPAIR AND OPEN BICEPS TENODESIS Right 01/03/2023   Procedure: RIGHT SHOULDER ARTHROSCOPY WITH ROTATOR CUFF REPAIR AND  BICEPS TENODESIS;  Surgeon: Parker Elspeth, MD;  Location: [facility];  Service: Orthopedics;  Laterality: Right;   SUPRACERVICAL ABDOMINAL  HYSTERECTOMY  2010   leio/AUB; Dr. Rolm   TOTAL HIP ARTHROPLASTY Right 11/12/2018   Procedure: TOTAL HIP ARTHROPLASTY ANTERIOR APPROACH;  Surgeon: Leora Lynwood SAUNDERS, MD;  Location: [facility];  Service: Orthopedics;  Laterality: Right;   Patient Active Problem List   Diagnosis Date Noted   Severe persistent asthma without complication 03/29/2023   Nontraumatic incomplete tear of right rotator cuff 01/03/2023   Right rotator cuff tendonitis 09/11/2022   Iron  deficiency anemia due to dietary causes 08/10/2022   Chronic right shoulder pain 05/11/2022   Morbid obesity (HCC) 01/05/2022   Rotator cuff impingement syndrome, right 09/29/2021   Biceps tendinitis, right 09/29/2021   Meralgia paresthetica of right side 03/29/2021   Psoas tendinitis, right hip 03/29/2021   History of total right hip arthroplasty 03/29/2021   Status post total replacement of right hip 02/19/2021   Meralgia paresthetica of right side 02/19/2021   Polyp of ascending colon 02/19/2021   Polyp of sigmoid colon 02/19/2021   Special screening for malignant neoplasms, colon    Polyp of ascending colon    Polyp of sigmoid colon    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 03/23/2020   Pain in right hip 01/12/2019   Vitamin D  deficiency 02/25/2018   Chronic pain 10/24/2015   Hot flashes 10/24/2015  Migraine without aura and without status migrainosus, not intractable 10/24/2015   Metabolic syndrome 10/24/2015   Dyslipidemia 10/24/2015   Allergic rhinitis, seasonal 05/17/2015   Chronic insomnia 08/08/2014   DDD (degenerative disc disease), lumbar 08/08/2014   Mood disorder (HCC) 08/08/2014   Gastroesophageal reflux disease without esophagitis 08/08/2014   H/O total hysterectomy 08/08/2014   Irritable bowel syndrome with diarrhea 08/08/2014   Osteoarthritis of right hip 08/08/2014   Adult BMI 30+ 08/08/2014   SBO (spina bifida occulta) 08/08/2014   ONSET DATE: June 2024  REFERRING DIAG:  M19.041 (ICD-10-CM) - Arthritis of right hand   THERAPY DIAG:  Muscle weakness (generalized)  Arthritis of right hand  Pain in right hand  Rationale for Evaluation and Treatment: Rehabilitation  SUBJECTIVE:  SUBJECTIVE STATEMENT: Pt reports pain in R thumb MP and CMC when releasing hand from a pinch. Pt accompanied by: self  PERTINENT HISTORY: Hx of R frozen shoulder post rotator cuff repair, arthritis in the hand, and palmar inflammation/thickening of flexor tendons in the palm.  OT encouraged pt inquire with Dr. Genelle about whether this could be Dupyutren's contracture.  Pt presents today with c/o of pain in R hand with Dx of arthritis.  Pain is specifically palmar side at the thenar eminence.  Pt reports pain has been constant for at least the last year.    PRECAUTIONS: General joint protection/cumulative trauma prevention   RED FLAGS: None   WEIGHT BEARING RESTRICTIONS: No  PAIN: 08/21/23: 2/10 pain R thumb  Are you having pain? Yes: NPRS scale: 2/10 pain at rest, up to 6-7/10 with activity Pain location: R hand/thenar eminence Pain description: achy Aggravating factors: general use of the hand, holding eating utensils, pinching Relieving factors: heat, massage, soft neoprene thumb spica  FALLS: Has patient fallen in last 6 months? 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  NEXT MD VISIT: Return to Dr. Genelle on Fri 08/09/23 for cortisone shot in the R shoulder   OBJECTIVE:  Note: Objective measures were completed at Evaluation unless otherwise noted.  HAND DOMINANCE: Right  ADLs: Difficulty opening containers and drink bottles, lifting heavy pots/pans, picking up grandson (18 months), computer/mouse use (work related tasks), writing/filling out forms,   FUNCTIONAL OUTCOME MEASURES: MAM-20 for musculoskeletal conditions:  67/80  UPPER EXTREMITY ROM:  Full ROM in R hand digits, but pain with thumb flexion.  Able to oppose all digits to thumb in R hand, but pain with 4th and 5th digit opposition to thumb.  UPPER EXTREMITY MMT:    MMT Right eval Left eval  Shoulder flexion    Shoulder abduction    Shoulder adduction    Shoulder extension    Shoulder internal rotation    Shoulder external rotation    Middle trapezius    Lower trapezius    Elbow flexion    Elbow extension    Wrist flexion 5 5  Wrist extension 5 5  Wrist ulnar deviation 5 5  Wrist radial deviation 5 5  Wrist pronation    Wrist supination    (Blank rows = not tested)  HAND FUNCTION: Grip strength: Right: 35 lbs; Left: 45 lbs, Lateral pinch: Right: 11 lbs, Left: 14 lbs, and 3 point pinch: Right: 8 lbs, Left: 10 lbs  COORDINATION: 9 Hole Peg test: Right: 18 sec; Left: 19 sec  SENSATION: No tingling/numbness in R hand.  EDEMA:  No visible edema  COGNITION: Overall cognitive status: Within functional limits for tasks assessed  OBSERVATIONS:  -Pt pleasant, cooperative, eager to work towards goals in OT poc.  -Thickened palmar/flexor tendons; encouraged pt to follow up with MD on Friday to inquire about whether this may be Dupuytren's contracture.  TREATMENT DATE: 08/21/23 Moist heat applied to R hand, used intermittently throughout session for pain management/muscle relaxation simultaneous to activities noted below.  Iontophoresis with dexamethasone , 1.5 cc (small patch): tx time: 22 min.  (6 of 10 planned treatments completed); applied small bandaid overtop small blister on thumb to protect from ionto application R volar thumb/thenar eminence/CMC, 40 mA/min, current amplitude 2.0 mA -Skin check performed; blister beginning to scab over.  Will continue to monitor.   Manual Therapy: -Soft tissue massage with massage cream for pain management/muscle relaxation to palmar hand, thenar eminence, and thumb abductors  Therapeutic  Exercise: -Issued yellow theraputty and instructed pt in strengthening exercises for R hand, including gross grasping, lateral/2 point/3 point pinching, digit ext, thumb abd/add, and digging coins out of putty.  Able to return demo with min vc for technique.  OT instructed pt to perform with low reps/conservatively, while monitoring to ensure no increases in pain.  Encouraged using smaller piece from putty to minimize resistance, and to perform exercises with less force if a specific exercise causes pain.  Avoid any exercises which can not be performed without pain.   PATIENT EDUCATION: Education details: HEP Person educated: Patient Education method: Leisure centre manager cues Education comprehension: verbalized understanding  HOME EXERCISE PROGRAM: -Issued small stress cushion for light pinching/gripping.  Will plan to issue yellow or pink putty in follow up visit.   -Pink theraputty for low reps of gripping/pinching to tolerance -08/21/23: downgraded to yellow theraputty  GOALS: Goals reviewed with patient? Yes  SHORT TERM GOALS: Target date: 08/26/23  Pt will be indep to perform HEP for improving R hand strength for ADL/IADL/work related tasks and to maintain tissue extensibility in thickened flexor palmar tendons. Baseline: Eval: HEP initiated at eval; additional exercises will be added in upcoming sessions Goal status: INITIAL  2.  Pt will be indep to verbalize and implement 2-3 joint protection/cumulative trauma prevention strategies to reduce pain in the R hand.  Baseline: Eval: Education not yet initiated Goal status: INITIAL  LONG TERM GOALS: Target date: 09/13/23  Pt will increase MAM-20 score for musculoskeletal conditions by 9 or more points to indicate improvement in self perceived functional use of the R hand for daily tasks.   Baseline: Eval: 67/80 Goal status: INITIAL  2.  Pt will increase R grip strength by 10 or more lbs to improve ability to hold and carry heavy ADL  supplies. Baseline: Eval: R 35 lbs (L 45 lbs) Goal status: INITIAL  3.  Pt will increase R lateral pinch strength by 5 or more lbs to ease ability to open drink bottles. Baseline: Eval: R 11 lbs (L 14 lbs) Goal status: INITIAL  4.  Pt will tolerate manual therapy, therapeutic modalities, and exercises to decrease pain in R hand to a reported 0/10 pain at rest and 3/10 pain or      less with activity.  Baseline: Eval: 4/10 pain at rest, 6-7/10 pain with activity Goal status: INITIAL  ASSESSMENT:  CLINICAL IMPRESSION: 6th iontophoresis tx completed today with good tolerance.  Small blister still present beneath site of electrode but healing.  Small circular Bandaid applied to cover only this irritated area.  Pt continues to respond  well to heat, ionto, and manual therapy for pain management in R thumb.  Pt reports she has been using her built up foam over her pen for writing at work, but has not yet applied foam to eating utensils.  OT encouraged pt trial foam with eating utensils as she does state that she experiences pain with gripping her fork.  Pt agreed.  Able to issue yellow theraputty today with pt reporting better tolerance than pink.  Pt receptive to strategies for downgrading exercises as needed.  Bilat thumbs show muscle atrophy and do feel slightly unstable/weakened.  OT provided education on benefits of strengthening exercises to improve joint stability and further reducing pain, but also importance of monitoring tolerance to exercises as to avoid flare ups of arthritic pain.  Pt verbalized understanding with goal to complete exercises with low reps and within pain tolerance.  Pt will continue to benefit from skilled OT to address above noted goals, with pt's primary goals being to increase strength and decrease pain in her R dominant hand in order to improve indep and tolerance to daily tasks.  Pt in agreement with OT poc.  PERFORMANCE DEFICITS: in functional skills including ADLs,  IADLs, strength, pain, fascial restrictions, body mechanics, decreased knowledge of precautions, decreased knowledge of use of DME, and UE functional use, and psychosocial skills including coping strategies, environmental adaptation, habits, and routines and behaviors.   IMPAIRMENTS: are limiting patient from ADLs, IADLs, rest and sleep, work, leisure, and social participation.   COMORBIDITIES: has co-morbidities such as OA, R rotator cuff tear/repair, asthma that affects occupational performance. Patient will benefit from skilled OT to address above impairments and improve overall function.  MODIFICATION OR ASSISTANCE TO COMPLETE EVALUATION: No modification of tasks or assist necessary to complete an evaluation.  OT OCCUPATIONAL PROFILE AND HISTORY: Detailed assessment: Review of records and additional review of physical, cognitive, psychosocial history related to current functional performance.  CLINICAL DECISION MAKING: Moderate - several treatment options, min-mod task modification necessary  REHAB POTENTIAL: Good  EVALUATION COMPLEXITY: Moderate      PLAN:  OT FREQUENCY: 2x/week  OT DURATION: 6 weeks  PLANNED INTERVENTIONS: 97168 OT Re-evaluation, 97535 self care/ADL training, 02889 therapeutic exercise, 97530 therapeutic activity, 97140 manual therapy, 97035 ultrasound, 97018 paraffin, 02989 moist heat, 97010 cryotherapy, 97034 contrast bath, 97033 iontophoresis, 97760 Orthotic Initial, 97763 Orthotic/Prosthetic subsequent, passive range of motion, psychosocial skills training, energy conservation, coping strategies training, patient/family education, and DME and/or AE instructions  RECOMMENDED OTHER SERVICES: None at this time  CONSULTED AND AGREED WITH PLAN OF CARE: Patient  PLAN FOR NEXT SESSION: iontophoresis, HEP progression, manual therapy  Inocente Blazing, MS, OTR/L  Inocente MARLA Blazing, OT 08/21/2023, 4:11 PM

## 2023-08-21 NOTE — Therapy (Incomplete)
 OUTPATIENT PHYSICAL THERAPY SHOULDER TREATMENT/ ***       Patient Name: Shelby Henson MRN: [id number] DOB:01-23-1971, 53 y.o., female Today's Date: 08/21/2023  END OF SESSION:         Past Medical History:  Diagnosis Date   Anemia    Asthma    well controlled   Colon polyps    Complication of anesthesia    DDD (degenerative disc disease), lumbar    Depression    GERD (gastroesophageal reflux disease)    occ   Iron  deficiency anemia    Migraine    Morbid obesity (HCC)    Osteoarthritis    Osteoarthritis of right hip    PONV (postoperative nausea and vomiting)    with c-section x 1   Right rotator cuff tear 11/2022   Vitamin D  deficiency    Past Surgical History:  Procedure Laterality Date   BREAST CYST ASPIRATION Left    CESAREAN SECTION  1990   CESAREAN SECTION  1993   CESAREAN SECTION  1996   COLONOSCOPY N/A 09/22/2020   Procedure: COLONOSCOPY;  Surgeon: Janalyn Keene NOVAK, MD;  Location: [facility];  Service: Endoscopy;  Laterality: N/A;   LAPAROSCOPY  2018   LEEP  2005   benign pathology; Dr. Rolm   SHOULDER ACROMIOPLASTY Right 01/03/2023   Procedure: RIGHT SHOULDER ACROMIOPLASTY;  Surgeon: Genelle Standing, MD;  Location: [facility];  Service: Orthopedics;  Laterality: Right;   SHOULDER ARTHROSCOPY WITH ROTATOR CUFF REPAIR AND OPEN BICEPS TENODESIS Right 01/03/2023   Procedure: RIGHT SHOULDER ARTHROSCOPY WITH ROTATOR CUFF REPAIR AND  BICEPS TENODESIS;  Surgeon: Genelle Standing, MD;  Location: [facility];  Service: Orthopedics;  Laterality: Right;   SUPRACERVICAL ABDOMINAL HYSTERECTOMY  2010   leio/AUB; Dr. Rolm   TOTAL HIP ARTHROPLASTY Right 11/12/2018   Procedure: TOTAL HIP ARTHROPLASTY ANTERIOR APPROACH;  Surgeon: Leora Lynwood SAUNDERS, MD;  Location: [facility];  Service: Orthopedics;  Laterality: Right;   Patient Active Problem List   Diagnosis Date Noted   Severe persistent asthma without complication 03/29/2023   Nontraumatic incomplete tear of  right rotator cuff 01/03/2023   Right rotator cuff tendonitis 09/11/2022   Iron  deficiency anemia due to dietary causes 08/10/2022   Chronic right shoulder pain 05/11/2022   Morbid obesity (HCC) 01/05/2022   Rotator cuff impingement syndrome, right 09/29/2021   Biceps tendinitis, right 09/29/2021   Meralgia paresthetica of right side 03/29/2021   Psoas tendinitis, right hip 03/29/2021   History of total right hip arthroplasty 03/29/2021   Status post total replacement of right hip 02/19/2021   Meralgia paresthetica of right side 02/19/2021   Polyp of ascending colon 02/19/2021   Polyp of sigmoid colon 02/19/2021   Special screening for malignant neoplasms, colon    Polyp of ascending colon    Polyp of sigmoid colon    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 03/23/2020   Pain in right hip 01/12/2019   Vitamin D  deficiency 02/25/2018   Chronic pain 10/24/2015   Hot flashes 10/24/2015   Migraine without aura and without status migrainosus, not intractable 10/24/2015   Metabolic syndrome 10/24/2015   Dyslipidemia 10/24/2015   Allergic rhinitis, seasonal 05/17/2015   Chronic insomnia 08/08/2014   DDD (degenerative disc disease), lumbar 08/08/2014   Mood disorder (HCC) 08/08/2014   Gastroesophageal reflux disease without esophagitis 08/08/2014   H/O total hysterectomy 08/08/2014   Irritable bowel syndrome with diarrhea 08/08/2014   Osteoarthritis of right hip 08/08/2014   Adult BMI 30+  08/08/2014   SBO (spina bifida occulta) 08/08/2014    PCP: Dorette Loron, MD   REFERRING PROVIDER: Elspeth Parker, MD orthopedics   REFERRING DIAG: Rt arthroscopic rotator cuff repair   THERAPY DIAG:  No diagnosis found.  Rationale for Evaluation and Treatment: Rehabilitation  ONSET DATE: 01/03/23  SUBJECTIVE:                                                                                                                                                                                       SUBJECTIVE STATEMENT: ***  PERTINENT HISTORY: Shelby Henson is a 53yo F presents to outpatient physical therapy on postop day 4 status post right arthroscopic shoulder debridement of supraspinatus tendon, anterior glenoid labrum, subacromial depression, rotator cuff repair with Dr. Elspeth Parker.  Patient reports more than 1 year of insidious onset shoulder pain previously followed by sports medicine Dr. Alvia.  PAIN:  Are you having pain? 3/10 right shoulder: Worst pain was 8 out of 10 while sleeping overnight 2 nights ago.  PRECAUTIONS: Patient in sling immobilizer and nonweightbearing x 2 weeks.  Awaiting updates pending follow-up visit with orthopedics.   WEIGHT BEARING RESTRICTIONS: Yes, right upper extremity nonweightbearing  FALLS:  Has patient fallen in last 6 months?  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 PATIENT SURVEYS:  FOTO: 4  SENSATION: WNL  POSTURE: WNL  UPPER EXTREMITY ROM:    ROM Right eval Left eval  Shoulder flexion 90   Shoulder extension NT   Shoulder abduction NT   Shoulder internal rotation full   Shoulder external rotation NT   Elbow flexion full   Elbow extension full    Wrist flexion full    Wrist extension full    Wrist ulnar deviation full    Wrist radial deviation full    forearm pronation full    forearm supination full    (Blank rows = not tested)    PALPATION:  Tenderness at the posterior shoulder portal wound while adjusting/donning sling   *PROTOCOL LOCATED IN MEDIA SECTION OF EPIC   TODAY'S TREATMENT: DATE: 08/21/23     Manual therapy: (supine)  Gentle grade 1-2 GH joint mobs (inf/PA/AP) x  several min Distraction of GH 30 seconds x 2 trials  Bicep ischemic release with movement x multiple minutes   TherEx: Gentle PROM right shoulder flex, abduction, ER, IR 10x  with prolonged holds  Wall  Shoulder  extension facing wall 10x;  2 sets Abduction snow angels 10x; 2 sets Wall pushup 10x; 2 sets  Supine: 3lb DB PNF 2; 10x; 2 sets 3lb DB PNF 1 10; x2 sets 3lb scapular punch 10x; 2 sets    PATIENT EDUCATION: Education details: See above- extensive education this visit  Person educated: patient  Education method: collaborative learning, deliberate practice, positive reinforcement, explicit instruction, establish rules,  Education comprehension: adequate   HOME EXERCISE PROGRAM: Access Code: [email or web address] URL: [email or web address] Date: 04/17/2023 Prepared by: Jamye Balicki  Exercises - Seated Shoulder Flexion Full Range  - 1 x daily - 7 x weekly - 2 sets - 10 reps - 5 hold - Seated Shoulder Abduction  - 1 x daily - 7 x weekly - 2 sets - 10 reps - 5 hold - Seated Shoulder External Rotation  - 1 x daily - 7 x weekly - 2 sets - 10 reps - 5 hold - Seated Scapular Retraction  - 1 x daily - 7 x weekly - 2 sets - 10 reps - 5 hold - Supine Shoulder Flexion with Dowel  - 1 x daily - 7 x weekly - 2 sets - 10 reps - 5 hold - Supine Shoulder External Rotation in Abduction  - 1 x daily - 7 x weekly - 2 sets - 10 reps - 5 hold - Supine Shoulder Abduction AROM  - 1 x daily - 7 x weekly - 2 sets - 10 reps - 5 hold Access Code: [email or web address] URL: [email or web address] Date: 02/04/2023 Prepared by: Dilara Navarrete  Exercises - Supine Shoulder Flexion AAROM with Dowel  - 1 x daily - 7 x weekly - 2 sets - 10 reps - 5 hold - Supine Shoulder Abduction AAROM with Dowel  - 1 x daily - 7 x weekly - 2 sets - 10 reps - 5 hold - Supine Shoulder External Rotation with Dowel  - 1 x daily - 7 x weekly - 2 sets - 10 reps - 5 hold - Seated Scapular Retraction  - 1 x daily - 7 x  weekly - 2 sets - 10 reps - 5 hold  ASSESSMENT:  CLINICAL IMPRESSION: *** Pt will continue to benefit from skilled therapy to address remaining deficits in order to improve overall QoL and return to PLOF.      OBJECTIVE IMPAIRMENTS: decreased activity tolerance, decreased endurance, decreased knowledge of condition, decreased ROM, decreased strength, increased muscle spasms, impaired flexibility, and improper body mechanics.   ACTIVITY LIMITATIONS: carrying, lifting, bathing, toileting, dressing, self feeding, reach over head, and hygiene/grooming  PARTICIPATION LIMITATIONS: meal prep, cleaning, laundry, driving, shopping, community activity, occupation, and yard work  PERSONAL FACTORS: Age, Behavior pattern, Education, Fitness, Past/current experiences, Profession, Social background, Time since onset of injury/illness/exacerbation, and Transportation are also affecting patient's functional outcome.   REHAB POTENTIAL: Excellent  CLINICAL DECISION MAKING: Stable/uncomplicated  EVALUATION COMPLEXITY: Low   GOALS: Goals reviewed with patient? No  SHORT TERM GOALS: Target date: 04/30/2023  Pt to demonstrate Rt shoulder P/ROM flexion to >120 degrees, ABDCT >90 degrees, ER >45 degrees to improve ability to perform ADL/selfcare.  Baseline: 1/22: flexion: 70, abduction 38, scaption 71 3/4: MET Goal status:MET  2.  Pt to report successful HEP performance and successful adherence to postoperative restrictions.  Baseline: HEP compliant Goal status:MET  3.  Pt to demonstrate 3/5 Rt shoulder MMT to 90 degrees of flexion and 90 degrees of abduction.  Baseline: unable 1/22: not able to due to protocol 6/25; met Goal status:  iMET   LONG TERM GOALS: Target date: 08/21/2023 ***    Pt to improve FOTO survey goal by >60 points to indicate a monstrous reduction in difficulty of Rt limb use in a variety of questionable activities.  Baseline: 4 1/22: 44 Goal status: Deferred   2.  Pt to  demonstrate 5/5 Rt shoulder flexion, abduction, ER, IR, extension; 5/5 Rt elbow flexion/extension  Baseline: 1/22: deferred due to protocol 3/4: 3/5 strength  4/1: flexion 4/5, extension 4/5 abduction 3/5 5/14: Flexion 4/5, Extension: 4+/5, Abduction: 3*/5; pt still reporting significant pain in the shoulder flexion/abduction. 6/25: flexion 4+/5 abduction 4-/5 with pain, extension 5/5; IR 4+ ER 3+ Goal status: Ongoing   3.  Pt to demonstrate >155 degrees Rt shoulder A/ROM without end-range pain>2/10.  Baseline: 1/22: flexion: 70, abduction 38, scaption 71 3/4: flexion: 115 abduction: 92 scaption:  120  4/1: flexion: 131 abduction: 138; scaption: 150 5/14: Flexion: 150, Abduction: 170, Scaption: 150; 2/10 pain at the end range feeling stuck Goal status:MET  4.  Pt to demonstrate ability to perform gym-type exercises of resistance and aerobic natures without pain exacerbation in shoulder to allow for return to regular exercise at DC.  Baseline: 1/22: not released from protocol yet 3/4: no yet to this point of protocol  6/25: has not been to the gym due to secondary health issue  Goal status: Ongoing   5   Patient will decrease Quick DASH score by > 8 points demonstrating reduced self-reported upper extremity disability. Baseline: 4/1: 37%  5/14: 25% 6/25: 20%  Goal status: MET, Progressed  PLAN:  PT FREQUENCY: 1-2x/week  PT DURATION: 4 weeks   PLANNED INTERVENTIONS: 97110-Therapeutic exercises, 97530- Therapeutic activity, 97112- Neuromuscular re-education, 97535- Self Care, 02859- Manual therapy, 97014- Electrical stimulation (unattended), [phone]- Electrical stimulation (manual), Patient/Family education, and Moist heat  PLAN FOR NEXT SESSION: strengthening, pain reduction   Benton Tooker  Leopoldo, PT, DPT Physical Therapist - [facility]  Outpatient Physical Therapy- Main [facility] [phone]    08/21/23, 8:10 AM

## 2023-08-22 ENCOUNTER — Ambulatory Visit

## 2023-08-22 DIAGNOSIS — M6281 Muscle weakness (generalized): Secondary | ICD-10-CM | POA: Diagnosis not present

## 2023-08-22 DIAGNOSIS — M25511 Pain in right shoulder: Secondary | ICD-10-CM

## 2023-08-22 DIAGNOSIS — M25611 Stiffness of right shoulder, not elsewhere classified: Secondary | ICD-10-CM

## 2023-08-22 NOTE — Therapy (Signed)
 OUTPATIENT PHYSICAL THERAPY SHOULDER TREATMENT/ recert for discharge       Patient Name: Shelby Henson MRN: [id number] DOB:12/14/1970, 53 y.o., female Today's Date: 08/22/2023  END OF SESSION:  PT End of Session - 08/22/23 0933     Visit Number 28    Number of Visits 30    Date for PT Re-Evaluation 08/22/23    PT Start Time 0930    PT Stop Time 1014    PT Time Calculation (min) 44 min                Past Medical History:  Diagnosis Date   Anemia    Asthma    well controlled   Colon polyps    Complication of anesthesia    DDD (degenerative disc disease), lumbar    Depression    GERD (gastroesophageal reflux disease)    occ   Iron  deficiency anemia    Migraine    Morbid obesity (HCC)    Osteoarthritis    Osteoarthritis of right hip    PONV (postoperative nausea and vomiting)    with c-section x 1   Right rotator cuff tear 11/2022   Vitamin D  deficiency    Past Surgical History:  Procedure Laterality Date   BREAST CYST ASPIRATION Left    CESAREAN SECTION  1990   CESAREAN SECTION  1993   CESAREAN SECTION  1996   COLONOSCOPY N/A 09/22/2020   Procedure: COLONOSCOPY;  Surgeon: Janalyn Keene NOVAK, MD;  Location: [facility];  Service: Endoscopy;  Laterality: N/A;   LAPAROSCOPY  2018   LEEP  2005   benign pathology; Dr. Rolm   SHOULDER ACROMIOPLASTY Right 01/03/2023   Procedure: RIGHT SHOULDER ACROMIOPLASTY;  Surgeon: Genelle Standing, MD;  Location: [facility];  Service: Orthopedics;  Laterality: Right;   SHOULDER ARTHROSCOPY WITH ROTATOR CUFF REPAIR AND OPEN BICEPS TENODESIS Right 01/03/2023   Procedure: RIGHT SHOULDER ARTHROSCOPY WITH ROTATOR CUFF REPAIR AND  BICEPS TENODESIS;  Surgeon: Genelle Standing, MD;  Location: [facility];  Service: Orthopedics;  Laterality: Right;   SUPRACERVICAL ABDOMINAL HYSTERECTOMY  2010   leio/AUB; Dr. Rolm   TOTAL HIP ARTHROPLASTY Right 11/12/2018   Procedure: TOTAL HIP ARTHROPLASTY ANTERIOR APPROACH;  Surgeon: Leora Lynwood SAUNDERS, MD;  Location: [facility];  Service: Orthopedics;  Laterality: Right;   Patient Active Problem List   Diagnosis Date Noted   Severe persistent asthma without complication 03/29/2023   Nontraumatic incomplete tear of right rotator cuff 01/03/2023   Right rotator cuff tendonitis 09/11/2022   Iron  deficiency anemia due to dietary causes 08/10/2022   Chronic right shoulder pain 05/11/2022   Morbid obesity (HCC) 01/05/2022   Rotator cuff impingement syndrome, right 09/29/2021   Biceps tendinitis, right 09/29/2021   Meralgia paresthetica of right side 03/29/2021   Psoas tendinitis, right hip 03/29/2021   History of total right hip arthroplasty 03/29/2021   Status post total replacement of right hip 02/19/2021   Meralgia paresthetica of right side 02/19/2021   Polyp of ascending colon 02/19/2021   Polyp of sigmoid colon 02/19/2021   Special screening for malignant neoplasms, colon    Polyp of ascending colon    Polyp of sigmoid colon    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 03/23/2020   Pain in right hip 01/12/2019   Vitamin D  deficiency 02/25/2018   Chronic pain 10/24/2015   Hot flashes 10/24/2015   Migraine without aura and without status migrainosus, not intractable 10/24/2015   Metabolic syndrome 10/24/2015  Dyslipidemia 10/24/2015   Allergic rhinitis, seasonal 05/17/2015   Chronic insomnia 08/08/2014   DDD (degenerative disc disease), lumbar 08/08/2014   Mood disorder (HCC) 08/08/2014   Gastroesophageal reflux disease without esophagitis 08/08/2014   H/O total hysterectomy 08/08/2014   Irritable bowel syndrome with diarrhea 08/08/2014   Osteoarthritis of right hip 08/08/2014   Adult BMI 30+ 08/08/2014   SBO (spina bifida occulta) 08/08/2014    PCP: Dorette Loron, MD   REFERRING PROVIDER: Elspeth Parker, MD orthopedics   REFERRING DIAG: Rt arthroscopic rotator cuff repair   THERAPY DIAG:  Muscle weakness (generalized) - Plan: PT plan  of care cert/re-cert  Right shoulder pain, unspecified chronicity - Plan: PT plan of care cert/re-cert  Stiffness of right shoulder, not elsewhere classified - Plan: PT plan of care cert/re-cert  Rationale for Evaluation and Treatment: Rehabilitation  ONSET DATE: 01/03/23  SUBJECTIVE:                                                                                                                                                                                      SUBJECTIVE STATEMENT: Patient reports the injection did not help.   PERTINENT HISTORY: Shelby Henson is a 53yo F presents to outpatient physical therapy on postop day 4 status post right arthroscopic shoulder debridement of supraspinatus tendon, anterior glenoid labrum, subacromial depression, rotator cuff repair with Dr. Elspeth Parker.  Patient reports more than 1 year of insidious onset shoulder pain previously followed by sports medicine Dr. Alvia.  PAIN:  Are you having pain? 3/10 right shoulder: Worst pain was 8 out of 10 while sleeping overnight 2 nights ago.  PRECAUTIONS: Patient in sling immobilizer and nonweightbearing x 2 weeks.  Awaiting updates pending follow-up visit with orthopedics.   WEIGHT BEARING RESTRICTIONS: Yes, right upper extremity nonweightbearing  FALLS:  Has patient fallen in last 6 months?  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 PATIENT SURVEYS:  FOTO: 4  SENSATION: WNL  POSTURE: WNL  UPPER EXTREMITY ROM:    ROM Right eval Left eval  Shoulder flexion 90   Shoulder extension NT   Shoulder abduction NT   Shoulder internal rotation full   Shoulder external rotation NT   Elbow flexion  full   Elbow extension full    Wrist flexion full    Wrist extension full    Wrist ulnar deviation full    Wrist radial deviation full  forearm pronation full    forearm supination full    (Blank rows = not tested)    PALPATION:  Tenderness at the posterior shoulder portal wound while adjusting/donning sling   *PROTOCOL LOCATED IN MEDIA SECTION OF EPIC   TODAY'S TREATMENT: DATE: 08/22/23     Manual therapy: (supine)  Gentle grade 1-2 GH joint mobs (inf/PA/AP) x several min Distraction of GH 30 seconds x 2 trials  Bicep ischemic release with movement x multiple minutes   TherEx: Gentle PROM right shoulder flex, abduction, ER, IR 10x  with prolonged holds  Wall  Shoulder extension facing wall 10x;  2 sets Abduction snow angels 10x; 2 sets Shoulder IR/ER 15x Shoulder flexion back against wall 10x  Wall L stretch 30 seconds x 2 trials       PATIENT EDUCATION: Education details: See above- extensive education this visit  Person educated: patient  Education method: Research scientist (medical), deliberate practice, positive reinforcement, explicit instruction, establish rules,  Education comprehension: adequate   HOME EXERCISE PROGRAM: Access Code: [email or web address] URL: [email or web address] Date: 04/17/2023 Prepared by: Oralia Criger  Exercises - Seated Shoulder Flexion Full Range  - 1 x daily - 7 x weekly - 2 sets - 10 reps - 5 hold - Seated Shoulder Abduction  - 1 x daily - 7 x weekly - 2 sets - 10 reps - 5 hold - Seated Shoulder External Rotation  - 1 x daily - 7 x weekly - 2 sets - 10 reps - 5 hold - Seated Scapular Retraction  - 1 x daily - 7 x weekly - 2 sets - 10 reps - 5 hold - Supine Shoulder Flexion with Dowel  - 1 x daily - 7 x weekly - 2 sets - 10 reps - 5 hold - Supine Shoulder External Rotation in Abduction  - 1 x daily - 7 x weekly - 2 sets - 10 reps - 5 hold - Supine Shoulder Abduction AROM  - 1 x daily - 7 x weekly - 2 sets - 10 reps - 5  hold Access Code: [email or web address] URL: [email or web address] Date: 02/04/2023 Prepared by: Victorious Kundinger  Exercises - Supine Shoulder Flexion AAROM with Dowel  - 1 x daily - 7 x weekly - 2 sets - 10 reps - 5 hold - Supine Shoulder Abduction AAROM with Dowel  - 1 x daily - 7 x weekly - 2 sets - 10 reps - 5 hold - Supine Shoulder External Rotation with Dowel  - 1 x daily - 7 x weekly - 2 sets - 10 reps - 5 hold - Seated Scapular Retraction  - 1 x daily - 7 x weekly - 2 sets - 10 reps - 5 hold  ASSESSMENT:  CLINICAL IMPRESSION:  Patient to be recerted for one time discharge. Patient agreeable with plan. Will be happy to see patient again in the future as needed.     OBJECTIVE IMPAIRMENTS: decreased activity tolerance, decreased endurance, decreased knowledge of condition, decreased ROM, decreased strength, increased muscle spasms, impaired flexibility, and improper body mechanics.   ACTIVITY LIMITATIONS: carrying, lifting, bathing, toileting, dressing, self feeding, reach over head, and hygiene/grooming  PARTICIPATION LIMITATIONS: meal prep, cleaning, laundry, driving, shopping, community activity, occupation, and yard work  PERSONAL FACTORS: Age, Behavior pattern, Education, Fitness, Past/current experiences, Profession, Social background, Time since onset of injury/illness/exacerbation, and Transportation are also affecting patient's functional outcome.   REHAB POTENTIAL: Excellent  CLINICAL DECISION MAKING: Stable/uncomplicated  EVALUATION COMPLEXITY: Low   GOALS: Goals reviewed with patient? No  SHORT TERM GOALS: Target date: 04/30/2023  Pt to demonstrate Rt shoulder P/ROM flexion to >120 degrees, ABDCT >90 degrees, ER >45 degrees to improve ability to perform ADL/selfcare.  Baseline: 1/22: flexion: 70, abduction 38, scaption 71 3/4: MET Goal status:MET  2.  Pt to report successful HEP performance and successful adherence to postoperative restrictions.  Baseline: HEP  compliant Goal status:MET  3.  Pt to demonstrate 3/5 Rt shoulder MMT to 90 degrees of flexion and 90 degrees of abduction.  Baseline: unable 1/22: not able to due to protocol 6/25; met Goal status: iMET   LONG TERM GOALS: Target date: 08/22/23     Pt to improve FOTO survey goal by >60 points to indicate a monstrous reduction in difficulty of Rt limb use in a variety of questionable activities.  Baseline: 4 1/22: 44 Goal status: Deferred   2.  Pt to demonstrate 5/5 Rt shoulder flexion, abduction, ER, IR, extension; 5/5 Rt elbow flexion/extension  Baseline: 1/22: deferred due to protocol 3/4: 3/5 strength  4/1: flexion 4/5, extension 4/5 abduction 3/5 5/14: Flexion 4/5, Extension: 4+/5, Abduction: 3*/5; pt still reporting significant pain in the shoulder flexion/abduction. 6/25: flexion 4+/5 abduction 4-/5 with pain, extension 5/5; IR 4+ ER 3+ Goal status: Ongoing   3.  Pt to demonstrate >155 degrees Rt shoulder A/ROM without end-range pain>2/10.  Baseline: 1/22: flexion: 70, abduction 38, scaption 71 3/4: flexion: 115 abduction: 92 scaption:  120  4/1: flexion: 131 abduction: 138; scaption: 150 5/14: Flexion: 150, Abduction: 170, Scaption: 150; 2/10 pain at the end range feeling stuck Goal status:MET  4.  Pt to demonstrate ability to perform gym-type exercises of resistance and aerobic natures without pain exacerbation in shoulder to allow for return to regular exercise at DC.  Baseline: 1/22: not released from protocol yet 3/4: no yet to this point of protocol  6/25: has not been to the gym due to secondary health issue  7/24:  has not gone to the gym due to personal reasons not due to arm Goal status: Ongoing   5   Patient will decrease Quick DASH score by > 8 points demonstrating reduced self-reported upper extremity disability. Baseline: 4/1: 37%  5/14: 25% 6/25: 20%  7/24: 22% Goal status: MET, Progressed  PLAN:  PT FREQUENCY: 1-2x/week  PT DURATION: 4 weeks    PLANNED INTERVENTIONS: 97110-Therapeutic exercises, 97530- Therapeutic activity, 97112- Neuromuscular re-education, 97535- Self Care, 02859- Manual therapy, 97014- Electrical stimulation (unattended), [phone]- Electrical stimulation (manual), Patient/Family education, and Moist heat  PLAN FOR NEXT SESSION: strengthening, pain reduction   Asmaa Tirpak  Leopoldo, PT, DPT Physical Therapist - [facility]  Outpatient Physical Therapy- [facility] [phone]    08/22/23, 11:27 AM

## 2023-08-24 DIAGNOSIS — F4322 Adjustment disorder with anxiety: Secondary | ICD-10-CM | POA: Diagnosis not present

## 2023-08-29 ENCOUNTER — Encounter (HOSPITAL_BASED_OUTPATIENT_CLINIC_OR_DEPARTMENT_OTHER): Payer: Self-pay | Admitting: Orthopaedic Surgery

## 2023-08-30 ENCOUNTER — Other Ambulatory Visit (HOSPITAL_BASED_OUTPATIENT_CLINIC_OR_DEPARTMENT_OTHER): Payer: Self-pay | Admitting: Orthopaedic Surgery

## 2023-08-30 DIAGNOSIS — M75111 Incomplete rotator cuff tear or rupture of right shoulder, not specified as traumatic: Secondary | ICD-10-CM

## 2023-09-06 ENCOUNTER — Other Ambulatory Visit

## 2023-09-07 DIAGNOSIS — F4322 Adjustment disorder with anxiety: Secondary | ICD-10-CM | POA: Diagnosis not present

## 2023-09-09 ENCOUNTER — Other Ambulatory Visit: Payer: Self-pay

## 2023-09-09 ENCOUNTER — Other Ambulatory Visit: Payer: Self-pay | Admitting: Family Medicine

## 2023-09-09 DIAGNOSIS — G5711 Meralgia paresthetica, right lower limb: Secondary | ICD-10-CM

## 2023-09-11 ENCOUNTER — Ambulatory Visit: Attending: Family Medicine

## 2023-09-11 DIAGNOSIS — M79641 Pain in right hand: Secondary | ICD-10-CM | POA: Insufficient documentation

## 2023-09-11 DIAGNOSIS — M6281 Muscle weakness (generalized): Secondary | ICD-10-CM | POA: Diagnosis not present

## 2023-09-11 DIAGNOSIS — M19041 Primary osteoarthritis, right hand: Secondary | ICD-10-CM | POA: Diagnosis not present

## 2023-09-11 NOTE — Therapy (Signed)
 OUTPATIENT OCCUPATIONAL THERAPY ORTHO TREATMENT NOTE  Patient Name: Shelby Henson MRN: [id number] DOB:Jun 11, 1970, 53 y.o., female Today's Date: 09/14/2023  PCP: Dr. Dorette Loron REFERRING PROVIDER: Dr. Elspeth Parker (ortho)  END OF SESSION:  OT End of Session - 09/14/23 2046     Visit Number 7    Number of Visits 12    Date for OT Re-Evaluation 09/13/23    OT Start Time 1100    OT Stop Time 1145    OT Time Calculation (min) 45 min    Activity Tolerance Patient tolerated treatment well    Behavior During Therapy WFL for tasks assessed/performed          Past Medical History:  Diagnosis Date   Anemia    Asthma    well controlled   Colon polyps    Complication of anesthesia    DDD (degenerative disc disease), lumbar    Depression    GERD (gastroesophageal reflux disease)    occ   Iron  deficiency anemia    Migraine    Morbid obesity (HCC)    Osteoarthritis    Osteoarthritis of right hip    PONV (postoperative nausea and vomiting)    with c-section x 1   Right rotator cuff tear 11/2022   Vitamin D  deficiency    Past Surgical History:  Procedure Laterality Date   BREAST CYST ASPIRATION Left    CESAREAN SECTION  1990   CESAREAN SECTION  1993   CESAREAN SECTION  1996   COLONOSCOPY N/A 09/22/2020   Procedure: COLONOSCOPY;  Surgeon: Janalyn Keene NOVAK, MD;  Location: [facility];  Service: Endoscopy;  Laterality: N/A;   LAPAROSCOPY  2018   LEEP  2005   benign pathology; Dr. Rolm   SHOULDER ACROMIOPLASTY Right 01/03/2023   Procedure: RIGHT SHOULDER ACROMIOPLASTY;  Surgeon: Parker Elspeth, MD;  Location: [facility];  Service: Orthopedics;  Laterality: Right;   SHOULDER ARTHROSCOPY WITH ROTATOR CUFF REPAIR AND OPEN BICEPS TENODESIS Right 01/03/2023   Procedure: RIGHT SHOULDER ARTHROSCOPY WITH ROTATOR CUFF REPAIR AND  BICEPS TENODESIS;  Surgeon: Parker Elspeth, MD;  Location: [facility];  Service: Orthopedics;  Laterality: Right;   SUPRACERVICAL ABDOMINAL  HYSTERECTOMY  2010   leio/AUB; Dr. Rolm   TOTAL HIP ARTHROPLASTY Right 11/12/2018   Procedure: TOTAL HIP ARTHROPLASTY ANTERIOR APPROACH;  Surgeon: Leora Lynwood SAUNDERS, MD;  Location: [facility];  Service: Orthopedics;  Laterality: Right;   Patient Active Problem List   Diagnosis Date Noted   Severe persistent asthma without complication 03/29/2023   Nontraumatic incomplete tear of right rotator cuff 01/03/2023   Right rotator cuff tendonitis 09/11/2022   Iron  deficiency anemia due to dietary causes 08/10/2022   Chronic right shoulder pain 05/11/2022   Morbid obesity (HCC) 01/05/2022   Rotator cuff impingement syndrome, right 09/29/2021   Biceps tendinitis, right 09/29/2021   Meralgia paresthetica of right side 03/29/2021   Psoas tendinitis, right hip 03/29/2021   History of total right hip arthroplasty 03/29/2021   Status post total replacement of right hip 02/19/2021   Meralgia paresthetica of right side 02/19/2021   Polyp of ascending colon 02/19/2021   Polyp of sigmoid colon 02/19/2021   Special screening for malignant neoplasms, colon    Polyp of ascending colon    Polyp of sigmoid colon    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 03/23/2020   Pain in right hip 01/12/2019   Vitamin D  deficiency 02/25/2018   Chronic pain 10/24/2015   Hot flashes 10/24/2015  Migraine without aura and without status migrainosus, not intractable 10/24/2015   Metabolic syndrome 10/24/2015   Dyslipidemia 10/24/2015   Allergic rhinitis, seasonal 05/17/2015   Chronic insomnia 08/08/2014   DDD (degenerative disc disease), lumbar 08/08/2014   Mood disorder (HCC) 08/08/2014   Gastroesophageal reflux disease without esophagitis 08/08/2014   H/O total hysterectomy 08/08/2014   Irritable bowel syndrome with diarrhea 08/08/2014   Osteoarthritis of right hip 08/08/2014   Adult BMI 30+ 08/08/2014   SBO (spina bifida occulta) 08/08/2014   ONSET DATE: June 2024  REFERRING DIAG:  M19.041 (ICD-10-CM) - Arthritis of right hand   THERAPY DIAG:  Muscle weakness (generalized)  Arthritis of right hand  Pain in right hand  Rationale for Evaluation and Treatment: Rehabilitation  SUBJECTIVE:  SUBJECTIVE STATEMENT: Pt reports pain in R thumb MP and CMC was high yesterday, but improved today.  Pt wonders if the rain may have been the cause.   Pt accompanied by: self  PERTINENT HISTORY: Hx of R frozen shoulder post rotator cuff repair, arthritis in the hand, and palmar inflammation/thickening of flexor tendons in the palm.  OT encouraged pt inquire with Dr. Genelle about whether this could be Dupyutren's contracture.  Pt presents today with c/o of pain in R hand with Dx of arthritis.  Pain is specifically palmar side at the thenar eminence.  Pt reports pain has been constant for at least the last year.    PRECAUTIONS: General joint protection/cumulative trauma prevention   RED FLAGS: None   WEIGHT BEARING RESTRICTIONS: No  PAIN: 09/11/23: 2/10 pain R thumb  Are you having pain? Yes: NPRS scale: 2/10 pain at rest, up to 6-7/10 with activity Pain location: R hand/thenar eminence Pain description: achy Aggravating factors: general use of the hand, holding eating utensils, pinching Relieving factors: heat, massage, soft neoprene thumb spica  FALLS: Has patient fallen in last 6 months? 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  NEXT MD VISIT: Return to Dr. Genelle on Fri 08/09/23 for cortisone shot in the R shoulder   OBJECTIVE:  Note: Objective measures were completed at Evaluation unless otherwise noted.  HAND DOMINANCE: Right  ADLs: Difficulty opening containers and drink bottles, lifting heavy pots/pans, picking up grandson (18 months), computer/mouse use (work related tasks), writing/filling out forms,   FUNCTIONAL  OUTCOME MEASURES: MAM-20 for musculoskeletal conditions: 67/80  UPPER EXTREMITY ROM:  Full ROM in R hand digits, but pain with thumb flexion.  Able to oppose all digits to thumb in R hand, but pain with 4th and 5th digit opposition to thumb.  UPPER EXTREMITY MMT:    MMT Right eval Left eval  Shoulder flexion    Shoulder abduction    Shoulder adduction    Shoulder extension    Shoulder internal rotation    Shoulder external rotation    Middle trapezius    Lower trapezius    Elbow flexion    Elbow extension    Wrist flexion 5 5  Wrist extension 5 5  Wrist ulnar deviation 5 5  Wrist radial deviation 5 5  Wrist pronation    Wrist supination    (Blank rows = not tested)  HAND FUNCTION: Grip strength: Right: 35 lbs; Left: 45 lbs, Lateral pinch: Right: 11 lbs, Left: 14 lbs, and 3 point pinch: Right: 8 lbs, Left: 10 lbs  COORDINATION: 9 Hole Peg test: Right: 18  sec; Left: 19 sec  SENSATION: No tingling/numbness in R hand.  EDEMA: No visible edema  COGNITION: Overall cognitive status: Within functional limits for tasks assessed  OBSERVATIONS:  -Pt pleasant, cooperative, eager to work towards goals in OT poc.  -Thickened palmar/flexor tendons; encouraged pt to follow up with MD on Friday to inquire about whether this may be Dupuytren's contracture.  TREATMENT DATE: 09/11/23 Moist heat applied to R hand, used intermittently throughout session for pain management/muscle relaxation simultaneous to activities noted below.  Iontophoresis with dexamethasone , 1.5 cc (small patch): tx time: 22 min.  (7 of 10 planned treatments completed) -tx site: R volar thumb/thenar eminence/CMC, 40 mA/min, current amplitude 2.0 mA -Skin check performed; intact.  Will continue to monitor.   Manual Therapy: -Soft tissue massage with massage cream, completed for pain management/muscle relaxation to palmar hand, thenar eminence, and thumb abductors  Therapeutic Exercise: -Instructed in/completed  thumb isometrics all planes; 2 sets 5 reps each with a 3 sec hold.  Visual handout issued with good return demo. -Issued blue foam in place of yellow putty for gentle grip strengthening  PATIENT EDUCATION: Education details: HEP progression Person educated: Patient Education method: Explanation and Verbal cues Education comprehension: verbalized understanding  HOME EXERCISE PROGRAM: -Issued small stress cushion for light pinching/gripping.  Will plan to issue yellow or pink putty in follow up visit.   -pink theraputty for low reps of gripping/pinching to tolerance -08/21/23: downgraded to yellow theraputty -09/11/23: downgraded to blue foam for gentle grip strengthening to tolerance/low reps; isometric thumb strengthening all planes  GOALS: Goals reviewed with patient? Yes  SHORT TERM GOALS: Target date: 08/26/23  Pt will be indep to perform HEP for improving R hand strength for ADL/IADL/work related tasks and to maintain tissue extensibility in thickened flexor palmar tendons. Baseline: Eval: HEP initiated at eval; additional exercises will be added in upcoming sessions Goal status: INITIAL  2.  Pt will be indep to verbalize and implement 2-3 joint protection/cumulative trauma prevention strategies to reduce pain in the R hand.  Baseline: Eval: Education not yet initiated Goal status: INITIAL  LONG TERM GOALS: Target date: 09/13/23  Pt will increase MAM-20 score for musculoskeletal conditions by 9 or more points to indicate improvement in self perceived functional use of the R hand for daily tasks.   Baseline: Eval: 67/80 Goal status: INITIAL  2.  Pt will increase R grip strength by 10 or more lbs to improve ability to hold and carry heavy ADL supplies. Baseline: Eval: R 35 lbs (L 45 lbs) Goal status: INITIAL  3.  Pt will increase R lateral pinch strength by 5 or more lbs to ease ability to open drink bottles. Baseline: Eval: R 11 lbs (L 14 lbs) Goal status: INITIAL  4.  Pt  will tolerate manual therapy, therapeutic modalities, and exercises to decrease pain in R hand to a reported 0/10 pain at rest and 3/10 pain or      less with activity.  Baseline: Eval: 4/10 pain at rest, 6-7/10 pain with activity Goal status: INITIAL  ASSESSMENT:  CLINICAL IMPRESSION: 7th iontophoresis tx completed today with good tolerance.  Skin intact at tx site.  Pain in R thumb continues to fluctuate daily, despite pt being more mindful of body mechanics/joint protection strategies when using her hands.  HEP downgraded as noted above.  Pt continues to respond well to iontophoresis, heat modalities, and manual therapy.  Will continue to focus on isometric strengthening to increase thumb stability and progress resistance as tolerated.  Pt will continue to benefit from skilled OT to address above noted goals, with pt's primary goals being to increase strength and decrease pain in her R dominant hand in order to improve indep and tolerance to daily tasks.  Pt in agreement with OT poc.  PERFORMANCE DEFICITS: in functional skills including ADLs, IADLs, strength, pain, fascial restrictions, body mechanics, decreased knowledge of precautions, decreased knowledge of use of DME, and UE functional use, and psychosocial skills including coping strategies, environmental adaptation, habits, and routines and behaviors.   IMPAIRMENTS: are limiting patient from ADLs, IADLs, rest and sleep, work, leisure, and social participation.   COMORBIDITIES: has co-morbidities such as OA, R rotator cuff tear/repair, asthma that affects occupational performance. Patient will benefit from skilled OT to address above impairments and improve overall function.  MODIFICATION OR ASSISTANCE TO COMPLETE EVALUATION: No modification of tasks or assist necessary to complete an evaluation.  OT OCCUPATIONAL PROFILE AND HISTORY: Detailed assessment: Review of records and additional review of physical, cognitive, psychosocial history  related to current functional performance.  CLINICAL DECISION MAKING: Moderate - several treatment options, min-mod task modification necessary  REHAB POTENTIAL: Good  EVALUATION COMPLEXITY: Moderate      PLAN:  OT FREQUENCY: 2x/week  OT DURATION: 6 weeks  PLANNED INTERVENTIONS: 97168 OT Re-evaluation, 97535 self care/ADL training, 02889 therapeutic exercise, 97530 therapeutic activity, 97140 manual therapy, 97035 ultrasound, 97018 paraffin, 02989 moist heat, 97010 cryotherapy, 97034 contrast bath, 97033 iontophoresis, 97760 Orthotic Initial, 97763 Orthotic/Prosthetic subsequent, passive range of motion, psychosocial skills training, energy conservation, coping strategies training, patient/family education, and DME and/or AE instructions  RECOMMENDED OTHER SERVICES: None at this time  CONSULTED AND AGREED WITH PLAN OF CARE: Patient  PLAN FOR NEXT SESSION: iontophoresis, HEP progression, manual therapy  Inocente Blazing, MS, OTR/L  Inocente MARLA Blazing, OT 09/14/2023, 8:48 PM

## 2023-09-16 ENCOUNTER — Ambulatory Visit

## 2023-09-16 DIAGNOSIS — M6281 Muscle weakness (generalized): Secondary | ICD-10-CM

## 2023-09-16 DIAGNOSIS — M19041 Primary osteoarthritis, right hand: Secondary | ICD-10-CM

## 2023-09-16 DIAGNOSIS — M79641 Pain in right hand: Secondary | ICD-10-CM

## 2023-09-16 NOTE — Therapy (Signed)
 OUTPATIENT OCCUPATIONAL THERAPY ORTHO RECERTIFICATION AND TREATMENT NOTE  Patient Name: Shelby Henson MRN: [id number] DOB:Mar 22, 1970, 53 y.o., female Today's Date: 09/16/2023  PCP: Dr. Dorette Loron REFERRING PROVIDER: Dr. Elspeth Parker (ortho)  END OF SESSION:  OT End of Session - 09/16/23 1205     Visit Number 8    Number of Visits 20    Date for OT Re-Evaluation 10/28/23    OT Start Time 1150    OT Stop Time 1232    OT Time Calculation (min) 42 min    Activity Tolerance Patient tolerated treatment well    Behavior During Therapy WFL for tasks assessed/performed          Past Medical History:  Diagnosis Date   Anemia    Asthma    well controlled   Colon polyps    Complication of anesthesia    DDD (degenerative disc disease), lumbar    Depression    GERD (gastroesophageal reflux disease)    occ   Iron  deficiency anemia    Migraine    Morbid obesity (HCC)    Osteoarthritis    Osteoarthritis of right hip    PONV (postoperative nausea and vomiting)    with c-section x 1   Right rotator cuff tear 11/2022   Vitamin D  deficiency    Past Surgical History:  Procedure Laterality Date   BREAST CYST ASPIRATION Left    CESAREAN SECTION  1990   CESAREAN SECTION  1993   CESAREAN SECTION  1996   COLONOSCOPY N/A 09/22/2020   Procedure: COLONOSCOPY;  Surgeon: Janalyn Keene NOVAK, MD;  Location: [facility];  Service: Endoscopy;  Laterality: N/A;   LAPAROSCOPY  2018   LEEP  2005   benign pathology; Dr. Rolm   SHOULDER ACROMIOPLASTY Right 01/03/2023   Procedure: RIGHT SHOULDER ACROMIOPLASTY;  Surgeon: Parker Elspeth, MD;  Location: [facility];  Service: Orthopedics;  Laterality: Right;   SHOULDER ARTHROSCOPY WITH ROTATOR CUFF REPAIR AND OPEN BICEPS TENODESIS Right 01/03/2023   Procedure: RIGHT SHOULDER ARTHROSCOPY WITH ROTATOR CUFF REPAIR AND  BICEPS TENODESIS;  Surgeon: Parker Elspeth, MD;  Location: [facility];  Service: Orthopedics;  Laterality: Right;    SUPRACERVICAL ABDOMINAL HYSTERECTOMY  2010   leio/AUB; Dr. Rolm   TOTAL HIP ARTHROPLASTY Right 11/12/2018   Procedure: TOTAL HIP ARTHROPLASTY ANTERIOR APPROACH;  Surgeon: Leora Lynwood SAUNDERS, MD;  Location: [facility];  Service: Orthopedics;  Laterality: Right;   Patient Active Problem List   Diagnosis Date Noted   Severe persistent asthma without complication 03/29/2023   Nontraumatic incomplete tear of right rotator cuff 01/03/2023   Right rotator cuff tendonitis 09/11/2022   Iron  deficiency anemia due to dietary causes 08/10/2022   Chronic right shoulder pain 05/11/2022   Morbid obesity (HCC) 01/05/2022   Rotator cuff impingement syndrome, right 09/29/2021   Biceps tendinitis, right 09/29/2021   Meralgia paresthetica of right side 03/29/2021   Psoas tendinitis, right hip 03/29/2021   History of total right hip arthroplasty 03/29/2021   Status post total replacement of right hip 02/19/2021   Meralgia paresthetica of right side 02/19/2021   Polyp of ascending colon 02/19/2021   Polyp of sigmoid colon 02/19/2021   Special screening for malignant neoplasms, colon    Polyp of ascending colon    Polyp of sigmoid colon    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 03/23/2020   Pain in right hip 01/12/2019   Vitamin D  deficiency 02/25/2018   Chronic pain 10/24/2015   Hot flashes  10/24/2015   Migraine without aura and without status migrainosus, not intractable 10/24/2015   Metabolic syndrome 10/24/2015   Dyslipidemia 10/24/2015   Allergic rhinitis, seasonal 05/17/2015   Chronic insomnia 08/08/2014   DDD (degenerative disc disease), lumbar 08/08/2014   Mood disorder (HCC) 08/08/2014   Gastroesophageal reflux disease without esophagitis 08/08/2014   H/O total hysterectomy 08/08/2014   Irritable bowel syndrome with diarrhea 08/08/2014   Osteoarthritis of right hip 08/08/2014   Adult BMI 30+ 08/08/2014   SBO (spina bifida occulta) 08/08/2014   ONSET DATE: June  2024  REFERRING DIAG: M19.041 (ICD-10-CM) - Arthritis of right hand   THERAPY DIAG:  Muscle weakness (generalized)  Arthritis of right hand  Pain in right hand  Rationale for Evaluation and Treatment: Rehabilitation  SUBJECTIVE:  SUBJECTIVE STATEMENT: Pt reports she's been consistently performing her thumb isometric exercises since last week and her thumb feels good today. Pt accompanied by: self  PERTINENT HISTORY: Hx of R frozen shoulder post rotator cuff repair, arthritis in the hand, and palmar inflammation/thickening of flexor tendons in the palm.  OT encouraged pt inquire with Dr. Genelle about whether this could be Dupyutren's contracture.  Pt presents today with c/o of pain in R hand with Dx of arthritis.  Pain is specifically palmar side at the thenar eminence.  Pt reports pain has been constant for at least the last year.    PRECAUTIONS: General joint protection/cumulative trauma prevention   RED FLAGS: None   WEIGHT BEARING RESTRICTIONS: No  PAIN: 09/16/23: 1/10 pain R thumb at rest, up to 3-4/10 pain with activity  Are you having pain? Yes: NPRS scale: 2/10 pain at rest, up to 6-7/10 with activity Pain location: R hand/thenar eminence Pain description: achy Aggravating factors: general use of the hand, holding eating utensils, pinching Relieving factors: heat, massage, soft neoprene thumb spica  FALLS: Has patient fallen in last 6 months? 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  NEXT MD VISIT: Return to Dr. Genelle on Fri 08/09/23 for cortisone shot in the R shoulder   OBJECTIVE:  Note: Objective measures were completed at Evaluation unless otherwise noted.  HAND DOMINANCE: Right  ADLs: Difficulty opening containers and drink bottles, lifting heavy pots/pans, picking up grandson (18 months), computer/mouse use (work  related tasks), writing/filling out forms,   FUNCTIONAL OUTCOME MEASURES: MAM-20 for musculoskeletal conditions: 67/80 09/16/23: 67/80  UPPER EXTREMITY ROM:  Full ROM in R hand digits, but pain with thumb flexion.  Able to oppose all digits to thumb in R hand, but pain with 4th and 5th digit opposition to thumb.  UPPER EXTREMITY MMT:    MMT Right eval Left eval  Shoulder flexion    Shoulder abduction    Shoulder adduction    Shoulder extension    Shoulder internal rotation    Shoulder external rotation    Middle trapezius    Lower trapezius    Elbow flexion    Elbow extension    Wrist flexion 5 5  Wrist extension 5 5  Wrist ulnar deviation 5 5  Wrist radial deviation 5 5  Wrist pronation    Wrist supination    (Blank rows = not tested)  HAND FUNCTION: Grip strength: Right: 35 lbs; Left: 45 lbs, Lateral pinch: Right: 11 lbs, Left: 14 lbs, and 3 point pinch: Right: 8 lbs, Left: 10 lbs 09/16/23: Grip strength: Right:  40 lbs; Left: 45 lbs, Lateral pinch: Right: 11 lbs, Left: 14 lbs, and 3 point pinch: Right: 9 lbs, Left: 11 lbs  COORDINATION: 9 Hole Peg test: Right: 18 sec; Left: 19 sec  SENSATION: No tingling/numbness in R hand.  EDEMA: No visible edema  COGNITION: Overall cognitive status: Within functional limits for tasks assessed  OBSERVATIONS:  -Pt pleasant, cooperative, eager to work towards goals in OT poc.  -Thickened palmar/flexor tendons; encouraged pt to follow up with MD on Friday to inquire about whether this may be Dupuytren's contracture.  TREATMENT DATE: 09/16/23 Moist heat applied to R hand, used intermittently throughout session for pain management/muscle relaxation simultaneous to activities noted below.  Iontophoresis with dexamethasone , 1.5 cc (small patch): tx time: 22 min.  (8 of 10 planned treatments completed) -tx site: R volar thumb/thenar eminence/CMC, 40 mA/min, current amplitude 2.0 mA -Skin check performed; intact.  Will continue to  monitor.   Manual Therapy: -Soft tissue massage with massage cream, completed for pain management/muscle relaxation to palmar hand, thenar eminence, and thumb abductors  Therapeutic Exercise: -Completed thumb isometrics all planes; 2 sets 10 reps each with a 3 sec hold.  Min vc for form/technique with good ability to tolerate moderate resistance.  -Objective measures taken and goals updated and reviewed for recert.  PATIENT EDUCATION: Education details: HEP, progress towards goals Person educated: Patient Education method: Explanation and Verbal cues Education comprehension: verbalized understanding  HOME EXERCISE PROGRAM: -Issued small stress cushion for light pinching/gripping.  Will plan to issue yellow or pink putty in follow up visit.   -pink theraputty for low reps of gripping/pinching to tolerance -08/21/23: downgraded to yellow theraputty -09/11/23: downgraded to blue foam for gentle grip strengthening to tolerance/low reps; isometric thumb strengthening all planes  GOALS: Goals reviewed with patient? Yes  SHORT TERM GOALS: Target date: 10/07/23  Pt will be indep to perform HEP for improving R hand strength for ADL/IADL/work related tasks and to maintain tissue extensibility in thickened flexor palmar tendons. Baseline: Eval: HEP initiated at eval; additional exercises will be added in upcoming sessions; 09/16/23: ongoing progressions; pt currently requires min vc for isometric technique to prevent any active range  Goal status: in progress  2.  Pt will be indep to verbalize and implement 2-3 joint protection/cumulative trauma prevention strategies to reduce pain in the R hand.  Baseline: Eval: Education not yet initiated; 09/16/23: Pt is using built up foam pen, allowing more frequent rest breaks with activity, and remembering to limit pinching with thumb, but rather pressing into objects with palm when able Goal status: achieved  LONG TERM GOALS: Target date: 09/13/23  Pt will  increase MAM-20 score for musculoskeletal conditions by 9 or more points to indicate improvement in self perceived functional use of the R hand for daily tasks.   Baseline: Eval: 67/80; 09/16/23: same as eval Goal status: in progress  2.  Pt will increase R grip strength by 10 or more lbs to improve ability to hold and carry heavy ADL supplies. Baseline: Eval: R 35 lbs (L 45 lbs); 09/16/23: 40 lbs (L 45 lbs) Goal status: in progress  3.  Pt will increase R lateral pinch strength by 5 or more lbs to ease ability to open drink bottles. Baseline: Eval: R 11 lbs (L 14 lbs); 09/16/23: R 12 lbs (L 15 lbs), and using dycem which is also helping to open twist top containers Goal status: in progress  4.  Pt will tolerate manual therapy, therapeutic modalities, and exercises to  decrease pain in R hand to a reported 0/10 pain at rest and 3/10 pain or      less with activity.  Baseline: Eval: 4/10 pain at rest, 6-7/10 pain with activity; 09/16/23: 1/10 pain at rest, up to 5/10 with activity  Goal status: in progress   ASSESSMENT:  CLINICAL IMPRESSION: OT recertification visit completed this session and 8th iontophoresis tx completed with good tolerance.  Skin intact at tx site.  Pain in R thumb continues to fluctuate based on daily activities, but both improved at rest and with activity as compared to the last week and at eval.  Pt is verbalizing improved awareness and implementation to joint protections strategies for bilat hands.  Pt started isometric strengthening for R thumb last week, and reports good adherence to complete since last visit, reporting no pain with these.  Soft clicking in the R thumb CMC palpable and audible, specifically with radial abd, but pt reports no pain with this.  Clicking resolves when pt is able to complete a true isometric hold without any active range when given min vc for technique.  Despite beginning isometric strengthening just last week and having to avoid AROM with  resistance d/t pain, R hand grip and pinch strength measures have improved.  Planning to complete 2 more treatments of iontophoresis to the R volar thumb, continue to provide manual therapy and heat modalities for pain management, and progress R hand strengthening exercises as tolerated.  Pt will continue to benefit from skilled OT to address above noted goals, with pt's primary goals being to increase strength and decrease pain in her R dominant hand in order to improve indep and tolerance to daily tasks.  Pt in agreement with OT poc.  PERFORMANCE DEFICITS: in functional skills including ADLs, IADLs, strength, pain, fascial restrictions, body mechanics, decreased knowledge of precautions, decreased knowledge of use of DME, and UE functional use, and psychosocial skills including coping strategies, environmental adaptation, habits, and routines and behaviors.   IMPAIRMENTS: are limiting patient from ADLs, IADLs, rest and sleep, work, leisure, and social participation.   COMORBIDITIES: has co-morbidities such as OA, R rotator cuff tear/repair, asthma that affects occupational performance. Patient will benefit from skilled OT to address above impairments and improve overall function.  MODIFICATION OR ASSISTANCE TO COMPLETE EVALUATION: No modification of tasks or assist necessary to complete an evaluation.  OT OCCUPATIONAL PROFILE AND HISTORY: Detailed assessment: Review of records and additional review of physical, cognitive, psychosocial history related to current functional performance.  CLINICAL DECISION MAKING: Moderate - several treatment options, min-mod task modification necessary  REHAB POTENTIAL: Good  EVALUATION COMPLEXITY: Moderate      PLAN:  OT FREQUENCY: 2x/week  OT DURATION: 6 weeks  PLANNED INTERVENTIONS: 97168 OT Re-evaluation, 97535 self care/ADL training, 02889 therapeutic exercise, 97530 therapeutic activity, 97140 manual therapy, 97035 ultrasound, 97018 paraffin, 02989  moist heat, 97010 cryotherapy, 97034 contrast bath, 97033 iontophoresis, 97760 Orthotic Initial, 97763 Orthotic/Prosthetic subsequent, passive range of motion, psychosocial skills training, energy conservation, coping strategies training, patient/family education, and DME and/or AE instructions  RECOMMENDED OTHER SERVICES: None at this time  CONSULTED AND AGREED WITH PLAN OF CARE: Patient  PLAN FOR NEXT SESSION: iontophoresis, HEP progression, manual therapy  Inocente Blazing, MS, OTR/L  Inocente MARLA Blazing, OT 09/16/2023, 1:20 PM

## 2023-09-17 ENCOUNTER — Encounter

## 2023-09-17 ENCOUNTER — Encounter (HOSPITAL_BASED_OUTPATIENT_CLINIC_OR_DEPARTMENT_OTHER): Payer: Self-pay | Admitting: Orthopaedic Surgery

## 2023-09-18 ENCOUNTER — Encounter: Payer: Self-pay | Admitting: Family Medicine

## 2023-09-18 DIAGNOSIS — G5711 Meralgia paresthetica, right lower limb: Secondary | ICD-10-CM

## 2023-09-19 ENCOUNTER — Ambulatory Visit

## 2023-09-19 ENCOUNTER — Other Ambulatory Visit: Payer: Self-pay | Admitting: Family Medicine

## 2023-09-19 ENCOUNTER — Other Ambulatory Visit: Payer: Self-pay

## 2023-09-19 DIAGNOSIS — M6281 Muscle weakness (generalized): Secondary | ICD-10-CM | POA: Diagnosis not present

## 2023-09-19 DIAGNOSIS — M79641 Pain in right hand: Secondary | ICD-10-CM

## 2023-09-19 DIAGNOSIS — M19041 Primary osteoarthritis, right hand: Secondary | ICD-10-CM | POA: Diagnosis not present

## 2023-09-19 DIAGNOSIS — G5711 Meralgia paresthetica, right lower limb: Secondary | ICD-10-CM

## 2023-09-19 MED ORDER — GABAPENTIN 300 MG PO CAPS
900.0000 mg | ORAL_CAPSULE | Freq: Every day | ORAL | 1 refills | Status: AC
Start: 2023-09-19 — End: ?
  Filled 2023-09-19: qty 270, 90d supply, fill #0

## 2023-09-21 NOTE — Therapy (Signed)
 OUTPATIENT OCCUPATIONAL THERAPY ORTHO TREATMENT NOTE  Patient Name: Shelby Henson MRN: [id number] DOB:[birth year], 53 y.o., female Today's Date: 09/21/2023  PCP: Dr. Dorette Loron REFERRING PROVIDER: Dr. Elspeth Parker (ortho)  END OF SESSION:  OT End of Session - 09/21/23 2049     Visit Number 9    Number of Visits 20    Date for OT Re-Evaluation 10/28/23    OT Start Time 1015    OT Stop Time 1100    OT Time Calculation (min) 45 min    Activity Tolerance Patient tolerated treatment well    Behavior During Therapy WFL for tasks assessed/performed          Past Medical History:  Diagnosis Date   Anemia    Asthma    well controlled   Colon polyps    Complication of anesthesia    DDD (degenerative disc disease), lumbar    Depression    GERD (gastroesophageal reflux disease)    occ   Iron  deficiency anemia    Migraine    Morbid obesity (HCC)    Osteoarthritis    Osteoarthritis of right hip    PONV (postoperative nausea and vomiting)    with c-section x 1   Right rotator cuff tear 11/2022   Vitamin D  deficiency    Past Surgical History:  Procedure Laterality Date   BREAST CYST ASPIRATION Left    CESAREAN SECTION  1990   CESAREAN SECTION  1993   CESAREAN SECTION  1996   COLONOSCOPY N/A 09/22/2020   Procedure: COLONOSCOPY;  Surgeon: Janalyn Keene NOVAK, MD;  Location: [facility];  Service: Endoscopy;  Laterality: N/A;   LAPAROSCOPY  2018   LEEP  2005   benign pathology; Dr. Rolm   SHOULDER ACROMIOPLASTY Right 01/03/2023   Procedure: RIGHT SHOULDER ACROMIOPLASTY;  Surgeon: Parker Elspeth, MD;  Location: [facility];  Service: Orthopedics;  Laterality: Right;   SHOULDER ARTHROSCOPY WITH ROTATOR CUFF REPAIR AND OPEN BICEPS TENODESIS Right 01/03/2023   Procedure: RIGHT SHOULDER ARTHROSCOPY WITH ROTATOR CUFF REPAIR AND  BICEPS TENODESIS;  Surgeon: Parker Elspeth, MD;  Location: [facility];  Service: Orthopedics;  Laterality: Right;   SUPRACERVICAL ABDOMINAL  HYSTERECTOMY  2010   leio/AUB; Dr. Rolm   TOTAL HIP ARTHROPLASTY Right 11/12/2018   Procedure: TOTAL HIP ARTHROPLASTY ANTERIOR APPROACH;  Surgeon: Leora Lynwood SAUNDERS, MD;  Location: [facility];  Service: Orthopedics;  Laterality: Right;   Patient Active Problem List   Diagnosis Date Noted   Severe persistent asthma without complication 03/29/2023   Nontraumatic incomplete tear of right rotator cuff 01/03/2023   Right rotator cuff tendonitis 09/11/2022   Iron  deficiency anemia due to dietary causes 08/10/2022   Chronic right shoulder pain 05/11/2022   Morbid obesity (HCC) 01/05/2022   Rotator cuff impingement syndrome, right 09/29/2021   Biceps tendinitis, right 09/29/2021   Meralgia paresthetica of right side 03/29/2021   Psoas tendinitis, right hip 03/29/2021   History of total right hip arthroplasty 03/29/2021   Status post total replacement of right hip 02/19/2021   Meralgia paresthetica of right side 02/19/2021   Polyp of ascending colon 02/19/2021   Polyp of sigmoid colon 02/19/2021   Special screening for malignant neoplasms, colon    Polyp of ascending colon    Polyp of sigmoid colon    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 03/23/2020   Pain in right hip 01/12/2019   Vitamin D  deficiency 02/25/2018   Chronic pain 10/24/2015   Hot flashes 10/24/2015  Migraine without aura and without status migrainosus, not intractable 10/24/2015   Metabolic syndrome 10/24/2015   Dyslipidemia 10/24/2015   Allergic rhinitis, seasonal 05/17/2015   Chronic insomnia 08/08/2014   DDD (degenerative disc disease), lumbar 08/08/2014   Mood disorder (HCC) 08/08/2014   Gastroesophageal reflux disease without esophagitis 08/08/2014   H/O total hysterectomy 08/08/2014   Irritable bowel syndrome with diarrhea 08/08/2014   Osteoarthritis of right hip 08/08/2014   Adult BMI 30+ 08/08/2014   SBO (spina bifida occulta) 08/08/2014   ONSET DATE: June 2024  REFERRING DIAG:  M19.041 (ICD-10-CM) - Arthritis of right hand   THERAPY DIAG:  Muscle weakness (generalized)  Arthritis of right hand  Pain in right hand  Rationale for Evaluation and Treatment: Rehabilitation  SUBJECTIVE:  SUBJECTIVE STATEMENT: Pt reports noticing that she was able to lay her R hand more flat today, noting improved thumb flexibility on the R.  Pt accompanied by: self  PERTINENT HISTORY: Hx of R frozen shoulder post rotator cuff repair, arthritis in the hand, and palmar inflammation/thickening of flexor tendons in the palm.  OT encouraged pt inquire with Dr. Genelle about whether this could be Dupyutren's contracture.  Pt presents today with c/o of pain in R hand with Dx of arthritis.  Pain is specifically palmar side at the thenar eminence.  Pt reports pain has been constant for at least the last year.    PRECAUTIONS: General joint protection/cumulative trauma prevention   RED FLAGS: None   WEIGHT BEARING RESTRICTIONS: No  PAIN: 09/19/23: 2-3/10 pain R thumb at rest, up to 3-4/10 pain with activity  Are you having pain? Yes: NPRS scale: 2/10 pain at rest, up to 6-7/10 with activity Pain location: R hand/thenar eminence Pain description: achy Aggravating factors: general use of the hand, holding eating utensils, pinching Relieving factors: heat, massage, soft neoprene thumb spica  FALLS: Has patient fallen in last 6 months? 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  NEXT MD VISIT: Return to Dr. Genelle on Fri 08/09/23 for cortisone shot in the R shoulder   OBJECTIVE:  Note: Objective measures were completed at Evaluation unless otherwise noted.  HAND DOMINANCE: Right  ADLs: Difficulty opening containers and drink bottles, lifting heavy pots/pans, picking up grandson (18 months), computer/mouse use (work related tasks),  writing/filling out forms,   FUNCTIONAL OUTCOME MEASURES: MAM-20 for musculoskeletal conditions: 67/80 09/16/23: 67/80  UPPER EXTREMITY ROM:  Full ROM in R hand digits, but pain with thumb flexion.  Able to oppose all digits to thumb in R hand, but pain with 4th and 5th digit opposition to thumb.  UPPER EXTREMITY MMT:    MMT Right eval Left eval  Shoulder flexion    Shoulder abduction    Shoulder adduction    Shoulder extension    Shoulder internal rotation    Shoulder external rotation    Middle trapezius    Lower trapezius    Elbow flexion    Elbow extension    Wrist flexion 5 5  Wrist extension 5 5  Wrist ulnar deviation 5 5  Wrist radial deviation 5 5  Wrist pronation    Wrist supination    (Blank rows = not tested)  HAND FUNCTION: Grip strength: Right: 35 lbs; Left: 45 lbs, Lateral pinch: Right: 11 lbs, Left: 14 lbs, and 3 point pinch: Right: 8 lbs, Left: 10 lbs 09/16/23: Grip strength:  Right: 40 lbs; Left: 45 lbs, Lateral pinch: Right: 11 lbs, Left: 14 lbs, and 3 point pinch: Right: 9 lbs, Left: 11 lbs  COORDINATION: 9 Hole Peg test: Right: 18 sec; Left: 19 sec  SENSATION: No tingling/numbness in R hand.  EDEMA: No visible edema  COGNITION: Overall cognitive status: Within functional limits for tasks assessed  OBSERVATIONS:  -Pt pleasant, cooperative, eager to work towards goals in OT poc.  -Thickened palmar/flexor tendons; encouraged pt to follow up with MD on Friday to inquire about whether this may be Dupuytren's contracture.  TREATMENT DATE: 09/19/23 Moist heat applied to R hand, used intermittently throughout session for pain management/muscle relaxation simultaneous to activities noted below.  Iontophoresis with dexamethasone , 1.5 cc (small patch): tx time: 22 min.  (9 of 10 planned treatments completed) -tx site: R volar thumb/thenar eminence/CMC, 40 mA/min, current amplitude 2.0 mA -Skin check performed; mild blistering at patch site.  Will continue  to monitor.   Manual Therapy: -Soft tissue massage with massage cream, completed for pain management/muscle relaxation to palmar hand, thenar eminence, and thumb abductors  Therapeutic Exercise: -Completed thumb isometrics all planes; 2 sets 10 reps each with a 3 sec hold.  Min vc for form/technique with good ability to tolerate moderate resistance.  Reduced resistance 2nd set d/t CMC clicking.   PATIENT EDUCATION: Education details: HEP Person educated: Patient Education method: Leisure centre manager cues Education comprehension: verbalized understanding  HOME EXERCISE PROGRAM: -Issued small stress cushion for light pinching/gripping.  Will plan to issue yellow or pink putty in follow up visit.   -pink theraputty for low reps of gripping/pinching to tolerance -08/21/23: downgraded to yellow theraputty -09/11/23: downgraded to blue foam for gentle grip strengthening to tolerance/low reps; isometric thumb strengthening all planes  GOALS: Goals reviewed with patient? Yes  SHORT TERM GOALS: Target date: 10/07/23  Pt will be indep to perform HEP for improving R hand strength for ADL/IADL/work related tasks and to maintain tissue extensibility in thickened flexor palmar tendons. Baseline: Eval: HEP initiated at eval; additional exercises will be added in upcoming sessions; 09/16/23: ongoing progressions; pt currently requires min vc for isometric technique to prevent any active range  Goal status: in progress  2.  Pt will be indep to verbalize and implement 2-3 joint protection/cumulative trauma prevention strategies to reduce pain in the R hand.  Baseline: Eval: Education not yet initiated; 09/16/23: Pt is using built up foam pen, allowing more frequent rest breaks with activity, and remembering to limit pinching with thumb, but rather pressing into objects with palm when able Goal status: achieved  LONG TERM GOALS: Target date: 09/13/23  Pt will increase MAM-20 score for musculoskeletal  conditions by 9 or more points to indicate improvement in self perceived functional use of the R hand for daily tasks.   Baseline: Eval: 67/80; 09/16/23: same as eval Goal status: in progress  2.  Pt will increase R grip strength by 10 or more lbs to improve ability to hold and carry heavy ADL supplies. Baseline: Eval: R 35 lbs (L 45 lbs); 09/16/23: 40 lbs (L 45 lbs) Goal status: in progress  3.  Pt will increase R lateral pinch strength by 5 or more lbs to ease ability to open drink bottles. Baseline: Eval: R 11 lbs (L 14 lbs); 09/16/23: R 12 lbs (L 15 lbs), and using dycem which is also helping to open twist top containers Goal status: in progress  4.  Pt will tolerate manual therapy, therapeutic modalities, and exercises to  decrease pain in R hand to a reported 0/10 pain at rest and 3/10 pain or      less with activity.  Baseline: Eval: 4/10 pain at rest, 6-7/10 pain with activity; 09/16/23: 1/10 pain at rest, up to 5/10 with activity  Goal status: in progress   ASSESSMENT:  CLINICAL IMPRESSION: 9th iontophoresis tx completed with good tolerance.  Light blistering at patch site following tx; will continue to monitor.  Reduced resistance with isometric strengthening to R thumb after 1st set d/t R thumb CMC clicking/popping, though pt reported no pain.  Clicking mostly stopped with less resistance and better stability to ensure no joint movement during each contraction/hold.  Pt reports noticing that she was able to lay her R hand more flat today, noting improved thumb flexibility on the R, with manual therapy having targeted elongation of thenar eminence musculature.  Pt will continue to benefit from skilled OT to address above noted goals, with pt's primary goals being to increase strength and decrease pain in her R dominant hand in order to improve indep and tolerance to daily tasks.  Pt in agreement with OT poc.  PERFORMANCE DEFICITS: in functional skills including ADLs, IADLs, strength, pain,  fascial restrictions, body mechanics, decreased knowledge of precautions, decreased knowledge of use of DME, and UE functional use, and psychosocial skills including coping strategies, environmental adaptation, habits, and routines and behaviors.   IMPAIRMENTS: are limiting patient from ADLs, IADLs, rest and sleep, work, leisure, and social participation.   COMORBIDITIES: has co-morbidities such as OA, R rotator cuff tear/repair, asthma that affects occupational performance. Patient will benefit from skilled OT to address above impairments and improve overall function.  MODIFICATION OR ASSISTANCE TO COMPLETE EVALUATION: No modification of tasks or assist necessary to complete an evaluation.  OT OCCUPATIONAL PROFILE AND HISTORY: Detailed assessment: Review of records and additional review of physical, cognitive, psychosocial history related to current functional performance.  CLINICAL DECISION MAKING: Moderate - several treatment options, min-mod task modification necessary  REHAB POTENTIAL: Good  EVALUATION COMPLEXITY: Moderate      PLAN:  OT FREQUENCY: 2x/week  OT DURATION: 6 weeks  PLANNED INTERVENTIONS: 97168 OT Re-evaluation, 97535 self care/ADL training, 02889 therapeutic exercise, 97530 therapeutic activity, 97140 manual therapy, 97035 ultrasound, 97018 paraffin, 02989 moist heat, 97010 cryotherapy, 97034 contrast bath, 97033 iontophoresis, 97760 Orthotic Initial, 97763 Orthotic/Prosthetic subsequent, passive range of motion, psychosocial skills training, energy conservation, coping strategies training, patient/family education, and DME and/or AE instructions  RECOMMENDED OTHER SERVICES: None at this time  CONSULTED AND AGREED WITH PLAN OF CARE: Patient  PLAN FOR NEXT SESSION: iontophoresis, HEP progression, manual therapy  Inocente Blazing, MS, OTR/L  Inocente MARLA Blazing, OT 09/21/2023, 8:51 PM

## 2023-09-23 ENCOUNTER — Encounter (HOSPITAL_BASED_OUTPATIENT_CLINIC_OR_DEPARTMENT_OTHER): Payer: Self-pay | Admitting: Orthopaedic Surgery

## 2023-09-23 ENCOUNTER — Ambulatory Visit

## 2023-09-23 DIAGNOSIS — M6281 Muscle weakness (generalized): Secondary | ICD-10-CM

## 2023-09-23 DIAGNOSIS — M19041 Primary osteoarthritis, right hand: Secondary | ICD-10-CM

## 2023-09-23 DIAGNOSIS — M79641 Pain in right hand: Secondary | ICD-10-CM | POA: Diagnosis not present

## 2023-09-23 NOTE — Therapy (Unsigned)
 OUTPATIENT OCCUPATIONAL THERAPY ORTHO TREATMENT NOTE  Patient Name: Shelby Henson MRN: [id number] DOB:[birth year], 53 y.o., female Today's Date: 09/23/2023  PCP: Dr. Dorette Loron REFERRING PROVIDER: Dr. Elspeth Parker (ortho)  END OF SESSION:  OT End of Session - 09/23/23 1158     Visit Number 10    Number of Visits 20    Date for OT Re-Evaluation 10/28/23    OT Start Time 1150    OT Stop Time 1230    OT Time Calculation (min) 40 min    Activity Tolerance Patient tolerated treatment well    Behavior During Therapy WFL for tasks assessed/performed         Past Medical History:  Diagnosis Date   Anemia    Asthma    well controlled   Colon polyps    Complication of anesthesia    DDD (degenerative disc disease), lumbar    Depression    GERD (gastroesophageal reflux disease)    occ   Iron  deficiency anemia    Migraine    Morbid obesity (HCC)    Osteoarthritis    Osteoarthritis of right hip    PONV (postoperative nausea and vomiting)    with c-section x 1   Right rotator cuff tear 11/2022   Vitamin D  deficiency    Past Surgical History:  Procedure Laterality Date   BREAST CYST ASPIRATION Left    CESAREAN SECTION  1990   CESAREAN SECTION  1993   CESAREAN SECTION  1996   COLONOSCOPY N/A 09/22/2020   Procedure: COLONOSCOPY;  Surgeon: Janalyn Keene NOVAK, MD;  Location: [facility];  Service: Endoscopy;  Laterality: N/A;   LAPAROSCOPY  2018   LEEP  2005   benign pathology; Dr. Rolm   SHOULDER ACROMIOPLASTY Right 01/03/2023   Procedure: RIGHT SHOULDER ACROMIOPLASTY;  Surgeon: Parker Elspeth, MD;  Location: [facility];  Service: Orthopedics;  Laterality: Right;   SHOULDER ARTHROSCOPY WITH ROTATOR CUFF REPAIR AND OPEN BICEPS TENODESIS Right 01/03/2023   Procedure: RIGHT SHOULDER ARTHROSCOPY WITH ROTATOR CUFF REPAIR AND  BICEPS TENODESIS;  Surgeon: Parker Elspeth, MD;  Location: [facility];  Service: Orthopedics;  Laterality: Right;   SUPRACERVICAL ABDOMINAL  HYSTERECTOMY  2010   leio/AUB; Dr. Rolm   TOTAL HIP ARTHROPLASTY Right 11/12/2018   Procedure: TOTAL HIP ARTHROPLASTY ANTERIOR APPROACH;  Surgeon: Leora Lynwood SAUNDERS, MD;  Location: [facility];  Service: Orthopedics;  Laterality: Right;   Patient Active Problem List   Diagnosis Date Noted   Severe persistent asthma without complication 03/29/2023   Nontraumatic incomplete tear of right rotator cuff 01/03/2023   Right rotator cuff tendonitis 09/11/2022   Iron  deficiency anemia due to dietary causes 08/10/2022   Chronic right shoulder pain 05/11/2022   Morbid obesity (HCC) 01/05/2022   Rotator cuff impingement syndrome, right 09/29/2021   Biceps tendinitis, right 09/29/2021   Meralgia paresthetica of right side 03/29/2021   Psoas tendinitis, right hip 03/29/2021   History of total right hip arthroplasty 03/29/2021   Status post total replacement of right hip 02/19/2021   Meralgia paresthetica of right side 02/19/2021   Polyp of ascending colon 02/19/2021   Polyp of sigmoid colon 02/19/2021   Special screening for malignant neoplasms, colon    Polyp of ascending colon    Polyp of sigmoid colon    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 03/23/2020   Pain in right hip 01/12/2019   Vitamin D  deficiency 02/25/2018   Chronic pain 10/24/2015   Hot flashes 10/24/2015  Migraine without aura and without status migrainosus, not intractable 10/24/2015   Metabolic syndrome 10/24/2015   Dyslipidemia 10/24/2015   Allergic rhinitis, seasonal 05/17/2015   Chronic insomnia 08/08/2014   DDD (degenerative disc disease), lumbar 08/08/2014   Mood disorder (HCC) 08/08/2014   Gastroesophageal reflux disease without esophagitis 08/08/2014   H/O total hysterectomy 08/08/2014   Irritable bowel syndrome with diarrhea 08/08/2014   Osteoarthritis of right hip 08/08/2014   Adult BMI 30+ 08/08/2014   SBO (spina bifida occulta) 08/08/2014   ONSET DATE: June 2024  REFERRING DIAG:  M19.041 (ICD-10-CM) - Arthritis of right hand   THERAPY DIAG:  No diagnosis found.  Rationale for Evaluation and Treatment: Rehabilitation  SUBJECTIVE:  SUBJECTIVE STATEMENT: Pt reports noticing that she was able to lay her R hand more flat today, noting improved thumb flexibility on the R.  Pt accompanied by: self  PERTINENT HISTORY: Hx of R frozen shoulder post rotator cuff repair, arthritis in the hand, and palmar inflammation/thickening of flexor tendons in the palm.  OT encouraged pt inquire with Dr. Genelle about whether this could be Dupyutren's contracture.  Pt presents today with c/o of pain in R hand with Dx of arthritis.  Pain is specifically palmar side at the thenar eminence.  Pt reports pain has been constant for at least the last year.    PRECAUTIONS: General joint protection/cumulative trauma prevention   RED FLAGS: None   WEIGHT BEARING RESTRICTIONS: No  PAIN: 09/19/23: 2-3/10 pain R thumb at rest, up to 3-4/10 pain with activity  Are you having pain? Yes: NPRS scale: 2/10 pain at rest, up to 6-7/10 with activity Pain location: R hand/thenar eminence Pain description: achy Aggravating factors: general use of the hand, holding eating utensils, pinching Relieving factors: heat, massage, soft neoprene thumb spica  FALLS: Has patient fallen in last 6 months? 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  NEXT MD VISIT: Return to Dr. Genelle on Fri 08/09/23 for cortisone shot in the R shoulder   OBJECTIVE:  Note: Objective measures were completed at Evaluation unless otherwise noted.  HAND DOMINANCE: Right  ADLs: Difficulty opening containers and drink bottles, lifting heavy pots/pans, picking up grandson (18 months), computer/mouse use (work related tasks), writing/filling out forms,   FUNCTIONAL OUTCOME MEASURES: MAM-20 for  musculoskeletal conditions: 67/80 09/16/23: 67/80  UPPER EXTREMITY ROM:  Full ROM in R hand digits, but pain with thumb flexion.  Able to oppose all digits to thumb in R hand, but pain with 4th and 5th digit opposition to thumb.  UPPER EXTREMITY MMT:    MMT Right eval Left eval  Shoulder flexion    Shoulder abduction    Shoulder adduction    Shoulder extension    Shoulder internal rotation    Shoulder external rotation    Middle trapezius    Lower trapezius    Elbow flexion    Elbow extension    Wrist flexion 5 5  Wrist extension 5 5  Wrist ulnar deviation 5 5  Wrist radial deviation 5 5  Wrist pronation    Wrist supination    (Blank rows = not tested)  HAND FUNCTION: Grip strength: Right: 35 lbs; Left: 45 lbs, Lateral pinch: Right: 11 lbs, Left: 14 lbs, and 3 point pinch: Right: 8 lbs, Left: 10 lbs 09/16/23: Grip strength: Right: 40 lbs; Left: 45 lbs, Lateral pinch: Right: 11  lbs, Left: 14 lbs, and 3 point pinch: Right: 9 lbs, Left: 11 lbs 09/23/23: R lateral: 13 lbs, L 15 lbs, 3 point pinch: Right: 10 lbs,  Left: 11 lbs   COORDINATION: 9 Hole Peg test: Right: 18 sec; Left: 19 sec  SENSATION: No tingling/numbness in R hand.  EDEMA: No visible edema  COGNITION: Overall cognitive status: Within functional limits for tasks assessed  OBSERVATIONS:  -Pt pleasant, cooperative, eager to work towards goals in OT poc.  -Thickened palmar/flexor tendons; encouraged pt to follow up with MD on Friday to inquire about whether this may be Dupuytren's contracture.  TREATMENT DATE: 09/19/23 Moist heat applied to R hand, used intermittently throughout session for pain management/muscle relaxation simultaneous to activities noted below.  Iontophoresis with dexamethasone , 1.5 cc (small patch): tx time: 22 min.  (9 of 10 planned treatments completed) -tx site: R volar thumb/thenar eminence/CMC, 40 mA/min, current amplitude 2.0 mA -Skin check performed; mild blistering at patch site.   Will continue to monitor.   Manual Therapy: -Soft tissue massage with massage cream, completed for pain management/muscle relaxation to palmar hand, thenar eminence, and thumb abductors  Therapeutic Exercise: -Completed thumb isometrics all planes; 2 sets 10 reps each with a 3 sec hold.  Min vc for form/technique with good ability to tolerate moderate resistance.  Reduced resistance 2nd set d/t CMC clicking.   PATIENT EDUCATION: Education details: HEP Person educated: Patient Education method: Leisure centre manager cues Education comprehension: verbalized understanding  HOME EXERCISE PROGRAM: -Issued small stress cushion for light pinching/gripping.  Will plan to issue yellow or pink putty in follow up visit.   -pink theraputty for low reps of gripping/pinching to tolerance -08/21/23: downgraded to yellow theraputty -09/11/23: downgraded to blue foam for gentle grip strengthening to tolerance/low reps; isometric thumb strengthening all planes  GOALS: Goals reviewed with patient? Yes  SHORT TERM GOALS: Target date: 10/07/23  Pt will be indep to perform HEP for improving R hand strength for ADL/IADL/work related tasks and to maintain tissue extensibility in thickened flexor palmar tendons. Baseline: Eval: HEP initiated at eval; additional exercises will be added in upcoming sessions; 09/16/23: ongoing progressions; pt currently requires min vc for isometric technique to prevent any active range  Goal status: in progress  2.  Pt will be indep to verbalize and implement 2-3 joint protection/cumulative trauma prevention strategies to reduce pain in the R hand.  Baseline: Eval: Education not yet initiated; 09/16/23: Pt is using built up foam pen, allowing more frequent rest breaks with activity, and remembering to limit pinching with thumb, but rather pressing into objects with palm when able Goal status: achieved  LONG TERM GOALS: Target date: 09/13/23  Pt will increase MAM-20 score for  musculoskeletal conditions by 9 or more points to indicate improvement in self perceived functional use of the R hand for daily tasks.   Baseline: Eval: 67/80; 09/16/23: same as eval Goal status: in progress  2.  Pt will increase R grip strength by 10 or more lbs to improve ability to hold and carry heavy ADL supplies. Baseline: Eval: R 35 lbs (L 45 lbs); 09/16/23: 40 lbs (L 45 lbs) Goal status: in progress  3.  Pt will increase R lateral pinch strength by 5 or more lbs to ease ability to open drink bottles. Baseline: Eval: R 11 lbs (L 14 lbs); 09/16/23: R 12 lbs (L 15 lbs), and using dycem which is also helping to open twist top containers Goal status: in progress  4.  Pt  will tolerate manual therapy, therapeutic modalities, and exercises to decrease pain in R hand to a reported 0/10 pain at rest and 3/10 pain or      less with activity.  Baseline: Eval: 4/10 pain at rest, 6-7/10 pain with activity; 09/16/23: 1/10 pain at rest, up to 5/10 with activity  Goal status: in progress   ASSESSMENT:  CLINICAL IMPRESSION: 9th iontophoresis tx completed with good tolerance.  Light blistering at patch site following tx; will continue to monitor.  Reduced resistance with isometric strengthening to R thumb after 1st set d/t R thumb CMC clicking/popping, though pt reported no pain.  Clicking mostly stopped with less resistance and better stability to ensure no joint movement during each contraction/hold.  Pt reports noticing that she was able to lay her R hand more flat today, noting improved thumb flexibility on the R, with manual therapy having targeted elongation of thenar eminence musculature.  Pt will continue to benefit from skilled OT to address above noted goals, with pt's primary goals being to increase strength and decrease pain in her R dominant hand in order to improve indep and tolerance to daily tasks.  Pt in agreement with OT poc.  PERFORMANCE DEFICITS: in functional skills including ADLs, IADLs,  strength, pain, fascial restrictions, body mechanics, decreased knowledge of precautions, decreased knowledge of use of DME, and UE functional use, and psychosocial skills including coping strategies, environmental adaptation, habits, and routines and behaviors.   IMPAIRMENTS: are limiting patient from ADLs, IADLs, rest and sleep, work, leisure, and social participation.   COMORBIDITIES: has co-morbidities such as OA, R rotator cuff tear/repair, asthma that affects occupational performance. Patient will benefit from skilled OT to address above impairments and improve overall function.  MODIFICATION OR ASSISTANCE TO COMPLETE EVALUATION: No modification of tasks or assist necessary to complete an evaluation.  OT OCCUPATIONAL PROFILE AND HISTORY: Detailed assessment: Review of records and additional review of physical, cognitive, psychosocial history related to current functional performance.  CLINICAL DECISION MAKING: Moderate - several treatment options, min-mod task modification necessary  REHAB POTENTIAL: Good  EVALUATION COMPLEXITY: Moderate      PLAN:  OT FREQUENCY: 2x/week  OT DURATION: 6 weeks  PLANNED INTERVENTIONS: 97168 OT Re-evaluation, 97535 self care/ADL training, 02889 therapeutic exercise, 97530 therapeutic activity, 97140 manual therapy, 97035 ultrasound, 97018 paraffin, 02989 moist heat, 97010 cryotherapy, 97034 contrast bath, 97033 iontophoresis, 97760 Orthotic Initial, 97763 Orthotic/Prosthetic subsequent, passive range of motion, psychosocial skills training, energy conservation, coping strategies training, patient/family education, and DME and/or AE instructions  RECOMMENDED OTHER SERVICES: None at this time  CONSULTED AND AGREED WITH PLAN OF CARE: Patient  PLAN FOR NEXT SESSION: iontophoresis, HEP progression, manual therapy  Inocente Blazing, MS, OTR/L  Inocente MARLA Blazing, OT 09/23/2023, 12:00 PM

## 2023-09-26 ENCOUNTER — Ambulatory Visit

## 2023-10-02 ENCOUNTER — Encounter: Payer: Self-pay | Admitting: Family Medicine

## 2023-10-08 ENCOUNTER — Encounter: Payer: Self-pay | Admitting: Radiology

## 2023-10-09 ENCOUNTER — Encounter (HOSPITAL_BASED_OUTPATIENT_CLINIC_OR_DEPARTMENT_OTHER): Payer: Self-pay | Admitting: Orthopaedic Surgery

## 2023-10-10 ENCOUNTER — Ambulatory Visit (INDEPENDENT_AMBULATORY_CARE_PROVIDER_SITE_OTHER): Admitting: Pulmonary Disease

## 2023-10-10 ENCOUNTER — Encounter: Payer: Self-pay | Admitting: Pulmonary Disease

## 2023-10-10 VITALS — BP 118/80 | HR 66 | Temp 97.5°F | Ht 59.0 in | Wt 165.0 lb

## 2023-10-10 DIAGNOSIS — J4540 Moderate persistent asthma, uncomplicated: Secondary | ICD-10-CM | POA: Diagnosis not present

## 2023-10-10 NOTE — Progress Notes (Signed)
 Synopsis: Referred in by Shelby Mire, MD   Subjective:   PATIENT ID: Shelby Henson GENDER: female DOB: 12-Dec-1970, MRN: [id number]  Chief Complaint  Patient presents with   Asthma    Breathing is good. No SOB, wheezing or cough.    HPI Shelby Henson is a 53 y.o female patient with a past medical history of moderate persistent asthma and GERD presenting to the pulmonary clinic today for further management of her asthma.   She had asthma as a child which she grew out of and then symptoms restarted in her early 24s. She was on pulmicort  nebulizer and albuterol  as needed. She never required hospitalization and symptoms manifest as dry cough usually.   I saw her in August 2024 for complaints of 4 months of dry cough that persisted. She was trialed on airsupra  with minimal benefit. About a week prior to her presentation, she was started on Trelegy Ellipta  100 1 puff daily which seems to be helping. I increased her Trelegy Ellipta  to 200 which seems to have helped with cough. She denies any rash, neuropathy, allergic rhinitis or cardiac symptoms.   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 No travel outside the [place] . She has 3 kids who are healthy.   OV 07/16/2023 - Shelby. Henson is here to follow up regarding her asthma. She is feeling overall well. She is still recovering from an asthma flare about a month ago. She is not using her trelegy regulary but only intermittently. I clearly explained to her that she should use it every day regardless of how she is feeling. Unfortunately she has not done her PFTs yet.   OV 10/10/2023 - Shelby. Henson is here to follow up on her moderate to severe persistent asthma. She reports feeling well and has no complaints. She has not used her rescue inhaler in the last 4 weeks. She scored 24 on her ACT today in clinic. We discussed that we should continue on the following regimen and I will see her back  in 6 months at which point we can consider tapering off her inhaler.   ROS All systems were reviewed and are negative except for the above.  Objective:   Vitals:   10/10/23 0833  BP: 118/80  Pulse: 66  Temp: (!) 97.5 F (36.4 C)  SpO2: 96%  Weight: 165 lb (74.8 kg)  Height: 4' 11 (1.499 m)   96% on RA BMI Readings from Last 3 Encounters:  10/10/23 33.33 kg/m  07/16/23 35.75 kg/m  06/19/23 35.04 kg/m   Wt Readings from Last 3 Encounters:  10/10/23 165 lb (74.8 kg)  07/16/23 177 lb (80.3 kg)  06/19/23 173 lb 8 oz (78.7 kg)    Physical Exam GEN: NAD, Healthy Appearing HEENT: Supple Neck, Reactive Pupils, EOMI  CVS: Normal S1, Normal S2, RRR, No murmurs or ES appreciated  Lungs: Clear bilateral air entry.  Abdomen: Soft, non tender, non distended, + BS  Extremities: Warm and well perfused, No edema  Skin: No suspicious lesions appreciated  Psych: Normal Affect  Ancillary Information   CBC    Component Value Date/Time   WBC 8.4 03/29/2023 1027   RBC 4.03 03/29/2023 1027   HGB 11.1 (L) 03/29/2023 1027   HGB 12.2 08/04/2018 1557   HCT 34.0 (L) 03/29/2023 1027   HCT 36.3 08/04/2018 1557   PLT 252 03/29/2023 1027   PLT 208 08/04/2018 1557   MCV  84.4 03/29/2023 1027   MCV 82 08/04/2018 1557   MCV 86 08/07/2011 2024   MCH 27.5 03/29/2023 1027   MCHC 32.6 03/29/2023 1027   RDW 15.0 03/29/2023 1027   RDW 13.6 08/04/2018 1557   RDW 13.8 08/07/2011 2024   LYMPHSABS 2,019 08/10/2022 1150   LYMPHSABS 2.1 05/20/2015 0824   LYMPHSABS 2.6 08/07/2011 2024   MONOABS 0.5 12/08/2019 1633   MONOABS 0.6 08/07/2011 2024   EOSABS 1,268 (H) 03/29/2023 1027   EOSABS 1.1 (H) 05/20/2015 0824   EOSABS 1.0 (H) 08/07/2011 2024   BASOSABS 59 03/29/2023 1027   BASOSABS 0.0 05/20/2015 0824   BASOSABS 0.1 08/07/2011 2024        No data to display         Chest x-ray 08/06 no acute intrathoracic process.  Using over-the-counter in July 2024 897 Allergen panel was  negative.  IgE 4.  Creatinine 0.7 and BUN 15. Strongyloides serology was negative.  Assessment & Plan:  Shelby Gunner is a 53 y.o female patient with a past medical history of moderate persistent asthma and GERD presenting to the pulmonary clinic today for further management of her asthma.   #Moderate Persistent Asthma   Manifests as dry cough. Elevated EOS at 1100. No other clear organ involvement.  -- FENO 12 indeterminant for intrapulmonary eosinophilic inflammation.  -- Allergen panel negative   []  Continue with Fluticasone -Umeclidinum-Vilanterol [Trelegy Ellipta ] to 233mcg-62.5mcg-25mcg 1 puff daily.  []  Continue with budesonide -albuterol  [Airsupra ] 80-92 puffs every 6 hours as needed.  #GERD Currently on famotidine  40mg  1tab daily.   []  c/w Pantoprazole  40mg  1 tab PO daily  []  c/w Famotidine  to 20mg  1tab daily  []  Advise on lifestyle modifications.   RTC 6 months.   I spent 20 minutes caring for this patient today, including preparing to see the patient, obtaining a medical history , reviewing a separately obtained history, performing a medically appropriate examination and/or evaluation, counseling and educating the patient/family/caregiver, documenting clinical information in the electronic health record, and independently interpreting results (not separately reported/billed) and communicating results to the patient/family/caregiver  Darrin Barn, MD [facility] 10/10/2023 8:47 AM

## 2023-10-11 ENCOUNTER — Ambulatory Visit (HOSPITAL_BASED_OUTPATIENT_CLINIC_OR_DEPARTMENT_OTHER): Admitting: Orthopaedic Surgery

## 2023-10-11 DIAGNOSIS — M75111 Incomplete rotator cuff tear or rupture of right shoulder, not specified as traumatic: Secondary | ICD-10-CM | POA: Diagnosis not present

## 2023-10-11 NOTE — Progress Notes (Signed)
 Post Operative Evaluation     Procedure/Date of Surgery: Right rotator cuff repair with collagen patch augmentation 12/5   Interval History:  . Presents the above procedure.  At today's visit she is having predominantly a trigger point in the posterior aspect of the shoulder.  Does get some tenderness about the lateral aspect of the shoulder with overhead flexion  PMH/PSH/Family History/Social History/Meds/Allergies:    Past Medical History:  Diagnosis Date   Anemia    Asthma    well controlled   Colon polyps    Complication of anesthesia    DDD (degenerative disc disease), lumbar    Depression    GERD (gastroesophageal reflux disease)    occ   Iron  deficiency anemia    Migraine    Morbid obesity (HCC)    Osteoarthritis    Osteoarthritis of right hip    PONV (postoperative nausea and vomiting)    with c-section x 1   Right rotator cuff tear 11/2022   Vitamin D  deficiency    Past Surgical History:  Procedure Laterality Date   BREAST CYST ASPIRATION Left    CESAREAN SECTION  1990   CESAREAN SECTION  1993   CESAREAN SECTION  1996   COLONOSCOPY N/A 09/22/2020   Procedure: COLONOSCOPY;  Surgeon: Janalyn Keene NOVAK, MD;  Location: [facility];  Service: Endoscopy;  Laterality: N/A;   LAPAROSCOPY  2018   LEEP  2005   benign pathology; Dr. Rolm   SHOULDER ACROMIOPLASTY Right 01/03/2023   Procedure: RIGHT SHOULDER ACROMIOPLASTY;  Surgeon: Genelle Standing, MD;  Location: [facility];  Service: Orthopedics;  Laterality: Right;   SHOULDER ARTHROSCOPY WITH ROTATOR CUFF REPAIR AND OPEN BICEPS TENODESIS Right 01/03/2023   Procedure: RIGHT SHOULDER ARTHROSCOPY WITH ROTATOR CUFF REPAIR AND  BICEPS TENODESIS;  Surgeon: Genelle Standing, MD;  Location: [facility];  Service: Orthopedics;  Laterality: Right;   SUPRACERVICAL ABDOMINAL HYSTERECTOMY  2010   leio/AUB; Dr. Rolm   TOTAL HIP ARTHROPLASTY Right 11/12/2018   Procedure: TOTAL HIP  ARTHROPLASTY ANTERIOR APPROACH;  Surgeon: Leora Lynwood SAUNDERS, MD;  Location: [facility];  Service: Orthopedics;  Laterality: Right;   Social History   Socioeconomic History   Marital status: Divorced    Spouse name: Not on file   Number of children: 3   Years of education: Not on file   Highest education level: Associate degree: academic program  Occupational History   Not on file  Tobacco Use   Smoking status: Never   Smokeless tobacco: Never  Vaping Use   Vaping status: Never Used  Substance and Sexual Activity   Alcohol use: Yes    Alcohol/week: 0.0 standard drinks of alcohol    Comment: occasionally   Drug use: No   Sexual activity: Yes    Birth control/protection: Surgical    Comment: Hysterectomy  Other Topics Concern   Not on file  Social History Narrative   Not on file   Social Drivers of Health   Financial Resource Strain: Medium Risk (03/26/2023)   Overall Financial Resource Strain (CARDIA)    Difficulty of Paying Living Expenses: Somewhat hard  Food Insecurity: Patient Declined (03/26/2023)   Hunger Vital Sign    Worried About Running Out of Food in the Last Year: Patient declined    Ran Out of Food in the Last Year: Patient  declined  Transportation Needs: No Transportation Needs (03/26/2023)   PRAPARE - Administrator, Civil Service (Medical): No    Lack of Transportation (Non-Medical): No  Physical Activity: Unknown (03/26/2023)   Exercise Vital Sign    Days of Exercise per Week: 0 days    Minutes of Exercise per Session: Not on file  Recent Concern: Physical Activity - Inactive (03/26/2023)   Exercise Vital Sign    Days of Exercise per Week: 0 days    Minutes of Exercise per Session: 90 min  Stress: Stress Concern Present (03/26/2023)   [employer] of Occupational Health - Occupational Stress Questionnaire    Feeling of Stress : To some extent  Social Connections: Socially Isolated (03/26/2023)   Social Connection and Isolation Panel     Frequency of Communication with Friends and Family: More than three times a week    Frequency of Social Gatherings with Friends and Family: Three times a week    Attends Religious Services: Never    Active Member of Clubs or Organizations: No    Attends Engineer, structural: Not on file    Marital Status: Divorced   Family History  Problem Relation Age of Onset   Depression Mother    Bipolar disorder Brother    Colon cancer Maternal Grandfather        not sure of age   Breast cancer Neg Hx    Allergies  Allergen Reactions   Celecoxib Hives   Prednisone      intolerance, makes her angry. Makes her angry   Topamax  [Topiramate ]     Mood changes   Zolpidem     Mood changes   Codeine Rash   Penicillins Rash    Did it involve swelling of the face/tongue/throat, SOB, or low BP? No Did it involve sudden or severe rash/hives, skin peeling, or any reaction on the inside of your mouth or nose? Yes Did you need to seek medical attention at a hospital or doctor's office? Yes When did it last happen? More than 20 years ago       If all above answers are "NO", may proceed with cephalosporin use.    Current Outpatient Medications  Medication Sig Dispense Refill   albuterol  (PROVENTIL ) (2.5 MG/3ML) 0.083% nebulizer solution Take 3 mLs (2.5 mg total) by nebulization every 6 (six) hours as needed for wheezing or shortness of breath 75 mL 0   Albuterol -Budesonide  (AIRSUPRA ) 90-80 MCG/ACT AERO Inhale 1-2 puffs into the lungs as needed (wheezing). 10.7 g 5   chlorpheniramine-HYDROcodone  (TUSSIONEX) 10-8 MG/5ML Take 5 mLs by mouth every 12 (twelve) hours as needed for cough (will cause drowsiness.). 115 mL 0   desvenlafaxine  (PRISTIQ ) 50 MG 24 hr tablet Take 1 tablet (50 mg total) by mouth daily. 90 tablet 1   diclofenac  Sodium (VOLTAREN ) 1 % GEL Apply 2 grams topically 4 (four) times daily. 100 g 1   famotidine  (PEPCID ) 40 MG tablet Take 1 tablet (40 mg total) by mouth daily. 90 tablet 1    Fluticasone -Umeclidin-Vilant (TRELEGY ELLIPTA ) 200-62.5-25 MCG/ACT AEPB Inhale 1 puff into the lungs daily. (Patient taking differently: Inhale 1 puff into the lungs at bedtime.) 60 each 6   Fluticasone -Umeclidin-Vilant (TRELEGY ELLIPTA ) 200-62.5-25 MCG/ACT AEPB Inhale 1 puff into the lungs daily. 180 each 6   gabapentin  (NEURONTIN ) 300 MG capsule Take 3 capsules (900 mg total) by mouth at bedtime. 270 capsule 1   Galcanezumab -gnlm (EMGALITY ) 120 MG/ML SOAJ Inject 1 mL into the skin every  30 (thirty) days. 1 mL 5   lidocaine  (LIDODERM ) 5 % Place 2 patches onto the skin daily. Remove & Discard patch within 12 hours or as directed by MD 60 patch 2   pantoprazole  (PROTONIX ) 40 MG tablet Take 1 tablet (40 mg total) by mouth daily. (Patient taking differently: Take 40 mg by mouth daily as needed.) 30 tablet 1   prazosin  (MINIPRESS ) 2 MG capsule Take 1 capsule (2 mg total) by mouth at bedtime. 90 capsule 0   No current facility-administered medications for this visit.   No results found.   Review of Systems:   A ROS was performed including pertinent positives and negatives as documented in the HPI.   Musculoskeletal Exam:    There were no vitals taken for this visit.  Right shoulder incisions are well-appearing without erythema or drainage.  Passive range of motion as well as active range of motion is to 135 degrees in the standing position against gravity.  External rotation at side is 30 degrees 2+ radial pulse.  Distal neurosensory exam is intact.  Sling is well-fitting after adjustment  Right hip with positive Trendelenburg gait and pain over the trochanter with pain with resisted abduction of the right leg.  There is 3 degrees internal/external rotation without pain.  Distal neurosensory exam is intact  Imaging:      I personally reviewed and interpreted the radiographs.   Assessment:   Status post right shoulder rotator cuff repair with collagen patch augmentation she is still  having pain at this time for which I recommended a follow-up MRI to assess for the integrity and healing of the repair.  We did also counsel her on some techniques for release of a posterior trigger point.  -Return to clinic 1 week for discussion of MRI hip and shoulder   I personally saw and evaluated the patient, and participated in the management and treatment plan.  Elspeth Parker, MD Attending Physician, Orthopedic Surgery  This document was dictated using Dragon voice recognition software. A reasonable attempt at proof reading has been made to minimize errors.

## 2023-10-14 ENCOUNTER — Telehealth: Payer: Self-pay | Admitting: Orthopaedic Surgery

## 2023-10-14 NOTE — Telephone Encounter (Signed)
 Patient called and said that Dr. B said for her to overbook if needed but he needs to see her Friday after 230pm. CB#[phone]

## 2023-10-18 ENCOUNTER — Other Ambulatory Visit

## 2023-10-18 ENCOUNTER — Other Ambulatory Visit: Payer: Self-pay | Admitting: Family Medicine

## 2023-10-18 ENCOUNTER — Ambulatory Visit: Admitting: Family Medicine

## 2023-10-18 ENCOUNTER — Other Ambulatory Visit: Payer: Self-pay

## 2023-10-18 DIAGNOSIS — G43009 Migraine without aura, not intractable, without status migrainosus: Secondary | ICD-10-CM

## 2023-10-21 ENCOUNTER — Encounter (HOSPITAL_BASED_OUTPATIENT_CLINIC_OR_DEPARTMENT_OTHER): Payer: Self-pay | Admitting: Orthopaedic Surgery

## 2023-10-23 ENCOUNTER — Other Ambulatory Visit: Payer: Self-pay

## 2023-10-23 ENCOUNTER — Ambulatory Visit: Admitting: Family Medicine

## 2023-10-23 ENCOUNTER — Encounter: Payer: Self-pay | Admitting: Family Medicine

## 2023-10-23 VITALS — BP 122/74 | HR 80 | Resp 16 | Ht 59.0 in | Wt 176.5 lb

## 2023-10-23 DIAGNOSIS — G8928 Other chronic postprocedural pain: Secondary | ICD-10-CM

## 2023-10-23 DIAGNOSIS — K219 Gastro-esophageal reflux disease without esophagitis: Secondary | ICD-10-CM | POA: Diagnosis not present

## 2023-10-23 DIAGNOSIS — J4550 Severe persistent asthma, uncomplicated: Secondary | ICD-10-CM

## 2023-10-23 DIAGNOSIS — F5104 Psychophysiologic insomnia: Secondary | ICD-10-CM

## 2023-10-23 DIAGNOSIS — E8881 Metabolic syndrome: Secondary | ICD-10-CM | POA: Diagnosis not present

## 2023-10-23 DIAGNOSIS — F39 Unspecified mood [affective] disorder: Secondary | ICD-10-CM | POA: Diagnosis not present

## 2023-10-23 DIAGNOSIS — E538 Deficiency of other specified B group vitamins: Secondary | ICD-10-CM | POA: Diagnosis not present

## 2023-10-23 DIAGNOSIS — E785 Hyperlipidemia, unspecified: Secondary | ICD-10-CM

## 2023-10-23 DIAGNOSIS — E559 Vitamin D deficiency, unspecified: Secondary | ICD-10-CM

## 2023-10-23 DIAGNOSIS — G43009 Migraine without aura, not intractable, without status migrainosus: Secondary | ICD-10-CM | POA: Diagnosis not present

## 2023-10-23 DIAGNOSIS — D649 Anemia, unspecified: Secondary | ICD-10-CM | POA: Diagnosis not present

## 2023-10-23 MED ORDER — FAMOTIDINE 40 MG PO TABS
40.0000 mg | ORAL_TABLET | Freq: Every day | ORAL | 1 refills | Status: AC
  Filled 2023-10-23 – 2023-11-28 (×3): qty 90, 90d supply, fill #0
  Filled 2024-03-02: qty 90, 90d supply, fill #1

## 2023-10-23 MED ORDER — DESVENLAFAXINE SUCCINATE ER 50 MG PO TB24
50.0000 mg | ORAL_TABLET | Freq: Every day | ORAL | 1 refills | Status: AC
  Filled 2023-10-23 – 2023-11-28 (×3): qty 90, 90d supply, fill #0
  Filled 2024-03-02: qty 90, 90d supply, fill #1

## 2023-10-23 MED ORDER — EMGALITY 120 MG/ML ~~LOC~~ SOAJ
1.0000 mL | SUBCUTANEOUS | 5 refills | Status: AC
  Filled 2023-10-23: qty 1, 28d supply, fill #0
  Filled 2023-11-29: qty 1, 28d supply, fill #1
  Filled 2024-01-02: qty 1, 28d supply, fill #2
  Filled 2024-02-10: qty 1, 30d supply, fill #3

## 2023-10-23 MED ORDER — PRAZOSIN HCL 2 MG PO CAPS
2.0000 mg | ORAL_CAPSULE | Freq: Every day | ORAL | 1 refills | Status: AC
  Filled 2023-10-23: qty 90, 90d supply, fill #0
  Filled 2024-01-27: qty 90, 90d supply, fill #1

## 2023-10-23 NOTE — Progress Notes (Signed)
 Name: JALEYA PEBLEY   MRN: [id number]    DOB: 27-May-1970   Date:10/23/2023       Progress Note  Subjective  Chief Complaint  Chief Complaint  Patient presents with   Medical Management of Chronic Issues   Discussed the use of AI scribe software for clinical note transcription with the patient, who gave verbal consent to proceed.  History of Present Illness DYLLAN KATS is a 53 year old female who presents for a regular follow-up.  She experiences chronic right hip pain following a hip replacement, with pain primarily located on the outer hip and thigh, averaging 6 out of 10 in intensity. She continues to take gabapentin  for pain management and has had to change her job to accommodate her pain, requiring the ability to sit down. An MRI of the right hip is scheduled.  She underwent shoulder acromioplasty and rotator cuff repair in December 2024. She describes her shoulder pain as 'terrible'. She has received injections for pain relief; the first was effective, but the second was not. Her job primarily involves typing, which she can manage despite the pain.  She has GERD  and previously alternated between a PPI and an H2 blocker. Currently, she takes pantoprazole  as needed and famotidine  40 mg in the morning, which controls her heartburn and indigestion most of the time.  She has metabolic syndrome and obesity with a BMI of 35.65. She started a keto diet two months ago, resulting in a slight weight reduction from 178 to 176 pounds. She previously tried Ozempic  and Wegovy  for weight loss, but insurance did not cover them. She is mindful of her diet, focusing on protein and vegetables.  She experiences insomnia and has tried various treatments, including melatonin, temazepam, and trazodone , which made her moody. Prazosin  helps her feel sleepy but does not improve sleep quality. She feels tired all the time and lacks energy. ESS today of 4 and denies snoring, discussed possible evaluation by sleep  medicine  She has severe persistent asthma and uses Trelegy at night, which works well. She does not require rescue inhalers and has no cough, wheezing, or shortness of breath.  She takes Pristiq  50 mg for mood stabilization, which has helped level off her mood swings. No current depression.  She experiences migraines and uses Emgality , which reduces the frequency of her migraines. Without the medication, migraines start in the third week of the month. She has been out of the medication and reports the return of migraines this week.  She has a history of B12 deficiency and receives injections, although she does not feel a difference with them. She is not currently taking vitamin D  supplements.    Patient Active Problem List   Diagnosis Date Noted   Severe persistent asthma without complication 03/29/2023   Nontraumatic incomplete tear of right rotator cuff 01/03/2023   Right rotator cuff tendonitis 09/11/2022   Iron  deficiency anemia due to dietary causes 08/10/2022   Chronic right shoulder pain 05/11/2022   Morbid obesity (HCC) 01/05/2022   Rotator cuff impingement syndrome, right 09/29/2021   Biceps tendinitis, right 09/29/2021   Meralgia paresthetica of right side 03/29/2021   Psoas tendinitis, right hip 03/29/2021   History of total right hip arthroplasty 03/29/2021   Status post total replacement of right hip 02/19/2021   Meralgia paresthetica of right side 02/19/2021   Polyp of ascending colon 02/19/2021   Polyp of sigmoid colon 02/19/2021   Special screening for malignant neoplasms, colon    Polyp  of ascending colon    Polyp of sigmoid colon    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 03/23/2020   Pain in right hip 01/12/2019   Vitamin D  deficiency 02/25/2018   Chronic pain 10/24/2015   Hot flashes 10/24/2015   Migraine without aura and without status migrainosus, not intractable 10/24/2015   Metabolic syndrome 10/24/2015   Dyslipidemia 10/24/2015    Allergic rhinitis, seasonal 05/17/2015   Chronic insomnia 08/08/2014   DDD (degenerative disc disease), lumbar 08/08/2014   Mood disorder 08/08/2014   Gastroesophageal reflux disease without esophagitis 08/08/2014   H/O total hysterectomy 08/08/2014   Irritable bowel syndrome with diarrhea 08/08/2014   Osteoarthritis of right hip 08/08/2014   Adult BMI 30+ 08/08/2014   SBO (spina bifida occulta) 08/08/2014    Past Surgical History:  Procedure Laterality Date   BREAST CYST ASPIRATION Left    CESAREAN SECTION  1990   CESAREAN SECTION  1993   CESAREAN SECTION  1996   COLONOSCOPY N/A 09/22/2020   Procedure: COLONOSCOPY;  Surgeon: Janalyn Keene NOVAK, MD;  Location: [facility];  Service: Endoscopy;  Laterality: N/A;   LAPAROSCOPY  2018   LEEP  2005   benign pathology; Dr. Rolm   SHOULDER ACROMIOPLASTY Right 01/03/2023   Procedure: RIGHT SHOULDER ACROMIOPLASTY;  Surgeon: Genelle Standing, MD;  Location: [facility];  Service: Orthopedics;  Laterality: Right;   SHOULDER ARTHROSCOPY WITH ROTATOR CUFF REPAIR AND OPEN BICEPS TENODESIS Right 01/03/2023   Procedure: RIGHT SHOULDER ARTHROSCOPY WITH ROTATOR CUFF REPAIR AND  BICEPS TENODESIS;  Surgeon: Genelle Standing, MD;  Location: [facility];  Service: Orthopedics;  Laterality: Right;   SUPRACERVICAL ABDOMINAL HYSTERECTOMY  2010   leio/AUB; Dr. Rolm   TOTAL HIP ARTHROPLASTY Right 11/12/2018   Procedure: TOTAL HIP ARTHROPLASTY ANTERIOR APPROACH;  Surgeon: Leora Lynwood SAUNDERS, MD;  Location: [facility];  Service: Orthopedics;  Laterality: Right;    Family History  Problem Relation Age of Onset   Depression Mother    Bipolar disorder Brother    Colon cancer Maternal Grandfather        not sure of age   Breast cancer Neg Hx     Social History   Tobacco Use   Smoking status: Never   Smokeless tobacco: Never  Substance Use Topics   Alcohol use: Yes    Alcohol/week: 0.0 standard drinks of alcohol    Comment: occasionally      Current Outpatient Medications:    albuterol  (PROVENTIL ) (2.5 MG/3ML) 0.083% nebulizer solution, Take 3 mLs (2.5 mg total) by nebulization every 6 (six) hours as needed for wheezing or shortness of breath, Disp: 75 mL, Rfl: 0   Albuterol -Budesonide  (AIRSUPRA ) 90-80 MCG/ACT AERO, Inhale 1-2 puffs into the lungs as needed (wheezing)., Disp: 10.7 g, Rfl: 5   chlorpheniramine-HYDROcodone  (TUSSIONEX) 10-8 MG/5ML, Take 5 mLs by mouth every 12 (twelve) hours as needed for cough (will cause drowsiness.)., Disp: 115 mL, Rfl: 0   desvenlafaxine  (PRISTIQ ) 50 MG 24 hr tablet, Take 1 tablet (50 mg total) by mouth daily., Disp: 90 tablet, Rfl: 1   diclofenac  Sodium (VOLTAREN ) 1 % GEL, Apply 2 grams topically 4 (four) times daily., Disp: 100 g, Rfl: 1   famotidine  (PEPCID ) 40 MG tablet, Take 1 tablet (40 mg total) by mouth daily., Disp: 90 tablet, Rfl: 1   Fluticasone -Umeclidin-Vilant (TRELEGY ELLIPTA ) 200-62.5-25 MCG/ACT AEPB, Inhale 1 puff into the lungs daily. (Patient taking differently: Inhale 1 puff into the lungs at bedtime.), Disp: 60 each, Rfl: 6  Fluticasone -Umeclidin-Vilant (TRELEGY ELLIPTA ) 200-62.5-25 MCG/ACT AEPB, Inhale 1 puff into the lungs daily., Disp: 180 each, Rfl: 6   gabapentin  (NEURONTIN ) 300 MG capsule, Take 3 capsules (900 mg total) by mouth at bedtime., Disp: 270 capsule, Rfl: 1   Galcanezumab -gnlm (EMGALITY ) 120 MG/ML SOAJ, Inject 1 mL into the skin every 30 (thirty) days., Disp: 1 mL, Rfl: 5   lidocaine  (LIDODERM ) 5 %, Place 2 patches onto the skin daily. Remove & Discard patch within 12 hours or as directed by MD, Disp: 60 patch, Rfl: 2   pantoprazole  (PROTONIX ) 40 MG tablet, Take 1 tablet (40 mg total) by mouth daily. (Patient taking differently: Take 40 mg by mouth daily as needed.), Disp: 30 tablet, Rfl: 1   prazosin  (MINIPRESS ) 2 MG capsule, Take 1 capsule (2 mg total) by mouth at bedtime., Disp: 90 capsule, Rfl: 0  Allergies  Allergen Reactions   Celecoxib Hives    Prednisone      intolerance, makes her angry. Makes her angry   Topamax  [Topiramate ]     Mood changes   Zolpidem     Mood changes   Codeine Rash   Penicillins Rash    Did it involve swelling of the face/tongue/throat, SOB, or low BP? No Did it involve sudden or severe rash/hives, skin peeling, or any reaction on the inside of your mouth or nose? Yes Did you need to seek medical attention at a hospital or doctor's office? Yes When did it last happen? More than 20 years ago       If all above answers are "NO", may proceed with cephalosporin use.     I personally reviewed active problem list, medication list, allergies with the patient/caregiver today.   ROS  Ten systems reviewed and is negative except as mentioned in HPI    Objective Physical Exam MEASUREMENTS: Weight- 176, BMI- 35.65. CONSTITUTIONAL: Patient appears well-developed and well-nourished. No distress. HEENT: Head atraumatic, normocephalic, neck supple. CARDIOVASCULAR: Normal rate, regular rhythm and normal heart sounds. No murmur heard. No edema in extremities. PULMONARY: Effort normal and breath sounds normal. Lungs clear to auscultation bilaterally. No respiratory distress. ABDOMINAL: There is no tenderness or distention. MUSCULOSKELETAL: antalgic gait PSYCHIATRIC: Patient has a normal mood and affect. Behavior is normal. Judgment and thought content normal.  Vitals:   10/23/23 1038  BP: 122/74  Pulse: 80  Resp: 16  SpO2: 97%  Weight: 176 lb 8 oz (80.1 kg)  Height: 4' 11 (1.499 m)    Body mass index is 35.65 kg/m.  PHQ2/9:    10/23/2023   10:37 AM 06/19/2023   10:46 AM 03/29/2023    9:36 AM 03/05/2023    2:03 PM 12/14/2022    9:50 AM  Depression screen PHQ 2/9  Decreased Interest 0 0 0 0 0  Down, Depressed, Hopeless 0 0 0 0 0  PHQ - 2 Score 0 0 0 0 0  Altered sleeping  0 0  0  Tired, decreased energy  0 0  0  Change in appetite  0 0  0  Feeling bad or failure about yourself   0 0  0  Trouble  concentrating  0 0  0  Moving slowly or fidgety/restless  0 0  0  Suicidal thoughts  0 0  0  PHQ-9 Score  0 0  0  Difficult doing work/chores  Not difficult at all Not difficult at all      phq 9 is negative  Fall Risk:    10/23/2023   10:37  AM 06/19/2023   10:46 AM 03/05/2023    2:03 PM 12/14/2022    9:50 AM 09/11/2022    9:00 AM  Fall Risk   Falls in the past year? 0 1 0 1 1  Number falls in past yr: 0 0  0 0  Injury with Fall? 0 0  1 1  Comment     bruising on right side, right shoulder pain  Risk for fall due to : No Fall Risks   History of fall(s);Orthopedic patient   Follow up Falls evaluation completed Falls evaluation completed  Falls prevention discussed     Assessment & Plan Chronic right hip pain after hip replacement Persistent pain in right hip and thigh, pain level 6/10, despite hip replacement and gabapentin . MRI ordered for further investigation. - Continue gabapentin .  Chronic right shoulder pain after rotator cuff repair and acromioplasty Severe pain persists post-surgery. Adequate range of motion. Previous injections provided temporary relief. MRI ordered. - Continue follow-up with orthopedic specialist.  Severe persistent asthma Asthma well-controlled with Trelegy. No rescue inhalers needed. Pulmonologist management adequate. - Continue Trelegy as prescribed. - Cancel old Trelegy prescription from June.  Obesity with metabolic syndrome BMI 35.65. Minimal weight change on keto diet. Previous weight loss medications ineffective or not covered. Not interested in other medications. - Continue keto diet. - Monitor weight and BMI.  Prediabetes Previous A1c 5.7. Monitoring necessary for progression. - Order A1c test.  Chronic insomnia Persistent insomnia despite medication trials. Prazosin  provides some sleepiness. No sleep study conducted. - Consider referral to sleep specialist in [place].  Fatigue Chronic fatigue persists. Potential factors include  insomnia, anemia, vitamin deficiencies. Further evaluation needed. - Order CBC, iron  panel, B12, and vitamin D  levels.  Migraine, on preventive therapy Emgality  effective in reducing frequency. Migraines return in third week. Prefers current regimen. - Prescribe Emgality  with six-month refill.  Vitamin B12 deficiency with anemia B12 deficiency with anemia. Previous injections given. Current B12 status unknown. - Order CBC, iron  panel, and B12 level. - Hold B12 injection until lab results are available.  Vitamin D  deficiency Vitamin D  deficiency. Not taking supplements. - Encourage vitamin D  supplementation. - Order vitamin D  level.  Gastroesophageal reflux disease (GERD) GERD managed with famotidine  40 mg daily. Pantoprazole  not regularly needed. - Prescribe famotidine  40 mg daily.  Mood disorder, unspecified (on Pristiq ) Mood swings stabilized with Pristiq  50 mg. No formal depression diagnosis. Medication effective. - Continue Pristiq  50 mg daily. - Refill prescription.

## 2023-10-24 ENCOUNTER — Ambulatory Visit: Payer: Self-pay | Admitting: Family Medicine

## 2023-10-24 ENCOUNTER — Encounter (HOSPITAL_BASED_OUTPATIENT_CLINIC_OR_DEPARTMENT_OTHER): Payer: Self-pay | Admitting: Orthopaedic Surgery

## 2023-10-24 LAB — COMPREHENSIVE METABOLIC PANEL WITH GFR
AG Ratio: 1.5 (calc) (ref 1.0–2.5)
ALT: 28 U/L (ref 6–29)
AST: 25 U/L (ref 10–35)
Albumin: 4.5 g/dL (ref 3.6–5.1)
Alkaline phosphatase (APISO): 118 U/L (ref 37–153)
BUN: 20 mg/dL (ref 7–25)
CO2: 28 mmol/L (ref 20–32)
Calcium: 9.7 mg/dL (ref 8.6–10.4)
Chloride: 105 mmol/L (ref 98–110)
Creat: 0.6 mg/dL (ref 0.50–1.03)
Globulin: 3 g/dL (ref 1.9–3.7)
Glucose, Bld: 84 mg/dL (ref 65–99)
Potassium: 4.1 mmol/L (ref 3.5–5.3)
Sodium: 141 mmol/L (ref 135–146)
Total Bilirubin: 0.3 mg/dL (ref 0.2–1.2)
Total Protein: 7.5 g/dL (ref 6.1–8.1)
eGFR: 107 mL/min/1.73m2 (ref >=60)

## 2023-10-24 LAB — CBC WITH DIFFERENTIAL/PLATELET
Absolute Lymphocytes: 2295 {cells}/uL (ref 850–3900)
Absolute Monocytes: 578 {cells}/uL (ref 200–950)
Basophils Absolute: 60 {cells}/uL (ref 0–200)
Basophils Relative: 0.8 %
Eosinophils Absolute: 653 {cells}/uL — ABNORMAL HIGH (ref 15–500)
Eosinophils Relative: 8.7 %
HCT: 37 % (ref 35.0–45.0)
Hemoglobin: 11.7 g/dL (ref 11.7–15.5)
MCH: 26.8 pg — ABNORMAL LOW (ref 27.0–33.0)
MCHC: 31.6 g/dL — ABNORMAL LOW (ref 32.0–36.0)
MCV: 84.9 fL (ref 80.0–100.0)
MPV: 12.1 fL (ref 7.5–12.5)
Monocytes Relative: 7.7 %
Neutro Abs: 3915 {cells}/uL (ref 1500–7800)
Neutrophils Relative %: 52.2 %
Platelets: 232 Thousand/uL (ref 140–400)
RBC: 4.36 Million/uL (ref 3.80–5.10)
RDW: 13.7 % (ref 11.0–15.0)
Total Lymphocyte: 30.6 %
WBC: 7.5 Thousand/uL (ref 3.8–10.8)

## 2023-10-24 LAB — IRON,TIBC AND FERRITIN PANEL
%SAT: 13 % — ABNORMAL LOW (ref 16–45)
Ferritin: 25 ng/mL (ref 16–232)
Iron: 49 ug/dL (ref 45–160)
TIBC: 385 ug/dL (ref 250–450)

## 2023-10-24 LAB — HEMOGLOBIN A1C
Hgb A1c MFr Bld: 5.6 % (ref <5.7)
Mean Plasma Glucose: 114 mg/dL
eAG (mmol/L): 6.3 mmol/L

## 2023-10-24 LAB — VITAMIN D 25 HYDROXY (VIT D DEFICIENCY, FRACTURES): Vit D, 25-Hydroxy: 28 ng/mL — ABNORMAL LOW (ref 30–100)

## 2023-10-24 LAB — B12 AND FOLATE PANEL
Folate: 19.8 ng/mL
Vitamin B-12: 395 pg/mL (ref 200–1100)

## 2023-10-25 ENCOUNTER — Ambulatory Visit (HOSPITAL_BASED_OUTPATIENT_CLINIC_OR_DEPARTMENT_OTHER): Admitting: Orthopaedic Surgery

## 2023-10-25 ENCOUNTER — Ambulatory Visit (HOSPITAL_BASED_OUTPATIENT_CLINIC_OR_DEPARTMENT_OTHER): Payer: Self-pay | Admitting: Orthopaedic Surgery

## 2023-10-25 ENCOUNTER — Ambulatory Visit
Admission: RE | Admit: 2023-10-25 | Discharge: 2023-10-25 | Disposition: A | Discharge status: Home / Self Care | Source: Ambulatory Visit | Attending: Orthopaedic Surgery | Admitting: Orthopaedic Surgery

## 2023-10-25 ENCOUNTER — Other Ambulatory Visit (HOSPITAL_BASED_OUTPATIENT_CLINIC_OR_DEPARTMENT_OTHER): Payer: Self-pay

## 2023-10-25 DIAGNOSIS — M67951 Unspecified disorder of synovium and tendon, right thigh: Secondary | ICD-10-CM

## 2023-10-25 DIAGNOSIS — M75111 Incomplete rotator cuff tear or rupture of right shoulder, not specified as traumatic: Secondary | ICD-10-CM

## 2023-10-25 DIAGNOSIS — M19011 Primary osteoarthritis, right shoulder: Secondary | ICD-10-CM | POA: Diagnosis not present

## 2023-10-25 DIAGNOSIS — M25551 Pain in right hip: Secondary | ICD-10-CM | POA: Diagnosis not present

## 2023-10-25 MED ORDER — IBUPROFEN 800 MG PO TABS
800.0000 mg | ORAL_TABLET | Freq: Three times a day (TID) | ORAL | 0 refills | Status: AC
  Filled 2023-10-25: qty 30, 10d supply, fill #0

## 2023-10-25 MED ORDER — OXYCODONE HCL 5 MG PO TABS
5.0000 mg | ORAL_TABLET | ORAL | 0 refills | Status: DC | PRN
  Filled 2023-10-25: qty 15, 3d supply, fill #0

## 2023-10-25 MED ORDER — ACETAMINOPHEN 500 MG PO TABS
500.0000 mg | ORAL_TABLET | Freq: Three times a day (TID) | ORAL | 0 refills | Status: AC
  Filled 2023-10-25: qty 30, 10d supply, fill #0

## 2023-10-25 MED ORDER — ASPIRIN 325 MG PO TBEC
325.0000 mg | DELAYED_RELEASE_TABLET | Freq: Every day | ORAL | 0 refills | Status: AC
  Filled 2023-10-25: qty 14, 14d supply, fill #0

## 2023-10-25 NOTE — Progress Notes (Signed)
 Post Operative Evaluation     Procedure/Date of Surgery: Right rotator cuff repair with collagen patch augmentation 12/5   Interval History:  . Presents today for MRI discussion of both the right shoulder as well as the right hip.  Right hip is continuing to bother her more and she is having symptoms with laying directly on the side.  PMH/PSH/Family History/Social History/Meds/Allergies:    Past Medical History:  Diagnosis Date   Anemia    Asthma    well controlled   Colon polyps    Complication of anesthesia    DDD (degenerative disc disease), lumbar    Depression    GERD (gastroesophageal reflux disease)    occ   Iron  deficiency anemia    Migraine    Morbid obesity (HCC)    Osteoarthritis    Osteoarthritis of right hip    PONV (postoperative nausea and vomiting)    with c-section x 1   Right rotator cuff tear 11/2022   Vitamin D  deficiency    Past Surgical History:  Procedure Laterality Date   BREAST CYST ASPIRATION Left    CESAREAN SECTION  1990   CESAREAN SECTION  1993   CESAREAN SECTION  1996   COLONOSCOPY N/A 09/22/2020   Procedure: COLONOSCOPY;  Surgeon: Janalyn Keene NOVAK, MD;  Location: [facility];  Service: Endoscopy;  Laterality: N/A;   LAPAROSCOPY  2018   LEEP  2005   benign pathology; Dr. Rolm   SHOULDER ACROMIOPLASTY Right 01/03/2023   Procedure: RIGHT SHOULDER ACROMIOPLASTY;  Surgeon: Genelle Standing, MD;  Location: [facility];  Service: Orthopedics;  Laterality: Right;   SHOULDER ARTHROSCOPY WITH ROTATOR CUFF REPAIR AND OPEN BICEPS TENODESIS Right 01/03/2023   Procedure: RIGHT SHOULDER ARTHROSCOPY WITH ROTATOR CUFF REPAIR AND  BICEPS TENODESIS;  Surgeon: Genelle Standing, MD;  Location: [facility];  Service: Orthopedics;  Laterality: Right;   SUPRACERVICAL ABDOMINAL HYSTERECTOMY  2010   leio/AUB; Dr. Rolm   TOTAL HIP ARTHROPLASTY Right 11/12/2018   Procedure: TOTAL HIP ARTHROPLASTY ANTERIOR APPROACH;   Surgeon: Leora Lynwood SAUNDERS, MD;  Location: [facility];  Service: Orthopedics;  Laterality: Right;   Social History   Socioeconomic History   Marital status: Divorced    Spouse name: Not on file   Number of children: 3   Years of education: Not on file   Highest education level: Associate degree: occupational, Scientist, product/process development, or vocational program  Occupational History   Not on file  Tobacco Use   Smoking status: Never   Smokeless tobacco: Never  Vaping Use   Vaping status: Never Used  Substance and Sexual Activity   Alcohol use: Yes    Alcohol/week: 0.0 standard drinks of alcohol    Comment: occasionally   Drug use: No   Sexual activity: Yes    Birth control/protection: Surgical    Comment: Hysterectomy  Other Topics Concern   Not on file  Social History Narrative   Not on file   Social Drivers of Health   Financial Resource Strain: Low Risk  (10/19/2023)   Overall Financial Resource Strain (CARDIA)    Difficulty of Paying Living Expenses: Not very hard  Food Insecurity: Patient Declined (10/19/2023)   Hunger Vital Sign    Worried About Running Out of Food in the Last Year: Patient declined    Barista in  the Last Year: Patient declined  Transportation Needs: No Transportation Needs (10/19/2023)   PRAPARE - Administrator, Civil Service (Medical): No    Lack of Transportation (Non-Medical): No  Physical Activity: Inactive (10/19/2023)   Exercise Vital Sign    Days of Exercise per Week: 0 days    Minutes of Exercise per Session: Not on file  Stress: Stress Concern Present (10/19/2023)   [employer] of Occupational Health - Occupational Stress Questionnaire    Feeling of Stress: Rather much  Social Connections: Socially Isolated (10/19/2023)   Social Connection and Isolation Panel    Frequency of Communication with Friends and Family: More than three times a week    Frequency of Social Gatherings with Friends and Family: Three times a week    Attends  Religious Services: Patient declined    Active Member of Clubs or Organizations: No    Attends Engineer, structural: Not on file    Marital Status: Divorced   Family History  Problem Relation Age of Onset   Depression Mother    Bipolar disorder Brother    Colon cancer Maternal Grandfather        not sure of age   Breast cancer Neg Hx    Allergies  Allergen Reactions   Celecoxib Hives   Prednisone      intolerance, makes her angry. Makes her angry   Topamax  [Topiramate ]     Mood changes   Zolpidem     Mood changes   Codeine Rash   Penicillins Rash    Did it involve swelling of the face/tongue/throat, SOB, or low BP? No Did it involve sudden or severe rash/hives, skin peeling, or any reaction on the inside of your mouth or nose? Yes Did you need to seek medical attention at a hospital or doctor's office? Yes When did it last happen? More than 20 years ago       If all above answers are "NO", may proceed with cephalosporin use.    Current Outpatient Medications  Medication Sig Dispense Refill   acetaminophen  (TYLENOL ) 500 MG tablet Take 1 tablet (500 mg total) by mouth every 8 (eight) hours for 10 days. 30 tablet 0   aspirin  EC 325 MG tablet Take 1 tablet (325 mg total) by mouth daily. 14 tablet 0   ibuprofen  (ADVIL ) 800 MG tablet Take 1 tablet (800 mg total) by mouth every 8 (eight) hours for 10 days. Please take with food, please alternate with acetaminophen  30 tablet 0   oxyCODONE  (ROXICODONE ) 5 MG immediate release tablet Take 1 tablet (5 mg total) by mouth every 4 (four) hours as needed for severe pain (pain score 7-10) or breakthrough pain. 15 tablet 0   albuterol  (PROVENTIL ) (2.5 MG/3ML) 0.083% nebulizer solution Take 3 mLs (2.5 mg total) by nebulization every 6 (six) hours as needed for wheezing or shortness of breath 75 mL 0   Albuterol -Budesonide  (AIRSUPRA ) 90-80 MCG/ACT AERO Inhale 1-2 puffs into the lungs as needed (wheezing). 10.7 g 5   desvenlafaxine   (PRISTIQ ) 50 MG 24 hr tablet Take 1 tablet (50 mg total) by mouth daily. 90 tablet 1   diclofenac  Sodium (VOLTAREN ) 1 % GEL Apply 2 grams topically 4 (four) times daily. 100 g 1   famotidine  (PEPCID ) 40 MG tablet Take 1 tablet (40 mg total) by mouth daily. 90 tablet 1   Fluticasone -Umeclidin-Vilant (TRELEGY ELLIPTA ) 200-62.5-25 MCG/ACT AEPB Inhale 1 puff into the lungs daily. (Patient taking differently: Inhale 1 puff into the lungs  at bedtime.) 60 each 6   gabapentin  (NEURONTIN ) 300 MG capsule Take 3 capsules (900 mg total) by mouth at bedtime. 270 capsule 1   Galcanezumab -gnlm (EMGALITY ) 120 MG/ML SOAJ Inject 1 mL into the skin every 30 (thirty) days. 1 mL 5   lidocaine  (LIDODERM ) 5 % Place 2 patches onto the skin daily. Remove & Discard patch within 12 hours or as directed by MD 60 patch 2   pantoprazole  (PROTONIX ) 40 MG tablet Take 1 tablet (40 mg total) by mouth daily. (Patient taking differently: Take 40 mg by mouth daily as needed.) 30 tablet 1   prazosin  (MINIPRESS ) 2 MG capsule Take 1 capsule (2 mg total) by mouth at bedtime. 90 capsule 1   No current facility-administered medications for this visit.   MR HIP RIGHT WO CONTRAST Result Date: 10/25/2023 EXAM DESCRIPTION: MR HIP RIGHT WO CONTRAST CLINICAL HISTORY: Hip pain, chronic, tendon/bursal abnormality suspected, xray done COMPARISON: None Available. TECHNIQUE: MRI of the hip is performed according to our usual protocol with multiplanar multi sequence imaging. FINDINGS: Right hip arthroplasty in place. Alignment unremarkable. No fracture or dislocation. The hardware appears intact. The visualized marrow signal is unremarkable. The sacroiliac joints are unremarkable. No significant joint effusion or bursal fluid appreciated. The visualized tendons and musculature are unremarkable. No subcutaneous edema. IMPRESSION: Unremarkable right hip arthroplasty by MRI. Further assessment with bone scan if clinically warranted. Electronically signed by:  Reyes Frees MD 10/25/2023 11:41 AM EDT RP Workstation: [id number]   MR Shoulder Right Wo Contrast Result Date: 10/25/2023 EXAM DESCRIPTION: MR SHOULDER RIGHT WO CONTRAST CLINICAL HISTORY: 53 years, Female, Shoulder pain, chronic, rotator cuff disorder suspected, xray done COMPARISON: None TECHNIQUE: MRI of the shoulder was performed with multiplanar multi sequence imaging according to our usual protocol. FINDINGS: Prior rotator cuff surgery. There is a recurrent full-thickness tear of the supraspinatus tendon with most fibers retracted at least 2 cm. Mild muscle belly atrophy. Mild bursal fluid. Mild to moderate insertional tendinopathy of the infraspinatus tendon. The subscapularis and teres minor tendons are unremarkable. No significant joint effusion. No active labral tear. There is a partial tear to the intra-articular biceps tendon as well as within the proximal bicipital groove with thinned fibers. Mild acromioclavicular osteoarthritis. IMPRESSION: Recurrent retracted full-thickness supraspinatus tendon tear. Mild muscle belly atrophy. Partial tear of the proximal biceps tendon. I believe there are intact attenuated fibers. Mild acromioclavicular osteoarthritis. Electronically signed by: Reyes Frees MD 10/25/2023 09:10 AM EDT RP Workstation: [id number]     Review of Systems:   A ROS was performed including pertinent positives and negatives as documented in the HPI.   Musculoskeletal Exam:    There were no vitals taken for this visit.  Right shoulder incisions are well-appearing without erythema or drainage.  Passive range of motion as well as active range of motion is to 135 degrees in the standing position against gravity.  External rotation at side is 30 degrees 2+ radial pulse.  Distal neurosensory exam is intact.  Sling is well-fitting after adjustment  Right hip with positive Trendelenburg gait and pain over the trochanter with pain with resisted abduction of the right leg.  There  is 3 degrees internal/external rotation without pain.  Distal neurosensory exam is intact  Imaging:    MRI right shoulder: There does appear a right shoulder recurrent tear of the supraspinatus  Right hip MRI: There is a near full-thickness tear of the gluteus medius and minimus with evidence of chronic atrophy on the T1 view  I personally  reviewed and interpreted the radiographs.   Assessment:   Status post right shoulder rotator cuff repair with collagen patch augmentation unfortunately with evidence of a recurrent tear of the right shoulder.  We did discuss treatment options and she would like to defer treatment of this today as her right hip has become more problematic.  I did discuss that her MRI does show evidence of full-thickness gluteus medius and minimus tear with significant muscle atrophy status post right total hip arthroplasty.  At this time she has weakness and pain.  She has tried physical therapy as well as injections in the past without relief.  Given this I did discuss surgical options including gluteus maximus tendon transfer which I do believe would be ideal given the chronicity and multiple injections that she has had in the past.  At this time she would like to pursue this and we will proceed with this  -Plan for right hip gluteus maximus tendon transfer   After a lengthy discussion of treatment options, including risks, benefits, alternatives, complications of surgical and nonsurgical conservative options, the patient elected surgical repair.   The patient  is aware of the material risks  and complications including, but not limited to injury to adjacent structures, neurovascular injury, infection, numbness, bleeding, implant failure, thermal burns, stiffness, persistent pain, failure to heal, disease transmission from allograft, need for further surgery, dislocation, anesthetic risks, blood clots, risks of death,and others. The probabilities of surgical success and  failure discussed with patient given their particular co-morbidities.The time and nature of expected rehabilitation and recovery was discussed.The patient's questions were all answered preoperatively.  No barriers to understanding were noted. I explained the natural history of the disease process and Rx rationale.  I explained to the patient what I considered to be reasonable expectations given their personal situation.  The final treatment plan was arrived at through a shared patient decision making process model.    I personally saw and evaluated the patient, and participated in the management and treatment plan.  Elspeth Parker, MD Attending Physician, Orthopedic Surgery  This document was dictated using Dragon voice recognition software. A reasonable attempt at proof reading has been made to minimize errors.

## 2023-10-29 ENCOUNTER — Other Ambulatory Visit (HOSPITAL_COMMUNITY): Payer: Self-pay

## 2023-10-29 ENCOUNTER — Telehealth: Payer: Self-pay | Admitting: Pharmacy Technician

## 2023-10-29 NOTE — Telephone Encounter (Signed)
 Pharmacy Patient Advocate Encounter   Received notification from CoverMyMeds that prior authorization for Emgality  120MG /ML auto-injectors is due for renewal.   Insurance verification completed.   The patient is insured through [employer]  Action: PA required; PA submitted to above mentioned insurance via Latent Key/confirmation #/EOC [id number] Status is pending

## 2023-11-01 ENCOUNTER — Other Ambulatory Visit (HOSPITAL_COMMUNITY): Payer: Self-pay

## 2023-11-01 ENCOUNTER — Ambulatory Visit
Admission: RE | Admit: 2023-11-01 | Discharge: 2023-11-01 | Disposition: A | Discharge status: Home / Self Care | Source: Ambulatory Visit | Attending: Obstetrics and Gynecology | Admitting: Obstetrics and Gynecology

## 2023-11-01 DIAGNOSIS — Z1231 Encounter for screening mammogram for malignant neoplasm of breast: Secondary | ICD-10-CM | POA: Insufficient documentation

## 2023-11-01 NOTE — Telephone Encounter (Signed)
 Pharmacy Patient Advocate Encounter  Received notification from [facility] that Prior Authorization for Emgality  120MG /ML auto-injectors  has been APPROVED from 10/29/23 to 10/28/24. Ran test claim, Copay is $35. This test claim was processed through [facility]- copay amounts may vary at other pharmacies due to pharmacy/plan contracts, or as the patient moves through the different stages of their insurance plan.   PA #/Case ID/Reference #: [id number]

## 2023-11-04 ENCOUNTER — Other Ambulatory Visit: Payer: Self-pay

## 2023-11-04 ENCOUNTER — Other Ambulatory Visit (HOSPITAL_COMMUNITY): Payer: Self-pay

## 2023-11-05 ENCOUNTER — Other Ambulatory Visit: Payer: Self-pay | Admitting: Obstetrics and Gynecology

## 2023-11-05 ENCOUNTER — Ambulatory Visit: Payer: Self-pay | Admitting: Obstetrics and Gynecology

## 2023-11-05 DIAGNOSIS — R928 Other abnormal and inconclusive findings on diagnostic imaging of breast: Secondary | ICD-10-CM

## 2023-11-06 ENCOUNTER — Ambulatory Visit
Admission: RE | Admit: 2023-11-06 | Discharge: 2023-11-06 | Disposition: A | Discharge status: Home / Self Care | Source: Ambulatory Visit | Attending: Obstetrics and Gynecology | Admitting: Obstetrics and Gynecology

## 2023-11-06 DIAGNOSIS — R928 Other abnormal and inconclusive findings on diagnostic imaging of breast: Secondary | ICD-10-CM | POA: Diagnosis not present

## 2023-11-06 DIAGNOSIS — R92333 Mammographic heterogeneous density, bilateral breasts: Secondary | ICD-10-CM | POA: Diagnosis not present

## 2023-11-07 ENCOUNTER — Ambulatory Visit: Payer: Self-pay | Admitting: Obstetrics and Gynecology

## 2023-11-07 ENCOUNTER — Encounter (HOSPITAL_BASED_OUTPATIENT_CLINIC_OR_DEPARTMENT_OTHER): Payer: Self-pay | Admitting: Orthopaedic Surgery

## 2023-11-18 ENCOUNTER — Other Ambulatory Visit: Payer: Self-pay

## 2023-11-21 ENCOUNTER — Encounter (HOSPITAL_BASED_OUTPATIENT_CLINIC_OR_DEPARTMENT_OTHER): Payer: Self-pay | Admitting: Orthopaedic Surgery

## 2023-11-28 ENCOUNTER — Other Ambulatory Visit: Payer: Self-pay

## 2023-11-29 ENCOUNTER — Other Ambulatory Visit: Payer: Self-pay

## 2023-12-02 ENCOUNTER — Encounter: Payer: Self-pay | Admitting: Radiology

## 2023-12-06 ENCOUNTER — Ambulatory Visit: Admitting: Family Medicine

## 2023-12-07 ENCOUNTER — Other Ambulatory Visit: Payer: Self-pay

## 2023-12-09 ENCOUNTER — Encounter (HOSPITAL_BASED_OUTPATIENT_CLINIC_OR_DEPARTMENT_OTHER): Payer: Self-pay | Admitting: Orthopaedic Surgery

## 2023-12-10 ENCOUNTER — Encounter (HOSPITAL_BASED_OUTPATIENT_CLINIC_OR_DEPARTMENT_OTHER): Payer: Self-pay | Admitting: Orthopaedic Surgery

## 2023-12-10 ENCOUNTER — Other Ambulatory Visit: Payer: Self-pay

## 2023-12-10 ENCOUNTER — Other Ambulatory Visit: Payer: Self-pay | Admitting: Orthopaedic Surgery

## 2023-12-10 MED ORDER — LORAZEPAM 1 MG PO TABS
1.0000 mg | ORAL_TABLET | Freq: Three times a day (TID) | ORAL | 0 refills | Status: DC
  Filled 2023-12-10: qty 2, 1d supply, fill #0

## 2023-12-16 ENCOUNTER — Other Ambulatory Visit: Payer: Self-pay | Admitting: Orthopaedic Surgery

## 2023-12-16 ENCOUNTER — Other Ambulatory Visit: Payer: Self-pay

## 2023-12-16 MED ORDER — LORAZEPAM 1 MG PO TABS
1.0000 mg | ORAL_TABLET | Freq: Three times a day (TID) | ORAL | 0 refills | Status: AC
  Filled 2023-12-16: qty 2, 1d supply, fill #0

## 2023-12-17 ENCOUNTER — Ambulatory Visit (HOSPITAL_BASED_OUTPATIENT_CLINIC_OR_DEPARTMENT_OTHER): Admitting: Certified Registered Nurse Anesthetist

## 2023-12-17 ENCOUNTER — Encounter (HOSPITAL_BASED_OUTPATIENT_CLINIC_OR_DEPARTMENT_OTHER): Payer: Self-pay | Admitting: Orthopaedic Surgery

## 2023-12-17 ENCOUNTER — Ambulatory Visit (HOSPITAL_BASED_OUTPATIENT_CLINIC_OR_DEPARTMENT_OTHER)
Admission: RE | Admit: 2023-12-17 | Discharge: 2023-12-17 | Disposition: A | Discharge status: Home / Self Care | Attending: Orthopaedic Surgery | Admitting: Orthopaedic Surgery

## 2023-12-17 ENCOUNTER — Other Ambulatory Visit: Payer: Self-pay

## 2023-12-17 ENCOUNTER — Encounter (HOSPITAL_BASED_OUTPATIENT_CLINIC_OR_DEPARTMENT_OTHER): Admission: RE | Disposition: A | Payer: Self-pay | Source: Home / Self Care | Attending: Orthopaedic Surgery

## 2023-12-17 DIAGNOSIS — K219 Gastro-esophageal reflux disease without esophagitis: Secondary | ICD-10-CM | POA: Diagnosis not present

## 2023-12-17 DIAGNOSIS — M67951 Unspecified disorder of synovium and tendon, right thigh: Secondary | ICD-10-CM

## 2023-12-17 DIAGNOSIS — Z87891 Personal history of nicotine dependence: Secondary | ICD-10-CM | POA: Diagnosis not present

## 2023-12-17 DIAGNOSIS — X58XXXA Exposure to other specified factors, initial encounter: Secondary | ICD-10-CM | POA: Insufficient documentation

## 2023-12-17 DIAGNOSIS — J45909 Unspecified asthma, uncomplicated: Secondary | ICD-10-CM | POA: Insufficient documentation

## 2023-12-17 DIAGNOSIS — S76011A Strain of muscle, fascia and tendon of right hip, initial encounter: Secondary | ICD-10-CM | POA: Insufficient documentation

## 2023-12-17 DIAGNOSIS — Z01818 Encounter for other preprocedural examination: Secondary | ICD-10-CM

## 2023-12-17 DIAGNOSIS — M1990 Unspecified osteoarthritis, unspecified site: Secondary | ICD-10-CM | POA: Insufficient documentation

## 2023-12-17 HISTORY — DX: Anxiety disorder, unspecified: F41.9

## 2023-12-17 HISTORY — PX: OPEN SURGICAL REPAIR OF GLUTEAL TENDON: SHX5995

## 2023-12-17 SURGERY — REPAIR, TENDON, GLUTEUS MEDIUS, OPEN
Anesthesia: General | Site: Hip | Laterality: Right

## 2023-12-17 MED ORDER — ONDANSETRON HCL 4 MG/2ML IJ SOLN
INTRAMUSCULAR | Status: DC | PRN
  Administered 2023-12-17: 4 mg via INTRAVENOUS

## 2023-12-17 MED ORDER — OXYCODONE HCL 5 MG PO TABS
5.0000 mg | ORAL_TABLET | Freq: Once | ORAL | Status: AC | PRN
  Administered 2023-12-17: 5 mg via ORAL

## 2023-12-17 MED ORDER — ROCURONIUM BROMIDE 10 MG/ML (PF) SYRINGE
PREFILLED_SYRINGE | INTRAVENOUS | Status: DC | PRN
Start: 2023-12-17 — End: 2023-12-17
  Administered 2023-12-17: 40 mg via INTRAVENOUS

## 2023-12-17 MED ORDER — ALBUTEROL SULFATE HFA 108 (90 BASE) MCG/ACT IN AERS
INHALATION_SPRAY | RESPIRATORY_TRACT | Status: DC | PRN
  Administered 2023-12-17: 4 via RESPIRATORY_TRACT

## 2023-12-17 MED ORDER — TRANEXAMIC ACID-NACL 1000-0.7 MG/100ML-% IV SOLN
1000.0000 mg | INTRAVENOUS | Status: AC
  Administered 2023-12-17: 1000 mg via INTRAVENOUS

## 2023-12-17 MED ORDER — IPRATROPIUM-ALBUTEROL 0.5-2.5 (3) MG/3ML IN SOLN
3.0000 mL | Freq: Once | RESPIRATORY_TRACT | Status: AC
  Administered 2023-12-17: 3 mL via RESPIRATORY_TRACT

## 2023-12-17 MED ORDER — OXYCODONE HCL 5 MG/5ML PO SOLN: 5.0000 mg | Freq: Once | ORAL | Status: AC | PRN

## 2023-12-17 MED ORDER — GABAPENTIN 300 MG PO CAPS: 300.0000 mg | ORAL_CAPSULE | Freq: Once | ORAL | Status: DC

## 2023-12-17 MED ORDER — PROPOFOL 10 MG/ML IV BOLUS
INTRAVENOUS | Status: AC
  Filled 2023-12-17: qty 20

## 2023-12-17 MED ORDER — GABAPENTIN 300 MG PO CAPS
ORAL_CAPSULE | ORAL | Status: AC
  Filled 2023-12-17: qty 1

## 2023-12-17 MED ORDER — FENTANYL CITRATE (PF) 250 MCG/5ML IJ SOLN
INTRAMUSCULAR | Status: DC | PRN
  Administered 2023-12-17: 50 ug via INTRAVENOUS
  Administered 2023-12-17: 100 ug via INTRAVENOUS

## 2023-12-17 MED ORDER — BUPIVACAINE HCL (PF) 0.25 % IJ SOLN
INTRAMUSCULAR | Status: AC
  Filled 2023-12-17: qty 30

## 2023-12-17 MED ORDER — DEXAMETHASONE SOD PHOSPHATE PF 10 MG/ML IJ SOLN
INTRAMUSCULAR | Status: DC | PRN
  Administered 2023-12-17: 10 mg via INTRAVENOUS

## 2023-12-17 MED ORDER — VANCOMYCIN HCL 1000 MG IV SOLR
INTRAVENOUS | Status: AC
  Filled 2023-12-17: qty 20

## 2023-12-17 MED ORDER — OXYCODONE HCL 5 MG PO TABS
ORAL_TABLET | ORAL | Status: AC
  Filled 2023-12-17: qty 1

## 2023-12-17 MED ORDER — CEFAZOLIN SODIUM-DEXTROSE 2-4 GM/100ML-% IV SOLN
INTRAVENOUS | Status: AC
  Filled 2023-12-17: qty 100

## 2023-12-17 MED ORDER — MIDAZOLAM HCL 2 MG/2ML IJ SOLN
INTRAMUSCULAR | Status: AC
  Filled 2023-12-17: qty 2

## 2023-12-17 MED ORDER — TRANEXAMIC ACID-NACL 1000-0.7 MG/100ML-% IV SOLN
INTRAVENOUS | Status: AC
  Filled 2023-12-17: qty 100

## 2023-12-17 MED ORDER — IPRATROPIUM-ALBUTEROL 0.5-2.5 (3) MG/3ML IN SOLN
RESPIRATORY_TRACT | Status: AC
Start: 2023-12-17 — End: 2023-12-17
  Filled 2023-12-17: qty 3

## 2023-12-17 MED ORDER — BUPIVACAINE HCL 0.25 % IJ SOLN
INTRAMUSCULAR | Status: DC | PRN
Start: 2023-12-17 — End: 2023-12-17
  Administered 2023-12-17: 30 mL

## 2023-12-17 MED ORDER — CEFAZOLIN SODIUM-DEXTROSE 2-4 GM/100ML-% IV SOLN
2.0000 g | INTRAVENOUS | Status: AC
  Administered 2023-12-17: 2 g via INTRAVENOUS

## 2023-12-17 MED ORDER — LIDOCAINE 2% (20 MG/ML) 5 ML SYRINGE
INTRAMUSCULAR | Status: AC
  Filled 2023-12-17: qty 5

## 2023-12-17 MED ORDER — FENTANYL CITRATE (PF) 100 MCG/2ML IJ SOLN: 25.0000 ug | INTRAMUSCULAR | Status: DC | PRN

## 2023-12-17 MED ORDER — LACTATED RINGERS IV SOLN: INTRAVENOUS | Status: DC

## 2023-12-17 MED ORDER — SUGAMMADEX SODIUM 200 MG/2ML IV SOLN
INTRAVENOUS | Status: DC | PRN
  Administered 2023-12-17: 40 mg via INTRAVENOUS
  Administered 2023-12-17: 160 mg via INTRAVENOUS

## 2023-12-17 MED ORDER — FENTANYL CITRATE (PF) 100 MCG/2ML IJ SOLN
INTRAMUSCULAR | Status: AC
  Filled 2023-12-17: qty 2

## 2023-12-17 MED ORDER — ROCURONIUM BROMIDE 10 MG/ML (PF) SYRINGE
PREFILLED_SYRINGE | INTRAVENOUS | Status: AC
  Filled 2023-12-17: qty 10

## 2023-12-17 MED ORDER — PROPOFOL 10 MG/ML IV BOLUS
INTRAVENOUS | Status: DC | PRN
Start: 2023-12-17 — End: 2023-12-17
  Administered 2023-12-17: 120 mg via INTRAVENOUS
  Administered 2023-12-17: 20 mg via INTRAVENOUS

## 2023-12-17 MED ORDER — LIDOCAINE 2% (20 MG/ML) 5 ML SYRINGE
INTRAMUSCULAR | Status: DC | PRN
  Administered 2023-12-17: 80 mg via INTRAVENOUS

## 2023-12-17 MED ORDER — 0.9 % SODIUM CHLORIDE (POUR BTL) OPTIME
TOPICAL | Status: DC | PRN
  Administered 2023-12-17: 1000 mL

## 2023-12-17 MED ORDER — ONDANSETRON HCL 4 MG/2ML IJ SOLN
INTRAMUSCULAR | Status: AC
  Filled 2023-12-17: qty 2

## 2023-12-17 MED ORDER — ACETAMINOPHEN 500 MG PO TABS
ORAL_TABLET | ORAL | Status: AC
  Filled 2023-12-17: qty 2

## 2023-12-17 MED ORDER — MIDAZOLAM HCL (PF) 2 MG/2ML IJ SOLN
INTRAMUSCULAR | Status: DC | PRN
  Administered 2023-12-17: 2 mg via INTRAVENOUS

## 2023-12-17 MED ORDER — ACETAMINOPHEN 500 MG PO TABS
1000.0000 mg | ORAL_TABLET | Freq: Once | ORAL | Status: AC
  Administered 2023-12-17: 1000 mg via ORAL

## 2023-12-17 MED ORDER — DROPERIDOL 2.5 MG/ML IJ SOLN: 0.6250 mg | Freq: Once | INTRAMUSCULAR | Status: DC | PRN

## 2023-12-17 SURGICAL SUPPLY — 46 items
ANCHOR JUGGERKNOT SOFT 2.9 (Anchor) IMPLANT
ANCHOR SUT QUATTRO KNTLS 4.5 (Anchor) IMPLANT
BLADE SURG 10 STRL SS (BLADE) ×1 IMPLANT
BLADE SURG 15 STRL LF DISP TIS (BLADE) ×1 IMPLANT
CANISTER SUCT 1200ML W/VALVE (MISCELLANEOUS) ×1 IMPLANT
CHLORAPREP W/TINT 26 (MISCELLANEOUS) ×1 IMPLANT
CLSR STERI-STRIP ANTIMIC 1/2X4 (GAUZE/BANDAGES/DRESSINGS) IMPLANT
COOLER ICEMAN CLASSIC (MISCELLANEOUS) ×1 IMPLANT
COVER BACK TABLE 60X90IN (DRAPES) ×1 IMPLANT
COVER MAYO STAND STRL (DRAPES) ×1 IMPLANT
DERMABOND ADVANCED .7 DNX12 (GAUZE/BANDAGES/DRESSINGS) IMPLANT
DRAPE STERI IOBAN 125X83 (DRAPES) ×1 IMPLANT
DRAPE U-SHAPE 47X51 STRL (DRAPES) ×2 IMPLANT
DRSG AQUACEL AG ADV 3.5X 6 (GAUZE/BANDAGES/DRESSINGS) ×1 IMPLANT
DRSG AQUACEL AG ADV 3.5X10 (GAUZE/BANDAGES/DRESSINGS) IMPLANT
ELECTRODE BLDE 4.0 EZ CLN MEGD (MISCELLANEOUS) IMPLANT
ELECTRODE REM PT RTRN 9FT ADLT (ELECTROSURGICAL) ×1 IMPLANT
GAUZE PAD ABD 8X10 STRL (GAUZE/BANDAGES/DRESSINGS) IMPLANT
GAUZE XEROFORM 1X8 LF (GAUZE/BANDAGES/DRESSINGS) IMPLANT
GLOVE BIO SURGEON STRL SZ 6 (GLOVE) ×2 IMPLANT
GLOVE BIO SURGEON STRL SZ7.5 (GLOVE) ×2 IMPLANT
GLOVE BIOGEL PI IND STRL 6.5 (GLOVE) ×1 IMPLANT
GLOVE BIOGEL PI IND STRL 8 (GLOVE) ×1 IMPLANT
GOWN STRL REUS W/ TWL LRG LVL3 (GOWN DISPOSABLE) ×2 IMPLANT
GOWN STRL REUS W/TWL XL LVL3 (GOWN DISPOSABLE) ×1 IMPLANT
MANIFOLD NEPTUNE II (INSTRUMENTS) ×1 IMPLANT
NDL HYPO 22X1.5 SAFETY MO (MISCELLANEOUS) ×1 IMPLANT
NEEDLE HYPO 22X1.5 SAFETY MO (MISCELLANEOUS) ×1 IMPLANT
PACK BASIN DAY SURGERY FS (CUSTOM PROCEDURE TRAY) ×1 IMPLANT
PAD COLD SHLDR WRAP-ON (PAD) ×1 IMPLANT
PENCIL SMOKE EVACUATOR (MISCELLANEOUS) ×1 IMPLANT
SLEEVE SCD COMPRESS KNEE MED (STOCKING) ×1 IMPLANT
SOLN 0.9% NACL POUR BTL 1000ML (IV SOLUTION) ×1 IMPLANT
SPIKE FLUID TRANSFER (MISCELLANEOUS) IMPLANT
SPONGE T-LAP 18X18 ~~LOC~~+RFID (SPONGE) ×1 IMPLANT
SUCTION TUBE FRAZIER 10FR DISP (SUCTIONS) ×1 IMPLANT
SUT ETHILON 3 0 PS 1 (SUTURE) IMPLANT
SUT MNCRL AB 3-0 PS2 27 (SUTURE) IMPLANT
SUT VIC AB 0 CT1 27XBRD ANBCTR (SUTURE) ×1 IMPLANT
SUT VIC AB 2-0 CT1 TAPERPNT 27 (SUTURE) ×1 IMPLANT
SYR 20ML LL LF (SYRINGE) ×1 IMPLANT
SYR BULB EAR ULCER 3OZ GRN STR (SYRINGE) ×1 IMPLANT
TOWEL GREEN STERILE FF (TOWEL DISPOSABLE) ×2 IMPLANT
TUBE CONNECTING 20X1/4 (TUBING) ×1 IMPLANT
UNDERPAD 30X36 HEAVY ABSORB (UNDERPADS AND DIAPERS) IMPLANT
YANKAUER SUCT BULB TIP NO VENT (SUCTIONS) ×1 IMPLANT

## 2023-12-17 NOTE — Anesthesia Postprocedure Evaluation (Signed)
 Anesthesia Post Note  Patient: Shelby Henson  Procedure(s) Performed: REPAIR, TENDON, GLUTEUS MEDIUS, OPEN (Right: Hip)     Patient location during evaluation: PACU Anesthesia Type: General Level of consciousness: awake and alert Pain management: pain level controlled Vital Signs Assessment: post-procedure vital signs reviewed and stable Respiratory status: spontaneous breathing, nonlabored ventilation, respiratory function stable and patient connected to nasal cannula oxygen Cardiovascular status: blood pressure returned to baseline and stable Postop Assessment: no apparent nausea or vomiting Anesthetic complications: no   No notable events documented.  Last Vitals:  Vitals:   12/17/23 1000 12/17/23 1017  BP: (!) 144/77 (!) 147/79  Pulse: 71 69  Resp: 11 12  Temp:  36.5 C  SpO2: 94% 96%    Last Pain:  Vitals:   12/17/23 1013  TempSrc:   PainSc: 4                  Rome Ade

## 2023-12-17 NOTE — Discharge Instructions (Addendum)
 Discharge Instructions    Attending Surgeon: Elspeth Parker, MD Office Phone Number: [phone]   Diagnosis and Procedures:    Surgeries Performed: Right hip gluteus medius repair with gluteus maximus tendon transfer  Discharge Plan:    Diet: Resume usual diet. Begin with light or bland foods.  Drink plenty of fluids.  Activity:  Weight bearing as tolerated right leg with walker. You are advised to go home directly from the hospital or surgical center. Restrict your activities.  GENERAL INSTRUCTIONS: 1.  Please apply ice to your wound to help with swelling and inflammation. This will improve your comfort and your overall recovery following surgery.     2. Please call Dr. Danetta office at [phone] with questions Monday-Friday during business hours. If no one answers, please leave a message and someone should get back to the patient within 24 hours. For emergencies please call 911 or proceed to the emergency room.   3. Patient to notify surgical team if experiences any of the following: Bowel/Bladder dysfunction, uncontrolled pain, nerve/muscle weakness, incision with increased drainage or redness, nausea/vomiting and Fever greater than 101.0 F.  Be alert for signs of infection including redness, streaking, odor, fever or chills. Be alert for excessive pain or bleeding and notify your surgeon immediately.  WOUND INSTRUCTIONS:   Leave your dressing, cast, or splint in place until your post operative visit.  Keep it clean and dry.  Always keep the incision clean and dry until the staples/sutures are removed. If there is no drainage from the incision you should keep it open to air. If there is drainage from the incision you must keep it covered at all times until the drainage stops  Do not soak in a bath tub, hot tub, pool, lake or other body of water until 21 days after your surgery and your incision is completely dry and healed.  If you have removable sutures (or  staples) they must be removed 10-14 days (unless otherwise instructed) from the day of your surgery.     1)  Elevate the extremity as much as possible.  2)  Keep the dressing clean and dry.  3)  Please call us  if the dressing becomes wet or dirty.  4)  If you are experiencing worsening pain or worsening swelling, please call.     MEDICATIONS: Resume all previous home medications at the previous prescribed dose and frequency unless otherwise noted Start taking the  pain medications on an as-needed basis as prescribed  Please taper down pain medication over the next week following surgery.  Ideally you should not require a refill of any narcotic pain medication.  Take pain medication with food to minimize nausea. In addition to the prescribed pain medication, you may take over-the-counter pain relievers such as Tylenol .  Do NOT take additional tylenol  if your pain medication already has tylenol  in it.  Aspirin  325mg  daily per instructions on bottle. Narcotic policy: Per [facility] clinic policy, our goal is ensure optimal postoperative pain control with a multimodal pain management strategy. For all [facility] patients, our goal is to wean post-operative narcotic medications by 6 weeks post-operatively, and many times sooner. If this is not possible due to utilization of pain medication prior to surgery, your [facility] doctor will support your acute post-operative pain control for the first 6 weeks postoperatively, with a plan to transition you back to your primary pain team following that. Maralee will work to ensure a therapist, occupational.       FOLLOWUP  INSTRUCTIONS: 1. Follow up at the Physical Therapy Clinic 3-4 days following surgery. This appointment should be scheduled unless other arrangements have been made.The Physical Therapy scheduling number is [phone] if an appointment has not already been arranged.  2. Contact Dr. Danetta office during office hours at [phone] or the  practice after hours line at [phone] for non-emergencies. For medical emergencies call 911.   Discharge Location: Home   Post Anesthesia Home Care Instructions  Activity: Get plenty of rest for the remainder of the day. A responsible individual must stay with you for 24 hours following the procedure.  For the next 24 hours, DO NOT: -Drive a car -Advertising copywriter -Drink alcoholic beverages -Take any medication unless instructed by your physician -Make any legal decisions or sign important papers.  Meals: Start with liquid foods such as gelatin or soup. Progress to regular foods as tolerated. Avoid greasy, spicy, heavy foods. If nausea and/or vomiting occur, drink only clear liquids until the nausea and/or vomiting subsides. Call your physician if vomiting continues.  Special Instructions/Symptoms: Your throat may feel dry or sore from the anesthesia or the breathing tube placed in your throat during surgery. If this causes discomfort, gargle with warm salt water. The discomfort should disappear within 24 hours.  If you had a scopolamine patch placed behind your ear for the management of post- operative nausea and/or vomiting:  1. The medication in the patch is effective for 72 hours, after which it should be removed.  Wrap patch in a tissue and discard in the trash. Wash hands thoroughly with soap and water. 2. You may remove the patch earlier than 72 hours if you experience unpleasant side effects which may include dry mouth, dizziness or visual disturbances. 3. Avoid touching the patch. Wash your hands with soap and water after contact with the patch.

## 2023-12-17 NOTE — Brief Op Note (Signed)
   Brief Op Note  Date of Surgery: 12/17/2023  Preoperative Diagnosis: RIGHT GLUTEUS MEDIUS INSUFFICIENCY  Postoperative Diagnosis: same  Procedure: Procedure(s): REPAIR, TENDON, GLUTEUS MEDIUS, OPEN  Implants: Implant Name Type Inv. Item Serial No. Manufacturer Lot No. LRB No. Used Action  ANCHOR SUT QUATTRO KNTLS 4.5 - [id number] Anchor ANCHOR SUT QUATTRO KNTLS 4.5  [employer](ORTH,TRAU,BIO,SG) [id number] Right 2 Implanted  ANCHOR JUGGERKNOT SOFT 2.9 - [id number] Anchor ANCHOR JUGGERKNOT SOFT 2.9  [employer](ORTH,TRAU,BIO,SG) [id number] Right 2 Implanted    Surgeons: Surgeon(s): Genelle Standing, MD  Anesthesia: General    Estimated Blood Loss: See anesthesia record  Complications: None  Condition to PACU: Stable  Standing LITTIE Genelle, MD 12/17/2023 9:00 AM

## 2023-12-17 NOTE — Transfer of Care (Signed)
 Immediate Anesthesia Transfer of Care Note  Patient: Shelby Henson  Procedure(s) Performed: REPAIR, TENDON, GLUTEUS MEDIUS, OPEN (Right: Hip)  Patient Location: PACU  Anesthesia Type:General  Level of Consciousness: drowsy, patient cooperative, and responds to stimulation  Airway & Oxygen Therapy: Patient Spontanous Breathing and Patient connected to face mask oxygen  Post-op Assessment: Report given to RN and Post -op Vital signs reviewed and stable  Post vital signs: Reviewed and stable  Last Vitals:  Vitals Value Taken Time  BP 133/77 12/17/23 09:00  Temp    Pulse 68 12/17/23 09:00  Resp 17 12/17/23 09:00  SpO2 97 % 12/17/23 09:00  Vitals shown include unfiled device data.  Last Pain:  Vitals:   12/17/23 0856  TempSrc:   PainSc: Asleep      Patients Stated Pain Goal: 3 (12/17/23 0645)  Complications: No notable events documented.

## 2023-12-17 NOTE — Anesthesia Preprocedure Evaluation (Signed)
 Anesthesia Evaluation  Patient identified by MRN, date of birth, ID band Patient awake    Reviewed: Allergy & Precautions, NPO status , Patient's Chart, lab work & pertinent test results  History of Anesthesia Complications Negative for: history of anesthetic complications  Airway Mallampati: I  TM Distance: >3 FB Neck ROM: Full    Dental no notable dental hx. (+) Teeth Intact   Pulmonary asthma , neg sleep apnea, neg COPD, Patient abstained from smoking.Not current smoker Well controlled asthma, takes daily Trelegy. Almost never needs rescue inhaler   Pulmonary exam normal breath sounds clear to auscultation       Cardiovascular Exercise Tolerance: Good METS(-) hypertension(-) CAD and (-) Past MI negative cardio ROS (-) dysrhythmias  Rhythm:Regular Rate:Normal - Systolic murmurs    Neuro/Psych  Headaches PSYCHIATRIC DISORDERS Anxiety Depression     Neuromuscular disease    GI/Hepatic ,GERD  Medicated and Controlled,,(+)     (-) substance abuse    Endo/Other  neg diabetes    Renal/GU negative Renal ROS     Musculoskeletal  (+) Arthritis ,    Abdominal   Peds  Hematology   Anesthesia Other Findings Past Medical History: No date: Anemia No date: Anxiety No date: Asthma     Comment:  well controlled No date: Colon polyps No date: Complication of anesthesia No date: DDD (degenerative disc disease), lumbar No date: Depression No date: GERD (gastroesophageal reflux disease)     Comment:  occ No date: Iron  deficiency anemia No date: Migraine No date: Morbid obesity (HCC) No date: Osteoarthritis No date: Osteoarthritis of right hip No date: PONV (postoperative nausea and vomiting)     Comment:  with c-section x 1 11/2022: Right rotator cuff tear No date: Vitamin D  deficiency  Reproductive/Obstetrics                              Anesthesia Physical Anesthesia Plan  ASA:  2  Anesthesia Plan: General   Post-op Pain Management: Tylenol  PO (pre-op)*   Induction: Intravenous  PONV Risk Score and Plan: 3 and Ondansetron , Dexamethasone  and Midazolam   Airway Management Planned: Oral ETT  Additional Equipment: None  Intra-op Plan:   Post-operative Plan: Extubation in OR  Informed Consent: I have reviewed the patients History and Physical, chart, labs and discussed the procedure including the risks, benefits and alternatives for the proposed anesthesia with the patient or authorized representative who has indicated his/her understanding and acceptance.     Dental advisory given  Plan Discussed with: CRNA and Surgeon  Anesthesia Plan Comments: (Discussed risks of anesthesia with patient, including PONV, sore throat, lip/dental/eye damage. Rare risks discussed as well, such as cardiorespiratory and neurological sequelae, and allergic reactions. Discussed the role of CRNA in patient's perioperative care. Patient understands. OK for cefazolin . Discussed patient's prednisone  side effect, she is OK with receiving one dose of dexamethasone  for PONV.)        Anesthesia Quick Evaluation

## 2023-12-17 NOTE — Interval H&P Note (Signed)
 History and Physical Interval Note:  12/17/2023 7:06 AM  Shelby Henson  has presented today for surgery, with the diagnosis of RIGHT GLUTEUS MEDIUS INSUFFICIENCY.  The various methods of treatment have been discussed with the patient and family. After consideration of risks, benefits and other options for treatment, the patient has consented to  Procedure(s) with comments: REPAIR, TENDON, GLUTEUS MEDIUS, OPEN (Right) - RIGHT GLUTEUS MAXIMUS TENDON TRANSFER as a surgical intervention.  The patient's history has been reviewed, patient examined, no change in status, stable for surgery.  I have reviewed the patient's chart and labs.  Questions were answered to the patient's satisfaction.     [name]

## 2023-12-17 NOTE — H&P (Signed)
 Post Operative Evaluation        Procedure/Date of Surgery: Right rotator cuff repair with collagen patch augmentation 12/5    Interval History:  . Presents today for MRI discussion of both the right shoulder as well as the right hip.  Right hip is continuing to bother her more and she is having symptoms with laying directly on the side.   PMH/PSH/Family History/Social History/Meds/Allergies:         Past Medical History:  Diagnosis Date   Anemia     Asthma      well controlled   Colon polyps     Complication of anesthesia     DDD (degenerative disc disease), lumbar     Depression     GERD (gastroesophageal reflux disease)      occ   Iron  deficiency anemia     Migraine     Morbid obesity (HCC)     Osteoarthritis     Osteoarthritis of right hip     PONV (postoperative nausea and vomiting)      with c-section x 1   Right rotator cuff tear 11/2022   Vitamin D  deficiency               Past Surgical History:  Procedure Laterality Date   BREAST CYST ASPIRATION Left     CESAREAN SECTION   1990   CESAREAN SECTION   1993   CESAREAN SECTION   1996   COLONOSCOPY N/A 09/22/2020    Procedure: COLONOSCOPY;  Surgeon: Janalyn Keene NOVAK, MD;  Location: [facility];  Service: Endoscopy;  Laterality: N/A;   LAPAROSCOPY   2018   LEEP   2005    benign pathology; Dr. Rolm   SHOULDER ACROMIOPLASTY Right 01/03/2023    Procedure: RIGHT SHOULDER ACROMIOPLASTY;  Surgeon: Genelle Standing, MD;  Location: [facility];  Service: Orthopedics;  Laterality: Right;   SHOULDER ARTHROSCOPY WITH ROTATOR CUFF REPAIR AND OPEN BICEPS TENODESIS Right 01/03/2023    Procedure: RIGHT SHOULDER ARTHROSCOPY WITH ROTATOR CUFF REPAIR AND  BICEPS TENODESIS;  Surgeon: Genelle Standing, MD;  Location: [facility];  Service: Orthopedics;  Laterality: Right;   SUPRACERVICAL ABDOMINAL HYSTERECTOMY   2010    leio/AUB; Dr. Rolm   TOTAL HIP ARTHROPLASTY Right 11/12/2018    Procedure: TOTAL HIP  ARTHROPLASTY ANTERIOR APPROACH;  Surgeon: Leora Lynwood SAUNDERS, MD;  Location: [facility];  Service: Orthopedics;  Laterality: Right;        Social History         Socioeconomic History   Marital status: Divorced      Spouse name: Not on file   Number of children: 3   Years of education: Not on file   Highest education level: Associate degree: occupational, scientist, product/process development, or vocational program  Occupational History   Not on file  Tobacco Use   Smoking status: Never   Smokeless tobacco: Never  Vaping Use   Vaping status: Never Used  Substance and Sexual Activity   Alcohol use: Yes      Alcohol/week: 0.0 standard drinks of alcohol      Comment: occasionally   Drug use: No   Sexual activity: Yes      Birth control/protection: Surgical      Comment: Hysterectomy  Other Topics Concern   Not on file  Social History Narrative   Not on file    Social Drivers of Health        Financial Resource Strain: Low Risk  (10/19/2023)  Overall Financial Resource Strain (CARDIA)     Difficulty of Paying Living Expenses: Not very hard  Food Insecurity: Patient Declined (10/19/2023)    Hunger Vital Sign     Worried About Running Out of Food in the Last Year: Patient declined     Ran Out of Food in the Last Year: Patient declined  Transportation Needs: No Transportation Needs (10/19/2023)    PRAPARE - Therapist, Art (Medical): No     Lack of Transportation (Non-Medical): No  Physical Activity: Inactive (10/19/2023)    Exercise Vital Sign     Days of Exercise per Week: 0 days     Minutes of Exercise per Session: Not on file  Stress: Stress Concern Present (10/19/2023)    [employer] of Occupational Health - Occupational Stress Questionnaire     Feeling of Stress: Rather much  Social Connections: Socially Isolated (10/19/2023)    Social Connection and Isolation Panel     Frequency of Communication with Friends and Family: More than three times a week     Frequency of  Social Gatherings with Friends and Family: Three times a week     Attends Religious Services: Patient declined     Active Member of Clubs or Organizations: No     Attends Engineer, Structural: Not on file     Marital Status: Divorced         Family History  Problem Relation Age of Onset   Depression Mother     Bipolar disorder Brother     Colon cancer Maternal Grandfather          not sure of age   Breast cancer Neg Hx          Allergies       Allergies  Allergen Reactions   Celecoxib Hives   Prednisone         intolerance, makes her angry. Makes her angry   Topamax  [Topiramate ]        Mood changes   Zolpidem        Mood changes   Codeine Rash   Penicillins Rash      Did it involve swelling of the face/tongue/throat, SOB, or low BP? No Did it involve sudden or severe rash/hives, skin peeling, or any reaction on the inside of your mouth or nose? Yes Did you need to seek medical attention at a hospital or doctor's office? Yes When did it last happen? More than 20 years ago       If all above answers are "NO", may proceed with cephalosporin use.              Current Outpatient Medications  Medication Sig Dispense Refill   acetaminophen  (TYLENOL ) 500 MG tablet Take 1 tablet (500 mg total) by mouth every 8 (eight) hours for 10 days. 30 tablet 0   aspirin  EC 325 MG tablet Take 1 tablet (325 mg total) by mouth daily. 14 tablet 0   ibuprofen  (ADVIL ) 800 MG tablet Take 1 tablet (800 mg total) by mouth every 8 (eight) hours for 10 days. Please take with food, please alternate with acetaminophen  30 tablet 0   oxyCODONE  (ROXICODONE ) 5 MG immediate release tablet Take 1 tablet (5 mg total) by mouth every 4 (four) hours as needed for severe pain (pain score 7-10) or breakthrough pain. 15 tablet 0   albuterol  (PROVENTIL ) (2.5 MG/3ML) 0.083% nebulizer solution Take 3 mLs (2.5 mg total) by nebulization every 6 (six) hours as  needed for wheezing or shortness of breath 75 mL 0    Albuterol -Budesonide  (AIRSUPRA ) 90-80 MCG/ACT AERO Inhale 1-2 puffs into the lungs as needed (wheezing). 10.7 g 5   desvenlafaxine  (PRISTIQ ) 50 MG 24 hr tablet Take 1 tablet (50 mg total) by mouth daily. 90 tablet 1   diclofenac  Sodium (VOLTAREN ) 1 % GEL Apply 2 grams topically 4 (four) times daily. 100 g 1   famotidine  (PEPCID ) 40 MG tablet Take 1 tablet (40 mg total) by mouth daily. 90 tablet 1   Fluticasone -Umeclidin-Vilant (TRELEGY ELLIPTA ) 200-62.5-25 MCG/ACT AEPB Inhale 1 puff into the lungs daily. (Patient taking differently: Inhale 1 puff into the lungs at bedtime.) 60 each 6   gabapentin  (NEURONTIN ) 300 MG capsule Take 3 capsules (900 mg total) by mouth at bedtime. 270 capsule 1   Galcanezumab -gnlm (EMGALITY ) 120 MG/ML SOAJ Inject 1 mL into the skin every 30 (thirty) days. 1 mL 5   lidocaine  (LIDODERM ) 5 % Place 2 patches onto the skin daily. Remove & Discard patch within 12 hours or as directed by MD 60 patch 2   pantoprazole  (PROTONIX ) 40 MG tablet Take 1 tablet (40 mg total) by mouth daily. (Patient taking differently: Take 40 mg by mouth daily as needed.) 30 tablet 1   prazosin  (MINIPRESS ) 2 MG capsule Take 1 capsule (2 mg total) by mouth at bedtime. 90 capsule 1      No current facility-administered medications for this visit.       Imaging Results (Last 48 hours)  MR HIP RIGHT WO CONTRAST Result Date: 10/25/2023 EXAM DESCRIPTION: MR HIP RIGHT WO CONTRAST CLINICAL HISTORY: Hip pain, chronic, tendon/bursal abnormality suspected, xray done COMPARISON: None Available. TECHNIQUE: MRI of the hip is performed according to our usual protocol with multiplanar multi sequence imaging. FINDINGS: Right hip arthroplasty in place. Alignment unremarkable. No fracture or dislocation. The hardware appears intact. The visualized marrow signal is unremarkable. The sacroiliac joints are unremarkable. No significant joint effusion or bursal fluid appreciated. The visualized tendons and musculature are  unremarkable. No subcutaneous edema. IMPRESSION: Unremarkable right hip arthroplasty by MRI. Further assessment with bone scan if clinically warranted. Electronically signed by: Reyes Frees MD 10/25/2023 11:41 AM EDT RP Workstation: [id number]    MR Shoulder Right Wo Contrast Result Date: 10/25/2023 EXAM DESCRIPTION: MR SHOULDER RIGHT WO CONTRAST CLINICAL HISTORY: 53 years, Female, Shoulder pain, chronic, rotator cuff disorder suspected, xray done COMPARISON: None TECHNIQUE: MRI of the shoulder was performed with multiplanar multi sequence imaging according to our usual protocol. FINDINGS: Prior rotator cuff surgery. There is a recurrent full-thickness tear of the supraspinatus tendon with most fibers retracted at least 2 cm. Mild muscle belly atrophy. Mild bursal fluid. Mild to moderate insertional tendinopathy of the infraspinatus tendon. The subscapularis and teres minor tendons are unremarkable. No significant joint effusion. No active labral tear. There is a partial tear to the intra-articular biceps tendon as well as within the proximal bicipital groove with thinned fibers. Mild acromioclavicular osteoarthritis. IMPRESSION: Recurrent retracted full-thickness supraspinatus tendon tear. Mild muscle belly atrophy. Partial tear of the proximal biceps tendon. I believe there are intact attenuated fibers. Mild acromioclavicular osteoarthritis. Electronically signed by: Reyes Frees MD 10/25/2023 09:10 AM EDT RP Workstation: [id number]         Review of Systems:   A ROS was performed including pertinent positives and negatives as documented in the HPI.     Musculoskeletal Exam:     There were no vitals taken for this visit.   Right shoulder incisions  are well-appearing without erythema or drainage.  Passive range of motion as well as active range of motion is to 135 degrees in the standing position against gravity.  External rotation at side is 30 degrees 2+ radial pulse.  Distal neurosensory  exam is intact.  Sling is well-fitting after adjustment   Right hip with positive Trendelenburg gait and pain over the trochanter with pain with resisted abduction of the right leg.  There is 3 degrees internal/external rotation without pain.  Distal neurosensory exam is intact   Imaging:     MRI right shoulder: There does appear a right shoulder recurrent tear of the supraspinatus   Right hip MRI: There is a near full-thickness tear of the gluteus medius and minimus with evidence of chronic atrophy on the T1 view   I personally reviewed and interpreted the radiographs.     Assessment:   Status post right shoulder rotator cuff repair with collagen patch augmentation unfortunately with evidence of a recurrent tear of the right shoulder.  We did discuss treatment options and she would like to defer treatment of this today as her right hip has become more problematic.  I did discuss that her MRI does show evidence of full-thickness gluteus medius and minimus tear with significant muscle atrophy status post right total hip arthroplasty.  At this time she has weakness and pain.  She has tried physical therapy as well as injections in the past without relief.  Given this I did discuss surgical options including gluteus maximus tendon transfer which I do believe would be ideal given the chronicity and multiple injections that she has had in the past.  At this time she would like to pursue this and we will proceed with this   -Plan for right hip gluteus maximus tendon transfer     After a lengthy discussion of treatment options, including risks, benefits, alternatives, complications of surgical and nonsurgical conservative options, the patient elected surgical repair.    The patient  is aware of the material risks  and complications including, but not limited to injury to adjacent structures, neurovascular injury, infection, numbness, bleeding, implant failure, thermal burns, stiffness, persistent  pain, failure to heal, disease transmission from allograft, need for further surgery, dislocation, anesthetic risks, blood clots, risks of death,and others. The probabilities of surgical success and failure discussed with patient given their particular co-morbidities.The time and nature of expected rehabilitation and recovery was discussed.The patient's questions were all answered preoperatively.  No barriers to understanding were noted. I explained the natural history of the disease process and Rx rationale.  I explained to the patient what I considered to be reasonable expectations given their personal situation.  The final treatment plan was arrived at through a shared patient decision making process model.       I personally saw and evaluated the patient, and participated in the management and treatment plan.   Elspeth Parker, MD Attending Physician, Orthopedic Surgery   This document was dictated using Dragon voice recognition software. A reasonable attempt at proof reading has been made to minimize errors.

## 2023-12-17 NOTE — Anesthesia Procedure Notes (Signed)
 Procedure Name: Intubation Date/Time: 12/17/2023 7:41 AM  Performed by: Leopoldo Wanda DASEN, CRNAPre-anesthesia Checklist: Patient identified, Emergency Drugs available, Suction available and Patient being monitored Patient Re-evaluated:Patient Re-evaluated prior to induction Oxygen Delivery Method: Circle System Utilized Preoxygenation: Pre-oxygenation with 100% oxygen Induction Type: IV induction Ventilation: Mask ventilation without difficulty Laryngoscope Size: Mac and 3 Grade View: Grade I Tube type: Oral Tube size: 6.5 mm Number of attempts: 1 Airway Equipment and Method: Stylet and Bite block Placement Confirmation: ETT inserted through vocal cords under direct vision, positive ETCO2 and breath sounds checked- equal and bilateral Secured at: 20 cm Tube secured with: Tape Dental Injury: Teeth and Oropharynx as per pre-operative assessment

## 2023-12-17 NOTE — Op Note (Signed)
 Date of Surgery: 12/17/2023  INDICATIONS: Shelby Henson is a 53 y.o.-year-old female with right hip gluteus medius tearing chronically.  The risk and benefits of the procedure were discussed in detail and documented in the pre-operative evaluation.   PREOPERATIVE DIAGNOSIS: 1. Right hip gluteus medius tear, chornic  POSTOPERATIVE DIAGNOSIS: Same.  PROCEDURE: 1. Right hip gluteus maximus tendon transfer with gluteus medius repair.  SURGEON: Elspeth LITTIE Parker MD  ASSISTANT: Conley Dawson, ATC  ANESTHESIA:  general  IV FLUIDS AND URINE: See anesthesia record.  ANTIBIOTICS: Ancef   ESTIMATED BLOOD LOSS: 10 mL.  IMPLANTS:  Implant Name Type Inv. Item Serial No. Manufacturer Lot No. LRB No. Used Action  ANCHOR SUT QUATTRO KNTLS 4.5 - [id number] Anchor ANCHOR SUT QUATTRO KNTLS 4.5  [employer](ORTH,TRAU,BIO,SG) [id number] Right 2 Implanted  ANCHOR JUGGERKNOT SOFT 2.9 - [id number] Anchor ANCHOR JUGGERKNOT SOFT 2.9  [employer](ORTH,TRAU,BIO,SG) [id number] Right 2 Implanted    DRAINS: None  CULTURES: None  COMPLICATIONS: none  DESCRIPTION OF PROCEDURE:  The patient was identified in the preoperative holding area.  Antibiotics were given 1 hour prior to skin incision.  Timeout was performed and surgical site was marked with nursing.  The patient was subsequently taken back to the operating room.  Anesthesia was induced. The patient was placed in the lateral decubitus position.  Axillary roll and down peroneal nerve was padded well   At this time I began with an approach to the lateral trochanter centered at the vastus ridge.  15 blade was used to incise skin.  This was done down to the level of the IT band.  Electrocautery was utilized to achieve hemostasis.  At this time the IT band was marked in such a way as to preserve an anterior flap.  This was incised completely with a of an abduction.  Gluteus medius was completely torn and found to be nonviable with significant fatty infiltration.  At  this time a flap was mobilized anteriorly from the gluteus maximus in a triangular fashion.  This mobilized well over to the trochanter. The trochanteric bursectomy was seen and excises with Metzenbaums.   A double row construct was utilized with 2 medial row all suture anchors.  These were brought up through the remaining gluteus medius tendon and into the flap.  2 of these were tied to provisionally stabilize the flap.  These were then brought up to the distal most aspect of the flap and brought into 2 lateral row anchors over the native footprint of the gluteus medius.  Given the quality of the tendon the decision was made to supplement with a collagen patch.  This was sutured in the corners into the native insertion of the gluteus medius.   This time the wound was thoroughly irrigated.  The posterior aspect of the IT band was sutured to the posterior limb of the flap.  This was done with 0 Vicryl.  This was done in an interrupted fashion.  The wound was again irrigated and closed in layers of 0 Vicryl, 2-0 Vicryl and 3-0 nylon.  Xeroform, gauze, Aquacel dressing were applied.   Instrument, sponge, and needle counts were correct prior to wound closure and at the conclusion of the case.  The patient was taken to the PACU without complication         POSTOPERATIVE PLAN: The patient will be weightbearing as tolerated for total of 2 weeks.  She will be placed on Aspirin  for blood clot prevention.  I will see the patient back in  2 weeks.     Elspeth LITTIE Parker, MD 9:00 AM

## 2023-12-18 ENCOUNTER — Encounter (HOSPITAL_BASED_OUTPATIENT_CLINIC_OR_DEPARTMENT_OTHER): Payer: Self-pay | Admitting: Orthopaedic Surgery

## 2023-12-18 ENCOUNTER — Other Ambulatory Visit (HOSPITAL_BASED_OUTPATIENT_CLINIC_OR_DEPARTMENT_OTHER): Payer: Self-pay | Admitting: Orthopaedic Surgery

## 2023-12-18 DIAGNOSIS — M67951 Unspecified disorder of synovium and tendon, right thigh: Secondary | ICD-10-CM

## 2023-12-19 ENCOUNTER — Ambulatory Visit (HOSPITAL_BASED_OUTPATIENT_CLINIC_OR_DEPARTMENT_OTHER): Admitting: Physical Therapy

## 2023-12-23 ENCOUNTER — Ambulatory Visit

## 2023-12-24 ENCOUNTER — Encounter (HOSPITAL_BASED_OUTPATIENT_CLINIC_OR_DEPARTMENT_OTHER): Admitting: Physical Therapy

## 2023-12-24 ENCOUNTER — Other Ambulatory Visit (HOSPITAL_BASED_OUTPATIENT_CLINIC_OR_DEPARTMENT_OTHER): Payer: Self-pay | Admitting: Orthopaedic Surgery

## 2023-12-24 ENCOUNTER — Ambulatory Visit: Attending: Family Medicine

## 2023-12-24 ENCOUNTER — Other Ambulatory Visit: Payer: Self-pay | Admitting: Orthopaedic Surgery

## 2023-12-24 ENCOUNTER — Other Ambulatory Visit: Payer: Self-pay

## 2023-12-24 DIAGNOSIS — M25551 Pain in right hip: Secondary | ICD-10-CM | POA: Insufficient documentation

## 2023-12-24 DIAGNOSIS — R262 Difficulty in walking, not elsewhere classified: Secondary | ICD-10-CM | POA: Insufficient documentation

## 2023-12-24 DIAGNOSIS — R269 Unspecified abnormalities of gait and mobility: Secondary | ICD-10-CM | POA: Insufficient documentation

## 2023-12-24 DIAGNOSIS — R2689 Other abnormalities of gait and mobility: Secondary | ICD-10-CM | POA: Insufficient documentation

## 2023-12-24 DIAGNOSIS — M6281 Muscle weakness (generalized): Secondary | ICD-10-CM | POA: Diagnosis not present

## 2023-12-24 DIAGNOSIS — M67951 Unspecified disorder of synovium and tendon, right thigh: Secondary | ICD-10-CM | POA: Insufficient documentation

## 2023-12-24 MED ORDER — OXYCODONE HCL 5 MG PO TABS
5.0000 mg | ORAL_TABLET | ORAL | 0 refills | Status: DC | PRN
  Filled 2023-12-24: qty 15, 3d supply, fill #0

## 2023-12-24 MED ORDER — OXYCODONE HCL 5 MG PO TABS: 5.0000 mg | ORAL_TABLET | ORAL | 0 refills | Status: DC | PRN

## 2023-12-24 NOTE — Therapy (Signed)
 OUTPATIENT PHYSICAL THERAPY LOWER EXTREMITY EVALUATION   Patient Name: Shelby Henson MRN: [id number] DOB:Jul 05, 1970, 53 y.o., female Today's Date: 12/25/2023  END OF SESSION:  PT End of Session - 12/24/23 2200     Visit Number 1    Number of Visits 24    Date for Recertification  02/05/24    Progress Note Due on Visit 10    PT Start Time 1530    PT Stop Time 1620    PT Time Calculation (min) 50 min    Equipment Utilized During Treatment Gait belt    Activity Tolerance Patient tolerated treatment well    Behavior During Therapy WFL for tasks assessed/performed          Past Medical History:  Diagnosis Date   Anemia    Anxiety    Asthma    well controlled   Colon polyps    Complication of anesthesia    DDD (degenerative disc disease), lumbar    Depression    GERD (gastroesophageal reflux disease)    occ   Iron  deficiency anemia    Migraine    Morbid obesity (HCC)    Osteoarthritis    Osteoarthritis of right hip    PONV (postoperative nausea and vomiting)    with c-section x 1   Right rotator cuff tear 11/2022   Vitamin D  deficiency    Past Surgical History:  Procedure Laterality Date   BREAST CYST ASPIRATION Left    CESAREAN SECTION  1990   CESAREAN SECTION  1993   CESAREAN SECTION  1996   COLONOSCOPY N/A 09/22/2020   Procedure: COLONOSCOPY;  Surgeon: Janalyn Keene NOVAK, MD;  Location: [facility];  Service: Endoscopy;  Laterality: N/A;   LAPAROSCOPY  2018   LEEP  2005   benign pathology; Dr. Rolm   OPEN SURGICAL REPAIR OF GLUTEAL TENDON Right 12/17/2023   Procedure: REPAIR, TENDON, GLUTEUS MEDIUS, OPEN;  Surgeon: Genelle Standing, MD;  Location: [facility];  Service: Orthopedics;  Laterality: Right;  RIGHT GLUTEUS MAXIMUS TENDON TRANSFER   SHOULDER ACROMIOPLASTY Right 01/03/2023   Procedure: RIGHT SHOULDER ACROMIOPLASTY;  Surgeon: Genelle Standing, MD;  Location: [facility];  Service: Orthopedics;  Laterality: Right;   SHOULDER  ARTHROSCOPY WITH ROTATOR CUFF REPAIR AND OPEN BICEPS TENODESIS Right 01/03/2023   Procedure: RIGHT SHOULDER ARTHROSCOPY WITH ROTATOR CUFF REPAIR AND  BICEPS TENODESIS;  Surgeon: Genelle Standing, MD;  Location: [facility];  Service: Orthopedics;  Laterality: Right;   SUPRACERVICAL ABDOMINAL HYSTERECTOMY  2010   leio/AUB; Dr. Rolm   TOTAL HIP ARTHROPLASTY Right 11/12/2018   Procedure: TOTAL HIP ARTHROPLASTY ANTERIOR APPROACH;  Surgeon: Leora Lynwood SAUNDERS, MD;  Location: [facility];  Service: Orthopedics;  Laterality: Right;   Patient Active Problem List   Diagnosis Date Noted   Tendinopathy of right gluteus medius 12/17/2023   Severe persistent asthma without complication (HCC) 03/29/2023   Nontraumatic incomplete tear of right rotator cuff 01/03/2023   Right rotator cuff tendonitis 09/11/2022   Iron  deficiency anemia due to dietary causes 08/10/2022   Chronic right shoulder pain 05/11/2022   Morbid obesity (HCC) 01/05/2022   Rotator cuff impingement syndrome, right 09/29/2021   Biceps tendinitis, right 09/29/2021   Meralgia paresthetica of right side 03/29/2021   Psoas tendinitis, right hip 03/29/2021   History of total right hip arthroplasty 03/29/2021   Status post total replacement of right hip 02/19/2021   Meralgia paresthetica of right side 02/19/2021   Special screening for malignant neoplasms, colon    Polyp  of ascending colon    Polyp of sigmoid colon    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 03/23/2020   Pain in right hip 01/12/2019   Vitamin D  deficiency 02/25/2018   Chronic pain 10/24/2015   Hot flashes 10/24/2015   Migraine without aura and without status migrainosus, not intractable 10/24/2015   Metabolic syndrome 10/24/2015   Dyslipidemia 10/24/2015   Allergic rhinitis, seasonal 05/17/2015   Chronic insomnia 08/08/2014   DDD (degenerative disc disease), lumbar 08/08/2014   Mood disorder 08/08/2014   Gastroesophageal reflux disease without  esophagitis 08/08/2014   H/O total hysterectomy 08/08/2014   Irritable bowel syndrome with diarrhea 08/08/2014   Osteoarthritis of right hip 08/08/2014   Adult BMI 30+ 08/08/2014   SBO (spina bifida occulta) 08/08/2014    PCP: Dr. Dorette Loron  REFERRING PROVIDER: Dr. Elspeth Parker  REFERRING DIAG: Right hip gluteus medius tear, s/p Right hip gluteus maximus tendon transfer with gluteus medius repair.   THERAPY DIAG:  Muscle weakness (generalized) - Plan: PT plan of care cert/re-cert  Pain in right hip - Plan: PT plan of care cert/re-cert  Abnormality of gait and mobility - Plan: PT plan of care cert/re-cert  Difficulty in walking, not elsewhere classified - Plan: PT plan of care cert/re-cert  Imbalance - Plan: PT plan of care cert/re-cert  Rationale for Evaluation and Treatment: Rehabilitation  ONSET DATE: Surgery on 12/17/2023  SUBJECTIVE:   SUBJECTIVE STATEMENT: Patient reports pain limited and trying to be careful. Reports already using a cane and didn't use the walker much at all. States bandage is intact and leg is sore.   PERTINENT HISTORY: Per Medical record from 10/25/2023 ortho surgery note:  I did discuss that her MRI does show evidence of full-thickness gluteus medius and minimus tear with significant muscle atrophy status post right total hip arthroplasty.  At this time she has weakness and pain.  She has tried physical therapy as well as injections in the past without relief.  Given this I did discuss surgical options including gluteus maximus tendon transfer which I do believe would be ideal given the chronicity and multiple injections that she has had in the past.  At this time she would like to pursue this and we will proceed with this  -Plan for right hip gluteus maximus tendon transfer PMH- See above section- Previous THR Right hip in 2020.    PAIN:  Are you having pain? Yes: NPRS scale: 7/10 R lateral hip pain Pain location:   Pain description: achy-  constant Aggravating factors: Walking, prolonged sitting. Standing Relieving factors: rest, meds, Ice- polar care  PRECAUTIONS: Other: per protocol for Dr. Parker- avoid until 6 weeks: passive adduction and ER, Active Abduction and IR  RED FLAGS: None   WEIGHT BEARING RESTRICTIONS: Yes RLE Weight bearing as tolerated  FALLS:  Has patient fallen in last 6 months? No  LIVING ENVIRONMENT: Lives with: lives with their family Lives in: Mobile home Stairs: Yes: External: 5 steps; can reach both Has following equipment at home: Single point cane and Walker - 2 wheeled  OCCUPATION: front office   PLOF: Independent  PATIENT GOALS: Stop limping, no pain, walk better.  NEXT MD VISIT: Wednesday 01/01/2024 - Dr. Parker  OBJECTIVE:  Note: Objective measures were completed at Evaluation unless otherwise noted.  DIAGNOSTIC FINDINGS:  Right hip MRI: There is a near full-thickness tear of the gluteus medius and minimus with evidence of chronic atrophy on the T1 view  PATIENT SURVEYS:  LEFS  Extreme difficulty/unable (0), Quite a bit of difficulty (1), Moderate difficulty (2), Little difficulty (3), No difficulty (4) Survey date:   12/24/23  Any of your usual work, housework or school activities 0  2. Usual hobbies, recreational or sporting activities 0  3. Getting into/out of the bath 2  4. Walking between rooms 2  5. Putting on socks/shoes 1  6. Squatting  0  7. Lifting an object, like a bag of groceries from the floor 1  8. Performing light activities around your home 1  9. Performing heavy activities around your home 0  10. Getting into/out of a car 1  11. Walking 2 blocks 0  12. Walking 1 mile 0  13. Going up/down 10 stairs (1 flight) 1  14. Standing for 1 hour 0  15.  sitting for 1 hour 1  16. Running on even ground 0  17. Running on uneven ground 0  18. Making sharp turns while running fast 0  19. Hopping  0  20. Rolling over in bed 1  Score total:  11/80      COGNITION: Overall cognitive status: Within functional limits for tasks assessed     SENSATION: Light touch: WFL in both LE   EDEMA:  Observed swelling R hip vs. Left - did not measure today- Educated on use of ice as needed for swelling/pain.   MUSCLE LENGTH:   POSTURE: No Significant postural limitations  PALPATION: (+) tenderness- R lateral hip region along incision and general lateral aspect of upper leg.   LOWER EXTREMITY ROM:  Active ROM Right eval Left eval  Hip flexion Restricted to 90 deg   Hip extension    Hip abduction No active ABD active until week 6   Hip adduction No Passive ADD until week 6   Hip internal rotation No active IR until week 6   Hip external rotation No passive ER until week 6   Knee flexion    Knee extension    Ankle dorsiflexion    Ankle plantarflexion    Ankle inversion    Ankle eversion     (Blank rows = not tested)  LOWER EXTREMITY MMT:  MMT Right eval Left eval  Hip flexion 2- 5  Hip extension Isometric today 5  Hip abduction NT- due to precaution 5  Hip adduction NT 5  Hip internal rotation NT 5  Hip external rotation NT 5  Knee flexion 3+ 5  Knee extension 2- 5  Ankle dorsiflexion 4 5  Ankle plantarflexion    Ankle inversion    Ankle eversion     (Blank rows = not tested)  LOWER EXTREMITY SPECIAL TESTS:  N/A  FUNCTIONAL TESTS:  10 meter walk test: 0.42 m/s with SPC on Left side  GAIT: Distance walked: approx 80 feet Assistive device utilized: Single point cane Level of assistance: SBA Comments: Patient initially using cane on R side. Educated on trying to use on Left side to off load some weight on RLE. Patient with significant less limping  after switching. She exhibited short step to gait, antalgic on Right with decreased step length and foot clearance.  TREATMENT DATE:  PT  Evaluation Education in protocol for Gluteus Medius Repair for Dr. Eugene details including guidelines, goals, Precautions, Appropriate activities and gait training   Gait with cane - measured height of cane for fitting and instructed to use on Left side Inspected Right hip- bandage intact with no sign of infection- no drainage, redness, excessive warmth to touch or odor- just swelling.  Instructed in the following exercises and provided HEP today (as below) 1-2 sets of 10 today   PATIENT EDUCATION:  Education details: PT plan of care; Specific details of Rehab protocol specific for Dr. Genelle; gait techniques and use of cane, Weight bearing and other precautions and specific exercises as detailed in protocol. Person educated: Patient and dtr Education method: Explanation, Demonstration, Tactile cues, Verbal cues, and Handouts Education comprehension: verbalized understanding, returned demonstration, verbal cues required, tactile cues required, and needs further education  HOME EXERCISE PROGRAM: Access Code: [email or web address] URL: [email or web address] Date: 12/25/2023 Prepared by: Reyes London  Exercises - Supine Hip Abduction PROM with Caregiver   - 1 x daily - 3 sets - 10 reps - Supine Hip and Knee Flexion PROM with Caregiver  - 1 x daily - 3 sets - 10 reps - Supine Single Leg Hip Extension Isometric Into Table  - 1 x daily - 3 sets - 10 reps - 3-5 hold - Supine Isometric Hip Adduction with Pillow at Knees  - 1 x daily - 3 sets - 10 reps - 3-5 sec hold - Supine Isometric Hamstring Set  - 1 x daily - 3 sets - 10 reps - Supine Posterior Pelvic Tilt  - 1 x daily - 3 sets - 10 reps  ASSESSMENT:  CLINICAL IMPRESSION: Patient is a 53 y.o. female who was seen today for physical therapy evaluation and treatment for Right gluteus medius tear- s/p Right hip gluteus maximus tendon transfer with gluteus medius repair. She is motivated yet very pain limited along with specific  precautions per protocol. She presents with healing Right hip with bandage intact, R LE muscle weakness and difficulty walking- currently using a cane for assist. She will benefit from skilled PT services to address her healing, pain limited mobility, muscle weakness and impaired balance to help enable her to return to her previous level of function.   OBJECTIVE IMPAIRMENTS: Abnormal gait, decreased activity tolerance, decreased balance, decreased coordination, decreased endurance, decreased knowledge of use of DME, decreased mobility, difficulty walking, decreased ROM, decreased strength, increased edema, impaired flexibility, and pain.   ACTIVITY LIMITATIONS: carrying, lifting, bending, sitting, standing, squatting, sleeping, stairs, transfers, and bed mobility  PARTICIPATION LIMITATIONS: meal prep, cleaning, laundry, driving, shopping, community activity, occupation, yard work, and church  PERSONAL FACTORS: 1 comorbidity: chronic pain are also affecting patient's functional outcome.   REHAB POTENTIAL: Good  CLINICAL DECISION MAKING: Stable/uncomplicated  EVALUATION COMPLEXITY: Low   GOALS: Goals reviewed with patient? Yes  SHORT TERM GOALS: Target date: 02/04/2024  Pt will be independent with HEP in order to decrease ankle pain and increase strength in order to improve pain-free function at home and work.  Baseline: EVAL- NO FORMAL HEP IN PLACE Goal status: INITIAL  2.  Patient will adhere to all prescribed precautions set by MD to reduce risk of re-injury or re-hospitalization.  Baseline: EVAL- Patient in learning phase of set precuations. Goal status: INITIAL    LONG TERM GOALS: Target date: 03/17/2024  Pt will increase LEFS by at least 30 points in order to demonstrate significant improvement in lower extremity function.  Baseline: EVAL = 11/80 Goal status: INITIAL  2.  Pt will decrease worst pain as reported on NPRS by at least 3 points in order to demonstrate clinically  significant reduction in ankle/foot pain.  Baseline: EVAL= 10/10 at worst R hip Goal status: INITIAL  3.  Patient will present with normalized gait without an AD for short community and all household distances. Baseline: EVAL- Antalgic gait RLE with use of cane Goal status: INITIAL  4.  Pt will increase strength of R Hip  to within 1/2 MMT grade of uninvolved LE in order to demonstrate improvement in strength and function  Baseline: EVAL (See MMT chart)  Goal status: INITIAL  5.  Patient will perform SLS test on RLE > 10 sec  Baseline: EVAL - Not appropriate with precautions at this time. Goal status: INITIAL    PLAN:  PT FREQUENCY: 1-2x/week  PT DURATION: 12 weeks  PLANNED INTERVENTIONS: [id number]- PT Re-evaluation, 97750- Physical Performance Testing, 97110-Therapeutic exercises, 97530- Therapeutic activity, [id number]- Neuromuscular re-education, 97535- Self Care, [id number]- Manual therapy, [id number]- Gait training, [phone]- Orthotic Initial, [phone]- Orthotic/Prosthetic subsequent, [phone]- Canalith repositioning, [phone]- Aquatic Therapy, [phone]- Electrical stimulation (manual), [phone] (1-2 muscles), [phone] (3+ muscles)- Dry Needling, Patient/Family education, Balance training, Stair training, Taping, Joint mobilization, Scar mobilization, Compression bandaging, Vestibular training, DME instructions, Cryotherapy, and Moist heat  PLAN FOR NEXT SESSION:  Review activities appropriate for week 0-4 in protocol Gait training with least restrictive AD   Reyes LOISE London, PT 12/25/2023, 5:32 PM

## 2023-12-31 ENCOUNTER — Encounter

## 2024-01-01 ENCOUNTER — Other Ambulatory Visit (HOSPITAL_BASED_OUTPATIENT_CLINIC_OR_DEPARTMENT_OTHER): Payer: Self-pay

## 2024-01-01 ENCOUNTER — Ambulatory Visit (INDEPENDENT_AMBULATORY_CARE_PROVIDER_SITE_OTHER): Admitting: Orthopaedic Surgery

## 2024-01-01 ENCOUNTER — Other Ambulatory Visit: Payer: Self-pay

## 2024-01-01 ENCOUNTER — Ambulatory Visit: Attending: Family Medicine

## 2024-01-01 ENCOUNTER — Other Ambulatory Visit (HOSPITAL_BASED_OUTPATIENT_CLINIC_OR_DEPARTMENT_OTHER): Payer: Self-pay | Admitting: Orthopaedic Surgery

## 2024-01-01 ENCOUNTER — Encounter (HOSPITAL_BASED_OUTPATIENT_CLINIC_OR_DEPARTMENT_OTHER): Payer: Self-pay | Admitting: Orthopaedic Surgery

## 2024-01-01 ENCOUNTER — Encounter (HOSPITAL_BASED_OUTPATIENT_CLINIC_OR_DEPARTMENT_OTHER): Admitting: Physical Therapy

## 2024-01-01 DIAGNOSIS — M67951 Unspecified disorder of synovium and tendon, right thigh: Secondary | ICD-10-CM

## 2024-01-01 DIAGNOSIS — R269 Unspecified abnormalities of gait and mobility: Secondary | ICD-10-CM | POA: Insufficient documentation

## 2024-01-01 DIAGNOSIS — R2689 Other abnormalities of gait and mobility: Secondary | ICD-10-CM | POA: Insufficient documentation

## 2024-01-01 DIAGNOSIS — M25551 Pain in right hip: Secondary | ICD-10-CM | POA: Diagnosis present

## 2024-01-01 DIAGNOSIS — R262 Difficulty in walking, not elsewhere classified: Secondary | ICD-10-CM | POA: Insufficient documentation

## 2024-01-01 DIAGNOSIS — M6281 Muscle weakness (generalized): Secondary | ICD-10-CM | POA: Diagnosis present

## 2024-01-01 MED ORDER — OXYCODONE HCL 5 MG PO TABS
5.0000 mg | ORAL_TABLET | ORAL | 0 refills | Status: DC | PRN
  Filled 2024-01-01: qty 20, 4d supply, fill #0

## 2024-01-01 MED ORDER — OXYCODONE HCL 5 MG PO TABS
5.0000 mg | ORAL_TABLET | ORAL | 0 refills | Status: DC | PRN
  Filled 2024-01-01: qty 20, 4d supply, fill #0
  Filled ????-??-??: fill #0

## 2024-01-01 NOTE — Therapy (Signed)
 OUTPATIENT PHYSICAL THERAPY LOWER EXTREMITY TREATMENT   Patient Name: Shelby Henson MRN: [id number] DOB:1970-02-05, 53 y.o., female Today's Date: 01/02/2024  END OF SESSION:  PT End of Session - 01/01/24 1204     Visit Number 2    Number of Visits 24    Date for Recertification  02/05/24    Progress Note Due on Visit 10    PT Start Time 1151    PT Stop Time 1220    PT Time Calculation (min) 29 min    Equipment Utilized During Treatment Gait belt    Activity Tolerance Patient tolerated treatment well    Behavior During Therapy WFL for tasks assessed/performed          Past Medical History:  Diagnosis Date   Anemia    Anxiety    Asthma    well controlled   Colon polyps    Complication of anesthesia    DDD (degenerative disc disease), lumbar    Depression    GERD (gastroesophageal reflux disease)    occ   Iron  deficiency anemia    Migraine    Morbid obesity (HCC)    Osteoarthritis    Osteoarthritis of right hip    PONV (postoperative nausea and vomiting)    with c-section x 1   Right rotator cuff tear 11/2022   Vitamin D  deficiency    Past Surgical History:  Procedure Laterality Date   BREAST CYST ASPIRATION Left    CESAREAN SECTION  1990   CESAREAN SECTION  1993   CESAREAN SECTION  1996   COLONOSCOPY N/A 09/22/2020   Procedure: COLONOSCOPY;  Surgeon: Janalyn Keene NOVAK, MD;  Location: [facility];  Service: Endoscopy;  Laterality: N/A;   LAPAROSCOPY  2018   LEEP  2005   benign pathology; Dr. Rolm   OPEN SURGICAL REPAIR OF GLUTEAL TENDON Right 12/17/2023   Procedure: REPAIR, TENDON, GLUTEUS MEDIUS, OPEN;  Surgeon: Genelle Standing, MD;  Location: [facility];  Service: Orthopedics;  Laterality: Right;  RIGHT GLUTEUS MAXIMUS TENDON TRANSFER   SHOULDER ACROMIOPLASTY Right 01/03/2023   Procedure: RIGHT SHOULDER ACROMIOPLASTY;  Surgeon: Genelle Standing, MD;  Location: [facility];  Service: Orthopedics;  Laterality: Right;   SHOULDER ARTHROSCOPY  WITH ROTATOR CUFF REPAIR AND OPEN BICEPS TENODESIS Right 01/03/2023   Procedure: RIGHT SHOULDER ARTHROSCOPY WITH ROTATOR CUFF REPAIR AND  BICEPS TENODESIS;  Surgeon: Genelle Standing, MD;  Location: [facility];  Service: Orthopedics;  Laterality: Right;   SUPRACERVICAL ABDOMINAL HYSTERECTOMY  2010   leio/AUB; Dr. Rolm   TOTAL HIP ARTHROPLASTY Right 11/12/2018   Procedure: TOTAL HIP ARTHROPLASTY ANTERIOR APPROACH;  Surgeon: Leora Lynwood SAUNDERS, MD;  Location: [facility];  Service: Orthopedics;  Laterality: Right;   Patient Active Problem List   Diagnosis Date Noted   Tendinopathy of right gluteus medius 12/17/2023   Severe persistent asthma without complication (HCC) 03/29/2023   Nontraumatic incomplete tear of right rotator cuff 01/03/2023   Right rotator cuff tendonitis 09/11/2022   Iron  deficiency anemia due to dietary causes 08/10/2022   Chronic right shoulder pain 05/11/2022   Morbid obesity (HCC) 01/05/2022   Rotator cuff impingement syndrome, right 09/29/2021   Biceps tendinitis, right 09/29/2021   Meralgia paresthetica of right side 03/29/2021   Psoas tendinitis, right hip 03/29/2021   History of total right hip arthroplasty 03/29/2021   Status post total replacement of right hip 02/19/2021   Meralgia paresthetica of right side 02/19/2021   Special screening for malignant neoplasms, colon    Polyp  of ascending colon    Polyp of sigmoid colon    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 03/23/2020   Pain in right hip 01/12/2019   Vitamin D  deficiency 02/25/2018   Chronic pain 10/24/2015   Hot flashes 10/24/2015   Migraine without aura and without status migrainosus, not intractable 10/24/2015   Metabolic syndrome 10/24/2015   Dyslipidemia 10/24/2015   Allergic rhinitis, seasonal 05/17/2015   Chronic insomnia 08/08/2014   DDD (degenerative disc disease), lumbar 08/08/2014   Mood disorder 08/08/2014   Gastroesophageal reflux disease without esophagitis  08/08/2014   H/O total hysterectomy 08/08/2014   Irritable bowel syndrome with diarrhea 08/08/2014   Osteoarthritis of right hip 08/08/2014   Adult BMI 30+ 08/08/2014   SBO (spina bifida occulta) 08/08/2014    PCP: Dr. Dorette Loron  REFERRING PROVIDER: Dr. Elspeth Parker  REFERRING DIAG: Right hip gluteus medius tear, s/p Right hip gluteus maximus tendon transfer with gluteus medius repair.   THERAPY DIAG:  Muscle weakness (generalized)  Pain in right hip  Abnormality of gait and mobility  Difficulty in walking, not elsewhere classified  Imbalance  Rationale for Evaluation and Treatment: Rehabilitation  ONSET DATE: Surgery on 12/17/2023  SUBJECTIVE:   SUBJECTIVE STATEMENT: Patient reports just left MD office and had staples removed today. States MD is well pleased and she said she may not need much PT. States still swollen but feeling better overall and in talks with MD about possibly returning to work next week.   PERTINENT HISTORY: Per Medical record from 10/25/2023 ortho surgery note:  I did discuss that her MRI does show evidence of full-thickness gluteus medius and minimus tear with significant muscle atrophy status post right total hip arthroplasty.  At this time she has weakness and pain.  She has tried physical therapy as well as injections in the past without relief.  Given this I did discuss surgical options including gluteus maximus tendon transfer which I do believe would be ideal given the chronicity and multiple injections that she has had in the past.  At this time she would like to pursue this and we will proceed with this  -Plan for right hip gluteus maximus tendon transfer PMH- See above section- Previous THR Right hip in 2020.    PAIN:  Are you having pain? Yes: NPRS scale: 7/10 R lateral hip pain Pain location:   Pain description: achy- constant Aggravating factors: Walking, prolonged sitting. Standing Relieving factors: rest, meds, Ice- polar  care  PRECAUTIONS: Other: per protocol for Dr. Parker- avoid until 6 weeks: passive adduction and ER, Active Abduction and IR  RED FLAGS: None   WEIGHT BEARING RESTRICTIONS: Yes RLE Weight bearing as tolerated  FALLS:  Has patient fallen in last 6 months? No  LIVING ENVIRONMENT: Lives with: lives with their family Lives in: Mobile home Stairs: Yes: External: 5 steps; can reach both Has following equipment at home: Single point cane and Walker - 2 wheeled  OCCUPATION: front office   PLOF: Independent  PATIENT GOALS: Stop limping, no pain, walk better.  NEXT MD VISIT: Wednesday 01/01/2024 - Dr. Parker  OBJECTIVE:  Note: Objective measures were completed at Evaluation unless otherwise noted.  DIAGNOSTIC FINDINGS:  Right hip MRI: There is a near full-thickness tear of the gluteus medius and minimus with evidence of chronic atrophy on the T1 view  PATIENT SURVEYS:  LEFS  Extreme difficulty/unable (0), Quite a bit of difficulty (1), Moderate difficulty (2), Little difficulty (3), No difficulty (4) Survey  date:   12/24/23  Any of your usual work, housework or school activities 0  2. Usual hobbies, recreational or sporting activities 0  3. Getting into/out of the bath 2  4. Walking between rooms 2  5. Putting on socks/shoes 1  6. Squatting  0  7. Lifting an object, like a bag of groceries from the floor 1  8. Performing light activities around your home 1  9. Performing heavy activities around your home 0  10. Getting into/out of a car 1  11. Walking 2 blocks 0  12. Walking 1 mile 0  13. Going up/down 10 stairs (1 flight) 1  14. Standing for 1 hour 0  15.  sitting for 1 hour 1  16. Running on even ground 0  17. Running on uneven ground 0  18. Making sharp turns while running fast 0  19. Hopping  0  20. Rolling over in bed 1  Score total:  11/80     COGNITION: Overall cognitive status: Within functional limits for tasks assessed     SENSATION: Light touch: WFL  in both LE   EDEMA:  Observed swelling R hip vs. Left - did not measure today- Educated on use of ice as needed for swelling/pain.   MUSCLE LENGTH:   POSTURE: No Significant postural limitations  PALPATION: (+) tenderness- R lateral hip region along incision and general lateral aspect of upper leg.   LOWER EXTREMITY ROM:  Active ROM Right eval Left eval  Hip flexion Restricted to 90 deg   Hip extension    Hip abduction No active ABD active until week 6   Hip adduction No Passive ADD until week 6   Hip internal rotation No active IR until week 6   Hip external rotation No passive ER until week 6   Knee flexion    Knee extension    Ankle dorsiflexion    Ankle plantarflexion    Ankle inversion    Ankle eversion     (Blank rows = not tested)  LOWER EXTREMITY MMT:  MMT Right eval Left eval  Hip flexion 2- 5  Hip extension Isometric today 5  Hip abduction NT- due to precaution 5  Hip adduction NT 5  Hip internal rotation NT 5  Hip external rotation NT 5  Knee flexion 3+ 5  Knee extension 2- 5  Ankle dorsiflexion 4 5  Ankle plantarflexion    Ankle inversion    Ankle eversion     (Blank rows = not tested)  LOWER EXTREMITY SPECIAL TESTS:  N/A  FUNCTIONAL TESTS:  10 meter walk test: 0.42 m/s with SPC on Left side  GAIT: Distance walked: approx 80 feet Assistive device utilized: Single point cane Level of assistance: SBA Comments: Patient initially using cane on R side. Educated on trying to use on Left side to off load some weight on RLE. Patient with significant less limping  after switching. She exhibited short step to gait, antalgic on Right with decreased step length and foot clearance.  TREATMENT DATE:  *Review in  protocol for Gluteus Medius Repair for Dr. Eugene details including guidelines, goals, precautions, appropriate  activities.   *Inspected Right hip-Steri- strips intact with no sign of infection- no drainage, redness, excessive warmth to touch or odor- just swelling.   Therex:   PROM R hip <90 deg 2 x 20 reps PROM R hip abd (to tolerance) 2 x 20 reps Isometric R  hip ext (hold 5 sec) 2 x 10 reps Isometric BLE Hip add (squeeze ball) hold 5 sec 2 x 10 reps Isometric R Hip ER (hold 5 sec x 10)   PATIENT EDUCATION:  Education details: PT plan of care; Specific details of Rehab protocol specific for Dr. Genelle; gait techniques and use of cane, Weight bearing and other precautions and specific exercises as detailed in protocol. Person educated: Patient and dtr Education method: Explanation, Demonstration, Tactile cues, Verbal cues, and Handouts Education comprehension: verbalized understanding, returned demonstration, verbal cues required, tactile cues required, and needs further education  HOME EXERCISE PROGRAM: Access Code: [email or web address] URL: [email or web address] Date: 12/25/2023 Prepared by: Reyes London  Exercises - Supine Hip Abduction PROM with Caregiver   - 1 x daily - 3 sets - 10 reps - Supine Hip and Knee Flexion PROM with Caregiver  - 1 x daily - 3 sets - 10 reps - Supine Single Leg Hip Extension Isometric Into Table  - 1 x daily - 3 sets - 10 reps - 3-5 hold - Supine Isometric Hip Adduction with Pillow at Knees  - 1 x daily - 3 sets - 10 reps - 3-5 sec hold - Supine Isometric Hamstring Set  - 1 x daily - 3 sets - 10 reps - Supine Posterior Pelvic Tilt  - 1 x daily - 3 sets - 10 reps  ASSESSMENT:  CLINICAL IMPRESSION: Patient is a 53 y.o. female who was seen today for physical therapy treatment for Right gluteus medius tear- s/p Right hip gluteus maximus tendon transfer with gluteus medius repair. She presents with good working knowledge of HEP at this time as well as precautions. Reviewed approved HEP today and patient performed safely and will continue until progressed  to phase two. She will benefit from skilled PT services to address her healing, pain limited mobility, muscle weakness and impaired balance to help enable her to return to her previous level of function.   OBJECTIVE IMPAIRMENTS: Abnormal gait, decreased activity tolerance, decreased balance, decreased coordination, decreased endurance, decreased knowledge of use of DME, decreased mobility, difficulty walking, decreased ROM, decreased strength, increased edema, impaired flexibility, and pain.   ACTIVITY LIMITATIONS: carrying, lifting, bending, sitting, standing, squatting, sleeping, stairs, transfers, and bed mobility  PARTICIPATION LIMITATIONS: meal prep, cleaning, laundry, driving, shopping, community activity, occupation, yard work, and church  PERSONAL FACTORS: 1 comorbidity: chronic pain are also affecting patient's functional outcome.   REHAB POTENTIAL: Good  CLINICAL DECISION MAKING: Stable/uncomplicated  EVALUATION COMPLEXITY: Low   GOALS: Goals reviewed with patient? Yes  SHORT TERM GOALS: Target date: 02/04/2024  Pt will be independent with HEP in order to decrease ankle pain and increase strength in order to improve pain-free function at home and work.  Baseline: EVAL- NO FORMAL HEP IN PLACE Goal status: INITIAL  2.  Patient will adhere to all prescribed precautions set by MD to reduce risk of re-injury or re-hospitalization.  Baseline: EVAL- Patient in learning phase of set precuations. Goal status: INITIAL    LONG TERM GOALS: Target date: 03/17/2024  Pt will increase LEFS by at  least 30 points in order to demonstrate significant improvement in lower extremity function.   Baseline: EVAL = 11/80 Goal status: INITIAL  2.  Pt will decrease worst pain as reported on NPRS by at least 3 points in order to demonstrate clinically significant reduction in ankle/foot pain.  Baseline: EVAL= 10/10 at worst R hip Goal status: INITIAL  3.  Patient will present with normalized gait  without an AD for short community and all household distances. Baseline: EVAL- Antalgic gait RLE with use of cane Goal status: INITIAL  4.  Pt will increase strength of R Hip  to within 1/2 MMT grade of uninvolved LE in order to demonstrate improvement in strength and function  Baseline: EVAL (See MMT chart)  Goal status: INITIAL  5.  Patient will perform SLS test on RLE > 10 sec  Baseline: EVAL - Not appropriate with precautions at this time. Goal status: INITIAL    PLAN:  PT FREQUENCY: 1-2x/week  PT DURATION: 12 weeks  PLANNED INTERVENTIONS: 97164- PT Re-evaluation, 97750- Physical Performance Testing, 97110-Therapeutic exercises, 97530- Therapeutic activity, [id number]- Neuromuscular re-education, 97535- Self Care, [id number]- Manual therapy, [id number]- Gait training, [phone]- Orthotic Initial, [phone]- Orthotic/Prosthetic subsequent, [phone]- Canalith repositioning, [phone]- Aquatic Therapy, [phone]- Electrical stimulation (manual), [phone] (1-2 muscles), [phone] (3+ muscles)- Dry Needling, Patient/Family education, Balance training, Stair training, Taping, Joint mobilization, Scar mobilization, Compression bandaging, Vestibular training, DME instructions, Cryotherapy, and Moist heat  PLAN FOR NEXT SESSION:  May hold until reach week 4 then progress in to protocol weeks -4-6.   Gait training with least restrictive AD   Reyes LOISE London, PT 01/02/2024, 9:20 AM

## 2024-01-01 NOTE — Progress Notes (Signed)
 Post Operative Evaluation    Procedure/Date of Surgery: Right hip gluteus maximus tendon transfer 11/18  Interval History:    Presents 2 weeks status post the above procedure.  Overall she is doing well.  She is walking with a cane.  Only with a mildly antalgic gait   PMH/PSH/Family History/Social History/Meds/Allergies:    Past Medical History:  Diagnosis Date   Anemia    Anxiety    Asthma    well controlled   Colon polyps    Complication of anesthesia    DDD (degenerative disc disease), lumbar    Depression    GERD (gastroesophageal reflux disease)    occ   Iron  deficiency anemia    Migraine    Morbid obesity (HCC)    Osteoarthritis    Osteoarthritis of right hip    PONV (postoperative nausea and vomiting)    with c-section x 1   Right rotator cuff tear 11/2022   Vitamin D  deficiency    Past Surgical History:  Procedure Laterality Date   BREAST CYST ASPIRATION Left    CESAREAN SECTION  1990   CESAREAN SECTION  1993   CESAREAN SECTION  1996   COLONOSCOPY N/A 09/22/2020   Procedure: COLONOSCOPY;  Surgeon: Janalyn Keene NOVAK, MD;  Location: [facility];  Service: Endoscopy;  Laterality: N/A;   LAPAROSCOPY  2018   LEEP  2005   benign pathology; Dr. Rolm   OPEN SURGICAL REPAIR OF GLUTEAL TENDON Right 12/17/2023   Procedure: REPAIR, TENDON, GLUTEUS MEDIUS, OPEN;  Surgeon: Genelle Standing, MD;  Location: [facility];  Service: Orthopedics;  Laterality: Right;  RIGHT GLUTEUS MAXIMUS TENDON TRANSFER   SHOULDER ACROMIOPLASTY Right 01/03/2023   Procedure: RIGHT SHOULDER ACROMIOPLASTY;  Surgeon: Genelle Standing, MD;  Location: [facility];  Service: Orthopedics;  Laterality: Right;   SHOULDER ARTHROSCOPY WITH ROTATOR CUFF REPAIR AND OPEN BICEPS TENODESIS Right 01/03/2023   Procedure: RIGHT SHOULDER ARTHROSCOPY WITH ROTATOR CUFF REPAIR AND  BICEPS TENODESIS;  Surgeon: Genelle Standing, MD;  Location: [facility];  Service:  Orthopedics;  Laterality: Right;   SUPRACERVICAL ABDOMINAL HYSTERECTOMY  2010   leio/AUB; Dr. Rolm   TOTAL HIP ARTHROPLASTY Right 11/12/2018   Procedure: TOTAL HIP ARTHROPLASTY ANTERIOR APPROACH;  Surgeon: Leora Lynwood SAUNDERS, MD;  Location: [facility];  Service: Orthopedics;  Laterality: Right;   Social History   Socioeconomic History   Marital status: Divorced    Spouse name: Not on file   Number of children: 3   Years of education: Not on file   Highest education level: Associate degree: occupational, scientist, product/process development, or vocational program  Occupational History   Not on file  Tobacco Use   Smoking status: Never   Smokeless tobacco: Never  Vaping Use   Vaping status: Never Used  Substance and Sexual Activity   Alcohol use: Yes    Alcohol/week: 0.0 standard drinks of alcohol    Comment: occasionally   Drug use: No   Sexual activity: Yes    Birth control/protection: Surgical    Comment: Hysterectomy  Other Topics Concern   Not on file  Social History Narrative   Not on file   Social Drivers of Health   Financial Resource Strain: Low Risk  (10/19/2023)   Overall Financial Resource Strain (CARDIA)    Difficulty of Paying Living Expenses: Not very  hard  Food Insecurity: Patient Declined (10/19/2023)   Hunger Vital Sign    Worried About Running Out of Food in the Last Year: Patient declined    Ran Out of Food in the Last Year: Patient declined  Transportation Needs: No Transportation Needs (10/19/2023)   PRAPARE - Administrator, Civil Service (Medical): No    Lack of Transportation (Non-Medical): No  Physical Activity: Inactive (10/19/2023)   Exercise Vital Sign    Days of Exercise per Week: 0 days    Minutes of Exercise per Session: Not on file  Stress: Stress Concern Present (10/19/2023)   [employer] of Occupational Health - Occupational Stress Questionnaire    Feeling of Stress: Rather much  Social Connections: Socially Isolated (10/19/2023)   Social  Connection and Isolation Panel    Frequency of Communication with Friends and Family: More than three times a week    Frequency of Social Gatherings with Friends and Family: Three times a week    Attends Religious Services: Patient declined    Active Member of Clubs or Organizations: No    Attends Engineer, Structural: Not on file    Marital Status: Divorced   Family History  Problem Relation Age of Onset   Depression Mother    Bipolar disorder Brother    Colon cancer Maternal Grandfather        not sure of age   Breast cancer Neg Hx    Allergies  Allergen Reactions   Celecoxib Hives   Prednisone      intolerance, makes her angry. Makes her angry   Topamax  [Topiramate ]     Mood changes   Zolpidem     Mood changes   Codeine Rash   Penicillins Rash    TOLERATED CEFAZOLIN    Current Outpatient Medications  Medication Sig Dispense Refill   albuterol  (PROVENTIL ) (2.5 MG/3ML) 0.083% nebulizer solution Take 3 mLs (2.5 mg total) by nebulization every 6 (six) hours as needed for wheezing or shortness of breath 75 mL 0   Albuterol -Budesonide  (AIRSUPRA ) 90-80 MCG/ACT AERO Inhale 1-2 puffs into the lungs as needed (wheezing). 10.7 g 5   aspirin  EC 325 MG tablet Take 1 tablet (325 mg total) by mouth daily. 14 tablet 0   desvenlafaxine  (PRISTIQ ) 50 MG 24 hr tablet Take 1 tablet (50 mg total) by mouth daily. 90 tablet 1   diclofenac  Sodium (VOLTAREN ) 1 % GEL Apply 2 grams topically 4 (four) times daily. 100 g 1   famotidine  (PEPCID ) 40 MG tablet Take 1 tablet (40 mg total) by mouth daily. 90 tablet 1   Fluticasone -Umeclidin-Vilant (TRELEGY ELLIPTA ) 200-62.5-25 MCG/ACT AEPB Inhale 1 puff into the lungs daily. (Patient taking differently: Inhale 1 puff into the lungs at bedtime.) 60 each 6   gabapentin  (NEURONTIN ) 300 MG capsule Take 3 capsules (900 mg total) by mouth at bedtime. 270 capsule 1   Galcanezumab -gnlm (EMGALITY ) 120 MG/ML SOAJ Inject 1 mL into the skin every 30 (thirty)  days. 1 mL 5   lidocaine  (LIDODERM ) 5 % Place 2 patches onto the skin daily. Remove & Discard patch within 12 hours or as directed by MD 60 patch 2   LORazepam  (ATIVAN ) 1 MG tablet Take 1 tablet (1 mg total) by mouth every 8 (eight) hours. 2 tablet 0   oxyCODONE  (ROXICODONE ) 5 MG immediate release tablet Take 1 tablet (5 mg total) by mouth every 4 (four) hours as needed for severe pain (pain score 7-10) or breakthrough pain. 15 tablet 0  oxyCODONE  (ROXICODONE ) 5 MG immediate release tablet Take 1 tablet (5 mg total) by mouth every 4 (four) hours as needed for severe pain (pain score 7-10) or breakthrough pain. 15 tablet 0   pantoprazole  (PROTONIX ) 40 MG tablet Take 1 tablet (40 mg total) by mouth daily. (Patient taking differently: Take 40 mg by mouth daily as needed.) 30 tablet 1   prazosin  (MINIPRESS ) 2 MG capsule Take 1 capsule (2 mg total) by mouth at bedtime. 90 capsule 1   No current facility-administered medications for this visit.   No results found.  Review of Systems:   A ROS was performed including pertinent positives and negatives as documented in the HPI.   Musculoskeletal Exam:    There were no vitals taken for this visit.  Right hip incisions are well-appearing without erythema or drainage.  Walks with a nonantalgic gait with a cane.  Just neurosensory exam is intact  Imaging:      I personally reviewed and interpreted the radiographs.   Assessment:   2 weeks status post right hip gluteus maximus tendon transfer doing extremely well.  She will continue to advance according to the gluteus repair protocol plan to see her back in 4 weeks for reassessment  Plan :    - Return to clinic 4 weeks for reassessment      I personally saw and evaluated the patient, and participated in the management and treatment plan.  Shelby Parker, MD Attending Physician, Orthopedic Surgery  This document was dictated using Dragon voice recognition software. A reasonable attempt  at proof reading has been made to minimize errors.

## 2024-01-02 ENCOUNTER — Encounter

## 2024-01-06 ENCOUNTER — Encounter

## 2024-01-06 ENCOUNTER — Encounter (HOSPITAL_BASED_OUTPATIENT_CLINIC_OR_DEPARTMENT_OTHER): Payer: Self-pay | Admitting: Orthopaedic Surgery

## 2024-01-08 ENCOUNTER — Encounter

## 2024-01-08 ENCOUNTER — Encounter (HOSPITAL_BASED_OUTPATIENT_CLINIC_OR_DEPARTMENT_OTHER): Admitting: Physical Therapy

## 2024-01-09 ENCOUNTER — Other Ambulatory Visit: Payer: Self-pay

## 2024-01-09 ENCOUNTER — Other Ambulatory Visit (HOSPITAL_BASED_OUTPATIENT_CLINIC_OR_DEPARTMENT_OTHER): Payer: Self-pay

## 2024-01-09 ENCOUNTER — Other Ambulatory Visit (HOSPITAL_BASED_OUTPATIENT_CLINIC_OR_DEPARTMENT_OTHER): Payer: Self-pay | Admitting: Orthopaedic Surgery

## 2024-01-09 ENCOUNTER — Encounter (HOSPITAL_BASED_OUTPATIENT_CLINIC_OR_DEPARTMENT_OTHER): Payer: Self-pay | Admitting: Orthopaedic Surgery

## 2024-01-09 MED ORDER — OXYCODONE HCL 5 MG PO TABS
5.0000 mg | ORAL_TABLET | ORAL | 0 refills | Status: DC | PRN
  Filled 2024-01-09: qty 15, 3d supply, fill #0

## 2024-01-09 MED ORDER — OXYCODONE HCL 5 MG PO TABS
5.0000 mg | ORAL_TABLET | ORAL | 0 refills | Status: DC | PRN
  Filled 2024-01-09 – 2024-01-12 (×2): qty 20, 4d supply, fill #0

## 2024-01-12 ENCOUNTER — Other Ambulatory Visit: Payer: Self-pay

## 2024-01-13 ENCOUNTER — Encounter

## 2024-01-13 ENCOUNTER — Other Ambulatory Visit (HOSPITAL_BASED_OUTPATIENT_CLINIC_OR_DEPARTMENT_OTHER): Payer: Self-pay

## 2024-01-14 ENCOUNTER — Other Ambulatory Visit: Payer: Self-pay

## 2024-01-14 ENCOUNTER — Other Ambulatory Visit: Payer: Self-pay | Admitting: Pulmonary Disease

## 2024-01-14 DIAGNOSIS — J4550 Severe persistent asthma, uncomplicated: Secondary | ICD-10-CM

## 2024-01-14 DIAGNOSIS — J4540 Moderate persistent asthma, uncomplicated: Secondary | ICD-10-CM

## 2024-01-14 MED ORDER — TRELEGY ELLIPTA 200-62.5-25 MCG/ACT IN AEPB
1.0000 | INHALATION_SPRAY | Freq: Every day | RESPIRATORY_TRACT | 12 refills | Status: AC
  Filled 2024-01-14: qty 60, 30d supply, fill #0
  Filled 2024-03-02: qty 60, 30d supply, fill #1

## 2024-01-15 ENCOUNTER — Encounter

## 2024-01-15 ENCOUNTER — Encounter (HOSPITAL_BASED_OUTPATIENT_CLINIC_OR_DEPARTMENT_OTHER)

## 2024-01-17 ENCOUNTER — Encounter (HOSPITAL_BASED_OUTPATIENT_CLINIC_OR_DEPARTMENT_OTHER): Admitting: Physical Therapy

## 2024-01-20 ENCOUNTER — Encounter (HOSPITAL_BASED_OUTPATIENT_CLINIC_OR_DEPARTMENT_OTHER): Payer: Self-pay | Admitting: Orthopaedic Surgery

## 2024-01-20 ENCOUNTER — Other Ambulatory Visit: Payer: Self-pay

## 2024-01-20 ENCOUNTER — Encounter

## 2024-01-20 ENCOUNTER — Other Ambulatory Visit: Payer: Self-pay | Admitting: Orthopaedic Surgery

## 2024-01-20 MED ORDER — OXYCODONE HCL 5 MG PO TABS
5.0000 mg | ORAL_TABLET | ORAL | 0 refills | Status: AC | PRN
  Filled 2024-01-20: qty 20, 4d supply, fill #0

## 2024-01-20 NOTE — Telephone Encounter (Signed)
 Duplicate message already responded to via mychart

## 2024-01-21 ENCOUNTER — Ambulatory Visit

## 2024-01-21 DIAGNOSIS — M25551 Pain in right hip: Secondary | ICD-10-CM

## 2024-01-21 DIAGNOSIS — R269 Unspecified abnormalities of gait and mobility: Secondary | ICD-10-CM

## 2024-01-21 DIAGNOSIS — M6281 Muscle weakness (generalized): Secondary | ICD-10-CM | POA: Diagnosis not present

## 2024-01-21 DIAGNOSIS — R262 Difficulty in walking, not elsewhere classified: Secondary | ICD-10-CM

## 2024-01-21 DIAGNOSIS — R2689 Other abnormalities of gait and mobility: Secondary | ICD-10-CM

## 2024-01-21 NOTE — Therapy (Signed)
 " OUTPATIENT PHYSICAL THERAPY LOWER EXTREMITY TREATMENT   Patient Name: Shelby Henson MRN: [id number] DOB:January 31, 1970, 53 y.o., female Today's Date: 01/21/2024  END OF SESSION:  PT End of Session - 01/21/24 0754     Visit Number 3    Number of Visits 24    Date for Recertification  02/05/24    Progress Note Due on Visit 10    PT Start Time 0800    PT Stop Time 0834    PT Time Calculation (min) 34 min    Equipment Utilized During Treatment Gait belt    Activity Tolerance Patient tolerated treatment well    Behavior During Therapy WFL for tasks assessed/performed          Past Medical History:  Diagnosis Date   Anemia    Anxiety    Asthma    well controlled   Colon polyps    Complication of anesthesia    DDD (degenerative disc disease), lumbar    Depression    GERD (gastroesophageal reflux disease)    occ   Iron  deficiency anemia    Migraine    Morbid obesity (HCC)    Osteoarthritis    Osteoarthritis of right hip    PONV (postoperative nausea and vomiting)    with c-section x 1   Right rotator cuff tear 11/2022   Vitamin D  deficiency    Past Surgical History:  Procedure Laterality Date   BREAST CYST ASPIRATION Left    CESAREAN SECTION  1990   CESAREAN SECTION  1993   CESAREAN SECTION  1996   COLONOSCOPY N/A 09/22/2020   Procedure: COLONOSCOPY;  Surgeon: Janalyn Keene NOVAK, MD;  Location: [facility];  Service: Endoscopy;  Laterality: N/A;   LAPAROSCOPY  2018   LEEP  2005   benign pathology; Dr. Rolm   OPEN SURGICAL REPAIR OF GLUTEAL TENDON Right 12/17/2023   Procedure: REPAIR, TENDON, GLUTEUS MEDIUS, OPEN;  Surgeon: Genelle Standing, MD;  Location: [facility];  Service: Orthopedics;  Laterality: Right;  RIGHT GLUTEUS MAXIMUS TENDON TRANSFER   SHOULDER ACROMIOPLASTY Right 01/03/2023   Procedure: RIGHT SHOULDER ACROMIOPLASTY;  Surgeon: Genelle Standing, MD;  Location: [facility];  Service: Orthopedics;  Laterality: Right;   SHOULDER ARTHROSCOPY  WITH ROTATOR CUFF REPAIR AND OPEN BICEPS TENODESIS Right 01/03/2023   Procedure: RIGHT SHOULDER ARTHROSCOPY WITH ROTATOR CUFF REPAIR AND  BICEPS TENODESIS;  Surgeon: Genelle Standing, MD;  Location: [facility];  Service: Orthopedics;  Laterality: Right;   SUPRACERVICAL ABDOMINAL HYSTERECTOMY  2010   leio/AUB; Dr. Rolm   TOTAL HIP ARTHROPLASTY Right 11/12/2018   Procedure: TOTAL HIP ARTHROPLASTY ANTERIOR APPROACH;  Surgeon: Leora Lynwood SAUNDERS, MD;  Location: [facility];  Service: Orthopedics;  Laterality: Right;   Patient Active Problem List   Diagnosis Date Noted   Tendinopathy of right gluteus medius 12/17/2023   Severe persistent asthma without complication (HCC) 03/29/2023   Nontraumatic incomplete tear of right rotator cuff 01/03/2023   Right rotator cuff tendonitis 09/11/2022   Iron  deficiency anemia due to dietary causes 08/10/2022   Chronic right shoulder pain 05/11/2022   Morbid obesity (HCC) 01/05/2022   Rotator cuff impingement syndrome, right 09/29/2021   Biceps tendinitis, right 09/29/2021   Meralgia paresthetica of right side 03/29/2021   Psoas tendinitis, right hip 03/29/2021   History of total right hip arthroplasty 03/29/2021   Status post total replacement of right hip 02/19/2021   Meralgia paresthetica of right side 02/19/2021   Special screening for malignant neoplasms, colon  Polyp of ascending colon    Polyp of sigmoid colon    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 03/23/2020   Pain in right hip 01/12/2019   Vitamin D  deficiency 02/25/2018   Chronic pain 10/24/2015   Hot flashes 10/24/2015   Migraine without aura and without status migrainosus, not intractable 10/24/2015   Metabolic syndrome 10/24/2015   Dyslipidemia 10/24/2015   Allergic rhinitis, seasonal 05/17/2015   Chronic insomnia 08/08/2014   DDD (degenerative disc disease), lumbar 08/08/2014   Mood disorder 08/08/2014   Gastroesophageal reflux disease without esophagitis  08/08/2014   H/O total hysterectomy 08/08/2014   Irritable bowel syndrome with diarrhea 08/08/2014   Osteoarthritis of right hip 08/08/2014   Adult BMI 30+ 08/08/2014   SBO (spina bifida occulta) 08/08/2014    PCP: Dr. Dorette Loron  REFERRING PROVIDER: Dr. Elspeth Parker  REFERRING DIAG: Right hip gluteus medius tear, s/p Right hip gluteus maximus tendon transfer with gluteus medius repair.   THERAPY DIAG:  Muscle weakness (generalized)  Pain in right hip  Abnormality of gait and mobility  Difficulty in walking, not elsewhere classified  Imbalance  Rationale for Evaluation and Treatment: Rehabilitation  ONSET DATE: Surgery on 12/17/2023  SUBJECTIVE:   SUBJECTIVE STATEMENT: From Today: Patient reports nagging Right hip soreness- 6/10 today.  F/U with Ortho: 02/14/2024  From Previous visit: Patient reports just left MD office and had staples removed today. States MD is well pleased and she said she may not need much PT. States still swollen but feeling better overall and in talks with MD about possibly returning to work next week.   PERTINENT HISTORY: Per Medical record from 10/25/2023 ortho surgery note:  I did discuss that her MRI does show evidence of full-thickness gluteus medius and minimus tear with significant muscle atrophy status post right total hip arthroplasty.  At this time she has weakness and pain.  She has tried physical therapy as well as injections in the past without relief.  Given this I did discuss surgical options including gluteus maximus tendon transfer which I do believe would be ideal given the chronicity and multiple injections that she has had in the past.  At this time she would like to pursue this and we will proceed with this  -Plan for right hip gluteus maximus tendon transfer PMH- See above section- Previous THR Right hip in 2020.    PAIN:  Are you having pain? Yes: NPRS scale: 7/10 R lateral hip pain Pain location:   Pain description:  achy- constant Aggravating factors: Walking, prolonged sitting. Standing Relieving factors: rest, meds, Ice- polar care  PRECAUTIONS: Other: per protocol for Dr. Parker- avoid until 6 weeks: passive adduction and ER, Active Abduction and IR  RED FLAGS: None   WEIGHT BEARING RESTRICTIONS: Yes RLE Weight bearing as tolerated  FALLS:  Has patient fallen in last 6 months? No  LIVING ENVIRONMENT: Lives with: lives with their family Lives in: Mobile home Stairs: Yes: External: 5 steps; can reach both Has following equipment at home: Single point cane and Walker - 2 wheeled  OCCUPATION: front office   PLOF: Independent  PATIENT GOALS: Stop limping, no pain, walk better.  NEXT MD VISIT: Wednesday 01/01/2024 - Dr. Parker  OBJECTIVE:  Note: Objective measures were completed at Evaluation unless otherwise noted.  DIAGNOSTIC FINDINGS:  Right hip MRI: There is a near full-thickness tear of the gluteus medius and minimus with evidence of chronic atrophy on the T1 view  PATIENT SURVEYS:  LEFS  Extreme difficulty/unable (0), Quite a bit of difficulty (1), Moderate difficulty (2), Little difficulty (3), No difficulty (4) Survey date:   12/24/23  Any of your usual work, housework or school activities 0  2. Usual hobbies, recreational or sporting activities 0  3. Getting into/out of the bath 2  4. Walking between rooms 2  5. Putting on socks/shoes 1  6. Squatting  0  7. Lifting an object, like a bag of groceries from the floor 1  8. Performing light activities around your home 1  9. Performing heavy activities around your home 0  10. Getting into/out of a car 1  11. Walking 2 blocks 0  12. Walking 1 mile 0  13. Going up/down 10 stairs (1 flight) 1  14. Standing for 1 hour 0  15.  sitting for 1 hour 1  16. Running on even ground 0  17. Running on uneven ground 0  18. Making sharp turns while running fast 0  19. Hopping  0  20. Rolling over in bed 1  Score total:  11/80      COGNITION: Overall cognitive status: Within functional limits for tasks assessed     SENSATION: Light touch: WFL in both LE   EDEMA:  Observed swelling R hip vs. Left - did not measure today- Educated on use of ice as needed for swelling/pain.   MUSCLE LENGTH:   POSTURE: No Significant postural limitations  PALPATION: (+) tenderness- R lateral hip region along incision and general lateral aspect of upper leg.   LOWER EXTREMITY ROM:  Active ROM Right eval Left eval  Hip flexion Restricted to 90 deg   Hip extension    Hip abduction No active ABD active until week 6   Hip adduction No Passive ADD until week 6   Hip internal rotation No active IR until week 6   Hip external rotation No passive ER until week 6   Knee flexion    Knee extension    Ankle dorsiflexion    Ankle plantarflexion    Ankle inversion    Ankle eversion     (Blank rows = not tested)  LOWER EXTREMITY MMT:  MMT Right eval Left eval  Hip flexion 2- 5  Hip extension Isometric today 5  Hip abduction NT- due to precaution 5  Hip adduction NT 5  Hip internal rotation NT 5  Hip external rotation NT 5  Knee flexion 3+ 5  Knee extension 2- 5  Ankle dorsiflexion 4 5  Ankle plantarflexion    Ankle inversion    Ankle eversion     (Blank rows = not tested)  LOWER EXTREMITY SPECIAL TESTS:  N/A  FUNCTIONAL TESTS:  10 meter walk test: 0.42 m/s with SPC on Left side  GAIT: Distance walked: approx 80 feet Assistive device utilized: Single point cane Level of assistance: SBA Comments: Patient initially using cane on R side. Educated on trying to use on Left side to off load some weight on RLE. Patient with significant less limping  after switching. She exhibited short step to gait, antalgic on Right with decreased step length and foot clearance.  TREATMENT DATE:  *Review  protocol for next phase week-4-6 Gluteus Medius Repair for Dr. Eugene details including guidelines, goals, precautions, appropriate activities.   *Inspected Right hip- 4 Steri- strips remain - distal incision  with no sign of infection- no drainage, redness, excessive warmth to touch or odor- just swelling.   Therex:  Supine Bridging (attempted - painful) switched to gluteal sets (hold 5 sec) x 15 reps  Hip adduction (from neutral)-  x 10 sets Isometric Hip abduction (pain free) - Performed in sidelye (hold 5 sec x 15 and then supine (hooklye) x 5 sec  Core strength- transverse abdominal Isometric Hip flex- Hold 5 sec x 15 Isometric R  hip ext (hold 5 sec) 2 x 10 reps Isometric R Hip ER (hold 5 sec x 10)   PATIENT EDUCATION:  Education details: PT plan of care; Specific details of Rehab protocol specific for Dr. Genelle; gait techniques and use of cane, Weight bearing and other precautions and specific exercises as detailed in protocol. Person educated: Patient and dtr Education method: Explanation, Demonstration, Tactile cues, Verbal cues, and Handouts Education comprehension: verbalized understanding, returned demonstration, verbal cues required, tactile cues required, and needs further education  HOME EXERCISE PROGRAM:  Access Code: [email or web address] URL: [email or web address] Date: 01/21/2024 Prepared by: Reyes London  Exercises - Hooklying Isometric Hip Abduction with Belt  - 1 x daily - 3 sets - 10 reps - Supine Bridge  - 1 x daily - 3 sets - 10 reps - Sit to Stand with Arms Crossed  - 3 x weekly - 3 sets - 10 reps - Standing Hip Extension with Counter Support  - 3 x weekly - 3 sets - 10 reps - Standing Heel Raises  - 3 x weekly - 3 sets - 10 reps     Access Code: [email or web address] URL: [email or web address] Date: 12/25/2023 Prepared by: Reyes London  Exercises - Supine Hip Abduction PROM with Caregiver   - 1 x daily - 3 sets - 10 reps - Supine  Hip and Knee Flexion PROM with Caregiver  - 1 x daily - 3 sets - 10 reps - Supine Single Leg Hip Extension Isometric Into Table  - 1 x daily - 3 sets - 10 reps - 3-5 hold - Supine Isometric Hip Adduction with Pillow at Knees  - 1 x daily - 3 sets - 10 reps - 3-5 sec hold - Supine Isometric Hamstring Set  - 1 x daily - 3 sets - 10 reps - Supine Posterior Pelvic Tilt  - 1 x daily - 3 sets - 10 reps  ASSESSMENT:  CLINICAL IMPRESSION: Patient is a 53 y.o. female who was seen today for physical therapy treatment for Right gluteus medius tear- s/p Right hip gluteus maximus tendon transfer with gluteus medius repair. Treatment progressed to phase 2 ( 4-6) today. Incision healing well and patient was able to progress with all prescribed activities per protocol. She was able to perform all activities without report of increased pain.  She will benefit from skilled PT services to address her healing, pain limited mobility, muscle weakness and impaired balance to help enable her to return to her previous level of function.   OBJECTIVE IMPAIRMENTS: Abnormal gait, decreased activity tolerance, decreased balance, decreased coordination, decreased endurance, decreased knowledge of use of DME, decreased mobility, difficulty walking, decreased ROM, decreased strength, increased edema, impaired flexibility, and pain.   ACTIVITY LIMITATIONS: carrying, lifting, bending, sitting, standing, squatting, sleeping, stairs, transfers, and bed mobility  PARTICIPATION LIMITATIONS: meal prep, cleaning,  laundry, driving, shopping, community activity, occupation, yard work, and church  PERSONAL FACTORS: 1 comorbidity: chronic pain are also affecting patient's functional outcome.   REHAB POTENTIAL: Good  CLINICAL DECISION MAKING: Stable/uncomplicated  EVALUATION COMPLEXITY: Low   GOALS: Goals reviewed with patient? Yes  SHORT TERM GOALS: Target date: 02/04/2024  Pt will be independent with HEP in order to decrease ankle  pain and increase strength in order to improve pain-free function at home and work.  Baseline: EVAL- NO FORMAL HEP IN PLACE Goal status: INITIAL  2.  Patient will adhere to all prescribed precautions set by MD to reduce risk of re-injury or re-hospitalization.  Baseline: EVAL- Patient in learning phase of set precuations. Goal status: INITIAL    LONG TERM GOALS: Target date: 03/17/2024  Pt will increase LEFS by at least 30 points in order to demonstrate significant improvement in lower extremity function.   Baseline: EVAL = 11/80 Goal status: INITIAL  2.  Pt will decrease worst pain as reported on NPRS by at least 3 points in order to demonstrate clinically significant reduction in ankle/foot pain.  Baseline: EVAL= 10/10 at worst R hip Goal status: INITIAL  3.  Patient will present with normalized gait without an AD for short community and all household distances. Baseline: EVAL- Antalgic gait RLE with use of cane Goal status: INITIAL  4.  Pt will increase strength of R Hip  to within 1/2 MMT grade of uninvolved LE in order to demonstrate improvement in strength and function  Baseline: EVAL (See MMT chart)  Goal status: INITIAL  5.  Patient will perform SLS test on RLE > 10 sec  Baseline: EVAL - Not appropriate with precautions at this time. Goal status: INITIAL    PLAN:  PT FREQUENCY: 1-2x/week  PT DURATION: 12 weeks  PLANNED INTERVENTIONS: [id number]- PT Re-evaluation, 97750- Physical Performance Testing, 97110-Therapeutic exercises, 97530- Therapeutic activity, [id number]- Neuromuscular re-education, 97535- Self Care, [id number]- Manual therapy, [id number]- Gait training, [phone]- Orthotic Initial, [phone]- Orthotic/Prosthetic subsequent, [phone]- Canalith repositioning, [id number]- Aquatic Therapy, [phone]- Electrical stimulation (manual), [phone] (1-2 muscles), [phone] (3+ muscles)- Dry Needling, Patient/Family education, Balance training, Stair training, Taping, Joint mobilization, Scar mobilization,  Compression bandaging, Vestibular training, DME instructions, Cryotherapy, and Moist heat  PLAN FOR NEXT SESSION:  Continue with protocol for weeks 4-6.   Gait training with least restrictive AD   Reyes LOISE London, PT 01/21/2024, 8:43 AM  "

## 2024-01-22 ENCOUNTER — Encounter (HOSPITAL_BASED_OUTPATIENT_CLINIC_OR_DEPARTMENT_OTHER): Admitting: Physical Therapy

## 2024-01-22 ENCOUNTER — Encounter

## 2024-01-24 ENCOUNTER — Encounter (HOSPITAL_BASED_OUTPATIENT_CLINIC_OR_DEPARTMENT_OTHER)

## 2024-01-27 ENCOUNTER — Encounter (HOSPITAL_BASED_OUTPATIENT_CLINIC_OR_DEPARTMENT_OTHER): Payer: Self-pay | Admitting: Orthopaedic Surgery

## 2024-01-27 ENCOUNTER — Other Ambulatory Visit: Payer: Self-pay

## 2024-01-27 ENCOUNTER — Other Ambulatory Visit (HOSPITAL_BASED_OUTPATIENT_CLINIC_OR_DEPARTMENT_OTHER): Payer: Self-pay | Admitting: Student

## 2024-01-27 ENCOUNTER — Encounter

## 2024-01-27 MED ORDER — OXYCODONE HCL 5 MG PO TABS
5.0000 mg | ORAL_TABLET | ORAL | 0 refills | Status: DC | PRN
  Filled 2024-01-27: qty 15, 3d supply, fill #0

## 2024-01-29 ENCOUNTER — Ambulatory Visit

## 2024-01-29 ENCOUNTER — Encounter

## 2024-02-03 ENCOUNTER — Ambulatory Visit: Attending: Family Medicine

## 2024-02-03 DIAGNOSIS — R262 Difficulty in walking, not elsewhere classified: Secondary | ICD-10-CM | POA: Diagnosis present

## 2024-02-03 DIAGNOSIS — R269 Unspecified abnormalities of gait and mobility: Secondary | ICD-10-CM | POA: Insufficient documentation

## 2024-02-03 DIAGNOSIS — R2689 Other abnormalities of gait and mobility: Secondary | ICD-10-CM | POA: Insufficient documentation

## 2024-02-03 DIAGNOSIS — M6281 Muscle weakness (generalized): Secondary | ICD-10-CM | POA: Insufficient documentation

## 2024-02-03 DIAGNOSIS — M25551 Pain in right hip: Secondary | ICD-10-CM | POA: Diagnosis present

## 2024-02-03 NOTE — Therapy (Signed)
 " OUTPATIENT PHYSICAL THERAPY LOWER EXTREMITY TREATMENT   Patient Name: Shelby Henson MRN: [id number] DOB:October 31, 1970, 54 y.o., female Today's Date: 02/03/2024  END OF SESSION:  PT End of Session - 02/03/24 0801     Visit Number 4    Number of Visits 24    Date for Recertification  02/05/24    Progress Note Due on Visit 10    PT Start Time 0810    PT Stop Time 0844    PT Time Calculation (min) 34 min    Equipment Utilized During Treatment Gait belt    Activity Tolerance Patient tolerated treatment well    Behavior During Therapy WFL for tasks assessed/performed          Past Medical History:  Diagnosis Date   Anemia    Anxiety    Asthma    well controlled   Colon polyps    Complication of anesthesia    DDD (degenerative disc disease), lumbar    Depression    GERD (gastroesophageal reflux disease)    occ   Iron  deficiency anemia    Migraine    Morbid obesity (HCC)    Osteoarthritis    Osteoarthritis of right hip    PONV (postoperative nausea and vomiting)    with c-section x 1   Right rotator cuff tear 11/2022   Vitamin D  deficiency    Past Surgical History:  Procedure Laterality Date   BREAST CYST ASPIRATION Left    CESAREAN SECTION  1990   CESAREAN SECTION  1993   CESAREAN SECTION  1996   COLONOSCOPY N/A 09/22/2020   Procedure: COLONOSCOPY;  Surgeon: Janalyn Keene NOVAK, MD;  Location: [facility];  Service: Endoscopy;  Laterality: N/A;   LAPAROSCOPY  2018   LEEP  2005   benign pathology; Dr. Rolm   OPEN SURGICAL REPAIR OF GLUTEAL TENDON Right 12/17/2023   Procedure: REPAIR, TENDON, GLUTEUS MEDIUS, OPEN;  Surgeon: Genelle Standing, MD;  Location: [facility];  Service: Orthopedics;  Laterality: Right;  RIGHT GLUTEUS MAXIMUS TENDON TRANSFER   SHOULDER ACROMIOPLASTY Right 01/03/2023   Procedure: RIGHT SHOULDER ACROMIOPLASTY;  Surgeon: Genelle Standing, MD;  Location: [facility];  Service: Orthopedics;  Laterality: Right;   SHOULDER ARTHROSCOPY  WITH ROTATOR CUFF REPAIR AND OPEN BICEPS TENODESIS Right 01/03/2023   Procedure: RIGHT SHOULDER ARTHROSCOPY WITH ROTATOR CUFF REPAIR AND  BICEPS TENODESIS;  Surgeon: Genelle Standing, MD;  Location: [facility];  Service: Orthopedics;  Laterality: Right;   SUPRACERVICAL ABDOMINAL HYSTERECTOMY  2010   leio/AUB; Dr. Rolm   TOTAL HIP ARTHROPLASTY Right 11/12/2018   Procedure: TOTAL HIP ARTHROPLASTY ANTERIOR APPROACH;  Surgeon: Leora Lynwood SAUNDERS, MD;  Location: [facility];  Service: Orthopedics;  Laterality: Right;   Patient Active Problem List   Diagnosis Date Noted   Tendinopathy of right gluteus medius 12/17/2023   Severe persistent asthma without complication (HCC) 03/29/2023   Nontraumatic incomplete tear of right rotator cuff 01/03/2023   Right rotator cuff tendonitis 09/11/2022   Iron  deficiency anemia due to dietary causes 08/10/2022   Chronic right shoulder pain 05/11/2022   Morbid obesity (HCC) 01/05/2022   Rotator cuff impingement syndrome, right 09/29/2021   Biceps tendinitis, right 09/29/2021   Meralgia paresthetica of right side 03/29/2021   Psoas tendinitis, right hip 03/29/2021   History of total right hip arthroplasty 03/29/2021   Status post total replacement of right hip 02/19/2021   Meralgia paresthetica of right side 02/19/2021   Special screening for malignant neoplasms, colon  Polyp of ascending colon    Polyp of sigmoid colon    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 03/23/2020   Pain in right hip 01/12/2019   Vitamin D  deficiency 02/25/2018   Chronic pain 10/24/2015   Hot flashes 10/24/2015   Migraine without aura and without status migrainosus, not intractable 10/24/2015   Metabolic syndrome 10/24/2015   Dyslipidemia 10/24/2015   Allergic rhinitis, seasonal 05/17/2015   Chronic insomnia 08/08/2014   DDD (degenerative disc disease), lumbar 08/08/2014   Mood disorder 08/08/2014   Gastroesophageal reflux disease without esophagitis  08/08/2014   H/O total hysterectomy 08/08/2014   Irritable bowel syndrome with diarrhea 08/08/2014   Osteoarthritis of right hip 08/08/2014   Adult BMI 30+ 08/08/2014   SBO (spina bifida occulta) 08/08/2014    PCP: Dr. Dorette Loron  REFERRING PROVIDER: Dr. Elspeth Parker  REFERRING DIAG: Right hip gluteus medius tear, s/p Right hip gluteus maximus tendon transfer with gluteus medius repair.   THERAPY DIAG:  Muscle weakness (generalized)  Pain in right hip  Abnormality of gait and mobility  Difficulty in walking, not elsewhere classified  Imbalance  Rationale for Evaluation and Treatment: Rehabilitation  ONSET DATE: Surgery on 12/17/2023  SUBJECTIVE:   SUBJECTIVE STATEMENT: From Today: Patient reports she overdid it over the weekend and states moving around a lot more without cane  F/U with Ortho: 02/14/2024  From Previous visit: Patient reports just left MD office and had staples removed today. States MD is well pleased and she said she may not need much PT. States still swollen but feeling better overall and in talks with MD about possibly returning to work next week.   PERTINENT HISTORY: Per Medical record from 10/25/2023 ortho surgery note:  I did discuss that her MRI does show evidence of full-thickness gluteus medius and minimus tear with significant muscle atrophy status post right total hip arthroplasty.  At this time she has weakness and pain.  She has tried physical therapy as well as injections in the past without relief.  Given this I did discuss surgical options including gluteus maximus tendon transfer which I do believe would be ideal given the chronicity and multiple injections that she has had in the past.  At this time she would like to pursue this and we will proceed with this  -Plan for right hip gluteus maximus tendon transfer PMH- See above section- Previous THR Right hip in 2020.    PAIN:  Are you having pain? Yes: NPRS scale: 7/10 R lateral hip  pain Pain location:   Pain description: achy- constant Aggravating factors: Walking, prolonged sitting. Standing Relieving factors: rest, meds, Ice- polar care  PRECAUTIONS: Other: per protocol for Dr. Parker- avoid until 6 weeks: passive adduction and ER, Active Abduction and IR  RED FLAGS: None   WEIGHT BEARING RESTRICTIONS: Yes RLE Weight bearing as tolerated  FALLS:  Has patient fallen in last 6 months? No  LIVING ENVIRONMENT: Lives with: lives with their family Lives in: Mobile home Stairs: Yes: External: 5 steps; can reach both Has following equipment at home: Single point cane and Walker - 2 wheeled  OCCUPATION: front office   PLOF: Independent  PATIENT GOALS: Stop limping, no pain, walk better.  NEXT MD VISIT: Wednesday 02/14/2024 - Dr. Parker  OBJECTIVE:  Note: Objective measures were completed at Evaluation unless otherwise noted.  DIAGNOSTIC FINDINGS:  Right hip MRI: There is a near full-thickness tear of the gluteus medius and minimus with evidence of chronic atrophy  on the T1 view  PATIENT SURVEYS:  LEFS  Extreme difficulty/unable (0), Quite a bit of difficulty (1), Moderate difficulty (2), Little difficulty (3), No difficulty (4) Survey date:   12/24/23  Any of your usual work, housework or school activities 0  2. Usual hobbies, recreational or sporting activities 0  3. Getting into/out of the bath 2  4. Walking between rooms 2  5. Putting on socks/shoes 1  6. Squatting  0  7. Lifting an object, like a bag of groceries from the floor 1  8. Performing light activities around your home 1  9. Performing heavy activities around your home 0  10. Getting into/out of a car 1  11. Walking 2 blocks 0  12. Walking 1 mile 0  13. Going up/down 10 stairs (1 flight) 1  14. Standing for 1 hour 0  15.  sitting for 1 hour 1  16. Running on even ground 0  17. Running on uneven ground 0  18. Making sharp turns while running fast 0  19. Hopping  0  20. Rolling  over in bed 1  Score total:  11/80     COGNITION: Overall cognitive status: Within functional limits for tasks assessed     SENSATION: Light touch: WFL in both LE   EDEMA:  Observed swelling R hip vs. Left - did not measure today- Educated on use of ice as needed for swelling/pain.   MUSCLE LENGTH:   POSTURE: No Significant postural limitations  PALPATION: (+) tenderness- R lateral hip region along incision and general lateral aspect of upper leg.   LOWER EXTREMITY ROM:  Active ROM Right eval Left eval  Hip flexion Restricted to 90 deg   Hip extension    Hip abduction No active ABD active until week 6   Hip adduction No Passive ADD until week 6   Hip internal rotation No active IR until week 6   Hip external rotation No passive ER until week 6   Knee flexion    Knee extension    Ankle dorsiflexion    Ankle plantarflexion    Ankle inversion    Ankle eversion     (Blank rows = not tested)  LOWER EXTREMITY MMT:  MMT Right eval Left eval  Hip flexion 2- 5  Hip extension Isometric today 5  Hip abduction NT- due to precaution 5  Hip adduction NT 5  Hip internal rotation NT 5  Hip external rotation NT 5  Knee flexion 3+ 5  Knee extension 2- 5  Ankle dorsiflexion 4 5  Ankle plantarflexion    Ankle inversion    Ankle eversion     (Blank rows = not tested)  LOWER EXTREMITY SPECIAL TESTS:  N/A  FUNCTIONAL TESTS:  10 meter walk test: 0.42 m/s with SPC on Left side  GAIT: Distance walked: approx 80 feet Assistive device utilized: Single point cane Level of assistance: SBA Comments: Patient initially using cane on R side. Educated on trying to use on Left side to off load some weight on RLE. Patient with significant less limping  after switching. She exhibited short step to gait, antalgic on Right with decreased step length and foot clearance.  TREATMENT DATE: 02/03/2024   *Review protocol for next phase week 6-10 Gluteus Medius Repair for Dr. Eugene details including guidelines, goals, precautions, appropriate activities.    Manual therapy:  Prone PA mobs to R femur - 30 oscillations x 3 sets Mobilization with movement- PA femur/ilium glides with gentle ER/IR in prone- x several min R hip joint mobs (lateral and inferior) with Belt x several min  Therex:  Supine Bridging (attempted - painful) switched to gluteal sets (hold 5 sec) x 15 reps  Supine Hip adduction (from neutral)-  x 10 sets Standing hip 4-way- 2 x 10 reps  Standing minisquat 2 x 10 reps Standing calf raises 2 x 10 reps Fwd step up onto 4 step x 10 reps each LE (min UE support for RLE) Lateral step up onto 2 step x 10 reps ea LE w/o UE support.   PATIENT EDUCATION:  Education details: PT plan of care; Specific details of Rehab protocol specific for Dr. Genelle; gait techniques and use of cane, Weight bearing and other precautions and specific exercises as detailed in protocol. Person educated: Patient and dtr Education method: Explanation, Demonstration, Tactile cues, Verbal cues, and Handouts Education comprehension: verbalized understanding, returned demonstration, verbal cues required, tactile cues required, and needs further education  HOME EXERCISE PROGRAM:  Access Code: [email or web address] URL: [email or web address] Date: 01/21/2024 Prepared by: Reyes London  Exercises - Hooklying Isometric Hip Abduction with Belt  - 1 x daily - 3 sets - 10 reps - Supine Bridge  - 1 x daily - 3 sets - 10 reps - Sit to Stand with Arms Crossed  - 3 x weekly - 3 sets - 10 reps - Standing Hip Extension with Counter Support  - 3 x weekly - 3 sets - 10 reps - Standing Heel Raises  - 3 x weekly - 3 sets - 10 reps     Access Code: [email or web address] URL: [email or web address] Date: 12/25/2023 Prepared by: Reyes London  Exercises -  Supine Hip Abduction PROM with Caregiver   - 1 x daily - 3 sets - 10 reps - Supine Hip and Knee Flexion PROM with Caregiver  - 1 x daily - 3 sets - 10 reps - Supine Single Leg Hip Extension Isometric Into Table  - 1 x daily - 3 sets - 10 reps - 3-5 hold - Supine Isometric Hip Adduction with Pillow at Knees  - 1 x daily - 3 sets - 10 reps - 3-5 sec hold - Supine Isometric Hamstring Set  - 1 x daily - 3 sets - 10 reps - Supine Posterior Pelvic Tilt  - 1 x daily - 3 sets - 10 reps  ASSESSMENT:  CLINICAL IMPRESSION: Patient is a 55 y.o. female who was seen today for physical therapy treatment for Right gluteus medius tear- s/p Right hip gluteus maximus tendon transfer with gluteus medius repair. Treatment progressed to phase 6-10 weeks today including progressing to some active R hip abd and rotation - With careful monitoring for pain/technique. Patient performed well with transition to more active movement- performing well overall without report of any pain except forward step ups.    She will benefit from skilled PT services to address her healing, pain limited mobility, muscle weakness and impaired balance to help enable her to return to her previous level of function.   OBJECTIVE IMPAIRMENTS: Abnormal gait, decreased activity tolerance, decreased balance, decreased coordination, decreased endurance, decreased knowledge of use of DME, decreased mobility, difficulty walking, decreased ROM, decreased strength, increased edema, impaired flexibility,  and pain.   ACTIVITY LIMITATIONS: carrying, lifting, bending, sitting, standing, squatting, sleeping, stairs, transfers, and bed mobility  PARTICIPATION LIMITATIONS: meal prep, cleaning, laundry, driving, shopping, community activity, occupation, yard work, and church  PERSONAL FACTORS: 1 comorbidity: chronic pain are also affecting patient's functional outcome.   REHAB POTENTIAL: Good  CLINICAL DECISION MAKING: Stable/uncomplicated  EVALUATION  COMPLEXITY: Low   GOALS: Goals reviewed with patient? Yes  SHORT TERM GOALS: Target date: 02/04/2024  Pt will be independent with HEP in order to decrease ankle pain and increase strength in order to improve pain-free function at home and work.  Baseline: EVAL- NO FORMAL HEP IN PLACE Goal status: INITIAL  2.  Patient will adhere to all prescribed precautions set by MD to reduce risk of re-injury or re-hospitalization.  Baseline: EVAL- Patient in learning phase of set precuations. Goal status: INITIAL    LONG TERM GOALS: Target date: 03/17/2024  Pt will increase LEFS by at least 30 points in order to demonstrate significant improvement in lower extremity function.   Baseline: EVAL = 11/80 Goal status: INITIAL  2.  Pt will decrease worst pain as reported on NPRS by at least 3 points in order to demonstrate clinically significant reduction in ankle/foot pain.  Baseline: EVAL= 10/10 at worst R hip Goal status: INITIAL  3.  Patient will present with normalized gait without an AD for short community and all household distances. Baseline: EVAL- Antalgic gait RLE with use of cane Goal status: INITIAL  4.  Pt will increase strength of R Hip  to within 1/2 MMT grade of uninvolved LE in order to demonstrate improvement in strength and function  Baseline: EVAL (See MMT chart)  Goal status: INITIAL  5.  Patient will perform SLS test on RLE > 10 sec  Baseline: EVAL - Not appropriate with precautions at this time. Goal status: INITIAL    PLAN:  PT FREQUENCY: 1-2x/week  PT DURATION: 12 weeks  PLANNED INTERVENTIONS: [id number]- PT Re-evaluation, 97750- Physical Performance Testing, 97110-Therapeutic exercises, 97530- Therapeutic activity, [id number]- Neuromuscular re-education, 97535- Self Care, [id number]- Manual therapy, [id number]- Gait training, [phone]- Orthotic Initial, [phone]- Orthotic/Prosthetic subsequent, [phone]- Canalith repositioning, [phone]- Aquatic Therapy, [phone]- Electrical stimulation (manual),  [phone] (1-2 muscles), [phone] (3+ muscles)- Dry Needling, Patient/Family education, Balance training, Stair training, Taping, Joint mobilization, Scar mobilization, Compression bandaging, Vestibular training, DME instructions, Cryotherapy, and Moist heat  PLAN FOR NEXT SESSION:  Continue with protocol for weeks 6-10   Gait training with least restrictive AD   Reyes LOISE London, PT 02/03/2024, 9:09 AM  "

## 2024-02-06 ENCOUNTER — Other Ambulatory Visit (HOSPITAL_BASED_OUTPATIENT_CLINIC_OR_DEPARTMENT_OTHER): Payer: Self-pay | Admitting: Orthopaedic Surgery

## 2024-02-06 ENCOUNTER — Other Ambulatory Visit: Payer: Self-pay

## 2024-02-06 ENCOUNTER — Encounter (HOSPITAL_BASED_OUTPATIENT_CLINIC_OR_DEPARTMENT_OTHER): Payer: Self-pay | Admitting: Orthopaedic Surgery

## 2024-02-06 MED ORDER — OXYCODONE HCL 5 MG PO TABS
5.0000 mg | ORAL_TABLET | ORAL | 0 refills | Status: AC | PRN
  Filled 2024-02-06: qty 15, 3d supply, fill #0

## 2024-02-10 ENCOUNTER — Other Ambulatory Visit: Payer: Self-pay

## 2024-02-13 ENCOUNTER — Ambulatory Visit

## 2024-02-13 DIAGNOSIS — R269 Unspecified abnormalities of gait and mobility: Secondary | ICD-10-CM

## 2024-02-13 DIAGNOSIS — R2689 Other abnormalities of gait and mobility: Secondary | ICD-10-CM

## 2024-02-13 DIAGNOSIS — M6281 Muscle weakness (generalized): Secondary | ICD-10-CM

## 2024-02-13 DIAGNOSIS — R262 Difficulty in walking, not elsewhere classified: Secondary | ICD-10-CM

## 2024-02-13 DIAGNOSIS — M25551 Pain in right hip: Secondary | ICD-10-CM

## 2024-02-13 NOTE — Therapy (Signed)
 " OUTPATIENT PHYSICAL THERAPY LOWER EXTREMITY TREATMENT/RECERT   Patient Name: Shelby Henson MRN: [id number] DOB:1970/09/07, 54 y.o., female Today's Date: 02/14/2024  END OF SESSION:  PT End of Session - 02/13/24 0757     Visit Number 5    Number of Visits 29    Date for Recertification  05/07/24    Authorization Type [employer] [id number]    Progress Note Due on Visit 10    PT Start Time 0800    PT Stop Time 0842    PT Time Calculation (min) 42 min    Equipment Utilized During Treatment Gait belt    Activity Tolerance Patient tolerated treatment well    Behavior During Therapy WFL for tasks assessed/performed          Past Medical History:  Diagnosis Date   Anemia    Anxiety    Asthma    well controlled   Colon polyps    Complication of anesthesia    DDD (degenerative disc disease), lumbar    Depression    GERD (gastroesophageal reflux disease)    occ   Iron  deficiency anemia    Migraine    Morbid obesity (HCC)    Osteoarthritis    Osteoarthritis of right hip    PONV (postoperative nausea and vomiting)    with c-section x 1   Right rotator cuff tear 11/2022   Vitamin D  deficiency    Past Surgical History:  Procedure Laterality Date   BREAST CYST ASPIRATION Left    CESAREAN SECTION  1990   CESAREAN SECTION  1993   CESAREAN SECTION  1996   COLONOSCOPY N/A 09/22/2020   Procedure: COLONOSCOPY;  Surgeon: Janalyn Keene NOVAK, MD;  Location: [facility];  Service: Endoscopy;  Laterality: N/A;   LAPAROSCOPY  2018   LEEP  2005   benign pathology; Dr. Rolm   OPEN SURGICAL REPAIR OF GLUTEAL TENDON Right 12/17/2023   Procedure: REPAIR, TENDON, GLUTEUS MEDIUS, OPEN;  Surgeon: Genelle Standing, MD;  Location: [facility];  Service: Orthopedics;  Laterality: Right;  RIGHT GLUTEUS MAXIMUS TENDON TRANSFER   SHOULDER ACROMIOPLASTY Right 01/03/2023   Procedure: RIGHT SHOULDER ACROMIOPLASTY;  Surgeon: Genelle Standing, MD;  Location: [facility];  Service: Orthopedics;   Laterality: Right;   SHOULDER ARTHROSCOPY WITH ROTATOR CUFF REPAIR AND OPEN BICEPS TENODESIS Right 01/03/2023   Procedure: RIGHT SHOULDER ARTHROSCOPY WITH ROTATOR CUFF REPAIR AND  BICEPS TENODESIS;  Surgeon: Genelle Standing, MD;  Location: [facility];  Service: Orthopedics;  Laterality: Right;   SUPRACERVICAL ABDOMINAL HYSTERECTOMY  2010   leio/AUB; Dr. Rolm   TOTAL HIP ARTHROPLASTY Right 11/12/2018   Procedure: TOTAL HIP ARTHROPLASTY ANTERIOR APPROACH;  Surgeon: Leora Lynwood SAUNDERS, MD;  Location: [facility];  Service: Orthopedics;  Laterality: Right;   Patient Active Problem List   Diagnosis Date Noted   Tendinopathy of right gluteus medius 12/17/2023   Severe persistent asthma without complication (HCC) 03/29/2023   Nontraumatic incomplete tear of right rotator cuff 01/03/2023   Right rotator cuff tendonitis 09/11/2022   Iron  deficiency anemia due to dietary causes 08/10/2022   Chronic right shoulder pain 05/11/2022   Morbid obesity (HCC) 01/05/2022   Rotator cuff impingement syndrome, right 09/29/2021   Biceps tendinitis, right 09/29/2021   Meralgia paresthetica of right side 03/29/2021   Psoas tendinitis, right hip 03/29/2021   History of total right hip arthroplasty 03/29/2021   Status post total replacement of right hip 02/19/2021   Meralgia paresthetica of right side 02/19/2021   Special screening  for malignant neoplasms, colon    Polyp of ascending colon    Polyp of sigmoid colon    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 03/23/2020   Pain in right hip 01/12/2019   Vitamin D  deficiency 02/25/2018   Chronic pain 10/24/2015   Hot flashes 10/24/2015   Migraine without aura and without status migrainosus, not intractable 10/24/2015   Metabolic syndrome 10/24/2015   Dyslipidemia 10/24/2015   Allergic rhinitis, seasonal 05/17/2015   Chronic insomnia 08/08/2014   DDD (degenerative disc disease), lumbar 08/08/2014   Mood disorder 08/08/2014    Gastroesophageal reflux disease without esophagitis 08/08/2014   H/O total hysterectomy 08/08/2014   Irritable bowel syndrome with diarrhea 08/08/2014   Osteoarthritis of right hip 08/08/2014   Adult BMI 30+ 08/08/2014   SBO (spina bifida occulta) 08/08/2014    PCP: Dr. Dorette Loron  REFERRING PROVIDER: Dr. Elspeth Parker  REFERRING DIAG: Right hip gluteus medius tear, s/p Right hip gluteus maximus tendon transfer with gluteus medius repair.   THERAPY DIAG:  Muscle weakness (generalized) - Plan: PT plan of care cert/re-cert  Pain in right hip - Plan: PT plan of care cert/re-cert  Abnormality of gait and mobility - Plan: PT plan of care cert/re-cert  Difficulty in walking, not elsewhere classified - Plan: PT plan of care cert/re-cert  Imbalance - Plan: PT plan of care cert/re-cert  Rationale for Evaluation and Treatment: Rehabilitation  ONSET DATE: Surgery on 12/17/2023  SUBJECTIVE:   SUBJECTIVE STATEMENT: From Today: Patient reports progressing well overall- Still having some soreness - often reports overdoing it but using cane less without significant limp or difficulty and using cane if feeling sore or fatigued. Reports compliant with current HEP - following MD protocol.  F/U with Ortho: 02/14/2024  PERTINENT HISTORY: Per Medical record from 10/25/2023 ortho surgery note:  I did discuss that her MRI does show evidence of full-thickness gluteus medius and minimus tear with significant muscle atrophy status post right total hip arthroplasty.  At this time she has weakness and pain.  She has tried physical therapy as well as injections in the past without relief.  Given this I did discuss surgical options including gluteus maximus tendon transfer which I do believe would be ideal given the chronicity and multiple injections that she has had in the past.  At this time she would like to pursue this and we will proceed with this  -Plan for right hip gluteus maximus tendon  transfer PMH- See above section- Previous THR Right hip in 2020.    PAIN:  Are you having pain? Yes: NPRS scale: 7/10 R lateral hip pain Pain location:   Pain description: achy- constant Aggravating factors: Walking, prolonged sitting. Standing Relieving factors: rest, meds, Ice- polar care  PRECAUTIONS: Other: per protocol for Dr. Parker- avoid until 6 weeks: passive adduction and ER, Active Abduction and IR  RED FLAGS: None   WEIGHT BEARING RESTRICTIONS: Yes RLE Weight bearing as tolerated  FALLS:  Has patient fallen in last 6 months? No  LIVING ENVIRONMENT: Lives with: lives with their family Lives in: Mobile home Stairs: Yes: External: 5 steps; can reach both Has following equipment at home: Single point cane and Walker - 2 wheeled  OCCUPATION: front office   PLOF: Independent  PATIENT GOALS: Stop limping, no pain, walk better.  NEXT MD VISIT: Wednesday 02/14/2024 - Dr. Parker  OBJECTIVE:  Note: Objective measures were completed at Evaluation unless otherwise noted.  DIAGNOSTIC FINDINGS:  Right hip MRI: There  is a near full-thickness tear of the gluteus medius and minimus with evidence of chronic atrophy on the T1 view  PATIENT SURVEYS:  LEFS  Extreme difficulty/unable (0), Quite a bit of difficulty (1), Moderate difficulty (2), Little difficulty (3), No difficulty (4) Survey date:   12/24/23 02/13/2024  Any of your usual work, housework or school activities 0 2  2. Usual hobbies, recreational or sporting activities 0 2  3. Getting into/out of the bath 2 3  4. Walking between rooms 2 3  5. Putting on socks/shoes 1 2  6. Squatting  0 2  7. Lifting an object, like a bag of groceries from the floor 1 3  8. Performing light activities around your home 1 3  9. Performing heavy activities around your home 0 1  10. Getting into/out of a car 1 3  11. Walking 2 blocks 0 0  12. Walking 1 mile 0 0  13. Going up/down 10 stairs (1 flight) 1 2  14. Standing for 1 hour  0 1  15.  sitting for 1 hour 1 2  16. Running on even ground 0 0  17. Running on uneven ground 0 0  18. Making sharp turns while running fast 0 0  19. Hopping  0 0  20. Rolling over in bed 1 2  Score total:  11/80 31/80     COGNITION: Overall cognitive status: Within functional limits for tasks assessed     SENSATION: Light touch: WFL in both LE   EDEMA:  Observed swelling R hip vs. Left - did not measure today- Educated on use of ice as needed for swelling/pain.   MUSCLE LENGTH:   POSTURE: No Significant postural limitations  PALPATION: (+) tenderness- R lateral hip region along incision and general lateral aspect of upper leg.   LOWER EXTREMITY ROM:  Active ROM Right eval Left eval  Hip flexion Restricted to 90 deg   Hip extension    Hip abduction No active ABD active until week 6   Hip adduction No Passive ADD until week 6   Hip internal rotation No active IR until week 6   Hip external rotation No passive ER until week 6   Knee flexion    Knee extension    Ankle dorsiflexion    Ankle plantarflexion    Ankle inversion    Ankle eversion     (Blank rows = not tested)  LOWER EXTREMITY MMT:  MMT Right eval Left eval Right  02/13/24  Hip flexion 2- 5 3+  Hip extension Isometric today 5 3+  Hip abduction NT- due to precaution 5 3+  Hip adduction NT 5   Hip internal rotation NT 5   Hip external rotation NT 5   Knee flexion 3+ 5   Knee extension 2- 5   Ankle dorsiflexion 4 5   Ankle plantarflexion     Ankle inversion     Ankle eversion      (Blank rows = not tested)  LOWER EXTREMITY SPECIAL TESTS:  N/A  FUNCTIONAL TESTS:  10 meter walk test: 0.42 m/s with SPC on Left side  GAIT: Distance walked: approx 80 feet Assistive device utilized: Single point cane Level of assistance: SBA Comments: Patient initially using cane on R side. Educated on trying to use on Left side to off load some weight on RLE. Patient with significant less limping  after  switching. She exhibited short step to gait, antalgic on Right with decreased step length and foot clearance.  TREATMENT DATE: 02/14/24   *Review protocol for next phase week 6-10 Gluteus Medius Repair for Dr. Eugene details including guidelines, goals, precautions, appropriate activities.  See above charts for LEFS and MMT goals  Physical therapy treatment session today consisted of completing assessment of goals and administration of testing as demonstrated and documented in flow sheet, treatment, and goals section of this note. Addition treatments may be found below.     Manual therapy:  Prone PA mobs to R femur - 30 oscillations x 3 sets Mobilization with movement- PA femur/ilium glides with gentle ER/IR in prone- x several min R hip joint mobs (lateral and inferior) with Belt x several min  Therex:  Supine Hip adduction (from neutral)-  x 10 sets Standing hip 4-way- 2 x 10 reps  Standing single leg calf raises 2 x 10 reps Fwd step up onto 4 step x 10 reps each LE (min UE support for RLE) Lateral step up onto 2 step x 10 reps ea LE w/o UE support.   PATIENT EDUCATION:  Education details: PT plan of care; Specific details of Rehab protocol specific for Dr. Genelle; gait techniques and use of cane, Weight bearing and other precautions and specific exercises as detailed in protocol. Person educated: Patient and dtr Education method: Explanation, Demonstration, Tactile cues, Verbal cues, and Handouts Education comprehension: verbalized understanding, returned demonstration, verbal cues required, tactile cues required, and needs further education  HOME EXERCISE PROGRAM:  Access Code: [email or web address] URL: [email or web address] Date: 01/21/2024 Prepared by: Reyes London  Exercises - Hooklying Isometric Hip Abduction with Belt  - 1 x daily -  3 sets - 10 reps - Supine Bridge  - 1 x daily - 3 sets - 10 reps - Sit to Stand with Arms Crossed  - 3 x weekly - 3 sets - 10 reps - Standing Hip Extension with Counter Support  - 3 x weekly - 3 sets - 10 reps - Standing Heel Raises  - 3 x weekly - 3 sets - 10 reps     Access Code: [email or web address] URL: [email or web address] Date: 12/25/2023 Prepared by: Reyes London  Exercises - Supine Hip Abduction PROM with Caregiver   - 1 x daily - 3 sets - 10 reps - Supine Hip and Knee Flexion PROM with Caregiver  - 1 x daily - 3 sets - 10 reps - Supine Single Leg Hip Extension Isometric Into Table  - 1 x daily - 3 sets - 10 reps - 3-5 hold - Supine Isometric Hip Adduction with Pillow at Knees  - 1 x daily - 3 sets - 10 reps - 3-5 sec hold - Supine Isometric Hamstring Set  - 1 x daily - 3 sets - 10 reps - Supine Posterior Pelvic Tilt  - 1 x daily - 3 sets - 10 reps  ASSESSMENT:  CLINICAL IMPRESSION: Patient is a 54 y.o. female who was seen today for physical therapy treatment for Right gluteus medius tear- s/p Right hip gluteus maximus tendon transfer with gluteus medius repair. Reassessed goals for recert and patient progressing well overall- has met her short term goals. Treatment continues to follow MD prescribed protocol for weeks 6-10 - With careful monitoring for pain/technique. Anticipate transition to more functional activities (as protocol allows) with more gluteal strengthening and muscle heals. Patient's condition has the potential to improve in response to therapy. Maximum improvement is yet to be obtained. The anticipated improvement is attainable and reasonable in a generally predictable time. She will benefit from skilled PT services to  address her healing, pain limited mobility, muscle weakness and impaired balance to help enable her to return to her previous level of function.   OBJECTIVE IMPAIRMENTS: Abnormal gait, decreased activity tolerance, decreased balance,  decreased coordination, decreased endurance, decreased knowledge of use of DME, decreased mobility, difficulty walking, decreased ROM, decreased strength, increased edema, impaired flexibility, and pain.   ACTIVITY LIMITATIONS: carrying, lifting, bending, sitting, standing, squatting, sleeping, stairs, transfers, and bed mobility  PARTICIPATION LIMITATIONS: meal prep, cleaning, laundry, driving, shopping, community activity, occupation, yard work, and church  PERSONAL FACTORS: 1 comorbidity: chronic pain are also affecting patient's functional outcome.   REHAB POTENTIAL: Good  CLINICAL DECISION MAKING: Stable/uncomplicated  EVALUATION COMPLEXITY: Low   GOALS: Goals reviewed with patient? Yes  SHORT TERM GOALS: Target date: 02/04/2024  Pt will be independent with HEP in order to decrease ankle pain and increase strength in order to improve pain-free function at home and work.  Baseline: EVAL- NO FORMAL HEP IN PLACE; 02/13/2024= Patient knowledgeable of HEP and compliant with all activities.  Goal status: MET  2.  Patient will adhere to all prescribed precautions set by MD to reduce risk of re-injury or re-hospitalization.  Baseline: EVAL- Patient in learning phase of set precuations. 02/13/2024= Patient knowledgeable of all hip precautions and following protocol set in place by MD. Goal status: MET    LONG TERM GOALS: Target date: 05/07/2024  Pt will increase LEFS by at least 30 points in order to demonstrate significant improvement in lower extremity function.   Baseline: EVAL = 11/80; 02/13/2024= 31/80 Goal status: PROGRESSING  2.  Pt will decrease worst pain as reported on NPRS by at least 3 points in order to demonstrate clinically significant reduction in ankle/foot pain.  Baseline: EVAL= 10/10 at worst R hip; 02/13/2024= current= 2/10; at worst at night could be up to 7/10 Goal status: PROGRESSING  3.  Patient will present with normalized gait without an AD for short community  and all household distances. Baseline: EVAL- Antalgic gait RLE with use of cane; 02/13/2024- Patient continues to have some at times and intermittently using her cane.  Goal status: PROGRESSING  4.  Pt will increase strength of R Hip  to within 1/2 MMT grade of uninvolved LE in order to demonstrate improvement in strength and function  Baseline: EVAL (See MMT chart); 02/13/2024= 3+ Right hip abd Goal status: INITIAL  5.  Patient will perform SLS test on RLE > 10 sec  Baseline: EVAL - Not appropriate with precautions at this time. Goal status: INITIAL    PLAN:  PT FREQUENCY: 1-2x/week  PT DURATION: 12 weeks  PLANNED INTERVENTIONS: [id number]- PT Re-evaluation, 97750- Physical Performance Testing, 97110-Therapeutic exercises, 97530- Therapeutic activity, [id number]- Neuromuscular re-education, 97535- Self Care, [id number]- Manual therapy, [id number]- Gait training, [phone]- Orthotic Initial, [phone]- Orthotic/Prosthetic subsequent, [phone]- Canalith repositioning, [phone]- Aquatic Therapy, [phone]- Electrical stimulation (manual), [phone] (1-2 muscles), [phone] (3+ muscles)- Dry Needling, Patient/Family education, Balance training, Stair training, Taping, Joint mobilization, Scar mobilization, Compression bandaging, Vestibular training, DME instructions, Cryotherapy, and Moist heat  PLAN FOR NEXT SESSION:  Continue with protocol for weeks 6-10   Gait training with least restrictive AD   Reyes LOISE London, PT 02/14/2024, 9:47 AM  "

## 2024-02-14 ENCOUNTER — Ambulatory Visit (HOSPITAL_BASED_OUTPATIENT_CLINIC_OR_DEPARTMENT_OTHER): Admitting: Orthopaedic Surgery

## 2024-02-14 DIAGNOSIS — M67951 Unspecified disorder of synovium and tendon, right thigh: Secondary | ICD-10-CM

## 2024-02-14 NOTE — Progress Notes (Signed)
 "                                Post Operative Evaluation    Procedure/Date of Surgery: Right hip gluteus maximus tendon transfer 11/18  Interval History:    Presents 6 weeks status post right hip gluteus maximus tendon transfer.  She is continuing to improve.  She no longer walks with nonantalgic gait.  She does still experience some soreness about the lateral hip   PMH/PSH/Family History/Social History/Meds/Allergies:    Past Medical History:  Diagnosis Date   Anemia    Anxiety    Asthma    well controlled   Colon polyps    Complication of anesthesia    DDD (degenerative disc disease), lumbar    Depression    GERD (gastroesophageal reflux disease)    occ   Iron  deficiency anemia    Migraine    Morbid obesity (HCC)    Osteoarthritis    Osteoarthritis of right hip    PONV (postoperative nausea and vomiting)    with c-section x 1   Right rotator cuff tear 11/2022   Vitamin D  deficiency    Past Surgical History:  Procedure Laterality Date   BREAST CYST ASPIRATION Left    CESAREAN SECTION  1990   CESAREAN SECTION  1993   CESAREAN SECTION  1996   COLONOSCOPY N/A 09/22/2020   Procedure: COLONOSCOPY;  Surgeon: Janalyn Keene NOVAK, MD;  Location: [facility];  Service: Endoscopy;  Laterality: N/A;   LAPAROSCOPY  2018   LEEP  2005   benign pathology; Dr. Rolm   OPEN SURGICAL REPAIR OF GLUTEAL TENDON Right 12/17/2023   Procedure: REPAIR, TENDON, GLUTEUS MEDIUS, OPEN;  Surgeon: Genelle Standing, MD;  Location: [facility];  Service: Orthopedics;  Laterality: Right;  RIGHT GLUTEUS MAXIMUS TENDON TRANSFER   SHOULDER ACROMIOPLASTY Right 01/03/2023   Procedure: RIGHT SHOULDER ACROMIOPLASTY;  Surgeon: Genelle Standing, MD;  Location: [facility];  Service: Orthopedics;  Laterality: Right;   SHOULDER ARTHROSCOPY WITH ROTATOR CUFF REPAIR AND OPEN BICEPS TENODESIS Right 01/03/2023   Procedure: RIGHT SHOULDER ARTHROSCOPY WITH ROTATOR CUFF REPAIR AND  BICEPS  TENODESIS;  Surgeon: Genelle Standing, MD;  Location: [facility];  Service: Orthopedics;  Laterality: Right;   SUPRACERVICAL ABDOMINAL HYSTERECTOMY  2010   leio/AUB; Dr. Rolm   TOTAL HIP ARTHROPLASTY Right 11/12/2018   Procedure: TOTAL HIP ARTHROPLASTY ANTERIOR APPROACH;  Surgeon: Leora Lynwood SAUNDERS, MD;  Location: [facility];  Service: Orthopedics;  Laterality: Right;   Social History   Socioeconomic History   Marital status: Divorced    Spouse name: Not on file   Number of children: 3   Years of education: Not on file   Highest education level: Associate degree: occupational, scientist, product/process development, or vocational program  Occupational History   Not on file  Tobacco Use   Smoking status: Never   Smokeless tobacco: Never  Vaping Use   Vaping status: Never Used  Substance and Sexual Activity   Alcohol use: Yes    Alcohol/week: 0.0 standard drinks of alcohol    Comment: occasionally   Drug use: No   Sexual activity: Yes    Birth control/protection: Surgical    Comment: Hysterectomy  Other Topics Concern   Not on file  Social History Narrative   Not on file   Social Drivers of Health   Tobacco Use: Low Risk (02/13/2024)   Patient History    Smoking Tobacco Use:  Never    Smokeless Tobacco Use: Never    Passive Exposure: Not on file  Financial Resource Strain: Low Risk (10/19/2023)   Overall Financial Resource Strain (CARDIA)    Difficulty of Paying Living Expenses: Not very hard  Food Insecurity: Patient Declined (10/19/2023)   Epic    Worried About Programme Researcher, Broadcasting/film/video in the Last Year: Patient declined    Barista in the Last Year: Patient declined  Transportation Needs: No Transportation Needs (10/19/2023)   Epic    Lack of Transportation (Medical): No    Lack of Transportation (Non-Medical): No  Physical Activity: Inactive (10/19/2023)   Exercise Vital Sign    Days of Exercise per Week: 0 days    Minutes of Exercise per Session: Not on file  Stress: Stress Concern Present  (10/19/2023)   [employer] of Occupational Health - Occupational Stress Questionnaire    Feeling of Stress: Rather much  Social Connections: Socially Isolated (10/19/2023)   Social Connection and Isolation Panel    Frequency of Communication with Friends and Family: More than three times a week    Frequency of Social Gatherings with Friends and Family: Three times a week    Attends Religious Services: Patient declined    Active Member of Clubs or Organizations: No    Attends Banker Meetings: Not on file    Marital Status: Divorced  Depression (PHQ2-9): Low Risk (10/23/2023)   Depression (PHQ2-9)    PHQ-2 Score: 0  Alcohol Screen: Low Risk (10/19/2023)   Alcohol Screen    Last Alcohol Screening Score (AUDIT): 5  Housing: High Risk (10/19/2023)   Epic    Unable to Pay for Housing in the Last Year: Yes    Number of Times Moved in the Last Year: 0    Homeless in the Last Year: No  Utilities: Not on file  Health Literacy: Not on file   Family History  Problem Relation Age of Onset   Depression Mother    Bipolar disorder Brother    Colon cancer Maternal Grandfather        not sure of age   Breast cancer Neg Hx    Allergies  Allergen Reactions   Celecoxib Hives   Prednisone      intolerance, makes her angry. Makes her angry   Topamax  [Topiramate ]     Mood changes   Zolpidem     Mood changes   Codeine Rash   Penicillins Rash    TOLERATED CEFAZOLIN    Current Outpatient Medications  Medication Sig Dispense Refill   albuterol  (PROVENTIL ) (2.5 MG/3ML) 0.083% nebulizer solution Take 3 mLs (2.5 mg total) by nebulization every 6 (six) hours as needed for wheezing or shortness of breath 75 mL 0   Albuterol -Budesonide  (AIRSUPRA ) 90-80 MCG/ACT AERO Inhale 1-2 puffs into the lungs as needed (wheezing). 10.7 g 5   aspirin  EC 325 MG tablet Take 1 tablet (325 mg total) by mouth daily. 14 tablet 0   desvenlafaxine  (PRISTIQ ) 50 MG 24 hr tablet Take 1 tablet (50 mg total) by  mouth daily. 90 tablet 1   diclofenac  Sodium (VOLTAREN ) 1 % GEL Apply 2 grams topically 4 (four) times daily. 100 g 1   famotidine  (PEPCID ) 40 MG tablet Take 1 tablet (40 mg total) by mouth daily. 90 tablet 1   Fluticasone -Umeclidin-Vilant (TRELEGY ELLIPTA ) 200-62.5-25 MCG/ACT AEPB Inhale 1 puff into the lungs daily. 60 each 12   gabapentin  (NEURONTIN ) 300 MG capsule Take 3 capsules (900 mg total)  by mouth at bedtime. 270 capsule 1   Galcanezumab -gnlm (EMGALITY ) 120 MG/ML SOAJ Inject 1 mL into the skin every 30 (thirty) days. 1 mL 5   lidocaine  (LIDODERM ) 5 % Place 2 patches onto the skin daily. Remove & Discard patch within 12 hours or as directed by MD 60 patch 2   LORazepam  (ATIVAN ) 1 MG tablet Take 1 tablet (1 mg total) by mouth every 8 (eight) hours. 2 tablet 0   oxyCODONE  (ROXICODONE ) 5 MG immediate release tablet Take 1 tablet (5 mg total) by mouth every 4 (four) hours as needed for severe pain (pain score 7-10) or breakthrough pain. 20 tablet 0   oxyCODONE  (ROXICODONE ) 5 MG immediate release tablet Take 1 tablet (5 mg total) by mouth every 4 (four) hours as needed for severe pain (pain score 7-10) or breakthrough pain. 15 tablet 0   pantoprazole  (PROTONIX ) 40 MG tablet Take 1 tablet (40 mg total) by mouth daily. (Patient taking differently: Take 40 mg by mouth daily as needed.) 30 tablet 1   prazosin  (MINIPRESS ) 2 MG capsule Take 1 capsule (2 mg total) by mouth at bedtime. 90 capsule 1   No current facility-administered medications for this visit.   No results found.  Review of Systems:   A ROS was performed including pertinent positives and negatives as documented in the HPI.   Musculoskeletal Exam:    There were no vitals taken for this visit.  Right hip incisions are well-appearing without erythema or drainage.  Walks with a nonantalgic gait with a cane.  Just neurosensory exam is intact  Imaging:      I personally reviewed and interpreted the radiographs.   Assessment:    6 weeks status post right hip gluteus maximus tendon transfer doing extremely well.  At this time she does not have any specific restrictions and may begin working on abduction and strengthening.  I will plan to see her back in 6 weeks for reassessment Plan :    - Return to clinic 6 weeks for reassessment      I personally saw and evaluated the patient, and participated in the management and treatment plan.  Elspeth Parker, MD Attending Physician, Orthopedic Surgery  This document was dictated using Dragon voice recognition software. A reasonable attempt at proof reading has been made to minimize errors. "

## 2024-02-19 ENCOUNTER — Other Ambulatory Visit (HOSPITAL_BASED_OUTPATIENT_CLINIC_OR_DEPARTMENT_OTHER): Payer: Self-pay | Admitting: Orthopaedic Surgery

## 2024-02-19 DIAGNOSIS — M67951 Unspecified disorder of synovium and tendon, right thigh: Secondary | ICD-10-CM

## 2024-02-20 ENCOUNTER — Ambulatory Visit

## 2024-02-20 DIAGNOSIS — R262 Difficulty in walking, not elsewhere classified: Secondary | ICD-10-CM

## 2024-02-20 DIAGNOSIS — M25551 Pain in right hip: Secondary | ICD-10-CM

## 2024-02-20 DIAGNOSIS — M6281 Muscle weakness (generalized): Secondary | ICD-10-CM

## 2024-02-20 DIAGNOSIS — R2689 Other abnormalities of gait and mobility: Secondary | ICD-10-CM

## 2024-02-20 DIAGNOSIS — R269 Unspecified abnormalities of gait and mobility: Secondary | ICD-10-CM

## 2024-02-20 NOTE — Therapy (Addendum)
 " OUTPATIENT PHYSICAL THERAPY LOWER EXTREMITY TREATMENT    Patient Name: Shelby Henson MRN: [id number] DOB:1971-01-12, 54 y.o., female Today's Date: 02/20/2024  END OF SESSION:  PT End of Session - 02/20/24 0849     Visit Number 6    Number of Visits 29    Date for Recertification  05/07/24    Authorization Type [employer] [id number]    Progress Note Due on Visit 10    PT Start Time 0848    PT Stop Time 0920    PT Time Calculation (min) 32 min    Equipment Utilized During Treatment Gait belt    Activity Tolerance Patient tolerated treatment well    Behavior During Therapy WFL for tasks assessed/performed          Past Medical History:  Diagnosis Date   Anemia    Anxiety    Asthma    well controlled   Colon polyps    Complication of anesthesia    DDD (degenerative disc disease), lumbar    Depression    GERD (gastroesophageal reflux disease)    occ   Iron  deficiency anemia    Migraine    Morbid obesity (HCC)    Osteoarthritis    Osteoarthritis of right hip    PONV (postoperative nausea and vomiting)    with c-section x 1   Right rotator cuff tear 11/2022   Vitamin D  deficiency    Past Surgical History:  Procedure Laterality Date   BREAST CYST ASPIRATION Left    CESAREAN SECTION  1990   CESAREAN SECTION  1993   CESAREAN SECTION  1996   COLONOSCOPY N/A 09/22/2020   Procedure: COLONOSCOPY;  Surgeon: Janalyn Keene NOVAK, MD;  Location: [facility];  Service: Endoscopy;  Laterality: N/A;   LAPAROSCOPY  2018   LEEP  2005   benign pathology; Dr. Rolm   OPEN SURGICAL REPAIR OF GLUTEAL TENDON Right 12/17/2023   Procedure: REPAIR, TENDON, GLUTEUS MEDIUS, OPEN;  Surgeon: Genelle Standing, MD;  Location: [facility];  Service: Orthopedics;  Laterality: Right;  RIGHT GLUTEUS MAXIMUS TENDON TRANSFER   SHOULDER ACROMIOPLASTY Right 01/03/2023   Procedure: RIGHT SHOULDER ACROMIOPLASTY;  Surgeon: Genelle Standing, MD;  Location: [facility];  Service: Orthopedics;   Laterality: Right;   SHOULDER ARTHROSCOPY WITH ROTATOR CUFF REPAIR AND OPEN BICEPS TENODESIS Right 01/03/2023   Procedure: RIGHT SHOULDER ARTHROSCOPY WITH ROTATOR CUFF REPAIR AND  BICEPS TENODESIS;  Surgeon: Genelle Standing, MD;  Location: [facility];  Service: Orthopedics;  Laterality: Right;   SUPRACERVICAL ABDOMINAL HYSTERECTOMY  2010   leio/AUB; Dr. Rolm   TOTAL HIP ARTHROPLASTY Right 11/12/2018   Procedure: TOTAL HIP ARTHROPLASTY ANTERIOR APPROACH;  Surgeon: Leora Lynwood SAUNDERS, MD;  Location: [facility];  Service: Orthopedics;  Laterality: Right;   Patient Active Problem List   Diagnosis Date Noted   Tendinopathy of right gluteus medius 12/17/2023   Severe persistent asthma without complication (HCC) 03/29/2023   Nontraumatic incomplete tear of right rotator cuff 01/03/2023   Right rotator cuff tendonitis 09/11/2022   Iron  deficiency anemia due to dietary causes 08/10/2022   Chronic right shoulder pain 05/11/2022   Morbid obesity (HCC) 01/05/2022   Rotator cuff impingement syndrome, right 09/29/2021   Biceps tendinitis, right 09/29/2021   Meralgia paresthetica of right side 03/29/2021   Psoas tendinitis, right hip 03/29/2021   History of total right hip arthroplasty 03/29/2021   Status post total replacement of right hip 02/19/2021   Meralgia paresthetica of right side 02/19/2021   Special  screening for malignant neoplasms, colon    Polyp of ascending colon    Polyp of sigmoid colon    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 03/23/2020   Pain in right hip 01/12/2019   Vitamin D  deficiency 02/25/2018   Chronic pain 10/24/2015   Hot flashes 10/24/2015   Migraine without aura and without status migrainosus, not intractable 10/24/2015   Metabolic syndrome 10/24/2015   Dyslipidemia 10/24/2015   Allergic rhinitis, seasonal 05/17/2015   Chronic insomnia 08/08/2014   DDD (degenerative disc disease), lumbar 08/08/2014   Mood disorder 08/08/2014    Gastroesophageal reflux disease without esophagitis 08/08/2014   H/O total hysterectomy 08/08/2014   Irritable bowel syndrome with diarrhea 08/08/2014   Osteoarthritis of right hip 08/08/2014   Adult BMI 30+ 08/08/2014   SBO (spina bifida occulta) 08/08/2014    PCP: Dr. Dorette Loron  REFERRING PROVIDER: Dr. Elspeth Parker  REFERRING DIAG: Right hip gluteus medius tear, s/p Right hip gluteus maximus tendon transfer with gluteus medius repair.   THERAPY DIAG:  Muscle weakness (generalized)  Pain in right hip  Abnormality of gait and mobility  Difficulty in walking, not elsewhere classified  Imbalance  Rationale for Evaluation and Treatment: Rehabilitation  ONSET DATE: Surgery on 12/17/2023  SUBJECTIVE:   SUBJECTIVE STATEMENT: From Today: Patient reports feeling well overall. States MD very pleased with her progress. Rates pain at 1/10    PERTINENT HISTORY: Per Medical record from 10/25/2023 ortho surgery note:  I did discuss that her MRI does show evidence of full-thickness gluteus medius and minimus tear with significant muscle atrophy status post right total hip arthroplasty.  At this time she has weakness and pain.  She has tried physical therapy as well as injections in the past without relief.  Given this I did discuss surgical options including gluteus maximus tendon transfer which I do believe would be ideal given the chronicity and multiple injections that she has had in the past.  At this time she would like to pursue this and we will proceed with this  -Plan for right hip gluteus maximus tendon transfer PMH- See above section- Previous THR Right hip in 2020.    PAIN:  Are you having pain? Yes: NPRS scale: 7/10 R lateral hip pain Pain location:   Pain description: achy- constant Aggravating factors: Walking, prolonged sitting. Standing Relieving factors: rest, meds, Ice- polar care  PRECAUTIONS: Other: per protocol for Dr. Parker- avoid until 6 weeks:  passive adduction and ER, Active Abduction and IR  RED FLAGS: None   WEIGHT BEARING RESTRICTIONS: Yes RLE Weight bearing as tolerated  FALLS:  Has patient fallen in last 6 months? No  LIVING ENVIRONMENT: Lives with: lives with their family Lives in: Mobile home Stairs: Yes: External: 5 steps; can reach both Has following equipment at home: Single point cane and Walker - 2 wheeled  OCCUPATION: front office   PLOF: Independent  PATIENT GOALS: Stop limping, no pain, walk better.  NEXT MD VISIT: Wednesday 02/14/2024 - Dr. Parker  OBJECTIVE:  Note: Objective measures were completed at Evaluation unless otherwise noted.  DIAGNOSTIC FINDINGS:  Right hip MRI: There is a near full-thickness tear of the gluteus medius and minimus with evidence of chronic atrophy on the T1 view  PATIENT SURVEYS:  LEFS  Extreme difficulty/unable (0), Quite a bit of difficulty (1), Moderate difficulty (2), Little difficulty (3), No difficulty (4) Survey date:   12/24/23 02/13/2024  Any of your usual work, housework or school activities 0  2  2. Usual hobbies, recreational or sporting activities 0 2  3. Getting into/out of the bath 2 3  4. Walking between rooms 2 3  5. Putting on socks/shoes 1 2  6. Squatting  0 2  7. Lifting an object, like a bag of groceries from the floor 1 3  8. Performing light activities around your home 1 3  9. Performing heavy activities around your home 0 1  10. Getting into/out of a car 1 3  11. Walking 2 blocks 0 0  12. Walking 1 mile 0 0  13. Going up/down 10 stairs (1 flight) 1 2  14. Standing for 1 hour 0 1  15.  sitting for 1 hour 1 2  16. Running on even ground 0 0  17. Running on uneven ground 0 0  18. Making sharp turns while running fast 0 0  19. Hopping  0 0  20. Rolling over in bed 1 2  Score total:  11/80 31/80     COGNITION: Overall cognitive status: Within functional limits for tasks assessed     SENSATION: Light touch: WFL in both LE   EDEMA:   Observed swelling R hip vs. Left - did not measure today- Educated on use of ice as needed for swelling/pain.   MUSCLE LENGTH:   POSTURE: No Significant postural limitations  PALPATION: (+) tenderness- R lateral hip region along incision and general lateral aspect of upper leg.   LOWER EXTREMITY ROM:  Active ROM Right eval Left eval  Hip flexion Restricted to 90 deg   Hip extension    Hip abduction No active ABD active until week 6   Hip adduction No Passive ADD until week 6   Hip internal rotation No active IR until week 6   Hip external rotation No passive ER until week 6   Knee flexion    Knee extension    Ankle dorsiflexion    Ankle plantarflexion    Ankle inversion    Ankle eversion     (Blank rows = not tested)  LOWER EXTREMITY MMT:  MMT Right eval Left eval Right  02/13/24  Hip flexion 2- 5 3+  Hip extension Isometric today 5 3+  Hip abduction NT- due to precaution 5 3+  Hip adduction NT 5   Hip internal rotation NT 5   Hip external rotation NT 5   Knee flexion 3+ 5   Knee extension 2- 5   Ankle dorsiflexion 4 5   Ankle plantarflexion     Ankle inversion     Ankle eversion      (Blank rows = not tested)  LOWER EXTREMITY SPECIAL TESTS:  N/A  FUNCTIONAL TESTS:  10 meter walk test: 0.42 m/s with SPC on Left side  GAIT: Distance walked: approx 80 feet Assistive device utilized: Single point cane Level of assistance: SBA Comments: Patient initially using cane on R side. Educated on trying to use on Left side to off load some weight on RLE. Patient with significant less limping  after switching. She exhibited short step to gait, antalgic on Right with decreased step length and foot clearance.  TREATMENT DATE: 02/20/24       Therex:  Hip hike 2 x 10 reps  Standing single leg calf raises 2 x 10 reps Fwd step up onto 4  step x 10 reps each LE (min UE support for RLE) Lateral step up onto 4 step x 10 reps ea LE w/o UE support.  Hip circles  Fwd step over 1/2 foam x 15 Side step over 1/2 foam x 15 Tandem gait x 8 feet x 5 SLS- Hold up to 20 sec x 2 each LE  Self care/Home management:  Reivewed above activities and instructed that she could attempt for HEP- Will add handout with more activities next visit.  PATIENT EDUCATION:  Education details: PT plan of care; Specific details of Rehab protocol specific for Dr. Genelle; gait techniques and use of cane, Weight bearing and other precautions and specific exercises as detailed in protocol. Person educated: Patient and dtr Education method: Explanation, Demonstration, Tactile cues, Verbal cues, and Handouts Education comprehension: verbalized understanding, returned demonstration, verbal cues required, tactile cues required, and needs further education  HOME EXERCISE PROGRAM:  Access Code: [email or web address] URL: [email or web address] Date: 01/21/2024 Prepared by: Reyes London  Exercises - Hooklying Isometric Hip Abduction with Belt  - 1 x daily - 3 sets - 10 reps - Supine Bridge  - 1 x daily - 3 sets - 10 reps - Sit to Stand with Arms Crossed  - 3 x weekly - 3 sets - 10 reps - Standing Hip Extension with Counter Support  - 3 x weekly - 3 sets - 10 reps - Standing Heel Raises  - 3 x weekly - 3 sets - 10 reps     Access Code: [email or web address] URL: [email or web address] Date: 12/25/2023 Prepared by: Reyes London  Exercises - Supine Hip Abduction PROM with Caregiver   - 1 x daily - 3 sets - 10 reps - Supine Hip and Knee Flexion PROM with Caregiver  - 1 x daily - 3 sets - 10 reps - Supine Single Leg Hip Extension Isometric Into Table  - 1 x daily - 3 sets - 10 reps - 3-5 hold - Supine Isometric Hip Adduction with Pillow at Knees  - 1 x daily - 3 sets - 10 reps - 3-5 sec hold - Supine Isometric Hamstring Set  - 1 x daily - 3  sets - 10 reps - Supine Posterior Pelvic Tilt  - 1 x daily - 3 sets - 10 reps  ASSESSMENT:  CLINICAL IMPRESSION: Patient is s/p gluteal med tear and surgical repair and was recently upgraded to full activity as tolerated. She demonstrates excellent functional progress with no reported pain during or after todays session. Patient tolerated progression to more dynamic standing, single-limb strengthening, and balance activities without difficulty. She is able to perform step-ups, foam obstacle negotiation, tandem gait, and single-leg stance with good control, requiring only minimal upper extremity support during higher-demand tasks on the right lower extremity. Overall presentation indicates improving lower extremity strength, balance, and neuromuscular control with continued readiness for advanced functional activity progression. She will benefit from skilled PT services to address her healing, pain limited mobility, muscle weakness and impaired balance to help enable her to return to her previous level of function.   OBJECTIVE IMPAIRMENTS: Abnormal gait, decreased activity tolerance, decreased balance, decreased coordination, decreased endurance, decreased knowledge of use of DME, decreased mobility, difficulty walking, decreased ROM, decreased strength, increased edema, impaired flexibility, and pain.   ACTIVITY LIMITATIONS: carrying, lifting, bending, sitting, standing, squatting, sleeping,  stairs, transfers, and bed mobility  PARTICIPATION LIMITATIONS: meal prep, cleaning, laundry, driving, shopping, community activity, occupation, yard work, and church  PERSONAL FACTORS: 1 comorbidity: chronic pain are also affecting patient's functional outcome.   REHAB POTENTIAL: Good  CLINICAL DECISION MAKING: Stable/uncomplicated  EVALUATION COMPLEXITY: Low   GOALS: Goals reviewed with patient? Yes  SHORT TERM GOALS: Target date: 02/04/2024  Pt will be independent with HEP in order to decrease ankle  pain and increase strength in order to improve pain-free function at home and work.  Baseline: EVAL- NO FORMAL HEP IN PLACE; 02/13/2024= Patient knowledgeable of HEP and compliant with all activities.  Goal status: MET  2.  Patient will adhere to all prescribed precautions set by MD to reduce risk of re-injury or re-hospitalization.  Baseline: EVAL- Patient in learning phase of set precuations. 02/13/2024= Patient knowledgeable of all hip precautions and following protocol set in place by MD. Goal status: MET    LONG TERM GOALS: Target date: 05/07/2024  Pt will increase LEFS by at least 30 points in order to demonstrate significant improvement in lower extremity function.   Baseline: EVAL = 11/80; 02/13/2024= 31/80 Goal status: PROGRESSING  2.  Pt will decrease worst pain as reported on NPRS by at least 3 points in order to demonstrate clinically significant reduction in ankle/foot pain.  Baseline: EVAL= 10/10 at worst R hip; 02/13/2024= current= 2/10; at worst at night could be up to 7/10 Goal status: PROGRESSING  3.  Patient will present with normalized gait without an AD for short community and all household distances. Baseline: EVAL- Antalgic gait RLE with use of cane; 02/13/2024- Patient continues to have some at times and intermittently using her cane.  Goal status: PROGRESSING  4.  Pt will increase strength of R Hip  to within 1/2 MMT grade of uninvolved LE in order to demonstrate improvement in strength and function  Baseline: EVAL (See MMT chart); 02/13/2024= 3+ Right hip abd Goal status: INITIAL  5.  Patient will perform SLS test on RLE > 10 sec  Baseline: EVAL - Not appropriate with precautions at this time. Goal status: INITIAL    PLAN:  PT FREQUENCY: 1-2x/week  PT DURATION: 12 weeks  PLANNED INTERVENTIONS: [id number]- PT Re-evaluation, 97750- Physical Performance Testing, 97110-Therapeutic exercises, 97530- Therapeutic activity, [id number]- Neuromuscular re-education, 97535- Self  Care, [id number]- Manual therapy, [id number]- Gait training, [phone]- Orthotic Initial, [phone]- Orthotic/Prosthetic subsequent, [phone]- Canalith repositioning, [phone]- Aquatic Therapy, [phone]- Electrical stimulation (manual), [phone] (1-2 muscles), [phone] (3+ muscles)- Dry Needling, Patient/Family education, Balance training, Stair training, Taping, Joint mobilization, Scar mobilization, Compression bandaging, Vestibular training, DME instructions, Cryotherapy, and Moist heat  PLAN FOR NEXT SESSION:  Continue with protocol for weeks 6-10   Gait training with least restrictive AD   Reyes LOISE London, PT 02/20/2024, 12:04 PM  "

## 2024-02-25 ENCOUNTER — Encounter (HOSPITAL_BASED_OUTPATIENT_CLINIC_OR_DEPARTMENT_OTHER): Payer: Self-pay | Admitting: Orthopaedic Surgery

## 2024-02-28 ENCOUNTER — Other Ambulatory Visit (HOSPITAL_BASED_OUTPATIENT_CLINIC_OR_DEPARTMENT_OTHER): Payer: Self-pay

## 2024-02-28 ENCOUNTER — Ambulatory Visit (HOSPITAL_BASED_OUTPATIENT_CLINIC_OR_DEPARTMENT_OTHER): Admitting: Orthopaedic Surgery

## 2024-02-28 DIAGNOSIS — M67951 Unspecified disorder of synovium and tendon, right thigh: Secondary | ICD-10-CM

## 2024-02-28 MED ORDER — TRIAMCINOLONE ACETONIDE 40 MG/ML IJ SUSP
80.0000 mg | INTRAMUSCULAR | Status: AC | PRN
  Administered 2024-02-28: 80 mg via INTRA_ARTICULAR

## 2024-02-28 MED ORDER — METHYLPREDNISOLONE 4 MG PO TBPK
ORAL_TABLET | ORAL | 0 refills | Status: AC
  Filled 2024-02-28: qty 21, 6d supply, fill #0

## 2024-02-28 MED ORDER — LIDOCAINE HCL 1 % IJ SOLN
4.0000 mL | INTRAMUSCULAR | Status: AC | PRN
  Administered 2024-02-28: 4 mL

## 2024-02-28 NOTE — Progress Notes (Signed)
 "                                Post Operative Evaluation    Procedure/Date of Surgery: Right hip gluteus maximus tendon transfer 11/18  Interval History:    Presents 12 weeks status post right hip gluteus maximus tendon transfer.  He was improving significantly now with a recurrence of pain over the greater trochanteric region.  She is also experiencing pain about the posterior SI joint   PMH/PSH/Family History/Social History/Meds/Allergies:    Past Medical History:  Diagnosis Date   Anemia    Anxiety    Asthma    well controlled   Colon polyps    Complication of anesthesia    DDD (degenerative disc disease), lumbar    Depression    GERD (gastroesophageal reflux disease)    occ   Iron  deficiency anemia    Migraine    Morbid obesity (HCC)    Osteoarthritis    Osteoarthritis of right hip    PONV (postoperative nausea and vomiting)    with c-section x 1   Right rotator cuff tear 11/2022   Vitamin D  deficiency    Past Surgical History:  Procedure Laterality Date   BREAST CYST ASPIRATION Left    CESAREAN SECTION  1990   CESAREAN SECTION  1993   CESAREAN SECTION  1996   COLONOSCOPY N/A 09/22/2020   Procedure: COLONOSCOPY;  Surgeon: Janalyn Keene NOVAK, MD;  Location: [facility];  Service: Endoscopy;  Laterality: N/A;   LAPAROSCOPY  2018   LEEP  2005   benign pathology; Dr. Rolm   OPEN SURGICAL REPAIR OF GLUTEAL TENDON Right 12/17/2023   Procedure: REPAIR, TENDON, GLUTEUS MEDIUS, OPEN;  Surgeon: Genelle Standing, MD;  Location: [facility];  Service: Orthopedics;  Laterality: Right;  RIGHT GLUTEUS MAXIMUS TENDON TRANSFER   SHOULDER ACROMIOPLASTY Right 01/03/2023   Procedure: RIGHT SHOULDER ACROMIOPLASTY;  Surgeon: Genelle Standing, MD;  Location: [facility];  Service: Orthopedics;  Laterality: Right;   SHOULDER ARTHROSCOPY WITH ROTATOR CUFF REPAIR AND OPEN BICEPS TENODESIS Right 01/03/2023   Procedure: RIGHT SHOULDER  ARTHROSCOPY WITH ROTATOR CUFF REPAIR AND  BICEPS TENODESIS;  Surgeon: Genelle Standing, MD;  Location: [facility];  Service: Orthopedics;  Laterality: Right;   SUPRACERVICAL ABDOMINAL HYSTERECTOMY  2010   leio/AUB; Dr. Rolm   TOTAL HIP ARTHROPLASTY Right 11/12/2018   Procedure: TOTAL HIP ARTHROPLASTY ANTERIOR APPROACH;  Surgeon: Leora Lynwood SAUNDERS, MD;  Location: [facility];  Service: Orthopedics;  Laterality: Right;   Social History   Socioeconomic History   Marital status: Divorced    Spouse name: Not on file   Number of children: 3   Years of education: Not on file   Highest education level: Associate degree: occupational, scientist, product/process development, or vocational program  Occupational History   Not on file  Tobacco Use   Smoking status: Never   Smokeless tobacco: Never  Vaping Use   Vaping status: Never Used  Substance and Sexual Activity   Alcohol use: Yes    Alcohol/week: 0.0 standard drinks of alcohol    Comment: occasionally   Drug use: No   Sexual activity: Yes    Birth control/protection: Surgical    Comment: Hysterectomy  Other Topics Concern   Not on file  Social History Narrative   Not on file   Social Drivers of Health   Tobacco Use: Low Risk (02/20/2024)   Patient History    Smoking  Tobacco Use: Never    Smokeless Tobacco Use: Never    Passive Exposure: Not on file  Financial Resource Strain: Low Risk (10/19/2023)   Overall Financial Resource Strain (CARDIA)    Difficulty of Paying Living Expenses: Not very hard  Food Insecurity: Patient Declined (10/19/2023)   Epic    Worried About Programme Researcher, Broadcasting/film/video in the Last Year: Patient declined    Barista in the Last Year: Patient declined  Transportation Needs: No Transportation Needs (10/19/2023)   Epic    Lack of Transportation (Medical): No    Lack of Transportation (Non-Medical): No  Physical Activity: Inactive (10/19/2023)   Exercise Vital Sign    Days of Exercise per Week: 0 days    Minutes  of Exercise per Session: Not on file  Stress: Stress Concern Present (10/19/2023)   [employer] of Occupational Health - Occupational Stress Questionnaire    Feeling of Stress: Rather much  Social Connections: Socially Isolated (10/19/2023)   Social Connection and Isolation Panel    Frequency of Communication with Friends and Family: More than three times a week    Frequency of Social Gatherings with Friends and Family: Three times a week    Attends Religious Services: Patient declined    Active Member of Clubs or Organizations: No    Attends Banker Meetings: Not on file    Marital Status: Divorced  Depression (PHQ2-9): Low Risk (10/23/2023)   Depression (PHQ2-9)    PHQ-2 Score: 0  Alcohol Screen: Low Risk (10/19/2023)   Alcohol Screen    Last Alcohol Screening Score (AUDIT): 5  Housing: High Risk (10/19/2023)   Epic    Unable to Pay for Housing in the Last Year: Yes    Number of Times Moved in the Last Year: 0    Homeless in the Last Year: No  Utilities: Not on file  Health Literacy: Not on file   Family History  Problem Relation Age of Onset   Depression Mother    Bipolar disorder Brother    Colon cancer Maternal Grandfather        not sure of age   Breast cancer Neg Hx    Allergies  Allergen Reactions   Celecoxib Hives   Prednisone      intolerance, makes her angry. Makes her angry   Topamax  [Topiramate ]     Mood changes   Zolpidem     Mood changes   Codeine Rash   Penicillins Rash    TOLERATED CEFAZOLIN    Current Outpatient Medications  Medication Sig Dispense Refill   methylPREDNISolone  (MEDROL  DOSEPAK) 4 MG TBPK tablet Take per packet instructions 21 each 0   albuterol  (PROVENTIL ) (2.5 MG/3ML) 0.083% nebulizer solution Take 3 mLs (2.5 mg total) by nebulization every 6 (six) hours as needed for wheezing or shortness of breath 75 mL 0   Albuterol -Budesonide  (AIRSUPRA ) 90-80 MCG/ACT AERO Inhale 1-2 puffs into the lungs as  needed (wheezing). 10.7 g 5   aspirin  EC 325 MG tablet Take 1 tablet (325 mg total) by mouth daily. 14 tablet 0   desvenlafaxine  (PRISTIQ ) 50 MG 24 hr tablet Take 1 tablet (50 mg total) by mouth daily. 90 tablet 1   diclofenac  Sodium (VOLTAREN ) 1 % GEL Apply 2 grams topically 4 (four) times daily. 100 g 1   famotidine  (PEPCID ) 40 MG tablet Take 1 tablet (40 mg total) by mouth daily. 90 tablet 1   Fluticasone -Umeclidin-Vilant (TRELEGY ELLIPTA ) 200-62.5-25 MCG/ACT AEPB Inhale 1 puff into the  lungs daily. 60 each 12   gabapentin  (NEURONTIN ) 300 MG capsule Take 3 capsules (900 mg total) by mouth at bedtime. 270 capsule 1   Galcanezumab -gnlm (EMGALITY ) 120 MG/ML SOAJ Inject 1 mL into the skin every 30 (thirty) days. 1 mL 5   lidocaine  (LIDODERM ) 5 % Place 2 patches onto the skin daily. Remove & Discard patch within 12 hours or as directed by MD 60 patch 2   LORazepam  (ATIVAN ) 1 MG tablet Take 1 tablet (1 mg total) by mouth every 8 (eight) hours. 2 tablet 0   oxyCODONE  (ROXICODONE ) 5 MG immediate release tablet Take 1 tablet (5 mg total) by mouth every 4 (four) hours as needed for severe pain (pain score 7-10) or breakthrough pain. 20 tablet 0   oxyCODONE  (ROXICODONE ) 5 MG immediate release tablet Take 1 tablet (5 mg total) by mouth every 4 (four) hours as needed for severe pain (pain score 7-10) or breakthrough pain. 15 tablet 0   pantoprazole  (PROTONIX ) 40 MG tablet Take 1 tablet (40 mg total) by mouth daily. (Patient taking differently: Take 40 mg by mouth daily as needed.) 30 tablet 1   prazosin  (MINIPRESS ) 2 MG capsule Take 1 capsule (2 mg total) by mouth at bedtime. 90 capsule 1   No current facility-administered medications for this visit.   No results found.  Review of Systems:   A ROS was performed including pertinent positives and negatives as documented in the HPI.   Musculoskeletal Exam:    There were no vitals taken for this visit.  Right hip incisions are  well-appearing without erythema or drainage.  Walks with a nonantalgic gait with a cane.  Just neurosensory exam is intact  Imaging:      I personally reviewed and interpreted the radiographs.   Assessment:   12  weeks status post right hip gluteus maximus tendon transfer over the trochanteric and SI region.  At this visit I did recommend an ultrasound-guided injection to help her with postinflammatory pain over the trochanter.  Will plan for referral to Dr. Burnetta for an SI ultrasound-guided injection Plan :    - Return to clinic 12 weeks for reassessment    Procedure Note  Patient: ALIJAH HYDE             Date of Birth: 19-Mar-1970           MRN: [id number]             Visit Date: 02/28/2024  Procedures: Visit Diagnoses:  1. Tendinopathy of right gluteus medius     Large Joint Inj: R greater trochanter on 02/28/2024 3:07 PM Indications: pain Details: 22 G 3.5 in needle, ultrasound-guided anterolateral approach  Arthrogram: No  Medications: 4 mL lidocaine  1 %; 80 mg triamcinolone  acetonide 40 MG/ML Outcome: tolerated well, no immediate complications Procedure, treatment alternatives, risks and benefits explained, specific risks discussed. Consent was given by the patient. Immediately prior to procedure a time out was called to verify the correct patient, procedure, equipment, support staff and site/side marked as required. Patient was prepped and draped in the usual sterile fashion.          I personally saw and evaluated the patient, and participated in the management and treatment plan.  Elspeth Parker, MD Attending Physician, Orthopedic Surgery  This document was dictated using Dragon voice recognition software. A reasonable attempt at proof reading has been made to minimize errors. "

## 2024-02-28 NOTE — Addendum Note (Signed)
 Addended by: WOLFGANG CONLEY HERO on: 02/28/2024 03:36 PM   Modules accepted: Orders

## 2024-02-29 ENCOUNTER — Encounter (HOSPITAL_BASED_OUTPATIENT_CLINIC_OR_DEPARTMENT_OTHER): Payer: Self-pay | Admitting: Orthopaedic Surgery

## 2024-03-03 ENCOUNTER — Other Ambulatory Visit: Payer: Self-pay

## 2024-03-12 ENCOUNTER — Ambulatory Visit: Admitting: Student in an Organized Health Care Education/Training Program

## 2024-03-13 ENCOUNTER — Encounter: Admitting: Sports Medicine

## 2024-04-17 ENCOUNTER — Ambulatory Visit (HOSPITAL_BASED_OUTPATIENT_CLINIC_OR_DEPARTMENT_OTHER): Admitting: Orthopaedic Surgery

## 2024-04-24 ENCOUNTER — Ambulatory Visit: Admitting: Family Medicine
